# Patient Record
Sex: Male | Born: 1995 | Race: White | Hispanic: No | Marital: Single | Smoking: Never smoker
Health system: Southern US, Community
[De-identification: ages and names within clinical notes are randomized; demographics above are authoritative.]

## PROBLEM LIST (undated history)

## (undated) DIAGNOSIS — C719 Malignant neoplasm of brain, unspecified: Secondary | ICD-10-CM

---

## 2018-10-13 ENCOUNTER — Inpatient Hospital Stay (HOSPITAL_COMMUNITY): Payer: 59 | Admitting: Certified Registered Nurse Anesthetist

## 2018-10-13 ENCOUNTER — Emergency Department (HOSPITAL_COMMUNITY): Payer: 59

## 2018-10-13 ENCOUNTER — Inpatient Hospital Stay (HOSPITAL_COMMUNITY)
Admission: EM | Admit: 2018-10-13 | Discharge: 2018-10-21 | DRG: 025 | Disposition: A | Discharge status: Rehabilitation Facility | Payer: 59 | Attending: Neurological Surgery | Admitting: Neurological Surgery

## 2018-10-13 ENCOUNTER — Inpatient Hospital Stay (HOSPITAL_COMMUNITY): Payer: 59

## 2018-10-13 ENCOUNTER — Encounter (HOSPITAL_COMMUNITY)
Admission: EM | Disposition: A | Discharge status: Rehabilitation Facility | Payer: Self-pay | Source: Home / Self Care | Attending: Neurological Surgery

## 2018-10-13 ENCOUNTER — Encounter (HOSPITAL_COMMUNITY): Payer: Self-pay

## 2018-10-13 DIAGNOSIS — R29810 Facial weakness: Secondary | ICD-10-CM | POA: Diagnosis present

## 2018-10-13 DIAGNOSIS — Z23 Encounter for immunization: Secondary | ICD-10-CM

## 2018-10-13 DIAGNOSIS — G9389 Other specified disorders of brain: Secondary | ICD-10-CM | POA: Diagnosis not present

## 2018-10-13 DIAGNOSIS — Z20828 Contact with and (suspected) exposure to other viral communicable diseases: Secondary | ICD-10-CM | POA: Diagnosis present

## 2018-10-13 DIAGNOSIS — G939 Disorder of brain, unspecified: Secondary | ICD-10-CM | POA: Clinically undetermined

## 2018-10-13 DIAGNOSIS — R471 Dysarthria and anarthria: Secondary | ICD-10-CM | POA: Diagnosis present

## 2018-10-13 DIAGNOSIS — G936 Cerebral edema: Secondary | ICD-10-CM | POA: Diagnosis present

## 2018-10-13 DIAGNOSIS — R519 Headache, unspecified: Secondary | ICD-10-CM | POA: Diagnosis not present

## 2018-10-13 DIAGNOSIS — G8191 Hemiplegia, unspecified affecting right dominant side: Secondary | ICD-10-CM | POA: Diagnosis present

## 2018-10-13 DIAGNOSIS — I629 Nontraumatic intracranial hemorrhage, unspecified: Secondary | ICD-10-CM

## 2018-10-13 DIAGNOSIS — Z79899 Other long term (current) drug therapy: Secondary | ICD-10-CM

## 2018-10-13 DIAGNOSIS — H4902 Third [oculomotor] nerve palsy, left eye: Secondary | ICD-10-CM | POA: Diagnosis present

## 2018-10-13 DIAGNOSIS — R7401 Elevation of levels of liver transaminase levels: Secondary | ICD-10-CM | POA: Diagnosis not present

## 2018-10-13 DIAGNOSIS — R4701 Aphasia: Secondary | ICD-10-CM | POA: Diagnosis present

## 2018-10-13 DIAGNOSIS — I639 Cerebral infarction, unspecified: Secondary | ICD-10-CM | POA: Diagnosis not present

## 2018-10-13 DIAGNOSIS — C711 Malignant neoplasm of frontal lobe: Secondary | ICD-10-CM | POA: Diagnosis not present

## 2018-10-13 DIAGNOSIS — R202 Paresthesia of skin: Secondary | ICD-10-CM | POA: Diagnosis not present

## 2018-10-13 DIAGNOSIS — R066 Hiccough: Secondary | ICD-10-CM | POA: Diagnosis present

## 2018-10-13 DIAGNOSIS — D496 Neoplasm of unspecified behavior of brain: Secondary | ICD-10-CM | POA: Diagnosis not present

## 2018-10-13 DIAGNOSIS — J9601 Acute respiratory failure with hypoxia: Secondary | ICD-10-CM

## 2018-10-13 DIAGNOSIS — E871 Hypo-osmolality and hyponatremia: Secondary | ICD-10-CM | POA: Diagnosis not present

## 2018-10-13 DIAGNOSIS — R739 Hyperglycemia, unspecified: Secondary | ICD-10-CM | POA: Diagnosis not present

## 2018-10-13 DIAGNOSIS — R531 Weakness: Secondary | ICD-10-CM | POA: Diagnosis not present

## 2018-10-13 DIAGNOSIS — I611 Nontraumatic intracerebral hemorrhage in hemisphere, cortical: Secondary | ICD-10-CM | POA: Diagnosis not present

## 2018-10-13 DIAGNOSIS — I619 Nontraumatic intracerebral hemorrhage, unspecified: Secondary | ICD-10-CM | POA: Diagnosis present

## 2018-10-13 DIAGNOSIS — R1310 Dysphagia, unspecified: Secondary | ICD-10-CM | POA: Diagnosis present

## 2018-10-13 DIAGNOSIS — C712 Malignant neoplasm of temporal lobe: Principal | ICD-10-CM | POA: Diagnosis present

## 2018-10-13 DIAGNOSIS — T380X5A Adverse effect of glucocorticoids and synthetic analogues, initial encounter: Secondary | ICD-10-CM | POA: Diagnosis not present

## 2018-10-13 DIAGNOSIS — D72828 Other elevated white blood cell count: Secondary | ICD-10-CM | POA: Diagnosis not present

## 2018-10-13 DIAGNOSIS — R4781 Slurred speech: Secondary | ICD-10-CM | POA: Diagnosis not present

## 2018-10-13 DIAGNOSIS — J9600 Acute respiratory failure, unspecified whether with hypoxia or hypercapnia: Secondary | ICD-10-CM | POA: Diagnosis not present

## 2018-10-13 DIAGNOSIS — I69151 Hemiplegia and hemiparesis following nontraumatic intracerebral hemorrhage affecting right dominant side: Secondary | ICD-10-CM | POA: Diagnosis not present

## 2018-10-13 DIAGNOSIS — Z01818 Encounter for other preprocedural examination: Secondary | ICD-10-CM

## 2018-10-13 DIAGNOSIS — G8110 Spastic hemiplegia affecting unspecified side: Secondary | ICD-10-CM | POA: Diagnosis not present

## 2018-10-13 DIAGNOSIS — M7989 Other specified soft tissue disorders: Secondary | ICD-10-CM | POA: Diagnosis not present

## 2018-10-13 DIAGNOSIS — G8111 Spastic hemiplegia affecting right dominant side: Secondary | ICD-10-CM | POA: Diagnosis present

## 2018-10-13 DIAGNOSIS — R1312 Dysphagia, oropharyngeal phase: Secondary | ICD-10-CM | POA: Diagnosis not present

## 2018-10-13 HISTORY — PX: CRANIOTOMY: SHX93

## 2018-10-13 LAB — POCT I-STAT 7, (LYTES, BLD GAS, ICA,H+H)
Acid-base deficit: 5 mmol/L — ABNORMAL HIGH (ref 0.0–2.0)
Acid-base deficit: 5 mmol/L — ABNORMAL HIGH (ref 0.0–2.0)
Bicarbonate: 19.9 mmol/L — ABNORMAL LOW (ref 20.0–28.0)
Bicarbonate: 18.8 mmol/L — ABNORMAL LOW (ref 20.0–28.0)
Calcium, Ion: 1.22 mmol/L (ref 1.15–1.40)
Calcium, Ion: 1.21 mmol/L (ref 1.15–1.40)
HCT: 35 % — ABNORMAL LOW (ref 39.0–52.0)
HCT: 36 % — ABNORMAL LOW (ref 39.0–52.0)
Hemoglobin: 11.9 g/dL — ABNORMAL LOW (ref 13.0–17.0)
Hemoglobin: 12.2 g/dL — ABNORMAL LOW (ref 13.0–17.0)
O2 Saturation: 100 %
O2 Saturation: 100 %
Patient temperature: 100.1
Patient temperature: 99.5
Potassium: 4.3 mmol/L (ref 3.5–5.1)
Potassium: 4.1 mmol/L (ref 3.5–5.1)
Sodium: 138 mmol/L (ref 135–145)
Sodium: 140 mmol/L (ref 135–145)
TCO2: 21 mmol/L — ABNORMAL LOW (ref 22–32)
TCO2: 20 mmol/L — ABNORMAL LOW (ref 22–32)
pCO2 arterial: 38.8 mmHg (ref 32.0–48.0)
pCO2 arterial: 29.5 mmHg — ABNORMAL LOW (ref 32.0–48.0)
pH, Arterial: 7.322 — ABNORMAL LOW (ref 7.350–7.450)
pH, Arterial: 7.415 (ref 7.350–7.450)
pO2, Arterial: 311 mmHg — ABNORMAL HIGH (ref 83.0–108.0)
pO2, Arterial: 201 mmHg — ABNORMAL HIGH (ref 83.0–108.0)

## 2018-10-13 LAB — ABO/RH: ABO/RH(D): O NEG

## 2018-10-13 LAB — CBC
HCT: 47.6 % (ref 39.0–52.0)
Hemoglobin: 15.9 g/dL (ref 13.0–17.0)
MCH: 29.6 pg (ref 26.0–34.0)
MCHC: 33.4 g/dL (ref 30.0–36.0)
MCV: 88.5 fL (ref 80.0–100.0)
Platelets: 186 10*3/uL (ref 150–400)
RBC: 5.38 MIL/uL (ref 4.22–5.81)
RDW: 12.4 % (ref 11.5–15.5)
WBC: 12.6 10*3/uL — ABNORMAL HIGH (ref 4.0–10.5)
nRBC: 0 % (ref 0.0–0.2)

## 2018-10-13 LAB — COMPREHENSIVE METABOLIC PANEL
ALT: 18 U/L (ref 0–44)
AST: 20 U/L (ref 15–41)
Albumin: 4.1 g/dL (ref 3.5–5.0)
Alkaline Phosphatase: 56 U/L (ref 38–126)
Anion gap: 10 (ref 5–15)
BUN: 20 mg/dL (ref 6–20)
CO2: 26 mmol/L (ref 22–32)
Calcium: 9.1 mg/dL (ref 8.9–10.3)
Chloride: 101 mmol/L (ref 98–111)
Creatinine, Ser: 1.02 mg/dL (ref 0.61–1.24)
GFR calc Af Amer: >60 mL/min (ref >60)
GFR calc non Af Amer: >60 mL/min (ref >60)
Glucose, Bld: 118 mg/dL — ABNORMAL HIGH (ref 70–99)
Potassium: 4.4 mmol/L (ref 3.5–5.1)
Sodium: 137 mmol/L (ref 135–145)
Total Bilirubin: 0.5 mg/dL (ref 0.3–1.2)
Total Protein: 7.3 g/dL (ref 6.5–8.1)

## 2018-10-13 LAB — I-STAT CHEM 8, ED
BUN: 23 mg/dL — ABNORMAL HIGH (ref 6–20)
Calcium, Ion: 1.15 mmol/L (ref 1.15–1.40)
Chloride: 102 mmol/L (ref 98–111)
Creatinine, Ser: 0.9 mg/dL (ref 0.61–1.24)
Glucose, Bld: 110 mg/dL — ABNORMAL HIGH (ref 70–99)
HCT: 48 % (ref 39.0–52.0)
Hemoglobin: 16.3 g/dL (ref 13.0–17.0)
Potassium: 4.3 mmol/L (ref 3.5–5.1)
Sodium: 138 mmol/L (ref 135–145)
TCO2: 25 mmol/L (ref 22–32)

## 2018-10-13 LAB — DIFFERENTIAL
Abs Immature Granulocytes: 0.07 10*3/uL (ref 0.00–0.07)
Basophils Absolute: 0 10*3/uL (ref 0.0–0.1)
Basophils Relative: 0 %
Eosinophils Absolute: 0.1 10*3/uL (ref 0.0–0.5)
Eosinophils Relative: 1 %
Immature Granulocytes: 1 %
Lymphocytes Relative: 9 %
Lymphs Abs: 1.2 10*3/uL (ref 0.7–4.0)
Monocytes Absolute: 0.6 10*3/uL (ref 0.1–1.0)
Monocytes Relative: 5 %
Neutro Abs: 10.7 10*3/uL — ABNORMAL HIGH (ref 1.7–7.7)
Neutrophils Relative %: 84 %

## 2018-10-13 LAB — APTT: aPTT: 25 seconds (ref 24–36)

## 2018-10-13 LAB — PROTIME-INR
INR: 1.1 (ref 0.8–1.2)
Prothrombin Time: 13.7 seconds (ref 11.4–15.2)

## 2018-10-13 LAB — SARS CORONAVIRUS 2 BY RT PCR (HOSPITAL ORDER, PERFORMED IN ~~LOC~~ HOSPITAL LAB): SARS Coronavirus 2: NEGATIVE

## 2018-10-13 LAB — SURGICAL PCR SCREEN
MRSA, PCR: NEGATIVE
Staphylococcus aureus: NEGATIVE

## 2018-10-13 LAB — TYPE AND SCREEN
ABO/RH(D): O NEG
Antibody Screen: NEGATIVE

## 2018-10-13 LAB — ETHANOL: Alcohol, Ethyl (B): <10 mg/dL (ref <10)

## 2018-10-13 LAB — TRIGLYCERIDES: Triglycerides: 31 mg/dL (ref <150)

## 2018-10-13 LAB — CBG MONITORING, ED: Glucose-Capillary: 139 mg/dL — ABNORMAL HIGH (ref 70–99)

## 2018-10-13 SURGERY — CRANIOTOMY HEMATOMA EVACUATION SUBDURAL
Anesthesia: General | Site: Head | Laterality: Left

## 2018-10-13 MED ORDER — PROPOFOL 1000 MG/100ML IV EMUL
5.0000 ug/kg/min | INTRAVENOUS | Status: DC
  Administered 2018-10-13: 10 ug/kg/min via INTRAVENOUS

## 2018-10-13 MED ORDER — LABETALOL HCL 5 MG/ML IV SOLN
10.0000 mg | INTRAVENOUS | Status: DC | PRN
  Administered 2018-10-13: 20 mg via INTRAVENOUS
  Filled 2018-10-13: qty 8

## 2018-10-13 MED ORDER — ONDANSETRON HCL 4 MG PO TABS: 4.0000 mg | ORAL_TABLET | ORAL | Status: DC | PRN

## 2018-10-13 MED ORDER — IOHEXOL 350 MG/ML SOLN
50.0000 mL | Freq: Once | INTRAVENOUS | Status: AC | PRN
  Administered 2018-10-13: 50 mL via INTRAVENOUS

## 2018-10-13 MED ORDER — DEXAMETHASONE SODIUM PHOSPHATE 4 MG/ML IJ SOLN: 4.0000 mg | Freq: Three times a day (TID) | INTRAMUSCULAR | Status: DC

## 2018-10-13 MED ORDER — SODIUM CHLORIDE 0.9 % IV SOLN
INTRAVENOUS | Status: DC
  Administered 2018-10-13: 13:00:00 via INTRAVENOUS

## 2018-10-13 MED ORDER — DEXAMETHASONE SODIUM PHOSPHATE 10 MG/ML IJ SOLN
10.0000 mg | Freq: Once | INTRAMUSCULAR | Status: AC
  Administered 2018-10-13: 10 mg via INTRAVENOUS
  Filled 2018-10-13: qty 1

## 2018-10-13 MED ORDER — ACETAMINOPHEN 10 MG/ML IV SOLN
INTRAVENOUS | Status: AC
  Filled 2018-10-13: qty 100

## 2018-10-13 MED ORDER — SODIUM CHLORIDE 0.9 % IV SOLN
INTRAVENOUS | Status: DC | PRN
  Administered 2018-10-13: 20 ug/min via INTRAVENOUS

## 2018-10-13 MED ORDER — ONDANSETRON HCL 4 MG/2ML IJ SOLN
INTRAMUSCULAR | Status: DC | PRN
  Administered 2018-10-13 (×2): 4 mg via INTRAVENOUS

## 2018-10-13 MED ORDER — SODIUM CHLORIDE 0.9 % IV SOLN: INTRAVENOUS | Status: DC

## 2018-10-13 MED ORDER — FENTANYL 2500MCG IN NS 250ML (10MCG/ML) PREMIX INFUSION
0.0000 ug/h | INTRAVENOUS | Status: DC
  Administered 2018-10-13: 100 ug/h via INTRAVENOUS
  Filled 2018-10-13: qty 250

## 2018-10-13 MED ORDER — PROMETHAZINE HCL 25 MG/ML IJ SOLN
12.5000 mg | Freq: Once | INTRAMUSCULAR | Status: AC
  Administered 2018-10-13: 12.5 mg via INTRAVENOUS
  Filled 2018-10-13: qty 1

## 2018-10-13 MED ORDER — MIDAZOLAM HCL 2 MG/2ML IJ SOLN
INTRAMUSCULAR | Status: AC
  Filled 2018-10-13: qty 2

## 2018-10-13 MED ORDER — MANNITOL 25 % IV SOLN
70.0000 g | Status: AC
  Administered 2018-10-13: 70 g via INTRAVENOUS
  Filled 2018-10-13: qty 280

## 2018-10-13 MED ORDER — FENTANYL CITRATE (PF) 250 MCG/5ML IJ SOLN
INTRAMUSCULAR | Status: AC
  Filled 2018-10-13: qty 5

## 2018-10-13 MED ORDER — ROCURONIUM BROMIDE 10 MG/ML (PF) SYRINGE
PREFILLED_SYRINGE | INTRAVENOUS | Status: AC
  Filled 2018-10-13: qty 10

## 2018-10-13 MED ORDER — PROMETHAZINE HCL 25 MG PO TABS: 12.5000 mg | ORAL_TABLET | ORAL | Status: DC | PRN

## 2018-10-13 MED ORDER — DEXAMETHASONE SODIUM PHOSPHATE 10 MG/ML IJ SOLN
10.0000 mg | Freq: Four times a day (QID) | INTRAMUSCULAR | Status: DC
  Administered 2018-10-13 – 2018-10-15 (×7): 10 mg via INTRAVENOUS
  Filled 2018-10-13 (×6): qty 1

## 2018-10-13 MED ORDER — ROCURONIUM BROMIDE 10 MG/ML (PF) SYRINGE
PREFILLED_SYRINGE | INTRAVENOUS | Status: DC | PRN
  Administered 2018-10-13 (×2): 20 mg via INTRAVENOUS
  Administered 2018-10-13: 40 mg via INTRAVENOUS
  Administered 2018-10-13: 20 mg via INTRAVENOUS

## 2018-10-13 MED ORDER — SODIUM CHLORIDE 0.9 % IV SOLN
INTRAVENOUS | Status: DC | PRN
  Administered 2018-10-13: 500 mL

## 2018-10-13 MED ORDER — CHLORHEXIDINE GLUCONATE 0.12% ORAL RINSE (MEDLINE KIT)
15.0000 mL | Freq: Two times a day (BID) | OROMUCOSAL | Status: DC
  Administered 2018-10-13 – 2018-10-14 (×2): 15 mL via OROMUCOSAL

## 2018-10-13 MED ORDER — SUCCINYLCHOLINE CHLORIDE 200 MG/10ML IV SOSY
PREFILLED_SYRINGE | INTRAVENOUS | Status: DC | PRN
  Administered 2018-10-13: 130 mg via INTRAVENOUS

## 2018-10-13 MED ORDER — PROPOFOL 10 MG/ML IV BOLUS
INTRAVENOUS | Status: DC | PRN
  Administered 2018-10-13: 150 mg via INTRAVENOUS
  Administered 2018-10-13: 50 mg via INTRAVENOUS

## 2018-10-13 MED ORDER — ONDANSETRON HCL 4 MG/2ML IJ SOLN
INTRAMUSCULAR | Status: AC
  Filled 2018-10-13: qty 2

## 2018-10-13 MED ORDER — GADOBUTROL 1 MMOL/ML IV SOLN
7.0000 mL | Freq: Once | INTRAVENOUS | Status: AC | PRN
  Administered 2018-10-13: 7 mL via INTRAVENOUS

## 2018-10-13 MED ORDER — ESMOLOL HCL 100 MG/10ML IV SOLN
INTRAVENOUS | Status: AC
  Filled 2018-10-13: qty 10

## 2018-10-13 MED ORDER — PROPOFOL 10 MG/ML IV BOLUS
INTRAVENOUS | Status: AC
  Filled 2018-10-13: qty 20

## 2018-10-13 MED ORDER — ORAL CARE MOUTH RINSE
15.0000 mL | OROMUCOSAL | Status: DC
  Administered 2018-10-13 – 2018-10-14 (×7): 15 mL via OROMUCOSAL

## 2018-10-13 MED ORDER — THROMBIN 20000 UNITS EX SOLR
CUTANEOUS | Status: AC
  Filled 2018-10-13: qty 20000

## 2018-10-13 MED ORDER — LIDOCAINE 2% (20 MG/ML) 5 ML SYRINGE
INTRAMUSCULAR | Status: AC
  Filled 2018-10-13: qty 5

## 2018-10-13 MED ORDER — THROMBIN 5000 UNITS EX SOLR
CUTANEOUS | Status: AC
  Filled 2018-10-13: qty 5000

## 2018-10-13 MED ORDER — SUCCINYLCHOLINE CHLORIDE 200 MG/10ML IV SOSY
PREFILLED_SYRINGE | INTRAVENOUS | Status: AC
  Filled 2018-10-13: qty 10

## 2018-10-13 MED ORDER — ALBUMIN HUMAN 5 % IV SOLN
INTRAVENOUS | Status: DC | PRN
  Administered 2018-10-13: 17:00:00 via INTRAVENOUS

## 2018-10-13 MED ORDER — BACITRACIN ZINC 500 UNIT/GM EX OINT
TOPICAL_OINTMENT | CUTANEOUS | Status: AC
  Filled 2018-10-13: qty 28.35

## 2018-10-13 MED ORDER — CEFAZOLIN SODIUM-DEXTROSE 2-3 GM-%(50ML) IV SOLR
INTRAVENOUS | Status: DC | PRN
  Administered 2018-10-13: 2 g via INTRAVENOUS

## 2018-10-13 MED ORDER — DEXAMETHASONE SODIUM PHOSPHATE 4 MG/ML IJ SOLN: 4.0000 mg | Freq: Four times a day (QID) | INTRAMUSCULAR | Status: DC

## 2018-10-13 MED ORDER — THROMBIN 5000 UNITS EX SOLR
OROMUCOSAL | Status: DC | PRN
  Administered 2018-10-13: 5 mL via TOPICAL

## 2018-10-13 MED ORDER — SODIUM CHLORIDE 0.9 % IV SOLN
INTRAVENOUS | Status: DC
  Administered 2018-10-13 – 2018-10-17 (×7): via INTRAVENOUS

## 2018-10-13 MED ORDER — CHLORHEXIDINE GLUCONATE CLOTH 2 % EX PADS
6.0000 | MEDICATED_PAD | Freq: Every day | CUTANEOUS | Status: DC
  Administered 2018-10-13 – 2018-10-21 (×7): 6 via TOPICAL

## 2018-10-13 MED ORDER — 0.9 % SODIUM CHLORIDE (POUR BTL) OPTIME
TOPICAL | Status: DC | PRN
  Administered 2018-10-13: 1000 mL

## 2018-10-13 MED ORDER — PROPOFOL 1000 MG/100ML IV EMUL
5.0000 ug/kg/min | INTRAVENOUS | Status: DC
  Administered 2018-10-13 – 2018-10-14 (×2): 10 ug/kg/min via INTRAVENOUS
  Filled 2018-10-13 (×2): qty 100

## 2018-10-13 MED ORDER — CLEVIDIPINE BUTYRATE 0.5 MG/ML IV EMUL
1.0000 mg/h | INTRAVENOUS | Status: DC
  Administered 2018-10-13: 1 mg/h via INTRAVENOUS
  Filled 2018-10-13: qty 50

## 2018-10-13 MED ORDER — ONDANSETRON HCL 4 MG/2ML IJ SOLN
4.0000 mg | INTRAMUSCULAR | Status: DC | PRN
  Administered 2018-10-13: 4 mg via INTRAVENOUS
  Filled 2018-10-13: qty 2

## 2018-10-13 MED ORDER — HYDROMORPHONE HCL 1 MG/ML IJ SOLN
0.5000 mg | INTRAMUSCULAR | Status: DC | PRN
  Filled 2018-10-13: qty 1

## 2018-10-13 MED ORDER — BACITRACIN ZINC 500 UNIT/GM EX OINT
TOPICAL_OINTMENT | CUTANEOUS | Status: DC | PRN
  Administered 2018-10-13: 1 via TOPICAL

## 2018-10-13 MED ORDER — HYDROCODONE-ACETAMINOPHEN 5-325 MG PO TABS: 1.0000 | ORAL_TABLET | ORAL | Status: DC | PRN

## 2018-10-13 MED ORDER — FENTANYL CITRATE (PF) 250 MCG/5ML IJ SOLN
INTRAMUSCULAR | Status: DC | PRN
  Administered 2018-10-13: 100 ug via INTRAVENOUS
  Administered 2018-10-13 (×3): 50 ug via INTRAVENOUS

## 2018-10-13 MED ORDER — ACETAMINOPHEN 650 MG RE SUPP: 650.0000 mg | RECTAL | Status: DC | PRN

## 2018-10-13 MED ORDER — PHENYLEPHRINE 40 MCG/ML (10ML) SYRINGE FOR IV PUSH (FOR BLOOD PRESSURE SUPPORT)
PREFILLED_SYRINGE | INTRAVENOUS | Status: DC | PRN
  Administered 2018-10-13 (×3): 40 ug via INTRAVENOUS
  Administered 2018-10-13 (×3): 80 ug via INTRAVENOUS
  Administered 2018-10-13: 40 ug via INTRAVENOUS

## 2018-10-13 MED ORDER — ESMOLOL HCL 100 MG/10ML IV SOLN
INTRAVENOUS | Status: DC | PRN
  Administered 2018-10-13: 20 mg via INTRAVENOUS
  Administered 2018-10-13: 30 mg via INTRAVENOUS

## 2018-10-13 MED ORDER — LIDOCAINE-EPINEPHRINE 1 %-1:100000 IJ SOLN
INTRAMUSCULAR | Status: DC | PRN
  Administered 2018-10-13: 8 mL

## 2018-10-13 MED ORDER — SUGAMMADEX SODIUM 200 MG/2ML IV SOLN
INTRAVENOUS | Status: DC | PRN
  Administered 2018-10-13: 150 mg via INTRAVENOUS

## 2018-10-13 MED ORDER — LIDOCAINE-EPINEPHRINE 1 %-1:100000 IJ SOLN
INTRAMUSCULAR | Status: AC
  Filled 2018-10-13: qty 1

## 2018-10-13 MED ORDER — DEXAMETHASONE SODIUM PHOSPHATE 10 MG/ML IJ SOLN
INTRAMUSCULAR | Status: DC | PRN
  Administered 2018-10-13: 10 mg via INTRAVENOUS

## 2018-10-13 MED ORDER — MUPIROCIN 2 % EX OINT: 1.0000 "application " | TOPICAL_OINTMENT | Freq: Two times a day (BID) | CUTANEOUS | Status: DC

## 2018-10-13 MED ORDER — DEXAMETHASONE SODIUM PHOSPHATE 10 MG/ML IJ SOLN
10.0000 mg | Freq: Four times a day (QID) | INTRAMUSCULAR | Status: DC
  Filled 2018-10-13: qty 1

## 2018-10-13 MED ORDER — DEXAMETHASONE SODIUM PHOSPHATE 10 MG/ML IJ SOLN
6.0000 mg | Freq: Four times a day (QID) | INTRAMUSCULAR | Status: DC
  Administered 2018-10-13: 6 mg via INTRAVENOUS
  Filled 2018-10-13: qty 1

## 2018-10-13 MED ORDER — DOCUSATE SODIUM 100 MG PO CAPS
100.0000 mg | ORAL_CAPSULE | Freq: Two times a day (BID) | ORAL | Status: DC
  Administered 2018-10-15 – 2018-10-19 (×8): 100 mg via ORAL
  Filled 2018-10-13 (×7): qty 1

## 2018-10-13 MED ORDER — ACETAMINOPHEN 10 MG/ML IV SOLN
INTRAVENOUS | Status: DC | PRN
  Administered 2018-10-13: 1000 mg via INTRAVENOUS

## 2018-10-13 MED ORDER — IOHEXOL 300 MG/ML  SOLN
80.0000 mL | Freq: Once | INTRAMUSCULAR | Status: AC | PRN
  Administered 2018-10-13: 80 mL via INTRAVENOUS

## 2018-10-13 MED ORDER — LIDOCAINE 2% (20 MG/ML) 5 ML SYRINGE
INTRAMUSCULAR | Status: DC | PRN
  Administered 2018-10-13: 80 mg via INTRAVENOUS

## 2018-10-13 MED ORDER — HEMOSTATIC AGENTS (NO CHARGE) OPTIME
TOPICAL | Status: DC | PRN
  Administered 2018-10-13: 1 via TOPICAL

## 2018-10-13 MED ORDER — THROMBIN 20000 UNITS EX SOLR
CUTANEOUS | Status: DC | PRN
  Administered 2018-10-13: 20 mL via TOPICAL

## 2018-10-13 MED ORDER — PANTOPRAZOLE SODIUM 40 MG IV SOLR
40.0000 mg | INTRAVENOUS | Status: DC
  Administered 2018-10-14 – 2018-10-21 (×8): 40 mg via INTRAVENOUS
  Filled 2018-10-13 (×8): qty 40

## 2018-10-13 MED ORDER — ACETAMINOPHEN 325 MG PO TABS
650.0000 mg | ORAL_TABLET | ORAL | Status: DC | PRN
  Administered 2018-10-20: 650 mg via ORAL
  Filled 2018-10-13: qty 2

## 2018-10-13 SURGICAL SUPPLY — 54 items
BLADE CLIPPER SURG (BLADE) ×3 IMPLANT
BUR ACORN 9.0 PRECISION (BURR) ×3 IMPLANT
BUR SPIRAL ROUTER 2.3 (BUR) ×3 IMPLANT
CANISTER SUCT 3000ML PPV (MISCELLANEOUS) ×5 IMPLANT
DRAPE NEUROLOGICAL W/INCISE (DRAPES) ×3 IMPLANT
DRAPE SHEET LG 3/4 BI-LAMINATE (DRAPES) ×3 IMPLANT
DRAPE WARM FLUID 44X44 (DRAPES) ×3 IMPLANT
DURAPREP 6ML APPLICATOR 50/CS (WOUND CARE) ×3 IMPLANT
ELECT REM PT RETURN 9FT ADLT (ELECTROSURGICAL) ×3
ELECTRODE REM PT RTRN 9FT ADLT (ELECTROSURGICAL) ×2 IMPLANT
GAUZE SPONGE 4X4 12PLY STRL (GAUZE/BANDAGES/DRESSINGS) ×3 IMPLANT
GLOVE BIO SURGEON STRL SZ7.5 (GLOVE) ×6 IMPLANT
GLOVE BIOGEL PI IND STRL 7.5 (GLOVE) ×4 IMPLANT
GLOVE BIOGEL PI INDICATOR 7.5 (GLOVE) ×2
GOWN STRL REUS W/ TWL LRG LVL3 (GOWN DISPOSABLE) ×4 IMPLANT
GOWN STRL REUS W/ TWL XL LVL3 (GOWN DISPOSABLE) ×2 IMPLANT
GOWN STRL REUS W/TWL 2XL LVL3 (GOWN DISPOSABLE) IMPLANT
GOWN STRL REUS W/TWL LRG LVL3 (GOWN DISPOSABLE) ×6
GOWN STRL REUS W/TWL XL LVL3 (GOWN DISPOSABLE) ×6
HEMOSTAT POWDER KIT SURGIFOAM (HEMOSTASIS) ×3 IMPLANT
HEMOSTAT SURGICEL 2X14 (HEMOSTASIS) ×2 IMPLANT
HOOK DURA 1/2IN (MISCELLANEOUS) ×3 IMPLANT
KIT BASIN OR (CUSTOM PROCEDURE TRAY) ×3 IMPLANT
KIT TURNOVER KIT B (KITS) ×3 IMPLANT
MARKER SPHERE PSV REFLC 13MM (MARKER) ×6 IMPLANT
NEEDLE HYPO 22GX1.5 SAFETY (NEEDLE) ×3 IMPLANT
NS IRRIG 1000ML POUR BTL (IV SOLUTION) ×3 IMPLANT
PACK CRANIOTOMY CUSTOM (CUSTOM PROCEDURE TRAY) ×3 IMPLANT
PATTIES SURGICAL .5 X.5 (GAUZE/BANDAGES/DRESSINGS) IMPLANT
PATTIES SURGICAL .5 X3 (DISPOSABLE) IMPLANT
PATTIES SURGICAL 1X1 (DISPOSABLE) IMPLANT
PLATE 1.5/0.5 18.5MM BURR HOLE (Plate) ×4 IMPLANT
PLATE DOUBLE 6 HOLE (Plate) ×2 IMPLANT
SCREW SELF DRILL HT 1.5/4MM (Screw) ×28 IMPLANT
SPONGE NEURO XRAY DETECT 1X3 (DISPOSABLE) IMPLANT
SPONGE SURGIFOAM ABS GEL 100 (HEMOSTASIS) ×3 IMPLANT
STAPLER VISISTAT 35W (STAPLE) ×3 IMPLANT
STOCKINETTE 6  STRL (DRAPES) ×1
STOCKINETTE 6 STRL (DRAPES) ×2 IMPLANT
SUT ETHILON 3 0 FSL (SUTURE) IMPLANT
SUT ETHILON 3 0 PS 1 (SUTURE) IMPLANT
SUT NURALON 4 0 TR CR/8 (SUTURE) ×5 IMPLANT
SUT STEEL 0 (SUTURE)
SUT STEEL 0 18XMFL TIE 17 (SUTURE) IMPLANT
SUT VIC AB 0 CT1 18XCR BRD8 (SUTURE) ×2 IMPLANT
SUT VIC AB 0 CT1 8-18 (SUTURE)
SUT VIC AB 2-0 CP2 18 (SUTURE) ×4 IMPLANT
SUT VIC AB 3-0 SH 8-18 (SUTURE) ×2 IMPLANT
TOWEL GREEN STERILE (TOWEL DISPOSABLE) ×3 IMPLANT
TOWEL GREEN STERILE FF (TOWEL DISPOSABLE) ×3 IMPLANT
TRAY FOLEY MTR SLVR 16FR STAT (SET/KITS/TRAYS/PACK) ×3 IMPLANT
TUBE CONNECTING 12X1/4 (SUCTIONS) ×3 IMPLANT
UNDERPAD 30X30 (UNDERPADS AND DIAPERS) ×3 IMPLANT
WATER STERILE IRR 1000ML POUR (IV SOLUTION) ×3 IMPLANT

## 2018-10-13 NOTE — H&P (Signed)
Neurosurgery H&P  CC: Headache  HPI: This is a 23 y.o. man that presents with acute onset headache then right sided weakness this morning at ~05:00. His weakness has continued to progress and his speech has worsened. History provided from his mother due to patient's speech limitations. He had some occasional headaches in the past few weeks, otherwise no complaints. No recent use of anti-platelet or anti-coagulant medications, he took an ibuprofen this morning but vomited it back up. Last food was dinner last night, one sip of water this morning with the ibuprofen.    ROS: A 14 point ROS was performed and is negative except as noted in the HPI.   PMHx: No past medical history on file. FamHx: No family history on file. SocHx:  has no history on file for tobacco, alcohol, and drug.  Exam: Vital signs in last 24 hours: Temp:  [98.1 F (36.7 C)] 98.1 F (36.7 C) (09/28 0618) Pulse Rate:  [20-92] 92 (09/28 0830) Resp:  [15-95] 24 (09/28 0830) BP: (129-148)/(72-95) 148/95 (09/28 0830) SpO2:  [97 %-100 %] 97 % (09/28 0830) General: Awake, alert, cooperative, lying in bed in NAD Head: normocephalic and atruamatic HEENT: neck supple Pulmonary: breathing room air comfortably, no evidence of increased work of breathing Cardiac: RRR Abdomen: S NT ND Extremities: warm and well perfused x4 Neuro: Somnolent but arousable, speech dysarthric with partially impaired comprehension, fluent with abnormal content, Ox1, PERRL, +L partial CN3 palsy, +R UMN pattern facial weakness Strength 0/5 on R, 5/5 on L, unable to reliably test sensation on the right, intact on the left   Assessment and Plan: 23 y.o. man with acute onset headache and R sided weakness. 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 Given his age group and temporal location, could be PXA/GG/oligo, HGG less likely.  -keep NPO -Discussed the above with patient's mother. He will need a tissue diagnosis. It will take weeks for the clot to dissolve to get better imaging of the region. Given his acute significant deficit, including a developing CN3 palsy, I think the best course of action is to take him to the OR today to remove the hematoma and resect any abnormal tissue to send for pathology.  -CT CAP to r/o metastatic process like choriocarcinoma/etc given age group and hemorrhage -assuming CT CAP is negative, will take to the OR for craniotomy for tumor resection and hemorrhage evacuation -please call with any concerns or questions  Judith Part, MD 10/13/18 9:26 AM [facility]

## 2018-10-13 NOTE — Anesthesia Procedure Notes (Signed)
Arterial Line Insertion Start/End9/28/2020 2:54 PM, 10/13/2018 2:58 PM Performed by: Audry Pili, MD, anesthesiologist  Patient location: Pre-op. Preanesthetic checklist: patient identified, IV checked, risks and benefits discussed, surgical consent, monitors and equipment checked, pre-op evaluation, timeout performed and anesthesia consent Lidocaine 1% used for infiltration and patient sedated Right, radial was placed Catheter size: 20 G Hand hygiene performed   Attempts: 2 (Previous attempts on left radial by CRNA unsuccessful) Procedure performed without using ultrasound guided technique. Following insertion, dressing applied and Biopatch. Post procedure assessment: unchanged and normal  Patient tolerated the procedure well with no immediate complications.

## 2018-10-13 NOTE — Progress Notes (Signed)
Pt belongings include:  1 set of keys (5 total keys) 1 pair shoes 1 pair jeans w/ belt 1 t-shirt 1 pair socks  Inventoried w/ Gilford Rile, RN

## 2018-10-13 NOTE — ED Notes (Signed)
Pt and RN to room after going to both CT and MRI.

## 2018-10-13 NOTE — ED Provider Notes (Signed)
Emergency Department Provider Note   I have reviewed the triage vital signs and the nursing notes.   HISTORY  Chief Complaint Headache   HPI ONTERRIO RULEY is a 23 y.o. male patient presents the emergency department today secondary to a headache.  Patient states it woke him from sleep when it started about an hour ago.  Patient not able to offer much more history.  Patient states that his left face feels numb and his left side of his head hurts but he touches his right mandible and states that his leg is numb as well.  When asked to clarify he does the same thing again.  He is confused when asked about orientation questions.   No other associated or modifying symptoms.    No past medical history on file.  There are no active problems to display for this patient.    The histories are not reviewed yet. Please review them in the "History" navigator section and refresh this [place].    Allergies Patient has no known allergies.  No family history on file.  Social History Social History   Tobacco Use   Smoking status: Not on file  Substance Use Topics   Alcohol use: Not on file   Drug use: Not on file    Review of Systems  All other systems negative except as documented in the HPI. All pertinent positives and negatives as reviewed in the HPI. ____________________________________________   PHYSICAL EXAM:  VITAL SIGNS: ED Triage Vitals [10/13/18 0618]  Enc Vitals Group     BP 132/80     Pulse Rate (!) 20     Resp (!) 95     Temp 98.1 F (36.7 C)     Temp Source Oral     SpO2 100 %    Constitutional: Alert and oriented. Well appearing and in no acute distress. Eyes: Conjunctivae are normal. PERRL. EOMI. Head: Atraumatic. Nose: No congestion/rhinnorhea. Mouth/Throat: Mucous membranes are moist.  Oropharynx non-erythematous. Neck: No stridor.  No meningeal signs.   Cardiovascular: Normal rate, regular rhythm. Good peripheral circulation. Grossly  normal heart sounds.   Respiratory: Normal respiratory effort.  No retractions. Lungs CTAB. Gastrointestinal: Soft and nontender. No distention.  Musculoskeletal: No lower extremity tenderness nor edema. No gross deformities of extremities. Neurologic: No cranial nerve deficits however does have significant confusion, right grip strength weakness, right pronator drift, right visual lateral field cut in his right eye. Skin:  Skin is warm, dry and intact. No rash noted.  ____________________________________________   LABS (all labs ordered are listed, but only abnormal results are displayed)  Labs Reviewed  CBG MONITORING, ED - Abnormal; Notable for the following components:      Result Value   Glucose-Capillary 139 (*)    All other components within normal limits  ETHANOL  PROTIME-INR  APTT  CBC  DIFFERENTIAL  COMPREHENSIVE METABOLIC PANEL  RAPID URINE DRUG SCREEN, HOSP PERFORMED  URINALYSIS, ROUTINE W REFLEX MICROSCOPIC  I-STAT CHEM 8, ED   ____________________________________________  EKG   EKG Interpretation  Date/Time:  Monday October 13 2018 07:49:56 EDT Ventricular Rate:  60 PR Interval:    QRS Duration: 90 QT Interval:  405 QTC Calculation: 405 R Axis:   82 Text Interpretation:  Sinus rhythm LVH by voltage No old tracing to compare Confirmed by Merrily Pew [phone]) on 10/14/2018 2:20:59 AM       ____________________________________________  RADIOLOGY  Ct Angio Head W Or Wo Contrast  Result Date: 10/13/2018 CLINICAL DATA:  Cerebral hemorrhage EXAM: CT ANGIOGRAPHY HEAD AND NECK TECHNIQUE: Multidetector CT imaging of the head and neck was performed using the standard protocol during bolus administration of intravenous contrast. Multiplanar CT image reconstructions and MIPs were obtained to evaluate the vascular anatomy. Carotid stenosis measurements (when applicable) are obtained utilizing NASCET criteria, using the distal internal carotid diameter as the  denominator. CONTRAST:  28mL OMNIPAQUE IOHEXOL 350 MG/ML SOLN COMPARISON:  Head CT earlier today FINDINGS: CTA NECK FINDINGS Aortic arch: Normal Right carotid system: Normal when accounting for mild motion. Left carotid system: Normal when accounting for mild motion Vertebral arteries: Normal Skeleton: Negative Other neck: Negative Upper chest: Clear apical lungs Review of the MIP images confirms the above findings CTA HEAD FINDINGS Anterior circulation: Vessels are smooth and widely patent. Hypoplastic right A1 segment. Negative for aneurysm or vascular malformation. No convincing dot sign or abnormal vessels within the hematoma. Proximal left MCA vessels are displaced superiorly Posterior circulation: Vessels are smooth and widely patent. No branch occlusion or aneurysm. Venous sinuses: Patent as permitted by contrast timing. Anatomic variants: As above Delayed phase: When densitometry of the periphery is compared to the noncontrast study there is no definite enhancement. Brain MRI with contrast is currently underway. Review of the MIP images confirms the above findings IMPRESSION: No vascular explanation for the left temporal hematoma. No aneurysm or visible vascular malformation. Major dural sinuses are patent. Electronically Signed   By: Monte Fantasia M.D.   On: 10/13/2018 07:17   Ct Angio Neck W Or Wo Contrast  Result Date: 10/13/2018 CLINICAL DATA:  Cerebral hemorrhage EXAM: CT ANGIOGRAPHY HEAD AND NECK TECHNIQUE: Multidetector CT imaging of the head and neck was performed using the standard protocol during bolus administration of intravenous contrast. Multiplanar CT image reconstructions and MIPs were obtained to evaluate the vascular anatomy. Carotid stenosis measurements (when applicable) are obtained utilizing NASCET criteria, using the distal internal carotid diameter as the denominator. CONTRAST:  58mL OMNIPAQUE IOHEXOL 350 MG/ML SOLN COMPARISON:  Head CT earlier today FINDINGS: CTA NECK FINDINGS  Aortic arch: Normal Right carotid system: Normal when accounting for mild motion. Left carotid system: Normal when accounting for mild motion Vertebral arteries: Normal Skeleton: Negative Other neck: Negative Upper chest: Clear apical lungs Review of the MIP images confirms the above findings CTA HEAD FINDINGS Anterior circulation: Vessels are smooth and widely patent. Hypoplastic right A1 segment. Negative for aneurysm or vascular malformation. No convincing dot sign or abnormal vessels within the hematoma. Proximal left MCA vessels are displaced superiorly Posterior circulation: Vessels are smooth and widely patent. No branch occlusion or aneurysm. Venous sinuses: Patent as permitted by contrast timing. Anatomic variants: As above Delayed phase: When densitometry of the periphery is compared to the noncontrast study there is no definite enhancement. Brain MRI with contrast is currently underway. Review of the MIP images confirms the above findings IMPRESSION: No vascular explanation for the left temporal hematoma. No aneurysm or visible vascular malformation. Major dural sinuses are patent. Electronically Signed   By: Monte Fantasia M.D.   On: 10/13/2018 07:17   Ct Chest W Contrast  Result Date: 10/13/2018 CLINICAL DATA:  Hemorrhagic left temporal lobe brain mass. Staging evaluation. EXAM: CT CHEST, ABDOMEN, AND PELVIS WITH CONTRAST TECHNIQUE: Multidetector CT imaging of the chest, abdomen and pelvis was performed following the standard protocol during bolus administration of intravenous contrast. CONTRAST:  74mL OMNIPAQUE IOHEXOL 300 MG/ML  SOLN COMPARISON:  None. FINDINGS: CT CHEST FINDINGS Cardiovascular: Normal heart size. No significant pericardial effusion/thickening. [employer]  vessels are normal in course and caliber. No central pulmonary emboli. Mediastinum/Nodes: No discrete thyroid nodules. Unremarkable esophagus. No pathologically enlarged axillary, mediastinal or hilar lymph nodes. Triangular soft  tissue with stippled internal fat in the anterior mediastinum is compatible with atrophic thymic tissue. Lungs/Pleura: No pneumothorax. No pleural effusion. No acute consolidative airspace disease, lung masses or significant pulmonary nodules. Musculoskeletal:  No aggressive appearing focal osseous lesions. CT ABDOMEN PELVIS FINDINGS Hepatobiliary: Normal liver with no liver mass. Normal gallbladder with no radiopaque cholelithiasis. No biliary ductal dilatation. Pancreas: Normal, with no mass or duct dilation. Spleen: Normal size. No mass. Adrenals/Urinary Tract: Normal adrenals. Normal kidneys with no hydronephrosis and no renal mass. Normal bladder. Stomach/Bowel: Normal non-distended stomach. Normal caliber small bowel with no small bowel wall thickening. Normal appendix. There is questionable segmental wall thickening in the collapsed ascending colon (series 3/image 84). Otherwise normal large bowel, with no diverticulosis or acute pericolonic fat stranding. Vascular/Lymphatic: Normal caliber abdominal aorta. Patent portal, splenic, hepatic and renal veins. No pathologically enlarged lymph nodes in the abdomen or pelvis. Reproductive: Normal size prostate. Other: No pneumoperitoneum, ascites or focal fluid collection. Musculoskeletal: No aggressive appearing focal osseous lesions. IMPRESSION: 1. Questionable nonspecific segmental wall thickening in the collapsed ascending colon, poorly evaluated without oral contrast. This finding is most likely artifactual due to underdistention given the patient's age. A follow-up CT abdomen/pelvis with oral and IV contrast may be considered when clinically feasible. If the patient has risk factors for colonic neoplasm, colonoscopy correlation may be considered. 2. Otherwise, no lymphadenopathy or other findings suspicious for neoplastic disease in the chest, abdomen or pelvis. Electronically Signed   By: Ilona Sorrel M.D.   On: 10/13/2018 10:19   Mr Jeri Cos [id number]  Contrast  Result Date: 10/13/2018 CLINICAL DATA:  Cerebral hemorrhage. EXAM: MRI HEAD WITHOUT AND WITH CONTRAST TECHNIQUE: Multiplanar, multiecho pulse sequences of the brain and surrounding structures were obtained without and with intravenous contrast. CONTRAST:  60mL GADAVIST GADOBUTROL 1 MMOL/ML IV SOLN COMPARISON:  None. FINDINGS: Brain: Known hematoma in the left temporal lobe with heterogeneous appearance from pockets of blood and soft tissue. The hemorrhagic area measures up to 4 cm in diameter, stable from prior. There is a rim of T2 hyper to isointense soft tissue which show signs of dense cellularity on diffusion imaging. Wispy enhancement is seen at intermittently along the posterior margin of the abnormality and at the anterior aspect of the hematoma. Even when accounting for compression by local mass effect the cortex of the temporal lobe appears blurred and thickened, especially inferiorly. This infiltrative appearance favors a glioma. No second lesion is seen and there is no history of malignancy. Although there is enhancement is not a typical pattern of necrotic rim enhancement around the hemorrhagic area, and this could reflect a low-grade glioma. Would also expect more enhancement for a solitary metastasis. PXA and ganglioglioma are considered given the mild enhancement, location, and age. There is no cyst with nodule for these entities but this morphology could be obscured by the acute hemorrhage. PXA is also considered less likely given the absence of dural thickening. Oligodendroglioma can have this type of non necrotic enhancement pattern. No reported history of chronic seizure and no parenchymal calcifications by CT. No visible cortical vein thrombus. Midline shift measures up to 7 mm. No herniation, infarct, or hydrocephalus. Vascular: Major flow voids and vascular enhancements are preserved, including the dural venous sinuses. 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 Given age and the mild enhancement pattern question an enhancing low-grade glioma, as above. Biopsy targeting may be more accurate after the hematoma has diminished. Electronically Signed   By: Monte Fantasia M.D.   On: 10/13/2018 08:31   Ct Abdomen Pelvis W Contrast  Result Date: 10/13/2018 CLINICAL DATA:  Hemorrhagic left temporal lobe brain mass. Staging evaluation. EXAM: CT CHEST, ABDOMEN, AND PELVIS WITH CONTRAST TECHNIQUE: Multidetector CT imaging of the chest, abdomen and pelvis was performed following the standard protocol during bolus administration of intravenous contrast. CONTRAST:  56mL OMNIPAQUE IOHEXOL 300 MG/ML  SOLN COMPARISON:  None. FINDINGS: CT CHEST FINDINGS Cardiovascular: Normal heart size. No significant pericardial effusion/thickening. Great vessels are normal in course and caliber. No central pulmonary emboli. Mediastinum/Nodes: No discrete thyroid nodules. Unremarkable esophagus. No pathologically enlarged axillary, mediastinal or hilar lymph nodes. Triangular soft tissue with stippled internal fat in the anterior mediastinum is compatible with atrophic thymic tissue. Lungs/Pleura: No pneumothorax. No pleural effusion. No acute consolidative airspace disease, lung masses or significant pulmonary nodules. Musculoskeletal:  No aggressive appearing focal osseous lesions. CT ABDOMEN PELVIS FINDINGS Hepatobiliary: Normal liver with no liver mass. Normal gallbladder with no radiopaque cholelithiasis. No biliary ductal dilatation. Pancreas: Normal, with no mass or duct dilation. Spleen: Normal size. No mass. Adrenals/Urinary Tract: Normal adrenals. Normal kidneys with no hydronephrosis and no renal mass. Normal bladder. Stomach/Bowel: Normal non-distended stomach. Normal caliber small bowel with no small bowel wall thickening. Normal appendix. There is  questionable segmental wall thickening in the collapsed ascending colon (series 3/image 84). Otherwise normal large bowel, with no diverticulosis or acute pericolonic fat stranding. Vascular/Lymphatic: Normal caliber abdominal aorta. Patent portal, splenic, hepatic and renal veins. No pathologically enlarged lymph nodes in the abdomen or pelvis. Reproductive: Normal size prostate. Other: No pneumoperitoneum, ascites or focal fluid collection. Musculoskeletal: No aggressive appearing focal osseous lesions. IMPRESSION: 1. Questionable nonspecific segmental wall thickening in the collapsed ascending colon, poorly evaluated without oral contrast. This finding is most likely artifactual due to underdistention given the patient's age. A follow-up CT abdomen/pelvis with oral and IV contrast may be considered when clinically feasible. If the patient has risk factors for colonic neoplasm, colonoscopy correlation may be considered. 2. Otherwise, no lymphadenopathy or other findings suspicious for neoplastic disease in the chest, abdomen or pelvis. Electronically Signed   By: Ilona Sorrel M.D.   On: 10/13/2018 10:19   Dg Chest Port 1 View  Result Date: 10/13/2018 CLINICAL DATA:  Intubation. EXAM: PORTABLE CHEST 1 VIEW COMPARISON:  None. FINDINGS: An ETT terminates 6.3 cm above the carina and 6.7 cm below the thoracic inlet, in good position. The lungs are clear. The cardiomediastinal silhouette is unremarkable. No pneumothorax. IMPRESSION: The ETT is in good position.  No other abnormalities. Electronically Signed   By: Dorise Bullion III M.D   On: 10/13/2018 20:07   Ct Head Code Stroke Wo Contrast  Result Date: 10/13/2018 CLINICAL DATA:  Code stroke. Sudden onset left-sided headache with right facial droop and right-sided weakness EXAM: CT HEAD WITHOUT CONTRAST TECHNIQUE: Contiguous axial images were obtained from the base of the skull through the vertex without intravenous contrast. COMPARISON:  None. FINDINGS:  Brain: Patchy high-density hematoma within the left temporal lobe with upward mass effect on the basal ganglia. High-density hematoma portion measures up to 4.4 cm (this measurement includes areas of non high-density parenchyma or un clotted blood). There is a rim of high density which  encircles the hematoma and may reflect an underlying mass or subacute hematoma. The entire abnormality measures up to 6 cm in diameter. Local mass effect with midline shift measuring up to 4 mm. No entrapment. Vascular: No hyperdense vessel or unexpected calcification. Skull: Normal. Negative for fracture or focal lesion. Sinuses/Orbits: No acute finding. Other: Critical Value/emergent results were called by telephone at the time of interpretation on 10/13/2018 at 6:49 am to providerArora, who verbally acknowledged these results. ASPECTS [facility] Stroke Program Early CT Score) Not scored in this setting IMPRESSION: 1. Acute hematoma in the left temporal lobe which appears encapsulated by a high-density rim, suspected underlying mass or subacute hemorrhage. CTA and brain MRI are pending. 2. 4 mm of midline shift. Electronically Signed   By: Monte Fantasia M.D.   On: 10/13/2018 06:52    ____________________________________________   PROCEDURES  Procedure(s) performed:   .Critical Care Performed by: Merrily Pew, MD Authorized by: Merrily Pew, MD   Critical care provider statement:    Critical care time (minutes):  45   Critical care was necessary to treat or prevent imminent or life-threatening deterioration of the following conditions:  CNS failure or compromise   Critical care was time spent personally by me on the following activities:  Discussions with consultants, evaluation of patient's response to treatment, examination of patient, ordering and performing treatments and interventions, ordering and review of laboratory studies, ordering and review of radiographic studies, pulse oximetry, re-evaluation of  patient's condition, obtaining history from patient or surrogate and review of old charts     ____________________________________________   INITIAL IMPRESSION / [place] / ED COURSE  Patient with symptoms concerning for intracranial process, code stroke activated.  Dr. Rory Percy consulted.  Ct with evidence of [place] query aneurysm vs underlying mass.   CTA with no e/o aneurysm, plan for MRI.   Care transferred pending MRI and either NSG or IR consultation depending on results and subsequent admission. Patient with aphasia and worsening hemiplegia but still able to protect airway at time of transfer pending MR.   Pertinent labs & imaging results that were available during my care of the patient were reviewed by me and considered in my medical decision making (see chart for details).  FINAL CLINICAL IMPRESSION(S) / ED DIAGNOSES  Final diagnoses:  None     MEDICATIONS GIVEN DURING THIS VISIT:  Medications - No data to display   NEW OUTPATIENT MEDICATIONS STARTED DURING THIS VISIT:  New Prescriptions   No medications on file    Note:  This note was prepared with assistance of Dragon voice recognition software. Occasional wrong-word or sound-a-like substitutions may have occurred due to the inherent limitations of voice recognition software.   Javarus Dorner, Corene Cornea, MD 10/14/18 [phone]

## 2018-10-13 NOTE — Consult Note (Addendum)
NAME:  Gregory Moreno, MRN:  [id number], DOB:  Jun 18, 1995, LOS: 0 ADMISSION DATE:  10/13/2018, CONSULTATION DATE:  10/13/2018 REFERRING MD:  Dr. 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 On arrival to ED appeared confused, noted right sided weakness. Code Stroke Activated. CT Head/MRI with hematoma in left temporal lobe with encapsulation of a high density rim suspicious for a underlying mass. Neurosurgery consulted. Taken to OR for craniotomy for tumor resection and hemorrhage evacuation. PCCM consulted post-operatively for vent management.    Past Medical History  [facility] Events   9/28 > OR for Left Craniotomy with tumor resection  Consults:  Neurology Neurosurgery  PCCM   Procedures:  ETT 9/28 >>   Significant Diagnostic Tests:  CT Head 9/28 > 1. Acute hematoma in the left temporal lobe which appears encapsulated by a high-density rim, suspected underlying mass or subacute hemorrhage. CTA and brain MRI are pending. 2. 4 mm of midline shift. CTA Head/Neck 9/28 > No vascular explanation for the left temporal hematoma. No aneurysm or visible vascular malformation. Major dural sinuses are patent. 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 Given age and the mild enhancement pattern question an enhancing low-grade glioma, as above. Biopsy targeting may be more accurate after the hematoma has diminished. CT Chest/A/P 9/28 > 1. Questionable nonspecific segmental wall thickening in the collapsed ascending colon, poorly evaluated without oral contrast. This finding is most likely artifactual due to underdistention given the patient's age. A follow-up CT abdomen/pelvis with oral and IV contrast may be considered when clinically feasible. If the patient has risk factors  for colonic neoplasm, colonoscopy correlation may be considered. 2. Otherwise, no lymphadenopathy or other findings suspicious for neoplastic disease in the chest, abdomen or pelvis.  Micro Data:  MRSA 9/28 > Negative Staph 9/28 > Negative   Antimicrobials:  N/A   Interim history/subjective:  As above   Objective   Blood pressure 119/70, pulse 92, temperature 98.1 F (36.7 C), temperature source Oral, resp. rate 12, weight 71.8 kg, SpO2 100 %.    Vent Mode: PRVC FiO2 (%):  [40 %-60 %] 40 % Set Rate:  [12 bmp] 12 bmp Vt Set:  [550 mL] 550 mL PEEP:  [5 cmH20] 5 cmH20 Plateau Pressure:  [13 cmH20] 13 cmH20   Intake/Output Summary (Last 24 hours) at 10/13/2018 1956 Last data filed at 10/13/2018 1754 Gross per 24 hour  Intake 1900 ml  Output 1975 ml  Net -75 ml   Filed Weights   10/13/18 1038  Weight: 71.8 kg    Examination: General: young adult male on vent  HENT: ETT in place, surgical site to left lateral skull   Lungs: Clear breath sounds, no wheeze/crackles  Cardiovascular: RRR, no MRG Abdomen: non-distended, active bowel sounds  Extremities: -edema  Neuro: sedated, left pupil 5 mm, right pupil 2 mm non-reactive GU: foley in place   Resolved Hospital Problem list     Assessment & Plan:   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 and evacuation of hematoma  Plan -Per Neurosurgery  -Scheduled Decadron  -Repeat Head CT at 0000 -Follow Pathology Report  Respiratory Insufficieny in post-operative setting  Plan -Vent Support  -Trend ABG/CXR >> Obtain CXR now for ETT placement  -  Propofol/Fentanyl gtt titration for RASS goal -2/-3   Presumed Reactive Leucocytosis Plan  -Trend WBC and Fever Curve      Best practice:  Diet: NPO Pain/Anxiety/Delirium protocol VAP protocol DVT prophylaxis: hold in setting of Hemorrhage  GI prophylaxis: PPI Glucose control: Trend Glucose  Mobility: Bedrest Code Status:  FC Family Communication: Mother at bedside  Disposition:   Labs   CBC: Recent Labs  Lab 10/13/18 0630 10/13/18 0818 10/13/18 1936  WBC 12.6*  --   --   NEUTROABS 10.7*  --   --   HGB 15.9 16.3 11.9*  HCT 47.6 48.0 35.0*  MCV 88.5  --   --   PLT 186  --   --     Basic Metabolic Panel: Recent Labs  Lab 10/13/18 0630 10/13/18 0818 10/13/18 1936  NA 137 138 138  K 4.4 4.3 4.3  CL 101 102  --   CO2 26  --   --   GLUCOSE 118* 110*  --   BUN 20 23*  --   CREATININE 1.02 0.90  --   CALCIUM 9.1  --   --    GFR: CrCl cannot be calculated (Unknown ideal weight.). Recent Labs  Lab 10/13/18 0630  WBC 12.6*    Liver Function Tests: Recent Labs  Lab 10/13/18 0630  AST 20  ALT 18  ALKPHOS 56  BILITOT 0.5  PROT 7.3  ALBUMIN 4.1   No results for input(s): LIPASE, AMYLASE in the last 168 hours. No results for input(s): AMMONIA in the last 168 hours.  ABG    Component Value Date/Time   PHART 7.322 (L) 10/13/2018 1936   PCO2ART 38.8 10/13/2018 1936   PO2ART 311.0 (H) 10/13/2018 1936   HCO3 19.9 (L) 10/13/2018 1936   TCO2 21 (L) 10/13/2018 1936   ACIDBASEDEF 5.0 (H) 10/13/2018 1936   O2SAT 100.0 10/13/2018 1936     Coagulation Profile: Recent Labs  Lab 10/13/18 0630  INR 1.1    Cardiac Enzymes: No results for input(s): CKTOTAL, CKMB, CKMBINDEX, TROPONINI in the last 168 hours.  HbA1C: No results found for: HGBA1C  CBG: Recent Labs  Lab 10/13/18 0625  GLUCAP 139*    Review of Systems:   Unable to review as patient is intubated/sedated  Past Medical History  He,  has no past medical history on file.   Surgical History   History reviewed. No pertinent surgical history.   Social History      Family History   His family history is not on file.   Allergies No Known Allergies   Home Medications  Prior to Admission medications   Medication Sig Start Date End Date Taking? Authorizing Provider  fluticasone (FLONASE) 50 MCG/ACT nasal spray  Place 1 spray into both nostrils as needed for allergies or rhinitis.   Yes [provider]  ibuprofen (ADVIL) 200 MG tablet Take 400 mg by mouth every 6 (six) hours as needed for headache or moderate pain.   Yes [provider]  loratadine (CLARITIN) 10 MG tablet Take 10 mg by mouth daily as needed for allergies.   Yes [provider]     Critical care time: 32 minutes     Hayden Pedro, AGACNP-BC [facility]  PCCM Pgr: [phone]

## 2018-10-13 NOTE — Anesthesia Procedure Notes (Signed)
Procedure Name: Intubation Date/Time: 10/13/2018 2:48 PM Performed by: Harden Mo, CRNA Pre-anesthesia Checklist: Patient identified, Emergency Drugs available, Suction available and Patient being monitored Patient Re-evaluated:Patient Re-evaluated prior to induction Oxygen Delivery Method: Circle System Utilized Preoxygenation: Pre-oxygenation with 100% oxygen Induction Type: IV induction, Rapid sequence and Cricoid Pressure applied Laryngoscope Size: Miller and 2 Grade View: Grade I Tube type: Oral Tube size: 7.5 mm Number of attempts: 1 Airway Equipment and Method: Stylet and Oral airway Placement Confirmation: ETT inserted through vocal cords under direct vision,  positive ETCO2 and breath sounds checked- equal and bilateral Secured at: 21 cm Tube secured with: Tape Dental Injury: Teeth and Oropharynx as per pre-operative assessment

## 2018-10-13 NOTE — ED Notes (Signed)
Pt had emesis X1 while on CT table.  Verbal given for zofran 4mg  by Neuro provider.  Given by RN
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
## 2018-10-13 NOTE — ED Notes (Signed)
ED TO INPATIENT HANDOFF REPORT  ED Nurse Name and Phone #:    S Name/Age/Gender Gregory Moreno 23 y.o. male Room/Bed: 025C/025C  Code Status   Code Status: Full Code  Home/SNF/Other Home Patient oriented to: self, place, time and situation Is this baseline? Yes   Triage Complete: Triage complete  Chief Complaint ha  Triage Note Pt c/o left sided headache that woke him from sleep. On arrival, pt appears confused, calling items the wrong name. When asked if he knew where he was, pt stated "I know who my grandma is". Right sided weakness. Dr. Dayna Barker at bedside, code stroke activated.    Allergies No Known Allergies  Level of Care/Admitting Diagnosis ED Disposition    ED Disposition Condition [facility] Area: [place] [[id number]]  Level of Care: ICU [6]  Covid Evaluation: Confirmed COVID Negative  Diagnosis: ICH (intracerebral hemorrhage) [facility]) [id number]  Admitting Physician: Judith Part [id number]  Attending Physician: Judith Part [phone]  Estimated length of stay: inpatient only procedure  Certification:: I certify this patient is being admitted for an inpatient-only procedure  Bed request comments: 4N  PT Class (Do Not Modify): Inpatient [101]  PT Acc Code (Do Not Modify): Private [1]       B Medical/Surgery History History reviewed. No pertinent past medical history. History reviewed. No pertinent surgical history.   A IV Location/Drains/Wounds Patient Lines/Drains/Airways Status   Active Line/Drains/Airways    Name:   Placement date:   Placement time:   Site:   Days:   Peripheral IV 10/13/18 Right Antecubital   10/13/18    0646    Antecubital   less than 1          Intake/Output Last 24 hours No intake or output data in the 24 hours ending 10/13/18 1001  Labs/Imaging Results for orders placed or performed during the hospital encounter of 10/13/18 (from the past 48 hour(s))  CBG monitoring, ED      Status: Abnormal   Collection Time: 10/13/18  6:25 AM  Result Value Ref Range   Glucose-Capillary 139 (H) 70 - 99 mg/dL  Ethanol     Status: None   Collection Time: 10/13/18  6:30 AM  Result Value Ref Range   Alcohol, Ethyl (B) <10 <10 mg/dL    Comment: (NOTE) Lowest detectable limit for serum alcohol is 10 mg/dL. For medical purposes only. Performed at [facility], [place] [address], [place], [place] [address]   Protime-INR     Status: None   Collection Time: 10/13/18  6:30 AM  Result Value Ref Range   Prothrombin Time 13.7 11.4 - 15.2 seconds   INR 1.1 0.8 - 1.2    Comment: (NOTE) INR goal varies based on device and disease states. Performed at [facility], [place] [address], [place], [place] [address]   APTT     Status: None   Collection Time: 10/13/18  6:30 AM  Result Value Ref Range   aPTT 25 24 - 36 seconds    Comment: Performed at [place] [address], [place] [address]  CBC     Status: Abnormal   Collection Time: 10/13/18  6:30 AM  Result Value Ref Range   WBC 12.6 (H) 4.0 - 10.5 K/uL   RBC 5.38 4.22 - 5.81 MIL/uL   Hemoglobin 15.9 13.0 - 17.0 g/dL   HCT 47.6 39.0 - 52.0 %   MCV 88.5 80.0 -  100.0 fL   MCH 29.6 26.0 - 34.0 pg   MCHC 33.4 30.0 - 36.0 g/dL   RDW 12.4 11.5 - 15.5 %   Platelets 186 150 - 400 K/uL   nRBC 0.0 0.0 - 0.2 %    Comment: Performed at [facility], [place] [address], [place], [place] [address]  Differential     Status: Abnormal   Collection Time: 10/13/18  6:30 AM  Result Value Ref Range   Neutrophils Relative % 84 %   Neutro Abs 10.7 (H) 1.7 - 7.7 K/uL   Lymphocytes Relative 9 %   Lymphs Abs 1.2 0.7 - 4.0 K/uL   Monocytes Relative 5 %   Monocytes Absolute 0.6 0.1 - 1.0 K/uL   Eosinophils Relative 1 %   Eosinophils Absolute 0.1 0.0 - 0.5 K/uL   Basophils Relative 0 %   Basophils Absolute 0.0 0.0 - 0.1 K/uL   Immature Granulocytes 1 %   Abs Immature Granulocytes 0.07 0.00 - 0.07 K/uL    Comment:  Performed at [place] [address] [place], [place] [address]  Comprehensive metabolic panel     Status: Abnormal   Collection Time: 10/13/18  6:30 AM  Result Value Ref Range   Sodium 137 135 - 145 mmol/L   Potassium 4.4 3.5 - 5.1 mmol/L   Chloride 101 98 - 111 mmol/L   CO2 26 22 - 32 mmol/L   Glucose, Bld 118 (H) 70 - 99 mg/dL   BUN 20 6 - 20 mg/dL   Creatinine, Ser 1.02 0.61 - 1.24 mg/dL   Calcium 9.1 8.9 - 10.3 mg/dL   Total Protein 7.3 6.5 - 8.1 g/dL   Albumin 4.1 3.5 - 5.0 g/dL   AST 20 15 - 41 U/L   ALT 18 0 - 44 U/L   Alkaline Phosphatase 56 38 - 126 U/L   Total Bilirubin 0.5 0.3 - 1.2 mg/dL   GFR calc non Af Amer >60 >60 mL/min   GFR calc Af Amer >60 >60 mL/min   Anion gap 10 5 - 15    Comment: Performed at [facility], [place] [address], [place], [place] [address]  SARS Coronavirus 2 [facility] order, Performed in [facility] hospital lab) Nasopharyngeal Nasopharyngeal Swab     Status: None   Collection Time: 10/13/18  8:03 AM   Specimen: Nasopharyngeal Swab  Result Value Ref Range   SARS Coronavirus 2 NEGATIVE NEGATIVE    Comment: (NOTE) If result is NEGATIVE SARS-CoV-2 target nucleic acids are NOT DETECTED. The SARS-CoV-2 RNA is generally detectable in upper and lower  respiratory specimens during the acute phase of infection. The lowest  concentration of SARS-CoV-2 viral copies this assay can detect is 250  copies / mL. A negative result does not preclude SARS-CoV-2 infection  and should not be used as the sole basis for treatment or other  patient management decisions.  A negative result may occur with  improper specimen collection / handling, submission of specimen other  than nasopharyngeal swab, presence of viral mutation(s) within the  areas targeted by this assay, and inadequate number of viral copies  (<250 copies / mL). A negative result must be combined with clinical  observations, patient history, and epidemiological information. If  result is POSITIVE SARS-CoV-2 target nucleic acids are DETECTED. The SARS-CoV-2 RNA is generally detectable in upper and lower  respiratory specimens dur ing the acute phase of infection.  Positive  results are indicative of active infection  with SARS-CoV-2.  Clinical  correlation with patient history and other diagnostic information is  necessary to determine patient infection status.  Positive results do  not rule out bacterial infection or co-infection with other viruses. If result is PRESUMPTIVE POSTIVE SARS-CoV-2 nucleic acids MAY BE PRESENT.   A presumptive positive result was obtained on the submitted specimen  and confirmed on repeat testing.  While 2019 novel coronavirus  (SARS-CoV-2) nucleic acids may be present in the submitted sample  additional confirmatory testing may be necessary for epidemiological  and / or clinical management purposes  to differentiate between  SARS-CoV-2 and other Sarbecovirus currently known to infect humans.  If clinically indicated additional testing with an alternate test  methodology [phone]) is advised. The SARS-CoV-2 RNA is generally  detectable in upper and lower respiratory sp ecimens during the acute  phase of infection. The expected result is Negative. Fact Sheet for Patients:  StrictlyIdeas.no Fact Sheet for Healthcare Providers: BankingDealers.co.za This test is not yet approved or cleared by the [place] FDA and has been authorized for detection and/or diagnosis of SARS-CoV-2 by FDA under an Emergency Use Authorization (EUA).  This EUA will remain in effect (meaning this test can be used) for the duration of the COVID-19 declaration under Section 564(b)(1) of the Act, 21 U.S.C. section 360bbb-3(b)(1), unless the authorization is terminated or revoked sooner. Performed at [facility], [place] [address], [place], [place] [address]   I-stat chem 8, ED     Status: Abnormal    Collection Time: 10/13/18  8:18 AM  Result Value Ref Range   Sodium 138 135 - 145 mmol/L   Potassium 4.3 3.5 - 5.1 mmol/L   Chloride 102 98 - 111 mmol/L   BUN 23 (H) 6 - 20 mg/dL   Creatinine, Ser 0.90 0.61 - 1.24 mg/dL   Glucose, Bld 110 (H) 70 - 99 mg/dL   Calcium, Ion 1.15 1.15 - 1.40 mmol/L   TCO2 25 22 - 32 mmol/L   Hemoglobin 16.3 13.0 - 17.0 g/dL   HCT 48.0 39.0 - 52.0 %   Ct Angio Head W Or Wo Contrast  Result Date: 10/13/2018 CLINICAL DATA:  Cerebral hemorrhage EXAM: CT ANGIOGRAPHY HEAD AND NECK TECHNIQUE: Multidetector CT imaging of the head and neck was performed using the standard protocol during bolus administration of intravenous contrast. Multiplanar CT image reconstructions and MIPs were obtained to evaluate the vascular anatomy. Carotid stenosis measurements (when applicable) are obtained utilizing NASCET criteria, using the distal internal carotid diameter as the denominator. CONTRAST:  26mL OMNIPAQUE IOHEXOL 350 MG/ML SOLN COMPARISON:  Head CT earlier today FINDINGS: CTA NECK FINDINGS Aortic arch: Normal Right carotid system: Normal when accounting for mild motion. Left carotid system: Normal when accounting for mild motion Vertebral arteries: Normal Skeleton: Negative Other neck: Negative Upper chest: Clear apical lungs Review of the MIP images confirms the above findings CTA HEAD FINDINGS Anterior circulation: Vessels are smooth and widely patent. Hypoplastic right A1 segment. Negative for aneurysm or vascular malformation. No convincing dot sign or abnormal vessels within the hematoma. Proximal left MCA vessels are displaced superiorly Posterior circulation: Vessels are smooth and widely patent. No branch occlusion or aneurysm. Venous sinuses: Patent as permitted by contrast timing. Anatomic variants: As above Delayed phase: When densitometry of the periphery is compared to the noncontrast study there is no definite enhancement. Brain MRI with contrast is currently underway.  Review of the MIP images confirms the above findings IMPRESSION: No vascular explanation for the left  temporal hematoma. No aneurysm or visible vascular malformation. Major dural sinuses are patent. Electronically Signed   By: Monte Fantasia M.D.   On: 10/13/2018 07:17   Ct Angio Neck W Or Wo Contrast  Result Date: 10/13/2018 CLINICAL DATA:  Cerebral hemorrhage EXAM: CT ANGIOGRAPHY HEAD AND NECK TECHNIQUE: Multidetector CT imaging of the head and neck was performed using the standard protocol during bolus administration of intravenous contrast. Multiplanar CT image reconstructions and MIPs were obtained to evaluate the vascular anatomy. Carotid stenosis measurements (when applicable) are obtained utilizing NASCET criteria, using the distal internal carotid diameter as the denominator. CONTRAST:  33mL OMNIPAQUE IOHEXOL 350 MG/ML SOLN COMPARISON:  Head CT earlier today FINDINGS: CTA NECK FINDINGS Aortic arch: Normal Right carotid system: Normal when accounting for mild motion. Left carotid system: Normal when accounting for mild motion Vertebral arteries: Normal Skeleton: Negative Other neck: Negative Upper chest: Clear apical lungs Review of the MIP images confirms the above findings CTA HEAD FINDINGS Anterior circulation: Vessels are smooth and widely patent. Hypoplastic right A1 segment. Negative for aneurysm or vascular malformation. No convincing dot sign or abnormal vessels within the hematoma. Proximal left MCA vessels are displaced superiorly Posterior circulation: Vessels are smooth and widely patent. No branch occlusion or aneurysm. Venous sinuses: Patent as permitted by contrast timing. Anatomic variants: As above Delayed phase: When densitometry of the periphery is compared to the noncontrast study there is no definite enhancement. Brain MRI with contrast is currently underway. Review of the MIP images confirms the above findings IMPRESSION: No vascular explanation for the left temporal hematoma. No  aneurysm or visible vascular malformation. Major dural sinuses are patent. Electronically Signed   By: Monte Fantasia M.D.   On: 10/13/2018 07:17   Ct Head Code Stroke Wo Contrast  Result Date: 10/13/2018 CLINICAL DATA:  Code stroke. Sudden onset left-sided headache with right facial droop and right-sided weakness EXAM: CT HEAD WITHOUT CONTRAST TECHNIQUE: Contiguous axial images were obtained from the base of the skull through the vertex without intravenous contrast. COMPARISON:  None. FINDINGS: Brain: Patchy high-density hematoma within the left temporal lobe with upward mass effect on the basal ganglia. High-density hematoma portion measures up to 4.4 cm (this measurement includes areas of non high-density parenchyma or un clotted blood). There is a rim of high density which encircles the hematoma and may reflect an underlying mass or subacute hematoma. The entire abnormality measures up to 6 cm in diameter. Local mass effect with midline shift measuring up to 4 mm. No entrapment. Vascular: No hyperdense vessel or unexpected calcification. Skull: Normal. Negative for fracture or focal lesion. Sinuses/Orbits: No acute finding. Other: Critical Value/emergent results were called by telephone at the time of interpretation on 10/13/2018 at 6:49 am to providerArora, who verbally acknowledged these results. ASPECTS [facility] Stroke Program Early CT Score) Not scored in this setting IMPRESSION: 1. Acute hematoma in the left temporal lobe which appears encapsulated by a high-density rim, suspected underlying mass or subacute hemorrhage. CTA and brain MRI are pending. 2. 4 mm of midline shift. Electronically Signed   By: Monte Fantasia M.D.   On: 10/13/2018 06:52    Pending Labs Unresulted Labs (From admission, onward)    Start     Ordered   10/13/18 0630  Urine rapid drug screen (hosp performed)  ONCE - STAT,   STAT     10/13/18 0630   10/13/18 0630  Urinalysis, Routine w reflex microscopic  ONCE - STAT,   STAT  10/13/18 0630          Vitals/Pain Today's Vitals   10/13/18 0845 10/13/18 0900 10/13/18 0915 10/13/18 0945  BP: (!) 143/82 129/66 126/82 (!) 137/93  Pulse: 73  64 (!) 153  Resp: 13 14 16 15   Temp:      TempSrc:      SpO2: 99%  99% 98%    Isolation Precautions Airborne and Contact precautions  Medications Medications  dexamethasone (DECADRON) injection 10 mg (has no administration in time range)  ondansetron (ZOFRAN) tablet 4 mg (has no administration in time range)    Or  ondansetron (ZOFRAN) injection 4 mg (has no administration in time range)  promethazine (PHENERGAN) tablet 12.5-25 mg (has no administration in time range)  labetalol (NORMODYNE) injection 10-40 mg (has no administration in time range)  0.9 %  sodium chloride infusion (has no administration in time range)  acetaminophen (TYLENOL) tablet 650 mg (has no administration in time range)    Or  acetaminophen (TYLENOL) suppository 650 mg (has no administration in time range)  HYDROcodone-acetaminophen (NORCO/VICODIN) 5-325 MG per tablet 1 tablet (has no administration in time range)  HYDROmorphone (DILAUDID) injection 0.5-1 mg (has no administration in time range)  docusate sodium (COLACE) capsule 100 mg (has no administration in time range)  dexamethasone (DECADRON) injection 6 mg (has no administration in time range)    Followed by  dexamethasone (DECADRON) injection 4 mg (has no administration in time range)    Followed by  dexamethasone (DECADRON) injection 4 mg (has no administration in time range)  iohexol (OMNIPAQUE) 350 MG/ML injection 50 mL (50 mLs Intravenous Contrast Given 10/13/18 0655)  gadobutrol (GADAVIST) 1 MMOL/ML injection 7 mL (7 mLs Intravenous Contrast Given 10/13/18 0745)  promethazine (PHENERGAN) injection 12.5 mg (12.5 mg Intravenous Given 10/13/18 0845)  iohexol (OMNIPAQUE) 300 MG/ML solution 80 mL (80 mLs Intravenous Contrast Given 10/13/18 0948)    Mobility walks Low fall risk    Focused Assessments Neuro Assessment Handoff:  Swallow screen pass? No  Cardiac Rhythm: Normal sinus rhythm NIH Stroke Scale ( + Modified Stroke Scale Criteria)  Interval: Initial Level of Consciousness (1a.)   : Alert, keenly responsive LOC Questions (1b. )   +: Answers neither question correctly LOC Commands (1c. )   + : Performs both tasks correctly Best Gaze (2. )  +: Normal Visual (3. )  +: Complete hemianopia Facial Palsy (4. )    : Minor paralysis Motor Arm, Left (5a. )   +: No drift Motor Arm, Right (5b. )   +: Drift Motor Leg, Left (6a. )   +: No drift Motor Leg, Right (6b. )   +: No effort against gravity Limb Ataxia (7. ): Absent Sensory (8. )   +: Normal, no sensory loss Best Language (9. )   +: Severe aphasia Dysarthria (10. ): Normal Extinction/Inattention (11.)   +: Visual/tactile/auditory/spatial/personal inattention Modified SS Total  +: 11 Complete NIHSS TOTAL: 12 Last date known well: 10/12/18 Last time known well: 2359 Neuro Assessment: Exceptions to WDL Neuro Checks:   Initial (10/13/18 0745)  Last Documented NIHSS Modified Score: 11 (10/13/18 0745) Has TPA been given? No If patient is a Neuro Trauma and patient is going to OR before floor call report to [place] nurse: [phone] or [phone]     R Recommendations: See Admitting Provider Note  Report given to:   Additional Notes:

## 2018-10-13 NOTE — ED Notes (Signed)
Patient continues to vomit, spoke with EDP, Mother at bedside.

## 2018-10-13 NOTE — ED Notes (Signed)
Receive page for a code stroke in room 1 of green but no one was in room. Then looked on epic and saw pt was in room 25, went to room and pt not in room.

## 2018-10-13 NOTE — ED Provider Notes (Signed)
  Physical Exam  BP 129/80   Pulse (!) 50   Temp 98.1 F (36.7 C) (Oral)   Resp (!) 25   Wt 7.8 kg   SpO2 100%   Physical Exam  ED Course/Procedures     Procedures  MDM  Patient with intracranial hemorrhage.  Appears to be secondary to tumor.  Discussed with Dr. Venetia Constable from neurosurgery.  Will admit.       Davonna Belling, MD 10/13/18 1517

## 2018-10-13 NOTE — Op Note (Signed)
PATIENT: [name]  DAY OF SURGERY: 10/13/18   PRE-OPERATIVE DIAGNOSIS:  Intracerebral hemorrhage, intracranial tumor   POST-OPERATIVE DIAGNOSIS:  Same   PROCEDURE:  Left craniotomy for hematoma evacuation and tumor resection   SURGEON:  Surgeon(s) and Role:    Judith Part, MD - Primary   ANESTHESIA: ETGA   BRIEF HISTORY: This is a 23 year old man who presented with acute headache, nausea, and vomiting followed by hemiplegia and aphasia with development of a left CN3 palsy. 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 I recommended surgical decompression of the hematoma as well as removal of any underlying tumor that was visualized. Given his language deficit, consent was obtained from his mother.    OPERATIVE DETAIL:  The anesthesia team called me prior to induction to let me know that he had become more obtunded in preop and they brought him back to the OR to secure his airway. He was ventilating well but his left pupil was now blown. I was concerned about hematoma expansion, so they secured the airway and gave 1g/kg of mannitol and I decided to proceed with an emergent trauma flap and hematoma evacuation without stereotaxy. While this would limit tumor resection and potentially not confirm a diagnosis, his clinical condition was serious and therefore warranted emergent treatment.   The patient was placed on the OR table in the supine position. A formal time out was performed with two patient identifiers and confirmed the operative site. Anesthesia was induced by the anesthesia team. The [place] head holder was applied to the head and secured to the bed. Hair was clipped with surgical clippers over the incision and the area was then prepped and draped in a sterile fashion.  An inverted question mark incision was placed on the left. Soft tissue was dissected and a craniotomy flap was quickly  turned. The dura was opened and a corticotomy was created in the anterior aspect of the middle temporal gyrus to encounter the hematoma. The hematoma was immediately encountered and under significant pressure, delivering itself outward. I internally debulked the hematoma while saving the hematoma contents for pathology.  The tumor margins were then identified and dissected circumferentially by following the edge of the hematoma everywhere except where it approached critical structures, especially the medial edge. There was clearly abnormal tissue that was friable and vascular. A nodule of this was sent for frozen section and the remainder was sent for permanent.   Posteriorly, the posterior border of the tumor was followed into the temporal horn to allow for identification of the anterior choroidal artery. I opened the temporal horn to confirm anatomical landmarks and then found a pial plane anteriorly to identify the oculomotor nerve across the pia in the cistern. Subpial dissection proceeded posteriorly, but that portion of the hippocampus did not appear grossly abnormal.   After a maximal safe resection of the tumor that was visible, hemostasis was obtained, the dura was reapproximated, the bone flap was replaced with titanium plates and screws. All instrument and sponge counts were correct, the incision was then closed in layers. The patient was then returned to anesthesia for emergence.    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

## 2018-10-13 NOTE — Brief Op Note (Signed)
10/13/2018  6:37 PM  PATIENT:  Gregory Moreno  23 y.o. male  PRE-OPERATIVE DIAGNOSIS:  ICH  POST-OPERATIVE DIAGNOSIS:  INTRACEREBRAL HEMORRHAGE  PROCEDURE:  Procedure(s): LEFT CRANIOTOMY FOR TUMOR RESECTION (Left)  SURGEON:  Surgeon(s) and Role:    * Azzure Garabedian, Joyice Faster, MD - Primary  PHYSICIAN ASSISTANT:   ASSISTANTS: none   ANESTHESIA:   general  EBL:  1000 mL   BLOOD ADMINISTERED:none  DRAINS: none   LOCAL MEDICATIONS USED:  LIDOCAINE   SPECIMEN:  Source of Specimen:  left temporal brain tumor  DISPOSITION OF SPECIMEN:  PATHOLOGY  COUNTS:  YES  TOURNIQUET:  * No tourniquets in log *  DICTATION: .Note written in EPIC  PLAN OF CARE: Admit to inpatient   PATIENT DISPOSITION:  ICU - intubated and hemodynamically stable.   Delay start of Pharmacological VTE agent (>24hrs) due to surgical blood loss or risk of bleeding: yes

## 2018-10-13 NOTE — Progress Notes (Signed)
Patient arrived to unit responsive to external stimuli but unable to answer orientation questions. Patient making sounds but unable to understand what words he was saying. Patient hooked to our telemetry and oxygen monitors. Patients IV checked for patency and a new IV was placed with Pecolia Ades RN. Patient pulling away when attempting IV and also was attempting to pull mask off of face. Patient closely monitored and informed additional nursing staff to closely monitor patient while waiting for OR to be ready. Patients vitals stable.

## 2018-10-13 NOTE — Anesthesia Preprocedure Evaluation (Addendum)
Anesthesia Evaluation  Patient identified by MRN, date of birth, ID band Patient awake    Reviewed: Allergy & Precautions, NPO status , Patient's Chart, lab work & pertinent test results  History of Anesthesia Complications (+) DIFFICULT AIRWAY  Airway Mallampati: II  TM Distance: >3 FB     Dental   Pulmonary    breath sounds clear to auscultation       Cardiovascular negative cardio ROS   Rhythm:Regular Rate:Normal     Neuro/Psych History noted. CG    GI/Hepatic negative GI ROS, Neg liver ROS,   Endo/Other  negative endocrine ROS  Renal/GU negative Renal ROS     Musculoskeletal   Abdominal   Peds  Hematology   Anesthesia Other Findings   Reproductive/Obstetrics                            Anesthesia Physical Anesthesia Plan  ASA: IV  Anesthesia Plan: General   Post-op Pain Management:    Induction: Intravenous  PONV Risk Score and Plan: 2 and Ondansetron  Airway Management Planned:   Additional Equipment:   Intra-op Plan:   Post-operative Plan: Possible Post-op intubation/ventilation  Informed Consent: I have reviewed the patients History and Physical, chart, labs and discussed the procedure including the risks, benefits and alternatives for the proposed anesthesia with the patient or authorized representative who has indicated his/her understanding and acceptance.     Dental advisory given  Plan Discussed with: CRNA and Anesthesiologist  Anesthesia Plan Comments:         Anesthesia Quick Evaluation

## 2018-10-13 NOTE — Transfer of Care (Signed)
Immediate Anesthesia Transfer of Care Note  Patient: [name]  Procedure(s) Performed: LEFT CRANIOTOMY FOR TUMOR RESECTION (Left Head)  Patient Location: ICU  Anesthesia Type:General  Level of Consciousness: comatose and Patient remains intubated per anesthesia plan  Airway & Oxygen Therapy: Patient remains intubated per anesthesia plan and Patient placed on Ventilator (see vital sign flow sheet for setting)  Post-op Assessment: Report given to RN and Post -op Vital signs reviewed and stable  Post vital signs: Reviewed and stable  Last Vitals:  Vitals Value Taken Time  BP    Temp    Pulse 92 10/13/18 1850  Resp 9 10/13/18 1850  SpO2 100 % 10/13/18 1850  Vitals shown include unvalidated device data.  Last Pain:  Vitals:   10/13/18 0618  TempSrc: Oral         Complications: No apparent anesthesia complications

## 2018-10-13 NOTE — ED Triage Notes (Signed)
Pt c/o left sided headache that woke him from sleep. On arrival, pt appears confused, calling items the wrong name. When asked if he knew where he was, pt stated "I know who my grandma is". Right sided weakness. Dr. Dayna Barker at bedside, code stroke activated.

## 2018-10-14 ENCOUNTER — Other Ambulatory Visit: Payer: Self-pay

## 2018-10-14 ENCOUNTER — Inpatient Hospital Stay (HOSPITAL_COMMUNITY): Payer: 59

## 2018-10-14 ENCOUNTER — Encounter (HOSPITAL_COMMUNITY): Payer: Self-pay

## 2018-10-14 DIAGNOSIS — J9600 Acute respiratory failure, unspecified whether with hypoxia or hypercapnia: Secondary | ICD-10-CM

## 2018-10-14 LAB — BASIC METABOLIC PANEL
Anion gap: 12 (ref 5–15)
BUN: 14 mg/dL (ref 6–20)
CO2: 20 mmol/L — ABNORMAL LOW (ref 22–32)
Calcium: 8.9 mg/dL (ref 8.9–10.3)
Chloride: 106 mmol/L (ref 98–111)
Creatinine, Ser: 1.08 mg/dL (ref 0.61–1.24)
GFR calc Af Amer: >60 mL/min (ref >60)
GFR calc non Af Amer: >60 mL/min (ref >60)
Glucose, Bld: 157 mg/dL — ABNORMAL HIGH (ref 70–99)
Potassium: 4 mmol/L (ref 3.5–5.1)
Sodium: 138 mmol/L (ref 135–145)

## 2018-10-14 LAB — POCT I-STAT 7, (LYTES, BLD GAS, ICA,H+H)
Acid-base deficit: 2 mmol/L (ref 0.0–2.0)
Bicarbonate: 21.5 mmol/L (ref 20.0–28.0)
Calcium, Ion: 1.27 mmol/L (ref 1.15–1.40)
HCT: 33 % — ABNORMAL LOW (ref 39.0–52.0)
Hemoglobin: 11.2 g/dL — ABNORMAL LOW (ref 13.0–17.0)
O2 Saturation: 100 %
Patient temperature: 98.7
Potassium: 3.9 mmol/L (ref 3.5–5.1)
Sodium: 140 mmol/L (ref 135–145)
TCO2: 23 mmol/L (ref 22–32)
pCO2 arterial: 33.7 mmHg (ref 32.0–48.0)
pH, Arterial: 7.414 (ref 7.350–7.450)
pO2, Arterial: 211 mmHg — ABNORMAL HIGH (ref 83.0–108.0)

## 2018-10-14 LAB — CBC
HCT: 34.5 % — ABNORMAL LOW (ref 39.0–52.0)
Hemoglobin: 11.6 g/dL — ABNORMAL LOW (ref 13.0–17.0)
MCH: 29.5 pg (ref 26.0–34.0)
MCHC: 33.6 g/dL (ref 30.0–36.0)
MCV: 87.8 fL (ref 80.0–100.0)
Platelets: 170 10*3/uL (ref 150–400)
RBC: 3.93 MIL/uL — ABNORMAL LOW (ref 4.22–5.81)
RDW: 12.7 % (ref 11.5–15.5)
WBC: 14.1 10*3/uL — ABNORMAL HIGH (ref 4.0–10.5)
nRBC: 0 % (ref 0.0–0.2)

## 2018-10-14 LAB — GLUCOSE, CAPILLARY
Glucose-Capillary: 120 mg/dL — ABNORMAL HIGH (ref 70–99)
Glucose-Capillary: 139 mg/dL — ABNORMAL HIGH (ref 70–99)
Glucose-Capillary: 145 mg/dL — ABNORMAL HIGH (ref 70–99)

## 2018-10-14 LAB — MAGNESIUM: Magnesium: 1.8 mg/dL (ref 1.7–2.4)

## 2018-10-14 LAB — PHOSPHORUS: Phosphorus: 3.5 mg/dL (ref 2.5–4.6)

## 2018-10-14 MED ORDER — INFLUENZA VAC SPLIT QUAD 0.5 ML IM SUSY
0.5000 mL | PREFILLED_SYRINGE | INTRAMUSCULAR | Status: AC
  Administered 2018-10-15: 0.5 mL via INTRAMUSCULAR
  Filled 2018-10-14: qty 0.5

## 2018-10-14 MED ORDER — GADOBUTROL 1 MMOL/ML IV SOLN
7.0000 mL | Freq: Once | INTRAVENOUS | Status: AC | PRN
  Administered 2018-10-14: 7 mL via INTRAVENOUS

## 2018-10-14 NOTE — Anesthesia Postprocedure Evaluation (Signed)
Anesthesia Post Note  Patient: Gregory Moreno  Procedure(s) Performed: LEFT CRANIOTOMY FOR TUMOR RESECTION (Left Head)     Patient location during evaluation: SICU Anesthesia Type: General Level of consciousness: sedated Pain management: pain level controlled Vital Signs Assessment: post-procedure vital signs reviewed and stable Respiratory status: patient remains intubated per anesthesia plan Cardiovascular status: stable Postop Assessment: no apparent nausea or vomiting Anesthetic complications: no    Last Vitals:  Vitals:   10/14/18 0400 10/14/18 0505  BP: 98/62   Pulse: 91   Resp: 16   Temp: 37.1 C   SpO2: 100% 100%    Last Pain:  Vitals:   10/14/18 0400  TempSrc: Oral                 Tiajuana Amass

## 2018-10-14 NOTE — Progress Notes (Signed)
Rehab Admissions Coordinator Note:  Patient was screened by Cleatrice Burke for appropriateness for an Inpatient Acute Rehab Consult per OT recs.  At this time, we are recommending Inpatient Rehab consult. Please place order for consult.   Cleatrice Burke RN MSN 10/14/2018, 4:59 PM  I can be reached at [phone].

## 2018-10-14 NOTE — Progress Notes (Addendum)
Neurosurgery Service Progress Note  Subjective: No acute events overnight   Objective: Vitals:   10/14/18 0505 10/14/18 0600 10/14/18 0630 10/14/18 0700  BP:  96/66 100/66 101/65  Pulse:  81 80 79  Resp:  16 16 16   Temp:      TempSrc:      SpO2: 100% 100% 100% 100%  Weight:      Height:       Temp (24hrs), Avg:99 F (37.2 C), Min:98.4 F (36.9 C), Max:100 F (37.8 C)  CBC Latest Ref Rng & Units 10/14/2018 10/14/2018 10/13/2018  WBC 4.0 - 10.5 K/uL - 14.1(H) -  Hemoglobin 13.0 - 17.0 g/dL 11.2(L) 11.6(L) 12.2(L)  Hematocrit 39.0 - 52.0 % 33.0(L) 34.5(L) 36.0(L)  Platelets 150 - 400 K/uL - 170 -   BMP Latest Ref Rng & Units 10/14/2018 10/14/2018 10/13/2018  Glucose 70 - 99 mg/dL - 157(H) -  BUN 6 - 20 mg/dL - 14 -  Creatinine 0.61 - 1.24 mg/dL - 1.08 -  Sodium 135 - 145 mmol/L 140 138 140  Potassium 3.5 - 5.1 mmol/L 3.9 4.0 4.1  Chloride 98 - 111 mmol/L - 106 -  CO2 22 - 32 mmol/L - 20(L) -  Calcium 8.9 - 10.3 mg/dL - 8.9 -    Intake/Output Summary (Last 24 hours) at 10/14/2018 0743 Last data filed at 10/14/2018 0600 Gross per 24 hour  Intake 2809.48 ml  Output 2925 ml  Net -115.52 ml    Current Facility-Administered Medications:  .  0.9 %  sodium chloride infusion, , Intravenous, Continuous, Ostergard, Joyice Faster, MD, Last Rate: 75 mL/hr at 10/14/18 0600 .  0.9 %  sodium chloride infusion, , Intravenous, Continuous, Ostergard, Joyice Faster, MD, Last Rate: 10 mL/hr at 10/13/18 1250 .  acetaminophen (TYLENOL) tablet 650 mg, 650 mg, Oral, Q4H PRN **OR** acetaminophen (TYLENOL) suppository 650 mg, 650 mg, Rectal, Q4H PRN, Ostergard, Thomas A, MD .  chlorhexidine gluconate (MEDLINE KIT) (PERIDEX) 0.12 % solution 15 mL, 15 mL, Mouth Rinse, BID, Ostergard, Joyice Faster, MD, 15 mL at 10/14/18 0728 .  Chlorhexidine Gluconate Cloth 2 % PADS 6 each, 6 each, Topical, Daily, Judith Part, MD, 6 each at 10/13/18 1153 .  dexamethasone (DECADRON) injection 10 mg, 10 mg, Intravenous, Q6H,  Ostergard, Joyice Faster, MD, 10 mg at 10/14/18 1448 .  docusate sodium (COLACE) capsule 100 mg, 100 mg, Oral, BID, Ostergard, Thomas A, MD .  fentaNYL 2568mg in NS 2552m(108mml) infusion-PREMIX, 0-400 mcg/hr, Intravenous, Continuous, Ostergard, Thomas A, MD, Last Rate: 10 mL/hr at 10/14/18 0600, 100 mcg/hr at 10/14/18 0600 .  MEDLINE mouth rinse, 15 mL, Mouth Rinse, 10 times per day, OstJudith PartD, 15 mL at 10/14/18 0549 .  ondansetron (ZOFRAN) tablet 4 mg, 4 mg, Oral, Q4H PRN **OR** ondansetron (ZOFRAN) injection 4 mg, 4 mg, Intravenous, Q4H PRN, OstJudith PartD, 4 mg at 10/13/18 1153 .  pantoprazole (PROTONIX) injection 40 mg, 40 mg, Intravenous, Q24H, Eubanks, Katalina M, NP, 40 mg at 10/14/18 0044 .  promethazine (PHENERGAN) tablet 12.5-25 mg, 12.5-25 mg, Oral, Q4H PRN, OstJudith PartD .  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 Resection converted to emergent hematoma evacuation without navigation due to preop decompensation.   -MRI brain w/wo contrast today to evaluate for tumor resection -path pending -extubate post-MRI -high dose dex x24h, will start taper tomorrow -SCDs/TEDs, hold SQH until POD3  Judith Part  10/14/18 7:43 AM

## 2018-10-14 NOTE — Evaluation (Signed)
Physical Therapy Evaluation Patient Details Name: Gregory Moreno MRN: [id number] DOB: 19-Dec-1995 Today's Date: 10/14/2018   History of Present Illness  23 year old male presents to ED on 9/28 with left sided headache. On arrival to ED appeared confused, noted right sided weakness. Code Stroke Activated. CT Head/MRI with hematoma in left temporal lobe with encapsulation of a high density rim suspicious for a underlying mass. Neurosurgery consulted. Taken to OR for craniotomy for tumor resection and hemorrhage evacuation  Clinical Impression  Patient presents with decreased mobility due to R hemiparesis, decreased balance, decreased midline orientation, decreased deficit awareness, decreased cognition and will benefit from skilled PT in the acute setting to maximize mobility.  Currently +2 A for EOB sitting and standing with R sided weakness.  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  Feel he will progress and be excellent candidate for CIR level rehab prior to d/c home.      Follow Up Recommendations CIR    Equipment Recommendations  Other (comment)(to be determined)    Recommendations for Other Services Rehab consult     Precautions / Restrictions Precautions Precautions: Fall Precaution Comments: L crani; R HP Restrictions Weight Bearing Restrictions: No      Mobility  Bed Mobility Overal bed mobility: Needs Assistance Bed Mobility: Supine to Sit;Sit to Supine     Supine to sit: Mod assist;+2 for physical assistance;+2 for safety/equipment Sit to supine: Max assist;+2 for physical assistance;+2 for safety/equipment   General bed mobility comments: assist for RLE over EOB and trunk elevation; max multimodal cues for assisting throughout  Transfers Overall transfer level: Needs assistance Equipment used: 2 person hand held assist Transfers: Sit to/from Stand Sit to Stand: Mod assist;+2 physical assistance;+2  safety/equipment         General transfer comment: use of recliner back for UE support during sit<>stand; +2 assist to rise and steady with R knee block provided; pt initially with L lateral lean, able to correct but fatigues quickly; +2 assist for static balance  Ambulation/Gait                Stairs            Wheelchair Mobility    Modified Rankin (Stroke Patients Only) Modified Rankin (Stroke Patients Only) Pre-Morbid Rankin Score: No symptoms Modified Rankin: Severe disability     Balance Overall balance assessment: Needs assistance Sitting-balance support: Feet supported;Single extremity supported;Bilateral upper extremity supported Sitting balance-Leahy Scale: Zero Sitting balance - Comments: requires maxA for sitting balance; briefly able to maintain without assist with close minguard; tends to push with LUE towards R   Standing balance support: Bilateral upper extremity supported;Single extremity supported Standing balance-Leahy Scale: Zero Standing balance comment: +2 assist for balance with R LE in flexion throughout, able to block knee, but pt without weight acceptance and once pushing away from UE supportHR                             Pertinent Vitals/Pain Pain Assessment: Faces Faces Pain Scale: No hurt Pain Intervention(s): Monitored during session    Home Living Family/patient expects to be discharged to:: Private residence Living Arrangements: Parent Available Help at Discharge: Family;Available 24 hours/day Type of Home: House Home Access: Stairs to enter   [employer] of Steps: 1 Home Layout: Two level;Able to live on main level with bedroom/bathroom Home Equipment: None  Prior Function Level of Independence: Independent         Comments: 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, has his pilot's license     Journalist, newspaper   Dominant Hand: Right    Extremity/Trunk Assessment    Upper Extremity Assessment Upper Extremity Assessment: Defer to OT evaluation RUE Deficits / Details: grossly 1/5 throughout, pt using LUE to assist with RUE ROM when assessing RUE Sensation: decreased light touch RUE Coordination: decreased fine motor;decreased gross motor LUE Deficits / Details: pt with discoordination noted with attempts to use LUE; strength appears in tact LUE Coordination: decreased fine motor    Lower Extremity Assessment Lower Extremity Assessment: RLE deficits/detail;LLE deficits/detail RLE Deficits / Details: PROM WFL, increased tone throughout, not pushing into resistance nor lifting to command RLE Sensation: decreased proprioception LLE Deficits / Details: AROM WFL, strength at least 4/5 throughout    Cervical / Trunk Assessment Cervical / Trunk Assessment: Other exceptions Cervical / Trunk Exceptions: difficulty maintaining upright posture, tending to have cervical flexion towards R  Communication   Communication: Expressive difficulties  Cognition Arousal/Alertness: Awake/alert;Lethargic Behavior During Therapy: Flat affect Overall Cognitive Status: Impaired/Different from baseline Area of Impairment: Attention;Memory;Following commands;Safety/judgement;Problem solving                   Current Attention Level: Sustained Memory: Decreased recall of precautions;Decreased short-term memory Following Commands: Follows one step commands inconsistently;Follows one step commands with increased time Safety/Judgement: Decreased awareness of safety;Decreased awareness of deficits   Problem Solving: Slow processing;Decreased initiation;Difficulty sequencing;Requires verbal cues;Requires tactile cues General Comments: pt requires multimodal cues for command following; intermittently responding to questions given increased time to do so, at times pt attempting to verbalize but difficult to understand      General Comments General comments (skin  integrity, edema, etc.): mother present throughout session, HR max 135, incontinent of urine in standing    Exercises     Assessment/Plan    PT Assessment Patient needs continued PT services  PT Problem List Decreased strength;Decreased balance;Decreased knowledge of use of DME;Decreased safety awareness;Decreased cognition;Decreased mobility;Decreased activity tolerance       PT Treatment Interventions DME instruction;Therapeutic activities;Balance training;Cognitive remediation;Patient/family education;Therapeutic exercise;Functional mobility training;Gait training    PT Goals (Current goals can be found in the Care Plan section)  Acute Rehab PT Goals Patient Stated Goal: to go home PT Goal Formulation: With patient/family Time For Goal Achievement: 10/28/18 Potential to Achieve Goals: Good    Frequency Min 4X/week   Barriers to discharge        Co-evaluation PT/OT/SLP Co-Evaluation/Treatment: Yes Reason for Co-Treatment: Complexity of the patient's impairments (multi-system involvement);For patient/therapist safety;To address functional/ADL transfers PT goals addressed during session: Mobility/safety with mobility;Balance OT goals addressed during session: Strengthening/ROM;ADL's and self-care       AM-PAC PT "6 Clicks" Mobility  Outcome Measure Help needed turning from your back to your side while in a flat bed without using bedrails?: A Lot Help needed moving from lying on your back to sitting on the side of a flat bed without using bedrails?: Total Help needed moving to and from a bed to a chair (including a wheelchair)?: Total Help needed standing up from a chair using your arms (e.g., wheelchair or bedside chair)?: Total Help needed to walk in hospital room?: Total Help needed climbing 3-5 steps with a railing? : Total 6 Click Score: 7    End of Session Equipment Utilized During Treatment: Gait belt Activity Tolerance: Patient limited by fatigue  Patient left:  in bed;with call bell/phone within reach;with family/visitor present Nurse Communication: Mobility status PT Visit Diagnosis: Other abnormalities of gait and mobility (R26.89);Other symptoms and signs involving the nervous system (R29.898);Hemiplegia and hemiparesis Hemiplegia - Right/Left: Right Hemiplegia - dominant/non-dominant: Dominant Hemiplegia - caused by: Other Nontraumatic intracranial hemorrhage    Time: 1510-1540 PT Time Calculation (min) (ACUTE ONLY): 30 min   Charges:   PT Evaluation $PT Eval Moderate Complexity: [place], [place] Acute Rehabilitation Services [phone] 10/14/2018   Reginia Naas 10/14/2018, 5:09 PM

## 2018-10-14 NOTE — Progress Notes (Signed)
Patient transported back from CT to 4N32.  No complications.

## 2018-10-14 NOTE — Progress Notes (Signed)
Patient transported to CT scan . 

## 2018-10-14 NOTE — Procedures (Signed)
Extubation Procedure Note  Patient Details:   Name: Gregory Moreno DOB: 1995/06/15 MRN: [id number]   Airway Documentation:    Vent end date: 10/14/18 Vent end time: 1139   Evaluation  O2 sats: stable throughout Complications: No apparent complications Patient did tolerate procedure well. Bilateral Breath Sounds: Clear   Yes  4l/min [place]  Revonda Standard 10/14/2018, 11:40 AM

## 2018-10-14 NOTE — Progress Notes (Signed)
NAME:  Gregory Moreno, MRN:  [id number], DOB:  1995/12/11, LOS: 1 ADMISSION DATE:  10/13/2018, CONSULTATION DATE:  10/13/2018 REFERRING MD:  Dr. 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 On arrival to ED appeared confused, noted right sided weakness. Code Stroke Activated. CT Head/MRI with hematoma in left temporal lobe with encapsulation of a high density rim suspicious for a underlying mass. Neurosurgery consulted. Taken to OR for craniotomy for tumor resection and hemorrhage evacuation. PCCM consulted post-operatively for vent management.    Past Medical History  [facility] Events   9/28 > OR for Left Craniotomy with tumor resection  Consults:  Neurology Neurosurgery  PCCM   Procedures:  ETT 9/28 >>   Significant Diagnostic Tests:  CT Head 9/28 > 1. Acute hematoma in the left temporal lobe which appears encapsulated by a high-density rim, suspected underlying mass or subacute hemorrhage. CTA and brain MRI are pending. 2. 4 mm of midline shift. CTA Head/Neck 9/28 > No vascular explanation for the left temporal hematoma. No aneurysm or visible vascular malformation. Major dural sinuses are patent. 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 Given age and the mild enhancement pattern question an enhancing low-grade glioma, as above. Biopsy targeting may be more accurate after the hematoma has diminished. CT Chest/A/P 9/28 > 1. Questionable nonspecific segmental wall thickening in the collapsed ascending colon, poorly evaluated without oral contrast. This finding is most likely artifactual due to underdistention given the patient's age. A follow-up CT abdomen/pelvis with oral and IV contrast may be considered when clinically feasible. If the patient has risk factors  for colonic neoplasm, colonoscopy correlation may be considered. 2. Otherwise, no lymphadenopathy or other findings suspicious for neoplastic disease in the chest, abdomen or pelvis.  Micro Data:  MRSA 9/28 > Negative SARS CoV2 9/28 > Negative   Antimicrobials:  N/A   Interim history/subjective:  Awake, eyes open and tolerating intermittent pressure support on propofol 10, fentanyl.  Still some periods of apnea on this level of sedation  Objective   Blood pressure (!) 99/58, pulse 81, temperature 99.3 F (37.4 C), temperature source Axillary, resp. rate 16, height 5\' 8"  (1.727 m), weight 71.8 kg, SpO2 100 %.    Vent Mode: PRVC FiO2 (%):  [30 %-60 %] 30 % Set Rate:  [12 bmp-18 bmp] 12 bmp Vt Set:  [480 mL-550 mL] 480 mL PEEP:  [5 cmH20] 5 cmH20 Plateau Pressure:  [11 cmH20-13 cmH20] 11 cmH20   Intake/Output Summary (Last 24 hours) at 10/14/2018 0901 Last data filed at 10/14/2018 0800 Gross per 24 hour  Intake 2987.72 ml  Output 2925 ml  Net 62.72 ml   Filed Weights   10/13/18 1038  Weight: 71.8 kg    Examination: General: Young man appears his stated age, intubated and ventilated HENT: ET tube in place, no oral secretions, left cranial dressing intact, clean Lungs: Clear bilaterally, no wheezes, no crackles Cardiovascular: Regular, no murmur Abdomen: Nondistended, positive bowel sounds Extremities: No edema Neuro: Awake, tracks although left eye is closed and a bit swollen, right pupil reacts, left was not tested, moves bilateral upper extremities spontaneously  Resolved Hospital Problem list     Assessment & Plan:   Hematoma in Left Temporal Lobe in setting of tumor s/p Left Craniotomy with tumor  resection and evacuation of hematoma  Plan -Dr. Venetia Constable, neurosurgery managing post resection -Frequent serial neuro checks -Planning for repeat MRI brain today 9/29 -Dexamethasone 10 mg IV every 6 hours -Pathology results pending  Respiratory Insufficieny in  post-operative setting  Plan -PRVC 8 cc/kg.  Intermittently tolerated some PSV this morning.  Would like to progress to dedicated SBT today after he returns from MRI brain and if no contraindications based on that imaging, neurosurgery plans -Continue low-dose propofol, fentanyl, RASS goal -1  Presumed Reactive Leucocytosis Plan  -Following clinically off scheduled antibiotics    Best practice:  Diet: NPO Pain/Anxiety/Delirium protocol VAP protocol DVT prophylaxis: holding in setting of Hemorrhage; no heparin until POD#3 GI prophylaxis: PPI Glucose control: Trend Glucose  Mobility: Bedrest Code Status: FC Family Communication: Mother at bedside early 9/29 Disposition: ICU  Labs   CBC: Recent Labs  Lab 10/13/18 0630 10/13/18 0818 10/13/18 1936 10/13/18 2141 10/14/18 0342 10/14/18 0441  WBC 12.6*  --   --   --  14.1*  --   NEUTROABS 10.7*  --   --   --   --   --   HGB 15.9 16.3 11.9* 12.2* 11.6* 11.2*  HCT 47.6 48.0 35.0* 36.0* 34.5* 33.0*  MCV 88.5  --   --   --  87.8  --   PLT 186  --   --   --  170  --     Basic Metabolic Panel: Recent Labs  Lab 10/13/18 0630 10/13/18 0818 10/13/18 1936 10/13/18 2141 10/14/18 0342 10/14/18 0441  NA 137 138 138 140 138 140  K 4.4 4.3 4.3 4.1 4.0 3.9  CL 101 102  --   --  106  --   CO2 26  --   --   --  20*  --   GLUCOSE 118* 110*  --   --  157*  --   BUN 20 23*  --   --  14  --   CREATININE 1.02 0.90  --   --  1.08  --   CALCIUM 9.1  --   --   --  8.9  --   MG  --   --   --   --  1.8  --   PHOS  --   --   --   --  3.5  --    GFR: Estimated Creatinine Clearance: 103.8 mL/min (by C-G formula based on SCr of 1.08 mg/dL). Recent Labs  Lab 10/13/18 0630 10/14/18 0342  WBC 12.6* 14.1*    Liver Function Tests: Recent Labs  Lab 10/13/18 0630  AST 20  ALT 18  ALKPHOS 56  BILITOT 0.5  PROT 7.3  ALBUMIN 4.1   No results for input(s): LIPASE, AMYLASE in the last 168 hours. No results for input(s): AMMONIA in the  last 168 hours.  ABG    Component Value Date/Time   PHART 7.414 10/14/2018 0441   PCO2ART 33.7 10/14/2018 0441   PO2ART 211.0 (H) 10/14/2018 0441   HCO3 21.5 10/14/2018 0441   TCO2 23 10/14/2018 0441   ACIDBASEDEF 2.0 10/14/2018 0441   O2SAT 100.0 10/14/2018 0441     Coagulation Profile: Recent Labs  Lab 10/13/18 0630  INR 1.1    Cardiac Enzymes: No results for input(s): CKTOTAL, CKMB, CKMBINDEX, TROPONINI in the last 168 hours.  HbA1C: No results found for: HGBA1C  CBG: Recent Labs  Lab 10/13/18 0625  GLUCAP 139*     Critical care time: 33 minutes  Baltazar Apo, MD, PhD 10/14/2018, 9:12 AM [facility] [phone] or if no answer [phone]

## 2018-10-14 NOTE — Progress Notes (Signed)
Events of yesterday noted. Patient decompensated yesterday and pupil was blown, taken for emergent evacuation. Pathology pending.   Today, he follows commands to close eyes, but no appendicular commands. R paresis.   Impression: hemorrhagic neoplastic lesion.   Neurology is available as needed moving forward, please call with questions or concerns.   Roland Rack, MD [facility] [phone]  If 7pm- 7am, please page neurology on call as listed in [place].

## 2018-10-14 NOTE — Progress Notes (Signed)
RT note-Initiated wean, RN slowly coming off of sedation.

## 2018-10-14 NOTE — Evaluation (Signed)
Occupational Therapy Evaluation Patient Details Name: Gregory Moreno MRN: [id number] DOB: 1995/08/25 Today's Date: 10/14/2018    History of Present Illness 23 year old male presents to ED on 9/28 with left sided headache. On arrival to ED appeared confused, noted right sided weakness. Code Stroke Activated. CT Head/MRI with hematoma in left temporal lobe with encapsulation of a high density rim suspicious for a underlying mass. Neurosurgery consulted. Taken to OR for craniotomy for tumor resection and hemorrhage evacuation   Clinical Impression   This 23 y/o male presents with the above. PTA pt independent with ADL, iADL and functional mobility, was a full time Electronics engineer. Pt presenting with impaired cognition, R side weakness, decreased sitting/standing balance, and questionable visual deficits impacting his functional performance. Pt currently requiring +2 assist for bed mobility and sit<>stand from EOB, able to briefly maintain static sitting balance EOB with close minguard assist but overall requiring up to maxA. Pt currently requires max-totalA for ADL. [name] mother present and supportive during session, reports family able to provide 24hr supervision/assist at time of discharge. He will benefit from continued acute OT services and feel he is an excellent candidate for CIR level services at time of discharge to progress pt towards PLOF. Will follow.     Follow Up Recommendations  CIR;Supervision/Assistance - 24 hour    Equipment Recommendations  Other (comment)(TBD)           Precautions / Restrictions Precautions Precautions: Fall Precaution Comments: L crani Restrictions Weight Bearing Restrictions: No      Mobility Bed Mobility Overal bed mobility: Needs Assistance Bed Mobility: Supine to Sit;Sit to Supine     Supine to sit: Mod assist;+2 for physical assistance;+2 for safety/equipment Sit to supine: Max assist;+2 for physical assistance;+2 for safety/equipment    General bed mobility comments: assist for RLE over EOB and trunk elevation; max multimodal cues for assisting throughout  Transfers Overall transfer level: Needs assistance Equipment used: 2 person hand held assist Transfers: Sit to/from Stand Sit to Stand: Mod assist;+2 physical assistance;+2 safety/equipment         General transfer comment: use of recliner back for UE support during sit<>stand; +2 assist to rise and steady with R knee block provided; pt initially with L lateral lean, able to correct but fatigues quickly; +2 assist for static balance    Balance Overall balance assessment: Needs assistance Sitting-balance support: Feet supported;Single extremity supported;Bilateral upper extremity supported Sitting balance-Leahy Scale: Poor Sitting balance - Comments: requires maxA for sitting balance; briefly able to maintain without assist with close minguard; tends to push with LUE   Standing balance support: Bilateral upper extremity supported;Single extremity supported Standing balance-Leahy Scale: Poor Standing balance comment: +2 assist for balance                           ADL either performed or assessed with clinical judgement   ADL Overall ADL's : Needs assistance/impaired Eating/Feeding: NPO   Grooming: Maximal assistance;Bed level;Sitting       Lower Body Bathing: Maximal assistance;Total assistance;Bed level Lower Body Bathing Details (indicate cue type and reason): pt incontinent of urine while standing requiring assist to wash LEs Upper Body Dressing : Maximal assistance;Total assistance;Bed level Upper Body Dressing Details (indicate cue type and reason): donning new gown Lower Body Dressing: Total assistance Lower Body Dressing Details (indicate cue type and reason): doffing/donning new socks             Functional mobility during  ADLs: Moderate assistance;Maximal assistance;+2 for physical assistance;+2 for safety/equipment General ADL  Comments: pt with R side weakness, impaired cognition, decreased sitting/standing balance     Vision Baseline Vision/History: No visual deficits Patient Visual Report: Other (comment)(L eye ptosis) Additional Comments: pt unable to communicate whether he has blurred vision or double; max cuing to locate mom, therapist in room at times      Perception     Praxis      Pertinent Vitals/Pain Pain Assessment: Faces Faces Pain Scale: No hurt Pain Intervention(s): Monitored during session     Hand Dominance Right   Extremity/Trunk Assessment Upper Extremity Assessment Upper Extremity Assessment: RUE deficits/detail;LUE deficits/detail RUE Deficits / Details: grossly 1/5 throughout, pt using LUE to assist with RUE ROM when assessing RUE Sensation: decreased light touch RUE Coordination: decreased fine motor;decreased gross motor LUE Deficits / Details: pt with discoordination noted with attempts to use LUE; strength appears in tact LUE Coordination: decreased fine motor   Lower Extremity Assessment Lower Extremity Assessment: Defer to PT evaluation   Cervical / Trunk Assessment Cervical / Trunk Assessment: Other exceptions Cervical / Trunk Exceptions: difficulty maintaining upright posture, tending to have cervical flexion towards R   Communication Communication Communication: Expressive difficulties   Cognition Arousal/Alertness: Awake/alert;Lethargic Behavior During Therapy: Flat affect Overall Cognitive Status: Impaired/Different from baseline Area of Impairment: Attention;Memory;Following commands;Safety/judgement;Problem solving                   Current Attention Level: Sustained Memory: Decreased recall of precautions;Decreased short-term memory Following Commands: Follows one step commands inconsistently;Follows one step commands with increased time Safety/Judgement: Decreased awareness of safety;Decreased awareness of deficits   Problem Solving: Slow  processing;Decreased initiation;Difficulty sequencing;Requires verbal cues;Requires tactile cues General Comments: pt requires multimodal cues for command following; intermittently responding to questions given increased time to do so, at times pt attempting to verbalize but difficult to understand   General Comments  pt's mother present during session; HR up to 130s with activity, otherwise VSS    Exercises     Shoulder Instructions      Home Living Family/patient expects to be discharged to:: Private residence Living Arrangements: Parent(can stay at [place]) Available Help at Discharge: Family;Available 24 hours/day Type of Home: House Home Access: Stairs to enter [employer] of Steps: 1   Home Layout: Two level;Able to live on main level with bedroom/bathroom(pt's bedroom on main level)     Bathroom Shower/Tub: Teacher, early years/pre: Standard     Home Equipment: None          Prior Functioning/Environment Level of Independence: Independent        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), has his pilot's license        OT Problem List: Decreased strength;Decreased range of motion;Decreased activity tolerance;Impaired balance (sitting and/or standing);Impaired vision/perception;Decreased coordination;Decreased cognition;Decreased safety awareness;Decreased knowledge of use of DME or AE;Decreased knowledge of precautions;Obesity;Impaired sensation;Pain;Impaired UE functional use      OT Treatment/Interventions: Self-care/ADL training;Therapeutic exercise;Neuromuscular education;Energy conservation;DME and/or AE instruction;Therapeutic activities;Visual/perceptual remediation/compensation;Patient/family education;Cognitive remediation/compensation;Balance training    OT Goals(Current goals can be found in the care plan section) Acute Rehab OT Goals Patient Stated Goal: none stated OT Goal Formulation: With patient Time For Goal  Achievement: 10/28/18 Potential to Achieve Goals: Good  OT Frequency: Min 3X/week   Barriers to D/C:            Co-evaluation PT/OT/SLP Co-Evaluation/Treatment: Yes Reason for Co-Treatment: Complexity of the patient's impairments (multi-system involvement);For  patient/therapist safety;To address functional/ADL transfers   OT goals addressed during session: Strengthening/ROM;ADL's and self-care      AM-PAC OT "6 Clicks" Daily Activity     Outcome Measure Help from another person eating meals?: Total(NPO) Help from another person taking care of personal grooming?: Total Help from another person toileting, which includes using toliet, bedpan, or urinal?: Total Help from another person bathing (including washing, rinsing, drying)?: Total Help from another person to put on and taking off regular upper body clothing?: Total Help from another person to put on and taking off regular lower body clothing?: Total 6 Click Score: 6   End of Session Equipment Utilized During Treatment: Gait belt Nurse Communication: Mobility status  Activity Tolerance: Patient tolerated treatment well Patient left: in bed;with bed alarm set;with family/visitor present  OT Visit Diagnosis: Other symptoms and signs involving cognitive function;Hemiplegia and hemiparesis;Unsteadiness on feet (R26.81) Hemiplegia - Right/Left: Right Hemiplegia - dominant/non-dominant: Dominant                Time: [id number] OT Time Calculation (min): 35 min Charges:  OT General Charges $OT Visit: 1 Visit OT Evaluation $OT Eval Moderate Complexity: 1 Mod  Lou Cal, OT [employer] Pager [phone] Office [phone]   Raymondo Band 10/14/2018, 4:48 PM

## 2018-10-15 ENCOUNTER — Inpatient Hospital Stay (HOSPITAL_COMMUNITY): Payer: 59

## 2018-10-15 DIAGNOSIS — I611 Nontraumatic intracerebral hemorrhage in hemisphere, cortical: Secondary | ICD-10-CM

## 2018-10-15 LAB — BASIC METABOLIC PANEL
Anion gap: 11 (ref 5–15)
BUN: 15 mg/dL (ref 6–20)
CO2: 23 mmol/L (ref 22–32)
Calcium: 9.1 mg/dL (ref 8.9–10.3)
Chloride: 103 mmol/L (ref 98–111)
Creatinine, Ser: 0.86 mg/dL (ref 0.61–1.24)
GFR calc Af Amer: >60 mL/min (ref >60)
GFR calc non Af Amer: >60 mL/min (ref >60)
Glucose, Bld: 140 mg/dL — ABNORMAL HIGH (ref 70–99)
Potassium: 3.9 mmol/L (ref 3.5–5.1)
Sodium: 137 mmol/L (ref 135–145)

## 2018-10-15 LAB — CBC
HCT: 34 % — ABNORMAL LOW (ref 39.0–52.0)
Hemoglobin: 11.8 g/dL — ABNORMAL LOW (ref 13.0–17.0)
MCH: 30 pg (ref 26.0–34.0)
MCHC: 34.7 g/dL (ref 30.0–36.0)
MCV: 86.5 fL (ref 80.0–100.0)
Platelets: 196 10*3/uL (ref 150–400)
RBC: 3.93 MIL/uL — ABNORMAL LOW (ref 4.22–5.81)
RDW: 12.7 % (ref 11.5–15.5)
WBC: 21.2 10*3/uL — ABNORMAL HIGH (ref 4.0–10.5)
nRBC: 0 % (ref 0.0–0.2)

## 2018-10-15 LAB — MAGNESIUM: Magnesium: 2.3 mg/dL (ref 1.7–2.4)

## 2018-10-15 MED ORDER — BACLOFEN 1 MG/ML ORAL SUSPENSION
5.0000 mg | Freq: Three times a day (TID) | ORAL | Status: DC | PRN
  Administered 2018-10-15: 5 mg via ORAL
  Filled 2018-10-15 (×3): qty 0.5

## 2018-10-15 MED ORDER — DEXAMETHASONE SODIUM PHOSPHATE 4 MG/ML IJ SOLN
4.0000 mg | Freq: Two times a day (BID) | INTRAMUSCULAR | Status: DC
  Administered 2018-10-15 – 2018-10-16 (×3): 4 mg via INTRAVENOUS
  Filled 2018-10-15 (×3): qty 1

## 2018-10-15 NOTE — Progress Notes (Signed)
PCCM Interval Note  Extubated successfully on 9/29.  Good airway protection, no secretions, comfortable respiratory pattern.   Pathology still pending  Please call if we can assist in any way  Baltazar Apo, MD, PhD 10/15/2018, 8:24 AM [facility] [phone] or if no answer [phone]

## 2018-10-15 NOTE — Evaluation (Signed)
Speech Language Pathology Evaluation Patient Details Name: Gregory Moreno MRN: [id number] DOB: 14-Jan-1996 Today's Date: 10/15/2018 Time: [id number] SLP Time Calculation (min) (ACUTE ONLY): 27 min  Problem List:  Patient Active Problem List   Diagnosis Date Noted  . ICH (intracerebral hemorrhage) ([place]) 10/13/2018   Past Medical History: History reviewed. No pertinent past medical history. Past Surgical History:  Past Surgical History:  Procedure Laterality Date  . CRANIOTOMY Left 10/13/2018   Procedure: LEFT CRANIOTOMY FOR TUMOR RESECTION;  Surgeon: Judith Part, MD;  Location: [facility];  Service: Neurosurgery;  Laterality: Left;   HPI:  Gregory Moreno) is a 23 yo M who presented to ED on 9/28 with acute onset headache, nausea and vomiting, progressive R weakness, facial droop, and severe aphasia. CT Head/MRI showed hematoma in L temporal lobe with encapsulation of a high density rim suspicious for a underlying mass. Craniotomy for tumor resection and hemorrhage evacuation performed 9/28 1428. Intubated for procedure and extubated 9/29 at 1139. Diet currently clear liquid.    Assessment / Plan / Recommendation Clinical Impression  Pt presented lethargic in chair 2 hours post PT session. Mom and RN reported previous vocalizations on behalf of the pt but none were elicited given max cues during evaluation. Spontaneously vocalized at end of eval x1. Information regarding language and cognition are limited due to impaired focused attention, lethargy and absence of expressive langauge or attempts to relay need via gestures. He maintained eye contact given mod verbal/visual/tactile cues for periods of 3-4 seconds before shifting. He maintained focus on YES/NO communicative aid for appx 10 seconds and pointed to yes correctly in one out of two opportunities. With 1 step simple commands he followed 1/8. No response durng automatic speech tasks (sing, counting with visual and verbal cue).  Afformentioned lethargy and decreased endurance was taken into account during assessment. Mom reports pt conveyed message last night via 2 word phrase and followed one step commands. Will continue to assess pt's expressive/receptive language, cognition,and motor speech abilities in diagnostic treatment.     SLP Assessment  SLP Recommendation/Assessment: Patient needs continued Speech Lanaguage Pathology Services SLP Visit Diagnosis: Aphonia (R49.1);Cognitive communication deficit (R41.841)    Follow Up Recommendations  Inpatient Rehab    Frequency and Duration min 2x/week  2 weeks      SLP Evaluation Cognition  Overall Cognitive Status: Impaired/Different from baseline Arousal/Alertness: Lethargic Orientation Level: Other (comment)(No response to questions) Attention: Focused Focused Attention: Impaired Focused Attention Impairment: Functional basic Memory: (to assess further) Awareness: (will assess further) Problem Solving: (TBA further) Safety/Judgment: Impaired       Comprehension  Auditory Comprehension Overall Auditory Comprehension: Impaired Yes/No Questions: Impaired Basic Biographical Questions: Other (comment)(No response) Basic Immediate Environment Questions: Other (comment)(No response) Commands: Impaired One Step Basic Commands: 0-24% accurate(Followed 3 commands during session) Interfering Components: Attention Visual Recognition/Discrimination Discrimination: Not tested Reading Comprehension Reading Status: Impaired(TBA) Word level: Impaired Sentence Level: Not tested Paragraph Level: Not tested Functional Environmental (signs, name badge): Not tested    Expression Expression Primary Mode of Expression: Other (comment)(nonverbal) Verbal Expression Overall Verbal Expression: Impaired Initiation: Impaired Automatic Speech: (no response to singing) Level of Generative/Spontaneous Verbalization: (one vocalization) Naming: (no response) Pragmatics:  Impairment Impairments: Eye contact;Abnormal affect Interfering Components: Attention Non-Verbal Means of Communication: Not applicable Written Expression Dominant Hand: Right Written Expression: Not tested   Oral / Motor  Oral Motor/Sensory Function Overall Oral Motor/Sensory Function: Other (comment)(PT unable to follow commands/answer questions) Facial ROM: Reduced right;Suspected CN VII (facial) dysfunction Facial  Symmetry: Abnormal symmetry right Facial Strength: Reduced right;Suspected CN VII (facial) dysfunction Motor Speech Overall Motor Speech: Impaired Respiration: Within functional limits Phonation: Low vocal intensity Motor Planning: (will )                       Houston Siren 10/15/2018, 3:09 PM  Orbie Pyo Colvin Caroli.Ed Risk analyst [phone] Office [phone]

## 2018-10-15 NOTE — Consult Note (Signed)
Inpatient Rehabilitation Admissions Coordinator  Inpatient rehab consult received. I met with patient with his Mom at bedside. We discussed goals and expectations of an inpt rehab admit. She is in agreement. I await further progress with therapy before proceeding with insurance authorization for an inpt rehab admission.   , RN, MSN Rehab Admissions Coordinator [phone] 10/15/2018 11:36 AM  

## 2018-10-15 NOTE — Discharge Summary (Addendum)
Discharge Summary  Date of Admission: 10/13/2018  Date of Discharge: 10/21/2018  Attending Physician: Emelda Brothers, MD  Hospital Course: Patient was admitted with acute onset headache, vomiting, aphasia and right sided weakness. CT/MRI showed a left temporal hemorrhage with likely underlying tumor. He was taken to the OR for craniotomy for hematoma evacuation and tumor biopsy. In preop, he had a further decompensation with obtundation and a blown right pupil so he was taken emergently to the OR. He was left intubated post-operatively but began opening his eyes and following some commands. A post-op CTH showed good clot evacuation and a post-op MRI showed resection of the enhancing portion of the tumor. Due to edema around the hemorrhage, it was difficult to evaluate for any residual non-enhancing tumor. He was extubated on POD1 and seen by PT/OT, who recommended CIR placement. He had intractable hiccoughs, so baclofen was started, which helped. Final pathology was pending at the time of discharge. He was discharged to CIR on 10/21/2018  Neurologic exam at discharge:  Awake/alert, L partial oculomotor palsy, minimal verbal output, follows some simple commands, strength 5/5 on L, 0/5 on R with preserved tone, SILT x4  Discharge diagnosis: Hemorrhagic brain tumor  Judith Part, MD 10/15/18 3:26 PM

## 2018-10-15 NOTE — Progress Notes (Signed)
SLP Cancellation Note  Patient Details Name: Gregory Moreno MRN: [id number] DOB: 1995/11/20   Cancelled treatment:        As SLP initiating bedside swallow, pt began to hiccup significantly which would affect respiration/swallow reciprocity. RN reported pt pocketing jello this am and mom stated she cued him to use tongue to remove. Will continue clear liquids for now and will plan to see tomorrow.   Houston Siren 10/15/2018, 3:10 PM

## 2018-10-15 NOTE — Progress Notes (Signed)
Physical Therapy Treatment Patient Details Name: Gregory Moreno MRN: [id number] DOB: [birth year] Today's Date: 10/15/2018    History of Present Illness 23 year old male presents to ED on 9/28 with left sided headache. On arrival to ED appeared confused, noted right sided weakness. Code Stroke Activated. CT Head/MRI with hematoma in left temporal lobe with encapsulation of a high density rim suspicious for a underlying mass. Neurosurgery consulted. Taken to OR for craniotomy for tumor resection and hemorrhage evacuation    PT Comments    Pt making steady progress towards physical therapy goals. Pt able to follow simple commands intermittently with multimodal cueing. Session focused on sitting balance exercises to promote trunk control and coordination and subsequent transfer to chair for out of bed mobility. Requiring two person maximal assist for low pivot transfer to chair. Noted minimal RLE muscle activation today. Remains an excellent candidate for CIR based on age, PLOF, and family support.     Follow Up Recommendations  CIR     Equipment Recommendations  Other (comment)(TBA)    Recommendations for Other Services       Precautions / Restrictions Precautions Precautions: Fall Precaution Comments: L crani Restrictions Weight Bearing Restrictions: No    Mobility  Bed Mobility Overal bed mobility: Needs Assistance Bed Mobility: Supine to Sit     Supine to sit: +2 for physical assistance;+2 for safety/equipment;Max assist     General bed mobility comments: Pt able to initiate LLE movement, maxA + 2 to progress to edge of bed with assist for trunk and RLE  Transfers Overall transfer level: Needs assistance   Transfers: Squat Pivot Transfers Sit to Stand: +2 physical assistance;+2 safety/equipment;Max assist         General transfer comment: MaxA + 2 for low pivot transfer towards left, pt able to push through LUE/LLE minimally with multimodal cues.    Ambulation/Gait                 Stairs             Wheelchair Mobility    Modified Rankin (Stroke Patients Only) Modified Rankin (Stroke Patients Only) Pre-Morbid Rankin Score: No symptoms Modified Rankin: Severe disability     Balance Overall balance assessment: Needs assistance Sitting-balance support: Feet supported;Single extremity supported;Bilateral upper extremity supported Sitting balance-Leahy Scale: Poor Sitting balance - Comments: requires maxA for sitting balance; tends to push with LUE                                    Cognition Arousal/Alertness: Awake/alert Behavior During Therapy: Flat affect Overall Cognitive Status: Impaired/Different from baseline Area of Impairment: Attention;Memory;Following commands;Safety/judgement;Problem solving                   Current Attention Level: Sustained Memory: Decreased recall of precautions;Decreased short-term memory Following Commands: Follows one step commands inconsistently;Follows one step commands with increased time Safety/Judgement: Decreased awareness of safety;Decreased awareness of deficits   Problem Solving: Slow processing;Decreased initiation;Difficulty sequencing;Requires verbal cues;Requires tactile cues General Comments: pt requires multimodal cues for command following; making attempts to verbalize but unintelligible      Exercises Other Exercises Other Exercises: Sitting balance: right lateral leans x 2, anterior/posterior perturbations, cervical stretching into neutral (tends to rest in right lateral flexion), scapular retraction stretch    General Comments        Pertinent Vitals/Pain Pain Assessment: Faces Faces Pain Scale: No hurt  Home Living                      Prior Function            PT Goals (current goals can now be found in the care plan section) Acute Rehab PT Goals Patient Stated Goal: none stated Potential to Achieve  Goals: Good Progress towards PT goals: Progressing toward goals    Frequency    Min 4X/week      PT Plan Current plan remains appropriate    Co-evaluation              AM-PAC PT "6 Clicks" Mobility   Outcome Measure  Help needed turning from your back to your side while in a flat bed without using bedrails?: A Lot Help needed moving from lying on your back to sitting on the side of a flat bed without using bedrails?: Total Help needed moving to and from a bed to a chair (including a wheelchair)?: Total Help needed standing up from a chair using your arms (e.g., wheelchair or bedside chair)?: Total Help needed to walk in hospital room?: Total Help needed climbing 3-5 steps with a railing? : Total 6 Click Score: 7    End of Session   Activity Tolerance: Patient limited by fatigue Patient left: with call bell/phone within reach;with family/visitor present;in chair;with chair alarm set Nurse Communication: Mobility status PT Visit Diagnosis: Other abnormalities of gait and mobility (R26.89);Other symptoms and signs involving the nervous system (R29.898);Hemiplegia and hemiparesis Hemiplegia - Right/Left: Right Hemiplegia - dominant/non-dominant: Dominant Hemiplegia - caused by: Other Nontraumatic intracranial hemorrhage     Time: [id number] PT Time Calculation (min) (ACUTE ONLY): 32 min  Charges:  $Therapeutic Activity: 8-22 mins $Neuromuscular Re-education: 8-22 mins                     Ellamae Sia, PT, DPT Acute Rehabilitation Services Pager [phone] Office [phone]    Willy Eddy 10/15/2018, 1:20 PM

## 2018-10-15 NOTE — Progress Notes (Addendum)
Neurosurgery Service Progress Note  Subjective: No acute events overnight   Objective: Vitals:   10/15/18 0400 10/15/18 0500 10/15/18 0600 10/15/18 0700  BP: 123/67 123/75 94/79 135/70  Pulse: 89 71 60 69  Resp:  18 17 17   Temp:      TempSrc:      SpO2: 99% 98% 98% 99%  Weight:      Height:       Temp (24hrs), Avg:99.6 F (37.6 C), Min:99.3 F (37.4 C), Max:100 F (37.8 C)  CBC Latest Ref Rng & Units 10/14/2018 10/14/2018 10/13/2018  WBC 4.0 - 10.5 K/uL - 14.1(H) -  Hemoglobin 13.0 - 17.0 g/dL 11.2(L) 11.6(L) 12.2(L)  Hematocrit 39.0 - 52.0 % 33.0(L) 34.5(L) 36.0(L)  Platelets 150 - 400 K/uL - 170 -   BMP Latest Ref Rng & Units 10/14/2018 10/14/2018 10/13/2018  Glucose 70 - 99 mg/dL - 157(H) -  BUN 6 - 20 mg/dL - 14 -  Creatinine 0.61 - 1.24 mg/dL - 1.08 -  Sodium 135 - 145 mmol/L 140 138 140  Potassium 3.5 - 5.1 mmol/L 3.9 4.0 4.1  Chloride 98 - 111 mmol/L - 106 -  CO2 22 - 32 mmol/L - 20(L) -  Calcium 8.9 - 10.3 mg/dL - 8.9 -    Intake/Output Summary (Last 24 hours) at 10/15/2018 0759 Last data filed at 10/15/2018 0600 Gross per 24 hour  Intake 1670.02 ml  Output 1275 ml  Net 395.02 ml    Current Facility-Administered Medications:  .  0.9 %  sodium chloride infusion, , Intravenous, Continuous, Laiden Milles, Joyice Faster, MD, Last Rate: 75 mL/hr at 10/15/18 0600 .  0.9 %  sodium chloride infusion, , Intravenous, Continuous, Misti Towle, Joyice Faster, MD, Last Rate: 10 mL/hr at 10/13/18 1250 .  acetaminophen (TYLENOL) tablet 650 mg, 650 mg, Oral, Q4H PRN **OR** acetaminophen (TYLENOL) suppository 650 mg, 650 mg, Rectal, Q4H PRN, Ladarren Steiner, Joyice Faster, MD .  Chlorhexidine Gluconate Cloth 2 % PADS 6 each, 6 each, Topical, Daily, Judith Part, MD, 6 each at 10/13/18 1153 .  dexamethasone (DECADRON) injection 4 mg, 4 mg, Intravenous, Q12H, Wania Longstreth A, MD .  docusate sodium (COLACE) capsule 100 mg, 100 mg, Oral, BID, Marshun Duva A, MD .  influenza vac split  quadrivalent PF (FLUARIX) injection 0.5 mL, 0.5 mL, Intramuscular, Tomorrow-1000, Irean Kendricks A, MD .  ondansetron (ZOFRAN) tablet 4 mg, 4 mg, Oral, Q4H PRN **OR** ondansetron (ZOFRAN) injection 4 mg, 4 mg, Intravenous, Q4H PRN, Judith Part, MD, 4 mg at 10/13/18 1153 .  pantoprazole (PROTONIX) injection 40 mg, 40 mg, Intravenous, Q24H, Eubanks, Katalina M, NP, 40 mg at 10/15/18 0010 .  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 Resection converted to emergent hematoma evacuation without navigation due to preop decompensation. MRI with good evacuation, ischemic sequelae of herniation  -path pending -dex to 4bid -transfer to stepdown -will start baclofen for hiccoughs -SCDs/TEDs, hold SQH until tomorrow  Judith Part  10/15/18 7:59 AM

## 2018-10-16 MED ORDER — HEPARIN SODIUM (PORCINE) 5000 UNIT/ML IJ SOLN
5000.0000 [IU] | Freq: Three times a day (TID) | INTRAMUSCULAR | Status: DC
  Administered 2018-10-16 – 2018-10-21 (×17): 5000 [IU] via SUBCUTANEOUS
  Filled 2018-10-16 (×17): qty 1

## 2018-10-16 NOTE — Progress Notes (Signed)
  Speech Language Pathology Treatment: Cognitive-Linquistic  Patient Details Name: Gregory Moreno MRN: [id number] DOB: July 25, 1995 Today's Date: 10/16/2018 Time: [id number] SLP Time Calculation (min) (ACUTE ONLY): 13 min  Assessment / Plan / Recommendation Clinical Impression  Pts ability to maintain an awake state improved today. Seen to encourage communicative strategies and continue gauging language comprehension and cognition. Impaired sustained attention with minimal responsiveness during tx session, with R eye staying open but drifting to the R after eye contact. Suspect pt would have right field disturbance but attends to right more than left although his left eye has ptosis. Pt requiring visual cues and frequently tactile cues to initiate movement. He correctly pointed to 3/3 written numbers given 5 options, demonstrating retained vision and decoding. Pt independently used thumbs up gesture to communicate yes x 1at beginning of session, but did not respond to y/n questions with max multimodal cueing when targeted. Did not exhibit articulatory movement or vocalization during automatic speech via singing. He raised his hand after 2 verbal, 1 visual, and 1 tactile cue and waved goodbye in response to visual cue at conclusion of session. No verbalizations or mouthing present yet. Continue current plan of care.   HPI HPI: Gregory Moreno) is a 23 yo M who presented to ED on 9/28 with acute onset headache, nausea and vomiting, progressive R weakness, facial droop, and severe aphasia. CT Head/MRI showed hematoma in L temporal lobe with encapsulation of a high density rim suspicious for a underlying mass. Craniotomy for tumor resection and hemorrhage evacuation performed 9/28 1428. Intubated for procedure and extubated 9/29 at 1139. Diet currently clear liquid.       SLP Plan  Continue with current plan of care       Recommendations  Medication Administration: Crushed with puree Compensations:  Slow rate;Small sips/bites;Lingual sweep for clearance of pocketing                Oral Care Recommendations: Oral care BID;Staff/trained caregiver to provide oral care Follow up Recommendations: Inpatient Rehab SLP Visit Diagnosis: Dysphagia, unspecified (R13.10) Attention and concentration deficit following: Other cerebrovascular disease(Brain tumor) Plan: Continue with current plan of care                       Gregory Moreno 10/16/2018, 11:06 AM

## 2018-10-16 NOTE — Progress Notes (Signed)
Physical Therapy Treatment Patient Details Name: Gregory Moreno MRN: [id number] DOB: 12-14-1995 Today's Date: 10/16/2018    History of Present Illness 23 year old male presents to ED on 9/28 with left sided headache. On arrival to ED appeared confused, noted right sided weakness. Code Stroke Activated. CT Head/MRI with hematoma in left temporal lobe with encapsulation of a high density rim suspicious for a underlying mass. Neurosurgery consulted. Taken to OR for craniotomy for tumor resection and hemorrhage evacuation    PT Comments    Pt progressing towards physical therapy goals. Noted minimal right shoulder and hip internal rotation today in addition to right elbow extensor tone. Able to stand two times with support of back of recliner and maximal assist. Pt able to accept weight onto right lower extremity and no knee buckle noted. Transferred to chair with two person maximal assist. Pt continues with right sided hemiplegia, aphasia, decreased cognition, balance impairments, decreased activity tolerance, abnormal tone. Continue to recommend comprehensive inpatient rehab (CIR) for post-acute therapy needs. Pt is an excellent candidate based on age, PLOF, and family support.      Follow Up Recommendations  CIR     Equipment Recommendations  Other (comment)(TBA)    Recommendations for Other Services       Precautions / Restrictions Precautions Precautions: Fall Precaution Comments: L crani Restrictions Weight Bearing Restrictions: No    Mobility  Bed Mobility Overal bed mobility: Needs Assistance Bed Mobility: Supine to Sit     Supine to sit: +2 for physical assistance;+2 for safety/equipment;Max assist     General bed mobility comments: Pt able to initiate LLE movement, maxA + 2 to progress to edge of bed with assist for trunk and RLE  Transfers Overall transfer level: Needs assistance   Transfers: Squat Pivot Transfers;Sit to/from Stand Sit to Stand: +2  safety/equipment;Max assist   Squat pivot transfers: Max assist;+2 physical assistance     General transfer comment: Pt requiring maxA to stand x2 using left hand to pull up from handle on back of recliner. Decreased initiation, however, able to activate hip extension for power up and no right knee buckle noted in standing. MaxA + 2 for squat pivot transfer towards left   Ambulation/Gait             General Gait Details: unable   Stairs             Wheelchair Mobility    Modified Rankin (Stroke Patients Only) Modified Rankin (Stroke Patients Only) Pre-Morbid Rankin Score: No symptoms Modified Rankin: Severe disability     Balance Overall balance assessment: Needs assistance Sitting-balance support: Feet supported;Single extremity supported;Bilateral upper extremity supported Sitting balance-Leahy Scale: Poor Sitting balance - Comments: requires maxA for sitting balance; tends to push with LUE   Standing balance support: Bilateral upper extremity supported;During functional activity Standing balance-Leahy Scale: Poor                              Cognition Arousal/Alertness: Awake/alert Behavior During Therapy: Flat affect Overall Cognitive Status: Impaired/Different from baseline Area of Impairment: Attention;Memory;Following commands;Safety/judgement;Problem solving                   Current Attention Level: Sustained Memory: Decreased recall of precautions;Decreased short-term memory Following Commands: Follows one step commands inconsistently;Follows one step commands with increased time Safety/Judgement: Decreased awareness of safety;Decreased awareness of deficits   Problem Solving: Slow processing;Decreased initiation;Difficulty sequencing;Requires verbal cues;Requires tactile cues General  Comments: pt requires multimodal cues for command following due to decreased attention; visually tracking therapist.  no attempts at verbalization  today      Exercises Other Exercises Other Exercises: Sitting balance: anterior/posterior perturbations    General Comments        Pertinent Vitals/Pain Pain Assessment: Faces Faces Pain Scale: No hurt    Home Living                      Prior Function            PT Goals (current goals can now be found in the care plan section) Acute Rehab PT Goals Patient Stated Goal: none stated Potential to Achieve Goals: Good Progress towards PT goals: Progressing toward goals    Frequency    Min 4X/week      PT Plan Current plan remains appropriate    Co-evaluation              AM-PAC PT "6 Clicks" Mobility   Outcome Measure  Help needed turning from your back to your side while in a flat bed without using bedrails?: Total Help needed moving from lying on your back to sitting on the side of a flat bed without using bedrails?: Total Help needed moving to and from a bed to a chair (including a wheelchair)?: Total Help needed standing up from a chair using your arms (e.g., wheelchair or bedside chair)?: Total Help needed to walk in hospital room?: Total Help needed climbing 3-5 steps with a railing? : Total 6 Click Score: 6    End of Session Equipment Utilized During Treatment: Gait belt Activity Tolerance: Patient tolerated treatment well Patient left: with call bell/phone within reach;with family/visitor present;in chair;with chair alarm set Nurse Communication: Mobility status PT Visit Diagnosis: Other abnormalities of gait and mobility (R26.89);Other symptoms and signs involving the nervous system (R29.898);Hemiplegia and hemiparesis Hemiplegia - Right/Left: Right Hemiplegia - dominant/non-dominant: Dominant Hemiplegia - caused by: Other Nontraumatic intracranial hemorrhage     Time: [id number] PT Time Calculation (min) (ACUTE ONLY): 28 min  Charges:  $Therapeutic Activity: 23-37 mins                     Ellamae Sia, [place], DPT Acute  Rehabilitation Services Pager [phone] Office [phone]    Willy Eddy 10/16/2018, 1:09 PM

## 2018-10-16 NOTE — Evaluation (Signed)
Clinical/Bedside Swallow Evaluation Patient Details  Name: KAEVION ROOT MRN: [id number] Date of Birth: [birth year]  Today's Date: 10/16/2018 Time: SLP Start Time (ACUTE ONLY): 0901 SLP Stop Time (ACUTE ONLY): 0914 SLP Time Calculation (min) (ACUTE ONLY): 13 min  Past Medical History: History reviewed. No pertinent past medical history. Past Surgical History:  Past Surgical History:  Procedure Laterality Date  . CRANIOTOMY Left 10/13/2018   Procedure: LEFT CRANIOTOMY FOR TUMOR RESECTION;  Surgeon: Judith Part, MD;  Location: [facility];  Service: Neurosurgery;  Laterality: Left;   HPI:  Jeyden Konopa Delos Haring) is a 23 yo M who presented to ED on 9/28 with acute onset headache, nausea and vomiting, progressive R weakness, facial droop, and severe aphasia. CT Head/MRI showed hematoma in L temporal lobe with encapsulation of a high density rim suspicious for a underlying mass. Craniotomy for tumor resection and hemorrhage evacuation performed 9/28 1428. Intubated for procedure and extubated 9/29 at 1139. Diet currently clear liquid.    Assessment / Plan / Recommendation Clinical Impression  Pt was more alert this session without hiccups during PO trials. Oral cavity overall WNL, did not follow commands during OME. Given reg solids, pt self-fed w L arm and masticated effectively although slowing down as his attentioned to bolus waned. Pt initiated timely swallow and followed with lingual sweeps independently. No residue or s/sx present after regular. Thins observed via cup, pt spilled anteriorly on R side during initial sip, otherwise showing no signs of oral dysphagia or s/sx of aspiraiton. Given straw he was given verbal cue to take small sips but took 5+ consecutive sips requiring SLP to remove straw. Pt produced no s/sx of aspiration with stable RR. Upgrade to Dys 2 with thin liquids, meds crushed (if not small) in puree. Staff to assist with self feeding, full supervision necessary due to pts  preference for consecutive sips and risk of holding and pocketing 2/2 decreased awareness. SLP to follow for diet toleration and management.  SLP Visit Diagnosis: Dysphagia, unspecified (R13.10) Attention and concentration deficit following: Other cerebrovascular disease(Brain tumor)    Aspiration Risk  Mild aspiration risk    Diet Recommendation Dysphagia 2 (Fine chop);Thin liquid   Liquid Administration via: Straw;Cup Medication Administration: Crushed with puree Supervision: Staff to assist with self feeding;Full supervision/cueing for compensatory strategies Compensations: Slow rate;Small sips/bites;Lingual sweep for clearance of pocketing Postural Changes: Seated upright at 90 degrees    Other  Recommendations Oral Care Recommendations: Oral care BID;Staff/trained caregiver to provide oral care   Follow up Recommendations Inpatient Rehab      Frequency and Duration min 2x/week  2 weeks       Prognosis Prognosis for Safe Diet Advancement: Good Barriers to Reach Goals: Severity of deficits      Swallow Study   General Date of Onset: 10/13/18 HPI: Nihan Holzknecht Delos Haring) is a 23 yo M who presented to ED on 9/28 with acute onset headache, nausea and vomiting, progressive R weakness, facial droop, and severe aphasia. CT Head/MRI showed hematoma in L temporal lobe with encapsulation of a high density rim suspicious for a underlying mass. Craniotomy for tumor resection and hemorrhage evacuation performed 9/28 1428. Intubated for procedure and extubated 9/29 at 1139. Diet currently clear liquid.  Type of Study: Bedside Swallow Evaluation Diet Prior to this Study: Thin liquids;Other (Comment)(Clear liquids) Temperature Spikes Noted: No Respiratory Status: Room air History of Recent Intubation: Yes Length of Intubations (days): 1 days Date extubated: 10/14/18 Behavior/Cognition: Lethargic/Drowsy;Distractible;Requires cueing Oral Cavity Assessment: Within Functional  Limits Oral Care  Completed by SLP: No Oral Cavity - Dentition: Adequate natural dentition Vision: Functional for self-feeding(R gaze preference) Self-Feeding Abilities: Needs assist Patient Positioning: Upright in bed Baseline Vocal Quality: Other (comment)(No attempts at verbalizations)    Oral/Motor/Sensory Function Overall Oral Motor/Sensory Function: Moderate impairment Facial ROM: Reduced right;Suspected CN VII (facial) dysfunction Facial Symmetry: Abnormal symmetry right Facial Strength: Reduced right;Suspected CN VII (facial) dysfunction Lingual ROM: Within Functional Limits Lingual Symmetry: Within Functional Limits Lingual Strength: Reduced Mandible: Within Functional Limits   Ice Chips Ice chips: Not tested   Thin Liquid Thin Liquid: Impaired Presentation: Cup;Spoon;Self Fed Oral Phase Impairments: Poor awareness of bolus Oral Phase Functional Implications: Right anterior spillage    Nectar Thick Nectar Thick Liquid: Not tested   Honey Thick Honey Thick Liquid: Not tested   Puree Puree: Not tested   Solid     Solid: Impaired Presentation: Self Fed Oral Phase Impairments: Other (comment)(generalized weakness, decreased awareness during feeding) Oral Phase Functional Implications: Prolonged oral transit      Tristyn Pharris 10/16/2018,11:04 AM

## 2018-10-16 NOTE — Progress Notes (Signed)
Inpatient Rehabilitation Admissions Coordinator  I will contact Dr. Joaquim Nam to clarify if pt felt to be medically ready to admit to [facility] Friday. If so, I will contact [employer] to begin insurance approval for a possible admit .  Danne Baxter, RN, MSN Rehab Admissions Coordinator [phone] 10/16/2018 11:46 AM

## 2018-10-16 NOTE — Progress Notes (Signed)
Neurosurgery Service Progress Note  Subjective: No acute events overnight   Objective: Vitals:   10/16/18 0400 10/16/18 0500 10/16/18 0600 10/16/18 0700  BP: 128/67 117/64 115/65 116/67  Pulse: (!) 109 (!) 59 (!) 59 (!) 54  Resp: 18 17 18    Temp: 99.4 F (37.4 C)     TempSrc: Axillary     SpO2: 97% 96% 96% 95%  Weight:      Height:       Temp (24hrs), Avg:99.6 F (37.6 C), Min:99.2 F (37.3 C), Max:99.9 F (37.7 C)  CBC Latest Ref Rng & Units 10/15/2018 10/14/2018 10/14/2018  WBC 4.0 - 10.5 K/uL 21.2(H) - 14.1(H)  Hemoglobin 13.0 - 17.0 g/dL 11.8(L) 11.2(L) 11.6(L)  Hematocrit 39.0 - 52.0 % 34.0(L) 33.0(L) 34.5(L)  Platelets 150 - 400 K/uL 196 - 170   BMP Latest Ref Rng & Units 10/15/2018 10/14/2018 10/14/2018  Glucose 70 - 99 mg/dL 140(H) - 157(H)  BUN 6 - 20 mg/dL 15 - 14  Creatinine 0.61 - 1.24 mg/dL 0.86 - 1.08  Sodium 135 - 145 mmol/L 137 140 138  Potassium 3.5 - 5.1 mmol/L 3.9 3.9 4.0  Chloride 98 - 111 mmol/L 103 - 106  CO2 22 - 32 mmol/L 23 - 20(L)  Calcium 8.9 - 10.3 mg/dL 9.1 - 8.9    Intake/Output Summary (Last 24 hours) at 10/16/2018 0813 Last data filed at 10/16/2018 0600 Gross per 24 hour  Intake 1610.58 ml  Output 1300 ml  Net 310.58 ml    Current Facility-Administered Medications:  .  0.9 %  sodium chloride infusion, , Intravenous, Continuous, Nuri Branca, Joyice Faster, MD, Last Rate: 75 mL/hr at 10/16/18 0600 .  0.9 %  sodium chloride infusion, , Intravenous, Continuous, Irianna Gilday, Joyice Faster, MD, Last Rate: 10 mL/hr at 10/13/18 1250 .  acetaminophen (TYLENOL) tablet 650 mg, 650 mg, Oral, Q4H PRN **OR** acetaminophen (TYLENOL) suppository 650 mg, 650 mg, Rectal, Q4H PRN, Judith Part, MD .  baclofen (LIORESAL) 10 mg/mL oral suspension 5 mg, 5 mg, Oral, TID PRN, Judith Part, MD, 5 mg at 10/15/18 2021 .  Chlorhexidine Gluconate Cloth 2 % PADS 6 each, 6 each, Topical, Daily, Judith Part, MD, 6 each at 10/15/18 0957 .  dexamethasone (DECADRON)  injection 4 mg, 4 mg, Intravenous, Q12H, Cecylia Brazill A, MD, 4 mg at 10/15/18 2210 .  docusate sodium (COLACE) capsule 100 mg, 100 mg, Oral, BID, Judith Part, MD, 100 mg at 10/15/18 2321 .  ondansetron (ZOFRAN) tablet 4 mg, 4 mg, Oral, Q4H PRN **OR** ondansetron (ZOFRAN) injection 4 mg, 4 mg, Intravenous, Q4H PRN, Judith Part, MD, 4 mg at 10/13/18 1153 .  pantoprazole (PROTONIX) injection 40 mg, 40 mg, Intravenous, Q24H, Eubanks, Katalina M, NP, 40 mg at 10/15/18 2321 .  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 Resection converted to emergent hematoma evacuation without navigation due to preop decompensation. MRI with good evacuation, ischemic sequelae of herniation  -exam continues to improve, as  expected -path pending -dex to 2bid -hiccoughs improved w/ baclofen without any significant sedation -PT/OT/SLP evals, rec rehab, PM&R consulted -SCDs/TEDs, start [place]  10/16/18 8:13 AM

## 2018-10-16 NOTE — H&P (Signed)
Physical Medicine and Rehabilitation Admission H&P    Chief Complaint  Patient presents with  . Headache  : HPI: Gregory Moreno is a 23 year old right-handed male with unremarkable past medical history.  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.  Independent prior to admission.  He also has his own pilots license and in really good shape- ran cross country.  Parents with good support.  Presented 10/13/2018 with intermittent headache right-sided weakness.  No reports of any recent trauma.  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  CT angiogram of head and neck with no aneurysm noted.  Underwent left craniotomy for hematoma evacuation and tumor resection 10/13/2018 per Dr. Venetia Constable. Per mother, no speech since surgery- was mumbling prior.  Placed on Decadron protocol.  Patient did have some leukocytosis 21,000 felt to be induced by steroid.  Pathology report pending.  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  Dysphasia #2 thin liquid diet.  Subcutaneous heparin added for DVT prophylaxis 10/16/2018.  Therapy evaluations completed patient was admitted for a comprehensive rehab program  Review of Systems  Unable to perform ROS: Acuity of condition  Pt unable to comply- aphasic  History reviewed. No pertinent past medical history. Past Surgical History:  Procedure Laterality Date  . CRANIOTOMY Left 10/13/2018   Procedure: LEFT CRANIOTOMY FOR TUMOR RESECTION;  Surgeon: Judith Part, MD;  Location: [place];  Service: Neurosurgery;  Laterality: Left;   History reviewed. No pertinent family history. Social History:  reports that he has never smoked. He has never used smokeless tobacco. No history on file for alcohol and drug. Allergies: No Known Allergies Medications Prior to Admission  Medication Sig Dispense Refill  . fluticasone  (FLONASE) 50 MCG/ACT nasal spray Place 1 spray into both nostrils as needed for allergies or rhinitis.    Marland Kitchen ibuprofen (ADVIL) 200 MG tablet Take 400 mg by mouth every 6 (six) hours as needed for headache or moderate pain.    Marland Kitchen loratadine (CLARITIN) 10 MG tablet Take 10 mg by mouth daily as needed for allergies.      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) has apartment there, has his pilot's license  Functional Status:  Mobility: [place] bed mobility: Needs Assistance Bed Mobility: Supine to Sit Supine to sit: +2 for physical assistance, +2 for safety/equipment, Max assist Sit to supine: Max assist, +2 for physical assistance, +2 for safety/equipment General bed mobility comments: Pt able to initiate LLE movement, maxA + 2 to progress to edge of bed with assist for trunk and RLE Transfers Overall transfer level: Needs assistance Equipment used: 2 person hand held assist Transfers: Squat Pivot Transfers, Sit to/from Stand Sit to Stand: +2 safety/equipment, Max assist Squat pivot transfers: Max assist, +2 physical assistance General transfer comment: Pt requiring maxA to stand x2 using left hand to pull up from handle on back of recliner. Decreased initiation,  however, able to activate hip extension for power up and no right knee buckle noted in standing. MaxA + 2 for  squat pivot transfer towards left  Ambulation/Gait General Gait Details: unable    ADL: ADL Overall ADL's : Needs assistance/impaired Eating/Feeding: NPO Grooming: Maximal assistance, Bed level, Sitting Lower Body Bathing: Maximal assistance, Total assistance, Bed level Lower Body Bathing Details (indicate cue type and reason): pt incontinent of urine while standing requiring assist to wash LEs Upper Body Dressing : Maximal assistance, Total assistance, Bed level Upper Body Dressing Details (indicate cue type and reason): donning new gown Lower Body Dressing: Total assistance Lower Body Dressing Details (indicate cue type and reason): doffing/donning new socks Functional mobility during ADLs: Moderate assistance, Maximal assistance, +2 for physical assistance, +2 for safety/equipment General ADL Comments: pt with R side weakness, impaired cognition, decreased sitting/standing balance  Cognition: Cognition Overall Cognitive Status: Impaired/Different from baseline Arousal/Alertness: Lethargic Orientation Level: Oriented to person Attention: Focused Focused Attention: Impaired Focused Attention Impairment: Functional basic Memory: (to assess further) Awareness: (will assess further) Problem Solving: (TBA further) Safety/Judgment: Impaired Cognition Arousal/Alertness: Awake/alert Behavior During Therapy: Flat affect Overall Cognitive Status: Impaired/Different from baseline Area of Impairment: Attention, Memory, Following commands, Safety/judgement, Problem solving Current Attention Level: Sustained Memory: Decreased recall of precautions, Decreased short-term memory Following Commands: Follows one step commands inconsistently, Follows one step commands with increased time Safety/Judgement: Decreased awareness of safety, Decreased awareness of deficits Problem Solving: Slow processing, Decreased initiation, Difficulty sequencing, Requires verbal cues, Requires tactile cues  General Comments: pt requires multimodal cues for command following due to decreased attention; visually tracking therapist.  no attempts at verbalization today  Physical Exam: Blood pressure 106/74, pulse (!) 54, temperature 98.7 F (37.1 C), temperature source Oral, resp. rate 15, height 5\' 8"  (1.727 m), weight 71.8 kg, SpO2 99 %. Physical Exam  Constitutional:  Awake, questionable how alert, staring with L eye closed- won't open on L; didn't track me around the room with R eye; mother at bedside; sitting up in chair at bedside, NAD  HENT:  Craniotomy site clean and dry- no significant erythema.  Couldn't get pt to comply with Cranial nerve exam- was chewing intermittently- had no food in mouth supposedly but has been pocketing on R;  Aphasic- no words No drooling  Eyes:  Pupil somewhat enlarged on R- eyelid at half mast most of time- L eye completely closed- couldn't open on L. No conjunctival erythema; no eye drainage  Neck: Neck supple. No JVD present. No tracheal deviation present.  Head slightly extended at cervical spine since in reclining bedside chair  Cardiovascular: Regular rhythm.  RRR, no M, R.G  Respiratory:  CTA B/L- no W/R/R  GI: Soft. Bowel sounds are normal.  Slightly distended for size- doesn't appear to be protuberant; NT; hypoactive BS  Genitourinary:    Genitourinary Comments: External condom catheter in place- light amber urine in bag   Musculoskeletal:     Comments: Some spontaneous movement in LUE_ grabbed tv controller on his own; flexed R elbow  15-20 degrees- no movement distal to R elbow Wiggled toes to command on LLE- no movement seen on RLE  Lymphadenopathy:    He has no cervical adenopathy.  Neurological:  Patient is alert.  He does follow some basic verbal commands.  He would point to certain numbers on his communication board.  He does display some decrease attention, slightly lethargic MAS of 3 in R elbow and MAS of 2-3 in R shoulder 3 beats of  clonus on RLE; cannot elicit Hoffman's on RUE or LUE.   Skin: Skin is warm and dry. No rash noted. No erythema.  Psychiatric:  Lethargic- didn't make eye contact /track around room Decreased attention- poor initiation Flat affect    Results for orders placed or performed during the hospital encounter of 10/13/18 (from the past 48 hour(s))  Basic metabolic panel     Status: Abnormal   Collection Time: 10/15/18  7:21 AM  Result Value Ref Range   Sodium 137 135 - 145 mmol/L   Potassium 3.9 3.5 - 5.1 mmol/L   Chloride 103 98 - 111 mmol/L   CO2 23 22 - 32 mmol/L   Glucose, Bld 140 (H) 70 - 99 mg/dL   BUN 15 6 - 20 mg/dL   Creatinine, Ser 0.86 0.61 - 1.24 mg/dL   Calcium 9.1 8.9 - 10.3 mg/dL   GFR calc non Af Amer >60 >60 mL/min   GFR calc Af Amer >60 >60 mL/min   Anion gap 11 5 - 15    Comment: Performed at [facility], [place] [address] [place], [place] [address]  Magnesium     Status: None   Collection Time: 10/15/18  7:21 AM  Result Value Ref Range   Magnesium 2.3 1.7 - 2.4 mg/dL    Comment: Performed at [place] [address] [place], [place] [address]  CBC     Status: Abnormal   Collection Time: 10/15/18  7:21 AM  Result Value Ref Range   WBC 21.2 (H) 4.0 - 10.5 K/uL   RBC 3.93 (L) 4.22 - 5.81 MIL/uL   Hemoglobin 11.8 (L) 13.0 - 17.0 g/dL   HCT 34.0 (L) 39.0 - 52.0 %   MCV 86.5 80.0 - 100.0 fL   MCH 30.0 26.0 - 34.0 pg   MCHC 34.7 30.0 - 36.0 g/dL   RDW 12.7 11.5 - 15.5 %   Platelets 196 150 - 400 K/uL   nRBC 0.0 0.0 - 0.2 %    Comment: Performed at [facility], [place] [address], [place], [place] [address]   Dg Chest Port 1 View  Result Date: 10/15/2018 CLINICAL DATA:  Status post craniotomy EXAM: PORTABLE CHEST 1 VIEW COMPARISON:  10/13/2018 FINDINGS: The heart size and mediastinal contours are within normal limits. Both lungs are clear. The visualized skeletal structures are unremarkable. IMPRESSION: No acute abnormality of the lungs in AP  portable projection. Electronically Signed   By: Eddie Candle M.D.   On: 10/15/2018 08:15       Medical Problem List and Plan: 1.  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  Surgery was done emergently due to signs of herniation due to notes. Status post left craniotomy for hematoma evacuation and tumor resection 10/13/2018.  Pathology report pending -no estim allowed until path report is back.  2.  Antithrombotics: -DVT/anticoagulation: Subcutaneous heparin initiated 10/16/2018  -antiplatelet therapy: N/A 3. Pain Management: Tylenol as needed 4. Mood: Provide emotional support  -antipsychotic agents: N/A 5. Neuropsych: This patient is not capable of making decisions on his own behalf. 6. Skin/Wound Care: Routine skin checks 7. Fluids/Electrolytes/Nutrition: Routine in and outs with follow-up chemistries 8.  Dysphagia.  Dysphasia #2 thin liquids.  Follow-up speech therapy -monitor for pocketing on R side 9 Aphasia in setting of poor initiation and poor attention - suggest primary team think about using Amantadine (+/-) Ritalin for attention/initiation/apahsia. 10. Constipation- per mother, doesn't think he's gone since last Friday-  don't see anything documented since then- will order something to clean him out- d/w'd PA. 11. Spasticity- already on Baclofen for hiccups- might benefit from Botox of RUE if doesn't improve in next few weeks. 12. Hiccups- on Baclofen- con't as required. Is working        Cathlyn Parsons, PA-C 10/17/2018

## 2018-10-17 MED ORDER — BACLOFEN 1 MG/ML ORAL SUSPENSION: 5.0000 mg | Freq: Three times a day (TID) | ORAL | Status: DC | PRN

## 2018-10-17 NOTE — Progress Notes (Signed)
  Speech Language Pathology Treatment: Dysphagia;Cognitive-Linquistic  Patient Details Name: Gregory Moreno MRN: [id number] DOB: [birth year] Today's Date: 10/17/2018 Time: [id number] SLP Time Calculation (min) (ACUTE ONLY): 20 min  Assessment / Plan / Recommendation Clinical Impression  [name] was alert making eye contact with therapist without cues 50% of the time. Focuses attention to therapist for 4-5 seconds before diverting to the right - left eye remains mostly closed. He followed only 1-2 commands today with longer delays and responded with head nod (yes x 1). Various strategies attempted to enhance receptive language including written commands, written questions, visual cues for gestures (wh/ he responds best to at present). Elicitation efforts for vocalization via imitation, phonation in unison for familiar speech did not result in mouthing words but he appeared to slightly move mandible and perhaps tongue.   He did exhibit s/s aspiration today that have not been observed during prior sessions this week. Immediate cough with first trial straw sips with thin and subtle delayed cough. Mild cough following solid texture. SLP continued with controlled smaller sips throughout session which mitigated s/s.He did masticate and maneuver graham cracker bolus to swallow without residual or apparent dysfunction. Given his s/s aspiration with thin and slower processing today, will not upgrade and continue Dys 2 texture, thin liquids and full supervision. He is scheduled to have CIR admission soon (today?).    HPI HPI: Gregory Moreno) is a 23 yo M who presented to ED on 9/28 with acute onset headache, nausea and vomiting, progressive R weakness, facial droop, and severe aphasia. CT Head/MRI showed hematoma in L temporal lobe with encapsulation of a high density rim suspicious for a underlying mass. Craniotomy for tumor resection and hemorrhage evacuation performed 9/28 1428. Intubated for procedure and  extubated 9/29 at 1139. Diet currently clear liquid.       SLP Plan  Continue with current plan of care       Recommendations  Diet recommendations: Dysphagia 2 (fine chop);Thin liquid Liquids provided via: Straw;Cup Medication Administration: Crushed with puree Supervision: Patient able to self feed;Full supervision/cueing for compensatory strategies;Staff to assist with self feeding Compensations: Minimize environmental distractions;Slow rate;Small sips/bites;Lingual sweep for clearance of pocketing Postural Changes and/or Swallow Maneuvers: Seated upright 90 degrees                General recommendations: Rehab consult Oral Care Recommendations: Oral care BID Follow up Recommendations: Inpatient Rehab SLP Visit Diagnosis: Dysphagia, unspecified (R13.10) Attention and concentration deficit following: Other cerebrovascular disease(brain tumor) Plan: Continue with current plan of care                       Houston Siren 10/17/2018, 10:14 AM  Orbie Pyo Colvin Caroli.Ed Risk analyst [phone] Office [phone]

## 2018-10-17 NOTE — Progress Notes (Signed)
Just performed a P2P w/ Jay, who requested some more detail in the chart regarding the patient's potential for rehabilitation and his neurologic stability.   From a stability perspective, his neurologic exam has only been improving post-operatively without any declines or any evidence of systemic disease that would contribute to a decline. So I do not have any concern regarding his stability, especially given that his neurologic exam is improving each day.   From a rehab perspective, I think he is essentially the poster child of why we have inpatient / intensive rehabilitation. From my daily examinations in the past 4 days after surgery, he has gone from localizing on the left and not following commands to wide awake, standing at bedside with PT, and following simple commands. For some commands, he requires multiple attempts (without cues) before he can follow them effectively, but I think that it is enough that he can be an active participant in intensive therapy sessions. He has already started to vocalize single words, which means that the language circuitry is still functional, just injured, not completely destroyed. He is still limited with complex multi-step commands at this time, but he is rapidly progressing and is 23 years old, so I would expect him to thankfully continue to improve rapidly. His hemiparesis is severe on the right, but he has some normal tone on the right so I am hopeful that this will start improving as he starts to engage that side. Since he does not have any cortical issues in the left hemisphere, I would NOT expect him to have a neglect syndrome that would limit his attention to the right side or limit his rehab.   I am happy to provide further details or answer any questions.  Judith Part, MD

## 2018-10-17 NOTE — Progress Notes (Signed)
Inpatient Rehabilitation Admissions Coordinator  I have received an initial denial for inpt rehab admission. [employer] MD feels pt can remain on acute and is not neurologically ready for CIR admit. Dr. Joaquim Nam has completed a peer to peer with [facility] MD. I have provided them with additional information and have  begun an expedited appeal for admit Monday. I met with pt's Mom at bedside and she is aware that Insurance MD at [facility] feels pt not medically ready for CIR admit. I will follow up on Monday.  Danne Baxter, RN, MSN Rehab Admissions Coordinator [phone] 10/17/2018 5:27 PM

## 2018-10-17 NOTE — Progress Notes (Signed)
Neurosurgery Service Progress Note  Subjective: No acute events overnight   Objective: Vitals:   10/17/18 0400 10/17/18 0500 10/17/18 0600 10/17/18 0700  BP: 106/74 126/81 105/62 109/73  Pulse: (!) 54 60 (!) 51 (!) 54  Resp: 15 (!) 22 14 15   Temp: 99 F (37.2 C)     TempSrc: Oral     SpO2: 99% 100% 100% 100%  Weight:      Height:       Temp (24hrs), Avg:99 F (37.2 C), Min:98.5 F (36.9 C), Max:99.7 F (37.6 C)  CBC Latest Ref Rng & Units 10/15/2018 10/14/2018 10/14/2018  WBC 4.0 - 10.5 K/uL 21.2(H) - 14.1(H)  Hemoglobin 13.0 - 17.0 g/dL 11.8(L) 11.2(L) 11.6(L)  Hematocrit 39.0 - 52.0 % 34.0(L) 33.0(L) 34.5(L)  Platelets 150 - 400 K/uL 196 - 170   BMP Latest Ref Rng & Units 10/15/2018 10/14/2018 10/14/2018  Glucose 70 - 99 mg/dL 140(H) - 157(H)  BUN 6 - 20 mg/dL 15 - 14  Creatinine 0.61 - 1.24 mg/dL 0.86 - 1.08  Sodium 135 - 145 mmol/L 137 140 138  Potassium 3.5 - 5.1 mmol/L 3.9 3.9 4.0  Chloride 98 - 111 mmol/L 103 - 106  CO2 22 - 32 mmol/L 23 - 20(L)  Calcium 8.9 - 10.3 mg/dL 9.1 - 8.9    Intake/Output Summary (Last 24 hours) at 10/17/2018 0820 Last data filed at 10/17/2018 0600 Gross per 24 hour  Intake 1774.26 ml  Output 2325 ml  Net -550.74 ml    Current Facility-Administered Medications:  .  0.9 %  sodium chloride infusion, , Intravenous, Continuous, Terika Pillard, Joyice Faster, MD, Last Rate: 75 mL/hr at 10/17/18 0600 .  0.9 %  sodium chloride infusion, , Intravenous, Continuous, Eldridge Marcott, Joyice Faster, MD, Last Rate: 10 mL/hr at 10/13/18 1250 .  acetaminophen (TYLENOL) tablet 650 mg, 650 mg, Oral, Q4H PRN **OR** acetaminophen (TYLENOL) suppository 650 mg, 650 mg, Rectal, Q4H PRN, Judith Part, MD .  baclofen (LIORESAL) 10 mg/mL oral suspension 5 mg, 5 mg, Oral, TID PRN, Judith Part, MD, 5 mg at 10/15/18 2021 .  Chlorhexidine Gluconate Cloth 2 % PADS 6 each, 6 each, Topical, Daily, Judith Part, MD, 6 each at 10/16/18 1506 .  dexamethasone (DECADRON)  injection 4 mg, 4 mg, Intravenous, Q12H, Maddalyn Lutze, Joyice Faster, MD, 4 mg at 10/16/18 2231 .  docusate sodium (COLACE) capsule 100 mg, 100 mg, Oral, BID, Miquela Costabile A, MD, 100 mg at 10/16/18 2230 .  heparin injection 5,000 Units, 5,000 Units, Subcutaneous, Q8H, Judith Part, MD, 5,000 Units at 10/17/18 0531 .  ondansetron (ZOFRAN) tablet 4 mg, 4 mg, Oral, Q4H PRN **OR** ondansetron (ZOFRAN) injection 4 mg, 4 mg, Intravenous, Q4H PRN, Judith Part, MD, 4 mg at 10/13/18 1153 .  pantoprazole (PROTONIX) injection 40 mg, 40 mg, Intravenous, Q24H, Eubanks, Katalina M, NP, 40 mg at 10/16/18 2338 .  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 Resection converted to emergent hematoma evacuation without navigation due to preop decompensation.  MRI with good evacuation, ischemic sequelae of herniation  -path pending -d/c dex -hiccoughs improved w/ baclofen without any significant sedation, continue baclofen -dysphagia fine / thin liquid diet -PT/OT/SLP evals, rec rehab, PM&R consulted -SCDs/TEDs, SQH -discharge to CIR today  Judith Part  10/17/18 8:20 AM

## 2018-10-17 NOTE — Discharge Instructions (Addendum)
Discharge Instructions  No restriction in activities, slowly increase your activity back to normal.   Your incision is closed with absorbable sutures, which will fall out naturally over the next few weeks. No need for a dressing on the wound, keep it open to air.  Okay to shower on the day of discharge. Be gentle when cleaning your incision. Use regular soap and water. If that is uncomfortable, try using baby shampoo. Do not submerge the wound under water for 2 weeks after surgery.  Follow up with Dr. Zada Finders in 2 weeks after discharge. If you do not already have a discharge appointment, please call his office at [phone] to schedule a follow up appointment. If you have any concerns or questions, please call the office and let us know.

## 2018-10-18 NOTE — Progress Notes (Signed)
Patient ID: Gregory Moreno, male   DOB: 1995-12-10, 23 y.o.   MRN: [id number] BP 132/84   Pulse (!) 110   Temp 99.2 F (37.3 C) (Oral)   Resp (!) 22   Ht 5\' 8"  (1.727 m)   Wt 71.8 kg   SpO2 98%   BMI 24.07 kg/m  Alert, plegic on right side No neuro changes Wound is clean, dry, no signs of infection

## 2018-10-19 MED ORDER — DOCUSATE SODIUM 50 MG/5ML PO LIQD
100.0000 mg | Freq: Two times a day (BID) | ORAL | Status: DC
  Administered 2018-10-19 – 2018-10-21 (×4): 100 mg via ORAL
  Filled 2018-10-19 (×6): qty 10

## 2018-10-19 NOTE — Progress Notes (Signed)
Patient ID: Gregory Moreno, male   DOB: 07/18/1995, 23 y.o.   MRN: [id number] BP 110/73 (BP Location: Left Arm)   Pulse 78   Temp 99.3 F (37.4 C) (Oral)   Resp (!) 2   Ht 5\' 8"  (1.727 m)   Wt 71.8 kg   SpO2 98%   BMI 24.07 kg/m  Alert, not following commands this morning. Moving left side purposefully. Diaphoretic. Pupils equal round and reactive Stable. Wound is clean, dry, no signs of infection.

## 2018-10-20 NOTE — Plan of Care (Addendum)
  Problem: Self-Care: Goal: Ability to communicate needs accurately will improve 10/20/2018 1851 by Ewing Schlein, RN Outcome: Progressing Note: Impaired communication related to patient condition as evidenced by inability to change facial expression, RN attempted to communicate with patient but did not get any response back (verbal or phsycial) besides minimal eye contact. RN and family will continue to interact with patient.

## 2018-10-20 NOTE — Progress Notes (Signed)
Inpatient Rehabilitation Admissions Coordinator  I await insurance approval to admit pt to inpt rehab.  Danne Baxter, RN, MSN Rehab Admissions Coordinator [phone] 10/20/2018 1:12 PM
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
## 2018-10-20 NOTE — Progress Notes (Signed)
Physical Therapy Treatment Patient Details Name: Gregory Moreno MRN: [id number] DOB: 1995-12-25 Today's Date: 10/20/2018    History of Present Illness 23 year old male presents to ED on 9/28 with left sided headache. On arrival to ED appeared confused, noted right sided weakness. Code Stroke Activated. CT Head/MRI with hematoma in left temporal lobe with encapsulation of a high density rim suspicious for a underlying mass. Neurosurgery consulted. Taken to OR for craniotomy for tumor resection and hemorrhage evacuation    PT Comments    Patient lethargic this pm and difficulty more than last session per mother with head control, but she thinks is due to staying in bed all weekend.  He was able to lift x 2 on his own but not to neutral positioning.  He can support weight on L LE and kept his hips extended up to about 10 seconds.  Still needing max A for mobility and is not making attempts to communicate yet.  Feel confident he will progress and will need CIR level rehab at d/c.  PT to follow acutely.    Follow Up Recommendations  CIR     Equipment Recommendations  Other (comment)(To be determined at next level of care)    Recommendations for Other Services       Precautions / Restrictions Precautions Precautions: Fall Precaution Comments: L crani    Mobility  Bed Mobility Overal bed mobility: Needs Assistance Bed Mobility: Supine to Sit     Supine to sit: Max assist     General bed mobility comments: assist to roll and bring L hand to rail, then to bring legs off bed and lift trunk upright using rail  Transfers Overall transfer level: Needs assistance Equipment used: 2 person hand held assist Transfers: Sit to/from [employer] Sit to Stand: Max assist;+2 physical assistance;From elevated surface   Squat pivot transfers: Max assist;+2 physical assistance     General transfer comment: assist for lifting and for L hip extension, R knee blocking; unable to  lift head and upper trunk while standing, then to chair via squat pivot landing on edge of chair needing assist to scoot back and position for support  Ambulation/Gait             General Gait Details: unable   Stairs             Wheelchair Mobility    Modified Rankin (Stroke Patients Only) Modified Rankin (Stroke Patients Only) Pre-Morbid Rankin Score: No symptoms Modified Rankin: Severe disability     Balance Overall balance assessment: Needs assistance Sitting-balance support: Feet supported Sitting balance-Leahy Scale: Zero Sitting balance - Comments: leans posterior and limited head and neck control in sitting; able to lift partly voluntarily but not consistently to command; assist to cross leg for initiating donning sock in sitting, but supported throughout   Standing balance support: Bilateral upper extremity supported;During functional activity Standing balance-Leahy Scale: Zero Standing balance comment: +2 assist for standing                            Cognition Arousal/Alertness: Awake/alert Behavior During Therapy: Flat affect Overall Cognitive Status: Impaired/Different from baseline Area of Impairment: Attention;Memory;Following commands;Safety/judgement;Problem solving                   Current Attention Level: Focused   Following Commands: Follows one step commands inconsistently;Follows one step commands with increased time(follows commands with assist) Safety/Judgement: Decreased awareness of safety;Decreased awareness of  deficits   Problem Solving: Slow processing;Decreased initiation;Difficulty sequencing;Requires verbal cues;Requires tactile cues General Comments: needs assist to follow commands as very delayed processing and not making attempts to verbally communicate      Exercises      General Comments General comments (skin integrity, edema, etc.): mother present throughout session      Pertinent Vitals/Pain  Pain Assessment: Faces Faces Pain Scale: No hurt    Home Living                      Prior Function            PT Goals (current goals can now be found in the care plan section) Progress towards PT goals: Progressing toward goals    Frequency    Min 4X/week      PT Plan Current plan remains appropriate    Co-evaluation              AM-PAC PT "6 Clicks" Mobility   Outcome Measure  Help needed turning from your back to your side while in a flat bed without using bedrails?: A Lot Help needed moving from lying on your back to sitting on the side of a flat bed without using bedrails?: Total Help needed moving to and from a bed to a chair (including a wheelchair)?: Total Help needed standing up from a chair using your arms (e.g., wheelchair or bedside chair)?: Total Help needed to walk in hospital room?: Total Help needed climbing 3-5 steps with a railing? : Total 6 Click Score: 7    End of Session Equipment Utilized During Treatment: Gait belt Activity Tolerance: Patient limited by fatigue Patient left: in chair;with call bell/phone within reach;with family/visitor present Nurse Communication: Mobility status PT Visit Diagnosis: Other abnormalities of gait and mobility (R26.89);Other symptoms and signs involving the nervous system (R29.898);Hemiplegia and hemiparesis Hemiplegia - Right/Left: Right Hemiplegia - dominant/non-dominant: Dominant Hemiplegia - caused by: Other Nontraumatic intracranial hemorrhage     Time: 1310-1333 PT Time Calculation (min) (ACUTE ONLY): 23 min  Charges:  $Therapeutic Activity: 8-22 mins $Neuromuscular Re-education: 8-22 mins                     Magda Kiel, [place] [facility] Services [phone] 10/20/2018    Gregory Moreno 10/20/2018, 2:18 PM

## 2018-10-20 NOTE — Progress Notes (Signed)
Neurosurgery Service Progress Note  Subjective: No acute events overnight   Objective: Vitals:   10/19/18 2000 10/19/18 2017 10/20/18 0014 10/20/18 0400  BP:  103/69 126/84 117/84  Pulse: 70 64 79 65  Resp: 19 18 (!) 22 18  Temp:  99.1 F (37.3 C) 98.9 F (37.2 C) 98.9 F (37.2 C)  TempSrc:  Oral Oral Oral  SpO2: 99% 99% 98% 98%  Weight:      Height:       Temp (24hrs), Avg:99.2 F (37.3 C), Min:98.9 F (37.2 C), Max:99.6 F (37.6 C)  CBC Latest Ref Rng & Units 10/15/2018 10/14/2018 10/14/2018  WBC 4.0 - 10.5 K/uL 21.2(H) - 14.1(H)  Hemoglobin 13.0 - 17.0 g/dL 11.8(L) 11.2(L) 11.6(L)  Hematocrit 39.0 - 52.0 % 34.0(L) 33.0(L) 34.5(L)  Platelets 150 - 400 K/uL 196 - 170   BMP Latest Ref Rng & Units 10/15/2018 10/14/2018 10/14/2018  Glucose 70 - 99 mg/dL 140(H) - 157(H)  BUN 6 - 20 mg/dL 15 - 14  Creatinine 0.61 - 1.24 mg/dL 0.86 - 1.08  Sodium 135 - 145 mmol/L 137 140 138  Potassium 3.5 - 5.1 mmol/L 3.9 3.9 4.0  Chloride 98 - 111 mmol/L 103 - 106  CO2 22 - 32 mmol/L 23 - 20(L)  Calcium 8.9 - 10.3 mg/dL 9.1 - 8.9    Intake/Output Summary (Last 24 hours) at 10/20/2018 0709 Last data filed at 10/20/2018 0458 Gross per 24 hour  Intake 360 ml  Output 1225 ml  Net -865 ml    Current Facility-Administered Medications:  .  0.9 %  sodium chloride infusion, , Intravenous, Continuous, Ostergard, Joyice Faster, MD, Stopped at 10/17/18 1007 .  0.9 %  sodium chloride infusion, , Intravenous, Continuous, Ostergard, Joyice Faster, MD, Last Rate: 10 mL/hr at 10/13/18 1250 .  acetaminophen (TYLENOL) tablet 650 mg, 650 mg, Oral, Q4H PRN **OR** acetaminophen (TYLENOL) suppository 650 mg, 650 mg, Rectal, Q4H PRN, Judith Part, MD .  baclofen (LIORESAL) 10 mg/mL oral suspension 5 mg, 5 mg, Oral, TID PRN, Judith Part, MD, 5 mg at 10/15/18 2021 .  Chlorhexidine Gluconate Cloth 2 % PADS 6 each, 6 each, Topical, Daily, Judith Part, MD, 6 each at 10/18/18 1000 .  docusate (COLACE) 50  MG/5ML liquid 100 mg, 100 mg, Oral, BID, Ostergard, Joyice Faster, MD, 100 mg at 10/19/18 2200 .  heparin injection 5,000 Units, 5,000 Units, Subcutaneous, Q8H, Judith Part, MD, 5,000 Units at 10/20/18 [phone] .  ondansetron (ZOFRAN) tablet 4 mg, 4 mg, Oral, Q4H PRN **OR** ondansetron (ZOFRAN) injection 4 mg, 4 mg, Intravenous, Q4H PRN, Judith Part, MD, 4 mg at 10/13/18 1153 .  pantoprazole (PROTONIX) injection 40 mg, 40 mg, Intravenous, Q24H, Eubanks, Katalina M, NP, 40 mg at 10/20/18 0046 .  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 Resection converted to emergent hematoma evacuation without navigation due to preop decompensation. MRI with good evacuation, ischemic sequelae of herniation  -exam continues to improve, today with some  new movement in RUE to painful stimulus -path pending -off dex -hiccoughs improved w/ baclofen without any significant sedation -PT/OT/SLP evals, rec rehab, PM&R consulted, insurance approval pending -SCDs/TEDs/SQH  Judith Part  10/20/18 7:09 AM

## 2018-10-21 ENCOUNTER — Other Ambulatory Visit: Payer: Self-pay

## 2018-10-21 ENCOUNTER — Inpatient Hospital Stay (HOSPITAL_COMMUNITY)
Admission: RE | Admit: 2018-10-21 | Discharge: 2018-11-28 | DRG: 057 | Disposition: A | Discharge status: Home Health | Payer: 59 | Source: Intra-hospital | Attending: Physical Medicine & Rehabilitation | Admitting: Physical Medicine & Rehabilitation

## 2018-10-21 ENCOUNTER — Encounter (HOSPITAL_COMMUNITY): Payer: Self-pay

## 2018-10-21 DIAGNOSIS — R7401 Elevation of levels of liver transaminase levels: Secondary | ICD-10-CM

## 2018-10-21 DIAGNOSIS — R040 Epistaxis: Secondary | ICD-10-CM | POA: Diagnosis not present

## 2018-10-21 DIAGNOSIS — D496 Neoplasm of unspecified behavior of brain: Secondary | ICD-10-CM

## 2018-10-21 DIAGNOSIS — I611 Nontraumatic intracerebral hemorrhage in hemisphere, cortical: Secondary | ICD-10-CM

## 2018-10-21 DIAGNOSIS — D72829 Elevated white blood cell count, unspecified: Secondary | ICD-10-CM

## 2018-10-21 DIAGNOSIS — I69151 Hemiplegia and hemiparesis following nontraumatic intracerebral hemorrhage affecting right dominant side: Secondary | ICD-10-CM | POA: Diagnosis not present

## 2018-10-21 DIAGNOSIS — T380X5A Adverse effect of glucocorticoids and synthetic analogues, initial encounter: Secondary | ICD-10-CM | POA: Diagnosis present

## 2018-10-21 DIAGNOSIS — G936 Cerebral edema: Secondary | ICD-10-CM | POA: Diagnosis not present

## 2018-10-21 DIAGNOSIS — R739 Hyperglycemia, unspecified: Secondary | ICD-10-CM | POA: Diagnosis present

## 2018-10-21 DIAGNOSIS — R4701 Aphasia: Secondary | ICD-10-CM

## 2018-10-21 DIAGNOSIS — M62838 Other muscle spasm: Secondary | ICD-10-CM | POA: Diagnosis not present

## 2018-10-21 DIAGNOSIS — M7989 Other specified soft tissue disorders: Secondary | ICD-10-CM | POA: Diagnosis not present

## 2018-10-21 DIAGNOSIS — R066 Hiccough: Secondary | ICD-10-CM | POA: Diagnosis present

## 2018-10-21 DIAGNOSIS — I69154 Hemiplegia and hemiparesis following nontraumatic intracerebral hemorrhage affecting left non-dominant side: Secondary | ICD-10-CM | POA: Diagnosis not present

## 2018-10-21 DIAGNOSIS — K5900 Constipation, unspecified: Secondary | ICD-10-CM | POA: Diagnosis present

## 2018-10-21 DIAGNOSIS — G8111 Spastic hemiplegia affecting right dominant side: Secondary | ICD-10-CM | POA: Diagnosis present

## 2018-10-21 DIAGNOSIS — G9389 Other specified disorders of brain: Secondary | ICD-10-CM

## 2018-10-21 DIAGNOSIS — R4781 Slurred speech: Secondary | ICD-10-CM | POA: Diagnosis not present

## 2018-10-21 DIAGNOSIS — E871 Hypo-osmolality and hyponatremia: Secondary | ICD-10-CM | POA: Diagnosis present

## 2018-10-21 DIAGNOSIS — R1312 Dysphagia, oropharyngeal phase: Secondary | ICD-10-CM

## 2018-10-21 DIAGNOSIS — Z85841 Personal history of malignant neoplasm of brain: Secondary | ICD-10-CM | POA: Diagnosis not present

## 2018-10-21 DIAGNOSIS — R4702 Dysphasia: Secondary | ICD-10-CM | POA: Diagnosis present

## 2018-10-21 DIAGNOSIS — I619 Nontraumatic intracerebral hemorrhage, unspecified: Secondary | ICD-10-CM | POA: Diagnosis present

## 2018-10-21 DIAGNOSIS — C711 Malignant neoplasm of frontal lobe: Secondary | ICD-10-CM | POA: Diagnosis not present

## 2018-10-21 DIAGNOSIS — G8110 Spastic hemiplegia affecting unspecified side: Secondary | ICD-10-CM | POA: Diagnosis not present

## 2018-10-21 DIAGNOSIS — R1310 Dysphagia, unspecified: Secondary | ICD-10-CM | POA: Diagnosis present

## 2018-10-21 DIAGNOSIS — R531 Weakness: Secondary | ICD-10-CM | POA: Diagnosis present

## 2018-10-21 DIAGNOSIS — R519 Headache, unspecified: Secondary | ICD-10-CM | POA: Diagnosis not present

## 2018-10-21 DIAGNOSIS — G939 Disorder of brain, unspecified: Secondary | ICD-10-CM

## 2018-10-21 DIAGNOSIS — C712 Malignant neoplasm of temporal lobe: Secondary | ICD-10-CM

## 2018-10-21 DIAGNOSIS — Z515 Encounter for palliative care: Secondary | ICD-10-CM

## 2018-10-21 DIAGNOSIS — R202 Paresthesia of skin: Secondary | ICD-10-CM | POA: Diagnosis not present

## 2018-10-21 LAB — CBC
HCT: 40.3 % (ref 39.0–52.0)
Hemoglobin: 13.5 g/dL (ref 13.0–17.0)
MCH: 29.5 pg (ref 26.0–34.0)
MCHC: 33.5 g/dL (ref 30.0–36.0)
MCV: 88 fL (ref 80.0–100.0)
Platelets: 343 10*3/uL (ref 150–400)
RBC: 4.58 MIL/uL (ref 4.22–5.81)
RDW: 12.9 % (ref 11.5–15.5)
WBC: 17.2 10*3/uL — ABNORMAL HIGH (ref 4.0–10.5)
nRBC: 0 % (ref 0.0–0.2)

## 2018-10-21 LAB — CREATININE, SERUM
Creatinine, Ser: 1.13 mg/dL (ref 0.61–1.24)
GFR calc Af Amer: >60 mL/min (ref >60)
GFR calc non Af Amer: >60 mL/min (ref >60)

## 2018-10-21 MED ORDER — SENNOSIDES-DOCUSATE SODIUM 8.6-50 MG PO TABS
1.0000 | ORAL_TABLET | Freq: Two times a day (BID) | ORAL | Status: DC
  Administered 2018-10-21 – 2018-11-27 (×68): 1 via ORAL
  Filled 2018-10-21 (×72): qty 1

## 2018-10-21 MED ORDER — DOCUSATE SODIUM 50 MG/5ML PO LIQD: 100.0000 mg | Freq: Two times a day (BID) | ORAL | 0 refills | Status: DC

## 2018-10-21 MED ORDER — DEXAMETHASONE 4 MG PO TABS
4.0000 mg | ORAL_TABLET | Freq: Two times a day (BID) | ORAL | Status: DC
  Administered 2018-10-21 – 2018-10-24 (×6): 4 mg via ORAL
  Filled 2018-10-21 (×6): qty 1

## 2018-10-21 MED ORDER — ACETAMINOPHEN 325 MG PO TABS
650.0000 mg | ORAL_TABLET | ORAL | Status: DC | PRN
  Administered 2018-10-22 – 2018-11-23 (×20): 650 mg via ORAL
  Filled 2018-10-21 (×22): qty 2

## 2018-10-21 MED ORDER — SORBITOL 70 % SOLN
30.0000 mL | Freq: Every day | Status: DC | PRN
  Administered 2018-10-22 – 2018-11-07 (×3): 30 mL via ORAL
  Filled 2018-10-21 (×4): qty 30

## 2018-10-21 MED ORDER — SENNOSIDES-DOCUSATE SODIUM 8.6-50 MG PO TABS: 1.0000 | ORAL_TABLET | Freq: Two times a day (BID) | ORAL | Status: DC

## 2018-10-21 MED ORDER — ONDANSETRON HCL 4 MG PO TABS: 4.0000 mg | ORAL_TABLET | ORAL | Status: DC | PRN

## 2018-10-21 MED ORDER — ONDANSETRON HCL 4 MG/2ML IJ SOLN: 4.0000 mg | INTRAMUSCULAR | Status: DC | PRN

## 2018-10-21 MED ORDER — BISACODYL 10 MG RE SUPP
10.0000 mg | Freq: Every day | RECTAL | Status: DC | PRN
  Administered 2018-10-22 – 2018-11-06 (×2): 10 mg via RECTAL
  Filled 2018-10-21 (×2): qty 1

## 2018-10-21 MED ORDER — HEPARIN SODIUM (PORCINE) 5000 UNIT/ML IJ SOLN
5000.0000 [IU] | Freq: Three times a day (TID) | INTRAMUSCULAR | Status: DC
  Administered 2018-10-21 – 2018-11-28 (×101): 5000 [IU] via SUBCUTANEOUS
  Filled 2018-10-21 (×108): qty 1

## 2018-10-21 MED ORDER — PANTOPRAZOLE SODIUM 40 MG IV SOLR
40.0000 mg | INTRAVENOUS | Status: DC
  Administered 2018-10-21: 40 mg via INTRAVENOUS
  Filled 2018-10-21: qty 40

## 2018-10-21 MED ORDER — HEPARIN SODIUM (PORCINE) 5000 UNIT/ML IJ SOLN: 5000.0000 [IU] | Freq: Three times a day (TID) | INTRAMUSCULAR | Status: DC

## 2018-10-21 MED ORDER — ACETAMINOPHEN 650 MG RE SUPP: 650.0000 mg | RECTAL | Status: DC | PRN

## 2018-10-21 MED ORDER — BACLOFEN 1 MG/ML ORAL SUSPENSION
5.0000 mg | Freq: Three times a day (TID) | ORAL | Status: DC | PRN
  Administered 2018-11-18: 23:00:00 5 mg via ORAL
  Filled 2018-10-21 (×2): qty 0.5
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
## 2018-10-21 NOTE — Progress Notes (Signed)
Patient transferred to 4W-01. Report given to Sharyn Lull, his mother also at bedside.

## 2018-10-21 NOTE — Progress Notes (Signed)
Occupational Therapy Treatment Patient Details Name: Gregory Moreno MRN: [id number] DOB: [birth year] Today's Date: 10/21/2018    History of present illness 23 year old male presents to ED on 9/28 with left sided headache. On arrival to ED appeared confused, noted right sided weakness. Code Stroke Activated. CT Head/MRI with hematoma in left temporal lobe with encapsulation of a high density rim suspicious for a underlying mass. Neurosurgery consulted. Taken to OR for craniotomy for tumor resection and hemorrhage evacuation   OT comments  Pt able to static sit EOB ~ 4 minutes with MIN +2 for seated ADLs. Pt required MAX verbal/ tactile cues and hand over hand assist to initiate washing face but pt able to complete task with MOD A. Pt complete simulated toilet transfer MAX A +2. Pt noted to use LUE to reach for arm rest during stand pivot transfer to recliner. Pt likely to DC to CIR this afternoon, but will continue to follow acutely for OT needs.    Follow Up Recommendations  CIR;Supervision/Assistance - 24 hour    Equipment Recommendations  Other (comment)    Recommendations for Other Services      Precautions / Restrictions Precautions Precautions: Fall Precaution Comments: L crani       Mobility Bed Mobility Overal bed mobility: Needs Assistance Bed Mobility: Supine to Sit     Supine to sit: Max assist;+2 for physical assistance     General bed mobility comments: Assist to advance RLE to EOB; assist to elevate trunk into sitting  Transfers Overall transfer level: Needs assistance Equipment used: 2 person hand held assist Transfers: Sit to/from [employer] Sit to Stand: Max assist;+2 physical assistance;From elevated surface   Squat pivot transfers: Max assist;+2 physical assistance     General transfer comment: assist to power up from elevated surface; Assist to block R knee in standing; pt able to use LUE to hold on to back of recliner for sit>stand;  pt reaching for arm of chair with LUE this session during stand pivot transfer    Balance Overall balance assessment: Needs assistance Sitting-balance support: Feet supported Sitting balance-Leahy Scale: Zero Sitting balance - Comments: leans posterior and limited head and neck control in sitting; able to lift partly voluntarily but not consistently to command   Standing balance support: Bilateral upper extremity supported;During functional activity Standing balance-Leahy Scale: Zero Standing balance comment: +2 assist for standing                           ADL either performed or assessed with clinical judgement   ADL Overall ADL's : Needs assistance/impaired     Grooming: Wash/dry face;Sitting;Maximal assistance Grooming Details (indicate cue type and reason): pt required MAX cues and HOH assist to initate bringing wash cloth up to face; able to wash lips and mouth once wash cloth close to face                 Toilet Transfer: Maximal assistance;+2 for physical assistance;Stand-pivot;+2 for safety/equipment Toilet Transfer Details (indicate cue type and reason): MAX A +2 simulated toilet transfer from EOB>recliner; pt assisted more this date by reaching with LUE to arm of chair during transfer         Functional mobility during ADLs: Maximal assistance;+2 for physical assistance;+2 for safety/equipment General ADL Comments: pt with R side weakness, impaired cognition, decreased sitting/standing balance; able to wash face this session at EOB with MAX cues and HOH assist to initiate task  Vision Baseline Vision/History: No visual deficits Patient Visual Report: Other (comment)(L eye ptosis) Vision Assessment?: Vision impaired- to be further tested in functional context   Perception     Praxis      Cognition Arousal/Alertness: Awake/alert Behavior During Therapy: Flat affect Overall Cognitive Status: Impaired/Different from baseline Area of Impairment:  Attention;Memory;Following commands;Safety/judgement;Problem solving                   Current Attention Level: Focused Memory: Decreased recall of precautions;Decreased short-term memory Following Commands: Follows one step commands inconsistently;Follows one step commands with increased time Safety/Judgement: Decreased awareness of safety;Decreased awareness of deficits   Problem Solving: Slow processing;Decreased initiation;Difficulty sequencing;Requires verbal cues;Requires tactile cues General Comments: needs assist to initiate and follow commands; delayed processing overall, no attempts to communicate this session        Exercises     Shoulder Instructions       General Comments mother present throughout session    Pertinent Vitals/ Pain       Pain Assessment: No/denies pain  Home Living                                          Prior Functioning/Environment              Frequency  Min 3X/week        Progress Toward Goals  OT Goals(current goals can now be found in the care plan section)  Progress towards OT goals: Progressing toward goals  Acute Rehab OT Goals Patient Stated Goal: none stated OT Goal Formulation: With patient Time For Goal Achievement: 10/28/18 Potential to Achieve Goals: Good  Plan Discharge plan remains appropriate    Co-evaluation    PT/OT/SLP Co-Evaluation/Treatment: Yes Reason for Co-Treatment: Complexity of the patient's impairments (multi-system involvement);For patient/therapist safety;To address functional/ADL transfers   OT goals addressed during session: ADL's and self-care      AM-PAC OT "6 Clicks" Daily Activity     Outcome Measure   Help from another person eating meals?: Total Help from another person taking care of personal grooming?: Total Help from another person toileting, which includes using toliet, bedpan, or urinal?: Total Help from another person bathing (including washing,  rinsing, drying)?: Total Help from another person to put on and taking off regular upper body clothing?: Total Help from another person to put on and taking off regular lower body clothing?: Total 6 Click Score: 6    End of Session Equipment Utilized During Treatment: Gait belt  OT Visit Diagnosis: Other symptoms and signs involving cognitive function;Hemiplegia and hemiparesis;Unsteadiness on feet (R26.81) Hemiplegia - Right/Left: Right Hemiplegia - dominant/non-dominant: Dominant   Activity Tolerance Patient tolerated treatment well   Patient Left in chair;with call bell/phone within reach;with family/visitor present   Nurse Communication Mobility status        Time: [id number] OT Time Calculation (min): 31 min  Charges: OT General Charges $OT Visit: 1 Visit OT Treatments $Self Care/Home Management : 8-22 mins  [place], [place] [phone] [place] 10/21/2018, 1:29 PM

## 2018-10-21 NOTE — Progress Notes (Signed)
Neurosurgery Service Progress Note  Subjective: No acute events overnight   Objective: Vitals:   10/21/18 0946 10/21/18 0951 10/21/18 1000 10/21/18 1003  BP:      Pulse: (!) 113 (!) 105 (!) 108 95  Resp:      Temp:      TempSrc:      SpO2: 97% 97% 97% 97%  Weight:      Height:       Temp (24hrs), Avg:99.3 F (37.4 C), Min:98.5 F (36.9 C), Max:99.8 F (37.7 C)  CBC Latest Ref Rng & Units 10/15/2018 10/14/2018 10/14/2018  WBC 4.0 - 10.5 K/uL 21.2(H) - 14.1(H)  Hemoglobin 13.0 - 17.0 g/dL 11.8(L) 11.2(L) 11.6(L)  Hematocrit 39.0 - 52.0 % 34.0(L) 33.0(L) 34.5(L)  Platelets 150 - 400 K/uL 196 - 170   BMP Latest Ref Rng & Units 10/15/2018 10/14/2018 10/14/2018  Glucose 70 - 99 mg/dL 140(H) - 157(H)  BUN 6 - 20 mg/dL 15 - 14  Creatinine 0.61 - 1.24 mg/dL 0.86 - 1.08  Sodium 135 - 145 mmol/L 137 140 138  Potassium 3.5 - 5.1 mmol/L 3.9 3.9 4.0  Chloride 98 - 111 mmol/L 103 - 106  CO2 22 - 32 mmol/L 23 - 20(L)  Calcium 8.9 - 10.3 mg/dL 9.1 - 8.9    Intake/Output Summary (Last 24 hours) at 10/21/2018 1231 Last data filed at 10/21/2018 0900 Gross per 24 hour  Intake 962 ml  Output 1000 ml  Net -38 ml    Current Facility-Administered Medications:  .  0.9 %  sodium chloride infusion, , Intravenous, Continuous, Talise Sligh, Joyice Faster, MD, Stopped at 10/17/18 1007 .  0.9 %  sodium chloride infusion, , Intravenous, Continuous, Tremeka Helbling, Joyice Faster, MD, Last Rate: 10 mL/hr at 10/13/18 1250 .  acetaminophen (TYLENOL) tablet 650 mg, 650 mg, Oral, Q4H PRN, 650 mg at 10/20/18 2124 **OR** acetaminophen (TYLENOL) suppository 650 mg, 650 mg, Rectal, Q4H PRN, Judith Part, MD .  baclofen (LIORESAL) 10 mg/mL oral suspension 5 mg, 5 mg, Oral, TID PRN, Judith Part, MD, 5 mg at 10/15/18 2021 .  Chlorhexidine Gluconate Cloth 2 % PADS 6 each, 6 each, Topical, Daily, Judith Part, MD, 6 each at 10/21/18 [phone] .  docusate (COLACE) 50 MG/5ML liquid 100 mg, 100 mg, Oral, BID, Judith Part, MD, 100 mg at 10/21/18 0938 .  heparin injection 5,000 Units, 5,000 Units, Subcutaneous, Q8H, Judith Part, MD, 5,000 Units at 10/21/18 0529 .  ondansetron (ZOFRAN) tablet 4 mg, 4 mg, Oral, Q4H PRN **OR** ondansetron (ZOFRAN) injection 4 mg, 4 mg, Intravenous, Q4H PRN, Judith Part, MD, 4 mg at 10/13/18 1153 .  pantoprazole (PROTONIX) injection 40 mg, 40 mg, Intravenous, Q24H, Eubanks, Katalina M, NP, 40 mg at 10/21/18 0015 .  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 Resection converted to emergent hematoma evacuation without navigation due to preop decompensation. MRI with good evacuation, ischemic sequelae of herniation  -path pending -off dex -hiccoughs improved w/ baclofen without any significant sedation -PT/OT/SLP evals, rec  rehab, PM&R consulted -SCDs/TEDs/SQH -discharge to CIR today. Sutures are absorbable and do not need to be removed. Will discuss in tumor board once path is back   Judith Part  10/21/18 12:31 PM

## 2018-10-21 NOTE — Progress Notes (Signed)
Patient tachycardic this morning, HR sustaining between 105s-117s. Patient is asymptomatic. MD Ostergard is aware. RN to continue to monitor.

## 2018-10-21 NOTE — Progress Notes (Signed)
Inpatient Rehabilitation Admissions Coordinator  Insurance has approved an inpt rehab admit. I have contacted Dr. Joaquim Nam and he will make the arrangements to d/c to CIR today. I contacted pt's Mom by phone and she is in agreement. I will make th arrangements to admit today.  Danne Baxter, RN, MSN Rehab Admissions Coordinator [phone] 10/21/2018 9:57 AM

## 2018-10-21 NOTE — Progress Notes (Signed)
Pt arrived to unit via hospital bed with mom escorted by nursing staff. No personal belongings at beside. Pt nonverbal no indications of distress or pain. Mom educated on visitor policy, falls policy, and valuables policy. Pt bed is in  lowest position, call bell at left hand. Will continue to monitor. Gregory Moreno

## 2018-10-21 NOTE — H&P (Signed)
Physical Medicine and Rehabilitation Admission H&P       Chief Complaint  Patient presents with   Headache  : HPI: STEFON BENALLY is a 23 year old right-handed male with unremarkable past medical history.  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.  Independent prior to admission.  He also has his own pilots license and in really good shape- ran cross country.  Parents with good support.  Presented 10/13/2018 with intermittent headache right-sided weakness.  No reports of any recent trauma.  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  CT angiogram of head and neck with no aneurysm noted.  Underwent left craniotomy for hematoma evacuation and tumor resection 10/13/2018 per Dr. Venetia Constable. Per mother, no speech since surgery- was mumbling prior.  Placed on Decadron protocol.  Patient did have some leukocytosis 21,000 felt to be induced by steroid.  Pathology report pending.  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  Dysphasia #2 thin liquid diet.  Subcutaneous heparin added for DVT prophylaxis 10/16/2018.  Therapy evaluations completed patient was admitted for a comprehensive rehab program  Review of Systems  Unable to perform ROS: Acuity of condition  Pt unable to comply- aphasic  History reviewed. No pertinent past medical history.      Past Surgical History:  Procedure Laterality Date   CRANIOTOMY Left 10/13/2018   Procedure: LEFT CRANIOTOMY FOR TUMOR RESECTION;  Surgeon: Judith Part, MD;  Location: [place] [facility];  Service: Neurosurgery;  Laterality: Left;   History reviewed. No pertinent family history. Social History:  reports that he has never smoked. He has never used smokeless tobacco. No history on file for alcohol and drug. Allergies: No Known Allergies       Medications Prior to Admission  Medication Sig Dispense Refill    fluticasone (FLONASE) 50 MCG/ACT nasal spray Place 1 spray into both nostrils as needed for allergies or rhinitis.     ibuprofen (ADVIL) 200 MG tablet Take 400 mg by mouth every 6 (six) hours as needed for headache or moderate pain.     loratadine (CLARITIN) 10 MG tablet Take 10 mg by mouth daily as needed for allergies.      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) has apartment there, has his pilot's license  Functional Status:  Mobility: Bed Mobility Overal bed mobility: Needs Assistance Bed Mobility: Supine to Sit Supine to sit: +2 for physical assistance, +2 for safety/equipment, Max assist Sit to supine: Max assist, +2 for physical assistance, +2 for safety/equipment General bed mobility comments: Pt able to initiate LLE movement, maxA + 2 to progress to edge of bed with assist for trunk and RLE Transfers Overall transfer level: Needs assistance Equipment used: 2 person hand held assist Transfers: Squat Pivot Transfers, Sit to/from Stand Sit to Stand: +2 safety/equipment, Max assist Squat pivot transfers: Max assist, +2 physical assistance General transfer comment: Pt requiring maxA to stand x2  using left hand to pull up from handle on back of recliner. Decreased initiation, however, able to activate hip extension for power up and no right knee buckle noted  in standing. MaxA + 2 for squat pivot transfer towards left  Ambulation/Gait General Gait Details: unable    ADL: ADL Overall ADL's : Needs assistance/impaired Eating/Feeding: NPO Grooming: Maximal assistance, Bed level, Sitting Lower Body Bathing: Maximal assistance, Total assistance, Bed level Lower Body Bathing Details (indicate cue type and reason): pt incontinent of urine while standing requiring assist to wash LEs Upper Body Dressing : Maximal assistance, Total assistance, Bed level Upper Body Dressing Details (indicate cue type and reason): donning new gown Lower Body Dressing: Total assistance Lower Body Dressing Details (indicate cue type and reason): doffing/donning new socks Functional mobility during ADLs: Moderate assistance, Maximal assistance, +2 for physical assistance, +2 for safety/equipment General ADL Comments: pt with R side weakness, impaired cognition, decreased sitting/standing balance  Cognition: Cognition Overall Cognitive Status: Impaired/Different from baseline Arousal/Alertness: Lethargic Orientation Level: Oriented to person Attention: Focused Focused Attention: Impaired Focused Attention Impairment: Functional basic Memory: (to assess further) Awareness: (will assess further) Problem Solving: (TBA further) Safety/Judgment: Impaired Cognition Arousal/Alertness: Awake/alert Behavior During Therapy: Flat affect Overall Cognitive Status: Impaired/Different from baseline Area of Impairment: Attention, Memory, Following commands, Safety/judgement, Problem solving Current Attention Level: Sustained Memory: Decreased recall of precautions, Decreased short-term memory Following Commands: Follows one step commands inconsistently, Follows one step commands with increased time Safety/Judgement: Decreased awareness of safety, Decreased awareness of deficits Problem Solving: Slow processing, Decreased initiation, Difficulty sequencing, Requires verbal cues,  Requires tactile cues General Comments: pt requires multimodal cues for command following due to decreased attention; visually tracking therapist.  no attempts at verbalization today  Physical Exam: Blood pressure 106/74, pulse (!) 54, temperature 98.7 F (37.1 C), temperature source Oral, resp. rate 15, height 5\' 8"  (1.727 m), weight 71.8 kg, SpO2 99 %. Physical Exam  Constitutional:  Awake, questionable how alert, staring with L eye closed- won't open on L; didn't track me around the room with R eye; mother at bedside; sitting up in chair at bedside, NAD  HENT:  Craniotomy site clean and dry- no significant erythema.  Couldn't get pt to comply with Cranial nerve exam- was chewing intermittently- had no food in mouth supposedly but has been pocketing on R;  Aphasic- no words No drooling  Eyes:  Pupil somewhat enlarged on R- eyelid at half mast most of time- L eye completely closed- couldn't open on L. No conjunctival erythema; no eye drainage  Neck: Neck supple. No JVD present. No tracheal deviation present.  Head slightly extended at cervical spine since in reclining bedside chair  Cardiovascular: Regular rhythm.  RRR, no M, R.G  Respiratory:  CTA B/L- no W/R/R  GI: Soft. Bowel sounds are normal.  Slightly distended for size- doesn't appear to be protuberant; NT; hypoactive BS  Genitourinary:    Genitourinary Comments: External condom catheter in place- light amber urine in bag   Musculoskeletal:     Comments: Some spontaneous movement in LUE_ grabbed tv controller on his own; flexed R elbow  15-20 degrees- no movement distal to R elbow Wiggled toes to command on LLE- no movement seen on RLE  Lymphadenopathy:    He has no cervical adenopathy.  Neurological:  Patient is alert.  He does follow some basic verbal commands.  He would point to certain numbers on his communication board.  He does display some decrease attention, slightly lethargic MAS of  3 in R elbow and MAS of 2-3 in R  shoulder 3 beats of clonus on RLE; cannot elicit Hoffman's on RUE or LUE.   Skin: Skin is warm and dry. No rash noted. No erythema.  Psychiatric:  Lethargic- didn't make eye contact /track around room Decreased attention- poor initiation Flat affect    Lab Results Last 48 Hours        Results for orders placed or performed during the hospital encounter of 10/13/18 (from the past 48 hour(s))  Basic metabolic panel     Status: Abnormal   Collection Time: 10/15/18  7:21 AM  Result Value Ref Range   Sodium 137 135 - 145 mmol/L   Potassium 3.9 3.5 - 5.1 mmol/L   Chloride 103 98 - 111 mmol/L   CO2 23 22 - 32 mmol/L   Glucose, Bld 140 (H) 70 - 99 mg/dL   BUN 15 6 - 20 mg/dL   Creatinine, Ser 0.86 0.61 - 1.24 mg/dL   Calcium 9.1 8.9 - 10.3 mg/dL   GFR calc non Af Amer >60 >60 mL/min   GFR calc Af Amer >60 >60 mL/min   Anion gap 11 5 - 15    Comment: Performed at [facility], [place] [address], [place], [place] [address]  Magnesium     Status: None   Collection Time: 10/15/18  7:21 AM  Result Value Ref Range   Magnesium 2.3 1.7 - 2.4 mg/dL    Comment: Performed at [facility] [address] [place], [place] [address]  CBC     Status: Abnormal   Collection Time: 10/15/18  7:21 AM  Result Value Ref Range   WBC 21.2 (H) 4.0 - 10.5 K/uL   RBC 3.93 (L) 4.22 - 5.81 MIL/uL   Hemoglobin 11.8 (L) 13.0 - 17.0 g/dL   HCT 34.0 (L) 39.0 - 52.0 %   MCV 86.5 80.0 - 100.0 fL   MCH 30.0 26.0 - 34.0 pg   MCHC 34.7 30.0 - 36.0 g/dL   RDW 12.7 11.5 - 15.5 %   Platelets 196 150 - 400 K/uL   nRBC 0.0 0.0 - 0.2 %    Comment: Performed at [facility], [place] [address], [place], [place] [address]      Imaging Results (Last 48 hours)  Dg Chest Port 1 View  Result Date: 10/15/2018 CLINICAL DATA:  Status post craniotomy EXAM: PORTABLE CHEST 1 VIEW COMPARISON:  10/13/2018 FINDINGS: The heart size and mediastinal contours are within normal limits. Both  lungs are clear. The visualized skeletal structures are unremarkable. IMPRESSION: No acute abnormality of the lungs in AP portable projection. Electronically Signed   By: Eddie Candle M.D.   On: 10/15/2018 08:15        Medical Problem List and Plan: 1.  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  Surgery was done emergently due to signs of herniation due to notes. Status post left craniotomy for hematoma evacuation and tumor resection 10/13/2018.  Pathology report pending -no estim allowed until path report is back.  2.  Antithrombotics: -DVT/anticoagulation: Subcutaneous heparin initiated 10/16/2018             -antiplatelet therapy: N/A 3. Pain Management: Tylenol as needed 4. Mood: Provide emotional support             -antipsychotic agents: N/A 5. Neuropsych: This patient is not capable of making decisions on his own behalf. 6. Skin/Wound Care: Routine skin checks 7.  Fluids/Electrolytes/Nutrition: Routine in and outs with follow-up chemistries 8.  Dysphagia.  Dysphasia #2 thin liquids.  Follow-up speech therapy -monitor for pocketing on R side 9 Aphasia in setting of poor initiation and poor attention - suggest primary team think about using Amantadine (+/-) Ritalin for attention/initiation/apahsia. 10. Constipation- per mother, doesn't think he's gone since last Friday- don't see anything documented since then- will order something to clean him out- d/w'd PA. 11. Spasticity- already on Baclofen for hiccups- might benefit from Botox of RUE if doesn't improve in next few weeks. 12. Hiccups- on Baclofen- con't as required. Is working        Cathlyn Parsons, PA-C 10/17/2018

## 2018-10-21 NOTE — Progress Notes (Signed)
Physical Therapy Treatment Patient Details Name: Gregory Moreno MRN: [id number] DOB: 06/08/1995 Today's Date: 10/21/2018    History of Present Illness 23 year old male presents to ED on 9/28 with left sided headache. On arrival to ED appeared confused, noted right sided weakness. Code Stroke Activated. CT Head/MRI with hematoma in left temporal lobe with encapsulation of a high density rim suspicious for a underlying mass. Neurosurgery consulted. Taken to OR for craniotomy for tumor resection and hemorrhage evacuation    PT Comments    Patient progressing to increase standing time and able to perform lateral weight shifts for increased R LE weight bearing with A for hip/knee and trunk extension.  He continues to require interdisciplinary skilled inpatient therapies to progress due to decreased initiation, apraxia, decreased balance, R hemiparesis, decreased activity tolerance and will benefit from CIR level rehab prior to d/c home with family assist.     Follow Up Recommendations  CIR     Equipment Recommendations  Other (comment)(TBA)    Recommendations for Other Services       Precautions / Restrictions Precautions Precautions: Fall Precaution Comments: L crani    Mobility  Bed Mobility Overal bed mobility: Needs Assistance Bed Mobility: Supine to Sit     Supine to sit: Max assist;+2 for physical assistance Sit to supine: Max assist;+2 for physical assistance;+2 for safety/equipment   General bed mobility comments: Assist to advance RLE to EOB; assist to elevate trunk into sitting  Transfers Overall transfer level: Needs assistance Equipment used: 2 person hand held assist(handle on back or recliner for STS)   Sit to Stand: Max assist;+2 physical assistance;From elevated surface   Squat pivot transfers: Max assist;+2 physical assistance     General transfer comment: assist to power up from elevated surface; Assist to block R knee in standing; pt able to use LUE to  hold on to back of recliner for sit>stand; pt reaching for arm of chair with LUE this session during stand pivot transfer  Ambulation/Gait                 Stairs             Wheelchair Mobility    Modified Rankin (Stroke Patients Only) Modified Rankin (Stroke Patients Only) Pre-Morbid Rankin Score: No symptoms Modified Rankin: Severe disability     Balance Overall balance assessment: Needs assistance Sitting-balance support: Feet supported Sitting balance-Leahy Scale: Zero Sitting balance - Comments: leans posterior and limited head and neck control in sitting; able to lift partly voluntarily but not consistently to command   Standing balance support: Bilateral upper extremity supported;During functional activity Standing balance-Leahy Scale: Zero Standing balance comment: +2 assist for standing               High Level Balance Comments: Standing lateral weight shifts over about 15 seconds with R knee blocked and assist for trunk and hip extension            Cognition Arousal/Alertness: Awake/alert Behavior During Therapy: Flat affect Overall Cognitive Status: Impaired/Different from baseline Area of Impairment: Attention;Memory;Following commands;Safety/judgement;Problem solving                   Current Attention Level: Focused Memory: Decreased recall of precautions;Decreased short-term memory Following Commands: Follows one step commands inconsistently;Follows one step commands with increased time Safety/Judgement: Decreased awareness of safety;Decreased awareness of deficits   Problem Solving: Slow processing;Decreased initiation;Difficulty sequencing;Requires verbal cues;Requires tactile cues General Comments: needs assist to initiate and follow commands; delayed  processing overall, no attempts to communicate this session      Exercises      General Comments General comments (skin integrity, edema, etc.): mother present and  supportive      Pertinent Vitals/Pain Pain Score: 0-No pain    Home Living                      Prior Function            PT Goals (current goals can now be found in the care plan section) Progress towards PT goals: Progressing toward goals    Frequency           PT Plan Current plan remains appropriate    Co-evaluation PT/OT/SLP Co-Evaluation/Treatment: Yes Reason for Co-Treatment: Complexity of the patient's impairments (multi-system involvement);Necessary to address cognition/behavior during functional activity;To address functional/ADL transfers PT goals addressed during session: Mobility/safety with mobility;Balance        AM-PAC PT "6 Clicks" Mobility   Outcome Measure  Help needed turning from your back to your side while in a flat bed without using bedrails?: A Lot Help needed moving from lying on your back to sitting on the side of a flat bed without using bedrails?: Total Help needed moving to and from a bed to a chair (including a wheelchair)?: Total Help needed standing up from a chair using your arms (e.g., wheelchair or bedside chair)?: Total Help needed to walk in hospital room?: Total Help needed climbing 3-5 steps with a railing? : Total 6 Click Score: 7    End of Session Equipment Utilized During Treatment: Gait belt Activity Tolerance: Patient limited by fatigue Patient left: in chair;with call bell/phone within reach;with family/visitor present   PT Visit Diagnosis: Other abnormalities of gait and mobility (R26.89);Other symptoms and signs involving the nervous system (R29.898);Hemiplegia and hemiparesis Hemiplegia - Right/Left: Right Hemiplegia - dominant/non-dominant: Dominant Hemiplegia - caused by: Other Nontraumatic intracranial hemorrhage     Time: [id number] PT Time Calculation (min) (ACUTE ONLY): 31 min  Charges:  $Neuromuscular Re-education: 8-22 mins                     Magda Kiel, [place] [facility] [phone] 10/21/2018    Reginia Naas 10/21/2018, 6:08 PM

## 2018-10-22 ENCOUNTER — Inpatient Hospital Stay (HOSPITAL_COMMUNITY): Payer: 59 | Admitting: Speech Pathology

## 2018-10-22 ENCOUNTER — Inpatient Hospital Stay (HOSPITAL_COMMUNITY): Payer: 59

## 2018-10-22 ENCOUNTER — Inpatient Hospital Stay (HOSPITAL_COMMUNITY): Payer: 59 | Admitting: Physical Therapy

## 2018-10-22 DIAGNOSIS — E871 Hypo-osmolality and hyponatremia: Secondary | ICD-10-CM

## 2018-10-22 DIAGNOSIS — T380X5A Adverse effect of glucocorticoids and synthetic analogues, initial encounter: Secondary | ICD-10-CM

## 2018-10-22 DIAGNOSIS — M7989 Other specified soft tissue disorders: Secondary | ICD-10-CM

## 2018-10-22 DIAGNOSIS — R739 Hyperglycemia, unspecified: Secondary | ICD-10-CM

## 2018-10-22 DIAGNOSIS — I611 Nontraumatic intracerebral hemorrhage in hemisphere, cortical: Secondary | ICD-10-CM

## 2018-10-22 DIAGNOSIS — D72829 Elevated white blood cell count, unspecified: Secondary | ICD-10-CM

## 2018-10-22 DIAGNOSIS — D72828 Other elevated white blood cell count: Secondary | ICD-10-CM

## 2018-10-22 DIAGNOSIS — D496 Neoplasm of unspecified behavior of brain: Secondary | ICD-10-CM

## 2018-10-22 DIAGNOSIS — R7401 Elevation of levels of liver transaminase levels: Secondary | ICD-10-CM

## 2018-10-22 DIAGNOSIS — G8110 Spastic hemiplegia affecting unspecified side: Secondary | ICD-10-CM

## 2018-10-22 LAB — COMPREHENSIVE METABOLIC PANEL
ALT: 77 U/L — ABNORMAL HIGH (ref 0–44)
AST: 63 U/L — ABNORMAL HIGH (ref 15–41)
Albumin: 3.3 g/dL — ABNORMAL LOW (ref 3.5–5.0)
Alkaline Phosphatase: 49 U/L (ref 38–126)
Anion gap: 14 (ref 5–15)
BUN: 23 mg/dL — ABNORMAL HIGH (ref 6–20)
CO2: 23 mmol/L (ref 22–32)
Calcium: 9.2 mg/dL (ref 8.9–10.3)
Chloride: 97 mmol/L — ABNORMAL LOW (ref 98–111)
Creatinine, Ser: 0.89 mg/dL (ref 0.61–1.24)
GFR calc Af Amer: >60 mL/min (ref >60)
GFR calc non Af Amer: >60 mL/min (ref >60)
Glucose, Bld: 126 mg/dL — ABNORMAL HIGH (ref 70–99)
Potassium: 4.7 mmol/L (ref 3.5–5.1)
Sodium: 134 mmol/L — ABNORMAL LOW (ref 135–145)
Total Bilirubin: 0.8 mg/dL (ref 0.3–1.2)
Total Protein: 7.4 g/dL (ref 6.5–8.1)

## 2018-10-22 LAB — CBC WITH DIFFERENTIAL/PLATELET
Abs Immature Granulocytes: 0.97 10*3/uL — ABNORMAL HIGH (ref 0.00–0.07)
Basophils Absolute: 0.1 10*3/uL (ref 0.0–0.1)
Basophils Relative: 0 %
Eosinophils Absolute: 0 10*3/uL (ref 0.0–0.5)
Eosinophils Relative: 0 %
HCT: 38.1 % — ABNORMAL LOW (ref 39.0–52.0)
Hemoglobin: 13.1 g/dL (ref 13.0–17.0)
Immature Granulocytes: 5 %
Lymphocytes Relative: 9 %
Lymphs Abs: 1.6 10*3/uL (ref 0.7–4.0)
MCH: 30.2 pg (ref 26.0–34.0)
MCHC: 34.4 g/dL (ref 30.0–36.0)
MCV: 87.8 fL (ref 80.0–100.0)
Monocytes Absolute: 1 10*3/uL (ref 0.1–1.0)
Monocytes Relative: 6 %
Neutro Abs: 14.4 10*3/uL — ABNORMAL HIGH (ref 1.7–7.7)
Neutrophils Relative %: 80 %
Platelets: 300 10*3/uL (ref 150–400)
RBC: 4.34 MIL/uL (ref 4.22–5.81)
RDW: 12.8 % (ref 11.5–15.5)
WBC: 18 10*3/uL — ABNORMAL HIGH (ref 4.0–10.5)
nRBC: 0 % (ref 0.0–0.2)

## 2018-10-22 MED ORDER — PANTOPRAZOLE SODIUM 40 MG PO TBEC
40.0000 mg | DELAYED_RELEASE_TABLET | Freq: Every day | ORAL | Status: DC
  Administered 2018-10-22 – 2018-10-28 (×7): 40 mg via ORAL
  Filled 2018-10-22 (×8): qty 1

## 2018-10-22 NOTE — Progress Notes (Signed)
Inpatient Rehabilitation  Patient information reviewed and entered into eRehab system by Vishaal Strollo M. Marlet Korte, M.A., CCC/SLP, PPS Coordinator.  Information including medical coding, functional ability and quality indicators will be reviewed and updated through discharge.    

## 2018-10-22 NOTE — IPOC Note (Addendum)
Individualized overall Plan of Care (IPOC) Patient Details Name: Gregory Moreno MRN: [id number] DOB: May 12, 1995  Admitting Diagnosis: Intracranial tumor [facility])  Hospital Problems: Principal Problem:   Intracranial tumor ([place]) Active Problems:   ICH (intracerebral hemorrhage) (HCC)   Steroid-induced hyperglycemia   Spastic hemiplegia affecting nondominant side (HCC)   Hyponatremia   Transaminitis   Leucocytosis     Functional Problem List: Nursing Bladder, Bowel, Medication Management, Safety, Skin Integrity  PT Balance, Perception, Behavior, Safety, Sensory, Endurance, Skin Integrity, Motor, Nutrition, Pain  OT Balance, Behavior, Cognition, Endurance, Motor, Pain, Perception, Safety, Sensory, Vision  SLP Cognition, Linguistic, Nutrition  TR         Basic ADL's: OT Eating, Grooming, Bathing, Dressing, Toileting     Advanced  ADL's: OT       Transfers: PT Bed Mobility, Bed to Chair, Car, Manufacturing systems engineer, Metallurgist: PT Ambulation, Data processing manager, Emergency planning/management officer     Additional Impairments: OT None  SLP Swallowing, Communication, Social Cognition comprehension Social Interaction, Problem Solving, Memory, Attention, Awareness  TR      Anticipated Outcomes Item Anticipated Outcome  Self Feeding S  Swallowing  Min A   Basic self-care  MIN A UB and MOD A LB  Toileting  MIN A shower; MOD A toilet   Bathroom Transfers MIN A  Bowel/Bladder  max assist  Transfers  min assist with LRAD  Locomotion  mod assist with LRAD  Communication  Mod A  Cognition  Min A  Pain  less than 3 out of 10  Safety/Judgment  max assist   Therapy Plan: PT Intensity: Minimum of 1-2 x/day ,45 to 90 minutes PT Frequency: 5 out of 7 days PT Duration Estimated Length of Stay: 3.5 - 4 weeks OT Intensity: Minimum of 1-2 x/day, 45 to 90 minutes OT Frequency: 5 out of 7 days OT Duration/Estimated Length of Stay: 4-4.5 weeks SLP Intensity: Minumum of 1-2 x/day, 30  to 90 minutes SLP Frequency: 3 to 5 out of 7 days SLP Duration/Estimated Length of Stay: 4 weeks    Team Interventions: Nursing Interventions Patient/Family Education, Bladder Management, Bowel Management, Medication Management, Skin Care/Wound Management, Dysphagia/Aspiration Precaution Training, Discharge Planning  PT interventions Ambulation/gait training, Community reintegration, DME/adaptive equipment instruction, Neuromuscular re-education, Psychosocial support, Stair training, UE/LE Strength taining/ROM, Wheelchair propulsion/positioning, UE/LE Coordination activities, Therapeutic Activities, Skin care/wound management, Pain management, Functional electrical stimulation, Discharge planning, Training and development officer, Cognitive remediation/compensation, Functional mobility training, Patient/family education, Splinting/orthotics, Therapeutic Exercise, Visual/perceptual remediation/compensation, Disease management/prevention  OT Interventions Balance/vestibular training, Discharge planning, Functional electrical stimulation, Pain management, Self Care/advanced ADL retraining, Therapeutic Activities, UE/LE Coordination activities, Visual/perceptual remediation/compensation, Therapeutic Exercise, Skin care/wound managment, Patient/family education, Cognitive remediation/compensation, Disease mangement/prevention, Functional mobility training, Community reintegration, Engineer, drilling, Neuromuscular re-education, Psychosocial support, Splinting/orthotics, UE/LE Strength taining/ROM, Wheelchair propulsion/positioning  SLP Interventions Cognitive remediation/compensation, Dysphagia/aspiration precaution training, Internal/external aids, Speech/Language facilitation, Therapeutic Activities, Environmental controls, Cueing hierarchy, Functional tasks, Patient/family education  TR Interventions    SW/CM Interventions Discharge Planning, Psychosocial Support, Patient/Family Education    Barriers to Discharge MD  Medical stability, Behavior and Nutritional means  Nursing Nutrition means, Incontinence    PT Home environment access/layout 1 step without rails to enter home  OT Incontinence, Pending chemo/radiation, Behavior    SLP      SW       Team Discharge Planning: Destination: PT-Home ,OT- Home , SLP-Home Projected Follow-up: PT-24 hour supervision/assistance(HHPT vs OPPT TBD), OT-  Home health OT, SLP-Home Health SLP, Outpatient  SLP, 24 hour supervision/assistance Projected Equipment Needs: PT-To be determined, OT- 3 in 1 bedside comode, Tub/shower bench, SLP-None recommended by SLP Equipment Details: PT- , OT-  Patient/family involved in discharge planning: PT- Patient,  OT-Patient, Family member/caregiver, SLP-Family member/caregiver  MD ELOS: 30-35 days. Medical Rehab Prognosis:  Good Assessment: 23 year old right-handed male with unremarkable past medical history.Presented 10/13/2018 with intermittent headache right-sided weakness. No reports of any recent trauma. 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 CT angiogram of head and neck with no aneurysm noted. Underwent left craniotomy for hematoma evacuation and tumor resection 10/13/2018 per Dr. Venetia Constable. Per mother, no speech since surgery- was mumbling prior.Placed on Decadron protocol. Patient did have some leukocytosis 21,000 felt to be induced by steroid. Pathology report pending. 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 Dysphasia #2 thin liquid diet. Patient with resulting functional deficits with mobility, transfers, cognition, swallowing.  Will set goals for Min A with PT/OT and Min/Mod A with SLP.  Due to the current state of emergency, patients may not be receiving their 3-hours of Medicare-mandated therapy.  See Team Conference Notes for weekly updates to the plan of  care

## 2018-10-22 NOTE — Progress Notes (Signed)
[facility] PHYSICAL MEDICINE & REHABILITATION PROGRESS NOTE  Subjective/Complaints: Patient seen sitting up in bed being fed by nurse tech.  No reported issues overnight.  ROS: Unable to assess due to cognition  Objective: Vital Signs: Blood pressure 114/83, pulse 96, temperature 98.2 F (36.8 C), temperature source Oral, resp. rate 16, height 5\' 7"  (1.702 m), weight 66.1 kg, SpO2 98 %. No results found. Recent Labs    10/21/18 1638 10/22/18 0501  WBC 17.2* 18.0*  HGB 13.5 13.1  HCT 40.3 38.1*  PLT 343 300   Recent Labs    10/21/18 1638 10/22/18 0501  NA  --  134*  K  --  4.7  CL  --  97*  CO2  --  23  GLUCOSE  --  126*  BUN  --  23*  CREATININE 1.13 0.89  CALCIUM  --  9.2    Physical Exam: BP 114/83 (BP Location: Left Arm)   Pulse 96   Temp 98.2 F (36.8 C) (Oral)   Resp 16   Ht 5\' 7"  (1.702 m)   Wt 66.1 kg   SpO2 98%   BMI 22.82 kg/m  Constitutional: No distress . Vital signs reviewed. HENT: Left facial edema Eyes: Left eye closed. No discharge. Cardiovascular: No JVD. Respiratory: Normal effort.  No stridor. GI: Non-distended. Skin: Warm and dry.  Intact. Psych: Unable to assess due to cognition Musc: No edema in extremities.  No tenderness in extremities. Neuro: Alert Global aphasia Motor: Limited by ability to follow commands, but seen moving left upper extremity MAS: 1/4 right upper extremity  Assessment/Plan: 1. 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  Physiatrist is providing close team supervision and 24 hour management of active medical problems listed below.  Physiatrist and rehab team continue to assess barriers to discharge/monitor patient progress toward functional and medical goals  Care Tool:  Bathing        Body parts bathed by helper: Front perineal area, Buttocks     Bathing  assist Assist Level: Maximal Assistance - Patient 24 - 49%     Upper Body Dressing/Undressing Upper body dressing   What is the patient wearing?: Hospital gown only    Upper body assist Assist Level: Total Assistance - Patient < 25%    Lower Body Dressing/Undressing Lower body dressing      What is the patient wearing?: Incontinence brief     Lower body assist Assist for lower body dressing: Total Assistance - Patient < 25%     Toileting Toileting    Toileting assist Assist for toileting: Total Assistance - Patient < 25%     Transfers Chair/bed transfer  Transfers assist  Chair/bed transfer activity did not occur: Safety/medical concerns        Locomotion Ambulation   Ambulation assist              Walk 10 feet activity   Assist           Walk 50 feet activity   Assist           Walk 150 feet activity   Assist           Walk 10 feet on uneven surface  activity   Assist           Wheelchair     Assist  Wheelchair 50 feet with 2 turns activity    Assist            Wheelchair 150 feet activity     Assist            Medical Problem List and Plan: 1.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   Pathology report pending  Begin CIR evaluations  Imaging personally reviewed- left brain lesion status post decompression  Continue steroids for now 2. Antithrombotics: -DVT/anticoagulation:Subcutaneous heparin initiated 10/16/2018 -antiplatelet therapy: N/A 3. Pain Management:Tylenol as needed 4. Mood:Provide emotional support -antipsychotic agents: N/A 5. Neuropsych: This patientis not of making decisions on hisown behalf. 6. Skin/Wound  Care:Routine skin checks 7. Fluids/Electrolytes/Nutrition:Routine in and outs  8. Dysphagia. Dysphasia #2 thin liquids. Follow-up speech therapy  Advance diet as tolerated 9. Aphasia in setting of poor initiation and poor attention 10. Constipation  Bowel meds as necessary 11. Spasticity  Continue baclofen, monitor for lethargy 12. Hiccups  Continue baclofen, will consider decreasing 13.  Steroid-induced hyperglycemia  Continue to monitor 14.  Hyponatremia  Sodium 134 on 10/7  Continue to monitor 15.  Transaminitis  LFTs elevated on 10/7  Continue to monitor 16.  Leukocytosis-likely steroid-induced  WBCs 18.0 on 10/7  Afebrile  Continue to monitor   LOS: 1 days A FACE TO FACE EVALUATION WAS PERFORMED  Zhane Bluitt Lorie Phenix 10/22/2018, 9:13 AM

## 2018-10-22 NOTE — Evaluation (Signed)
Occupational Therapy Assessment and Plan  Patient Details  Name: Gregory Moreno MRN: [id number] Date of Birth: 12/23/1995  OT Diagnosis: abnormal posture, apraxia, cognitive deficits, disturbance of vision, hemiplegia affecting dominant side and muscle weakness (generalized) Rehab Potential:   ELOS: 4-4.5 weeks   Today's Date: 10/22/2018 OT Individual Time: 8676-1950 OT Individual Time Calculation (min): 60 min     Problem List:  Patient Active Problem List   Diagnosis Date Noted  . Intracranial tumor ([place])   . Steroid-induced hyperglycemia   . Spastic hemiplegia affecting nondominant side ([place])   . Hyponatremia   . Transaminitis   . Leucocytosis   . Right spastic hemiplegia ([place]) 10/21/2018  . Dysphagia 10/21/2018  . 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 Brain mass   . ICH (intracerebral hemorrhage) ([place]) 10/13/2018    Past Medical History: History reviewed. No pertinent past medical history. Past Surgical History:  Past Surgical History:  Procedure Laterality Date  . CRANIOTOMY Left 10/13/2018   Procedure: LEFT CRANIOTOMY FOR TUMOR RESECTION;  Surgeon: Judith Part, MD;  Location: [facility];  Service: Neurosurgery;  Laterality: Left;    Assessment & Plan Clinical Impression: [name] a 23 year old right-handed male with unremarkable past medical history. 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. Independent prior to admission. He also has his own pilots license and in really good shape- ran cross country. Parents with good support. Presented 10/13/2018 with intermittent headache right-sided weakness. No reports of any recent trauma. 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 CT angiogram of head and neck with no aneurysm noted. Underwent left craniotomy for hematoma evacuation and tumor resection 10/13/2018 per Dr. Venetia Constable. Per mother, no  speech since surgery- was mumbling prior.Placed on Decadron protocol. Patient did have some leukocytosis 21,000 felt to be induced by steroid. Pathology report pending. 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 Dysphasia #2 thin liquid diet. Subcutaneous heparin added for DVT prophylaxis 10/16/2018. Therapy evaluations completed patient was admitted for a comprehensive rehab program  Patient currently requires total with basic self-care skills secondary to muscle weakness, decreased cardiorespiratoy endurance, impaired timing and sequencing, unbalanced muscle activation, motor apraxia, ataxia, decreased coordination and decreased motor planning, decreased visual perceptual skills and decreased visual motor skills, decreased midline orientation, decreased attention to right and ideational apraxia, decreased initiation, decreased attention, decreased awareness, decreased problem solving, decreased safety awareness, decreased memory and delayed processing and decreased sitting balance, decreased standing balance, decreased postural control, hemiplegia and decreased balance strategies.  Prior to hospitalization, patient could complete BADL/IADL/school/vocation with independent .  Patient will benefit from skilled intervention to decrease level of assist with basic self-care skills and increase independence with basic self-care skills prior to discharge home with care partner.  Anticipate patient will require 24 hour supervision and minimal physical assistance and follow up home health.  OT - End of Session Activity Tolerance: Tolerates < 10 min activity, no significant change in vital signs Endurance Deficit: Yes OT Assessment Rehab Potential (ACUTE ONLY): Good OT Barriers to Discharge: Incontinence;Pending chemo/radiation;Behavior OT Patient demonstrates impairments in the following area(s):  Balance;Behavior;Cognition;Endurance;Motor;Pain;Perception;Safety;Sensory;Vision OT Basic ADL's Functional Problem(s): Eating;Grooming;Bathing;Dressing;Toileting OT Transfers Functional Problem(s): Toilet;Tub/Shower OT Additional Impairment(s): None OT Plan OT Intensity: Minimum of 1-2 x/day, 45 to 90 minutes OT Frequency: 5 out of 7 days OT Duration/Estimated Length of Stay: 4-4.5 weeks OT Treatment/Interventions: Balance/vestibular training;Discharge planning;Functional electrical stimulation;Pain management;Self Care/advanced ADL  retraining;Therapeutic Activities;UE/LE Coordination activities;Visual/perceptual remediation/compensation;Therapeutic Exercise;Skin care/wound managment;Patient/family education;Cognitive remediation/compensation;Disease mangement/prevention;Functional mobility training;Community reintegration;DME/adaptive equipment instruction;Neuromuscular re-education;Psychosocial support;Splinting/orthotics;UE/LE Strength taining/ROM;Wheelchair propulsion/positioning OT Self Feeding Anticipated Outcome(s): S OT Basic Self-Care Anticipated Outcome(s): MIN A UB and MOD A LB OT Toileting Anticipated Outcome(s): MIN A shower; MOD A toilet OT Bathroom Transfers Anticipated Outcome(s): MIN A OT Recommendation Patient destination: Home Follow Up Recommendations: Home health OT Equipment Recommended: 3 in 1 bedside comode;Tub/shower bench   Skilled Therapeutic Intervention 1:1. Pt received in bed asleep with mother present. Education provided on role/purpose of OT, CIR, ELOS, POC and CVA recovery. Pt with decreased arousal, attention, initation and delayed processing impacting performance of ADLs. Pt requires MAX A for bed mobility and +2 A to sit to stand in stedy to transfer to w/c. Pt washes with overall MAX HOH A to wash face and UB with tactile cuing required for sequencing and initiation. Pt requires total A to don all clothing and sit to stand at sink with +2 advnacing past hips.  Pt unable to continue brushing teeth despite HOH A to start and initiate. Exited session with tp seated in w/c, call light in reach and all needs met.  OT Evaluation Precautions/Restrictions  Precautions Precautions: Fall Precaution Comments: L crani Restrictions Weight Bearing Restrictions: No General Chart Reviewed: Yes Family/Caregiver Present: Yes Vital Signs   Pain Pain Assessment Pain Scale: Faces(Pt unable to say but could squeeze my hand for pain ) Faces Pain Scale: No hurt Pain Intervention(s): Medication (See eMAR) PAINAD (Pain Assessment in Advanced Dementia) Breathing: normal Negative Vocalization: none Facial Expression: smiling or inexpressive Body Language: relaxed Consolability: no need to console PAINAD Score: 0 Critical Care Pain Observation Tool (CPOT) Facial Expression: Relaxed, neutral Body Movements: Absence of movements Muscle Tension: Relaxed Home Living/Prior Functioning Home Living Family/patient expects to be discharged to:: Private residence Living Arrangements: Other (Comment)(Lives with mom and dad) Available Help at Discharge: Family, Available 24 hours/day Type of Home: House Home Access: Stairs to enter Home Layout: Two level, Able to live on main level with bedroom/bathroom Bathroom Shower/Tub: Optometrist: Yes  Lives With: Family Prior Function Vocation: Ship broker Comments: 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[place] [employer] affairs [employer]) has apartment there, has his pilot's license ADL ADL Grooming: Maximal assistance Where Assessed-Grooming: Sitting at sink, Clinical biochemist Bathing: Maximal assistance Where Assessed-Upper Body Bathing: Sitting at sink, Wheelchair Lower Body Bathing: Maximal assistance Where Assessed-Lower Body Bathing: Sitting at sink, Wheelchair Upper Body Dressing: Maximal assistance Where Assessed-Upper Body Dressing: Sitting at sink, Wheelchair Lower Body Dressing:  Maximal assistance Where Assessed-Lower Body Dressing: Sitting at sink, Wheelchair Toileting: Unable to assess Toilet Transfer: Dependent(+2) Armed forces technical officer Method: (STEDY) Vision Baseline Vision/History: No visual deficits Vision Assessment?: Vision impaired- to be further tested in functional context Additional Comments: max mulitmodal cues to scan functionally for items. L eye ptosis Perception  Perception: Impaired Inattention/Neglect: Does not attend to right side of body Praxis Praxis: Impaired Praxis Impairment Details: Ideation;Initiation;Motor planning;Perseveration Cognition Overall Cognitive Status: Impaired/Different from baseline Arousal/Alertness: Lethargic Orientation Level: (nonverbal d/t aphasia) Year: (nonverbal d/t aphasia) Month: (nonverbal d/t aphasia) Day of Week: (nonverbal d/t aphasia) Memory: (nonverbal d/t aphasia) Immediate Memory Recall: (nonverbal d/t aphasia) Memory Recall Sock: (nonverbal d/t aphasia) Memory Recall Blue: (nonverbal d/t aphasia) Memory Recall Bed: (nonverbal d/t aphasia) Attention: Focused Focused Attention: Impaired Focused Attention Impairment: Functional basic Awareness: Impaired Problem Solving: Impaired Safety/Judgment: Impaired Sensation Sensation Light Touch: Impaired by gross assessment Proprioception: Impaired by gross assessment Coordination Gross Motor Movements  are Fluid and Coordinated: No Fine Motor Movements are Fluid and Coordinated: No Motor  Motor Motor: Hemiplegia;Motor apraxia;Abnormal postural alignment and control Mobility  Bed Mobility Bed Mobility: Supine to Sit;Sit to Supine Supine to Sit: Maximal Assistance - Patient - Patient 25-49% Sit to Supine: Moderate Assistance - Patient 50-74% Transfers Sit to Stand: Total Assistance - Patient < 25% Stand to Sit: Total Assistance - Patient < 25%  Trunk/Postural Assessment  Cervical Assessment Cervical Assessment: Exceptions to WFL(head forward; weak  cervical extensors) Thoracic Assessment Thoracic Assessment: Exceptions to WFL(rounded shoulders) Lumbar Assessment Lumbar Assessment: Exceptions to WFL(posterior pelvic tilt) Postural Control Postural Control: Deficits on evaluation Head Control: insufficnet Trunk Control: insufficient Righting Reactions: absent Protective Responses: absent  Balance Balance Balance Assessed: Yes Static Sitting Balance Static Sitting - Level of Assistance: 2: Max assist Dynamic Sitting Balance Sitting balance - Comments: leans posterior and limited head and neck control in sitting; able to lift partly voluntarily but not consistently to command Static Standing Balance Static Standing - Level of Assistance: 1: +2 Total assist Extremity/Trunk Assessment RUE Assessment RUE Assessment: Exceptions to [facility] General Strength Comments: 2-/5 RUE Body System: Neuro Brunstrum levels for arm and hand: Arm;Hand Brunstrum level for arm: Stage III Synergy is performed voluntarily Brunstrum level for hand: Stage II Synergy is developing LUE Assessment LUE Assessment: Within Functional Limits     Refer to Care Plan for Long Term Goals  Recommendations for other services: None    Discharge Criteria: Patient will be discharged from OT if patient refuses treatment 3 consecutive times without medical reason, if treatment goals not met, if there is a change in medical status, if patient makes no progress towards goals or if patient is discharged from hospital.  The above assessment, treatment plan, treatment alternatives and goals were discussed and mutually agreed upon: by patient  Tonny Branch 10/22/2018, 11:31 AM

## 2018-10-22 NOTE — Progress Notes (Signed)
Bilateral lower extremity venous duplex completed. Refer to "CV Proc" under chart review to view preliminary results.  10/22/2018 7:03 PM Maudry Mayhew, MHA, RVT, RDCS, RDMS

## 2018-10-22 NOTE — Evaluation (Signed)
Physical Therapy Assessment and Plan  Patient Details  Name: Gregory Moreno MRN: [id number] Date of Birth: Dec 11, 1995  PT Diagnosis: Abnormal posture, Abnormality of gait, Cognitive deficits, Coordination disorder, Difficulty walking, Hemiparesis dominant, Impaired cognition, Impaired sensation and Muscle weakness Rehab Potential: Fair ELOS: 3.5 - 4 weeks   Today's Date: 10/22/2018 PT Individual Time: 1106-1204 PT Individual Time Calculation (min): 58 min    Problem List:  Patient Active Problem List   Diagnosis Date Noted  . 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 Steroid-induced hyperglycemia   . Spastic hemiplegia affecting nondominant side ([place])   . Hyponatremia   . Transaminitis   . Leucocytosis   . Right spastic hemiplegia ([place]) 10/21/2018  . Dysphagia 10/21/2018  . 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 Brain mass   . ICH (intracerebral hemorrhage) ([place]) 10/13/2018    Past Medical History: History reviewed. No pertinent past medical history. Past Surgical History:  Past Surgical History:  Procedure Laterality Date  . CRANIOTOMY Left 10/13/2018   Procedure: LEFT CRANIOTOMY FOR TUMOR RESECTION;  Surgeon: Judith Part, MD;  Location: [place];  Service: Neurosurgery;  Laterality: Left;    Assessment & Plan Clinical Impression: Patient is a 23 y.o. year old male with unremarkable past medical history. 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. Independent prior to admission. He also has his own pilots license and in really good shape- ran cross country. Parents with good support. Presented 10/13/2018 with intermittent headache right-sided weakness. No reports of any recent trauma. 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 CT angiogram of head and neck with no aneurysm noted. Underwent left craniotomy for hematoma evacuation and tumor resection 10/13/2018 per  Dr. Venetia Constable. Per mother, no speech since surgery- was mumbling prior.Placed on Decadron protocol. Patient did have some leukocytosis 21,000 felt to be induced by steroid. Pathology report pending. 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 Dysphasia #2 thin liquid diet. Subcutaneous heparin added for DVT prophylaxis 10/16/2018. Therapy evaluations completed patient was admitted for a comprehensive rehab program.  Patient transferred to CIR on 10/21/2018 .   Patient currently requires total with mobility secondary to muscle weakness, decreased cardiorespiratoy endurance, decreased coordination and decreased motor planning, decreased visual acuity and decreased visual motor skills, decreased initiation, decreased attention, decreased awareness, decreased problem solving, decreased safety awareness, decreased memory and delayed processing, and decreased sitting balance, decreased standing balance, decreased postural control, decreased balance strategies and R hemiparesis.  Prior to hospitalization, patient was independent  with mobility and lived with Family in a House home.  Home access is 1Stairs to enter.  Patient will benefit from skilled PT intervention to maximize safe functional mobility, minimize fall risk and decrease caregiver burden for planned discharge home with 24 hour assist.  Anticipate patient will HHPT vs OPPT at discharge.  PT - End of Session Activity Tolerance: Tolerates 30+ min activity with multiple rests Endurance Deficit: Yes Endurance Deficit Description: generalized deconditioning, fatigue PT Assessment Rehab Potential (ACUTE/IP ONLY): Fair PT Barriers to Discharge: Home environment access/layout PT Barriers to Discharge Comments: 1 step without rails to enter home PT Patient demonstrates impairments in the following area(s): Balance;Perception;Behavior;Safety;Sensory;Endurance;Skin Integrity;Motor;Nutrition;Pain PT Transfers  Functional Problem(s): Bed Mobility;Bed to Chair;Car;Furniture PT Locomotion Functional Problem(s): Ambulation;Stairs;Wheelchair Mobility PT Plan PT Intensity: Minimum of 1-2 x/day ,45 to 90 minutes PT Frequency: 5 out of 7 days PT Duration Estimated Length of Stay:  3.5 - 4 weeks PT Treatment/Interventions: Ambulation/gait training;Community reintegration;DME/adaptive equipment instruction;Neuromuscular re-education;Psychosocial support;Stair training;UE/LE Strength taining/ROM;Wheelchair propulsion/positioning;UE/LE Coordination activities;Therapeutic Activities;Skin care/wound management;Pain management;Functional electrical stimulation;Discharge planning;Balance/vestibular training;Cognitive remediation/compensation;Functional mobility training;Patient/family education;Splinting/orthotics;Therapeutic Exercise;Visual/perceptual remediation/compensation;Disease management/prevention PT Transfers Anticipated Outcome(s): min assist with LRAD PT Locomotion Anticipated Outcome(s): mod assist with LRAD PT Recommendation Recommendations for Other Services: Speech consult Follow Up Recommendations: 24 hour supervision/assistance(HHPT vs OPPT TBD) Patient destination: Home Equipment Recommended: To be determined  Skilled Therapeutic Intervention Patient received sound asleep in bed, requiring cold wash cloth on face, removing covers, and light sternal rub to awaken. Educated pt's mother [name]) on ELOS, daily therapy schedule, weekly team meeting, & other various CIR information. Provided pt with TIS w/c & adjusted headrest. Pt requires total assist +2 for supine>sit, total assist for static sitting balance EOB (pt attempts to support himself with LUE & slightly pushes with UE), total assist +2 for sit<>stand x 2 to stand EOB for a max of 10 seconds with pt not activating BLE during task. Pt does transfer bed>w/c with total assist +2 for squat pivot with significantly extra time for slight initiation of  task. While sitting on EOB focused on static sitting balance with upright posture, holding head up as pt sits with neck forward flexed. Once in w/c pt with some initiation to lift LUE & move LLE onto leg rest. Attempted to have pt initiate wiping face with cloth. At end of session pt left in TIS w/c with seat belt donned & mother present in room to supervise pt.  PT Evaluation Precautions/Restrictions Precautions Precautions: Fall Precaution Comments: L crani Restrictions Weight Bearing Restrictions: No   General Chart Reviewed: Yes Response to Previous Treatment: Other (Comment)(mother reports fatigue following OT session this AM) Family/Caregiver Present: Yes(mother, [name])   Vital Signs Pt slightly diaphoretic sitting EOB but HR = 116 bpm, BP = 123/79 mmHg (LUE).  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 Vision impaired - to be assessed further. Vision - Assessment Pt does not open L eye during session. Perception Perception: Impaired Inattention/Neglect: Does not attend to right side of body Praxis Praxis: Impaired Praxis Impairment Details: Ideation;Initiation;Motor planning;Perseveration   Cognition Overall  Cognitive Status: Impaired/Different from baseline Arousal/Alertness: Lethargic Orientation Level: (unable to assess 2/2 communication deficits) Attention: Focused Focused Attention: Impaired Focused Attention Impairment: Functional basic Awareness: Impaired Awareness Impairment: Intellectual impairment Problem Solving: Impaired Safety/Judgment: Impaired  Sensation Sensation Light Touch: Impaired by gross assessment Proprioception: Impaired by gross assessment Coordination Gross Motor Movements are Fluid and Coordinated: No Fine Motor Movements are Fluid and Coordinated: No  Motor  Motor Motor: Hemiplegia;Motor apraxia;Abnormal postural alignment and control, generalized deconditioning   Mobility Bed Mobility Bed Mobility: Supine to Sit Supine to Sit: 2 Helpers;Total Assistance - Patient < 25% Transfers Transfers: Sit to Stand;Stand to Sit;Squat Pivot Transfers Sit to Stand: Total Assistance - Patient < 25%;2 Helpers Stand to Sit: Total Assistance -  Patient < 25%;2 Dance movement psychotherapist Transfers: Total Assistance - Patient < 25%;2 Helpers  Locomotion  Gait Ambulation: No Gait Gait: No Stairs / Additional Locomotion Stairs: No Wheelchair Mobility Wheelchair Mobility: No   Trunk/Postural Assessment  Cervical Assessment Cervical Assessment: Exceptions to WFL(head fully flexed foward, unable to hold head upright) Thoracic Assessment Thoracic Assessment: Exceptions to WFL(rounded shoulders) Lumbar Assessment Lumbar Assessment: Exceptions to WFL(posterior pelvic tilt) Postural Control Postural Control: Deficits on evaluation Head Control: insufficient Trunk Control: insufficient Righting Reactions: absent Protective Responses: absent   Balance Balance Balance Assessed: Yes Static Sitting Balance Static Sitting - Level of Assistance: 2: total assist, posterior lean  Extremity Assessment  No functional movement noted in RUE or RLE, pt able to slightly lift LUE  off of lap & some slight activation noted with pt pulling L foot back on leg rest.   Refer to Care Plan for Long Term Goals  Recommendations for other services: None   Discharge Criteria: Patient will be discharged from PT if patient refuses treatment 3 consecutive times without medical reason, if treatment goals not met, if there is a change in medical status, if patient makes no progress towards goals or if patient is discharged from hospital.  The above assessment, treatment plan, treatment alternatives and goals were discussed and mutually agreed upon: by family  [place] 10/22/2018, 12:42 PM

## 2018-10-22 NOTE — Progress Notes (Signed)
Occupational Therapy Session Note  Patient Details  Name: KIPPY GOHMAN MRN: [id number] Date of Birth: Jul 14, 1995  Today's Date: 10/22/2018 OT Individual Time: 1600-1630 OT Individual Time Calculation (min): 30 min    Short Term Goals: Week 1:  OT Short Term Goal 1 (Week 1): Pt will sit EOB/EOM with MOD A during functional dynamic sitting balance task OT Short Term Goal 2 (Week 1): Pt will sit to stand with MAX A of 1 in stedy OT Short Term Goal 3 (Week 1): Pt will initate washing face with VC only OT Short Term Goal 4 (Week 1): Pt will locate toothbrush with mod multimodal cues for improved visual scanning OT Short Term Goal 5 (Week 1): Pt will completes 1/4 steps of UB dressing  Skilled Therapeutic Interventions/Progress Updates:    Pt received in TIS w/c with mother present. Discussed OT POC and rehab plan of care. Pt minimally responsive with staff and this OT suggested going outside to offer different sensory experience and allow pt to potentially increase mood. While pt sat outside, his mother provided useful insight into pt hobbies, preferences, and PLOF. [name]  RUE was passively ranged through all planes of motion with spasticity present 50% of the time. Brain injury edu provided to pt's mother throughout session. Pt was returned to his room and left sitting up with all needs met.   Therapy Documentation Precautions:  Precautions Precautions: Fall Precaution Comments: L crani Restrictions Weight Bearing Restrictions: No   Therapy/Group: Individual Therapy  [name] 10/22/2018, 4:57 PM

## 2018-10-22 NOTE — Evaluation (Signed)
Speech Language Pathology Assessment and Plan  Patient Details  Name: Gregory Moreno MRN: [id number] Date of Birth: [birth year]  SLP Diagnosis: Aphasia;Cognitive Impairments;Dysphagia;Apraxia  Rehab Potential: Good ELOS: 4 weeks    Today's Date: 10/22/2018 SLP Individual Time: 1400-1500 SLP Individual Time Calculation (min): 60 min   Problem List:  Patient Active Problem List   Diagnosis Date Noted  . 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 Steroid-induced hyperglycemia   . Spastic hemiplegia affecting nondominant side ([place])   . Hyponatremia   . Transaminitis   . Leucocytosis   . Right spastic hemiplegia ([place]) 10/21/2018  . Dysphagia 10/21/2018  . 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 Brain mass   . ICH (intracerebral hemorrhage) ([place]) 10/13/2018   Past Medical History: History reviewed. No pertinent past medical history. Past Surgical History:  Past Surgical History:  Procedure Laterality Date  . CRANIOTOMY Left 10/13/2018   Procedure: LEFT CRANIOTOMY FOR TUMOR RESECTION;  Surgeon: Judith Part, MD;  Location: [facility];  Service: Neurosurgery;  Laterality: Left;    Assessment / Plan / Recommendation Clinical Impression Patient is a 23 year old right-handed male with unremarkable past medical history. 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. Independent prior to admission.  Presented 10/13/2018 with intermittent headache right-sided weakness. No reports of any recent trauma. 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 CT angiogram of head and neck with no aneurysm noted. Underwent left craniotomy for hematoma evacuation and tumor resection 10/13/2018 per Dr. Venetia Constable. Per mother, no speech since surgery- was mumbling prior.Placed on Decadron protocol. Pathology report pending. 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  Dysphasia 2 textures with thin liquid diet. Subcutaneous heparin added for DVT prophylaxis 10/16/2018. Therapy evaluations completed patient was admitted for a comprehensive rehab program 10/21/18.  Patient demonstrates a severe aphasia with suspected apraxia component. Patient followed commands in ~25 of opportunities and required hand over hand assist to attempt to utilize a basic communication board to answer basic yes/no questions with 10% accuracy. Patient with no attempts to vocalize or utilize multimodal communication to express wants/needs or to move oral musculature on command. However, patient appeared to have a strong voice during coughing and was able to move his oral cavity during functional tasks like masticating.  Severe cognitive impairments were also noted, and patient required Max-Total A multimodal cues for initiation and sustained attention to functional tasks. Patient consumed thin liquids via straw with use of multiple swallows and overt coughing with large, sequential sips. Coughing was eliminated with verbal and tactile cues for single sips. Prolonged mastication and AP transit was also noted with solid textures with suspected right buccal pocketing, however, difficult to determine due to patient's inability to open his oral cavity wide enough.  Recommend patient continue current diet with full supervision to maximize safety. Patient would benefit from skilled SLP intervention to maximize his cognitive-linguistic and swallowing function prior to discharge.    Skilled Therapeutic Interventions          Administered a cognitive-linguistic evaluation and BSE, please see above for details.   SLP Assessment  Patient will need skilled Speech Lanaguage Pathology Services during CIR admission    Recommendations  SLP Diet Recommendations: Dysphagia 2 (Fine chop);Thin Liquid Administration via: Straw Medication Administration: Crushed with puree Supervision: Patient able to self feed;Full  supervision/cueing for compensatory strategies;Staff to assist with self feeding Compensations: Minimize environmental distractions;Slow rate;Small sips/bites;Lingual  sweep for clearance of pocketing Postural Changes and/or Swallow Maneuvers: Seated upright 90 degrees Oral Care Recommendations: Oral care BID;Oral care before and after PO Patient destination: Home Follow up Recommendations: Home Health SLP;Outpatient SLP;24 hour supervision/assistance Equipment Recommended: None recommended by SLP    SLP Frequency 3 to 5 out of 7 days   SLP Duration  SLP Intensity  SLP Treatment/Interventions 4 weeks  Minumum of 1-2 x/day, 30 to 90 minutes  Cognitive remediation/compensation;Dysphagia/aspiration precaution training;Internal/external aids;Speech/Language facilitation;Therapeutic Activities;Environmental controls;Cueing hierarchy;Functional tasks;Patient/family education    Pain Pain Assessment Pain Scale: Faces Pain Score: 0-No pain  Prior Functioning Type of Home: House  Lives With: Family Available Help at Discharge: Family;Available 24 hours/day Vocation: Student   Refer to Care Plan for Long Term Goals  Recommendations for other services: None   Discharge Criteria: Patient will be discharged from SLP if patient refuses treatment 3 consecutive times without medical reason, if treatment goals not met, if there is a change in medical status, if patient makes no progress towards goals or if patient is discharged from hospital.  The above assessment, treatment plan, treatment alternatives and goals were discussed and mutually agreed upon: by family  Loyda Costin 10/22/2018, 4:17 PM

## 2018-10-22 NOTE — Progress Notes (Signed)
Patient vitals taken. Patient Mews scored yellow for HR 105 and LOC. Patient has a hx of tachycardia. Patient LOC is his baseline. Patient is stable , HR is now 87. will continue to monitor and assess.

## 2018-10-23 ENCOUNTER — Other Ambulatory Visit: Payer: Self-pay

## 2018-10-23 ENCOUNTER — Inpatient Hospital Stay (HOSPITAL_COMMUNITY): Payer: 59 | Admitting: Physical Therapy

## 2018-10-23 ENCOUNTER — Inpatient Hospital Stay (HOSPITAL_COMMUNITY): Payer: 59

## 2018-10-23 ENCOUNTER — Inpatient Hospital Stay (HOSPITAL_COMMUNITY): Payer: 59 | Admitting: Speech Pathology

## 2018-10-23 DIAGNOSIS — G936 Cerebral edema: Secondary | ICD-10-CM

## 2018-10-23 MED ORDER — METHYLPHENIDATE HCL 5 MG PO TABS
2.5000 mg | ORAL_TABLET | Freq: Two times a day (BID) | ORAL | Status: DC
  Administered 2018-10-23 – 2018-10-27 (×8): 2.5 mg via ORAL
  Filled 2018-10-23 (×8): qty 1
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
## 2018-10-23 NOTE — Progress Notes (Signed)
Physical Therapy Session Note  Patient Details  Name: Gregory Moreno MRN: [id number] Date of Birth: 02-03-1995  Today's Date: 10/23/2018 PT Individual Time: 1430-1530 PT Individual Time Calculation (min): 60 min   Short Term Goals: Week 1:  PT Short Term Goal 1 (Week 1): Pt will consistently complete bed mobilty with max assist +1. PT Short Term Goal 2 (Week 1): Pt will transfer bed<>w/c with max assist +1. PT Short Term Goal 3 (Week 1): Pt will initiate sit>stand with max assist +1 & LRAD.  Skilled Therapeutic Interventions/Progress Updates:   Pt received in TIS, remained non-verbal throughout session, no distress or evidence of pain observed throughout session. Total assist w/c transport to/from therapy gym. Total assist squat pivot to mat table on L side.  Tactile, verbal, and manual cues for initiation, UE placement, and anterior trunk lean. Pt able to initiate all automatic and functional movements w/ min verbal/tactile cues and increased time this session.   Worked on static sitting balance in minimally distracting environment while stacking foam legos using LUE. 1 color at first and then alternating colors, min tactile/verbal cues to initiate and attend to task. Min-mod assist for static sitting balance, pt w/ R lateral lean and excessive cervical protraction and flexion. Multimodal cues to bring head into neutral, upright position, therapist unable to correct despite total assist. Maintained static sitting 5 min x2 bouts w/ reclined rest breaks on pillows. Pt eventually did not initiate any other stacking despite multiple minutes of therapist waiting for initiation.   Total assist squat pivot back to w/c on L. Worked on sit<>stands at rail. Pt initiating stand w/ tactile cues for hand placement and max manual assist for anterior trunk lean and to block R knee. Sit<>stand x3 reps w/ RUE over therapist's shoulder and max-total assist. Did not reach full upright, however upright posture did  improve w/ cues to look in mirror. 2nd helper stand-by for safety, however physical assist not needed. Returned to room and ended session resting in TIS, all needs in reach.   Therapy Documentation Precautions:  Precautions Precautions: Fall Precaution Comments: L crani Restrictions Weight Bearing Restrictions: No  Therapy/Group: Individual Therapy  Jessamy Torosyan K Ettamae Barkett 10/23/2018, 5:11 PM

## 2018-10-23 NOTE — Progress Notes (Signed)
Occupational Therapy Session Note  Patient Details  Name: Gregory Moreno MRN: [id number] Date of Birth: 07/02/1995  Today's Date: 10/23/2018 OT Individual Time: 1015-1115 OT Individual Time Calculation (min): 60 min    Short Term Goals: Week 1:  OT Short Term Goal 1 (Week 1): Pt will sit EOB/EOM with MOD A during functional dynamic sitting balance task OT Short Term Goal 2 (Week 1): Pt will sit to stand with MAX A of 1 in stedy OT Short Term Goal 3 (Week 1): Pt will initate washing face with VC only OT Short Term Goal 4 (Week 1): Pt will locate toothbrush with mod multimodal cues for improved visual scanning OT Short Term Goal 5 (Week 1): Pt will completes 1/4 steps of UB dressing  Skilled Therapeutic Interventions/Progress Updates:    1;1. Pt received in TIS with no attempts at verbal communication. Pt with overall improved command following and initiation this date requiring mod tactile/HOH A throughout dressing tasks. Pt completes seated bathing with min HOH A for initation of washing body parts. Total HOH A of RUE to wash LUE for NMR. Pt dons shirt with MAX A overall, however requires A with all 4 steps d/t tightness of shirt. OT crosses Les into figure 4 with MOD-max A and pt able to wash lower legs and thread pants with MAX A. Pt requires CGA for sitting balance in TIS during functional activities d/t L lean. Pt sit to stand with MAX A of 1 and +2 advances pants past hips. Pt completes 2 min dynavision light search for improving visual scanning and tracking initiation speed. With lower 2 quadrants indicating lights, pt requires 4 seconds to locate stimulus on L and 13.5 seconds average on R (min VC provided for R head turning). Exited session with pt seated in TIS, call light in reach and all needs met.  Therapy Documentation Precautions:  Precautions Precautions: Fall Precaution Comments: L crani Restrictions Weight Bearing Restrictions: No General:   Vital Signs:   Pain: Pain  Assessment Pain Scale: Faces Faces Pain Scale: Hurts a little bit Pain Intervention(s): Medication (See eMAR) ADL: ADL Grooming: Maximal assistance Where Assessed-Grooming: Sitting at sink, Wheelchair Upper Body Bathing: Maximal assistance Where Assessed-Upper Body Bathing: Sitting at sink, Wheelchair Lower Body Bathing: Maximal assistance Where Assessed-Lower Body Bathing: Sitting at sink, Wheelchair Upper Body Dressing: Maximal assistance Where Assessed-Upper Body Dressing: Sitting at sink, Wheelchair Lower Body Dressing: Maximal assistance Where Assessed-Lower Body Dressing: Sitting at sink, Wheelchair Toileting: Unable to assess Toilet Transfer: Dependent(+2) Toilet Transfer Method: (STEDY) Vision   Perception    Praxis   Exercises:   Other Treatments:     Therapy/Group: Individual Therapy   M  10/23/2018, 11:38 AM  

## 2018-10-23 NOTE — Progress Notes (Signed)
Speech Language Pathology Daily Session Note  Patient Details  Name: Gregory Moreno MRN: [id number] Date of Birth: [birth year]  Today's Date: 10/23/2018 SLP Individual Time: 0725-0825 SLP Individual Time Calculation (min): 60 min  Short Term Goals: Week 1: SLP Short Term Goal 1 (Week 1): Patient will consume current diet with minimal overt s/s of aspiration with overall Mod A verbal and visual cues for use of compensatory strategies. SLP Short Term Goal 2 (Week 1): Patient will vocalize on command in 25% of opportunities with Max multimodal cues. SLP Short Term Goal 3 (Week 1): Patient will establish some form of basic communication to indicate yes/no in regards to wants/needs with Max A multimodal cues. SLP Short Term Goal 4 (Week 1): Patient will follow 1-step commands in 50% of opportunities with Max multimodal cues. SLP Short Term Goal 5 (Week 1): Patient will demonstrate sustained attention to tasks for ~5 minutes with Max A multimodal cues for redirection. SLP Short Term Goal 6 (Week 1): Patient will initiate tasks in 50% of opportunitied with Max A multimodal cues.  Skilled Therapeutic Interventions: Skilled treatment session focused on speech and dysphagia goals. Upon arrival, patient was awake in bed. Patient followed basic 1-step commands in regards to washing his face and lifting his leg for clinician to donn sock with verbal and contextual cues. Patient transferred to the wheelchair with overall Max A. Patient demonstrated increased attempts to answer basic yes/no questions in regards to wants/needs with mouthing "yes" X 2 and utilizing head nods/shoulder shrug X 3 resulting in overall 25% of opportunities. Patient also responded to humor appropriately X 2 with a smile. Patient consumed Dys. 2 textures with thin liquids with Mod verbal and tactile cues for use of small sips and utilized multiple swallows with both solids/liquids with overt cough X 2, suspect due to poor awareness of  bolus. However, patient did demonstrate increased movement of his oral cavity while receiving and manipulating the bolus. Recommend patient continue current diet. Patient handed off to NT. Patient left upright in wheelchair. Continue with current plan of care.      Pain Pain Assessment Pain Scale: Faces Faces Pain Scale: Hurts a little bit Pain Intervention(s): Medication (See eMAR)  Therapy/Group: Individual Therapy  Dalia Jollie 10/23/2018, 10:34 AM

## 2018-10-23 NOTE — Progress Notes (Signed)
PHYSICAL MEDICINE & REHABILITATION PROGRESS NOTE  Subjective/Complaints: Patient seen sitting up in his chair working with therapy this morning.  No reported issues overnight.  Per therapies, patient with improved receptive and expressive communication, however, patient does not follow commands for me.  ROS: Unable to assess due to cognition.  Objective: Vital Signs: Blood pressure 112/69, pulse 79, temperature 98.6 F (37 C), temperature source Oral, resp. rate 16, height 5\' 7"  (1.702 m), weight 66.1 kg, SpO2 98 %. Vas [place] Lower Extremity Venous (dvt)  Result Date: 10/22/2018  Lower Venous Study Indications: Swelling.  Comparison Study: No prior study. Performing Technologist: Maudry Mayhew MHA, RDMS, RVT, RDCS  Examination Guidelines: A complete evaluation includes B-mode imaging, spectral Doppler, color Doppler, and power Doppler as needed of all accessible portions of each vessel. Bilateral testing is considered an integral part of a complete examination. Limited examinations for reoccurring indications may be performed as noted.  +---------+---------------+---------+-----------+----------+--------------+ RIGHT    CompressibilityPhasicitySpontaneityPropertiesThrombus Aging +---------+---------------+---------+-----------+----------+--------------+ CFV      Full           Yes      Yes                                 +---------+---------------+---------+-----------+----------+--------------+ SFJ      Full                                                        +---------+---------------+---------+-----------+----------+--------------+ FV Prox  Full                                                        +---------+---------------+---------+-----------+----------+--------------+ FV Mid   Full                                                        +---------+---------------+---------+-----------+----------+--------------+ FV DistalFull                                                         +---------+---------------+---------+-----------+----------+--------------+ PFV      Full                                                        +---------+---------------+---------+-----------+----------+--------------+ POP      Full           Yes      Yes                                 +---------+---------------+---------+-----------+----------+--------------+ PTV      Full                                                        +---------+---------------+---------+-----------+----------+--------------+  PERO     Full                                                        +---------+---------------+---------+-----------+----------+--------------+   +---------+---------------+---------+-----------+----------+--------------+ LEFT     CompressibilityPhasicitySpontaneityPropertiesThrombus Aging +---------+---------------+---------+-----------+----------+--------------+ CFV      Full           Yes      Yes                                 +---------+---------------+---------+-----------+----------+--------------+ SFJ      Full                                                        +---------+---------------+---------+-----------+----------+--------------+ FV Prox  Full                                                        +---------+---------------+---------+-----------+----------+--------------+ FV Mid   Full                                                        +---------+---------------+---------+-----------+----------+--------------+ FV DistalFull                                                        +---------+---------------+---------+-----------+----------+--------------+ PFV      Full                                                        +---------+---------------+---------+-----------+----------+--------------+ POP      Full           Yes      Yes                                  +---------+---------------+---------+-----------+----------+--------------+ PTV      Full                                                        +---------+---------------+---------+-----------+----------+--------------+ PERO     Full                                                        +---------+---------------+---------+-----------+----------+--------------+  Summary: Right: There is no evidence of deep vein thrombosis in the lower extremity. No cystic structure found in the popliteal fossa. Left: There is no evidence of deep vein thrombosis in the lower extremity. No cystic structure found in the popliteal fossa.  *See table(s) above for measurements and observations.    Preliminary    Recent Labs    10/21/18 1638 10/22/18 0501  WBC 17.2* 18.0*  HGB 13.5 13.1  HCT 40.3 38.1*  PLT 343 300   Recent Labs    10/21/18 1638 10/22/18 0501  NA  --  134*  K  --  4.7  CL  --  97*  CO2  --  23  GLUCOSE  --  126*  BUN  --  23*  CREATININE 1.13 0.89  CALCIUM  --  9.2    Physical Exam: BP 112/69 (BP Location: Left Arm)   Pulse 79   Temp 98.6 F (37 C) (Oral)   Resp 16   Ht 5\' 7"  (1.702 m)   Wt 66.1 kg   SpO2 98%   BMI 22.82 kg/m  Constitutional: No distress . Vital signs reviewed. HENT: Left facial edema, craniectomy site C/D/I. Eyes: Right eye open more than left eye.  No discharge. Cardiovascular: No JVD. Respiratory: Normal effort.  No stridor. GI: Non-distended. Skin: See above. Psych: Unable to assess due to cognition. Musc: No edema in extremities.  No tenderness in extremities. Neuro: Alert. Motor: Limited, not following commands, but appears to be moving left upper extremity freely.  MAS: 1/4 right upper extremity, improving  Assessment/Plan: 1. 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  Physiatrist is  providing close team supervision and 24 hour management of active medical problems listed below.  Physiatrist and rehab team continue to assess barriers to discharge/monitor patient progress toward functional and medical goals  Care Tool:  Bathing    Body parts bathed by patient: Right arm, Chest, Abdomen   Body parts bathed by helper: Left arm, Front perineal area, Buttocks, Right upper leg, Left upper leg, Right lower leg, Left lower leg, Face     Bathing assist Assist Level: Maximal Assistance - Patient 24 - 49%     Upper Body Dressing/Undressing Upper body dressing   What is the patient wearing?: Pull over shirt    Upper body assist Assist Level: Dependent - Patient 0%(Taking off shirt)    Lower Body Dressing/Undressing Lower body dressing      What is the patient wearing?: Pants     Lower body assist Assist for lower body dressing: Total Assistance - Patient < 25%     Toileting Toileting    Toileting assist Assist for toileting: Total Assistance - Patient < 25%     Transfers Chair/bed transfer  Transfers assist  Chair/bed transfer activity did not occur: Safety/medical concerns  Chair/bed transfer assist level: Dependent - mechanical lift(+2)     Locomotion Ambulation   Ambulation assist   Ambulation activity did not occur: Safety/medical concerns          Walk 10 feet activity   Assist  Walk 10 feet activity did not occur: Safety/medical concerns        Walk 50 feet activity   Assist Walk 50 feet with 2 turns activity did not occur: Safety/medical concerns         Walk 150 feet activity   Assist Walk 150 feet  activity did not occur: Safety/medical concerns         Walk 10 feet on uneven surface  activity   Assist Walk 10 feet on uneven surfaces activity did not occur: Safety/medical concerns         Wheelchair     Assist Will patient use wheelchair at discharge?: (TBD)   Wheelchair activity did not occur:  Safety/medical concerns         Wheelchair 50 feet with 2 turns activity    Assist    Wheelchair 50 feet with 2 turns activity did not occur: Safety/medical concerns       Wheelchair 150 feet activity     Assist Wheelchair 150 feet activity did not occur: Safety/medical concerns          Medical Problem List and Plan: 1.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   Pathology report remains pending  Continue CIR  Continue steroids for now (cerebral edema), plan to decrease tomorrow 2. Antithrombotics: -DVT/anticoagulation:Subcutaneous heparin initiated 10/16/2018 -antiplatelet therapy: N/A 3. Pain Management:Tylenol as needed 4. Mood:Provide emotional support -antipsychotic agents: N/A 5. Neuropsych: This patientis not of making decisions on hisown behalf. 6. Skin/Wound Care:Routine skin checks 7. Fluids/Electrolytes/Nutrition:Routine in and outs  8. Dysphagia. Dysphasia #2 thin liquids. Follow-up speech therapy  Advance diet as tolerated 9. 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 Constipation  Bowel meds as necessary 11. Spasticity  Will consider baclofen if necessary 12. Hiccups  Baclofen as needed  Improving 13.  Steroid-induced hyperglycemia  Continue to monitor, labs ordered for tomorrow 14.  Hyponatremia  Sodium 134 on 10/7, labs ordered for tomorrow  Continue to monitor 15.  Transaminitis  LFTs elevated on 10/7, labs ordered for tomorrow  Continue to monitor 16.  Leukocytosis-likely steroid-induced  WBCs 18.0 on 10/7  Afebrile  Continue to monitor   LOS: 2 days A FACE TO FACE EVALUATION WAS PERFORMED  Ankit Lorie Phenix 10/23/2018,  9:30 AM

## 2018-10-24 ENCOUNTER — Inpatient Hospital Stay (HOSPITAL_COMMUNITY): Payer: 59 | Admitting: Physical Therapy

## 2018-10-24 ENCOUNTER — Inpatient Hospital Stay (HOSPITAL_COMMUNITY): Payer: 59

## 2018-10-24 ENCOUNTER — Inpatient Hospital Stay (HOSPITAL_COMMUNITY): Payer: 59 | Admitting: Speech Pathology

## 2018-10-24 LAB — COMPREHENSIVE METABOLIC PANEL
ALT: 97 U/L — ABNORMAL HIGH (ref 0–44)
AST: 58 U/L — ABNORMAL HIGH (ref 15–41)
Albumin: 3 g/dL — ABNORMAL LOW (ref 3.5–5.0)
Alkaline Phosphatase: 44 U/L (ref 38–126)
Anion gap: 8 (ref 5–15)
BUN: 25 mg/dL — ABNORMAL HIGH (ref 6–20)
CO2: 26 mmol/L (ref 22–32)
Calcium: 8.7 mg/dL — ABNORMAL LOW (ref 8.9–10.3)
Chloride: 100 mmol/L (ref 98–111)
Creatinine, Ser: 0.83 mg/dL (ref 0.61–1.24)
GFR calc Af Amer: >60 mL/min (ref >60)
GFR calc non Af Amer: >60 mL/min (ref >60)
Glucose, Bld: 114 mg/dL — ABNORMAL HIGH (ref 70–99)
Potassium: 4.1 mmol/L (ref 3.5–5.1)
Sodium: 134 mmol/L — ABNORMAL LOW (ref 135–145)
Total Bilirubin: 0.8 mg/dL (ref 0.3–1.2)
Total Protein: 6.9 g/dL (ref 6.5–8.1)

## 2018-10-24 MED ORDER — DEXAMETHASONE 2 MG PO TABS
2.0000 mg | ORAL_TABLET | Freq: Two times a day (BID) | ORAL | Status: DC
  Administered 2018-10-24 – 2018-10-28 (×8): 2 mg via ORAL
  Filled 2018-10-24 (×8): qty 1

## 2018-10-24 MED ORDER — BACLOFEN 5 MG HALF TABLET
5.0000 mg | ORAL_TABLET | Freq: Two times a day (BID) | ORAL | Status: DC
  Administered 2018-10-24 – 2018-10-28 (×9): 5 mg via ORAL
  Filled 2018-10-24 (×9): qty 1

## 2018-10-24 NOTE — Care Management (Signed)
[facility] Individual Statement of Services  Patient Name:  Gregory Moreno  Date:  10/24/2018  Welcome to the [facility].  Our goal is to provide you with an individualized program based on your diagnosis and situation, designed to meet your specific needs.  With this comprehensive rehabilitation program, you will be expected to participate in at least 3 hours of rehabilitation therapies Monday-Friday, with modified therapy programming on the weekends.  Your rehabilitation program will include the following services:  Physical Therapy (PT), Occupational Therapy (OT), Speech Therapy (ST), 24 hour per day rehabilitation nursing, Therapeutic Recreaction (TR), Neuropsychology, Case Management (Social Worker), Rehabilitation Medicine, Nutrition Services and Pharmacy Services  Weekly team conferences will be held on Tuesdays to discuss your progress.  Your Social Worker will talk with you frequently to get your input and to update you on team discussions.  Team conferences with you and your family in attendance may also be held.  Expected length of stay: 3.5 - 5 weeks   Overall anticipated outcome: minimal/ moderate assistance  Depending on your progress and recovery, your program may change. Your Social Worker will coordinate services and will keep you informed of any changes. Your Social Worker's name and contact numbers are listed  below.  The following services may also be recommended but are not provided by the [facility] will be made to provide these services after discharge if needed.  Arrangements include referral to agencies that provide these services.  Your insurance has been verified to be:  [facility] Your primary doctor is:  Scientist, research (physical sciences)  Pertinent information will be shared with  your doctor and your insurance company.  Social Worker:  [place], [place] or ([phone]   Information discussed with and copy given to patient by: Lennart Pall, 10/24/2018, 10:50 AM

## 2018-10-24 NOTE — Plan of Care (Signed)
  Problem: Consults Goal: RH BRAIN INJURY PATIENT EDUCATION Description: Description: See Patient Education module for eduction specifics Outcome: Progressing   Problem: RH BOWEL ELIMINATION Goal: RH STG MANAGE BOWEL WITH ASSISTANCE Description: STG Manage Bowel with max Assistance. Outcome: Progressing Goal: RH STG MANAGE BOWEL W/MEDICATION W/ASSISTANCE Description: STG Manage Bowel with Medication with max Assistance. Outcome: Progressing   Problem: RH BLADDER ELIMINATION Goal: RH STG MANAGE BLADDER WITH ASSISTANCE Description: STG Manage Bladder With max Assistance Outcome: Progressing   Problem: RH SKIN INTEGRITY Goal: RH STG SKIN FREE OF INFECTION/BREAKDOWN Outcome: Progressing Goal: RH STG MAINTAIN SKIN INTEGRITY WITH ASSISTANCE Description: STG Maintain Skin Integrity With max Assistance. Outcome: Progressing Goal: RH STG ABLE TO PERFORM INCISION/WOUND CARE W/ASSISTANCE Description: STG Able To Perform Incision/Wound Care With max Assistance. Outcome: Progressing   Problem: RH SAFETY Goal: RH STG ADHERE TO SAFETY PRECAUTIONS W/ASSISTANCE/DEVICE Description: STG Adhere to Safety Precautions With max Assistance/Device. Outcome: Progressing   Problem: RH COGNITION-NURSING Goal: RH STG ANTICIPATES NEEDS/CALLS FOR ASSIST W/ASSIST/CUES Description: STG Anticipates Needs/Calls for Assist With max Assistance/Cues. Outcome: Progressing   Problem: RH PAIN MANAGEMENT Goal: RH STG PAIN MANAGED AT OR BELOW [name] PAIN GOAL Description: Less than 3 out of 10  Outcome: Progressing   Problem: RH KNOWLEDGE DEFICIT BRAIN INJURY Goal: RH STG INCREASE KNOWLEDGE OF SELF CARE AFTER BRAIN INJURY Outcome: Progressing Goal: RH STG INCREASE KNOWLEDGE OF DYSPHAGIA/FLUID INTAKE Outcome: Progressing   Problem: Consults Goal: Diabetes Guidelines if Diabetic/Glucose > 140 Description: If diabetic or lab glucose is > 140 mg/dl - Initiate Diabetes/Hyperglycemia Guidelines & Document  Interventions  Outcome: Progressing

## 2018-10-24 NOTE — Progress Notes (Signed)
Orthopedic Tech Progress Note Patient Details:  Gregory Moreno 08/15/1995 [id number]  Patient ID: [name], male   DOB: December 02, 1995, 23 y.o.   MRN: [id number]   [place] Pink 10/24/2018, 9:54 AMCalled [facility] for right resting hand splint and right Prafo boot.

## 2018-10-24 NOTE — Progress Notes (Signed)
Occupational Therapy Session Note  Patient Details  Name: Gregory Moreno MRN: [id number] Date of Birth: 13-Oct-1995  Today's Date: 10/24/2018 OT Individual Time: 1100-1200 OT Individual Time Calculation (min): 60 min    Short Term Goals: Week 1:  OT Short Term Goal 1 (Week 1): Pt will sit EOB/EOM with MOD A during functional dynamic sitting balance task OT Short Term Goal 2 (Week 1): Pt will sit to stand with MAX A of 1 in stedy OT Short Term Goal 3 (Week 1): Pt will initate washing face with VC only OT Short Term Goal 4 (Week 1): Pt will locate toothbrush with mod multimodal cues for improved visual scanning OT Short Term Goal 5 (Week 1): Pt will completes 1/4 steps of UB dressing  Skilled Therapeutic Interventions/Progress Updates:    1;1. Pt received in bed asleep but easily aroused. Pt rolls with mod-MAX A for OT to don pants total A. Pt completes supine>sitting with MAX and transfers via squat pivot EOB>TIS<>EOM with total A of 1 with VC for handplacement and manual facilitation of weight shifting. Pt completes face washing with max HOH A for initation and termination. Pt dons shirt with total A with similar delayed processing with sequencing commands. On EOM, pt cued to maintain midline by touching L shoulder to wall with anchor. Pt able to complete 2x with MIN A facilitation at pelvis, however pt has total LOB after ~20 sec d/t fatigue. With theraball facilitating anterior pelvic tilt, pt completes cervical flex/ext with mod facilitation by OT for strengthening of extensors and lengthening of flexors to improve head control. Pt reaches for bean bags above head level for encouraging weight shifting and cervical extension with max A for sitting balance. Exited session with pt seated in bed, exit alarm on and call light in reach  Therapy Documentation Precautions:  Precautions Precautions: Fall Precaution Comments: L crani Restrictions Weight Bearing Restrictions: No General:   Vital  Signs:   Pain:   ADL: ADL Grooming: Maximal assistance Where Assessed-Grooming: Sitting at sink, Wheelchair Upper Body Bathing: Maximal assistance Where Assessed-Upper Body Bathing: Sitting at sink, Wheelchair Lower Body Bathing: Maximal assistance Where Assessed-Lower Body Bathing: Sitting at sink, Wheelchair Upper Body Dressing: Maximal assistance Where Assessed-Upper Body Dressing: Sitting at sink, Wheelchair Lower Body Dressing: Maximal assistance Where Assessed-Lower Body Dressing: Sitting at sink, Wheelchair Toileting: Unable to assess Toilet Transfer: Dependent(+2) Armed forces technical officer Method: Charlaine Dalton) Vision   Perception    Praxis   Exercises:   Other Treatments:     Therapy/Group: Individual Therapy  Tonny Branch 10/24/2018, 12:22 PM

## 2018-10-24 NOTE — Progress Notes (Addendum)
Physical Therapy Session Note  Patient Details  Name: Gregory Moreno MRN: [id number] Date of Birth: 04/17/1995  Today's Date: 10/25/2018 PT Individual Time: 0952-1100 PT Individual Time Calculation (min): 68 min   Short Term Goals: Week 1:  PT Short Term Goal 1 (Week 1): Pt will consistently complete bed mobilty with max assist +1. PT Short Term Goal 2 (Week 1): Pt will transfer bed<>w/c with max assist +1. PT Short Term Goal 3 (Week 1): Pt will initiate sit>stand with max assist +1 & LRAD.  Skilled Therapeutic Interventions/Progress Updates:  Pt received asleep in bed but awakened with extra time (mother present in room). Pt initiates moving LLE to EOB with max cuing but ultimately requires total assist +2 for supine>sitting EOB as pt unable to use bed rail to initiate movement. Pt requires total assist +2 for squat pivot with multiple scoots bed>w/c and w/c<>mat table with pt requiring extra time to initiate movement and therapist placing LUE on w/c armrest. In gym, while sitting EOM, focused on sitting balance, attention to task, following 1 step commands, & scanning L<>R while reaching for bean bags. Pt instructed to toss them but continues to hold them, but is able to sit them to his L/R with cuing. Pt requires up to max/total assist for sitting balance and to return to midline after reaching for objects. Pt with posterior/R LOB demonstrating little awareness & no attempts to correct. Pt is able to scan L<>R and up/down with eyes to locate objects without assist but does require max cuing/assist to hold head upright, although it's not as flexed as it was when this PT saw him last. Pt also performed lateral pushups to L to focus on strengthening & midline orientation with pt participating in task. At parallel bars pt transfer sit<>stand x 2 attempts with +2 assist then +3 assist with 3rd person using sheet behind buttocks to promote anterior pelvis & upright posture with therapist blocking R  knee and providing total assist +2 for sit<>stand as little initiation noted from pt. At end of session pt left sitting in w/c with chair alarm donned, call bell in reach, & mother present to supervise.   Addendum on 10/25/18: At beginning of session pt consumed regular water with straw with max cuing for small sips but poor return demo & pt with coughing episode after consuming water.  Therapy Documentation Precautions:  Precautions Precautions: Fall Precaution Comments: L crani Restrictions Weight Bearing Restrictions: No  Pain: No behaviors demonstrating pain during session.   Therapy/Group: Individual Therapy  [name] 10/25/2018, 1:46 PM

## 2018-10-24 NOTE — Progress Notes (Signed)
[facility] PHYSICAL MEDICINE & REHABILITATION PROGRESS NOTE  Subjective/Complaints: Patient seen laying in bed this morning.  No reported issues overnight.  Does not interact.  Discussed tone with therapies.  ROS: Unable to assess due to cognition  Objective: Vital Signs: Blood pressure 127/80, pulse 92, temperature 98.2 F (36.8 C), temperature source Axillary, resp. rate 17, height 5\' 7"  (1.702 m), weight 66.1 kg, SpO2 99 %. Vas [place] Lower Extremity Venous (dvt)  Result Date: 10/23/2018  Lower Venous Study Indications: Swelling.  Comparison Study: No prior study. Performing Technologist: Maudry Mayhew MHA, RDMS, RVT, RDCS  Examination Guidelines: A complete evaluation includes B-mode imaging, spectral Doppler, color Doppler, and power Doppler as needed of all accessible portions of each vessel. Bilateral testing is considered an integral part of a complete examination. Limited examinations for reoccurring indications may be performed as noted.  +---------+---------------+---------+-----------+----------+--------------+ RIGHT    CompressibilityPhasicitySpontaneityPropertiesThrombus Aging +---------+---------------+---------+-----------+----------+--------------+ CFV      Full           Yes      Yes                                 +---------+---------------+---------+-----------+----------+--------------+ SFJ      Full                                                        +---------+---------------+---------+-----------+----------+--------------+ FV Prox  Full                                                        +---------+---------------+---------+-----------+----------+--------------+ FV Mid   Full                                                        +---------+---------------+---------+-----------+----------+--------------+ FV DistalFull                                                         +---------+---------------+---------+-----------+----------+--------------+ PFV      Full                                                        +---------+---------------+---------+-----------+----------+--------------+ POP      Full           Yes      Yes                                 +---------+---------------+---------+-----------+----------+--------------+ PTV      Full                                                        +---------+---------------+---------+-----------+----------+--------------+  PERO     Full                                                        +---------+---------------+---------+-----------+----------+--------------+   +---------+---------------+---------+-----------+----------+--------------+ LEFT     CompressibilityPhasicitySpontaneityPropertiesThrombus Aging +---------+---------------+---------+-----------+----------+--------------+ CFV      Full           Yes      Yes                                 +---------+---------------+---------+-----------+----------+--------------+ SFJ      Full                                                        +---------+---------------+---------+-----------+----------+--------------+ FV Prox  Full                                                        +---------+---------------+---------+-----------+----------+--------------+ FV Mid   Full                                                        +---------+---------------+---------+-----------+----------+--------------+ FV DistalFull                                                        +---------+---------------+---------+-----------+----------+--------------+ PFV      Full                                                        +---------+---------------+---------+-----------+----------+--------------+ POP      Full           Yes      Yes                                  +---------+---------------+---------+-----------+----------+--------------+ PTV      Full                                                        +---------+---------------+---------+-----------+----------+--------------+ PERO     Full                                                        +---------+---------------+---------+-----------+----------+--------------+  Summary: Right: There is no evidence of deep vein thrombosis in the lower extremity. No cystic structure found in the popliteal fossa. Left: There is no evidence of deep vein thrombosis in the lower extremity. No cystic structure found in the popliteal fossa.  *See table(s) above for measurements and observations. Electronically signed by Monica Martinez MD on 10/23/2018 at 5:04:15 PM.    Final    Recent Labs    10/21/18 1638 10/22/18 0501  WBC 17.2* 18.0*  HGB 13.5 13.1  HCT 40.3 38.1*  PLT 343 300   Recent Labs    10/22/18 0501 10/24/18 0649  NA 134* 134*  K 4.7 4.1  CL 97* 100  CO2 23 26  GLUCOSE 126* 114*  BUN 23* 25*  CREATININE 0.89 0.83  CALCIUM 9.2 8.7*    Physical Exam: BP 127/80 (BP Location: Right Arm)   Pulse 92   Temp 98.2 F (36.8 C) (Axillary)   Resp 17   Ht 5\' 7"  (1.702 m)   Wt 66.1 kg   SpO2 99%   BMI 22.82 kg/m  Constitutional: No distress . Vital signs reviewed. HENT: Left facial edema, craniectomy site C/D/high  Eyes: No discharge.  Left eye ptosis Cardiovascular: No JVD. Respiratory: Normal effort.  No stridor. GI: Non-distended. Skin: See above. Psych: Unable to assess due to cognition Musc: No edema in extremities.  No tenderness in extremities. Neuro: Alert Motor: Limited, not following commands, but previously seen moving left upper extremity freely.  Tone increasing on right side  Assessment/Plan: 1. 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  Physiatrist is providing close team supervision and 24 hour management of active medical problems listed below.  Physiatrist and rehab team continue to assess barriers to discharge/monitor patient progress toward functional and medical goals  Care Tool:  Bathing    Body parts bathed by patient: Right arm, Chest, Abdomen, Left upper leg, Right upper leg   Body parts bathed by helper: Left arm, Front perineal area, Buttocks, Right upper leg, Left upper leg, Right lower leg, Left lower leg, Face     Bathing assist Assist Level: Maximal Assistance - Patient 24 - 49%     Upper Body Dressing/Undressing Upper body dressing   What is the patient wearing?: Pull over shirt    Upper body assist Assist Level: Maximal Assistance - Patient 25 - 49%    Lower Body Dressing/Undressing Lower body dressing      What is the patient wearing?: Pants     Lower body assist Assist for lower body dressing: Maximal Assistance - Patient 25 - 49%     Toileting Toileting    Toileting assist Assist for toileting: 2 Helpers     Transfers Chair/bed transfer  Transfers assist  Chair/bed transfer activity did not occur: Safety/medical concerns  Chair/bed transfer assist level: Total Assistance - Patient < 25%     Locomotion Ambulation   Ambulation assist   Ambulation activity did not occur: Safety/medical concerns          Walk 10 feet activity   Assist  Walk 10 feet activity did not occur: Safety/medical concerns        Walk 50 feet activity   Assist Walk 50 feet with 2 turns activity did not occur: Safety/medical concerns         Walk 150 feet activity   Assist Walk 150 feet activity did  not occur: Safety/medical concerns         Walk 10 feet on uneven surface  activity   Assist Walk 10 feet on uneven surfaces activity did not occur: Safety/medical concerns         Wheelchair     Assist Will patient use wheelchair at  discharge?: (TBD)   Wheelchair activity did not occur: Safety/medical concerns         Wheelchair 50 feet with 2 turns activity    Assist    Wheelchair 50 feet with 2 turns activity did not occur: Safety/medical concerns       Wheelchair 150 feet activity     Assist Wheelchair 150 feet activity did not occur: Safety/medical concerns          Medical Problem List and Plan: 1.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   Pathology report remains pending on 10/9  Continue CIR  Document from decreased to 2 mg twice daily on 10/9 2. Antithrombotics: -DVT/anticoagulation:Subcutaneous heparin initiated 10/16/2018 -antiplatelet therapy: N/A 3. Pain Management:Tylenol as needed 4. Mood:Provide emotional support -antipsychotic agents: N/A 5. Neuropsych: This patientis not of making decisions on hisown behalf. 6. Skin/Wound Care:Routine skin checks 7. Fluids/Electrolytes/Nutrition:Routine in and outs  8. Dysphagia. Dysphasia #2 thin liquids. Follow-up speech therapy  Advance diet as tolerated 9. 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 Constipation  Bowel meds as necessary 11. Spasticity  Baclofen 5 2 times daily started on 10/9 12. Hiccups  Baclofen as needed  Improved 13.  Steroid-induced hyperglycemia  Continue to monitor with steroid wean 14.  Hyponatremia  Sodium 134 on 10/, labs ordered for Monday  Continue to monitor 15.  Transaminitis  LFTs remain elevated and ALT?  Trending up on 10/9  Labs ordered for Monday  Continue to monitor 16.  Leukocytosis-likely steroid-induced  WBCs 18.0 on 10/7, labs ordered for  Monday  Afebrile  Continue to monitor   LOS: 3 days A FACE TO FACE EVALUATION WAS PERFORMED  Ankit Lorie Phenix 10/24/2018, 9:17 AM

## 2018-10-24 NOTE — Progress Notes (Signed)
Speech Language Pathology Daily Session Note  Patient Details  Name: Gregory Moreno MRN: [id number] Date of Birth: [birth year]  Today's Date: 10/24/2018 SLP Individual Time: 0825-0925 SLP Individual Time Calculation (min): 60 min  Short Term Goals: Week 1: SLP Short Term Goal 1 (Week 1): Patient will consume current diet with minimal overt s/s of aspiration with overall Mod A verbal and visual cues for use of compensatory strategies. SLP Short Term Goal 2 (Week 1): Patient will vocalize on command in 25% of opportunities with Max multimodal cues. SLP Short Term Goal 3 (Week 1): Patient will establish some form of basic communication to indicate yes/no in regards to wants/needs with Max A multimodal cues. SLP Short Term Goal 4 (Week 1): Patient will follow 1-step commands in 50% of opportunities with Max multimodal cues. SLP Short Term Goal 5 (Week 1): Patient will demonstrate sustained attention to tasks for ~5 minutes with Max A multimodal cues for redirection. SLP Short Term Goal 6 (Week 1): Patient will initiate tasks in 50% of opportunitied with Max A multimodal cues.  Skilled Therapeutic Interventions: Skilled treatment session focused on speech and dysphagia goals. Upon arrival, patient was awake in bed. Patient followed basic 1-step commands in regards to initiating movement to EOB. Patient transferred to the wheelchair with overall Max A. Patient demonstrated increased attempts to answer basic yes/no questions in regards to wants/needs with utilizing hand gestures (thumbs up/down) in 10% of opportunities with overall Max A multimodal cues. Patient consumed Dys. 2 textures with thin liquids with Min verbal and tactile cues for use of small sips and utilized multiple swallows with both solids/liquids with overt cough X 1, suspect due large size of bolus. Patient self-fed his meal by bringing utensil to mouth after clinician scooped bolus with overall Min verbal and visual cues for initiation.  Recommend patient continue current diet. Patient left upright in wheelchair with alarm on and mother present. Continue with current plan of care.      Pain No/Denies Pain   Therapy/Group: Individual Therapy  [name] 10/24/2018, 12:39 PM

## 2018-10-25 ENCOUNTER — Inpatient Hospital Stay (HOSPITAL_COMMUNITY): Payer: 59 | Admitting: Speech Pathology

## 2018-10-25 ENCOUNTER — Inpatient Hospital Stay (HOSPITAL_COMMUNITY): Payer: 59

## 2018-10-25 ENCOUNTER — Inpatient Hospital Stay (HOSPITAL_COMMUNITY): Payer: 59 | Admitting: Physical Therapy

## 2018-10-25 NOTE — Plan of Care (Signed)
  Problem: Consults Goal: RH BRAIN INJURY PATIENT EDUCATION Description: Description: See Patient Education module for eduction specifics Outcome: Progressing   Problem: RH BOWEL ELIMINATION Goal: RH STG MANAGE BOWEL WITH ASSISTANCE Description: STG Manage Bowel with max Assistance. Outcome: Progressing Goal: RH STG MANAGE BOWEL W/MEDICATION W/ASSISTANCE Description: STG Manage Bowel with Medication with max Assistance. Outcome: Progressing   Problem: RH BLADDER ELIMINATION Goal: RH STG MANAGE BLADDER WITH ASSISTANCE Description: STG Manage Bladder With max Assistance Outcome: Progressing   Problem: RH SKIN INTEGRITY Goal: RH STG SKIN FREE OF INFECTION/BREAKDOWN Outcome: Progressing Goal: RH STG MAINTAIN SKIN INTEGRITY WITH ASSISTANCE Description: STG Maintain Skin Integrity With max Assistance. Outcome: Progressing Goal: RH STG ABLE TO PERFORM INCISION/WOUND CARE W/ASSISTANCE Description: STG Able To Perform Incision/Wound Care With max Assistance. Outcome: Progressing   Problem: RH SAFETY Goal: RH STG ADHERE TO SAFETY PRECAUTIONS W/ASSISTANCE/DEVICE Description: STG Adhere to Safety Precautions With max Assistance/Device. Outcome: Progressing   Problem: RH COGNITION-NURSING Goal: RH STG ANTICIPATES NEEDS/CALLS FOR ASSIST W/ASSIST/CUES Description: STG Anticipates Needs/Calls for Assist With max Assistance/Cues. Outcome: Progressing   Problem: RH PAIN MANAGEMENT Goal: RH STG PAIN MANAGED AT OR BELOW [name] PAIN GOAL Description: Less than 3 out of 10  Outcome: Progressing   Problem: RH KNOWLEDGE DEFICIT BRAIN INJURY Goal: RH STG INCREASE KNOWLEDGE OF SELF CARE AFTER BRAIN INJURY Outcome: Progressing Goal: RH STG INCREASE KNOWLEDGE OF DYSPHAGIA/FLUID INTAKE Outcome: Progressing   Problem: Consults Goal: Diabetes Guidelines if Diabetic/Glucose > 140 Description: If diabetic or lab glucose is > 140 mg/dl - Initiate Diabetes/Hyperglycemia Guidelines & Document  Interventions  Outcome: Progressing

## 2018-10-25 NOTE — Progress Notes (Signed)
[facility] PHYSICAL MEDICINE & REHABILITATION PROGRESS NOTE  Subjective/Complaints: No issues overnite per Dad Pt Globally aphasic  ROS: Unable to assess due to cognition  Objective: Vital Signs: Blood pressure 128/64, pulse 81, temperature 98.1 F (36.7 C), temperature source Oral, resp. rate 18, height 5\' 7"  (1.702 m), weight 66.1 kg, SpO2 98 %. No results found. No results for input(s): WBC, HGB, HCT, PLT in the last 72 hours. Recent Labs    10/24/18 0649  NA 134*  K 4.1  CL 100  CO2 26  GLUCOSE 114*  BUN 25*  CREATININE 0.83  CALCIUM 8.7*    Physical Exam: BP 128/64 (BP Location: Left Arm)   Pulse 81   Temp 98.1 F (36.7 C) (Oral)   Resp 18   Ht 5\' 7"  (1.702 m)   Wt 66.1 kg   SpO2 98%   BMI 22.82 kg/m  Constitutional: No distress . Vital signs reviewed. HENT: Left facial edema, craniectomy site C/D/high  Eyes: No discharge.  Left eye ptosis-mild, L pupil non reactive Cardiovascular: No JVD. Respiratory: Normal effort.  No stridor. GI: Non-distended. Skin: See above. Psych: Unable to assess due to cognition Musc: No edema in extremities.  No tenderness in extremities. Neuro: Alert Motor: Limited MMT, not following commands, intermittent spont movement LLE and LUE but not to commandAssessment/Plan: 1. 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  Physiatrist is providing close team supervision and 24 hour management of active medical problems listed below.  Physiatrist and rehab team continue to assess barriers to discharge/monitor patient progress toward functional and medical goals  Care Tool:  Bathing    Body parts bathed by patient: Right arm, Chest, Abdomen, Left upper leg, Right upper leg   Body parts bathed by helper: Left arm, Front perineal area, Buttocks, Right upper leg, Left upper leg, Right lower  leg, Left lower leg, Face     Bathing assist Assist Level: Maximal Assistance - Patient 24 - 49%     Upper Body Dressing/Undressing Upper body dressing   What is the patient wearing?: Pull over shirt    Upper body assist Assist Level: Maximal Assistance - Patient 25 - 49%    Lower Body Dressing/Undressing Lower body dressing      What is the patient wearing?: Incontinence brief     Lower body assist Assist for lower body dressing: 2 Helpers     Toileting Toileting    Toileting assist Assist for toileting: Dependent - Patient 0%     Transfers Chair/bed transfer  Transfers assist  Chair/bed transfer activity did not occur: Safety/medical concerns  Chair/bed transfer assist level: 2 Helpers     Locomotion Ambulation   Ambulation assist   Ambulation activity did not occur: Safety/medical concerns          Walk 10 feet activity   Assist  Walk 10 feet activity did not occur: Safety/medical concerns        Walk 50 feet activity   Assist Walk 50 feet with 2 turns activity did not occur: Safety/medical concerns         Walk 150 feet activity   Assist Walk 150 feet activity did not occur: Safety/medical concerns         Walk 10 feet on uneven surface  activity   Assist Walk 10 feet on uneven surfaces activity did not occur: Safety/medical concerns  Wheelchair     Assist Will patient use wheelchair at discharge?: (TBD)   Wheelchair activity did not occur: Safety/medical concerns         Wheelchair 50 feet with 2 turns activity    Assist    Wheelchair 50 feet with 2 turns activity did not occur: Safety/medical concerns       Wheelchair 150 feet activity     Assist Wheelchair 150 feet activity did not occur: Safety/medical concerns          Medical Problem List and Plan: 1.Right greater than left-sided weaknesssecondary to intracerebral hemorrhage with intracranial tumor.Surgery was done  emergently due to signs of herniation due to notes.Status post left craniotomy for hematoma evacuation and tumor resection 10/13/2018.   Pathology report remains pending on 10/10  Continue CIR PT, OT, SLP efforts   2. Antithrombotics: -DVT/anticoagulation:Subcutaneous heparin initiated 10/16/2018 -antiplatelet therapy: N/A 3. Pain Management:Tylenol as needed 4. Mood:Provide emotional support -antipsychotic agents: N/A 5. Neuropsych: This patientis not of making decisions on hisown behalf. 6. Skin/Wound Care:Routine skin checks 7. Fluids/Electrolytes/Nutrition:Routine in and outs  8. Dysphagia. Dysphasia #2 thin liquids. Follow-up speech therapy  Advance diet as tolerated 9. 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 Constipation  Bowel meds as necessary 11. Spasticity  Baclofen 5mg  2 times daily started on 10/9 12. Hiccups  Baclofen as needed  Improved 13.  Steroid-induced hyperglycemia  Continue to monitor with steroid wean 14.  Hyponatremia  Sodium 134 on 10/, labs ordered for Monday 10/12  Continue to monitor 15.  Transaminitis  LFTs remain elevated and ALT?  Trending up on 10/9  Labs ordered for Monday  Continue to monitor 16.  Leukocytosis-likely steroid-induced  WBCs 18.0 on 10/7, labs ordered for Monday  Afebrile  Continue to monitor   LOS: 4 days A FACE TO [place] E Kirsteins 10/25/2018, 11:20 AM

## 2018-10-25 NOTE — Progress Notes (Signed)
Occupational Therapy Session Note  Patient Details  Name: Gregory Moreno MRN: [id number] Date of Birth: 1995/04/08  Today's Date: 10/25/2018 OT Individual Time: 1330-1445 OT Individual Time Calculation (min): 75 min    Short Term Goals: Week 1:  OT Short Term Goal 1 (Week 1): Pt will sit EOB/EOM with MOD A during functional dynamic sitting balance task OT Short Term Goal 2 (Week 1): Pt will sit to stand with MAX A of 1 in stedy OT Short Term Goal 3 (Week 1): Pt will initate washing face with VC only OT Short Term Goal 4 (Week 1): Pt will locate toothbrush with mod multimodal cues for improved visual scanning OT Short Term Goal 5 (Week 1): Pt will completes 1/4 steps of UB dressing  Skilled Therapeutic Interventions/Progress Updates:    1;1. Pt and father present during session. Educated father on pressure relief, delayed processing, R inattention (I.e. when sitting sit on R side) and decreased initiation. Pt seated in w/c with no indication verbal or nonverbal to pain. Pt completes UB bathing with mod A for washing LUE and initiation of washing chest. Pt able to don shirt with MOD A completing last 2 steps of hemi dressing and initiating lean forward to pull shirt down back. Pt completes arm skate activity with RUE requiring total A to completes full ROM with trace to no activation with all movements. Pt requires mod VC for visual fixation on arm during exercise d/t R inattention. Pt completes dynavision 2 min activity with no Vc for R attention and pt locates lights on L with 5.0 seconds and R with 8.5 seconds (improvement from 13.5 2 days ago). Pt with wet brief and OT completes total A transfer back to bed via squat pivot. Pt able to iniate bringing LUE to R handrail and bend L knee however requires MAX A for rolling this date while OT ocmpletes peri care and changes clothing. Exited session with pt seated in bed, exit alamr on and call light inreach  Therapy Documentation Precautions:   Precautions Precautions: Fall Precaution Comments: L crani Restrictions Weight Bearing Restrictions: No General:   Vital Signs:   Pain:   ADL: ADL Grooming: Maximal assistance Where Assessed-Grooming: Sitting at sink, Wheelchair Upper Body Bathing: Maximal assistance Where Assessed-Upper Body Bathing: Sitting at sink, Wheelchair Lower Body Bathing: Maximal assistance Where Assessed-Lower Body Bathing: Sitting at sink, Wheelchair Upper Body Dressing: Maximal assistance Where Assessed-Upper Body Dressing: Sitting at sink, Wheelchair Lower Body Dressing: Maximal assistance Where Assessed-Lower Body Dressing: Sitting at sink, Wheelchair Toileting: Unable to assess Toilet Transfer: Dependent(+2) Armed forces technical officer Method: Charlaine Dalton) Vision   Perception    Praxis   Exercises:   Other Treatments:     Therapy/Group: Individual Therapy  Tonny Branch 10/25/2018, 2:45 PM

## 2018-10-25 NOTE — Progress Notes (Signed)
Physical Therapy Session Note  Patient Details  Name: Gregory Moreno MRN: [id number] Date of Birth: [birth year]  Today's Date: 10/25/2018 PT Individual Time: 0952-1100 PT Individual Time Calculation (min): 68 min   Short Term Goals: Week 1:  PT Short Term Goal 1 (Week 1): Pt will consistently complete bed mobilty with max assist +1. PT Short Term Goal 2 (Week 1): Pt will transfer bed<>w/c with max assist +1. PT Short Term Goal 3 (Week 1): Pt will initiate sit>stand with max assist +1 & LRAD.  Skilled Therapeutic Interventions/Progress Updates:  Pt received awake in bed with father Biter) present in room. Pt found to be incontinent of bladder & smear of BM so therapist performed total assist peri hygiene and donning clean brief. Therapist also threaded shorts on BLE & pt attempts to pull up L side but ultimately requires total assist for clothing management. Therapist provides dependent assist for donning socks & tennis shoes for time management. Pt rolls L<>R for all tasks previously noted with total assist with therapist placing UE on bed rails and pt with little initiation to assist with rolling. Positioned UE & LE to assist with rolling R & sidelying>supine & pt only assisting with bringing LLE off of bed, otherwise pt requires total assist +2 for sideying>sitting & total assist +2 for squat pivot to w/c on L with multiple scoots. Transported pt to ortho gym via w/c dependent assist for time management. Pt stands in standing frame ~5 minutes + ~7 minutes with pt demonstrating significant weight shift to L with pelvis but R lateral lean with trunk with therapist attempting to correct. Pt maintains B hip flexion and does not attempt to shift pelvis anteriorly or reduce trunk flexion for more upright posture. While in standing frame, had pt engage in reaching for bean bags with pt initiating all attempts but requires max cuing to complete task and significantly extra time to do so.  At end of  session pt left in TIS w/c with chair alarm donned & father present in room to supervise. Reviewed with pt's father how to tilt pt in w/c for pressure relief with instruction to perform this approximately every hour while pt is up in the w/c.  Pt appears to be somewhat resistive to movements during session.  Pt alert, with B eyes open (R more than L) during session.  Therapy Documentation Precautions:  Precautions Precautions: Fall Precaution Comments: L crani Restrictions Weight Bearing Restrictions: No  Pain: No behaviors demonstrating pain during session.   Therapy/Group: Individual Therapy  [name] 10/25/2018, 11:02 AM

## 2018-10-25 NOTE — Progress Notes (Signed)
Speech Language Pathology Daily Session Note  Patient Details  Name: Gregory Moreno MRN: [id number] Date of Birth: Aug 19, 1995  Today's Date: 10/25/2018 SLP Individual Time: [id number] SLP Individual Time Calculation (min): 30 min  Short Term Goals: Week 1: SLP Short Term Goal 1 (Week 1): Patient will consume current diet with minimal overt s/s of aspiration with overall Mod A verbal and visual cues for use of compensatory strategies. SLP Short Term Goal 2 (Week 1): Patient will vocalize on command in 25% of opportunities with Max multimodal cues. SLP Short Term Goal 3 (Week 1): Patient will establish some form of basic communication to indicate yes/no in regards to wants/needs with Max A multimodal cues. SLP Short Term Goal 4 (Week 1): Patient will follow 1-step commands in 50% of opportunities with Max multimodal cues. SLP Short Term Goal 5 (Week 1): Patient will demonstrate sustained attention to tasks for ~5 minutes with Max A multimodal cues for redirection. SLP Short Term Goal 6 (Week 1): Patient will initiate tasks in 50% of opportunitied with Max A multimodal cues.  Skilled Therapeutic Interventions:  Skilled treatment session focused on dysphagia and communication goals. When SLP entered room, pt reached out and took my offered hand for hand shake. Although pt didn't use thumbs up/down to communicate he moved his hand briefly x 2 in response to questions. When presented with milk carton, pt reached for it and held it during meal. Pt able to bring to mouth when given verbal cues and gentle tactile cues to start movement. Once at lips, SLP provided physical support to ensure single sips. SLP also provided skilled observation of pt consuming dysphagia 2 breakfast. [name] lingual sweeps were observed at cheek throughout meal with no obvious pocketing observed when pt opened mouth to receive next bite. Pt with sustained attention to eating for ~ 20 minutes. At end of session, pt shook SLP's hand  when offered for hand shake. Pt left upright in bed, bed alarm on and all needs within reach. Continue per current plan of care.      Pain Pain Assessment Pain Scale: Faces Pain Score: 0-No pain  Therapy/Group: Individual Therapy  Shubh Chiara 10/25/2018, 10:04 AM

## 2018-10-26 NOTE — Plan of Care (Signed)
  Problem: Consults Goal: RH BRAIN INJURY PATIENT EDUCATION Description: Description: See Patient Education module for eduction specifics Outcome: Progressing   Problem: RH BOWEL ELIMINATION Goal: RH STG MANAGE BOWEL WITH ASSISTANCE Description: STG Manage Bowel with max Assistance. Outcome: Progressing Goal: RH STG MANAGE BOWEL W/MEDICATION W/ASSISTANCE Description: STG Manage Bowel with Medication with max Assistance. Outcome: Progressing   Problem: RH BLADDER ELIMINATION Goal: RH STG MANAGE BLADDER WITH ASSISTANCE Description: STG Manage Bladder With max Assistance Outcome: Progressing   Problem: RH SKIN INTEGRITY Goal: RH STG SKIN FREE OF INFECTION/BREAKDOWN Outcome: Progressing Goal: RH STG MAINTAIN SKIN INTEGRITY WITH ASSISTANCE Description: STG Maintain Skin Integrity With max Assistance. Outcome: Progressing Goal: RH STG ABLE TO PERFORM INCISION/WOUND CARE W/ASSISTANCE Description: STG Able To Perform Incision/Wound Care With max Assistance. Outcome: Progressing   Problem: RH SAFETY Goal: RH STG ADHERE TO SAFETY PRECAUTIONS W/ASSISTANCE/DEVICE Description: STG Adhere to Safety Precautions With max Assistance/Device. Outcome: Progressing   Problem: RH COGNITION-NURSING Goal: RH STG ANTICIPATES NEEDS/CALLS FOR ASSIST W/ASSIST/CUES Description: STG Anticipates Needs/Calls for Assist With max Assistance/Cues. Outcome: Progressing   Problem: RH PAIN MANAGEMENT Goal: RH STG PAIN MANAGED AT OR BELOW [name] PAIN GOAL Description: Less than 3 out of 10  Outcome: Progressing   Problem: RH KNOWLEDGE DEFICIT BRAIN INJURY Goal: RH STG INCREASE KNOWLEDGE OF SELF CARE AFTER BRAIN INJURY Outcome: Progressing Goal: RH STG INCREASE KNOWLEDGE OF DYSPHAGIA/FLUID INTAKE Outcome: Progressing   Problem: Consults Goal: Diabetes Guidelines if Diabetic/Glucose > 140 Description: If diabetic or lab glucose is > 140 mg/dl - Initiate Diabetes/Hyperglycemia Guidelines & Document  Interventions  Outcome: Progressing

## 2018-10-26 NOTE — Progress Notes (Signed)
[facility] PHYSICAL MEDICINE & REHABILITATION PROGRESS NOTE  Subjective/Complaints: No issues overnite per Mom, appetite good,  Pt Globally aphasic  ROS: Unable to assess due to cognition  Objective: Vital Signs: Blood pressure 128/73, pulse 95, temperature 98.7 F (37.1 C), temperature source Oral, resp. rate 14, height 5\' 7"  (1.702 m), weight 66.1 kg, SpO2 99 %. No results found. No results for input(s): WBC, HGB, HCT, PLT in the last 72 hours. Recent Labs    10/24/18 0649  NA 134*  K 4.1  CL 100  CO2 26  GLUCOSE 114*  BUN 25*  CREATININE 0.83  CALCIUM 8.7*    Physical Exam: BP 128/73 (BP Location: Right Arm)   Pulse 95   Temp 98.7 F (37.1 C) (Oral)   Resp 14   Ht 5\' 7"  (1.702 m)   Wt 66.1 kg   SpO2 99%   BMI 22.82 kg/m  Constitutional: No distress . Vital signs reviewed. HENT: Left facial edema, craniectomy site C/D/high  Eyes: No discharge.  Left eye ptosis-mild, L pupil non reactive Cardiovascular: No JVD. Respiratory: Normal effort.  No stridor.lungs clear GI: Non-distended. Skin: See above. Psych: Unable to assess due to cognition Musc: No edema in extremities.  No tenderness in extremities. Neuro: Alert Motor: Limited MMT, not following commands, intermittent spont movement LLE and LUE but not to commandAssessment/Plan: 1. 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  Physiatrist is providing close team supervision and 24 hour management of active medical problems listed below.  Physiatrist and rehab team continue to assess barriers to discharge/monitor patient progress toward functional and medical goals  Care Tool:  Bathing    Body parts bathed by patient: Right arm, Chest, Abdomen, Left upper leg, Right upper leg   Body parts bathed by helper: Left arm, Front perineal area, Buttocks, Right upper leg,  Left upper leg, Right lower leg, Left lower leg, Face     Bathing assist Assist Level: Maximal Assistance - Patient 24 - 49%     Upper Body Dressing/Undressing Upper body dressing   What is the patient wearing?: Pull over shirt    Upper body assist Assist Level: Maximal Assistance - Patient 25 - 49%    Lower Body Dressing/Undressing Lower body dressing      What is the patient wearing?: Pants, Incontinence brief     Lower body assist Assist for lower body dressing: Total Assistance - Patient < 25%     Toileting Toileting    Toileting assist Assist for toileting: Dependent - Patient 0%     Transfers Chair/bed transfer  Transfers assist  Chair/bed transfer activity did not occur: Safety/medical concerns  Chair/bed transfer assist level: 2 Helpers     Locomotion Ambulation   Ambulation assist   Ambulation activity did not occur: Safety/medical concerns          Walk 10 feet activity   Assist  Walk 10 feet activity did not occur: Safety/medical concerns        Walk 50 feet activity   Assist Walk 50 feet with 2 turns activity did not occur: Safety/medical concerns         Walk 150 feet activity   Assist Walk 150 feet activity did not occur: Safety/medical concerns         Walk 10 feet on uneven surface  activity   Assist Walk 10 feet on uneven surfaces activity did  not occur: Safety/medical concerns         Wheelchair     Assist Will patient use wheelchair at discharge?: (TBD)   Wheelchair activity did not occur: Safety/medical concerns         Wheelchair 50 feet with 2 turns activity    Assist    Wheelchair 50 feet with 2 turns activity did not occur: Safety/medical concerns       Wheelchair 150 feet activity     Assist Wheelchair 150 feet activity did not occur: Safety/medical concerns          Medical Problem List and Plan: 1.Right greater than left-sided weaknesssecondary to intracerebral  hemorrhage with intracranial tumor.Surgery was done emergently due to signs of herniation due to notes.Status post left craniotomy for hematoma evacuation and tumor resection 10/13/2018.   Pathology report remains pending on 10/11  Continue CIR PT, OT, SLP    2. Antithrombotics: -DVT/anticoagulation:Subcutaneous heparin initiated 10/16/2018 -antiplatelet therapy: N/A 3. Pain Management:Tylenol as needed 4. Mood:Provide emotional support -antipsychotic agents: N/A 5. Neuropsych: This patientis not of making decisions on hisown behalf. 6. Skin/Wound Care:Routine skin checks 7. Fluids/Electrolytes/Nutrition:Routine in and outs  8. Dysphagia. Dysphasia #2 thin liquids. Follow-up speech therapy  Advance diet as tolerated 9. 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 Constipation  Bowel meds as necessary 11. Spasticity  Baclofen 5mg  2 times daily started on 10/9 12. Hiccups  Baclofen as needed  Improved 13.  Steroid-induced hyperglycemia  Continue to monitor with steroid wean 14.  Hyponatremia  Sodium 134 on 10/, labs ordered for Monday 10/12  Continue to monitor 15.  Transaminitis  LFTs remain elevated and ALT?  Trending up on 10/9  Labs ordered for Monday  Continue to monitor 16.  Leukocytosis-likely steroid-induced  WBCs 18.0 on 10/7, labs ordered for Monday  Afebrile  Continue to monitor   LOS: 5 days A FACE TO [place] E Jody Aguinaga 10/26/2018, 11:18 AM

## 2018-10-26 NOTE — Progress Notes (Signed)
Incontinent of urine, voids very large amounts, doesn't call when wet. Excoriated area to scrotum. ? Use condom cath at [facility]. Decreased initiation. Aphasic. Patrici Ranks A

## 2018-10-27 ENCOUNTER — Inpatient Hospital Stay (HOSPITAL_COMMUNITY): Payer: 59 | Admitting: Speech Pathology

## 2018-10-27 ENCOUNTER — Inpatient Hospital Stay (HOSPITAL_COMMUNITY): Payer: 59 | Admitting: Physical Therapy

## 2018-10-27 ENCOUNTER — Inpatient Hospital Stay (HOSPITAL_COMMUNITY): Payer: 59

## 2018-10-27 LAB — CBC WITH DIFFERENTIAL/PLATELET
Abs Immature Granulocytes: 0.7 10*3/uL — ABNORMAL HIGH (ref 0.00–0.07)
Basophils Absolute: 0.2 10*3/uL — ABNORMAL HIGH (ref 0.0–0.1)
Basophils Relative: 1 %
Eosinophils Absolute: 0.2 10*3/uL (ref 0.0–0.5)
Eosinophils Relative: 1 %
HCT: 34.6 % — ABNORMAL LOW (ref 39.0–52.0)
Hemoglobin: 11.9 g/dL — ABNORMAL LOW (ref 13.0–17.0)
Lymphocytes Relative: 10 %
Lymphs Abs: 1.8 10*3/uL (ref 0.7–4.0)
MCH: 30.3 pg (ref 26.0–34.0)
MCHC: 34.4 g/dL (ref 30.0–36.0)
MCV: 88 fL (ref 80.0–100.0)
Monocytes Absolute: 0.5 10*3/uL (ref 0.1–1.0)
Monocytes Relative: 3 %
Myelocytes: 4 %
Neutro Abs: 14.6 10*3/uL — ABNORMAL HIGH (ref 1.7–7.7)
Neutrophils Relative %: 81 %
Platelets: 255 10*3/uL (ref 150–400)
RBC: 3.93 MIL/uL — ABNORMAL LOW (ref 4.22–5.81)
RDW: 13.5 % (ref 11.5–15.5)
WBC: 18 10*3/uL — ABNORMAL HIGH (ref 4.0–10.5)
nRBC: 0 /100 WBC
nRBC: 0.1 % (ref 0.0–0.2)

## 2018-10-27 LAB — COMPREHENSIVE METABOLIC PANEL
ALT: 91 U/L — ABNORMAL HIGH (ref 0–44)
AST: 46 U/L — ABNORMAL HIGH (ref 15–41)
Albumin: 3.4 g/dL — ABNORMAL LOW (ref 3.5–5.0)
Alkaline Phosphatase: 56 U/L (ref 38–126)
Anion gap: 11 (ref 5–15)
BUN: 23 mg/dL — ABNORMAL HIGH (ref 6–20)
CO2: 25 mmol/L (ref 22–32)
Calcium: 9.2 mg/dL (ref 8.9–10.3)
Chloride: 97 mmol/L — ABNORMAL LOW (ref 98–111)
Creatinine, Ser: 0.81 mg/dL (ref 0.61–1.24)
GFR calc Af Amer: >60 mL/min (ref >60)
GFR calc non Af Amer: >60 mL/min (ref >60)
Glucose, Bld: 100 mg/dL — ABNORMAL HIGH (ref 70–99)
Potassium: 4.3 mmol/L (ref 3.5–5.1)
Sodium: 133 mmol/L — ABNORMAL LOW (ref 135–145)
Total Bilirubin: 0.7 mg/dL (ref 0.3–1.2)
Total Protein: 7 g/dL (ref 6.5–8.1)

## 2018-10-27 MED ORDER — METHYLPHENIDATE HCL 5 MG PO TABS
5.0000 mg | ORAL_TABLET | Freq: Two times a day (BID) | ORAL | Status: DC
  Administered 2018-10-27 – 2018-10-28 (×2): 5 mg via ORAL
  Filled 2018-10-27 (×2): qty 1

## 2018-10-27 NOTE — Progress Notes (Signed)
[place] PHYSICAL MEDICINE & REHABILITATION PROGRESS NOTE  Subjective/Complaints: Had a nose bleed this morning. Eating breakfast with NT and no apparent issues when I visited.   ROS: limited due to language/communication   Objective: Vital Signs: Blood pressure 136/74, pulse 100, temperature 98.1 F (36.7 C), resp. rate 18, height 5\' 7"  (1.702 m), weight 66.1 kg, SpO2 96 %. No results found. No results for input(s): WBC, HGB, HCT, PLT in the last 72 hours. Recent Labs    10/27/18 0659  NA 133*  K 4.3  CL 97*  CO2 25  GLUCOSE 100*  BUN 23*  CREATININE 0.81  CALCIUM 9.2    Physical Exam: BP 136/74 (BP Location: Left Arm)   Pulse 100   Temp 98.1 F (36.7 C)   Resp 18   Ht 5\' 7"  (1.702 m)   Wt 66.1 kg   SpO2 96%   BMI 22.82 kg/m  Constitutional: No distress . Vital signs reviewed. HEENT: EOMI, oral membranes moist, OS ptosis, pupil NR Neck: supple Cardiovascular: RRR without murmur. No JVD    Respiratory: CTA Bilaterally without wheezes or rales. Normal effort    GI: BS +, non-tender, non-distended  Skin: crani site intact. Psych: flat Musc: No edema in extremities.  No tenderness in extremities. Neuro: Alert Motor: Limited MMT d/t language. Sensed pain L>R, not following commands, intermittent spont movement LLE and LUE ongoing.   Assessment/Plan: 1. 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  Physiatrist is providing close team supervision and 24 hour management of active medical problems listed below.  Physiatrist and rehab team continue to assess barriers to discharge/monitor patient progress toward functional and medical goals  Care Tool:  Bathing    Body parts bathed by patient: Right arm, Chest, Abdomen, Left upper leg, Right upper leg   Body parts bathed by helper: Left arm, Front perineal area,  Buttocks, Right upper leg, Left upper leg, Right lower leg, Left lower leg, Face     Bathing assist Assist Level: Maximal Assistance - Patient 24 - 49%     Upper Body Dressing/Undressing Upper body dressing   What is the patient wearing?: Pull over shirt    Upper body assist Assist Level: Maximal Assistance - Patient 25 - 49%    Lower Body Dressing/Undressing Lower body dressing      What is the patient wearing?: Pants, Incontinence brief     Lower body assist Assist for lower body dressing: Total Assistance - Patient < 25%     Toileting Toileting    Toileting assist Assist for toileting: Dependent - Patient 0%     Transfers Chair/bed transfer  Transfers assist  Chair/bed transfer activity did not occur: Safety/medical concerns  Chair/bed transfer assist level: 2 Helpers     Locomotion Ambulation   Ambulation assist   Ambulation activity did not occur: Safety/medical concerns          Walk 10 feet activity   Assist  Walk 10 feet activity did not occur: Safety/medical concerns        Walk 50 feet activity   Assist Walk 50 feet with 2 turns activity did not occur: Safety/medical concerns         Walk 150 feet activity   Assist Walk 150 feet activity did not occur: Safety/medical concerns         Walk 10 feet on uneven surface  activity  Assist Walk 10 feet on uneven surfaces activity did not occur: Safety/medical concerns         Wheelchair     Assist Will patient use wheelchair at discharge?: (TBD)   Wheelchair activity did not occur: Safety/medical concerns         Wheelchair 50 feet with 2 turns activity    Assist    Wheelchair 50 feet with 2 turns activity did not occur: Safety/medical concerns       Wheelchair 150 feet activity     Assist Wheelchair 150 feet activity did not occur: Safety/medical concerns          Medical Problem List and Plan: 1.Right greater than left-sided  weaknesssecondary to intracerebral hemorrhage with intracranial tumor.Surgery was done emergently due to signs of herniation due to notes.Status post left craniotomy for hematoma evacuation and tumor resection 10/13/2018.   Pathology report remains pending on 10/12  Continue CIR PT, OT, SLP    2. Antithrombotics: -DVT/anticoagulation:Subcutaneous heparin initiated 10/16/2018 -antiplatelet therapy: N/A 3. Pain Management:Tylenol as needed 4. Mood:Provide emotional support -antipsychotic agents: N/A 5. Neuropsych: This patientis not of making decisions on hisown behalf. 6. Skin/Wound Care:Routine skin checks 7. Fluids/Electrolytes/Nutrition:Routine in and outs   -BUN/Cr sl elevated, but stable to improved 8. Dysphagia. Dysphasia #2 thin liquids. Follow-up speech therapy  Advance diet as tolerated 9. 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 Still very slow to initiate. Increase to 5mg  bid 10. Constipation  Bowel meds as necessary 11. Spasticity  Baclofen 5mg  2 times daily started on 10/9 12. Hiccups  Baclofen as needed  Improved 13.  Steroid-induced hyperglycemia  Continue to monitor with steroid wean 14.  Hyponatremia  Sodium 133, stable 10/12  Continue to monitor 15.  Transaminitis  LFTs remain elevated--but stable to improved 10/12 16.  Leukocytosis-likely steroid-induced  WBCs 18.0 on 10/7, labs pending 10/12  Afebrile      LOS: 6 days A FACE TO [place] 10/27/2018, 9:37 AM

## 2018-10-27 NOTE — Progress Notes (Signed)
At [place], observed nose bleed. Cold compress and pressure applied X 27mins. Spoke with Linna Hoff, PA to make aware of bleed and held AM SQ heparin. At 0720, nose had stopped bleeding. Gregory Moreno A

## 2018-10-27 NOTE — Progress Notes (Signed)
Physical Therapy Session Note  Patient Details  Name: Gregory Moreno MRN: [id number] Date of Birth: 1995-03-13  Today's Date: 10/27/2018 PT Individual Time: 1420-1530 PT Individual Time Calculation (min): 70 min   Short Term Goals: Week 1:  PT Short Term Goal 1 (Week 1): Pt will consistently complete bed mobilty with max assist +1. PT Short Term Goal 2 (Week 1): Pt will transfer bed<>w/c with max assist +1. PT Short Term Goal 3 (Week 1): Pt will initiate sit>stand with max assist +1 & LRAD.  Skilled Therapeutic Interventions/Progress Updates:  Pt received in w/c with father present in room. Transported pt to/from gym via w/c dependent assist for time management. Initiated slide board to mat table when pt noted to be incontinent of urine so returned to room. Session focused on slide board transfers w/c<>bed, w/c<>mat table. Pt requires dependent assist for slide board placement & +2 total assist for slide board transfer as pt able to intermittently hold to armrest but does not initiate pulling/scooting across board. Pt requires total assist for sit>supine and +2 for supine>sit and rolling L<>R to allow therapist to perform peri hygiene and don clean brief. Provided cuing & placement for UE placement on bed rail and LE positioning but pt does not initiate pulling to assist with rolling. When rolling L therapist provides total assist for turning head to L to assist with rolling as pt stops turning head at midline. Therapist provides total assist for donning clean shorts & shirt. Once back in gym, pt transfers to sitting EOM. Pt with absent awareness regarding LOB and absent righting reactions despite multimodal cuing. While sitting EOM therapist provides manual facilitation for holding head upright as pt continues to sit with neck flexed forward. Pt engages in kicking ball with LLE & catching ball with LUE with MAX cuing to initiate then pt able to carry out movement. Pt unable to initiate throwing  ball with LUE. At end of session pt left sitting in w/c with chair alarm donned, all needs in reach, father present in room to supervise.  Therapy Documentation Precautions:  Precautions Precautions: Fall Precaution Comments: L crani Restrictions Weight Bearing Restrictions: No  Pain: No behaviors demonstrating pain during session.   Therapy/Group: Individual Therapy  [name] 10/27/2018, 3:52 PM

## 2018-10-27 NOTE — Progress Notes (Signed)
Incontinent of urine at [facility], condom cath applied. +/- sleep. Right resting hand splint and right PRAFO applied. Non verbal. Cornell Barman

## 2018-10-27 NOTE — Progress Notes (Signed)
Occupational Therapy Session Note  Patient Details  Name: Gregory Moreno MRN: [id number] Date of Birth: 1995-12-27  Today's Date: 10/27/2018 OT Individual Time: 4098-1191 OT Individual Time Calculation (min): 54 min    Session 2:  OT Individual Time: 1130-1210 OT Individual Time Calculation (min): 45 min    Short Term Goals: Week 1:  OT Short Term Goal 1 (Week 1): Pt will sit EOB/EOM with MOD A during functional dynamic sitting balance task OT Short Term Goal 2 (Week 1): Pt will sit to stand with MAX A of 1 in stedy OT Short Term Goal 3 (Week 1): Pt will initate washing face with VC only OT Short Term Goal 4 (Week 1): Pt will locate toothbrush with mod multimodal cues for improved visual scanning OT Short Term Goal 5 (Week 1): Pt will completes 1/4 steps of UB dressing  Skilled Therapeutic Interventions/Progress Updates:    Session 1: Pt received supine with no indications of pain. Pt non-verbal throughout session, but was tracking therapist throughout the room. Pt rolled R and L throughout session with max-total A, requiring facilitation to reach across body to grab onto rail with LUE. Pt completed sidelying to sitting EOB with total A. Total A to doff shirt. With RUE placed in weightbearing pt was able to maintain sitting balance with min A, compared to usual max A. Pt with forward flexed head throughout session. Pt completed UB bathing with max A overall, with pt making efforts to wash very top of chest and face only. Pt donned shirt with mod A! He was able to thread LUE and head into shirt. Pt was assisted back to supine for LB bathing. Pt had incontinent small BM, requiring total A to clean and don new brief. Pt pulled very large clot out of his nose and RN was alerted. Pt was left supine with all needs met, bed alarm set.    Session 2: Pt received supine with no indications of pain. With +2 assistance using the 3 musketeers technique, pt completed sit <> stand and several steps to  the TIS w/c with total A. Total management of RLE required, with pt making efforts to move LLE in standing. In the chair pt completed oral care at the sink with max A, HOH provided. Pt also completed shaving task with an electric razor, requiring HOH max A. [name] father present throughout session and had multiple questions that were all answered re CLOF, future expectations, and stroke recovery. Demonstrated PROM to pt's BUE. Pt was left sitting up in the w/c with the chair alarm belt fastened and all needs met.   Therapy Documentation Precautions:  Precautions Precautions: Fall Precaution Comments: L crani Restrictions Weight Bearing Restrictions: No   Therapy/Group: Individual Therapy  [name] 10/27/2018, 7:11 AM

## 2018-10-27 NOTE — Progress Notes (Signed)
Speech Language Pathology Daily Session Note  Patient Details  Name: Gregory Moreno MRN: [id number] Date of Birth: Sep 05, 1995  Today's Date: 10/27/2018 SLP Individual Time: 0915-1010 SLP Individual Time Calculation (min): 55 min  Short Term Goals: Week 1: SLP Short Term Goal 1 (Week 1): Patient will consume current diet with minimal overt s/s of aspiration with overall Mod A verbal and visual cues for use of compensatory strategies. SLP Short Term Goal 2 (Week 1): Patient will vocalize on command in 25% of opportunities with Max multimodal cues. SLP Short Term Goal 3 (Week 1): Patient will establish some form of basic communication to indicate yes/no in regards to wants/needs with Max A multimodal cues. SLP Short Term Goal 4 (Week 1): Patient will follow 1-step commands in 50% of opportunities with Max multimodal cues. SLP Short Term Goal 5 (Week 1): Patient will demonstrate sustained attention to tasks for ~5 minutes with Max A multimodal cues for redirection. SLP Short Term Goal 6 (Week 1): Patient will initiate tasks in 50% of opportunitied with Max A multimodal cues.  Skilled Therapeutic Interventions: Skilled treatment session focused on communication goals. SLP facilitated session by providing more than a reasonable amount of time and Mod A verbal cues to match a functional object to a written word from a field of 2 with 50% accuracy. Patient also answered yes/no questions while pointing to written aids in regards to orientation information and biographical information with 60% accuracy. Patient's father present for last half of session and had multiple questions regarding patient's current function, prognosis, etc. SLP provided extensive education in regards to roles cognition, aphasia and apraxia are impacting patient's overall function and inconsistnecy throughout the day. He verbalized understanding. Patient left upright in bed with alarm on and all needs within reach. Continue with  current plan of care.      Pain Pain Assessment Pain Scale: 0-10 Pain Score: 0-No pain Faces Pain Scale: No hurt  Therapy/Group: Individual Therapy  Gregory Moreno 10/27/2018, 12:44 PM

## 2018-10-28 ENCOUNTER — Inpatient Hospital Stay (HOSPITAL_COMMUNITY): Payer: 59 | Admitting: Speech Pathology

## 2018-10-28 ENCOUNTER — Inpatient Hospital Stay (HOSPITAL_COMMUNITY): Payer: 59

## 2018-10-28 ENCOUNTER — Inpatient Hospital Stay (HOSPITAL_COMMUNITY): Payer: 59 | Admitting: Physical Therapy

## 2018-10-28 MED ORDER — DEXAMETHASONE 2 MG PO TABS
1.0000 mg | ORAL_TABLET | Freq: Two times a day (BID) | ORAL | Status: DC
  Administered 2018-10-28 – 2018-11-07 (×20): 1 mg via ORAL
  Filled 2018-10-28 (×20): qty 1

## 2018-10-28 MED ORDER — METHYLPHENIDATE HCL 5 MG PO TABS
10.0000 mg | ORAL_TABLET | Freq: Two times a day (BID) | ORAL | Status: DC
  Administered 2018-10-28 – 2018-11-27 (×60): 10 mg via ORAL
  Filled 2018-10-28 (×62): qty 2

## 2018-10-28 MED ORDER — BACLOFEN 5 MG HALF TABLET
5.0000 mg | ORAL_TABLET | Freq: Three times a day (TID) | ORAL | Status: DC
  Administered 2018-10-28 – 2018-11-27 (×92): 5 mg via ORAL
  Filled 2018-10-28 (×94): qty 1

## 2018-10-28 MED ORDER — ESCITALOPRAM OXALATE 10 MG PO TABS
5.0000 mg | ORAL_TABLET | Freq: Every day | ORAL | Status: DC
  Administered 2018-10-28 – 2018-11-27 (×32): 5 mg via ORAL
  Filled 2018-10-28 (×30): qty 1

## 2018-10-28 NOTE — Progress Notes (Signed)
Occupational Therapy Session Note  Patient Details  Name: Gregory Moreno MRN: [id number] Date of Birth: 1995/06/13  Today's Date: 10/28/2018 OT Individual Time: 1100-1200 OT Individual Time Calculation (min): 60 min    Short Term Goals: Week 1:  OT Short Term Goal 1 (Week 1): Pt will sit EOB/EOM with MOD A during functional dynamic sitting balance task OT Short Term Goal 2 (Week 1): Pt will sit to stand with MAX A of 1 in stedy OT Short Term Goal 3 (Week 1): Pt will initate washing face with VC only OT Short Term Goal 4 (Week 1): Pt will locate toothbrush with mod multimodal cues for improved visual scanning OT Short Term Goal 5 (Week 1): Pt will completes 1/4 steps of UB dressing  Skilled Therapeutic Interventions/Progress Updates:    Pt received sitting up in the TIS w/c with no indications of pain. Pt completed face washing with only min cueing. Pt with improved initiation this session with increased time. Pt required max A to doff his shirt. Pt completed UB bathing with mod cueing and mod A overall. Pt able to don shirt with mod A sitting up, with pt able to pull over head and thread RUE into the sleeve. Pt completed sit > stand with 3 musketeers method, still total A+2 but had improved initiation and BLE muscle activation. Heavy blocking/stabilizaiton of RLE during stand. Pt observed to be incontinent through his clothes. Pt was transferred back to bed via a slideboard with total A +2. [name] LB clothing was changed and peri hygiene completed (smear of BM) with total A +2. From supine, e-stim was applied to pt's RUE as described below. Pt was left supine with all needs met, bed alarm set.  1:1 NMES applied to biceps to help increase muscle activation and initiation.  Ratio 1:3 Rate 35 pps Waveform- Asymmetric Ramp 1.0 Pulse 300 Intensity- 10 Duration -  8 min  Indications of pain at the beginning of session: none Indications of pain at the end of session: none  No adverse  reactions after treatment and is skin intact.   Therapy Documentation Precautions:  Precautions Precautions: Fall Precaution Comments: L crani Restrictions Weight Bearing Restrictions: No   Therapy/Group: Individual Therapy  [name] 10/28/2018, 12:12 PM

## 2018-10-28 NOTE — Patient Care Conference (Signed)
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 Cerebral edema ([facility])   . 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 Steroid-induced hyperglycemia   . Spastic hemiplegia affecting nondominant side ([place])   . Hyponatremia   . Transaminitis   . Leucocytosis   . Right spastic hemiplegia ([place]) 10/21/2018  . Dysphagia 10/21/2018  . 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 Brain mass   . ICH (intracerebral hemorrhage) ([place]) 10/13/2018    Expected Discharge Date: Expected Discharge Date: (3-4 weeks)  Team Members Present: Physician leading conference: Dr. Alger Simons Social Worker Present: Lennart Pall, LCSW Nurse Present: Ellison Carwin, LPN PT Present: Lavone Nian, PT OT Present: Laverle Hobby, OT SLP Present: Weston Anna, SLP PPS Coordinator present : Gunnar Fusi, SLP     Current Status/Progress Goal Weekly Team Focus  Bowel/Bladder   Patient incontinent of B&B.LBM 10/12  To become continent of B&B  Assess and monitor q shift   Swallow/Nutrition/ Hydration   Dys. 2 textures with thin liquids, Max A  Supervision  tolerance of current diet, use of strategies   ADL's   total assist +2 for all tasks, poor initiation, no efforts of communication, following 1 step directions with increased time  min-mod A overall  Initiation, bed mobility, ADL transfers, ADL retraining, cognition/communication   Mobility   +2 total assist overall for bed mobility, bed<>w/c transfers, impaired cognition/communcation  min assist overall, will likely be downgraded  prior to d/c, anticipate w/c level  activity tolerance, bed mobility, transfers, sitting balance, midline orientation, awareness   Communication   Total A  Max A  establish yes/no communication, follow 1-step commands   Safety/Cognition/ Behavioral Observations  Max-Total A  Min A  attention, initiation   Pain   No pain. Patient has scheduled baclofen  remin pain free  Continue to monitor and asses q shift. Give prn meds as ordered.   Skin   incision to left side of head open to air (scabbed)  Remain from skin breakdown and free of infection  assess and monitor q shift    Rehab Goals Patient on target to meet rehab goals: Yes *See Care Plan and progress notes for long and short-term goals.     Barriers to Discharge  Current Status/Progress Possible Resolutions Date Resolved   Nursing                  PT  Home environment access/layout  1 step without rails to enter home              OT Incontinence;Medical stability  Still pending pathology results             SLP                SW                Discharge Planning/Teaching Needs:  Home with parents to provide 24/7 assistance.  Parents here daily;  formal teaching to be completed closer to d/c.  Team Discussion:  MD still awaiting path;  Dense aphasia and right -sided tone.  Increase in ritalin.  Concern for depression and MD adding med.  incont b/b and continues with nosebleeds - adding saline spra.  Follows 1 step commands.  Total +2 overall with PT/OT and min goals, however, concern may need to downgrade to mod/ max assist.  ST asking about E stim - MD ok'd.  No vocalizations yet.  Family very involved and supportive.  Revisions to Treatment Plan:  NA    Medical Summary Current Status: left brain tumor with hemorrhage, significant aphasia, epistaxis. spastic right hemiparesis Weekly Focus/Goal: nutrition, tone mgt, improve initiation/attention  Barriers to Discharge: Medical stability   Possible Resolutions to  Barriers: see medical chart   Continued Need for Acute Rehabilitation Level of Care: The patient requires daily medical management by a physician with specialized training in physical medicine and rehabilitation for the following reasons: Direction of a multidisciplinary physical rehabilitation program to maximize functional independence : Yes Medical management of patient stability for increased activity during participation in an intensive rehabilitation regime.: Yes Analysis of laboratory values and/or radiology reports with any subsequent need for medication adjustment and/or medical intervention. : Yes   I attest that I was present, lead the team conference, and concur with the assessment and plan of the team.   Rut Betterton 10/28/2018, 11:13 AM   Team conference was held via web/ teleconference due to [place] - 19

## 2018-10-28 NOTE — Plan of Care (Signed)
  Problem: Consults Goal: [facility] BRAIN INJURY PATIENT EDUCATION Description: Description: See Patient Education module for eduction specifics 10/28/2018 1545 by Adria Devon, LPN Outcome: Progressing 10/28/2018 1541 by Ellison Carwin A, LPN Outcome: Progressing   Problem: RH BOWEL ELIMINATION Goal: RH STG MANAGE BOWEL WITH ASSISTANCE Description: STG Manage Bowel with max Assistance. 10/28/2018 1545 by Adria Devon, LPN Outcome: Progressing 10/28/2018 1541 by Adria Devon, LPN Outcome: Progressing Goal: RH STG MANAGE BOWEL W/MEDICATION W/ASSISTANCE Description: STG Manage Bowel with Medication with max Assistance. 10/28/2018 1545 by Adria Devon, LPN Outcome: Progressing 10/28/2018 1541 by Adria Devon, LPN Outcome: Progressing   Problem: RH BLADDER ELIMINATION Goal: RH STG MANAGE BLADDER WITH ASSISTANCE Description: STG Manage Bladder With max Assistance 10/28/2018 1545 by Adria Devon, LPN Outcome: Progressing 10/28/2018 1541 by Ellison Carwin A, LPN Outcome: Progressing   Problem: RH SKIN INTEGRITY Goal: RH STG SKIN FREE OF INFECTION/BREAKDOWN 10/28/2018 1545 by Adria Devon, LPN Outcome: Progressing 10/28/2018 1541 by Adria Devon, LPN Outcome: Progressing Goal: RH STG MAINTAIN SKIN INTEGRITY WITH ASSISTANCE Description: STG Maintain Skin Integrity With max Assistance. 10/28/2018 1545 by Adria Devon, LPN Outcome: Progressing 10/28/2018 1541 by Adria Devon, LPN Outcome: Progressing Goal: RH STG ABLE TO PERFORM INCISION/WOUND CARE W/ASSISTANCE Description: STG Able To Perform Incision/Wound Care With max Assistance. 10/28/2018 1545 by Adria Devon, LPN Outcome: Progressing 10/28/2018 1541 by Adria Devon, LPN Outcome: Progressing   Problem: RH SAFETY Goal: RH STG ADHERE TO SAFETY PRECAUTIONS W/ASSISTANCE/DEVICE Description: STG Adhere to Safety Precautions With max  Assistance/Device. 10/28/2018 1545 by Adria Devon, LPN Outcome: Progressing 10/28/2018 1541 by Adria Devon, LPN Outcome: Progressing   Problem: RH COGNITION-NURSING Goal: RH STG ANTICIPATES NEEDS/CALLS FOR ASSIST W/ASSIST/CUES Description: STG Anticipates Needs/Calls for Assist With max Assistance/Cues. 10/28/2018 1545 by Adria Devon, LPN Outcome: Progressing 10/28/2018 1541 by Ellison Carwin A, LPN Outcome: Progressing   Problem: RH PAIN MANAGEMENT Goal: RH STG PAIN MANAGED AT OR BELOW [name] PAIN GOAL Description: Less than 3 out of 10  10/28/2018 1545 by Adria Devon, LPN Outcome: Progressing 10/28/2018 1541 by Adria Devon, LPN Outcome: Progressing   Problem: RH KNOWLEDGE DEFICIT BRAIN INJURY Goal: RH STG INCREASE KNOWLEDGE OF SELF CARE AFTER BRAIN INJURY 10/28/2018 1545 by Ellison Carwin A, LPN Outcome: Progressing 10/28/2018 1541 by Ellison Carwin A, LPN Outcome: Progressing Goal: RH STG INCREASE KNOWLEDGE OF DYSPHAGIA/FLUID INTAKE 10/28/2018 1545 by Ellison Carwin A, LPN Outcome: Progressing 10/28/2018 1541 by Adria Devon, LPN Outcome: Progressing   Problem: Consults Goal: Diabetes Guidelines if Diabetic/Glucose > 140 Description: If diabetic or lab glucose is > 140 mg/dl - Initiate Diabetes/Hyperglycemia Guidelines & Document Interventions  10/28/2018 1545 by Adria Devon, LPN Outcome: Progressing 10/28/2018 1541 by Adria Devon, LPN Outcome: Progressing

## 2018-10-28 NOTE — Progress Notes (Signed)
[place] PHYSICAL MEDICINE & REHABILITATION PROGRESS NOTE  Subjective/Complaints: Eating with NT again. Did some self-feeding with tactile cues.   ROS: limited due to language/communication    Objective: Vital Signs: Blood pressure 123/72, pulse 83, temperature 98.1 F (36.7 C), resp. rate 18, height 5\' 7"  (1.702 m), weight 66.1 kg, SpO2 98 %. No results found. Recent Labs    10/27/18 1129  WBC 18.0*  HGB 11.9*  HCT 34.6*  PLT 255   Recent Labs    10/27/18 0659  NA 133*  K 4.3  CL 97*  CO2 25  GLUCOSE 100*  BUN 23*  CREATININE 0.81  CALCIUM 9.2    Physical Exam: BP 123/72 (BP Location: Left Arm)   Pulse 83   Temp 98.1 F (36.7 C)   Resp 18   Ht 5\' 7"  (1.702 m)   Wt 66.1 kg   SpO2 98%   BMI 22.82 kg/m  Constitutional: No distress . Vital signs reviewed. HEENT: EOMI, oral membranes moist Neck: supple Cardiovascular: RRR without murmur. No JVD    Respiratory: CTA Bilaterally without wheezes or rales. Normal effort    GI: BS +, non-tender, non-distended  Skin: crani site intact. Psych: flat Musc: No edema in extremities.  No tenderness in extremities. Neuro: Alert. Does not engage. Does make eye contact Motor: Limited MMT d/t language. Sensed pain L>R, not following commands, intermittent spont movement LLE and LUE ongoing.   Assessment/Plan: 1. 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  Physiatrist is providing close team supervision and 24 hour management of active medical problems listed below.  Physiatrist and rehab team continue to assess barriers to discharge/monitor patient progress toward functional and medical goals  Care Tool:  Bathing    Body parts bathed by patient: Right arm, Chest, Abdomen, Left upper leg, Right upper leg   Body parts bathed by helper: Left arm, Front perineal area,  Buttocks, Right upper leg, Left upper leg, Right lower leg, Left lower leg, Face     Bathing assist Assist Level: Maximal Assistance - Patient 24 - 49%     Upper Body Dressing/Undressing Upper body dressing   What is the patient wearing?: Pull over shirt    Upper body assist Assist Level: Moderate Assistance - Patient 50 - 74%    Lower Body Dressing/Undressing Lower body dressing      What is the patient wearing?: Pants, Incontinence brief     Lower body assist Assist for lower body dressing: Total Assistance - Patient < 25%     Toileting Toileting    Toileting assist Assist for toileting: Dependent - Patient 0%     Transfers Chair/bed transfer  Transfers assist  Chair/bed transfer activity did not occur: Safety/medical concerns  Chair/bed transfer assist level: 2 Helpers     Locomotion Ambulation   Ambulation assist   Ambulation activity did not occur: Safety/medical concerns          Walk 10 feet activity   Assist  Walk 10 feet activity did not occur: Safety/medical concerns        Walk 50 feet activity   Assist Walk 50 feet with 2 turns activity did not occur: Safety/medical concerns         Walk 150 feet activity   Assist Walk 150 feet activity did not occur: Safety/medical concerns         Walk 10 feet on  uneven surface  activity   Assist Walk 10 feet on uneven surfaces activity did not occur: Safety/medical concerns         Wheelchair     Assist Will patient use wheelchair at discharge?: (TBD)   Wheelchair activity did not occur: Safety/medical concerns         Wheelchair 50 feet with 2 turns activity    Assist    Wheelchair 50 feet with 2 turns activity did not occur: Safety/medical concerns       Wheelchair 150 feet activity     Assist Wheelchair 150 feet activity did not occur: Safety/medical concerns          Medical Problem List and Plan: 1.Right greater than left-sided  weaknesssecondary to intracerebral hemorrhage with intracranial tumor.Surgery was done emergently due to signs of herniation due to notes.Status post left craniotomy for hematoma evacuation and tumor resection 10/13/2018.   Pathology report remains pending on 10/12  Continue CIR PT, OT, SLP ---team conference today   2. Antithrombotics: -DVT/anticoagulation:Subcutaneous heparin initiated 10/16/2018 -antiplatelet therapy: N/A 3. Pain Management:Tylenol as needed 4. Mood:Provide emotional support -antipsychotic agents: N/A 5. Neuropsych: This patientis not of making decisions on hisown behalf. 6. Skin/Wound Care:Routine skin checks 7. Fluids/Electrolytes/Nutrition:Routine in and outs   -BUN/Cr sl elevated, but stable to improved  -encourage PO, recheck later this week 8. Dysphagia. Dysphasia #2 thin liquids. Follow-up speech therapy  Advance diet as tolerated 9. 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 Still very slow to initiate. Increased to 5mg  bid 10/12 10. Constipation  Bowel meds as necessary 11. Spasticity  Baclofen 5mg  2 times daily started on 10/9---increase to TID 10/13 12. Hiccups  Baclofen as needed  Improved 13.  Steroid-induced hyperglycemia  Continue to monitor with steroid wean 14.  Hyponatremia  Sodium 133, stable 10/12  Continue to monitor 15.  Transaminitis  LFTs remain elevated--but stable to improved 10/12 16.  Leukocytosis-likely steroid-induced  WBCs 18.0 on 10/7, and again 10/12  Afebrile      LOS: 7 days A FACE TO [place] 10/28/2018, 9:09 AM

## 2018-10-28 NOTE — Plan of Care (Signed)
  Problem: Consults Goal: RH BRAIN INJURY PATIENT EDUCATION Description: Description: See Patient Education module for eduction specifics Outcome: Progressing   Problem: RH BOWEL ELIMINATION Goal: RH STG MANAGE BOWEL WITH ASSISTANCE Description: STG Manage Bowel with max Assistance. Outcome: Progressing Goal: RH STG MANAGE BOWEL W/MEDICATION W/ASSISTANCE Description: STG Manage Bowel with Medication with max Assistance. Outcome: Progressing   Problem: RH BLADDER ELIMINATION Goal: RH STG MANAGE BLADDER WITH ASSISTANCE Description: STG Manage Bladder With max Assistance Outcome: Progressing   Problem: RH SKIN INTEGRITY Goal: RH STG SKIN FREE OF INFECTION/BREAKDOWN Outcome: Progressing Goal: RH STG MAINTAIN SKIN INTEGRITY WITH ASSISTANCE Description: STG Maintain Skin Integrity With max Assistance. Outcome: Progressing Goal: RH STG ABLE TO PERFORM INCISION/WOUND CARE W/ASSISTANCE Description: STG Able To Perform Incision/Wound Care With max Assistance. Outcome: Progressing   Problem: RH SAFETY Goal: RH STG ADHERE TO SAFETY PRECAUTIONS W/ASSISTANCE/DEVICE Description: STG Adhere to Safety Precautions With max Assistance/Device. Outcome: Progressing   Problem: RH COGNITION-NURSING Goal: RH STG ANTICIPATES NEEDS/CALLS FOR ASSIST W/ASSIST/CUES Description: STG Anticipates Needs/Calls for Assist With max Assistance/Cues. Outcome: Progressing   Problem: RH PAIN MANAGEMENT Goal: RH STG PAIN MANAGED AT OR BELOW [name] PAIN GOAL Description: Less than 3 out of 10  Outcome: Progressing   Problem: RH KNOWLEDGE DEFICIT BRAIN INJURY Goal: RH STG INCREASE KNOWLEDGE OF SELF CARE AFTER BRAIN INJURY Outcome: Progressing Goal: RH STG INCREASE KNOWLEDGE OF DYSPHAGIA/FLUID INTAKE Outcome: Progressing   Problem: Consults Goal: Diabetes Guidelines if Diabetic/Glucose > 140 Description: If diabetic or lab glucose is > 140 mg/dl - Initiate Diabetes/Hyperglycemia Guidelines & Document  Interventions  Outcome: Progressing

## 2018-10-28 NOTE — Progress Notes (Signed)
Physical Therapy Session Note  Patient Details  Name: Gregory Moreno MRN: [id number] Date of Birth: 11/13/1995  Today's Date: 10/28/2018 PT Individual Time: [id number] PT Individual Time Calculation (min): 71 min   Short Term Goals: Week 1:  PT Short Term Goal 1 (Week 1): Pt will consistently complete bed mobilty with max assist +1. PT Short Term Goal 2 (Week 1): Pt will transfer bed<>w/c with max assist +1. PT Short Term Goal 3 (Week 1): Pt will initiate sit>stand with max assist +1 & LRAD.  Skilled Therapeutic Interventions/Progress Updates:  Pt received in bed with NT present assisting pt with breakfast.  Pt agreeable to tx. Therapist provides total assist for donning pants & B shoes for time management. Pt requires +2 for rolling L<>R & supine<>sit throughout session with therapist attempting to place LUE to allow pt to push to sit. Pt is able to initiate and assist with putting LLE on/off of EOB. Pt transfers bed>w/c and w/c<>mat table with slide board +2 assist with assistance for anterior weight shifting and weight shifting buttocks across board. From EOM pt engaged in reaching for & dropping bean bags into cornhole board, as well as using LLE to push them into correct place. Pt is able to scan eyes & turn head to L while sitting EOM but does not initiate or engage in turning head to L when supine on mat table. Assisted pt with transitioning to prone on mat table for total body extensor stretch, pt tolerated position ~8 minutes. At end of session pt left in w/c with chair alarm donned, call bell in reach, mother present in room. Reviewed basic tasks pt's mother could assist him with in the room.  Pain: pt moan/groans when assisted to prone position but appears to be more comfortable when LUE is repositioned.  Therapy Documentation Precautions:  Precautions Precautions: Fall Precaution Comments: L crani Restrictions Weight Bearing Restrictions: No    Therapy/Group: Individual  Therapy  [name] 10/28/2018, 10:11 AM

## 2018-10-28 NOTE — Progress Notes (Signed)
Speech Language Pathology Daily Session Notes  Patient Details  Name: Gregory Moreno MRN: [id number] Date of Birth: December 29, 1995  Today's Date: 10/28/2018  Session 1: SLP Individual Time: [id number] SLP Individual Time Calculation (min): 30 min   Session 2: SLP Individual Time: 1430-1500 SLP Individual Time Calculation (min): 30 min  Short Term Goals: Week 1: SLP Short Term Goal 1 (Week 1): Patient will consume current diet with minimal overt s/s of aspiration with overall Mod A verbal and visual cues for use of compensatory strategies. SLP Short Term Goal 2 (Week 1): Patient will vocalize on command in 25% of opportunities with Max multimodal cues. SLP Short Term Goal 3 (Week 1): Patient will establish some form of basic communication to indicate yes/no in regards to wants/needs with Max A multimodal cues. SLP Short Term Goal 4 (Week 1): Patient will follow 1-step commands in 50% of opportunities with Max multimodal cues. SLP Short Term Goal 5 (Week 1): Patient will demonstrate sustained attention to tasks for ~5 minutes with Max A multimodal cues for redirection. SLP Short Term Goal 6 (Week 1): Patient will initiate tasks in 50% of opportunitied with Max A multimodal cues.  Skilled Therapeutic Interventions:  Session 1: Skilled treatment session focused on communication goals. SLP facilitated session by providing total A multimodal cues for patient to answer basic yes/no questions with use of written aids. Despite Total A, patient only initiated answering question by pointing X 1 without any other attempts to communicate. SLP also facilitated session by providing Max A multimodal cues for patient to vocalize on command without success. SLP attempted to provide oral care, however, patient with minimal oral movement and biting of toothbrush. Patient left upright in bed with alarm on and all needs within reach. Continue with current plan of care.   Session 2: Skilled treatment session focused  on cognitive-linguistic goals. SLP facilitated session by providing extra time and supervision level verbal cues for patient to sort coins from a field of 4. Patient able to follow commands in regards to choosing appropriate coins (give me 1 dime and 1 nickel) with Min A verbal cues but required increased cueing with overall Mod A verbal cues and extra time to make specific amounts of change (35 cents and 6 cents). Patient appeared more alert and engaged this session and smiled appropriately ~6 times. Patient also independently initiated waving bye to clinician. Patient left upright in bed with alarm on and all needs within reach. Continue with current plan of care.   Pain No indications of pain   Therapy/Group: Individual Therapy  Tylen Leverich 10/28/2018, 10:21 AM

## 2018-10-29 ENCOUNTER — Inpatient Hospital Stay (HOSPITAL_COMMUNITY): Payer: 59 | Admitting: Speech Pathology

## 2018-10-29 ENCOUNTER — Inpatient Hospital Stay (HOSPITAL_COMMUNITY): Payer: 59

## 2018-10-29 ENCOUNTER — Inpatient Hospital Stay (HOSPITAL_COMMUNITY): Payer: 59 | Admitting: Physical Therapy

## 2018-10-29 MED ORDER — SALINE SPRAY 0.65 % NA SOLN
1.0000 | NASAL | Status: DC | PRN
  Filled 2018-10-29: qty 44

## 2018-10-29 MED ORDER — PANTOPRAZOLE SODIUM 40 MG PO PACK
40.0000 mg | PACK | Freq: Every day | ORAL | Status: DC
  Administered 2018-10-29 – 2018-11-20 (×23): 40 mg
  Filled 2018-10-29 (×21): qty 20

## 2018-10-29 NOTE — Progress Notes (Signed)
Was given in report that patient has been having nosebleeds & that his heparin was held. On call provider was called & informed that patient has not had any heparin since the morning of 10/28/18 & that he was reported to have nosebleeds. None was witnessed this shift as yet. Ordered to hold heparin this shift & to be addressed in the morning with provider.At the beginning of the shift, he was up in his wheelchair with family present. He was transferred to the bed by staff & given hygiene care. Patient was noted to have a quarter size red area to the right inner heel that is blanchable when pressed. A foam drsg was applied for protection. No acute distress noted.

## 2018-10-29 NOTE — Plan of Care (Signed)
Goals downgraded to mod assist w/c level 2/2 slow progress due to significant cognitive & physical impairments. Ambulatory goals discontinued as they are not applicable at this time - it is anticipated pt will d/c at a w/c level.   Problem: RH Balance Goal: LTG Patient will maintain dynamic sitting balance (PT) Description: LTG:  Patient will maintain dynamic sitting balance with assistance during mobility activities (PT) Flowsheets (Taken 10/29/2018 0848) LTG: Pt will maintain dynamic sitting balance during mobility activities with:: (downgrade 2/2 slow progress & significant physical & cognitive impairments) Moderate Assistance - Patient 50 - 74% Note: downgrade 2/2 slow progress & significant physical & cognitive impairments Goal: LTG Patient will maintain dynamic standing balance (PT) Description: LTG:  Patient will maintain dynamic standing balance with assistance during mobility activities (PT) Outcome: Not Applicable Flowsheets (Taken 10/29/2018 0848) LTG: Pt will maintain dynamic standing balance during mobility activities with:: (d/c goal - not appropriate at this time, anticipate pt will d/c at w/c level) --   Problem: Sit to Stand Goal: LTG:  Patient will perform sit to stand with assistance level (PT) Description: LTG:  Patient will perform sit to stand with assistance level (PT) Flowsheets (Taken 10/29/2018 0848) LTG: PT will perform sit to stand in preparation for functional mobility with assistance level: (downgrade 2/2 slow progress & significant physical & cognitive impairments) Maximal Assistance - Patient 25 - 49% Note: downgrade 2/2 slow progress & significant physical & cognitive impairments   Problem: RH Bed Mobility Goal: LTG Patient will perform bed mobility with assist (PT) Description: LTG: Patient will perform bed mobility with assistance, with/without cues (PT). Flowsheets (Taken 10/29/2018 0848) LTG: Pt will perform bed mobility with assistance level of:  (downgrade 2/2 slow progress & significant physical & cognitive impairments) Moderate Assistance - Patient 50 - 74% Note: downgrade 2/2 slow progress & significant physical & cognitive impairments   Problem: RH Bed to Chair Transfers Goal: LTG Patient will perform bed/chair transfers w/assist (PT) Description: LTG: Patient will perform bed to chair transfers with assistance (PT). Flowsheets (Taken 10/29/2018 0848) LTG: Pt will perform Bed to Chair Transfers with assistance level: (downgrade 2/2 slow progress & significant physical & cognitive impairments) Moderate Assistance - Patient 50 - 74% Note: downgrade 2/2 slow progress & significant physical & cognitive impairments   Problem: RH Car Transfers Goal: LTG Patient will perform car transfers with assist (PT) Description: LTG: Patient will perform car transfers with assistance (PT). Flowsheets (Taken 10/29/2018 0848) LTG: Pt will perform car transfers with assist:: (downgrade 2/2 slow progress & significant physical & cognitive impairments) Maximal Assistance - Patient 25 - 49% Note: downgrade 2/2 slow progress & significant physical & cognitive impairments   Problem: RH Ambulation Goal: LTG Patient will ambulate in controlled environment (PT) Description: LTG: Patient will ambulate in a controlled environment, # of feet with assistance (PT). Outcome: Not Applicable Flowsheets (Taken 10/29/2018 0848) LTG: Pt will ambulate in controlled environ  assist needed:: (d/c goal - not appropriate at this time, anticipate pt will d/c at w/c level) -- Note: D/c goal - not appropriate at this time, anticipate pt will d/c at w/c level Goal: LTG Patient will ambulate in home environment (PT) Description: LTG: Patient will ambulate in home environment, # of feet with assistance (PT). Outcome: Not Applicable Flowsheets (Taken 10/29/2018 0848) LTG: Pt will ambulate in home environ  assist needed:: (D/c goal - not appropriate at this time, anticipate pt  will d/c at w/c level) -- Note: D/c goal - not appropriate at this  time, anticipate pt will d/c at w/c level   Problem: RH Wheelchair Mobility Goal: LTG Patient will propel w/c in controlled environment (PT) Description: LTG: Patient will propel wheelchair in controlled environment, # of feet with assist (PT) Flowsheets (Taken 10/29/2018 0848) LTG: Pt will propel w/c in controlled environ  assist needed:: (downgrade 2/2 slow progress 2/2 significant cognitive & physical impairments) Moderate Assistance - Patient 50 - 74% LTG: Propel w/c distance in controlled environment: 50 ft Note: downgrade 2/2 slow progress 2/2 significant cognitive & physical impairments

## 2018-10-29 NOTE — Progress Notes (Signed)
Occupational Therapy Session Note  Patient Details  Name: Gregory Moreno MRN: [id number] Date of Birth: 04/16/1995  Today's Date: 10/29/2018 OT Individual Time: 6720-9470 OT Individual Time Calculation (min): 75 min    Short Term Goals: Week 1:  OT Short Term Goal 1 (Week 1): Pt will sit EOB/EOM with MOD A during functional dynamic sitting balance task OT Short Term Goal 2 (Week 1): Pt will sit to stand with MAX A of 1 in stedy OT Short Term Goal 3 (Week 1): Pt will initate washing face with VC only OT Short Term Goal 4 (Week 1): Pt will locate toothbrush with mod multimodal cues for improved visual scanning OT Short Term Goal 5 (Week 1): Pt will completes 1/4 steps of UB dressing  Skilled Therapeutic Interventions/Progress Updates:    Pt received supine with no indications of pain. NT present assisting with breakfast. Pt able to bring spoon to mouth with assistance required for loading. Pt completed bed mobility to EOB with total A. Overall improved initiation this session with LUE. In addition, tone vs. Reflexive movements in pt's R bicep observed several times during session. Pt completed slideboard transfer from EOB to roll in shower chair with total A +2. Pt doffed shirt with max A. Pt completed UB bathing in shower with max A overall, requiring facilitation to lift RUE and mod cueing for washing thoroughly. Pt required total A for peri hygiene in shower. Mod A to wash hair. Pt donned shirt with max A, pt able to thread LUE into shirt as well as initiate bringing shirt over head. Pt was returned to bed via 3 musketeers stand pivot transfer, total A +2. Brief donned total A. Pt initiated pulling pants up supine, max A +2 overall to don. Care coordination completed with nursing staff re potential use of condom cath 2/2 frequent and heavy urinary incontinence limiting therapy intervention at times. Pt was offered to finish eating yogurt from breakfast and he initiated dipping spoon and bringing  to mouth. Edu pt's father on supervising the consumption of this yogurt only for now and importance of SLP provided feeding instructions. He demonstrated appropriate use of strategies and no overt s/s of aspiration. Pt was left supine with all needs met, bed alarm set.   Therapy Documentation Precautions:  Precautions Precautions: Fall Precaution Comments: L crani Restrictions Weight Bearing Restrictions: No   Therapy/Group: Individual Therapy  [name] 10/29/2018, 10:13 AM

## 2018-10-29 NOTE — Progress Notes (Signed)
Physical Therapy Session Note  Patient Details  Name: Gregory Moreno MRN: [id number] Date of Birth: Nov 24, 1995  Today's Date: 10/29/2018 PT Individual Time: 1105-1201 PT Individual Time Calculation (min): 56 min   Short Term Goals: Week 2:  PT Short Term Goal 1 (Week 2): Pt will consistently complete bed mobilty with max assist +1. PT Short Term Goal 2 (Week 2): Pt will transfer bed<>w/c with max assist +1. PT Short Term Goal 3 (Week 2): Pt will demonstrate dynamic sitting balance with max assist.  Skilled Therapeutic Interventions/Progress Updates:  Pt received in bed with father present to observe session. Therapist dons B shoes total assist for time management. Pt rolls supine>R sidelying with total assist +1 and R sidelying>sitting with total assist +1 but pt with obvious activation of LUE to attempt to assist with pushing. Pt transfers bed>w/c and w/c<>mat table, all downhill to increase ease of transfer, with therapist providing total assist for positioning LUE to assist with lateral leans but total assist for slide board placement. During 2/3 transfers pt assists with scooting buttocks across board by pulling with LUE on w/c armrest, with pt requiring extra time & ultimately assistance to transfer buttocks completely to w/c but with great improvement noted on this date! In dayroom, pt sits EOM & engages in L lateral leans, then leaning to R with LUE support with task focusing on LUE strengthening & returning to midline with pt requiring min assist when going to L but max assist to return to midline from R. Pt engaged in reaching across midline then anteriorly to retrieve bean bags then place in [employer]. Pt unable to return to upright midline from anterior weight shifting & little attempts to correct was observed. Transitioned to focusing on anterior weight shifting from posterior lean but pt requires total assist to do so, with no core activation noted. At end of session pt left in w/c  with chair alarm donned, call bell in reach, dad present in room.  Educated pt's father Garavito) on anticipation of pt going home at w/c level with Zenia Resides reporting they will install a small ramp at their 1 step to enter the home through the garage.   Therapy Documentation Precautions:  Precautions Precautions: Fall Precaution Comments: L crani Restrictions Weight Bearing Restrictions: No  Pain: No behaviors demonstrating pain during session.   Therapy/Group: Individual Therapy  [name] 10/29/2018, 12:36 PM

## 2018-10-29 NOTE — Progress Notes (Signed)
Speech Language Pathology Weekly Progress and Session Note  Patient Details  Name: Gregory Moreno MRN: [id number] Date of Birth: [birth year]  Beginning of progress report period: October 22, 2018 End of progress report period: October 29, 2018  Today's Date: 10/29/2018  Session 1: SLP Individual Time: 7564-3329 SLP Individual Time Calculation (min): 30 min   Session 2: SLP Individual Time: 5188-4166 SLP Individual Time Calculation (min): 20 min Missed Time: 10 minutes due to toileting   Short Term Goals: Week 1: SLP Short Term Goal 1 (Week 1): Patient will consume current diet with minimal overt s/s of aspiration with overall Mod A verbal and visual cues for use of compensatory strategies. SLP Short Term Goal 1 - Progress (Week 1): Met SLP Short Term Goal 2 (Week 1): Patient will vocalize on command in 25% of opportunities with Max multimodal cues. SLP Short Term Goal 2 - Progress (Week 1): Not met SLP Short Term Goal 3 (Week 1): Patient will establish some form of basic communication to indicate yes/no in regards to wants/needs with Max A multimodal cues. SLP Short Term Goal 3 - Progress (Week 1): Not met SLP Short Term Goal 4 (Week 1): Patient will follow 1-step commands in 50% of opportunities with Max multimodal cues. SLP Short Term Goal 4 - Progress (Week 1): Not met SLP Short Term Goal 5 (Week 1): Patient will demonstrate sustained attention to tasks for ~5 minutes with Max A multimodal cues for redirection. SLP Short Term Goal 5 - Progress (Week 1): Met SLP Short Term Goal 6 (Week 1): Patient will initiate tasks in 50% of opportunitied with Max A multimodal cues. SLP Short Term Goal 6 - Progress (Week 1): Met    New Short Term Goals: Week 2: SLP Short Term Goal 1 (Week 2): Patient will consume current diet with minimal overt s/s of aspiration with overall Min A verbal and visual cues for use of compensatory strategies. SLP Short Term Goal 2 (Week 2): Patient will vocalize on  command in 25% of opportunities with Max multimodal cues. SLP Short Term Goal 3 (Week 2): Patient will establish some form of basic communication to indicate yes/no in regards to wants/needs with Max A multimodal cues in 75% of opportunities SLP Short Term Goal 4 (Week 2): Patient will follow 1-step commands in 50% of opportunities with Max multimodal cues. SLP Short Term Goal 5 (Week 2): Patient will demonstrate sustained attention to tasks for ~10 minutes with Max A multimodal cues for redirection. SLP Short Term Goal 6 (Week 2): Patient will initiate tasks in 75% of opportunitied with Max A multimodal cues.  Weekly Progress Updates: Patient has made functional but inconsistent gains and has met 3 of 6 STGs this reporting period. Currently, patient is consuming Dys. 2 textures with thin liquids via straw with minimal overt s/s of aspiration and Mod A verbal cues for use of swallowing compensatory strategies. Patient remains nonverbal but demonstrates inconsistent ability to answer yes/no questions with use of written aids. Patient also demonstrates improved initiation and ability to follow basic 1-step commands inconsistently with overall Max A multimodal cues. At some point throughout this reporting period the patient has met all of these goals, however, extremely inconsistently. Patient and family education ongoing. Patient would benefit from continued skilled SLP intervention to maximize his swallowing, communication and cognitive functioning prior to discharge.      Intensity: Minumum of 1-2 x/day, 30 to 90 minutes Frequency: 3 to 5 out of 7 days Duration/Length of Stay: 3  weeks Treatment/Interventions: Cognitive remediation/compensation;Dysphagia/aspiration precaution training;Internal/external aids;Speech/Language facilitation;Therapeutic Activities;Environmental controls;Cueing hierarchy;Functional tasks;Patient/family education   Daily Session  Skilled Therapeutic Interventions:    Session 1: Skilled treatment session focused on communication goals. SLP facilitated session by providing extra time and Mod A multimodal cues for patient to answer basic yes/no questions with use of written aids in 75% of opportunities. SLP also facilitated session by providing Max A multimodal cues and multiple attempts for patient to vocalize on command without success and minimal movement of oral musculature.  Patient left upright in bed with alarm on and all needs within reach. Continue with current plan of care.   Session 2: Skilled treatment session focused on communication goals. Patient missed initial 15 minutes of session due to using the bedpan. SLP facilitated session by providing Mod-Max A verbal and tactile cues for patient to choose a written word for basic functions (eat/drink, etc) from a field of 2 with 50% accuracy. Suspect function impacted by fatigue. Patient left upright in bed with alarm on and dad present. Continue with current plan of care.     Pain No indications of pain   Therapy/Group: Individual Therapy  Gerlean Cid 10/29/2018, 6:57 AM

## 2018-10-29 NOTE — Progress Notes (Signed)
[name] mother reported that pt had nosebleed twice during the day, and was reported by day shift nurse also. Provider on-call was notified and heparin SQ scheduled on this shift will be held until they address it in am per provider. Currently, pt is not having any new nosebleeds. Will continue to monitor pt closely.

## 2018-10-29 NOTE — Progress Notes (Signed)
Occupational Therapy Weekly Progress Note  Patient Details  Name: Gregory Moreno MRN: [id number] Date of Birth: 04/21/1995  Beginning of progress report period: October 22, 2018 End of progress report period: October 29, 2018  Today's Date: 10/29/2018      Patient has met 4 of 5 short term goals. Pt continues working with therapy and showing improvement in some areas of weakness. Pt is showing improvement in ability to attend to a task and follow one step commands and responds well to multimodal cueing when completed ADL tasks. Pt will continue to benefit from skilled OT to address functional participation in ADL tasks and safe transfers for less caregiver assistance.   Patient continues to demonstrate the following deficits: muscle weakness, muscle joint tightness and muscle paralysis, impaired timing and sequencing, abnormal tone, unbalanced muscle activation, motor apraxia, decreased coordination and decreased motor planning, decreased midline orientation and ideational apraxia, decreased initiation, decreased attention, decreased awareness, decreased problem solving, decreased safety awareness, decreased memory and delayed processing and decreased sitting balance, decreased standing balance, decreased postural control, hemiplegia and decreased balance strategies and therefore will continue to benefit from skilled OT intervention to enhance overall performance with BADL and iADL.  Patient not progressing toward long term goals.  See goal revision..  Plan of care revisions: Several goals downgraded to mod A to reflect slow progress.  OT Short Term Goals Week 1:  OT Short Term Goal 1 (Week 1): Pt will sit EOB/EOM with MOD A during functional dynamic sitting balance task OT Short Term Goal 1 - Progress (Week 1): Not met OT Short Term Goal 2 (Week 1): Pt will sit to stand with MAX A of 1 in stedy OT Short Term Goal 2 - Progress (Week 1): Met OT Short Term Goal 3 (Week 1): Pt will initate  washing face with VC only OT Short Term Goal 3 - Progress (Week 1): Met OT Short Term Goal 4 (Week 1): Pt will locate toothbrush with mod multimodal cues for improved visual scanning OT Short Term Goal 4 - Progress (Week 1): Met OT Short Term Goal 5 (Week 1): Pt will completes 1/4 steps of UB dressing OT Short Term Goal 5 - Progress (Week 1): Met Week 2:  OT Short Term Goal 1 (Week 2): Pt will maintain sitting balance with R UE supported with Mod A OT Short Term Goal 2 (Week 2): Pt will follow one step commands with 75% accuracy OT Short Term Goal 3 (Week 2): Pt will complete log rolling in bed with min VC for hand placement OT Short Term Goal 4 (Week 2): Pt will initiate functional task with L UE with mod cueing       Therapy Documentation Precautions:  Precautions Precautions: Fall Precaution Comments: L crani Restrictions Weight Bearing Restrictions: No      Therapy/Group: Individual Therapy    10/29/2018, 4:34 PM   

## 2018-10-29 NOTE — Progress Notes (Signed)
Physical Therapy Weekly Progress Note  Patient Details  Name: Gregory Moreno MRN: [id number] Date of Birth: 22-Aug-1995  Beginning of progress report period: October 22, 2018 End of progress report period: October 29, 2018  Today's Date: 10/29/2018   Patient has met 0 of 3 short term goals.  Pt is making slow progress towards LTG's. Pt continues to require total assist +2 for all mobility (bed mobility, bed<>w/c transfers with slide board). Sessions have focused on scanning to L of midline, initiation & engagement in activities, slide board transfers, and sitting balance. Pt would benefit from continued skilled PT treatment to focus on the deficits noted above & below.   Patient continues to demonstrate the following deficits muscle weakness, decreased cardiorespiratoy endurance, decreased coordination and decreased motor planning, decreased visual perceptual skills, decreased attention to left, decreased initiation, decreased attention, decreased awareness, decreased problem solving, decreased safety awareness, decreased memory and delayed processing, and decreased sitting balance, decreased standing balance, decreased postural control and decreased balance strategies and therefore will continue to benefit from skilled PT intervention to increase functional independence with mobility.  Patient not progressing toward long term goals.  See goal revision..  Plan of care revisions: goals downgraded to mod assist overall, w/c level.  PT Short Term Goals Week 1:  PT Short Term Goal 1 (Week 1): Pt will consistently complete bed mobilty with max assist +1. PT Short Term Goal 1 - Progress (Week 1): Not met PT Short Term Goal 2 (Week 1): Pt will transfer bed<>w/c with max assist +1. PT Short Term Goal 2 - Progress (Week 1): Not met PT Short Term Goal 3 (Week 1): Pt will initiate sit>stand with max assist +1 & LRAD. PT Short Term Goal 3 - Progress (Week 1): Not met Week 2:  PT Short Term Goal 1 (Week  2): Pt will consistently complete bed mobilty with max assist +1. PT Short Term Goal 2 (Week 2): Pt will transfer bed<>w/c with max assist +1. PT Short Term Goal 3 (Week 2): Pt will demonstrate dynamic sitting balance with max assist.    Therapy Documentation Precautions:  Precautions Precautions: Fall Precaution Comments: L crani Restrictions Weight Bearing Restrictions: No  Therapy/Group: Individual Therapy  [name] 10/29/2018, 8:48 AM

## 2018-10-29 NOTE — Progress Notes (Signed)
[facility] PHYSICAL MEDICINE & REHABILITATION PROGRESS NOTE  Subjective/Complaints: Pt in bed working with SLP. SLP reports a little more interactive yesterday, able to count money, processing information better.  ROS: limited due to language/communication    Objective: Vital Signs: Blood pressure 118/68, pulse 78, temperature 98.9 F (37.2 C), temperature source Oral, resp. rate 19, height 5\' 7"  (1.702 m), weight 72.1 kg, SpO2 99 %. No results found. Recent Labs    10/27/18 1129  WBC 18.0*  HGB 11.9*  HCT 34.6*  PLT 255   Recent Labs    10/27/18 0659  NA 133*  K 4.3  CL 97*  CO2 25  GLUCOSE 100*  BUN 23*  CREATININE 0.81  CALCIUM 9.2    Physical Exam: BP 118/68 (BP Location: Left Arm)   Pulse 78   Temp 98.9 F (37.2 C) (Oral)   Resp 19   Ht 5\' 7"  (1.702 m)   Wt 72.1 kg   SpO2 99%   BMI 24.90 kg/m  Constitutional: No distress . Vital signs reviewed. HEENT: EOMI, oral membranes moist Neck: supple Cardiovascular: RRR without murmur. No JVD    Respiratory: CTA Bilaterally without wheezes or rales. Normal effort    GI: BS +, non-tender, non-distended  Skin: crani site intact. Psych: flat Musc: No edema in extremities.  No tenderness in extremities. Neuro: Alert. Does not engage. Does make eye contact Motor: Limited MMT d/t language. Sensed pain L>R, not following commands, intermittent spont movement LLE and LUE ongoing.=engages a little quicker. MAS 1/4 RUE   Assessment/Plan: 1. 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  Physiatrist is providing close team supervision and 24 hour management of active medical problems listed below.  Physiatrist and rehab team continue to assess barriers to discharge/monitor patient progress toward functional and medical goals  Care Tool:  Bathing    Body parts bathed by  patient: Right arm, Chest, Abdomen, Left upper leg, Right upper leg   Body parts bathed by helper: Left arm, Front perineal area, Buttocks, Right upper leg, Left upper leg, Right lower leg, Left lower leg, Face     Bathing assist Assist Level: Maximal Assistance - Patient 24 - 49%     Upper Body Dressing/Undressing Upper body dressing   What is the patient wearing?: Pull over shirt    Upper body assist Assist Level: Moderate Assistance - Patient 50 - 74%    Lower Body Dressing/Undressing Lower body dressing      What is the patient wearing?: Pants, Incontinence brief     Lower body assist Assist for lower body dressing: Total Assistance - Patient < 25%     Toileting Toileting    Toileting assist Assist for toileting: Dependent - Patient 0%     Transfers Chair/bed transfer  Transfers assist  Chair/bed transfer activity did not occur: Safety/medical concerns  Chair/bed transfer assist level: 2 Helpers     Locomotion Ambulation   Ambulation assist   Ambulation activity did not occur: Safety/medical concerns          Walk 10 feet activity   Assist  Walk 10 feet activity did not occur: Safety/medical concerns        Walk 50 feet activity   Assist Walk 50 feet with 2 turns activity did not occur: Safety/medical concerns         Walk 150 feet activity   Assist Walk 150  feet activity did not occur: Safety/medical concerns         Walk 10 feet on uneven surface  activity   Assist Walk 10 feet on uneven surfaces activity did not occur: Safety/medical concerns         Wheelchair     Assist Will patient use wheelchair at discharge?: (TBD)   Wheelchair activity did not occur: Safety/medical concerns         Wheelchair 50 feet with 2 turns activity    Assist    Wheelchair 50 feet with 2 turns activity did not occur: Safety/medical concerns       Wheelchair 150 feet activity     Assist Wheelchair 150 feet activity  did not occur: Safety/medical concerns          Medical Problem List and Plan: 1.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   Pathology report remains pending on 10/12  Continue CIR PT, OT, SLP   -PRAFO, WHO at night   2. Antithrombotics: -DVT/anticoagulation:Subcutaneous heparin initiated 10/16/2018 -antiplatelet therapy: N/A 3. Pain Management:Tylenol as needed 4. Mood:Provide emotional support -antipsychotic agents: N/A 5. Neuropsych: This patientis not of making decisions on hisown behalf. 6. Skin/Wound Care:Routine skin checks 7. Fluids/Electrolytes/Nutrition:Routine in and outs   -BUN/Cr sl elevated, but stable to improved  -encourage PO, recheck later this week 8. Dysphagia. Dysphasia #2 thin liquids. Follow-up speech therapy  Advance diet as tolerated 9. 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 Constipation  Bowel meds as necessary 11. Spasticity  Baclofen 5mg  2 times daily started on 10/9---increased to TID 10/13 12. Hiccups  Baclofen as needed  Improved 13.  Steroid-induced hyperglycemia  Continue to monitor with steroid wean 14.  Hyponatremia  Sodium 133, stable 10/12  Continue to monitor 15.  Transaminitis  LFTs remain elevated--but stable to improved 10/12 16.  Leukocytosis-likely steroid-induced  WBCs 18.0 on 10/7, and again 10/12  Afebrile, no signs of infection  -recheck later this week or next      LOS: 8 days A FACE TO FACE EVALUATION WAS PERFORMED  Meredith Staggers 10/29/2018, 8:19 AM

## 2018-10-29 NOTE — Progress Notes (Signed)
Physical Therapy Session Note  Patient Details  Name: Gregory Moreno MRN: [id number] Date of Birth: 09-29-1995  Today's Date: 10/29/2018 PT Individual Time: 1622-1705 PT Individual Time Calculation (min): 43 min   Short Term Goals: Week 2:  PT Short Term Goal 1 (Week 2): Pt will consistently complete bed mobilty with max assist +1. PT Short Term Goal 2 (Week 2): Pt will transfer bed<>w/c with max assist +1. PT Short Term Goal 3 (Week 2): Pt will demonstrate dynamic sitting balance with max assist.  Skilled Therapeutic Interventions/Progress Updates:    Pt supine in bed upon PT arrival, pt's dad present in the room as well, pt unable to report pain. Donned shoes total assist. Pt performed rolling from supine>R sidelying with total assist, cues to bring L UE across body and facilitation for LE placement. R sidelying>sitting with total assist this session to bring LEs off the bed and elevate trunk. Pt seated EOB worked on trunk control, upright posture and UE weightbearing to perform sidesitting on elbow<>sitting x 3 in each direction with total assist from R sidesit<>sit and max assist with activation/pushing through L UE from L sidesitting<>sit. Pt performed slideboard transfer from bed>TIS w/c with total assist, pt able to initiate scooting but unable to move hips without total assist. Pt transported to the gym. Pt used standing frame this session x 2 trials with therapist facilitating hip/trunk extension and midline, unable to fully extend knees. Pt seated in TIS worked on L LE activation and use to kick and roll a ball on the floor, slow to initiate but able to produce meaningful movements. Pt seated in TIS w/c performed anterior/posterior weightshifting of the trunk x 6 with B UE support on therapists knees, working on trunk control. Pt transported back to room and left in TIS w/c in care of dad.    Therapy Documentation Precautions:  Precautions Precautions: Fall Precaution Comments: L  crani Restrictions Weight Bearing Restrictions: No    Therapy/Group: Individual Therapy   Gregory Moreno, PT, DPT, CSRS 10/29/18  12:07 PM    [place] 10/29/2018, 12:06 PM

## 2018-10-30 ENCOUNTER — Inpatient Hospital Stay (HOSPITAL_COMMUNITY): Payer: 59

## 2018-10-30 ENCOUNTER — Inpatient Hospital Stay (HOSPITAL_COMMUNITY): Payer: 59 | Admitting: Speech Pathology

## 2018-10-30 LAB — SURGICAL PATHOLOGY

## 2018-10-30 MED ORDER — AMANTADINE HCL 100 MG PO CAPS
100.0000 mg | ORAL_CAPSULE | Freq: Two times a day (BID) | ORAL | Status: DC
  Administered 2018-10-30 – 2018-11-27 (×57): 100 mg via ORAL
  Filled 2018-10-30 (×58): qty 1

## 2018-10-30 NOTE — Progress Notes (Signed)
Social Work Patient ID: Gregory Moreno, male   DOB: 05/01/1995, 23 y.o.   MRN: [id number]   Have reviewed team conference with parents and they are aware and agreeable with ELS of 3-4 weeks and mod assist goals overall.  Both remain very involved, concerned and anxious to here path report.  Will continue to follow for support needs.  Naelle Diegel, LCSW

## 2018-10-30 NOTE — Progress Notes (Signed)
Physical Therapy Session Note  Patient Details  Name: Gregory Moreno MRN: [id number] Date of Birth: 25-Feb-1995  Today's Date: 10/30/2018 PT Individual Time: 1305-1430 PT Individual Time Calculation (min): 85 min   Short Term Goals: Week 2:  PT Short Term Goal 1 (Week 2): Pt will consistently complete bed mobilty with max assist +1. PT Short Term Goal 2 (Week 2): Pt will transfer bed<>w/c with max assist +1. PT Short Term Goal 3 (Week 2): Pt will demonstrate dynamic sitting balance with max assist.  Skilled Therapeutic Interventions/Progress Updates:    Pt seated in TIS w/c upon PT arrival, pt eating lunch with assist from his mom. Pt worked on self feeding using L UE this session to finish lunch. Pt transported to the gym for therapy tx, unable to report pain, no evidence of pain throughout session. Pt performed slideboard transfer from TIS<>mat with total +2 assist in each direction this session, pt initiates scooting with activation through L LE pushing and able to place L UE on arm rest/push for transfer however unable to perform scooting without total assist. Pt worked on seated balance this session with therapist behind pt facilitating increased trunk extension, also facilitating weightbearing through R UE on mat while performing reaching task with L UE overhead and across body, x 1 trial sitting on flat mat and x 1 trial sitting on red wedge in order to facilitate anterior pelvic tilt and increased lumbar extension. Sitting edge of mat pt worked on core activation to perform partial sit ups with max assist, pt reclined back on therapist and then working to reach with L UE and sit up x 10 forward partial sit ups, x 5 oblique partial sit ups to R reaching across with L UE with max assist, x 5 oblique partial sit ups towards L with total assist. Pt transferred sitting>L sidelying and then L sidelying>supine hookying with total assist +2. In hooklying therapist performed lumbar rotation stretching  by bringing LEs from side to side. Pt then transferred to R sidelying with cues for reaching across with L UE to facilitate upper trunk rotation, with cues and increased time pt able to initiate L forward pelvic rotation. Pt performed x 5 partial rolling from sidelying<>rolling back 45 degrees working on core activation, max-total assist. In R sidelying pt performed AAROM of L LE for flexion/extension x 10. Pt transferred from R sidelying to prone this session with wedge and pillow propped under chest with total +2 assist for positioning. In prone position pt able to keep head off mat midline cervical rotation with neck extended to neutral without assist, active assist for increased cervical and upper thoracic extension. While prone with pillow/wedge under chest pt performed x 5 reaches with L UE for bean bags with active assist. Pt transferred back to sidelying and supine total assist. Supine>L sidelying total assist and back to sitting with total +2 assist. Pt performed slideboard transfer back to w/c with total assist and transported back to room, left in w/c with chair alarm set and his mother present.   Therapy Documentation Precautions:  Precautions Precautions: Fall Precaution Comments: L crani Restrictions Weight Bearing Restrictions: No    Therapy/Group: Individual Therapy  Netta Corrigan, PT, DPT, CSRS 10/30/2018, 8:09 AM

## 2018-10-30 NOTE — Progress Notes (Signed)
Speech Language Pathology Daily Session Note  Patient Details  Name: Gregory Moreno MRN: [id number] Date of Birth: 22-May-1995  Today's Date: 10/30/2018  Session 1: SLP Individual Time: [id number] SLP Individual Time Calculation (min): 30 min   Session 2: SLP Individual Time: [id number] SLP Individual Time Calculation (min): 30 min  Short Term Goals: Week 2: SLP Short Term Goal 1 (Week 2): Patient will consume current diet with minimal overt s/s of aspiration with overall Min A verbal and visual cues for use of compensatory strategies. SLP Short Term Goal 2 (Week 2): Patient will vocalize on command in 25% of opportunities with Max multimodal cues. SLP Short Term Goal 3 (Week 2): Patient will establish some form of basic communication to indicate yes/no in regards to wants/needs with Max A multimodal cues in 75% of opportunities SLP Short Term Goal 4 (Week 2): Patient will follow 1-step commands in 50% of opportunities with Max multimodal cues. SLP Short Term Goal 5 (Week 2): Patient will demonstrate sustained attention to tasks for ~10 minutes with Max A multimodal cues for redirection. SLP Short Term Goal 6 (Week 2): Patient will initiate tasks in 75% of opportunitied with Max A multimodal cues.  Skilled Therapeutic Interventions:  Session 1: Skilled treatment session focused on cognitive goals. SLP facilitated session by initially providing Min-Mod A verbal cues for patient to initiate scooping of food and bringing it to his mouth, however, as patient fatigued, patient required Max-Total A. Patient utilized yes/no written aids in 100% of opportunities and initiate a thumbs up in response to a statement made by clinician. Patient appeared to attempt verbalization by opening his mouth and utilizing a large breath, however, unsuccessful. Patient left upright in bed with RN present. Continue with current plan of care.   Session 2: Skilled treatment session focused on speech goals. Patient  indicated that he had a headache and was able to rate it by pointing at a scale of numbers 1-10. RN aware and administered medications. SLP facilitated session by providing Max verbal, visual and tactile cues for use of a "big breath" in order to blow bubbles and facilitate movement of oral musculature on command. Patient was able to round his lips but unable to produce a strong enough breath despite multiple attempts. Patient left upright in wheelchair with mom present. Continue with current plan of care.   Pain  Session 1: No/Denies Pain   Session 2: 7/10, headache. RN aware and administered medications  Therapy/Group: Individual Therapy  Juliauna Stueve 10/30/2018, 12:25 PM

## 2018-10-30 NOTE — Progress Notes (Signed)
[facility] PHYSICAL MEDICINE & REHABILITATION PROGRESS NOTE  Subjective/Complaints: Pt working on breakfast when I arrived. No new problems overnight  ROS: limited due to language/communication    Objective: Vital Signs: Blood pressure 137/73, pulse 90, temperature 98 F (36.7 C), temperature source Oral, resp. rate 18, height 5\' 7"  (1.702 m), weight 72.1 kg, SpO2 97 %. No results found. Recent Labs    10/27/18 1129  WBC 18.0*  HGB 11.9*  HCT 34.6*  PLT 255   No results for input(s): NA, K, CL, CO2, GLUCOSE, BUN, CREATININE, CALCIUM in the last 72 hours.  Physical Exam: BP 137/73 (BP Location: Left Arm)   Pulse 90   Temp 98 F (36.7 C) (Oral)   Resp 18   Ht 5\' 7"  (1.702 m)   Wt 72.1 kg   SpO2 97%   BMI 24.90 kg/m  Constitutional: No distress . Vital signs reviewed. HEENT: EOMI, oral membranes moist Neck: supple Cardiovascular: RRR without murmur. No JVD    Respiratory: CTA Bilaterally without wheezes or rales. Normal effort    GI: BS +, non-tender, non-distended  Skin: crani site intact. Psych: flat, makes eye contact Musc: No edema in extremities.  No tenderness in extremities. Neuro: Alert. Does not engage. Does make eye contact Motor: Limited MMT d/t language. Sensed pain L>R, slow to follow commands. Was feeding himself eggs with extra time.  MAS tr to 1/4 RUE   Assessment/Plan: 1. 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  Physiatrist is providing close team supervision and 24 hour management of active medical problems listed below.  Physiatrist and rehab team continue to assess barriers to discharge/monitor patient progress toward functional and medical goals  Care Tool:  Bathing    Body parts bathed by patient: Right arm, Chest, Abdomen, Left upper leg, Right upper leg, Face   Body parts bathed by  helper: Left arm, Front perineal area, Buttocks, Right upper leg, Left upper leg, Right lower leg, Left lower leg     Bathing assist Assist Level: Maximal Assistance - Patient 24 - 49%     Upper Body Dressing/Undressing Upper body dressing   What is the patient wearing?: Pull over shirt    Upper body assist Assist Level: Moderate Assistance - Patient 50 - 74%    Lower Body Dressing/Undressing Lower body dressing      What is the patient wearing?: Pants, Incontinence brief     Lower body assist Assist for lower body dressing: 2 Helpers     Toileting Toileting    Toileting assist Assist for toileting: Dependent - Patient 0%     Transfers Chair/bed transfer  Transfers assist  Chair/bed transfer activity did not occur: Safety/medical concerns  Chair/bed transfer assist level: 2 Helpers     Locomotion Ambulation   Ambulation assist   Ambulation activity did not occur: Safety/medical concerns          Walk 10 feet activity   Assist  Walk 10 feet activity did not occur: Safety/medical concerns        Walk 50 feet activity   Assist Walk 50 feet with 2 turns activity did not occur: Safety/medical concerns         Walk 150 feet activity   Assist Walk 150 feet activity did not occur: Safety/medical concerns         Walk 10 feet on uneven surface  activity  Assist Walk 10 feet on uneven surfaces activity did not occur: Safety/medical concerns         Wheelchair     Assist Will patient use wheelchair at discharge?: (TBD)   Wheelchair activity did not occur: Safety/medical concerns         Wheelchair 50 feet with 2 turns activity    Assist    Wheelchair 50 feet with 2 turns activity did not occur: Safety/medical concerns       Wheelchair 150 feet activity     Assist Wheelchair 150 feet activity did not occur: Safety/medical concerns          Medical Problem List and Plan: 1.Right greater than left-sided  weaknesssecondary to intracerebral hemorrhage with intracranial tumor.Surgery was done emergently due to signs of herniation due to notes.Status post left craniotomy for hematoma evacuation and tumor resection 10/13/2018.   Pathology report remains pending on 10/15  Continue CIR PT, OT, SLP   -PRAFO, WHO at night   2. Antithrombotics: -DVT/anticoagulation:Subcutaneous heparin initiated 10/16/2018 -antiplatelet therapy: N/A 3. Pain Management:Tylenol as needed 4. Mood:Provide emotional support -antipsychotic agents: N/A 5. Neuropsych: This patientis not of making decisions on hisown behalf. 6. Skin/Wound Care:Routine skin checks 7. Fluids/Electrolytes/Nutrition:Routine in and outs   -BUN/Cr sl elevated, but stable to improved  -encourage PO, recheck later this week 8. Dysphagia. Dysphasia #2 thin liquids. Follow-up speech therapy  Advance diet as tolerated 9. 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 Constipation  Bowel meds as necessary 11. Spasticity  Baclofen 5mg  2 times daily started on 10/9---increased to TID 10/13 12. Hiccups  Baclofen as needed  Improved 13.  Steroid-induced hyperglycemia  Continue to monitor with steroid wean 14.  Hyponatremia  Sodium 133, stable 10/12  Continue to monitor 15.  Transaminitis  LFTs remain elevated--but stable to improved 10/12 16.  Leukocytosis-likely steroid-induced  WBCs 18.0 on 10/7, and again 10/12  Afebrile, no signs of infection  -recheck Monday      LOS: 9 days A FACE TO FACE EVALUATION WAS PERFORMED  Meredith Staggers 10/30/2018, 9:17 AM

## 2018-10-30 NOTE — Progress Notes (Signed)
Occupational Therapy Session Note  Patient Details  Name: Gregory Moreno MRN: [id number] Date of Birth: March 30, 1995  Today's Date: 10/30/2018 OT Individual Time: 1030-1130 OT Individual Time Calculation (min): 60 min    Short Term Goals: Week 1:  OT Short Term Goal 1 (Week 1): Pt will sit EOB/EOM with MOD A during functional dynamic sitting balance task OT Short Term Goal 1 - Progress (Week 1): Not met OT Short Term Goal 2 (Week 1): Pt will sit to stand with MAX A of 1 in stedy OT Short Term Goal 2 - Progress (Week 1): Met OT Short Term Goal 3 (Week 1): Pt will initate washing face with VC only OT Short Term Goal 3 - Progress (Week 1): Met OT Short Term Goal 4 (Week 1): Pt will locate toothbrush with mod multimodal cues for improved visual scanning OT Short Term Goal 4 - Progress (Week 1): Met OT Short Term Goal 5 (Week 1): Pt will completes 1/4 steps of UB dressing OT Short Term Goal 5 - Progress (Week 1): Met  Skilled Therapeutic Interventions/Progress Updates:    1:1. Pt received in bed with no indication of pain. Pt with increased flexor tone this date and associated movement in RUE when using LUE volitionally (I.e. when waving with L, R elbow flexes). Pt requires max-total A to roll after OT places LEs in hooklying as +2 advances pants past hips. Pt requires total A for SBT +2 with facilitation of posture and countin for initiation. Pt requires max VC hemi dressing to don shirt and MOD A overall for threading RUE and pulling down back. To work on anterior pelvic tilt, initiation and trunk control, Pt sits EOB with BUE on OTs knees while OT is sitting on rolling stool. +2 (OT student) faciltiates WB and scapular pro/retraction as pt pushes and pulls OT forward/backwards with trunk flexion/ext to improve pelvic tilt. Seated EOB, pt reaches R for WB through wrist to obtain clothes pin with LUE and shift back to midline and upright to place on basketball hoop net. Pt able to maintain midline  after placement of UEs on EOM and tactile cues with mirror for visual feedback. Exited session with pt seated in TIS, call light in reach, belt alarm on and al needs met  Therapy Documentation Precautions:  Precautions Precautions: Fall Precaution Comments: L crani Restrictions Weight Bearing Restrictions: No General:   Vital Signs:   Pain:   ADL: ADL Grooming: Maximal assistance Where Assessed-Grooming: Sitting at sink, Wheelchair Upper Body Bathing: Maximal assistance Where Assessed-Upper Body Bathing: Sitting at sink, Wheelchair Lower Body Bathing: Maximal assistance Where Assessed-Lower Body Bathing: Sitting at sink, Wheelchair Upper Body Dressing: Maximal assistance Where Assessed-Upper Body Dressing: Sitting at sink, Wheelchair Lower Body Dressing: Maximal assistance Where Assessed-Lower Body Dressing: Sitting at sink, Wheelchair Toileting: Unable to assess Toilet Transfer: Dependent(+2) Armed forces technical officer Method: Charlaine Dalton) Vision   Perception    Praxis   Exercises:   Other Treatments:     Therapy/Group: Individual Therapy  Tonny Branch 10/30/2018, 11:40 AM

## 2018-10-30 NOTE — Progress Notes (Signed)
Patient complained of headache level 7 of 10 on number scale. Tylenol given as ordered. Pt acknowledged he is tired from therapy., using yes/no paper for answering question. Patient with scant bleeding from nares noted after patient picked an old clot out of nares. Gregory Moreno Jermain Curt

## 2018-10-31 ENCOUNTER — Inpatient Hospital Stay (HOSPITAL_COMMUNITY): Payer: 59 | Admitting: Speech Pathology

## 2018-10-31 ENCOUNTER — Inpatient Hospital Stay (HOSPITAL_COMMUNITY): Payer: 59 | Admitting: Physical Therapy

## 2018-10-31 ENCOUNTER — Inpatient Hospital Stay (HOSPITAL_COMMUNITY): Payer: 59

## 2018-10-31 DIAGNOSIS — R519 Headache, unspecified: Secondary | ICD-10-CM

## 2018-10-31 DIAGNOSIS — R4781 Slurred speech: Secondary | ICD-10-CM

## 2018-10-31 DIAGNOSIS — R202 Paresthesia of skin: Secondary | ICD-10-CM

## 2018-10-31 NOTE — Consult Note (Signed)
[facility] Consult Note  Patient Care Team: [facility], [facility] as PCP - General (Family Medicine)  CHIEF COMPLAINTS/PURPOSE OF CONSULTATION:  Brain Tumor  HISTORY OF PRESENTING ILLNESS:  Gregory Moreno 23 y.o. male presented initially early AM of 10/13/18 with sudden onset headache, garbled speech, right sided numbness.  Symptoms evolved rapidly, and intracranial hemorrhage was demonstrated within the left temporal lobe.  He went for urgent evacuation and tumor resection that day, and subsequently developed severe neurologic sequelea of herniation and dominant hemisphere surgical debulking.  He has made good progress over the past 20 days; at this time he is awake and somewhat interactive, has some use of his left side.  He is not able to use words at all, and the right side is non-functioning.  No onset of seizures or new neurologic deficits have been appreciated during his rehabilitation stay.    MEDICAL HISTORY:  History reviewed. No pertinent past medical history.  SURGICAL HISTORY: Past Surgical History:  Procedure Laterality Date  . CRANIOTOMY Left 10/13/2018   Procedure: LEFT CRANIOTOMY FOR TUMOR RESECTION;  Surgeon: Judith Part, MD;  Location: [facility];  Service: Neurosurgery;  Laterality: Left;    SOCIAL HISTORY: Social History   Socioeconomic History  . Marital status: Single    Spouse name: Not on file  . Number of children: Not on file  . Years of education: Not on file  . Highest education level: Not on file  Occupational History  . Not on file  Social Needs  . Financial resource strain: Not on file  . Food insecurity    Worry: Not on file    Inability: Not on file  . Transportation needs    Medical: Not on file    Non-medical: Not on file  Tobacco Use  . Smoking status: Never Smoker  . Smokeless tobacco: Never Used  Substance and Sexual Activity  . Alcohol use: Not on file  . Drug use: Not on file  . Sexual  activity: Not on file  Lifestyle  . Physical activity    Days per week: Not on file    Minutes per session: Not on file  . Stress: Not on file  Relationships  . Social Herbalist on phone: Not on file    Gets together: Not on file    Attends religious service: Not on file    Active member of club or organization: Not on file    Attends meetings of clubs or organizations: Not on file    Relationship status: Not on file  . Intimate partner violence    Fear of current or ex partner: Not on file    Emotionally abused: Not on file    Physically abused: Not on file    Forced sexual activity: Not on file  Other Topics Concern  . Not on file  Social History Narrative  . Not on file    FAMILY HISTORY: History reviewed. No pertinent family history.  ALLERGIES:  has No Known Allergies.  MEDICATIONS:  Current Facility-Administered Medications  Medication Dose Route Frequency Provider Last Rate Last Dose  . acetaminophen (TYLENOL) tablet 650 mg  650 mg Oral Q4H PRN Cathlyn Parsons, PA-C   650 mg at 10/31/18 1228   Or  . acetaminophen (TYLENOL) suppository 650 mg  650 mg Rectal Q4H PRN Angiulli, Lavon Paganini, PA-C      . amantadine (SYMMETREL) capsule 100 mg  100 mg Oral BID WC Naaman Plummer,  Celesta Gentile, MD   100 mg at 10/31/18 1228  . baclofen (LIORESAL) 10 mg/mL oral suspension 5 mg  5 mg Oral TID PRN Angiulli, Lavon Paganini, PA-C      . baclofen (LIORESAL) tablet 5 mg  5 mg Oral TID Meredith Staggers, MD   5 mg at 10/31/18 [id number]  . bisacodyl (DULCOLAX) suppository 10 mg  10 mg Rectal Daily PRN Cathlyn Parsons, PA-C   10 mg at 10/22/18 3202  . dexamethasone (DECADRON) tablet 1 mg  1 mg Oral Q12H Meredith Staggers, MD   1 mg at 10/31/18 0810  . escitalopram (LEXAPRO) tablet 5 mg  5 mg Oral QHS Alger Simons T, MD   5 mg at 10/30/18 1956  . heparin injection 5,000 Units  5,000 Units Subcutaneous Q8H Cathlyn Parsons, PA-C   Stopped at 10/29/18 1503  . methylphenidate (RITALIN) tablet  10 mg  10 mg Oral BID WC Meredith Staggers, MD   10 mg at 10/31/18 1228  . ondansetron (ZOFRAN) tablet 4 mg  4 mg Oral Q4H PRN Angiulli, Lavon Paganini, PA-C       Or  . ondansetron [facility]) injection 4 mg  4 mg Intravenous Q4H PRN Angiulli, Lavon Paganini, PA-C      . pantoprazole sodium (PROTONIX) 40 mg/20 mL oral suspension 40 mg  40 mg Per Tube Daily Meredith Staggers, MD   40 mg at 10/31/18 0811  . senna-docusate (Senokot-S) tablet 1 tablet  1 tablet Oral BID Cathlyn Parsons, PA-C   1 tablet at 10/31/18 0809  . sodium chloride (OCEAN) 0.65 % nasal spray 1 spray  1 spray Each Nare PRN Angiulli, Lavon Paganini, PA-C      . sorbitol 70 % solution 30 mL  30 mL Oral Daily PRN Cathlyn Parsons, PA-C   30 mL at 10/31/18 0811    REVIEW OF SYSTEMS:   Limited by aphasia   PHYSICAL EXAMINATION: Vitals:   10/30/18 1938 10/31/18 0554  BP: 122/89 120/77  Pulse: 93 75  Resp: 18 18  Temp: 97.8 F (36.6 C) 98.5 F (36.9 C)  SpO2: 100% 99%   KPS: 50. General: Awake, non-communicative verbally. In wheelchair. Head: Craniotomy scar noted, dry and intact. EENT: No conjunctival injection or scleral icterus. Oral mucosa moist Lungs: Resp effort normal Cardiac: Regular rate and rhythm Abdomen: Soft, non-distended abdomen Skin: No rashes cyanosis or petechiae. Extremities: No clubbing or edema  NEUROLOGIC EXAM: Mental Status: Awake, alert, appears attentive to examiner with direct stimulation. Dense global aphasia, with some simple commands followed, although inconsistently. Cranial Nerves: Visual acuity is grossly normal. Visual fields are impaired to threat on right. Extra-ocular movements intact. No ptosis. Face is symmetric. Motor: Right side is plegic 0/5 throughout.  Left side antigravity but bradykinetic and incoordinated. Reflexes are symmetric, no pathologic reflexes present.Sensory: Intact to light touch and temperature Gait: Deferred   LABORATORY DATA:  I have reviewed the data as listed Lab  Results  Component Value Date   WBC 18.0 (H) 10/27/2018   HGB 11.9 (L) 10/27/2018   HCT 34.6 (L) 10/27/2018   MCV 88.0 10/27/2018   PLT 255 10/27/2018   Recent Labs    10/22/18 0501 10/24/18 0649 10/27/18 0659  NA 134* 134* 133*  K 4.7 4.1 4.3  CL 97* 100 97*  CO2 _0 GLUCOSE 126* 114* 100*  BUN 23* 25* 23*  CREATININE 0.89 0.83 0.81  CALCIUM 9.2 8.7* 9.2  GFRNONAA >60 >60 >  60  GFRAA >60 >60 >60  PROT 7.4 6.9 7.0  ALBUMIN 3.3* 3.0* 3.4*  AST 63* 58* 46*  ALT 77* 97* 91*  ALKPHOS 49 44 56  BILITOT 0.8 0.8 0.7    RADIOGRAPHIC STUDIES: I have personally reviewed the radiological images as listed and agreed with the findings in the report. Ct Angio Head W Or Wo Contrast  Result Date: 10/13/2018 CLINICAL DATA:  Cerebral hemorrhage EXAM: CT ANGIOGRAPHY HEAD AND NECK TECHNIQUE: Multidetector CT imaging of the head and neck was performed using the standard protocol during bolus administration of intravenous contrast. Multiplanar CT image reconstructions and MIPs were obtained to evaluate the vascular anatomy. Carotid stenosis measurements (when applicable) are obtained utilizing NASCET criteria, using the distal internal carotid diameter as the denominator. CONTRAST:  30m OMNIPAQUE IOHEXOL 350 MG/ML SOLN COMPARISON:  Head CT earlier today FINDINGS: CTA NECK FINDINGS Aortic arch: Normal Right carotid system: Normal when accounting for mild motion. Left carotid system: Normal when accounting for mild motion Vertebral arteries: Normal Skeleton: Negative Other neck: Negative Upper chest: Clear apical lungs Review of the MIP images confirms the above findings CTA HEAD FINDINGS Anterior circulation: Vessels are smooth and widely patent. Hypoplastic right A1 segment. Negative for aneurysm or vascular malformation. No convincing dot sign or abnormal vessels within the hematoma. Proximal left MCA vessels are displaced superiorly Posterior circulation: Vessels are smooth and widely patent. No  branch occlusion or aneurysm. Venous sinuses: Patent as permitted by contrast timing. Anatomic variants: As above Delayed phase: When densitometry of the periphery is compared to the noncontrast study there is no definite enhancement. Brain MRI with contrast is currently underway. Review of the MIP images confirms the above findings IMPRESSION: No vascular explanation for the left temporal hematoma. No aneurysm or visible vascular malformation. Major dural sinuses are patent. Electronically Signed   By: JMonte FantasiaM.D.   On: 10/13/2018 07:17   Ct Head Wo Contrast  Addendum Date: 10/14/2018   ADDENDUM REPORT: 10/14/2018 04:00 ADDENDUM: Study discussed by telephone with RN ADutch Quintin the Neuro ICU on 10/14/2018 at 0355 hours. Electronically Signed   By: HGenevie AnnM.D.   On: 10/14/2018 04:00   Result Date: 10/14/2018 CLINICAL DATA:  23year old male postoperative day 1 left craniotomy for resection of suspected hemorrhagic tumor. EXAM: CT HEAD WITHOUT CONTRAST TECHNIQUE: Contiguous axial images were obtained from the base of the skull through the vertex without intravenous contrast. COMPARISON:  Preoperative brain MRI, CT head and CTA head and neck. FINDINGS: Brain: Left temporal lobe resection with a combination of gas and hemorrhage in and around the resection cavity, tracking toward the suprasellar cistern in the midline. Small volume intraventricular hemorrhage in the left temporal horn, atrium and occipital horn. No ventriculomegaly. There is also subdural appearing extra-axial hemorrhage underlying the craniotomy flap measuring 6-7 millimeters in thickness. See coronal image 29. Intracranial mass effect with 5-6 millimeters of rightward midline shift, minimally increased from the preoperative CT. Basilar cisterns remain patent. No superimposed acute cortically based infarct. Trace pneumocephalus along both anterior frontal convexities. Vascular: No suspicious intracranial vascular hyperdensity.  Skull: Left frontotemporal craniotomy. Stable otherwise. Sinuses/Orbits: Left tympanic cavity and mastoids remain clear. Mild left sphenoid sinus mucosal thickening is stable. Other sinuses and mastoids are clear. Other: Postoperative changes to the left scalp with combined scalp hematoma and soft tissue gas. Orbits soft tissues remain negative. IMPRESSION: 1. Interval left frontotemporal craniotomy and left temporal lobe resection with a left side subdural hematoma mostly underlying the  craniotomy flap, 6-7 mm in thickness. And a combination of gas and blood within the resection cavity. 2. Persistent intracranial mass effect with slightly increased rightward midline shift, now 5-6 mm. Basilar cisterns remain patent. 3. Trace left lateral intraventricular hemorrhage with no ventriculomegaly. Electronically Signed: By: Genevie Ann M.D. On: 10/14/2018 03:54   Ct Angio Neck W Or Wo Contrast  Result Date: 10/13/2018 CLINICAL DATA:  Cerebral hemorrhage EXAM: CT ANGIOGRAPHY HEAD AND NECK TECHNIQUE: Multidetector CT imaging of the head and neck was performed using the standard protocol during bolus administration of intravenous contrast. Multiplanar CT image reconstructions and MIPs were obtained to evaluate the vascular anatomy. Carotid stenosis measurements (when applicable) are obtained utilizing NASCET criteria, using the distal internal carotid diameter as the denominator. CONTRAST:  41m OMNIPAQUE IOHEXOL 350 MG/ML SOLN COMPARISON:  Head CT earlier today FINDINGS: CTA NECK FINDINGS Aortic arch: Normal Right carotid system: Normal when accounting for mild motion. Left carotid system: Normal when accounting for mild motion Vertebral arteries: Normal Skeleton: Negative Other neck: Negative Upper chest: Clear apical lungs Review of the MIP images confirms the above findings CTA HEAD FINDINGS Anterior circulation: Vessels are smooth and widely patent. Hypoplastic right A1 segment. Negative for aneurysm or vascular  malformation. No convincing dot sign or abnormal vessels within the hematoma. Proximal left MCA vessels are displaced superiorly Posterior circulation: Vessels are smooth and widely patent. No branch occlusion or aneurysm. Venous sinuses: Patent as permitted by contrast timing. Anatomic variants: As above Delayed phase: When densitometry of the periphery is compared to the noncontrast study there is no definite enhancement. Brain MRI with contrast is currently underway. Review of the MIP images confirms the above findings IMPRESSION: No vascular explanation for the left temporal hematoma. No aneurysm or visible vascular malformation. Major dural sinuses are patent. Electronically Signed   By: JMonte FantasiaM.D.   On: 10/13/2018 07:17   Ct Chest W Contrast  Result Date: 10/13/2018 CLINICAL DATA:  Hemorrhagic left temporal lobe brain mass. Staging evaluation. EXAM: CT CHEST, ABDOMEN, AND PELVIS WITH CONTRAST TECHNIQUE: Multidetector CT imaging of the chest, abdomen and pelvis was performed following the standard protocol during bolus administration of intravenous contrast. CONTRAST:  860mOMNIPAQUE IOHEXOL 300 MG/ML  SOLN COMPARISON:  None. FINDINGS: CT CHEST FINDINGS Cardiovascular: Normal heart size. No significant pericardial effusion/thickening. Great vessels are normal in course and caliber. No central pulmonary emboli. Mediastinum/Nodes: No discrete thyroid nodules. Unremarkable esophagus. No pathologically enlarged axillary, mediastinal or hilar lymph nodes. Triangular soft tissue with stippled internal fat in the anterior mediastinum is compatible with atrophic thymic tissue. Lungs/Pleura: No pneumothorax. No pleural effusion. No acute consolidative airspace disease, lung masses or significant pulmonary nodules. Musculoskeletal:  No aggressive appearing focal osseous lesions. CT ABDOMEN PELVIS FINDINGS Hepatobiliary: Normal liver with no liver mass. Normal gallbladder with no radiopaque cholelithiasis.  No biliary ductal dilatation. Pancreas: Normal, with no mass or duct dilation. Spleen: Normal size. No mass. Adrenals/Urinary Tract: Normal adrenals. Normal kidneys with no hydronephrosis and no renal mass. Normal bladder. Stomach/Bowel: Normal non-distended stomach. Normal caliber small bowel with no small bowel wall thickening. Normal appendix. There is questionable segmental wall thickening in the collapsed ascending colon (series 3/image 84). Otherwise normal large bowel, with no diverticulosis or acute pericolonic fat stranding. Vascular/Lymphatic: Normal caliber abdominal aorta. Patent portal, splenic, hepatic and renal veins. No pathologically enlarged lymph nodes in the abdomen or pelvis. Reproductive: Normal size prostate. Other: No pneumoperitoneum, ascites or focal fluid collection. Musculoskeletal: No aggressive appearing focal  osseous lesions. IMPRESSION: 1. Questionable nonspecific segmental wall thickening in the collapsed ascending colon, poorly evaluated without oral contrast. This finding is most likely artifactual due to underdistention given the patient's age. A follow-up CT abdomen/pelvis with oral and IV contrast may be considered when clinically feasible. If the patient has risk factors for colonic neoplasm, colonoscopy correlation may be considered. 2. Otherwise, no lymphadenopathy or other findings suspicious for neoplastic disease in the chest, abdomen or pelvis. Electronically Signed   By: Ilona Sorrel M.D.   On: 10/13/2018 10:19   Mr Jeri Cos YQ Contrast  Result Date: 10/14/2018 CLINICAL DATA:  Follow-up CNS neoplasm. EXAM: MRI HEAD WITHOUT AND WITH CONTRAST TECHNIQUE: Multiplanar, multiecho pulse sequences of the brain and surrounding structures were obtained without and with intravenous contrast. CONTRAST:  51m GADAVIST GADOBUTROL 1 MMOL/ML IV SOLN COMPARISON:  MR from yesterday FINDINGS: Brain: Left temporal hematoma decompression with moderate regional ischemia and edema. There is  epidural blood along the bone flap measuring 846mthickness. There are small foci of acute ischemia in the right globus pallidus and midbrain. Small foci of acute infarction along the cortex of the medial and posterior left occipital lobe. No detectable residual enhancement when allowing for T1 hyperintense blood products. There are foci of enhancement within the collection around the bone flap of uncertain significance. Midline shift measures 7 mm. Vascular: Normal flow voids are preserved Skull and upper cervical spine: Left-sided craniotomy with fluid and gas superficial to the bone flap. Sinuses/Orbits: Negative IMPRESSION: 1. Left temporal hematoma/mass decompression with moderate regional edema and ischemia. No worrisome residual enhancement. Follow-up will be useful in differentiating edematous brain from infiltrating mass. 2. Small acute infarcts are seen in the right globus pallidus and right midbrain. 3. Epidural blood products deep to the bone flap. Multifactorial midline shift which measures 7 mm. Electronically Signed   By: JoMonte Fantasia.D.   On: 10/14/2018 10:30   Mr BrJeri CosoMGontrast  Result Date: 10/13/2018 CLINICAL DATA:  Cerebral hemorrhage. EXAM: MRI HEAD WITHOUT AND WITH CONTRAST TECHNIQUE: Multiplanar, multiecho pulse sequences of the brain and surrounding structures were obtained without and with intravenous contrast. CONTRAST:  54m11mADAVIST GADOBUTROL 1 MMOL/ML IV SOLN COMPARISON:  None. FINDINGS: Brain: Known hematoma in the left temporal lobe with heterogeneous appearance from pockets of blood and soft tissue. The hemorrhagic area measures up to 4 cm in diameter, stable from prior. There is a rim of T2 hyper to isointense soft tissue which show signs of dense cellularity on diffusion imaging. Wispy enhancement is seen at intermittently along the posterior margin of the abnormality and at the anterior aspect of the hematoma. Even when accounting for compression by local mass effect  the cortex of the temporal lobe appears blurred and thickened, especially inferiorly. This infiltrative appearance favors a glioma. No second lesion is seen and there is no history of malignancy. Although there is enhancement is not a typical pattern of necrotic rim enhancement around the hemorrhagic area, and this could reflect a low-grade glioma. Would also expect more enhancement for a solitary metastasis. PXA and ganglioglioma are considered given the mild enhancement, location, and age. There is no cyst with nodule for these entities but this morphology could be obscured by the acute hemorrhage. PXA is also considered less likely given the absence of dural thickening. Oligodendroglioma can have this type of non necrotic enhancement pattern. No reported history of chronic seizure and no parenchymal calcifications by CT. No visible cortical vein thrombus. Midline shift measures  up to 7 mm. No herniation, infarct, or hydrocephalus. Vascular: Major flow voids and vascular enhancements are preserved, including the dural venous sinuses. 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 Given age and the mild enhancement pattern question an enhancing low-grade glioma, as above. Biopsy targeting may be more accurate after the hematoma has diminished. Electronically Signed   By: Monte Fantasia M.D.   On: 10/13/2018 08:31   Ct Abdomen Pelvis W Contrast  Result Date: 10/13/2018 CLINICAL DATA:  Hemorrhagic left temporal lobe brain mass. Staging evaluation. EXAM: CT CHEST, ABDOMEN, AND PELVIS WITH CONTRAST TECHNIQUE: Multidetector CT imaging of the chest, abdomen and pelvis was performed following the standard protocol during bolus administration of intravenous contrast. CONTRAST:  21m OMNIPAQUE IOHEXOL 300 MG/ML  SOLN COMPARISON:  None. FINDINGS: CT CHEST FINDINGS  Cardiovascular: Normal heart size. No significant pericardial effusion/thickening. Great vessels are normal in course and caliber. No central pulmonary emboli. Mediastinum/Nodes: No discrete thyroid nodules. Unremarkable esophagus. No pathologically enlarged axillary, mediastinal or hilar lymph nodes. Triangular soft tissue with stippled internal fat in the anterior mediastinum is compatible with atrophic thymic tissue. Lungs/Pleura: No pneumothorax. No pleural effusion. No acute consolidative airspace disease, lung masses or significant pulmonary nodules. Musculoskeletal:  No aggressive appearing focal osseous lesions. CT ABDOMEN PELVIS FINDINGS Hepatobiliary: Normal liver with no liver mass. Normal gallbladder with no radiopaque cholelithiasis. No biliary ductal dilatation. Pancreas: Normal, with no mass or duct dilation. Spleen: Normal size. No mass. Adrenals/Urinary Tract: Normal adrenals. Normal kidneys with no hydronephrosis and no renal mass. Normal bladder. Stomach/Bowel: Normal non-distended stomach. Normal caliber small bowel with no small bowel wall thickening. Normal appendix. There is questionable segmental wall thickening in the collapsed ascending colon (series 3/image 84). Otherwise normal large bowel, with no diverticulosis or acute pericolonic fat stranding. Vascular/Lymphatic: Normal caliber abdominal aorta. Patent portal, splenic, hepatic and renal veins. No pathologically enlarged lymph nodes in the abdomen or pelvis. Reproductive: Normal size prostate. Other: No pneumoperitoneum, ascites or focal fluid collection. Musculoskeletal: No aggressive appearing focal osseous lesions. IMPRESSION: 1. Questionable nonspecific segmental wall thickening in the collapsed ascending colon, poorly evaluated without oral contrast. This finding is most likely artifactual due to underdistention given the patient's age. A follow-up CT abdomen/pelvis with oral and IV contrast may be considered when clinically  feasible. If the patient has risk factors for colonic neoplasm, colonoscopy correlation may be considered. 2. Otherwise, no lymphadenopathy or other findings suspicious for neoplastic disease in the chest, abdomen or pelvis. Electronically Signed   By: JIlona SorrelM.D.   On: 10/13/2018 10:19   Dg Chest Port 1 View  Result Date: 10/15/2018 CLINICAL DATA:  Status post craniotomy EXAM: PORTABLE CHEST 1 VIEW COMPARISON:  10/13/2018 FINDINGS: The heart size and mediastinal contours are within normal limits. Both lungs are clear. The visualized skeletal structures are unremarkable. IMPRESSION: No acute abnormality of the lungs in AP portable projection. Electronically Signed   By: AEddie CandleM.D.   On: 10/15/2018 08:15   Dg Chest Port 1 View  Result Date: 10/13/2018 CLINICAL DATA:  Intubation. EXAM: PORTABLE CHEST 1 VIEW COMPARISON:  None. FINDINGS: An ETT terminates 6.3 cm above the carina and 6.7 cm below the thoracic inlet, in good position. The lungs are clear. The cardiomediastinal silhouette is unremarkable. No pneumothorax. IMPRESSION: The ETT is in good position.  No other abnormalities. Electronically Signed   By: DDorise BullionIII  M.D   On: 10/13/2018 20:07   Ct Head Code Stroke Wo Contrast  Result Date: 10/13/2018 CLINICAL DATA:  Code stroke. Sudden onset left-sided headache with right facial droop and right-sided weakness EXAM: CT HEAD WITHOUT CONTRAST TECHNIQUE: Contiguous axial images were obtained from the base of the skull through the vertex without intravenous contrast. COMPARISON:  None. FINDINGS: Brain: Patchy high-density hematoma within the left temporal lobe with upward mass effect on the basal ganglia. High-density hematoma portion measures up to 4.4 cm (this measurement includes areas of non high-density parenchyma or un clotted blood). There is a rim of high density which encircles the hematoma and may reflect an underlying mass or subacute hematoma. The entire abnormality  measures up to 6 cm in diameter. Local mass effect with midline shift measuring up to 4 mm. No entrapment. Vascular: No hyperdense vessel or unexpected calcification. Skull: Normal. Negative for fracture or focal lesion. Sinuses/Orbits: No acute finding. Other: Critical Value/emergent results were called by telephone at the time of interpretation on 10/13/2018 at 6:49 am to providerArora, who verbally acknowledged these results. ASPECTS [facility] Stroke Program Early CT Score) Not scored in this setting IMPRESSION: 1. Acute hematoma in the left temporal lobe which appears encapsulated by a high-density rim, suspected underlying mass or subacute hemorrhage. CTA and brain MRI are pending. 2. 4 mm of midline shift. Electronically Signed   By: Monte Fantasia M.D.   On: 10/13/2018 06:52   Vas [place] Lower Extremity Venous (dvt)  Result Date: 10/23/2018  Lower Venous Study Indications: Swelling.  Comparison Study: No prior study. Performing Technologist: Maudry Mayhew MHA, RDMS, RVT, RDCS  Examination Guidelines: A complete evaluation includes B-mode imaging, spectral Doppler, color Doppler, and power Doppler as needed of all accessible portions of each vessel. Bilateral testing is considered an integral part of a complete examination. Limited examinations for reoccurring indications may be performed as noted.  +---------+---------------+---------+-----------+----------+--------------+ RIGHT    CompressibilityPhasicitySpontaneityPropertiesThrombus Aging +---------+---------------+---------+-----------+----------+--------------+ CFV      Full           Yes      Yes                                 +---------+---------------+---------+-----------+----------+--------------+ SFJ      Full                                                        +---------+---------------+---------+-----------+----------+--------------+ FV Prox  Full                                                         +---------+---------------+---------+-----------+----------+--------------+ FV Mid   Full                                                        +---------+---------------+---------+-----------+----------+--------------+ FV DistalFull                                                        +---------+---------------+---------+-----------+----------+--------------+  PFV      Full                                                        +---------+---------------+---------+-----------+----------+--------------+ POP      Full           Yes      Yes                                 +---------+---------------+---------+-----------+----------+--------------+ PTV      Full                                                        +---------+---------------+---------+-----------+----------+--------------+ PERO     Full                                                        +---------+---------------+---------+-----------+----------+--------------+   +---------+---------------+---------+-----------+----------+--------------+ LEFT     CompressibilityPhasicitySpontaneityPropertiesThrombus Aging +---------+---------------+---------+-----------+----------+--------------+ CFV      Full           Yes      Yes                                 +---------+---------------+---------+-----------+----------+--------------+ SFJ      Full                                                        +---------+---------------+---------+-----------+----------+--------------+ FV Prox  Full                                                        +---------+---------------+---------+-----------+----------+--------------+ FV Mid   Full                                                        +---------+---------------+---------+-----------+----------+--------------+ FV DistalFull                                                         +---------+---------------+---------+-----------+----------+--------------+ PFV      Full                                                        +---------+---------------+---------+-----------+----------+--------------+   POP      Full           Yes      Yes                                 +---------+---------------+---------+-----------+----------+--------------+ PTV      Full                                                        +---------+---------------+---------+-----------+----------+--------------+ PERO     Full                                                        +---------+---------------+---------+-----------+----------+--------------+  Summary: Right: There is no evidence of deep vein thrombosis in the lower extremity. No cystic structure found in the popliteal fossa. Left: There is no evidence of deep vein thrombosis in the lower extremity. No cystic structure found in the popliteal fossa.  *See table(s) above for measurements and observations. Electronically signed by Monica Martinez MD on 10/23/2018 at 5:04:15 PM.    Final     ASSESSMENT & PLAN:  Brain Tumor  Pathology report was received today from Dr. Maisie Fus at 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  Today, with his father and mother, I extensively reviewed clinical and prognostic significance of his histology and our limited window into the tumor's genetic profiled to this point.  Although this is an aggressive and "incurable" type of cancer, atypical elements of his case (age, degree of hemorrhage, size of mass prior to symptom onset) in the correct light can be seen as encouraging.  It is possible that the tumor was lower grade and partially transformed and hemorrhaged... and that this focus of HGG was subsequently resected.  At the very least, we will need to send his slides to [facility] for whole exome and RNA sequencing to uncover genetic  signature and search for potentially targetable mutation.   Right now the recommendation will be to proceed with radiation therapy and concurrent Temodar once an adequate interval of improvement from rehabilitation has been reached.  Ideal start time for RT will be 4-6 weeks from craniotomy; if functional status is improving during that time we will likely hold off until the end of that period to allow for maximal recovery of functional status.       I think it would be beneficial to obtain a repeat MRI in the next 1-2 weeks to obtain clearer picture of residual mass and T2 signal abnormality related to infiltrative tumor, prior to radiation.  Start interval for RT will also depend on MRI results.  Will of course continue to follow, family has my contact information.    All questions were answered. The familyknows to call the clinic with any problems, questions or concerns.  The total time spent in the encounter was 80 minutes and more than 50% was on counseling and review of test results     Ventura Sellers, MD 10/31/2018 4:23 PM

## 2018-10-31 NOTE — Progress Notes (Signed)
Physical Therapy Session Note  Patient Details  Name: Gregory Moreno MRN: [id number] Date of Birth: Nov 03, 1995  Today's Date: 10/31/2018 PT Individual Time: 1105-1200 AND 1445-1515 PT Individual Time Calculation (min): 55 min AND 30 min   Short Term Goals: Week 2:  PT Short Term Goal 1 (Week 2): Pt will consistently complete bed mobilty with max assist +1. PT Short Term Goal 2 (Week 2): Pt will transfer bed<>w/c with max assist +1. PT Short Term Goal 3 (Week 2): Pt will demonstrate dynamic sitting balance with max assist.  Skilled Therapeutic Interventions/Progress Updates:   Session 1:  Pt in TIS and appears agreeable to therapy, no evidence of pain throughout session. Pt nonverbal throughout session as well, utilized "thumbs-up" and "thumbs down" to indicate yes/no answers, pt able to give answer ~50% of time w/ max encouragement/tactile cues.   Total assist w/c transport to/from therapy gym. Total assist +2 slide board transfer to/from edge of mat. Worked on static/dynamic sitting balance w/ LUE reaching tasks, emphasized L weight shifting/weight bearing and anterior trunk lean. Pt able to reach for horseshoes and place over stake on ground 100% of trials w/ min tactile, verbal, and manual cues at hips and shoulders for weight shifting. Posture and righting reactions improve w/ mirror for visual feedback, overall needed min assist-CGA for static sitting balance and max assist for dynamic sitting balance. Brief rest break reclined back on pillow, therapist providing passive cervical L rotation and L sidebending. Side crunches in seated to elbow, x5 on each side w/ max manual assist for pelvic weight shifting and tactile/manual cues to facilitate WB onto R elbow. Returned to [employer] and performed kinetron 3 min x3 @ level 90 cm/sec to work on BLE muscle activation, initiation of movement, and reciprocal movement pattern. Max tactile and verbal cues to engage L quad musculature, able to activate  ~50% ofd the time towards end of attempts, however needed total assist from therapist to perform kinetron. Made mulitple attempts at sit<>stands at rail at end of session, however suspect pt very fatigued, will re-attempt standing in afternoon session.   Returned to room and ended session in TIS, all needs in reach.   Session 2:  Pt in TIS and agreeable to therapy. Pt utilizing iPad to answer yes/no questions and to make his needs known. Total assist w/c transport to/from therapy gym. Performed sit<>stand in standing frame w/ max tactile, verbal, and manual cues for midline and to reach full upright. Pt w/ heavy R lateral lean and pushing forward. Pt indicated he was in pain in this position, returned to w/c and pt pointed to chest as pain site. After further yes/no questioning, pt indicated the standing frame hurt his chest and abdomen. Total assist +2 lateral scoot to edge of mat and worked on static sitting balance remainder of session while taking bites of ice cream (pt's choice was to eat ice cream while sitting up). After ~5 min of static sitting w/ min assist and verbal/tactile cues for midline, RN arrived and requesting pt return to room to meet w/ neuro-oncologist. Returned to room via w/c, ended session in TIS and in care of pt's dad.   Therapy Documentation Precautions:  Precautions Precautions: Fall Precaution Comments: L crani Restrictions Weight Bearing Restrictions: No Pain: Pain Assessment Pain Scale: 0-10 Pain Score: 7  Pain Location: Head Pain Descriptors / Indicators: Aching Patients Stated Pain Goal: 4 Pain Intervention(s): Medication (See eMAR)  Therapy/Group: Individual Therapy  Cressida Milford K Sharniece Gibbon 10/31/2018, 1:16 PM

## 2018-10-31 NOTE — Progress Notes (Signed)
Occupational Therapy Session Note  Patient Details  Name: Gregory Moreno MRN: [id number] Date of Birth: 1995-06-09  Today's Date: 10/31/2018 OT Individual Time: 0102-7253 OT Individual Time Calculation (min): 75 min    Short Term Goals: Week 2:  OT Short Term Goal 1 (Week 2): Pt will maintain sitting balance with R UE supported with Mod A OT Short Term Goal 2 (Week 2): Pt will follow one step commands with 75% accuracy OT Short Term Goal 3 (Week 2): Pt will complete log rolling in bed with min VC for hand placement OT Short Term Goal 4 (Week 2): Pt will initiate functional task with L UE with mod cueing  Skilled Therapeutic Interventions/Progress Updates:    1:1. Pt received in bed with breakfast set up from NT. Pt requires mod-max A to scoop food and min tactile cues to initiate hand to mouth exercursion. Pt requires min VC for smaller sips of liquids via straw. Pt with no overt s/s of aspiration and no pocketing noticed during meals. Pt able to indicate yes/no with signs and make choices of preferred clothing items with significantly less time this date.  Pt rolls with total A, but pt able to initate reaching with LUE across midline to bed rail 25% of time without physical A while rolling for clothing management. Pt completes total A of 1 (+2 present for safety) slide board transfer EOB>TIS with pt initiating reaching across board and to arm rest with mod VC. Pt dons shirt with max A d/t tightness of T shirt. OT installs elastic laces into shoes to decrease bilateral coordination demand of task and dons shoes with MAX HOH A with BLE into seated figure 4. Exited session with pt seated in TIS, belt alarm on call light in reach and all needs met.  Therapy Documentation Precautions:  Precautions Precautions: Fall Precaution Comments: L crani Restrictions Weight Bearing Restrictions: No General:   Vital Signs: Therapy Vitals Temp: 98.5 F (36.9 C) Pulse Rate: 75 Resp: 18 BP:  120/77 Patient Position (if appropriate): Lying Oxygen Therapy SpO2: 99 % O2 Device: Room Air Pain:   ADL: ADL Grooming: Maximal assistance Where Assessed-Grooming: Sitting at sink, Wheelchair Upper Body Bathing: Maximal assistance Where Assessed-Upper Body Bathing: Sitting at sink, Wheelchair Lower Body Bathing: Maximal assistance Where Assessed-Lower Body Bathing: Sitting at sink, Wheelchair Upper Body Dressing: Maximal assistance Where Assessed-Upper Body Dressing: Sitting at sink, Wheelchair Lower Body Dressing: Maximal assistance Where Assessed-Lower Body Dressing: Sitting at sink, Wheelchair Toileting: Unable to assess Toilet Transfer: Dependent(+2) Armed forces technical officer Method: Charlaine Dalton) Vision   Perception    Praxis   Exercises:   Other Treatments:     Therapy/Group: Individual Therapy  Tonny Branch 10/31/2018, 9:36 AM

## 2018-10-31 NOTE — Progress Notes (Signed)
Speech Language Pathology Daily Session Note  Patient Details  Name: Gregory Moreno MRN: [id number] Date of Birth: Nov 12, 1995  Today's Date: 10/31/2018  Session 1: SLP Individual Time: [id number] SLP Individual Time Calculation (min): 30 min   Session 2: SLP Individual Time: [id number] SLP Individual Time Calculation (min): 35 min  Short Term Goals: Week 2: SLP Short Term Goal 1 (Week 2): Patient will consume current diet with minimal overt s/s of aspiration with overall Min A verbal and visual cues for use of compensatory strategies. SLP Short Term Goal 2 (Week 2): Patient will vocalize on command in 25% of opportunities with Max multimodal cues. SLP Short Term Goal 3 (Week 2): Patient will establish some form of basic communication to indicate yes/no in regards to wants/needs with Max A multimodal cues in 75% of opportunities SLP Short Term Goal 4 (Week 2): Patient will follow 1-step commands in 50% of opportunities with Max multimodal cues. SLP Short Term Goal 5 (Week 2): Patient will demonstrate sustained attention to tasks for ~10 minutes with Max A multimodal cues for redirection. SLP Short Term Goal 6 (Week 2): Patient will initiate tasks in 75% of opportunitied with Max A multimodal cues.  Skilled Therapeutic Interventions:  Session 1: Skilled treatment session focused on communication goals. SLP facilitated session by providing extra time and Min A verbal and visual cues for patient to navigate his ipad and Max A verbal and visual cues for patient type at the word level while searching apps. SLP downloaded an "ADL daily living" communication board in which patient deleted pictures and phrases he did not need with extra time and Mod verbal cues. Patient left upright in wheelchair with father present. Continue with current plan of care.   Session 2: Skilled treatment session focused on communication goals. SLP facilitated session by providing extra time while patient navigated his  ipad to locate his communication board. Patient indicated he wanted "something else to eat," therefore, SLP provided patient with a magic cup. Patient initiated placing cup in his RUE but required steadying assist at times. Patient left with RN to complete ice cream. Continue with current plan of care.      Pain Pain Assessment Pain Scale: 0-10 Pain Score: 4  Pain Location: Head Pain Descriptors / Indicators: Aching Patients Stated Pain Goal: 4 Pain Intervention(s): Medication (See eMAR)  Therapy/Group: Individual Therapy  Gregory Moreno 10/31/2018, 3:06 PM

## 2018-10-31 NOTE — Progress Notes (Signed)
[place] PHYSICAL MEDICINE & REHABILITATION PROGRESS NOTE  Subjective/Complaints: Pt denies any problems or pain this morning---uses thumbs up. OT in room  ROS: limited due to language/communication    Objective: Vital Signs: Blood pressure 120/77, pulse 75, temperature 98.5 F (36.9 C), resp. rate 18, height 5\' 7"  (1.702 m), weight 72.1 kg, SpO2 99 %. No results found. No results for input(s): WBC, HGB, HCT, PLT in the last 72 hours. No results for input(s): NA, K, CL, CO2, GLUCOSE, BUN, CREATININE, CALCIUM in the last 72 hours.  Physical Exam: BP 120/77 (BP Location: Left Arm)   Pulse 75   Temp 98.5 F (36.9 C)   Resp 18   Ht 5\' 7"  (1.702 m)   Wt 72.1 kg   SpO2 99%   BMI 24.90 kg/m  Constitutional: No distress . Vital signs reviewed. HEENT: EOMI, oral membranes moist Neck: supple Cardiovascular: RRR without murmur. No JVD    Respiratory: CTA Bilaterally without wheezes or rales. Normal effort    GI: BS +, non-tender, non-distended  Skin: crani site intact. Psych: flat, makes eye contact Musc: No edema in extremities.  No tenderness in extremities. Neuro: Alert. Does not engage. Does make eye contact Motor: Limited MMT d/t language. Sensed pain L>R, slow to follow commands. Was feeding himself eggs with extra time.  MAS 1/4 R biceps   Assessment/Plan: 1. 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  Physiatrist is providing close team supervision and 24 hour management of active medical problems listed below.  Physiatrist and rehab team continue to assess barriers to discharge/monitor patient progress toward functional and medical goals  Care Tool:  Bathing    Body parts bathed by patient: Right arm, Chest, Abdomen, Left upper leg, Right upper leg, Face   Body parts bathed by helper: Left arm, Front perineal area,  Buttocks, Right upper leg, Left upper leg, Right lower leg, Left lower leg     Bathing assist Assist Level: Maximal Assistance - Patient 24 - 49%     Upper Body Dressing/Undressing Upper body dressing   What is the patient wearing?: Pull over shirt    Upper body assist Assist Level: Moderate Assistance - Patient 50 - 74%    Lower Body Dressing/Undressing Lower body dressing      What is the patient wearing?: Pants     Lower body assist Assist for lower body dressing: 2 Helpers     Toileting Toileting    Toileting assist Assist for toileting: Dependent - Patient 0%     Transfers Chair/bed transfer  Transfers assist  Chair/bed transfer activity did not occur: Safety/medical concerns  Chair/bed transfer assist level: 2 Helpers     Locomotion Ambulation   Ambulation assist   Ambulation activity did not occur: Safety/medical concerns          Walk 10 feet activity   Assist  Walk 10 feet activity did not occur: Safety/medical concerns        Walk 50 feet activity   Assist Walk 50 feet with 2 turns activity did not occur: Safety/medical concerns         Walk 150 feet activity   Assist Walk 150 feet activity did not occur: Safety/medical concerns         Walk 10 feet on uneven surface  activity   Assist Walk 10 feet on uneven surfaces activity did not occur: Safety/medical concerns  Wheelchair     Assist Will patient use wheelchair at discharge?: (TBD)   Wheelchair activity did not occur: Safety/medical concerns         Wheelchair 50 feet with 2 turns activity    Assist    Wheelchair 50 feet with 2 turns activity did not occur: Safety/medical concerns       Wheelchair 150 feet activity     Assist Wheelchair 150 feet activity did not occur: Safety/medical concerns          Medical Problem List and Plan: 1.Right greater than left-sided weaknesssecondary to intracerebral hemorrhage with  intracranial tumor.Surgery was done emergently due to signs of herniation due to notes.Status post left craniotomy for hematoma evacuation and tumor resection 10/13/2018.   Pathology now back and is grade IV glial--have reached out NS. I have not discussed with family or pt yet  Continue CIR PT, OT, SLP   -PRAFO, WHO at night  -more initiation noted by therapy team   2. Antithrombotics: -DVT/anticoagulation:Subcutaneous heparin initiated 10/16/2018 -antiplatelet therapy: N/A 3. Pain Management:Tylenol as needed 4. Mood:Provide emotional support -antipsychotic agents: N/A 5. Neuropsych: This patientis not of making decisions on hisown behalf. 6. Skin/Wound Care:Routine skin checks 7. Fluids/Electrolytes/Nutrition:Routine in and outs   -BUN/Cr sl elevated, but stable to improved  -encourage PO, recheck Monday 8. Dysphagia. Dysphasia #2 thin liquids. Follow-up speech therapy  Advance diet as tolerated 9. 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 Constipation  Bowel meds as necessary 11. Spasticity  Baclofen 5mg  2 times daily started on 10/9---increased to TID 10/13, ongoing tone but still easy ROM---no changes today 12. Hiccups  Baclofen as needed  Improved 13.  Steroid-induced hyperglycemia  Continue to monitor with steroid wean 14.  Hyponatremia  Sodium 133, stable 10/12  Continue to monitor 15.  Transaminitis  LFTs remain elevated--but stable to improved 10/12 16.  Leukocytosis-likely steroid-induced  WBCs 18.0 on 10/7, and again 10/12  Afebrile, no signs of infection  -recheck Monday      LOS: 10 days A FACE TO FACE EVALUATION WAS PERFORMED  Meredith Staggers 10/31/2018, 9:14 AM

## 2018-11-01 ENCOUNTER — Inpatient Hospital Stay (HOSPITAL_COMMUNITY): Payer: 59

## 2018-11-01 ENCOUNTER — Inpatient Hospital Stay (HOSPITAL_COMMUNITY): Payer: 59 | Admitting: Speech Pathology

## 2018-11-01 NOTE — Progress Notes (Signed)
[facility] PHYSICAL MEDICINE & REHABILITATION PROGRESS NOTE  Subjective/Complaints: Patient seen sitting up in bed this morning.  He is alert, but does not interact.  No reported issues overnight.  Discussed epistaxis with nursing and resuming heparin.  He, along with family was seen by neuro oncology yesterday, notes reviewed, appreciate assistance.  ROS: limited due to cognition   Objective: Vital Signs: Blood pressure 121/82, pulse 75, temperature 98.2 F (36.8 C), resp. rate 19, height 5\' 7"  (1.702 m), weight 72.1 kg, SpO2 97 %. No results found. No results for input(s): WBC, HGB, HCT, PLT in the last 72 hours. No results for input(s): NA, K, CL, CO2, GLUCOSE, BUN, CREATININE, CALCIUM in the last 72 hours.  Physical Exam: BP 121/82 (BP Location: Left Arm)   Pulse 75   Temp 98.2 F (36.8 C)   Resp 19   Ht 5\' 7"  (1.702 m)   Wt 72.1 kg   SpO2 97%   BMI 24.90 kg/m  Constitutional: No distress . Vital signs reviewed. HENT: Crani site C/D/I Improving with facial edema Eyes: Left eye ptosis improving. No discharge. Cardiovascular: No JVD. Respiratory: Normal effort.  No stridor. GI: Non-distended. Skin: Warm and dry.  Intact. Psych: Flat with limited engagement  Musc: No edema in extremities.  No tenderness in extremities. Neuro: Alert  Makes eye contact Motor: Limited MMT due to participation, seen limited movements of bilateral upper extremities,   Assessment/Plan: 1. 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  Physiatrist is providing close team supervision and 24 hour management of active medical problems listed below.  Physiatrist and rehab team continue to assess barriers to discharge/monitor patient progress toward functional and medical goals  Care Tool:  Bathing    Body parts bathed by patient: Right arm, Chest, Abdomen,  Left upper leg, Right upper leg, Face   Body parts bathed by helper: Left arm, Front perineal area, Buttocks, Right upper leg, Left upper leg, Right lower leg, Left lower leg     Bathing assist Assist Level: Maximal Assistance - Patient 24 - 49%     Upper Body Dressing/Undressing Upper body dressing   What is the patient wearing?: Pull over shirt    Upper body assist Assist Level: Moderate Assistance - Patient 50 - 74%    Lower Body Dressing/Undressing Lower body dressing      What is the patient wearing?: Pants     Lower body assist Assist for lower body dressing: 2 Helpers     Toileting Toileting    Toileting assist Assist for toileting: Dependent - Patient 0%     Transfers Chair/bed transfer  Transfers assist  Chair/bed transfer activity did not occur: Safety/medical concerns  Chair/bed transfer assist level: 2 Helpers     Locomotion Ambulation   Ambulation assist   Ambulation activity did not occur: Safety/medical concerns          Walk 10 feet activity   Assist  Walk 10 feet activity did not occur: Safety/medical concerns        Walk 50 feet activity   Assist Walk 50 feet with 2 turns activity did not occur: Safety/medical concerns         Walk 150 feet activity   Assist Walk 150 feet activity did not occur: Safety/medical concerns         Walk 10 feet on uneven surface  activity   Assist Walk  10 feet on uneven surfaces activity did not occur: Safety/medical concerns         Wheelchair     Assist Will patient use wheelchair at discharge?: (TBD)   Wheelchair activity did not occur: Safety/medical concerns         Wheelchair 50 feet with 2 turns activity    Assist    Wheelchair 50 feet with 2 turns activity did not occur: Safety/medical concerns       Wheelchair 150 feet activity     Assist Wheelchair 150 feet activity did not occur: Safety/medical concerns          Medical Problem List and  Plan: 1.Right greater than left-sided weaknesssecondary to intracerebral hemorrhage with GBM.Surgery was done emergently due to signs of herniation due to notes.Status post left crani for hematoma evacuation and tumor resection 10/13/2018.   Continue CIR  PRAFO, WHO at night  Neuro oncology with extensive discussion with patient and family yesterday regarding diagnosis and prognosis, appreciate assistance  2. Antithrombotics: -DVT/anticoagulation:Subcutaneous heparin initiated 10/16/2018 -antiplatelet therapy: N/A 3. Pain Management:Tylenol as needed 4. Mood:Provide emotional support -antipsychotic agents: N/A 5. Neuropsych: This patientis not of making decisions on hisown behalf. 6. Skin/Wound Care:Routine skin checks 7. Fluids/Electrolytes/Nutrition:Routine in and outs  8. Dysphagia. Dysphasia #2 thin liquids. Follow-up speech therapy  Advance diet as tolerated 9. 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 Constipation  Bowel meds as necessary 11. Spasticity  Baclofen 5mg  2 times daily started on 10/9---increased to TID 10/13 12. Hiccups  Baclofen as needed  Improved 13.  Steroid-induced hyperglycemia  Continue to monitor with steroid wean 14.  Hyponatremia  Sodium 133 on 10/12, labs ordered for Monday  Continue to monitor 15.  Transaminitis  LFTs remain elevated--but stable-slightly improved on 10/12 16.  Leukocytosis-likely steroid-induced  WBCs 18.0 on 10/12, labs ordered for Monday  Afebrile, no signs of infection   LOS: 11 days A FACE TO FACE EVALUATION WAS PERFORMED  Ankit Lorie Phenix 11/01/2018, 10:55 AM

## 2018-11-01 NOTE — Progress Notes (Signed)
Slept good. Condom cath in place to manage incontinence. Per previous report, LBM 10/13, PRN sorbitol given. No BM this shift. BS (+) X 4 quads. Right resting hand splint and Right PRAFO applied at HS. Foam dressing in place to right heel. Non verbal, but communicating with yes/no on paper or thumb up or down. Also using app on ipad to point at pictures. Doesn't call for assistance. No unsafe behaviors observed. Gregory Moreno A

## 2018-11-01 NOTE — Progress Notes (Signed)
Occupational Therapy Session Note  Patient Details  Name: SLOANE JUNKIN MRN: [id number] Date of Birth: Jul 05, 1995  Today's Date: 11/01/2018 OT Individual Time: 0930-1000 OT Individual Time Calculation (min): 30 min    Short Term Goals: Week 1:  OT Short Term Goal 1 (Week 1): Pt will sit EOB/EOM with MOD A during functional dynamic sitting balance task OT Short Term Goal 1 - Progress (Week 1): Not met OT Short Term Goal 2 (Week 1): Pt will sit to stand with MAX A of 1 in stedy OT Short Term Goal 2 - Progress (Week 1): Met OT Short Term Goal 3 (Week 1): Pt will initate washing face with VC only OT Short Term Goal 3 - Progress (Week 1): Met OT Short Term Goal 4 (Week 1): Pt will locate toothbrush with mod multimodal cues for improved visual scanning OT Short Term Goal 4 - Progress (Week 1): Met OT Short Term Goal 5 (Week 1): Pt will completes 1/4 steps of UB dressing OT Short Term Goal 5 - Progress (Week 1): Met  Skilled Therapeutic Interventions/Progress Updates:    1:1. Pt received bed agreeable to OT and much more responsive with thumbs up/down, smiling and improved initiation overall. Pt selected clothing items with decreased initiation time. Pt completes rolling iwht pt reaching to bed rail with no VC for initiation and MAX A to roll for clothing management. Pt able to manage UE on slide board during transfer with min VC but does not produce enough force to slide across board. Pt dons shirt with MOD A with improved lifting of RUE with LUE this date to place into sleeve with MIN A. Pt completes brushing motion this date when completing oral care as opposed to biting on toothbrush. eixted session with pt seated in w/c, call light in reach, belt alarm on and all needs met  Therapy Documentation Precautions:  Precautions Precautions: Fall Precaution Comments: L crani Restrictions Weight Bearing Restrictions: No General:   Vital Signs:   Pain:   ADL: ADL Grooming: Maximal  assistance Where Assessed-Grooming: Sitting at sink, Wheelchair Upper Body Bathing: Maximal assistance Where Assessed-Upper Body Bathing: Sitting at sink, Wheelchair Lower Body Bathing: Maximal assistance Where Assessed-Lower Body Bathing: Sitting at sink, Wheelchair Upper Body Dressing: Maximal assistance Where Assessed-Upper Body Dressing: Sitting at sink, Wheelchair Lower Body Dressing: Maximal assistance Where Assessed-Lower Body Dressing: Sitting at sink, Wheelchair Toileting: Unable to assess Toilet Transfer: Dependent(+2) Armed forces technical officer Method: Charlaine Dalton) Vision   Perception    Praxis   Exercises:   Other Treatments:     Therapy/Group: Individual Therapy  Tonny Branch 11/01/2018, 12:11 PM

## 2018-11-01 NOTE — Progress Notes (Signed)
Speech Language Pathology Daily Session Note  Patient Details  Name: Gregory Moreno MRN: [id number] Date of Birth: 01-06-1996  Today's Date: 11/01/2018 SLP Individual Time: 1000-1045 SLP Individual Time Calculation (min): 45 min  Short Term Goals: Week 2: SLP Short Term Goal 1 (Week 2): Patient will consume current diet with minimal overt s/s of aspiration with overall Min A verbal and visual cues for use of compensatory strategies. SLP Short Term Goal 2 (Week 2): Patient will vocalize on command in 25% of opportunities with Max multimodal cues. SLP Short Term Goal 3 (Week 2): Patient will establish some form of basic communication to indicate yes/no in regards to wants/needs with Max A multimodal cues in 75% of opportunities SLP Short Term Goal 4 (Week 2): Patient will follow 1-step commands in 50% of opportunities with Max multimodal cues. SLP Short Term Goal 5 (Week 2): Patient will demonstrate sustained attention to tasks for ~10 minutes with Max A multimodal cues for redirection. SLP Short Term Goal 6 (Week 2): Patient will initiate tasks in 75% of opportunitied with Max A multimodal cues.  Skilled Therapeutic Interventions: Skilled treatment session focused on communication goals. SLP facilitated session by downloading an app that allowed patient to free text in order to put thoughts/questions into written words. Patient required extra time and supervision level verbal cues to navigate his ipad and overall Mod-Max A verbal and visual cues for error awareness and efficiency with typing. Patient was able to answer 1 basic question appropriately but was disoriented to time and place and appeared confused when told. Patient also attempted to type one spontaneous phrase without success. Educated mom on new app and she verbalized understanding. Plan to give patient a stylus pen in hopes of efficiency with typing. Patient left upright in the wheelchair with mom present. Continue with current plan  of care.      Pain Muscle pain in chest, RN aware and administered medications   Therapy/Group: Individual Therapy  Gregory Moreno, [place] 11/01/2018, 1:59 PM

## 2018-11-02 ENCOUNTER — Inpatient Hospital Stay (HOSPITAL_COMMUNITY): Payer: 59

## 2018-11-02 ENCOUNTER — Inpatient Hospital Stay (HOSPITAL_COMMUNITY): Payer: 59 | Admitting: Speech Pathology

## 2018-11-02 DIAGNOSIS — G8110 Spastic hemiplegia affecting unspecified side: Secondary | ICD-10-CM

## 2018-11-02 NOTE — Progress Notes (Signed)
Occupational Therapy Session Note  Patient Details  Name: Gregory Moreno MRN: [id number] Date of Birth: 12/29/1995  Today's Date: 11/02/2018 OT Individual Time: 1300-1420 OT Individual Time Calculation (min): 80 min    Short Term Goals: Week 2:  OT Short Term Goal 1 (Week 2): Pt will maintain sitting balance with R UE supported with Mod A OT Short Term Goal 2 (Week 2): Pt will follow one step commands with 75% accuracy OT Short Term Goal 3 (Week 2): Pt will complete log rolling in bed with min VC for hand placement OT Short Term Goal 4 (Week 2): Pt will initiate functional task with L UE with mod cueing  Skilled Therapeutic Interventions/Progress Updates:    Pt received supine with father present. Pt much more interactive overall this session with consistent thumbs up and down in re to questions. Pt also with improved volitional and coordinated LUE use during session. Pt rolled R with max A, initiated moving LUE/LLUE with facilitation required to complete motor plan. [name] pants were donned total A bed level. Pt transitioned to sidelying and then to sitting EOB with max +2 assistance. Pt completed slideboard transfer to TIS w/c with total A, +2 present for safety, improved LUE use during transfer. Pt was taken outside where his mother and a couple friends were waiting, per therapist suggestion to improve self-efficacy, psychosocial adjustment, and motivation in therapy. Pt had a great improvement in interaction with his friends, smiling and pointing when familiar stories were recalled. Pt ate an ice cream sitting up in the chair with min A to scoop and to stabilize. Pt used his iPad to navigate through several apps with set up assist. Pt returned inside and was left sitting up in the TIS w/c with his father and RN present.   Therapy Documentation Precautions:  Precautions Precautions: Fall Precaution Comments: L crani Restrictions Weight Bearing Restrictions: No   Therapy/Group:  Individual Therapy  [name] 11/02/2018, 7:21 AM

## 2018-11-02 NOTE — Progress Notes (Signed)
Much more interactive today, smiling & good eye contact. Condom cath came off at [facility], cleaned patient, new cath applied. Moderate incontinent BM, total assist with hygiene and linen change. Right WHO and PRAFO applied. Right Heel with foam dressing. Gregory Moreno A

## 2018-11-02 NOTE — Progress Notes (Signed)
Speech Language Pathology Daily Session Note  Patient Details  Name: Gregory Moreno MRN: [id number] Date of Birth: 1995-04-13  Today's Date: 11/02/2018 SLP Individual Time: [id number] SLP Individual Time Calculation (min): 51 min  Short Term Goals: Week 2: SLP Short Term Goal 1 (Week 2): Patient will consume current diet with minimal overt s/s of aspiration with overall Min A verbal and visual cues for use of compensatory strategies. SLP Short Term Goal 2 (Week 2): Patient will vocalize on command in 25% of opportunities with Max multimodal cues. SLP Short Term Goal 3 (Week 2): Patient will establish some form of basic communication to indicate yes/no in regards to wants/needs with Max A multimodal cues in 75% of opportunities SLP Short Term Goal 4 (Week 2): Patient will follow 1-step commands in 50% of opportunities with Max multimodal cues. SLP Short Term Goal 5 (Week 2): Patient will demonstrate sustained attention to tasks for ~10 minutes with Max A multimodal cues for redirection. SLP Short Term Goal 6 (Week 2): Patient will initiate tasks in 75% of opportunitied with Max A multimodal cues.  Skilled Therapeutic Interventions:  Pt was seen for skilled ST targeting goals for cognition and dysphagia .  Pt was sitting up in bed awake and alert upon therapist's arrival.  Pt consumed his breakfast following tray set up with min assist cues for use of swallowing precautions and more than a reasonable amount of time.  SLP also facilitated the session with trials of dys 3 textures to continue working towards diet progression.  Pt consumed advanced solids with no more than min assist verbal cues for use of swallowing precautions and no overt s/s of aspiration.  Recommend ongoing trials of advanced consistencies with SLP.  While feeding himself his meal, pt demonstrated slowed task initiation which improved as he progressed through his meal.  He sustained his attention to self feeding for the duration  of his meal (~20 min) with min-mod cues for redirection.  As therapist was departing, pt indicated via thumbs up/thumbs down responses that he had a headache.  He politely declined medication again via thumbs up/thumbs down and his RN was made aware.  Of note, pt was able to copy the [facility] [employer] pick axe hand gesture (school mascot) in response to therapist saying "Go niners!" while making gesture.  Pt also smiled in response to humor multiple times throughout session.   Pt was left resting in bed with bed alarm set and all needs within reach.  Continue per current plan of care.   Pain Pain Assessment Pain Scale: Faces Faces Pain Scale: Hurts a little bit Pain Location: Head Pain Descriptors / Indicators: Headache Pain Intervention(s): Other (Comment)(RN made aware, pt declined meds)  Therapy/Group: Individual Therapy  Chiyeko Ferre, Selinda Orion 11/02/2018, 12:19 PM

## 2018-11-02 NOTE — Progress Notes (Signed)
[facility] PHYSICAL MEDICINE & REHABILITATION PROGRESS NOTE  Subjective/Complaints: Patient seen sitting up in bed this morning.  No reported issues overnight.  He does not engage this morning.  ROS: limited due to cognition   Objective: Vital Signs: Blood pressure 120/73, pulse 79, temperature 98.1 F (36.7 C), resp. rate 19, height 5\' 7"  (1.702 m), weight 72.1 kg, SpO2 98 %. No results found. No results for input(s): WBC, HGB, HCT, PLT in the last 72 hours. No results for input(s): NA, K, CL, CO2, GLUCOSE, BUN, CREATININE, CALCIUM in the last 72 hours.  Physical Exam: BP 120/73 (BP Location: Left Arm)   Pulse 79   Temp 98.1 F (36.7 C)   Resp 19   Ht 5\' 7"  (1.702 m)   Wt 72.1 kg   SpO2 98%   BMI 24.90 kg/m  Constitutional: No distress . Vital signs reviewed. HENT: [place] site C/D/I Improving facial edema Eyes: EOMI. No discharge. Improving left eye ptosis Cardiovascular: No JVD. Respiratory: Normal effort.  No stridor. GI: Non-distended. Skin: Warm and dry.  Intact. Psych: Flat. Musc: No edema in extremities.  No tenderness in extremities. Neuro: Alert Makes eye contact Motor: Limited MMT due to participation/engagement, seen limited movements of bilateral upper extremities,   Assessment/Plan: 1. 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  Physiatrist is providing close team supervision and 24 hour management of active medical problems listed below.  Physiatrist and rehab team continue to assess barriers to discharge/monitor patient progress toward functional and medical goals  Care Tool:  Bathing    Body parts bathed by patient: Right arm, Chest, Abdomen, Left upper leg, Right upper leg, Face   Body parts bathed by helper: Left arm, Front perineal area, Buttocks, Right upper leg, Left upper leg, Right lower leg, Left lower  leg     Bathing assist Assist Level: Maximal Assistance - Patient 24 - 49%     Upper Body Dressing/Undressing Upper body dressing   What is the patient wearing?: Pull over shirt    Upper body assist Assist Level: Moderate Assistance - Patient 50 - 74%    Lower Body Dressing/Undressing Lower body dressing      What is the patient wearing?: Pants     Lower body assist Assist for lower body dressing: 2 Helpers     Toileting Toileting    Toileting assist Assist for toileting: Dependent - Patient 0%     Transfers Chair/bed transfer  Transfers assist  Chair/bed transfer activity did not occur: Safety/medical concerns  Chair/bed transfer assist level: 2 Helpers     Locomotion Ambulation   Ambulation assist   Ambulation activity did not occur: Safety/medical concerns          Walk 10 feet activity   Assist  Walk 10 feet activity did not occur: Safety/medical concerns        Walk 50 feet activity   Assist Walk 50 feet with 2 turns activity did not occur: Safety/medical concerns         Walk 150 feet activity   Assist Walk 150 feet activity did not occur: Safety/medical concerns         Walk 10 feet on uneven surface  activity   Assist Walk 10 feet on uneven surfaces activity did not occur: Safety/medical concerns         Wheelchair     Assist Will patient use wheelchair  at discharge?: (TBD)   Wheelchair activity did not occur: Safety/medical concerns         Wheelchair 50 feet with 2 turns activity    Assist    Wheelchair 50 feet with 2 turns activity did not occur: Safety/medical concerns       Wheelchair 150 feet activity     Assist Wheelchair 150 feet activity did not occur: Safety/medical concerns          Medical Problem List and Plan: 1.Right greater than left-sided weaknesssecondary to intracerebral hemorrhage with GBM.Surgery was done emergently due to signs of herniation due to  notes.Status post left crani for hematoma evacuation and tumor resection 10/13/2018.   Continue CIR  PRAFO, WHO at night  Neuro oncology with extensive discussion with patient and family yesterday regarding diagnosis and prognosis, appreciate assistance  2. Antithrombotics: -DVT/anticoagulation:Subcutaneous heparin initiated 10/16/2018 -antiplatelet therapy: N/A 3. Pain Management:Tylenol as needed 4. Mood:Provide emotional support -antipsychotic agents: N/A 5. Neuropsych: This patientis not of making decisions on hisown behalf. 6. Skin/Wound Care:Routine skin checks 7. Fluids/Electrolytes/Nutrition:Routine in and outs  8. Dysphagia. Dysphasia #2 thin liquids. Follow-up speech therapy  Advance diet as tolerated 9. 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 Constipation  Bowel meds as necessary 11. Spasticity  Baclofen 5mg  2 times daily started on 10/9---increased to TID 10/13 12. Hiccups  Baclofen as needed  Improved 13.  Steroid-induced hyperglycemia  Continue to monitor with steroid wean 14.  Hyponatremia  Sodium 133 on 10/12, labs ordered for tomorrow  Continue to monitor 15.  Transaminitis  LFTs remain elevated--but stable-slightly improved on 10/12 16.  Leukocytosis-likely steroid-induced  WBCs 18.0 on 10/12, labs ordered for tomorrow  Afebrile, no signs of infection   LOS: 12 days A FACE TO FACE EVALUATION WAS PERFORMED   Lorie Phenix 11/02/2018, 9:23 AM

## 2018-11-03 ENCOUNTER — Inpatient Hospital Stay (HOSPITAL_COMMUNITY): Payer: 59 | Admitting: Speech Pathology

## 2018-11-03 ENCOUNTER — Inpatient Hospital Stay (HOSPITAL_COMMUNITY): Payer: 59

## 2018-11-03 ENCOUNTER — Inpatient Hospital Stay (HOSPITAL_COMMUNITY): Payer: 59 | Admitting: Occupational Therapy

## 2018-11-03 ENCOUNTER — Other Ambulatory Visit: Payer: Self-pay | Admitting: Radiation Therapy

## 2018-11-03 LAB — BASIC METABOLIC PANEL
Anion gap: 9 (ref 5–15)
BUN: 15 mg/dL (ref 6–20)
CO2: 27 mmol/L (ref 22–32)
Calcium: 9.4 mg/dL (ref 8.9–10.3)
Chloride: 102 mmol/L (ref 98–111)
Creatinine, Ser: 0.79 mg/dL (ref 0.61–1.24)
GFR calc Af Amer: >60 mL/min (ref >60)
GFR calc non Af Amer: >60 mL/min (ref >60)
Glucose, Bld: 81 mg/dL (ref 70–99)
Potassium: 3.7 mmol/L (ref 3.5–5.1)
Sodium: 138 mmol/L (ref 135–145)

## 2018-11-03 LAB — CBC
HCT: 36 % — ABNORMAL LOW (ref 39.0–52.0)
Hemoglobin: 11.7 g/dL — ABNORMAL LOW (ref 13.0–17.0)
MCH: 29.3 pg (ref 26.0–34.0)
MCHC: 32.5 g/dL (ref 30.0–36.0)
MCV: 90.2 fL (ref 80.0–100.0)
Platelets: 210 10*3/uL (ref 150–400)
RBC: 3.99 MIL/uL — ABNORMAL LOW (ref 4.22–5.81)
RDW: 13.6 % (ref 11.5–15.5)
WBC: 6.7 10*3/uL (ref 4.0–10.5)
nRBC: 0 % (ref 0.0–0.2)

## 2018-11-03 MED ORDER — SALINE SPRAY 0.65 % NA SOLN
2.0000 | Freq: Three times a day (TID) | NASAL | Status: DC
  Administered 2018-11-03 – 2018-11-27 (×24): 2 via NASAL
  Filled 2018-11-03 (×2): qty 44

## 2018-11-03 NOTE — Progress Notes (Signed)
[facility] PHYSICAL MEDICINE & REHABILITATION PROGRESS NOTE  Subjective/Complaints: Up with OT. More interactive. Using some words now. RN reports ongoing nose bleeds this weekend  ROS: limited due to language/communication   Objective: Vital Signs: Blood pressure 107/60, pulse 70, temperature 98.2 F (36.8 C), temperature source Oral, resp. rate 16, height 5\' 7"  (1.702 m), weight 72.1 kg, SpO2 99 %. No results found. Recent Labs    11/03/18 0721  WBC 6.7  HGB 11.7*  HCT 36.0*  PLT 210   Recent Labs    11/03/18 0721  NA 138  K 3.7  CL 102  CO2 27  GLUCOSE 81  BUN 15  CREATININE 0.79  CALCIUM 9.4    Physical Exam: BP 107/60 (BP Location: Left Arm)   Pulse 70   Temp 98.2 F (36.8 C) (Oral)   Resp 16   Ht 5\' 7"  (1.702 m)   Wt 72.1 kg   SpO2 99%   BMI 24.90 kg/m  Constitutional: No distress . Vital signs reviewed. HEENT: EOMI, oral membranes moist Neck: supple Cardiovascular: RRR without murmur. No JVD    Respiratory: CTA Bilaterally without wheezes or rales. Normal effort    GI: BS +, non-tender, non-distended  Skin: Warm and dry.  Intact. Psych: brighter. Offers a smile today Musc: No edema in extremities.  No tenderness in extremities. Neuro: Alert Makes eye contact Motor:  Dense right hemiparesis. No resting tone today, initiated more with left arm  Assessment/Plan: 1. 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  Physiatrist is providing close team supervision and 24 hour management of active medical problems listed below.  Physiatrist and rehab team continue to assess barriers to discharge/monitor patient progress toward functional and medical goals  Care Tool:  Bathing    Body parts bathed by patient: Right arm, Chest, Abdomen, Left upper leg, Right upper leg, Face   Body parts bathed by helper: Left arm, Front  perineal area, Buttocks, Right upper leg, Left upper leg, Right lower leg, Left lower leg     Bathing assist Assist Level: Maximal Assistance - Patient 24 - 49%     Upper Body Dressing/Undressing Upper body dressing   What is the patient wearing?: Pull over shirt    Upper body assist Assist Level: Moderate Assistance - Patient 50 - 74%    Lower Body Dressing/Undressing Lower body dressing      What is the patient wearing?: Pants     Lower body assist Assist for lower body dressing: Dependent - Patient 0%     Toileting Toileting    Toileting assist Assist for toileting: Dependent - Patient 0%     Transfers Chair/bed transfer  Transfers assist  Chair/bed transfer activity did not occur: Safety/medical concerns  Chair/bed transfer assist level: 2 Helpers     Locomotion Ambulation   Ambulation assist   Ambulation activity did not occur: Safety/medical concerns          Walk 10 feet activity   Assist  Walk 10 feet activity did not occur: Safety/medical concerns        Walk 50 feet activity   Assist Walk 50 feet with 2 turns activity did not occur: Safety/medical concerns         Walk 150 feet activity   Assist Walk 150 feet activity did not occur: Safety/medical concerns         Walk 10 feet  on uneven surface  activity   Assist Walk 10 feet on uneven surfaces activity did not occur: Safety/medical concerns         Wheelchair     Assist Will patient use wheelchair at discharge?: (TBD)   Wheelchair activity did not occur: Safety/medical concerns         Wheelchair 50 feet with 2 turns activity    Assist    Wheelchair 50 feet with 2 turns activity did not occur: Safety/medical concerns       Wheelchair 150 feet activity     Assist Wheelchair 150 feet activity did not occur: Safety/medical concerns          Medical Problem List and Plan: 1.Right greater than left-sided weaknesssecondary to  intracerebral hemorrhage with GBM.Surgery was done emergently due to signs of herniation due to notes.Status post left crani for hematoma evacuation and tumor resection 10/13/2018.   Continue CIR  PRAFO, WHO at night  Neuro oncology with extensive and productive discussion with patient and family yesterday regarding diagnosis and prognosis, appreciate assistance  -MRI recommended in 1-2 weeks  -XRT and CTX potentially 4-6 weeks post-op or once max functional gains reached/dc from rehab 2. Antithrombotics: -DVT/anticoagulation:Subcutaneous heparin initiated 10/16/2018 -antiplatelet therapy: N/A 3. Pain Management:Tylenol as needed 4. Mood:Provide emotional support -antipsychotic agents: N/A 5. Neuropsych: This patientis not of making decisions on hisown behalf. 6. Skin/Wound Care:Routine skin checks 7. Fluids/Electrolytes/Nutrition:Routine in and outs  8. Dysphagia. Dysphasia #2 thin liquids. 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 Constipation  Bowel meds as necessary 11. Spasticity  Baclofen 5mg  2 times daily started on 10/9---increased to TID 10/13  -tone controlled at present 12. Hiccups  Baclofen as needed  Improved 13.  Steroid-induced hyperglycemia  Continue to monitor with steroid wean 14.  Hyponatremia  Sodium 138 10/19  Continue to monitor 15.  Transaminitis  LFTs   stable-slightly improved on 10/12 16.  Leukocytosis-likely steroid-induced  WBCs down to 6.7 10/19  LOS: 13 days A FACE TO [place] 11/03/2018, 9:26 AM

## 2018-11-03 NOTE — Progress Notes (Signed)
Speech Language Pathology Daily Session Note  Patient Details  Name: Gregory Moreno MRN: [id number] Date of Birth: 04-Dec-1995  Today's Date: 11/03/2018  Session 1: SLP Individual Time: 0725-0800 SLP Individual Time Calculation (min): 35 min   Session 2: SLP Individual Time: [id number] SLP Individual Time Calculation (min): 70 min  Short Term Goals: Week 2: SLP Short Term Goal 1 (Week 2): Patient will consume current diet with minimal overt s/s of aspiration with overall Min A verbal and visual cues for use of compensatory strategies. SLP Short Term Goal 2 (Week 2): Patient will vocalize on command in 25% of opportunities with Max multimodal cues. SLP Short Term Goal 3 (Week 2): Patient will establish some form of basic communication to indicate yes/no in regards to wants/needs with Max A multimodal cues in 75% of opportunities SLP Short Term Goal 4 (Week 2): Patient will follow 1-step commands in 50% of opportunities with Max multimodal cues. SLP Short Term Goal 5 (Week 2): Patient will demonstrate sustained attention to tasks for ~10 minutes with Max A multimodal cues for redirection. SLP Short Term Goal 6 (Week 2): Patient will initiate tasks in 75% of opportunitied with Max A multimodal cues.  Skilled Therapeutic Interventions:  Session 1: Skilled treatment session focused on dysphagia and cognitive goals. Upon arrival, patient was reclined in bed. SLP facilitated session by providing Max A verbal and tactile cues for patient to assist in adjusting his positioning in bed. When SLP jokingly asked if the patient was helping, he replied, "I'm trying."  Despite Max A multimodal cues, patient unable to voice/verbalize on command. Patient self-fed his meal of Dys. 2 textures with thin liquids with Mod verbal cues needed for use of small bites and a slow rate of self-feeding. Patient consumed meal without overt s/s of aspiration, therefore, recommend patient continue current diet. Patient handed  off to NT. Continue with current plan of care.   Session 2: Skilled treatment session focused on communication goals. SLP facilitated session by providing Max A multimodal cues for patient to self-monitor and correct errors at the phrase level during written expression while utilizing his ipad. Patient was typing responses in regards to basic questions but phrases were essentially unintelligible. Therefore, patient utilized hand gestures to make majority of needs known. SLP also facilitated session by providing Max A multimodal cues for patient to vocalize/verbalize on command during prefunctional speech tasks and automatic speech tasks. Due to patient's severe apraxia, patient is unable to coordinate a large breath or enough tension in order to vocalize on command despite Max A multimodal cues with minimal movement of oral musculature noted. Patient left upright in wheelchair with mom present and alarm on. Continue with current plan of care.   Pain No/Denies Pain   Therapy/Group: Individual Therapy  [name] 11/03/2018, 2:28 PM

## 2018-11-03 NOTE — Progress Notes (Signed)
No nose bleeds noted over weekend. Smiling, more interactive, increased initiation. Foam dressing changed to right heel. Quarter size, blanchable red area unchanged. Right WHO and right PRAFO applied after HS bath. Condom cath in place to manage incontinence. Incontinent BM. Left crani site with scabbed areas. Gregory Moreno A

## 2018-11-03 NOTE — Progress Notes (Signed)
Occupational Therapy Session Note  Patient Details  Name: Gregory Moreno MRN: [id number] Date of Birth: 09-05-1995  Today's Date: 11/03/2018 OT Individual Time: 0803-0900 OT Individual Time Calculation (min): 57 min    Short Term Goals: Week 2:  OT Short Term Goal 1 (Week 2): Pt will maintain sitting balance with R UE supported with Mod A OT Short Term Goal 2 (Week 2): Pt will follow one step commands with 75% accuracy OT Short Term Goal 3 (Week 2): Pt will complete log rolling in bed with min VC for hand placement OT Short Term Goal 4 (Week 2): Pt will initiate functional task with L UE with mod cueing  Skilled Therapeutic Interventions/Progress Updates:    Upon entering the room, pt supine in bed with NT present and finishing breakfast. Pt is agreeable to OT intervention. Pt does not verbally speak but nods head and gives thumbs up to answer questions. [name] vitals taken in supine with BP of 131/85 and 107 bpm at rest. Pt rolling L <> R with total A but pt does attempt to reach and initiate movement. LB clothing threaded and pulled over B hips with total A of 1. Supine >sit total A to EOB with max A static sitting balance. Slide board placed with pt transferred into tilt in space wheelchair with +2 assist for safety and set up of equipment. Pt preformed anterior weight shift for removal of shirt with mod A to don clean shirt this session with increased time and cuing for technique. Set up A to brush teeth with pt able to brush himself with increased time to complete task. Pt does swallow toothpaste instead of spitting but has children's toothpaste that can be safely swallowed. Pt remained tilted in wheelchair at end of session with chair alarm belt donned for safety and call bell within reach.   Therapy Documentation Precautions:  Precautions Precautions: Fall Precaution Comments: L crani Restrictions Weight Bearing Restrictions: No ADL: ADL Grooming: Maximal assistance Where  Assessed-Grooming: Sitting at sink, Wheelchair Upper Body Bathing: Maximal assistance Where Assessed-Upper Body Bathing: Sitting at sink, Wheelchair Lower Body Bathing: Maximal assistance Where Assessed-Lower Body Bathing: Sitting at sink, Wheelchair Upper Body Dressing: Maximal assistance Where Assessed-Upper Body Dressing: Sitting at sink, Wheelchair Lower Body Dressing: Maximal assistance Where Assessed-Lower Body Dressing: Sitting at sink, Wheelchair Toileting: Unable to assess Toilet Transfer: Dependent(+2) Toilet Transfer Method: (STEDY)   Therapy/Group: Individual Therapy  [name] 11/03/2018, 9:15 AM

## 2018-11-03 NOTE — Progress Notes (Signed)
Physical Therapy Session Note  Patient Details  Name: Gregory Moreno MRN: [id number] Date of Birth: 05-24-1995  Today's Date: 11/03/2018 PT Individual Time: 1115-1200 PT Individual Time Calculation (min): 45 min   Short Term Goals: Week 2:  PT Short Term Goal 1 (Week 2): Pt will consistently complete bed mobilty with max assist +1. PT Short Term Goal 2 (Week 2): Pt will transfer bed<>w/c with max assist +1. PT Short Term Goal 3 (Week 2): Pt will demonstrate dynamic sitting balance with max assist.  Skilled Therapeutic Interventions/Progress Updates:  Pt received in TIS w/c & agreeable to tx. Pt more engaged in session, smiling at times & communicating via thumbs up/down. Transported pt to/from dayroom via w/c dependent assist for time management. Pt transfers always to lower surface for increased ease of transfer, w/c<>mat table with +2 assist with pt able to position LUE with cuing and assist with scooting across board with extra time. While sitting EOM pt engaged in reaching outside of BOS in all directions to challenge sitting balance & focus on trunk control. Pt is able to initiate movement but requires anywhere from CGA<>max assist to return to midline and correct anterior lean. Also focused on core strengthening with pt coming forward from semi reclined position with pt demonstrating improved initiation of task. Pt attempted sit<>stand from elevated EOM with +2 assist with max cuing for technique with pt demonstrating inability to activate either LE to participate in standing and instead remains in flexed position fully supported by therapists. At end of session pt left in TIS w/c with chair alarm donned & mother in room to supervise.   Therapy Documentation Precautions:  Precautions Precautions: Fall Precaution Comments: L crani Restrictions Weight Bearing Restrictions: No  Pain: No behaviors demonstrating pain during session.    Therapy/Group: Individual Therapy  [name] 11/03/2018, 12:20 PM

## 2018-11-04 ENCOUNTER — Inpatient Hospital Stay (HOSPITAL_COMMUNITY): Payer: 59 | Admitting: Physical Therapy

## 2018-11-04 ENCOUNTER — Inpatient Hospital Stay (HOSPITAL_COMMUNITY): Payer: 59 | Admitting: Speech Pathology

## 2018-11-04 ENCOUNTER — Inpatient Hospital Stay (HOSPITAL_COMMUNITY): Payer: 59 | Admitting: Occupational Therapy

## 2018-11-04 NOTE — Progress Notes (Signed)
[facility] PHYSICAL MEDICINE & REHABILITATION PROGRESS NOTE  Subjective/Complaints: No new issues. Lying in bed. Just finished breakfast. Gave me the thumbs up when I came in  ROS: limited due to language/communication   Objective: Vital Signs: Blood pressure 114/67, pulse 76, temperature 98.2 F (36.8 C), resp. rate 16, height 5\' 7"  (1.702 m), weight 72.1 kg, SpO2 99 %. No results found. Recent Labs    11/03/18 0721  WBC 6.7  HGB 11.7*  HCT 36.0*  PLT 210   Recent Labs    11/03/18 0721  NA 138  K 3.7  CL 102  CO2 27  GLUCOSE 81  BUN 15  CREATININE 0.79  CALCIUM 9.4    Physical Exam: BP 114/67 (BP Location: Right Arm)   Pulse 76   Temp 98.2 F (36.8 C)   Resp 16   Ht 5\' 7"  (1.702 m)   Wt 72.1 kg   SpO2 99%   BMI 24.90 kg/m  Constitutional: No distress . Vital signs reviewed. HEENT: EOMI, oral membranes moist Neck: supple Cardiovascular: RRR without murmur. No JVD    Respiratory: CTA Bilaterally without wheezes or rales. Normal effort    GI: BS +, non-tender, non-distended  Skin: Warm and dry.  Intact. Psych: brighter. smiles Musc: No edema in extremities.  No tenderness in extremities. Neuro: Alert Makes eye contact Motor:  Dense right hemiparesis. No resting tone once again today, initiates more with left arm  Assessment/Plan: 1. 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  Physiatrist is providing close team supervision and 24 hour management of active medical problems listed below.  Physiatrist and rehab team continue to assess barriers to discharge/monitor patient progress toward functional and medical goals  Care Tool:  Bathing    Body parts bathed by patient: Right arm, Chest, Abdomen, Left upper leg, Right upper leg, Face   Body parts bathed by helper: Left arm, Front perineal area, Buttocks, Right upper  leg, Left upper leg, Right lower leg, Left lower leg     Bathing assist Assist Level: Maximal Assistance - Patient 24 - 49%     Upper Body Dressing/Undressing Upper body dressing   What is the patient wearing?: Pull over shirt    Upper body assist Assist Level: Moderate Assistance - Patient 50 - 74%    Lower Body Dressing/Undressing Lower body dressing      What is the patient wearing?: Pants     Lower body assist Assist for lower body dressing: Dependent - Patient 0%     Toileting Toileting    Toileting assist Assist for toileting: Dependent - Patient 0%     Transfers Chair/bed transfer  Transfers assist  Chair/bed transfer activity did not occur: Safety/medical concerns  Chair/bed transfer assist level: 2 Helpers     Locomotion Ambulation   Ambulation assist   Ambulation activity did not occur: Safety/medical concerns          Walk 10 feet activity   Assist  Walk 10 feet activity did not occur: Safety/medical concerns        Walk 50 feet activity   Assist Walk 50 feet with 2 turns activity did not occur: Safety/medical concerns         Walk 150 feet activity   Assist Walk 150 feet activity did not occur: Safety/medical concerns         Walk 10 feet on uneven surface  activity   Assist Walk 10 feet on uneven surfaces activity did not occur: Safety/medical concerns         Wheelchair     Assist Will patient use wheelchair at discharge?: (TBD)   Wheelchair activity did not occur: Safety/medical concerns         Wheelchair 50 feet with 2 turns activity    Assist    Wheelchair 50 feet with 2 turns activity did not occur: Safety/medical concerns       Wheelchair 150 feet activity     Assist Wheelchair 150 feet activity did not occur: Safety/medical concerns          Medical Problem List and Plan: 1.Right greater than left-sided weaknesssecondary to intracerebral hemorrhage with GBM.Surgery  was done emergently due to signs of herniation due to notes.Status post left crani for hematoma evacuation and tumor resection 10/13/2018.   Continue CIR--team conference  PRAFO, WHO at night  Neuro oncology with extensive and productive discussion with patient and family yesterday regarding diagnosis and prognosis, appreciate assistance  -MRI recommended in 1-2 weeks  -XRT and CTX potentially 4-6 weeks post-op or once max functional gains reached/dc from rehab 2. Antithrombotics: -DVT/anticoagulation:Subcutaneous heparin initiated 10/16/2018 -antiplatelet therapy: N/A 3. Pain Management:Tylenol as needed 4. Mood:Provide emotional support -antipsychotic agents: N/A 5. Neuropsych: This patientis not of making decisions on hisown behalf. 6. Skin/Wound Care:Routine skin checks 7. Fluids/Electrolytes/Nutrition:Routine in and outs  8. Dysphagia. Dysphasia #2 thin liquids. 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 Constipation  Bowel meds as necessary 11. Spasticity  Baclofen 5mg  2 times daily started on 10/9---increased to TID 10/13  -tone controlled---no changes 12. Hiccups  Baclofen as needed  Improved 13.  Steroid-induced hyperglycemia  Continue to monitor with steroid wean 14.  Hyponatremia  Sodium 138 10/19  Continue to monitor 15.  Transaminitis  LFTs   stable-slightly improved on 10/12 16.  Leukocytosis-likely steroid-induced  WBCs down to 6.7 10/19  LOS: 14 days A FACE TO [place] 11/04/2018, 9:35 AM

## 2018-11-04 NOTE — Progress Notes (Signed)
Occupational Therapy Session Note  Patient Details  Name: Gregory Moreno MRN: [id number] Date of Birth: 06-18-1995  Today's Date: 11/04/2018 OT Individual Time: 4268-3419 OT Individual Time Calculation (min): 55 min    Short Term Goals: Week 1:  OT Short Term Goal 1 (Week 1): Pt will sit EOB/EOM with MOD A during functional dynamic sitting balance task OT Short Term Goal 1 - Progress (Week 1): Not met OT Short Term Goal 2 (Week 1): Pt will sit to stand with MAX A of 1 in stedy OT Short Term Goal 2 - Progress (Week 1): Met OT Short Term Goal 3 (Week 1): Pt will initate washing face with VC only OT Short Term Goal 3 - Progress (Week 1): Met OT Short Term Goal 4 (Week 1): Pt will locate toothbrush with mod multimodal cues for improved visual scanning OT Short Term Goal 4 - Progress (Week 1): Met OT Short Term Goal 5 (Week 1): Pt will completes 1/4 steps of UB dressing OT Short Term Goal 5 - Progress (Week 1): Met Week 2:  OT Short Term Goal 1 (Week 2): Pt will maintain sitting balance with R UE supported with Mod A OT Short Term Goal 2 (Week 2): Pt will follow one step commands with 75% accuracy OT Short Term Goal 3 (Week 2): Pt will complete log rolling in bed with min VC for hand placement OT Short Term Goal 4 (Week 2): Pt will initiate functional task with L UE with mod cueing  Skilled Therapeutic Interventions/Progress Updates:    1:1 Dad present for session. NMR with focus on facilitating normal patterns of movement and initiation of activity in right UE and LE. In sitting pt able to demonstrate shoulder adduction and slight elbow flexion with functional command. Pt able to sit EOM with min to mod A - exhibiting LOB both anteriorly and posteriority requiring multimodal cues and faciltiation to correct (including mirror). Transitioned into supine to address transitional movements and segmental movements in rolling with promotion of initiation activity in right UE and LE. Pt able to  elicit activity in hip, glut and quad. Performed rolling from supine into sidelying on both sides. Transitioned back into sitting with mod A with extra time from left sidelying. Transitioned into sit to stands from an elevated surface with focus on pt's initiation and achieving upright trunk and hip extension. With cues pt able to terminal left knee extension however fatigues quickly and left quad mm quivering. Squat pivot transfer into w/c with max A with pt able assist with carrying through transfer once therapist A with initiation  lift. Left with dad resting in tilt in space w/c.Marland Kitchen  Therapy Documentation Precautions:  Precautions Precautions: Fall Precaution Comments: L crani Restrictions Weight Bearing Restrictions: No Pain: No indications of pain in session   Therapy/Group: Individual Therapy  Willeen Cass [facility] 11/04/2018, 2:03 PM

## 2018-11-04 NOTE — Progress Notes (Signed)
Physical Therapy Weekly Progress Note  Patient Details  Name: Gregory Moreno MRN: [id number] Date of Birth: [birth year]  Beginning of progress report period: October 29, 2018 End of progress report period: November 04, 2018  Today's Date: 11/04/2018  Patient has met 0 of 3 short term goals.  Pt is making slow progress towards all LTG's. Pt continues to require +2 assist for all slide board transfers for safety but pt is demonstrating more initiation & participation in functional mobility. Pt has been provided a manual w/c and initiated w/c mobility, but currently requires mod assist overall. These past few days pt has made great progress and demonstrates more engagement in therapy. Pt would benefit from continued skilled PT treatment to focus on sitting balance, trunk/head control, transfers, bed mobility, w/c mobility, and L NMR, as well as for caregiver education prior to d/c.  Patient continues to demonstrate the following deficits muscle weakness and muscle joint tightness, decreased cardiorespiratoy endurance, decreased coordination and decreased motor planning, decreased visual perceptual skills, decreased attention to left, decreased initiation, decreased attention, decreased awareness, decreased problem solving, decreased safety awareness, decreased memory and delayed processing, and decreased sitting balance, decreased standing balance, decreased postural control, hemiplegia and decreased balance strategies and therefore will continue to benefit from skilled PT intervention to increase functional independence with mobility.  Patient progressing toward long term goals..  Plan of care revisions: wheelchair mobility goal upgraded 2/2 good progress.  PT Short Term Goals Week 2:  PT Short Term Goal 1 (Week 2): Pt will consistently complete bed mobilty with max assist +1. PT Short Term Goal 1 - Progress (Week 2): Progressing toward goal PT Short Term Goal 2 (Week 2): Pt will transfer bed<>w/c  with max assist +1. PT Short Term Goal 2 - Progress (Week 2): Progressing toward goal PT Short Term Goal 3 (Week 2): Pt will demonstrate dynamic sitting balance with max assist. PT Short Term Goal 3 - Progress (Week 2): Progressing toward goal Week 3:  PT Short Term Goal 1 (Week 3): Pt will consistently complete all bed mobility tasks with max assist +1. PT Short Term Goal 2 (Week 3): Pt will consistently complete bed<>w/c transfers with max assist +1. PT Short Term Goal 3 (Week 3): Pt will propel w/c 100 ft with min assist.  Therapy Documentation Precautions:  Precautions Precautions: Fall Precaution Comments: L crani Restrictions Weight Bearing Restrictions: No    Therapy/Group: Individual Therapy  [name] 11/04/2018, 3:45 PM

## 2018-11-04 NOTE — Progress Notes (Signed)
Speech Language Pathology Daily Session Note  Patient Details  Name: Gregory Moreno MRN: [id number] Date of Birth: [birth year]  Today's Date: 11/04/2018  Session 1: SLP Individual Time: 0725-0800 SLP Individual Time Calculation (min): 35 min   Session 2: SLP Individual Time: 1355-1435 SLP Individual Time Calculation (min): 40 min  Short Term Goals: Week 2: SLP Short Term Goal 1 (Week 2): Patient will consume current diet with minimal overt s/s of aspiration with overall Min A verbal and visual cues for use of compensatory strategies. SLP Short Term Goal 2 (Week 2): Patient will vocalize on command in 25% of opportunities with Max multimodal cues. SLP Short Term Goal 3 (Week 2): Patient will establish some form of basic communication to indicate yes/no in regards to wants/needs with Max A multimodal cues in 75% of opportunities SLP Short Term Goal 4 (Week 2): Patient will follow 1-step commands in 50% of opportunities with Max multimodal cues. SLP Short Term Goal 5 (Week 2): Patient will demonstrate sustained attention to tasks for ~10 minutes with Max A multimodal cues for redirection. SLP Short Term Goal 6 (Week 2): Patient will initiate tasks in 75% of opportunitied with Max A multimodal cues.  Skilled Therapeutic Interventions:  Session 1: Skilled treatment session focused on dysphagia and cognitive goals. SLP facilitated session by providing Min A verbal cues for use of swallowing compensatory strategies with breakfast meal of Dys. 2 textures with thin liquids. Patient consumed meal without overt s/s of aspiration and also consumed trials of Dys. 3 textures with efficient mastication and complete oral clearance. Therefore, recommend trial tray prior to upgrade. SLP also facilitated session by providing Min A verbal cues for basic problem solving and initiation during tray set-up/self-feeding. Patient also consistently utilized gestures to communicate basic wants/needs throuhgout session.  Patient handed off to NT. Continue with current plan of care.   Session 2: Skilled treatment session focused on cognitive-linguistic goals. SLP facilitated session by providing Max-Total A verbal and written cues for orientation to month, place and city when given choices from field of 2. SLP also facilitated session by utilizing automatic tasks like licking ice cream from a spoon to facilitate oral motor movements. Patient able to open his mouth on command X 3 and stick out his tongue on command X 3 with extra time and Max verbal cues. Patient left upright in wheelchair with alarm on father present. Continue with current plan of care.   Pain No/Denies Pain   Therapy/Group: Individual Therapy  Presly Steinruck 11/04/2018, 10:08 AM

## 2018-11-04 NOTE — Progress Notes (Signed)
Physical Therapy Session Note  Patient Details  Name: Gregory Moreno MRN: [id number] Date of Birth: November 09, 1995  Today's Date: 11/04/2018 PT Individual Time: [phone] and [id number] PT Individual Time Calculation (min): 55 min and 40 min  Short Term Goals: Week 2:  PT Short Term Goal 1 (Week 2): Pt will consistently complete bed mobilty with max assist +1. PT Short Term Goal 2 (Week 2): Pt will transfer bed<>w/c with max assist +1. PT Short Term Goal 3 (Week 2): Pt will demonstrate dynamic sitting balance with max assist.  Skilled Therapeutic Interventions/Progress Updates:  Treatment 1: Pt received in bed. Pt rolled L<>R with max assist +2 but pt with more initiation and engagement with rolling R with use of bed rails to allow therapist to don pants max assist. Therapist also dons B teds, socks, & shoes total assist. Pt transfers bed>w/c and w/c<>tilt table with maximove for time management. In dayroom, pt on tilt table & controlling up/down mechanism with instructional cuing. Pt tolerated full upright on tilt table ~20 minutes with straps slightly loosened to allow pt to reach L/R across midline to retreive & toss bean bags. Pt performed 3 sets x 10 reps of LLE terminal knee extension with pt keeping count of repetitions without assistance. At end of session pt left in TIS w/c with chair alarm donned, call bell in lap, father in room to supervise. Pt communicating with thumbs up/down consistently throughout session. Pt did demonstrate behaviors that exhibited pain with chest strap of tilt table donned tightly so strap loosened & pt reported increased comfort.   Treatment 2: Pt received in TIS w/c with father present for session. No behaviors demonstrating pain during session. Provided pt with 16x16 w/c with ELR & pt completed slide board transfer TIS w/c>manual w/c with max assist +1 but 2nd person present for safety & to stabilize board. Pt is able to participate in anterior weight shifting and  pushing with LUE/LLE to scoot across board. Pt propels w/c ~75 ft with therapist providing multimodal cuing for L hemi technique but pt mainly using LUE only. At end of session pt left in w/c with chair alarm donned & father present to supervise, call bell in reach.  Therapy Documentation Precautions:  Precautions Precautions: Fall Precaution Comments: L crani Restrictions Weight Bearing Restrictions: No    Therapy/Group: Individual Therapy  [name] 11/04/2018, 3:39 PM

## 2018-11-04 NOTE — Patient Care Conference (Signed)
Inpatient RehabilitationTeam Conference and Plan of Care Update Date: 11/04/2018   Time: 10:05 AM    Patient Name: Gregory Moreno      Medical Record Number: [id number]  Date of Birth: 03/14/1995 Sex: Male         Room/Bed: 4W01C/4W01C-01 Payor Info: Payor: Theme park manager / Plan: Theme park manager OTHER / Product Type: *No Product type* /    Admit Date/Time:  10/21/2018  4:11 PM  Primary Diagnosis:  Intracranial tumor ([place])  Patient Active Problem List   Diagnosis Date Noted  . Spastic hemiparesis ([place])   . Cerebral edema ([facility])   . Intracranial tumor ([place])   . Steroid-induced hyperglycemia   . Spastic hemiplegia affecting nondominant side ([place])   . Hyponatremia   . Transaminitis   . Leucocytosis   . Right spastic hemiplegia ([place]) 10/21/2018  . Dysphagia 10/21/2018  . 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 Brain mass   . ICH (intracerebral hemorrhage) ([place]) 10/13/2018    Expected Discharge Date: Expected Discharge Date: 11/21/18  Team Members Present: Physician leading conference: Dr. Alger Simons Social Worker Present: Lennart Pall, LCSW Nurse Present: Other (comment)(Tina Wynetta Emery, RN) PT Present: Lavone Nian, PT OT Present: Laverle Hobby, OT SLP Present: Weston Anna, SLP PPS Coordinator present : Gunnar Fusi, SLP     Current Status/Progress Goal Weekly Team Focus  Bowel/Bladder   Incontinent of B/B LBM 10/18  regain function /continence  timed toilet   Swallow/Nutrition/ Hydration   Dys. 2 textures with thin liquids, Min A  Supervision  use of strategies, trials of Dys. 3 textures   ADL's   Improved initiation and participation, slideboard transfers +1 assist, +2 for safety, max-total A ADLs  mod A overall for ADLs and transfers  Initiation, LUE coordination/voluntary movement, RUE NMR, ADL retraining, transfers   Mobility   +2 max/total assist overall for bed mobility, bed<>w/c transfers with slide board, impaired cognition/communication   downgraded to mod/max assist overall, w/c level  sitting balance, head/trunk control, midline orientation, awareness, transers, bed mobility   Communication   Mod-Max A  Max A  use of multimodal commands, voicing   Safety/Cognition/ Behavioral Observations  Mod-Max A  Min A  attention, problem solving, orientation   Pain   denies pain  remain pain free  monitor pain qshift and prn   Skin   healing incision Left side of head  free of infection skin remain intact  assess skin qshift and prn      *See Care Plan and progress notes for long and short-term goals.     Barriers to Discharge  Current Status/Progress Possible Resolutions Date Resolved   Nursing                  PT                    OT                  SLP                SW                Discharge Planning/Teaching Needs:  Home with parents to provide 24/7 assistance.  Parents here daily;  formal teaching to be completed closer to d/c.   Team Discussion:  Path + for glioblastoma - pt and fam aware.  meds adjusted;  Condom cath;  Improved initiation overall.  Max +2 for ADLs. Some decreasing in tone.  More engaged and better participation.  Improved po intake on Dys 3 (trials).  Beginning some verbal communication.  Revisions to Treatment Plan:  NA    Medical Summary Current Status: Path + for GBM. plan for xrt and ctx once maxed out functionally. amantadine and ritalin for attention and engagement with improvement Weekly Focus/Goal: see above  Barriers to Discharge: Medical stability       Continued Need for Acute Rehabilitation Level of Care: The patient requires daily medical management by a physician with specialized training in physical medicine and rehabilitation for the following reasons: Direction of a multidisciplinary physical rehabilitation program to maximize functional independence : Yes Medical management of patient stability for increased activity during participation in an intensive  rehabilitation regime.: Yes Analysis of laboratory values and/or radiology reports with any subsequent need for medication adjustment and/or medical intervention. : Yes   I attest that I was present, lead the team conference, and concur with the assessment and plan of the team.   Verneta Hamidi 11/04/2018, 11:07 AM   Team conference was held via web/ teleconference due to [place] - 19

## 2018-11-05 ENCOUNTER — Inpatient Hospital Stay (HOSPITAL_COMMUNITY): Payer: 59 | Admitting: Speech Pathology

## 2018-11-05 ENCOUNTER — Inpatient Hospital Stay (HOSPITAL_COMMUNITY): Payer: 59

## 2018-11-05 ENCOUNTER — Inpatient Hospital Stay (HOSPITAL_COMMUNITY): Payer: 59 | Admitting: Physical Therapy

## 2018-11-05 NOTE — Progress Notes (Signed)
Physical Therapy Session Note  Patient Details  Name: Gregory Moreno MRN: [id number] Date of Birth: January 04, 1996  Today's Date: 11/05/2018 PT Individual Time: [id number] PT Individual Time Calculation (min): 54 min   Short Term Goals: Week 3:  PT Short Term Goal 1 (Week 3): Pt will consistently complete all bed mobility tasks with max assist +1. PT Short Term Goal 2 (Week 3): Pt will consistently complete bed<>w/c transfers with max assist +1. PT Short Term Goal 3 (Week 3): Pt will propel w/c 100 ft with min assist.  Skilled Therapeutic Interventions/Progress Updates:  Pt received in bed & agreeable to tx. Pt rolls R with total assist but more initiation and attempts to pull with LUE on bed rail. Therapist dons shorts, teds, socks, & shoes total assist for time management. Pt transfers R sidelying>sitting EOB with total assist and completes slide board transfer initially with max assist with pt scooting buttocks across board but ultimately requiring +2 assist to fully scoot to w/c. Pt propels w/c room>elevators with L hemi technique and mod assist for linear path, max assist for turns as pt continues to demonstrate difficulty coordinating LLE in addition to LUE. At rail in hallway, with mirror for visual feedback, pt performed sit<>stands with total assist +1 but 2nd person present for safety. Pt is able to demonstrate anterior weight shifting and pushing with LUE but with great difficulty activating BLE to power up and then requires significant assist & multimodal cuing to upright posture (upright trunk, activate glutes & hip extensors). Pt performs sit<>stand ~3 times with therapist providing support/blocking R knee to prevent buckling. Pt is unable to fully extend either BLE knees during standing. Therapist dons R GRAFO total assist and pt transfers to standing again. Pt requires multiple attempts and therapist providing total assist for weight shifting R but pt is ultimately able to take 4 steps  with LLE with therapist providing total assist for RLE foot advancement (significant flexor tone noted when therapist attempts to advance extremity). At end of session pt left in w/c with chair alarm donned, call bell in reach, mother present in room to supervise.   Therapy Documentation Precautions:  Precautions Precautions: Fall Precaution Comments: L crani Restrictions Weight Bearing Restrictions: No  Pain: No behaviors demonstrating pain during session.    Therapy/Group: Individual Therapy  [name] 11/05/2018, 11:38 AM

## 2018-11-05 NOTE — Progress Notes (Signed)
Occupational Therapy Weekly Progress Note  Patient Details  Name: AVERIE MEINER MRN: [id number] Date of Birth: 05/09/1995  Beginning of progress report period: October 29, 2018 End of progress report period: November 05, 2018  Today's Date: 11/05/2018 OT Individual Time: 1300-1400 OT Individual Time Calculation (min): 60 min    Patient has met 3 of 4 short term goals.  Pt has made steady progress this reporting period with marked improvements in initiation, functional communication and following 1 step commands. Pt continues to remain at MOD A for UB ADLs, TOTAL A for LB ADLs and transfers. Pt continues to demo significant impairments in cognition, strength, endurance, R hemiplegia, trunk/head control and force production impacting performance of ADLs.  Patient continues to demonstrate the following deficits: muscle weakness, decreased cardiorespiratoy endurance, impaired timing and sequencing, abnormal tone, unbalanced muscle activation, motor apraxia, ataxia, decreased coordination and decreased motor planning, decreased midline orientation, decreased attention to right, decreased motor planning and ideational apraxia, decreased initiation, decreased attention, decreased awareness, decreased problem solving, decreased safety awareness, decreased memory and delayed processing and decreased sitting balance, decreased standing balance, decreased postural control, hemiplegia and decreased balance strategies and therefore will continue to benefit from skilled OT intervention to enhance overall performance with BADL.  Patient progressing toward long term goals..  Continue plan of care.  OT Short Term Goals Week 2:  OT Short Term Goal 1 (Week 2): Pt will maintain sitting balance with R UE supported with Mod A OT Short Term Goal 1 - Progress (Week 2): Met OT Short Term Goal 2 (Week 2): Pt will follow one step commands with 75% accuracy OT Short Term Goal 2 - Progress (Week 2): Met OT Short Term  Goal 3 (Week 2): Pt will complete log rolling in bed with min VC for hand placement OT Short Term Goal 3 - Progress (Week 2): Not met OT Short Term Goal 4 (Week 2): Pt will initiate functional task with L UE with mod cueing OT Short Term Goal 4 - Progress (Week 2): Met Week 3:     Skilled Therapeutic Interventions/Progress Updates:    Pt received in TIS agreeable to OT and indicating HA with thumbs up. Pt completes MAX A squat pivot transfer TIS<>EOM knee block and anterior weight shift. Pt sits EOB with S-MOD A for sitting balance. Pt with intermittent total LOB when reaching across midline with LUE while playing game of cornhole. Pt with significantly improved initiation, attention and command following. Pt completes 3 sit to stands with total A and knee block with pt pulling up and standard arm chair and pushing up on OT shoulder for upright posture and use of mirror for visual feedback. Pt completes 3x10 shoulder protraction/retraction, flexion/ext, ab/adduction and elbow flex/ext in gravity eliminated positions supine/sidelying on mat with scapular mobilization provided. Trace adduciton and elbow flexion present. Exited sessionw iht pt setaed in w/c, call lightin reach and all nee dsmet  Therapy Documentation Precautions:  Precautions Precautions: Fall Precaution Comments: L crani Restrictions Weight Bearing Restrictions: No General:   Vital Signs: Therapy Vitals Temp: 97.9 F (36.6 C) Temp Source: Oral Pulse Rate: 82 Resp: 16 BP: 120/83 Patient Position (if appropriate): Lying Oxygen Therapy SpO2: 97 % O2 Device: Room Air Pain:   ADL: ADL Grooming: Maximal assistance Where Assessed-Grooming: Sitting at sink, Wheelchair Upper Body Bathing: Maximal assistance Where Assessed-Upper Body Bathing: Sitting at sink, Wheelchair Lower Body Bathing: Maximal assistance Where Assessed-Lower Body Bathing: Sitting at sink, Wheelchair Upper Body Dressing: Maximal assistance Where  Assessed-Upper Body Dressing: Sitting at sink, Wheelchair Lower Body Dressing: Maximal assistance Where Assessed-Lower Body Dressing: Sitting at sink, Wheelchair Toileting: Unable to assess Toilet Transfer: Dependent(+2) Toilet Transfer Method: Charlaine Dalton) Vision   Perception    Praxis   Exercises:   Other Treatments:     Therapy/Group: Individual Therapy  Tonny Branch 11/05/2018, 7:23 AM

## 2018-11-05 NOTE — Progress Notes (Signed)
Speech Language Pathology Weekly Progress and Session Note  Patient Details  Name: Gregory Moreno MRN: [id number] Date of Birth: 06/24/1995  Beginning of progress report period: October 29, 2018 End of progress report period: November 05, 2018  Today's Date: 11/05/2018 SLP Individual Time: 1100-1155 SLP Individual Time Calculation (min): 55 min  Short Term Goals: Week 2: SLP Short Term Goal 1 (Week 2): Patient will consume current diet with minimal overt s/s of aspiration with overall Min A verbal and visual cues for use of compensatory strategies. SLP Short Term Goal 1 - Progress (Week 2): Met SLP Short Term Goal 2 (Week 2): Patient will vocalize on command in 25% of opportunities with Max multimodal cues. SLP Short Term Goal 2 - Progress (Week 2): Not met SLP Short Term Goal 3 (Week 2): Patient will establish some form of basic communication to indicate yes/no in regards to wants/needs with Max A multimodal cues in 75% of opportunities SLP Short Term Goal 3 - Progress (Week 2): Met SLP Short Term Goal 4 (Week 2): Patient will follow 1-step commands in 50% of opportunities with Max multimodal cues. SLP Short Term Goal 4 - Progress (Week 2): Met SLP Short Term Goal 5 (Week 2): Patient will demonstrate sustained attention to tasks for ~10 minutes with Max A multimodal cues for redirection. SLP Short Term Goal 5 - Progress (Week 2): Met SLP Short Term Goal 6 (Week 2): Patient will initiate tasks in 75% of opportunitied with Max A multimodal cues. SLP Short Term Goal 6 - Progress (Week 2): Met    New Short Term Goals: Week 3: SLP Short Term Goal 1 (Week 3): Patient will vocalize on command in 25% of opportunities with Max multimodal cues. SLP Short Term Goal 2 (Week 3): Patient will utilize multimodal communication to express basic wants/needs with Max A verbal cues. SLP Short Term Goal 3 (Week 3): Patient will demonstrate sustained attention to functional tasks for 30 minutes with Mod  verbal cues for redirection. SLP Short Term Goal 4 (Week 3): Patient will demonstrate basic problem solving with Mod verbal cues with functional and familair tasks. SLP Short Term Goal 5 (Week 3): Patient will be oriented to place and time with Mod A verbal cues. SLP Short Term Goal 6 (Week 3): Patient will consume current diet of Dys. 3 textures with thin liquids with minimal overt s/s of aspiration with Min verbal cues for use of compensatory strategies.  Weekly Progress Updates: Patient has made excellent gains and has met 5 of 6 STGs this reporting period. Currently, patient is consuming Dys. 3 textures with thin liquids with minimal overt s/s of aspiration and overall Mod verbal cues for use of small bites/sips and a slow rate of self-feeding due to impulsivity. Patient is more interactive and engaged with communication partners and utilizes head nods and gestures consistently to answer yes/no questions. Patient has a variety of multimodal communication apps on his ipad and would benefit from increase utilization of these apps to express more basic wants/needs. Patient has spontaneously verbalized 2 words but has not been able to vocalize on command despite Max A multimodal cues. However, patient demonstrates improved movement of oral-motor musculature and can follow basic commands in regards to oral movements intermittently. Patient demonstrates improved initiation and can complete tasks in a more timely manner but continues to require cues for orientation, suspect due to poor memory and for attention to tasks at times. Patient and family education ongoing. Patient would benefit from continued skilled SLP  intervention to maximize his cognitive-linguistic and swallowing function prior to discharge.    Intensity: Minumum of 1-2 x/day, 30 to 90 minutes Frequency: 3 to 5 out of 7 days Duration/Length of Stay: 11/21/18 Treatment/Interventions: Cognitive remediation/compensation;Dysphagia/aspiration  precaution training;Internal/external aids;Speech/Language facilitation;Therapeutic Activities;Environmental controls;Cueing hierarchy;Functional tasks;Patient/family education   Daily Session  Skilled Therapeutic Interventions: Skilled treatment session focused on communication and dysphagia goals. SLP facilitated session by providing Max A multimodal cues including manipulatives for auditory feedback while attempting to get patient to take a large breath in hopes of facilitating voicing. Despite Max A, patient unable to utilize a large breath or voice on command. However, patient able to open his mouth consistently on command and stuck out his tongue X 1. SLP also facilitated session with a trial tray of Dys. 3 textures. Patient demonstrated efficient mastication with complete oral clearance without overt s/s of aspiration. However, Mod verbal cues needed for use of small bites and pacing at times. Recommend patient upgrade to Dys. 3 textures but continue with full supervision to maximize safety. Patient left upright in wheelchair with alarm on and mom present. Continue with current plan of care.   Pain No/Denies pain   Therapy/Group: Individual Therapy  Aram Domzalski 11/05/2018, 6:40 AM

## 2018-11-05 NOTE — Progress Notes (Signed)
Speech Language Pathology Daily Session Note  Patient Details  Name: Gregory Moreno MRN: [id number] Date of Birth: 02/05/1995  Today's Date: 11/05/2018 SLP Individual Time: 1430-1500 SLP Individual Time Calculation (min): 30 min  Short Term Goals: Week 3: SLP Short Term Goal 1 (Week 3): Patient will vocalize on command in 25% of opportunities with Max multimodal cues. SLP Short Term Goal 2 (Week 3): Patient will utilize multimodal communication to express basic wants/needs with Max A verbal cues. SLP Short Term Goal 3 (Week 3): Patient will demonstrate sustained attention to functional tasks for 30 minutes with Mod verbal cues for redirection. SLP Short Term Goal 4 (Week 3): Patient will demonstrate basic problem solving with Mod verbal cues with functional and familair tasks. SLP Short Term Goal 5 (Week 3): Patient will be oriented to place and time with Mod A verbal cues. SLP Short Term Goal 6 (Week 3): Patient will consume current diet of Dys. 3 textures with thin liquids with minimal overt s/s of aspiration with Min verbal cues for use of compensatory strategies.  Skilled Therapeutic Interventions: Skilled treatment session focused on cognitive goals. SLP facilitated session by providing extra time and Min-Mod A verbal cues for complex problem solving during a novel card task. Patient also utilized gestures to express basic wants/needs with overall supervision verbal cues. Patient left upright in wheelchair with alarm on and all needs within reach. Continue with current plan of care.      Pain No/Denies Pain   Therapy/Group: Individual Therapy  Miro Balderson 11/05/2018, 3:22 PM

## 2018-11-05 NOTE — Progress Notes (Signed)
PHYSICAL MEDICINE & REHABILITATION PROGRESS NOTE  Subjective/Complaints: Rested well. No problems reported overnight  ROS: limited due to language/communication    Objective: Vital Signs: Blood pressure 120/83, pulse 82, temperature 97.9 F (36.6 C), temperature source Oral, resp. rate 16, height 5\' 7"  (1.702 m), weight 72.1 kg, SpO2 97 %. No results found. Recent Labs    11/03/18 0721  WBC 6.7  HGB 11.7*  HCT 36.0*  PLT 210   Recent Labs    11/03/18 0721  NA 138  K 3.7  CL 102  CO2 27  GLUCOSE 81  BUN 15  CREATININE 0.79  CALCIUM 9.4    Physical Exam: BP 120/83 (BP Location: Left Arm)   Pulse 82   Temp 97.9 F (36.6 C) (Oral)   Resp 16   Ht 5\' 7"  (1.702 m)   Wt 72.1 kg   SpO2 97%   BMI 24.90 kg/m  Constitutional: No distress . Vital signs reviewed. HEENT: EOMI, oral membranes moist Neck: supple Cardiovascular: RRR without murmur. No JVD    Respiratory: CTA Bilaterally without wheezes or rales. Normal effort    GI: BS +, non-tender, non-distended  Skin: Warm and dry.  Intact. Psych:more engaging Musc: No edema in extremities.  No tenderness in extremities. Neuro: Alert Makes eye contact Motor:  Dense right hemiparesis. No resting tone today, initiates more easily with left arm and leg.  Assessment/Plan: 1. 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  Physiatrist is providing close team supervision and 24 hour management of active medical problems listed below.  Physiatrist and rehab team continue to assess barriers to discharge/monitor patient progress toward functional and medical goals  Care Tool:  Bathing    Body parts bathed by patient: Right arm, Chest, Abdomen, Left upper leg, Right upper leg, Face   Body parts bathed by helper: Left arm, Front perineal area, Buttocks, Right upper leg, Left  upper leg, Right lower leg, Left lower leg     Bathing assist Assist Level: Maximal Assistance - Patient 24 - 49%     Upper Body Dressing/Undressing Upper body dressing   What is the patient wearing?: Pull over shirt    Upper body assist Assist Level: Moderate Assistance - Patient 50 - 74%    Lower Body Dressing/Undressing Lower body dressing      What is the patient wearing?: Pants     Lower body assist Assist for lower body dressing: Dependent - Patient 0%     Toileting Toileting    Toileting assist Assist for toileting: Dependent - Patient 0%     Transfers Chair/bed transfer  Transfers assist  Chair/bed transfer activity did not occur: Safety/medical concerns  Chair/bed transfer assist level: 2 Helpers     Locomotion Ambulation   Ambulation assist   Ambulation activity did not occur: Safety/medical concerns          Walk 10 feet activity   Assist  Walk 10 feet activity did not occur: Safety/medical concerns        Walk 50 feet activity   Assist Walk 50 feet with 2 turns activity did not occur: Safety/medical concerns         Walk 150 feet activity   Assist Walk 150 feet activity did not occur: Safety/medical concerns         Walk 10 feet on uneven surface  activity   Assist Walk 10  feet on uneven surfaces activity did not occur: Safety/medical concerns         Wheelchair     Assist Will patient use wheelchair at discharge?: Yes Type of Wheelchair: Manual Wheelchair activity did not occur: Safety/medical concerns  Wheelchair assist level: Moderate Assistance - Patient 50 - 74% Max wheelchair distance: 75 ft    Wheelchair 50 feet with 2 turns activity    Assist    Wheelchair 50 feet with 2 turns activity did not occur: Safety/medical concerns   Assist Level: Moderate Assistance - Patient 50 - 74%   Wheelchair 150 feet activity     Assist Wheelchair 150 feet activity did not occur: Safety/medical  concerns          Medical Problem List and Plan: 1.Right greater than left-sided weaknesssecondary to intracerebral hemorrhage with GBM.Surgery was done emergently due to signs of herniation due to notes.Status post left crani for hematoma evacuation and tumor resection 10/13/2018.   Continue CIR. Pt making more gains over the last several days d/t improvements in processing, initiation and attention.  PRAFO, WHO at night  Neuro oncology with extensive and productive discussion with patient and family yesterday regarding diagnosis and prognosis, appreciate assistance   -MRI recommended in 1-2 weeks   -XRT and CTX potentially 4-6 weeks post-op or once max functional gains reached/dc from rehab 2. Antithrombotics: -DVT/anticoagulation:Subcutaneous heparin initiated 10/16/2018 -antiplatelet therapy: N/A 3. Pain Management:Tylenol as needed 4. Mood:Provide emotional support -antipsychotic agents: N/A 5. Neuropsych: This patientis not of making decisions on hisown behalf. 6. Skin/Wound Care:Routine skin checks 7. Fluids/Electrolytes/Nutrition:Routine in and outs  8. Dysphagia. Dysphasia #2 thin liquids. Follow-up speech therapy  Advance diet as tolerated 9. 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 Constipation  Bowel meds as necessary 11. Spasticity  Baclofen 5mg  2 times daily started on 10/9---increased to TID 10/13  -tone controlled---no changes 12. Hiccups  Baclofen as needed  Improved 13.  Steroid-induced hyperglycemia  Continue to monitor with steroid wean 14.  Hyponatremia  Sodium 138 10/19  Continue to monitor 15.  Transaminitis  LFTs   stable-slightly improved on 10/12 16.  Leukocytosis-  steroid-induced  WBCs down to 6.7 10/19  LOS: 15 days A FACE TO [place] 11/05/2018,  8:47 AM

## 2018-11-06 ENCOUNTER — Inpatient Hospital Stay (HOSPITAL_COMMUNITY): Payer: 59 | Admitting: Speech Pathology

## 2018-11-06 ENCOUNTER — Inpatient Hospital Stay (HOSPITAL_COMMUNITY): Payer: 59 | Admitting: Physical Therapy

## 2018-11-06 ENCOUNTER — Inpatient Hospital Stay (HOSPITAL_COMMUNITY): Payer: 59

## 2018-11-06 NOTE — Progress Notes (Signed)
[facility] PHYSICAL MEDICINE & REHABILITATION PROGRESS NOTE  Subjective/Complaints: Up in bed. On tablet reading instagram  ROS: limited due to language/communication   Objective: Vital Signs: Blood pressure 115/70, pulse 78, temperature 98.2 F (36.8 C), resp. rate 17, height 5\' 7"  (1.702 m), weight 72.1 kg, SpO2 96 %. No results found. No results for input(s): WBC, HGB, HCT, PLT in the last 72 hours. No results for input(s): NA, K, CL, CO2, GLUCOSE, BUN, CREATININE, CALCIUM in the last 72 hours.  Physical Exam: BP 115/70 (BP Location: Right Arm)   Pulse 78   Temp 98.2 F (36.8 C)   Resp 17   Ht 5\' 7"  (1.702 m)   Wt 72.1 kg   SpO2 96%   BMI 24.90 kg/m  Constitutional: No distress . Vital signs reviewed. HEENT: EOMI, oral membranes moist Neck: supple Cardiovascular: RRR without murmur. No JVD    Respiratory: CTA Bilaterally without wheezes or rales. Normal effort    GI: BS +, non-tender, non-distended  Skin: Warm and dry.  Intact. Psych:more engaging Musc: No edema in extremities.  No tenderness in extremities. Neuro: Alert, engages much more quickly. Communicates with left hand Motor:  Spastic right hemiparesis  Assessment/Plan: 1. 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  Physiatrist is providing close team supervision and 24 hour management of active medical problems listed below.  Physiatrist and rehab team continue to assess barriers to discharge/monitor patient progress toward functional and medical goals  Care Tool:  Bathing    Body parts bathed by patient: Right arm, Chest, Abdomen, Left upper leg, Right upper leg, Face   Body parts bathed by helper: Left arm, Front perineal area, Buttocks, Right upper leg, Left upper leg, Right lower leg, Left lower leg     Bathing assist Assist Level: Maximal Assistance - Patient 24  - 49%     Upper Body Dressing/Undressing Upper body dressing   What is the patient wearing?: Pull over shirt    Upper body assist Assist Level: Moderate Assistance - Patient 50 - 74%    Lower Body Dressing/Undressing Lower body dressing      What is the patient wearing?: Pants     Lower body assist Assist for lower body dressing: Dependent - Patient 0%     Toileting Toileting    Toileting assist Assist for toileting: Dependent - Patient 0%     Transfers Chair/bed transfer  Transfers assist  Chair/bed transfer activity did not occur: Safety/medical concerns  Chair/bed transfer assist level: 2 Helpers     Locomotion Ambulation   Ambulation assist   Ambulation activity did not occur: Safety/medical concerns  Assist level: 2 helpers Assistive device: Orthosis(rail in hallway) Max distance: 3 ft   Walk 10 feet activity   Assist  Walk 10 feet activity did not occur: Safety/medical concerns        Walk 50 feet activity   Assist Walk 50 feet with 2 turns activity did not occur: Safety/medical concerns         Walk 150 feet activity   Assist Walk 150 feet activity did not occur: Safety/medical concerns         Walk 10 feet on uneven surface  activity   Assist Walk 10 feet on uneven surfaces activity did not occur: Safety/medical concerns         Wheelchair     Assist Will patient use wheelchair  at discharge?: Yes Type of Wheelchair: Manual Wheelchair activity did not occur: Safety/medical concerns  Wheelchair assist level: Moderate Assistance - Patient 50 - 74% Max wheelchair distance: 75 ft    Wheelchair 50 feet with 2 turns activity    Assist    Wheelchair 50 feet with 2 turns activity did not occur: Safety/medical concerns   Assist Level: Moderate Assistance - Patient 50 - 74%   Wheelchair 150 feet activity     Assist Wheelchair 150 feet activity did not occur: Safety/medical concerns          Medical  Problem List and Plan: 1.Right greater than left-sided weaknesssecondary to intracerebral hemorrhage with GBM.Surgery was done emergently due to signs of herniation due to notes.Status post left crani for hematoma evacuation and tumor resection 10/13/2018.   Continue CIR PT, OT, SLP  PRAFO, WHO at night  Neuro oncology with extensive and productive discussion with patient and family yesterday regarding diagnosis and prognosis, appreciate assistance   -MRI recommended in 1+ weeks   -XRT and CTX potentially 4-6 weeks post-op or once max functional gains reached/dc from rehab 2. Antithrombotics: -DVT/anticoagulation:Subcutaneous heparin initiated 10/16/2018 -antiplatelet therapy: N/A 3. Pain Management:Tylenol as needed 4. Mood:Provide emotional support -antipsychotic agents: N/A 5. Neuropsych: This patientis not of making decisions on hisown behalf. 6. Skin/Wound Care:Routine skin checks 7. Fluids/Electrolytes/Nutrition:intake 100% 8. Dysphagia. Dysphasia #2 thin liquids. Follow-up speech therapy  Advance diet as tolerated 9. 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 Constipation  Bowel meds as necessary 11. Spasticity  Baclofen 5mg  2 times daily started on 10/9---increased to TID 10/13  -tone controlled---no changes 12. Hiccups  Baclofen as needed  Improved 13.  Steroid-induced hyperglycemia  Continue to monitor with steroid wean 14.  Hyponatremia  Sodium 138 10/19  Continue to monitor 15.  Transaminitis  LFTs   stable-slightly improved on 10/12 16.  Leukocytosis-  steroid-induced  WBCs down to 6.7 10/19  LOS: 16 days A FACE TO [place] 11/06/2018, 10:15 AM

## 2018-11-06 NOTE — Progress Notes (Signed)
Speech Language Pathology Daily Session Note  Patient Details  Name: Gregory Moreno MRN: [id number] Date of Birth: 04-Feb-1995  Today's Date: 11/06/2018  Session 1: SLP Individual Time: 0725-0800 SLP Individual Time Calculation (min): 35 min   Session 2: SLP Individual Time: [id number] SLP Individual Time Calculation (min): 30 min  Short Term Goals: Week 3: SLP Short Term Goal 1 (Week 3): Patient will vocalize on command in 25% of opportunities with Max multimodal cues. SLP Short Term Goal 2 (Week 3): Patient will utilize multimodal communication to express basic wants/needs with Max A verbal cues. SLP Short Term Goal 3 (Week 3): Patient will demonstrate sustained attention to functional tasks for 30 minutes with Mod verbal cues for redirection. SLP Short Term Goal 4 (Week 3): Patient will demonstrate basic problem solving with Mod verbal cues with functional and familair tasks. SLP Short Term Goal 5 (Week 3): Patient will be oriented to place and time with Mod A verbal cues. SLP Short Term Goal 6 (Week 3): Patient will consume current diet of Dys. 3 textures with thin liquids with minimal overt s/s of aspiration with Min verbal cues for use of compensatory strategies.  Skilled Therapeutic Interventions:  Session 1: Skilled treatment session focused on cognitive and dysphagia goals. SLP facilitated session by providing Mod verbal and tactile cues for motor planning in regards to repositioning himself in bed to maximize safety with PO intake. Patient required set-up assist but self-fed his breakfast meal of Dys. 3 textures with thin liquids without overt s/s of aspiration and Min verbal cues for use of small bites and a slow rate of self-feeding. Recommend patient continue current diet. Patient nonverbal this session but utilized gestures, head nods and subtle facial expressions to express basic wants/needs. Patient left upright in bed with alarm on and all needs within reach. Continue with  current plan of care.   Session 2: Skilled treatment session focused on dysphagia and communication goals. Upon arrival, patient was eating his lunch meal of Dys. 3 textures with thin liquids. Patient consumed meal without overt s/s of aspiration but required Min verbal cues for use of small bites. Recommend patient continue current diet. SLP also facilitated session by providing Max A multimodal cues for facilitation of voicing. Despite multi attempts and Max multimodal cues, patient moved her oral musculature minimally without vocalizing. Patient left upright in bed with all needs within reach and father present. Continue with current plan of care.    Pain    Therapy/Group: Individual Therapy  Georgia Delsignore 11/06/2018, 8:18 AM

## 2018-11-06 NOTE — Progress Notes (Signed)
Social Work Patient ID: Gregory Moreno, male   DOB: 01-15-1996, 23 y.o.   MRN: [id number]  Have reviewed team conference with pt's father and lengthy conversation about pt's current progress and the future concerns given path report.  Parents remain very engaged and are very committed to pt and his progress.  Time spent giving father space to reminisce about son's earlier years and of his accomplishments over the past few years.  Will continue to follow.  Analiese Krupka, LCSW

## 2018-11-06 NOTE — Progress Notes (Signed)
Occupational Therapy Session Note  Patient Details  Name: Gregory Moreno MRN: [id number] Date of Birth: [birth year]  Today's Date: 11/06/2018 OT Individual Time: 1130-1200 OT Individual Time Calculation (min): 30 min    Short Term Goals: Week 1:  OT Short Term Goal 1 (Week 1): Pt will sit EOB/EOM with MOD A during functional dynamic sitting balance task OT Short Term Goal 1 - Progress (Week 1): Not met OT Short Term Goal 2 (Week 1): Pt will sit to stand with MAX A of 1 in stedy OT Short Term Goal 2 - Progress (Week 1): Met OT Short Term Goal 3 (Week 1): Pt will initate washing face with VC only OT Short Term Goal 3 - Progress (Week 1): Met OT Short Term Goal 4 (Week 1): Pt will locate toothbrush with mod multimodal cues for improved visual scanning OT Short Term Goal 4 - Progress (Week 1): Met OT Short Term Goal 5 (Week 1): Pt will completes 1/4 steps of UB dressing OT Short Term Goal 5 - Progress (Week 1): Met  Skilled Therapeutic Interventions/Progress Updates:    1:1. Pt received in bed with no report of pain. Pt agreeable to get up for tx, but indicating wanting to get back in bed at end of session. Pt completes MAX A SBT EOB<>w/c with min VC for trunk flexion/reaching hand placement. Pt with improved partiipation pushing with LUE than pulling. Pt completes 3 sit to stands at rail with mod A overall but MIN A for power up at parallel bar with mirror for visual feedback. Exited session with pt seated in bed, exit alarm on and call light in reach  Therapy Documentation Precautions:  Precautions Precautions: Fall Precaution Comments: L crani Restrictions Weight Bearing Restrictions: No General:   Vital Signs:   Pain: Pain Assessment Pain Scale: 0-10 Pain Score: 0-No pain ADL: ADL Grooming: Maximal assistance Where Assessed-Grooming: Sitting at sink, Wheelchair Upper Body Bathing: Maximal assistance Where Assessed-Upper Body Bathing: Sitting at sink, Wheelchair Lower  Body Bathing: Maximal assistance Where Assessed-Lower Body Bathing: Sitting at sink, Wheelchair Upper Body Dressing: Maximal assistance Where Assessed-Upper Body Dressing: Sitting at sink, Wheelchair Lower Body Dressing: Maximal assistance Where Assessed-Lower Body Dressing: Sitting at sink, Wheelchair Toileting: Unable to assess Toilet Transfer: Dependent(+2) [employer] forces technical officer Method: Charlaine Dalton) Vision   Perception    Praxis   Exercises:   Other Treatments:     Therapy/Group: Individual Therapy  Tonny Branch 11/06/2018, 12:04 PM

## 2018-11-06 NOTE — Progress Notes (Signed)
Occupational Therapy Session Note  Patient Details  Name: Gregory Moreno MRN: [id number] Date of Birth: 1995/10/04  Today's Date: 11/06/2018 OT Individual Time: 1400-1500 OT Individual Time Calculation (min): 60 min    Short Term Goals: Week 1:  OT Short Term Goal 1 (Week 1): Pt will sit EOB/EOM with MOD A during functional dynamic sitting balance task OT Short Term Goal 1 - Progress (Week 1): Not met OT Short Term Goal 2 (Week 1): Pt will sit to stand with MAX A of 1 in stedy OT Short Term Goal 2 - Progress (Week 1): Met OT Short Term Goal 3 (Week 1): Pt will initate washing face with VC only OT Short Term Goal 3 - Progress (Week 1): Met OT Short Term Goal 4 (Week 1): Pt will locate toothbrush with mod multimodal cues for improved visual scanning OT Short Term Goal 4 - Progress (Week 1): Met OT Short Term Goal 5 (Week 1): Pt will completes 1/4 steps of UB dressing OT Short Term Goal 5 - Progress (Week 1): Met  Skilled Therapeutic Interventions/Progress Updates:    1;1. Pt received in bed agreeable to OT. Pt with no indication of pain. Pt completes supine>sitting EOB with MAX A overall with VC for head turning to engage core muscles. Pt completes squat pivot transfer throughout session with max A EOB>TIS<>EOM with VC for reach back. Pt completes EOM game of connect 4 with tactile cues at trunk and pelvis to facilitate rotation when reaching across midline for game pieces. Pt requires up to MAX A for LOB L and anteriorly. Game board placed on far L to encourage weight shift through reaching to reorient to midline. Pt with more frequent LOB with increased time d/t fatigue. sidelying and EOB NMR with max to total A for full ROM of scap pro/retraction, elevation/depression, and shoulder flexion extension. Exited session with pt seated in w/c. Call light in reach and belt alarm on  Therapy Documentation Precautions:  Precautions Precautions: Fall Precaution Comments: L  crani Restrictions Weight Bearing Restrictions: No General:   Vital Signs:   Pain:   ADL: ADL Grooming: Maximal assistance Where Assessed-Grooming: Sitting at sink, Wheelchair Upper Body Bathing: Maximal assistance Where Assessed-Upper Body Bathing: Sitting at sink, Wheelchair Lower Body Bathing: Maximal assistance Where Assessed-Lower Body Bathing: Sitting at sink, Wheelchair Upper Body Dressing: Maximal assistance Where Assessed-Upper Body Dressing: Sitting at sink, Wheelchair Lower Body Dressing: Maximal assistance Where Assessed-Lower Body Dressing: Sitting at sink, Wheelchair Toileting: Unable to assess Toilet Transfer: Dependent(+2) Armed forces technical officer Method: Charlaine Dalton) Vision   Perception    Praxis   Exercises:   Other Treatments:     Therapy/Group: Individual Therapy  Tonny Branch 11/06/2018, 3:09 PM

## 2018-11-06 NOTE — Progress Notes (Signed)
Physical Therapy Session Note  Patient Details  Name: Gregory Moreno MRN: [id number] Date of Birth: 17-Aug-1995  Today's Date: 11/06/2018 PT Individual Time: 0900-1000 PT Individual Time Calculation (min): 60 min   Short Term Goals: Week 3:  PT Short Term Goal 1 (Week 3): Pt will consistently complete all bed mobility tasks with max assist +1. PT Short Term Goal 2 (Week 3): Pt will consistently complete bed<>w/c transfers with max assist +1. PT Short Term Goal 3 (Week 3): Pt will propel w/c 100 ft with min assist.  Skilled Therapeutic Interventions/Progress Updates:   Pt in supine and agreeable to therapy, indicates that he is not in pain. Pt utilized primarily "thumbs up/down" during session to make needs known and for communication. Donned shorts in supine w/ total assist to thread, mod-max assist to bring over hips, pt utilizing LUE appropriately to bring pants over hips on both sides, mod assist for supine bridge. Supine>sit w/ total assist +1 and tactile/verbal cues for technique. Static sitting balance at EOB w/ min assist while therapist donned shoes. Squat pivot to w/c on pt's L w/ max assist +2. Tactile and verbal cues for LUE placement and technique. Pt self-propelled w/c 50' towards therapy gym via L hemi technique and mod assist overall. Verbal and visual cues for technique and for increasing amplitude of movements. Total assist remainder of way.   Worked on gait training at rail w/ R GRAFO. Sit<>stands to rail w/ max-total assist overall, pt increasing independence w/ each rep. Max multimodal cues to reach full upright, including mirror for visual feedback and 2nd therapist providing cues in front of him, and RUE over therapist's shoulder. Max manual assist to bring hips forward and to reach full R extension in stance. Pt reached full upright ~10% of the time. Ambulated 5' x2 and 10' x2 at rail w/ max assist overall for upright and lateral weight shifting, total assist for RLE  management although palpable R quad felt, and 2nd helper w/ w/c follow. 2nd therapist in front also providing cues for increased L step length. Provided w/ sips of water at seated rest breaks.   Returned to room total assist and pt indicating he would like to return to bed. Total assist +1 slide board transfer to EOB. Ended session in supine, all needs in reach.   Therapy Documentation Precautions:  Precautions Precautions: Fall Precaution Comments: L crani Restrictions Weight Bearing Restrictions: No  Therapy/Group: Individual Therapy  Mayola Mcbain Clent Demark 11/06/2018, 10:25 AM

## 2018-11-07 ENCOUNTER — Inpatient Hospital Stay (HOSPITAL_COMMUNITY): Payer: 59 | Admitting: Physical Therapy

## 2018-11-07 ENCOUNTER — Inpatient Hospital Stay (HOSPITAL_COMMUNITY): Payer: 59 | Admitting: Speech Pathology

## 2018-11-07 ENCOUNTER — Inpatient Hospital Stay (HOSPITAL_COMMUNITY): Payer: 59

## 2018-11-07 MED ORDER — DEXAMETHASONE 0.5 MG PO TABS
0.5000 mg | ORAL_TABLET | Freq: Two times a day (BID) | ORAL | Status: DC
  Administered 2018-11-07 – 2018-11-12 (×11): 0.5 mg via ORAL
  Filled 2018-11-07 (×12): qty 1

## 2018-11-07 NOTE — Progress Notes (Signed)
Occupational Therapy Session Note  Patient Details  Name: Gregory Moreno MRN: [id number] Date of Birth: June 30, 1995  Today's Date: 11/07/2018 OT Individual Time: 1447-1530 OT Individual Time Calculation (min): 43 min    Short Term Goals: Week 3:  OT Short Term Goal 1 (Week 3): Pt will thread 1LE into pants with A for sitting balance only OT Short Term Goal 2 (Week 3): Pt will sit to stand wiht MOD A +2 consistently in prep for clothing management OT Short Term Goal 3 (Week 3): Pt will transfer with MOD A of 2 in stedy to decrease caregiver burden OT Short Term Goal 4 (Week 3): Pt will don shirt wiht MIN A OT Short Term Goal 5 (Week 3): Pt will sit EOM dynamically reaching outside BOS during functional activity with MOD A for LOB correction  Skilled Therapeutic Interventions/Progress Updates:    1:1. Pt received in recliner with SLP present. Pt agreeable to OT and no indication of pain. Pt does however indicate need to toilet. STEDY transfer with MOD +2 A sit to stand in stedy with VC for weight shift at hips provided manually and pt transfers onto toilet. Pt able to void bladder and bowel on toilet, however bladder movement spills onto floor. Pt transfers to EOB after total A for hygiene in stedy. Pt completes LB dressing with S-MOD A overall for sitting balance at EOB while dressing. Pt able to indicate hemi dressing attempting to thread LLE first with A to elevate LE. Pt threads RLE into pants. Sit to stand 3x at EOB with MOD A +2 to elevate pants and VC for looking up towards ceiling to facilitate upright posture and terminal hip extension. Exited session with pt seated in bed, exit alarm on and call light in reach  Therapy Documentation Precautions:  Precautions Precautions: Fall Precaution Comments: L crani Restrictions Weight Bearing Restrictions: No General:   Vital Signs: Therapy Vitals Temp: 98.2 F (36.8 C) Temp Source: Oral Pulse Rate: 87 Resp: 18 BP: 129/74 Patient  Position (if appropriate): Sitting Oxygen Therapy SpO2: 100 % O2 Device: Room Air Pain:   ADL: ADL Grooming: Maximal assistance Where Assessed-Grooming: Sitting at sink, Wheelchair Upper Body Bathing: Maximal assistance Where Assessed-Upper Body Bathing: Sitting at sink, Wheelchair Lower Body Bathing: Maximal assistance Where Assessed-Lower Body Bathing: Sitting at sink, Wheelchair Upper Body Dressing: Maximal assistance Where Assessed-Upper Body Dressing: Sitting at sink, Wheelchair Lower Body Dressing: Maximal assistance Where Assessed-Lower Body Dressing: Sitting at sink, Wheelchair Toileting: Unable to assess Toilet Transfer: Dependent(+2) Armed forces technical officer Method: Charlaine Dalton) Vision   Perception    Praxis   Exercises:   Other Treatments:     Therapy/Group: Individual Therapy  Tonny Branch 11/07/2018, 3:42 PM

## 2018-11-07 NOTE — Progress Notes (Signed)
[facility] PHYSICAL MEDICINE & REHABILITATION PROGRESS NOTE  Subjective/Complaints: Looking at his phone when I came in. No new problems  ROS: limited by aphasia   Objective: Vital Signs: Blood pressure 113/73, pulse 85, temperature 98.6 F (37 C), temperature source Oral, resp. rate 16, height 5\' 7"  (1.702 m), weight 72.1 kg, SpO2 99 %. No results found. No results for input(s): WBC, HGB, HCT, PLT in the last 72 hours. No results for input(s): NA, K, CL, CO2, GLUCOSE, BUN, CREATININE, CALCIUM in the last 72 hours.  Physical Exam: BP 113/73 (BP Location: Right Arm)   Pulse 85   Temp 98.6 F (37 C) (Oral)   Resp 16   Ht 5\' 7"  (1.702 m)   Wt 72.1 kg   SpO2 99%   BMI 24.90 kg/m  Constitutional: No distress . Vital signs reviewed. HEENT: EOMI, oral membranes moist Neck: supple Cardiovascular: RRR without murmur. No JVD    Respiratory: CTA Bilaterally without wheezes or rales. Normal effort    GI: BS +, non-tender, non-distended  Skin: Warm and dry.  Intact. Psych:more engaging Musc: No edema in extremities.  No tenderness in extremities. Neuro: Alert, engages much more quickly. Communicates with left hand Motor:  RUE and RLE 0/5, trace resting tone  Assessment/Plan: 1. 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  Physiatrist is providing close team supervision and 24 hour management of active medical problems listed below.  Physiatrist and rehab team continue to assess barriers to discharge/monitor patient progress toward functional and medical goals  Care Tool:  Bathing    Body parts bathed by patient: Right arm, Chest, Abdomen, Left upper leg, Right upper leg, Face   Body parts bathed by helper: Left arm, Front perineal area, Buttocks, Right upper leg, Left upper leg, Right lower leg, Left lower leg     Bathing assist Assist  Level: Maximal Assistance - Patient 24 - 49%     Upper Body Dressing/Undressing Upper body dressing   What is the patient wearing?: Pull over shirt    Upper body assist Assist Level: Moderate Assistance - Patient 50 - 74%    Lower Body Dressing/Undressing Lower body dressing      What is the patient wearing?: Pants     Lower body assist Assist for lower body dressing: Dependent - Patient 0%     Toileting Toileting    Toileting assist Assist for toileting: Dependent - Patient 0%     Transfers Chair/bed transfer  Transfers assist  Chair/bed transfer activity did not occur: Safety/medical concerns  Chair/bed transfer assist level: 2 Helpers     Locomotion Ambulation   Ambulation assist   Ambulation activity did not occur: Safety/medical concerns  Assist level: 2 helpers Assistive device: Other (comment)(rail in hallway) Max distance: 10'   Walk 10 feet activity   Assist  Walk 10 feet activity did not occur: Safety/medical concerns  Assist level: 2 helpers Assistive device: Other (comment)(rail in hallway)   Walk 50 feet activity   Assist Walk 50 feet with 2 turns activity did not occur: Safety/medical concerns         Walk 150 feet activity   Assist Walk 150 feet activity did not occur: Safety/medical concerns         Walk 10 feet on uneven surface  activity   Assist Walk 10 feet on uneven surfaces activity did not occur: Safety/medical concerns  Wheelchair     Assist Will patient use wheelchair at discharge?: Yes Type of Wheelchair: Manual Wheelchair activity did not occur: Safety/medical concerns  Wheelchair assist level: Moderate Assistance - Patient 50 - 74% Max wheelchair distance: 96'    Wheelchair 50 feet with 2 turns activity    Assist    Wheelchair 50 feet with 2 turns activity did not occur: Safety/medical concerns   Assist Level: Moderate Assistance - Patient 50 - 74%   Wheelchair 150 feet  activity     Assist Wheelchair 150 feet activity did not occur: Safety/medical concerns          Medical Problem List and Plan: 1.Right greater than left-sided weaknesssecondary to intracerebral hemorrhage with GBM.Surgery was done emergently due to signs of herniation due to notes.Status post left crani for hematoma evacuation and tumor resection 10/13/2018.   Continue CIR PT, OT, SLP  PRAFO, WHO at night  Neuro oncology with extensive and productive discussion with patient and family yesterday regarding diagnosis and prognosis, appreciate assistance   -MRI recommended next week?   -XRT and CTX potentially 4-6 weeks post-op or once max functional gains reached/dc from rehab 2. Antithrombotics: -DVT/anticoagulation:Subcutaneous heparin initiated 10/16/2018 -antiplatelet therapy: N/A 3. Pain Management:Tylenol as needed 4. Mood:Provide emotional support -antipsychotic agents: N/A 5. Neuropsych: This patientis not of making decisions on hisown behalf. 6. Skin/Wound Care:Routine skin checks 7. Fluids/Electrolytes/Nutrition:intake 100% 8. Dysphagia. Dysphasia #2 thin liquids. 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 Constipation  Bowel meds as necessary 11. Spasticity  Baclofen 5mg  2 times daily started on 10/9---increased to TID 10/13  -tone controlled---no changes 12. Hiccups  Baclofen as needed  Improved 13.  Steroid-induced hyperglycemia  Continue to monitor with steroid wean 14.  Hyponatremia  Sodium 138 10/19  Check labs monday 15.  Transaminitis  LFTs   stable-slightly improved on 10/12 16.  Leukocytosis-  steroid-induced  WBCs down to 6.7 10/19  -check labs Monday   LOS: 17 days A FACE TO FACE EVALUATION WAS PERFORMED  Meredith Staggers 11/07/2018, 10:37  AM

## 2018-11-07 NOTE — Progress Notes (Signed)
Physical Therapy Session Note  Patient Details  Name: Gregory Moreno MRN: [id number] Date of Birth: [birth year]  Today's Date: 11/07/2018 PT Individual Time: 0924-1020 and 1116-1200 PT Individual Time Calculation (min): 56 min and 44 min  Short Term Goals: Week 3:  PT Short Term Goal 1 (Week 3): Pt will consistently complete all bed mobility tasks with max assist +1. PT Short Term Goal 2 (Week 3): Pt will consistently complete bed<>w/c transfers with max assist +1. PT Short Term Goal 3 (Week 3): Pt will propel w/c 100 ft with min assist.  Skilled Therapeutic Interventions/Progress Updates:  Treatment 1: Pt received in bed & agreeable to tx. No behaviors demonstrating pain during session. Assisted pt with donning shorts, teds, socks, shoes & L GRAFO for time management. Pt transfers to sitting EOB with +1 assist and stand pivot to w/c with +2 assist with pt unable to advance LLE to step towards w/c. Transported pt to rail by gym where pt transfers sit<>stand with max assist +1 with increased ease of pushing to standing compared to 2 days ago. Pt ambulates 10 ft x 3 trials with L rail & +2 assist with therapist providing max assist for weight shifting L<>R, blocking R knee A/P to prevent buckling/hyperextension, total assistance for advancement of RLE with cuing for L hand placement on rail to promote upright posture with pt still demonstrating forward flexion & inability to fully extend B hips/knees. Pt demonstrates significant flexor tone in RLE during activity. Then transitioned to pt propelling w/c with L hemi technique with max cuing for technique but pt still demonstrating great difficulty using LLE to steer w/c & requires mod/max assist to prevent pt from running into wall on R. Pt encouraged to sit in w/c until next PT session. Pt left in w/c with chair alarm donned, call bell in reach, mother present in room.   Treatment 2: Pt received in w/c & agreeable to tx. No behaviors demonstrating  pain during session. Pt observed to be incontinent of urine so transferred to standing at sink with +2 assist to allow therapists to change brief & shorts total assist for time management. When standing at sink pt unable to shift pelvis anteriorly and activate B hip extensors to come to upright standing despite multimodal cuing. Provided dependent assist for donning lite gait harness & pt ambulates in hallway in lite gait with +2-3 assist with therapist providing total assist for advancing & stabilizing RLE, weight shifting R, and providing anterior shifting to advance L hip to then allow pt to step LLE. Pt continues to demonstrate forward flexion throughout and unable to shift pelvis forward. At end of session pt left in recliner with BLE elevated, chair alarm donned, and call bell in reach, mother present in room.   Therapy Documentation Precautions:  Precautions Precautions: Fall Precaution Comments: L crani Restrictions Weight Bearing Restrictions: No    Therapy/Group: Individual Therapy  [name] 11/07/2018, 12:13 PM

## 2018-11-07 NOTE — Progress Notes (Signed)
Speech Language Pathology Daily Session Note  Patient Details  Name: FILLIP BOYADJIAN MRN: [id number] Date of Birth: 1995/09/27  Today's Date: 11/07/2018 SLP Individual Time: [id number] SLP Individual Time Calculation (min): 40 min  Short Term Goals: Week 3: SLP Short Term Goal 1 (Week 3): Patient will vocalize on command in 25% of opportunities with Max multimodal cues. SLP Short Term Goal 2 (Week 3): Patient will utilize multimodal communication to express basic wants/needs with Max A verbal cues. SLP Short Term Goal 3 (Week 3): Patient will demonstrate sustained attention to functional tasks for 30 minutes with Mod verbal cues for redirection. SLP Short Term Goal 4 (Week 3): Patient will demonstrate basic problem solving with Mod verbal cues with functional and familair tasks. SLP Short Term Goal 5 (Week 3): Patient will be oriented to place and time with Mod A verbal cues. SLP Short Term Goal 6 (Week 3): Patient will consume current diet of Dys. 3 textures with thin liquids with minimal overt s/s of aspiration with Min verbal cues for use of compensatory strategies.  Skilled Therapeutic Interventions:  Pt was seen for skilled ST targeting goals for dysphagia and communication.  SLP facilitated the session with a functional snack of dys 3 textures and thin liquids to assess toleration of currently prescribed diet.  Pt consumed a graham cracker and sprite via straw with supervision cues for use of swallowing precautions and no overt s/s of aspiration.  Recommend that pt remain on his currently prescribed diet.  Pt was able to replicate prefunctional, non speech oral motor movements in sets of 5 (opening and closing mouth, biting bottom lip, sticking out tongue, puckering lips, smiling) with min assist verbal and visual cues.  He needed up to mod-max assist to alternate between two movements (smiling and puckering lips).  He was unable to initiate voicing despite max assist multimodal cues,  including biofeedback and he needed max to total assist to recognize and correct errors when typing biographical or functional information into the free text app on his I pad.  Pt was left in recliner and handed off to OT for scheduled therapy session.  Continue per current plan of care.    Pain Pain Assessment Pain Scale: 0-10 Pain Score: 0-No pain  Therapy/Group: Individual Therapy  [place] Kuang, Selinda Orion 11/07/2018, 3:45 PM

## 2018-11-08 ENCOUNTER — Inpatient Hospital Stay (HOSPITAL_COMMUNITY): Payer: 59

## 2018-11-08 NOTE — Progress Notes (Signed)
Occupational Therapy Session Note  Patient Details  Name: Gregory Moreno MRN: [id number] Date of Birth: [birth year]  Today's Date: 11/08/2018 OT Individual Time: 1015-1045 OT Individual Time Calculation (min): 30 min    Short Term Goals: Week 1:  OT Short Term Goal 1 (Week 1): Pt will sit EOB/EOM with MOD A during functional dynamic sitting balance task OT Short Term Goal 1 - Progress (Week 1): Not met OT Short Term Goal 2 (Week 1): Pt will sit to stand with MAX A of 1 in stedy OT Short Term Goal 2 - Progress (Week 1): Met OT Short Term Goal 3 (Week 1): Pt will initate washing face with VC only OT Short Term Goal 3 - Progress (Week 1): Met OT Short Term Goal 4 (Week 1): Pt will locate toothbrush with mod multimodal cues for improved visual scanning OT Short Term Goal 4 - Progress (Week 1): Met OT Short Term Goal 5 (Week 1): Pt will completes 1/4 steps of UB dressing OT Short Term Goal 5 - Progress (Week 1): Met  Skilled Therapeutic Interventions/Progress Updates:    1:1. Pt received in bed with thumbs down to pain. Pt agreeable to dressing this session. Pt completes LB dressing with total A for time management at bed level rolling iwht MIN A to R and MAX A to L for CM with VC for cradling elbow in LUE and head turning to engage abdominals. Pt sits EOB  With MOD A for sitting balance while donning shirt iwht MIN A!!! Pt with improved initiation this date follwing commands within 1 second of stating, however continues requirement of VC for hemi dressing. Pt completes squat pivot transfer with MAX A of 1 with improved power up this date and hand placement. Exited session with pt seated in recliner, call light inr each and all needs met  Therapy Documentation Precautions:  Precautions Precautions: Fall Precaution Comments: L crani Restrictions Weight Bearing Restrictions: No General:   Vital Signs:   Pain: Pain Assessment Pain Scale: 0-10 Pain Score: 0-No  pain ADL: ADL Grooming: Maximal assistance Where Assessed-Grooming: Sitting at sink, Wheelchair Upper Body Bathing: Maximal assistance Where Assessed-Upper Body Bathing: Sitting at sink, Wheelchair Lower Body Bathing: Maximal assistance Where Assessed-Lower Body Bathing: Sitting at sink, Wheelchair Upper Body Dressing: Maximal assistance Where Assessed-Upper Body Dressing: Sitting at sink, Wheelchair Lower Body Dressing: Maximal assistance Where Assessed-Lower Body Dressing: Sitting at sink, Wheelchair Toileting: Unable to assess Toilet Transfer: Dependent(+2) Armed forces technical officer Method: Charlaine Dalton) Vision   Perception    Praxis   Exercises:   Other Treatments:     Therapy/Group: Individual Therapy  Tonny Branch 11/08/2018, 10:47 AM

## 2018-11-08 NOTE — Progress Notes (Signed)
Occupational Therapy Session Note  Patient Details  Name: Gregory Moreno MRN: [id number] Date of Birth: 11/24/1995  Today's Date: 11/08/2018 OT Individual Time: 1300-1345 OT Individual Time Calculation (min): 45 min    Short Term Goals: Week 1:  OT Short Term Goal 1 (Week 1): Pt will sit EOB/EOM with MOD A during functional dynamic sitting balance task OT Short Term Goal 1 - Progress (Week 1): Not met OT Short Term Goal 2 (Week 1): Pt will sit to stand with MAX A of 1 in stedy OT Short Term Goal 2 - Progress (Week 1): Met OT Short Term Goal 3 (Week 1): Pt will initate washing face with VC only OT Short Term Goal 3 - Progress (Week 1): Met OT Short Term Goal 4 (Week 1): Pt will locate toothbrush with mod multimodal cues for improved visual scanning OT Short Term Goal 4 - Progress (Week 1): Met OT Short Term Goal 5 (Week 1): Pt will completes 1/4 steps of UB dressing OT Short Term Goal 5 - Progress (Week 1): Met  Skilled Therapeutic Interventions/Progress Updates:    1;1. Pt received in be with no indication of pain but indicated need to have BM. Pt supine>sitting EOB with A for trunk elevation and RLE managemetn. Pt sit to stand in stedy iwht MIN A and MIN VC for midline orientation. tp completes CM in stedy with MOD A for standing balance and VC for trunk extension. Pt attemting to assist with CM after toileting standing in stedy. Pt indicates wanting to use stedy for BMs as opposed to bedpan. RN and NT alerted for MOD A +2 stedy transfer to toilet with increased time for initiation. Pt very smiley today. [employer] seated in all 4 quadrants with overall 2.4 second reaction time seated in w/c with CGA for crossing midline reaching sitting balance. Exited session with pt seated in w/c, call light in reach and all needs met  Therapy Documentation Precautions:  Precautions Precautions: Fall Precaution Comments: L crani Restrictions Weight Bearing Restrictions: No General:   Vital  Signs:   Pain:   ADL: ADL Grooming: Maximal assistance Where Assessed-Grooming: Sitting at sink, Wheelchair Upper Body Bathing: Maximal assistance Where Assessed-Upper Body Bathing: Sitting at sink, Wheelchair Lower Body Bathing: Maximal assistance Where Assessed-Lower Body Bathing: Sitting at sink, Wheelchair Upper Body Dressing: Maximal assistance Where Assessed-Upper Body Dressing: Sitting at sink, Wheelchair Lower Body Dressing: Maximal assistance Where Assessed-Lower Body Dressing: Sitting at sink, Wheelchair Toileting: Unable to assess Toilet Transfer: Dependent(+2) Toilet Transfer Method: (STEDY) Vision   Perception    Praxis   Exercises:   Other Treatments:     Therapy/Group: Individual Therapy   M  11/08/2018, 1:47 PM  

## 2018-11-08 NOTE — Progress Notes (Signed)
[place] PHYSICAL MEDICINE & REHABILITATION PROGRESS NOTE  Subjective/Complaints: Slept well. Just awakening when I came in  ROS: Limited due to cognitive/behavioral    Objective: Vital Signs: Blood pressure 112/61, pulse 71, temperature 97.9 F (36.6 C), temperature source Oral, resp. rate 14, height 5\' 7"  (1.702 m), weight 72.1 kg, SpO2 99 %. No results found. No results for input(s): WBC, HGB, HCT, PLT in the last 72 hours. No results for input(s): NA, K, CL, CO2, GLUCOSE, BUN, CREATININE, CALCIUM in the last 72 hours.  Physical Exam: BP 112/61 (BP Location: Left Arm)   Pulse 71   Temp 97.9 F (36.6 C) (Oral)   Resp 14   Ht 5\' 7"  (1.702 m)   Wt 72.1 kg   SpO2 99%   BMI 24.90 kg/m  Constitutional: No distress . Vital signs reviewed. HEENT: EOMI, oral membranes moist Neck: supple Cardiovascular: RRR without murmur. No JVD    Respiratory: CTA Bilaterally without wheezes or rales. Normal effort    GI: BS +, non-tender, non-distended  Skin: Warm and dry.  Intact. Psych:more engaging Musc: No edema in extremities.  No tenderness in extremities. Neuro: Alert, engages much more quickly. Communicates with left hand Motor:  RUE and RLE 0/5, trace resting tone  Assessment/Plan: 1. 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  Physiatrist is providing close team supervision and 24 hour management of active medical problems listed below.  Physiatrist and rehab team continue to assess barriers to discharge/monitor patient progress toward functional and medical goals  Care Tool:  Bathing    Body parts bathed by patient: Right arm, Chest, Abdomen, Left upper leg, Right upper leg, Face   Body parts bathed by helper: Left arm, Front perineal area, Buttocks, Right upper leg, Left upper leg, Right lower leg, Left lower leg     Bathing assist  Assist Level: Maximal Assistance - Patient 24 - 49%     Upper Body Dressing/Undressing Upper body dressing   What is the patient wearing?: Pull over shirt    Upper body assist Assist Level: Moderate Assistance - Patient 50 - 74%    Lower Body Dressing/Undressing Lower body dressing      What is the patient wearing?: Pants     Lower body assist Assist for lower body dressing: 2 Helpers     Toileting Toileting    Toileting assist Assist for toileting: Dependent - Patient 0%     Transfers Chair/bed transfer  Transfers assist  Chair/bed transfer activity did not occur: Safety/medical concerns  Chair/bed transfer assist level: 2 Helpers     Locomotion Ambulation   Ambulation assist   Ambulation activity did not occur: Safety/medical concerns  Assist level: 2 helpers Assistive device: (rail) Max distance: 10'   Walk 10 feet activity   Assist  Walk 10 feet activity did not occur: Safety/medical concerns  Assist level: 2 helpers Assistive device: Other (comment)(rail in hallway)   Walk 50 feet activity   Assist Walk 50 feet with 2 turns activity did not occur: Safety/medical concerns         Walk 150 feet activity   Assist Walk 150 feet activity did not occur: Safety/medical concerns         Walk 10 feet on uneven surface  activity   Assist Walk 10 feet on uneven surfaces activity did not occur: Safety/medical concerns  Wheelchair     Assist Will patient use wheelchair at discharge?: Yes Type of Wheelchair: Manual Wheelchair activity did not occur: Safety/medical concerns  Wheelchair assist level: Moderate Assistance - Patient 50 - 74% Max wheelchair distance: 24'    Wheelchair 50 feet with 2 turns activity    Assist    Wheelchair 50 feet with 2 turns activity did not occur: Safety/medical concerns   Assist Level: Moderate Assistance - Patient 50 - 74%   Wheelchair 150 feet activity     Assist Wheelchair  150 feet activity did not occur: Safety/medical concerns          Medical Problem List and Plan: 1.Right greater than left-sided weaknesssecondary to intracerebral hemorrhage with GBM.Surgery was done emergently due to signs of herniation due to notes.Status post left crani for hematoma evacuation and tumor resection 10/13/2018.   Continue CIR PT, OT, SLP  PRAFO, WHO at night  Neuro oncology with extensive and productive discussion with patient and family yesterday regarding diagnosis and prognosis, appreciate assistance   -communicated with Dr. [name] last night. Will order another MRI this coming week   -XRT and CTX potentially 4-6 weeks post-op or once max functional gains reached/dc from rehab 2. Antithrombotics: -DVT/anticoagulation:Subcutaneous heparin initiated 10/16/2018 -antiplatelet therapy: N/A 3. Pain Management:Tylenol as needed 4. Mood:Provide emotional support -antipsychotic agents: N/A 5. Neuropsych: This patientis not of making decisions on hisown behalf. 6. Skin/Wound Care:Routine skin checks 7. Fluids/Electrolytes/Nutrition:intake 100% 8. Dysphagia. Dysphasia #2 thin liquids. 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 Constipation  Bowel meds as necessary 11. Spasticity  Baclofen 5mg  2 times daily started on 10/9---increased to TID 10/13  -tone controlled---no changes 12. Hiccups  Baclofen as needed  Improved 13.  Steroid-induced hyperglycemia  Continue to monitor with steroid wean 14.  Hyponatremia  Sodium 138 10/19  Check labs monday 15.  Transaminitis  LFTs   stable-slightly improved on 10/12 16.  Leukocytosis-  steroid-induced  WBCs down to 6.7 10/19  -check labs Monday   LOS: 18 days A FACE TO FACE EVALUATION WAS PERFORMED  Meredith Staggers 11/08/2018, 9:01 AM

## 2018-11-09 ENCOUNTER — Inpatient Hospital Stay (HOSPITAL_COMMUNITY): Payer: 59 | Admitting: Speech Pathology

## 2018-11-09 ENCOUNTER — Inpatient Hospital Stay (HOSPITAL_COMMUNITY): Payer: 59

## 2018-11-09 NOTE — Progress Notes (Signed)
Occupational Therapy Session Note  Patient Details  Name: Gregory Moreno MRN: [id number] Date of Birth: [birth year]  Today's Date: 11/09/2018 OT Individual Time: 1015-1105 OT Individual Time Calculation (min): 50 min    Skilled Therapeutic Interventions/Progress Updates:    1:1. Pt received in bed agreeable to OT today and VERBALLY responding at phrase and sentence level!!! Pt requesting to shower and toilet this session. Pt requires MIN A for trunk elevation/RLE management. Pt transfers onto toilet and eventually BSC on shower with A for midline orientation. Pt voids bowel on toilet with total A for CM/hygeine. In shower d/t time management pt requires A for washing BLE and buttocks and LUE. Pt washes hair, chest, and RUE. Pt transfers back to bed in stedy and dons gown and brief for time management. Exited session with pt seated in bed exit alarm on and call light in reach.  Therapy Documentation Precautions:  Precautions Precautions: Fall Precaution Comments: L crani Restrictions Weight Bearing Restrictions: No General:   Vital Signs:   Pain: Pain Assessment Pain Scale: 0-10 Pain Score: 0-No pain ADL: ADL Grooming: Maximal assistance Where Assessed-Grooming: Sitting at sink, Wheelchair Upper Body Bathing: Maximal assistance Where Assessed-Upper Body Bathing: Sitting at sink, Wheelchair Lower Body Bathing: Maximal assistance Where Assessed-Lower Body Bathing: Sitting at sink, Wheelchair Upper Body Dressing: Maximal assistance Where Assessed-Upper Body Dressing: Sitting at sink, Wheelchair Lower Body Dressing: Maximal assistance Where Assessed-Lower Body Dressing: Sitting at sink, Wheelchair Toileting: Unable to assess Toilet Transfer: Dependent(+2) Armed forces technical officer Method: Charlaine Dalton) Vision   Perception    Praxis   Exercises:   Other Treatments:     Therapy/Group: Individual Therapy  Tonny Branch 11/09/2018, 12:04 PM

## 2018-11-09 NOTE — Progress Notes (Signed)
Speech Language Pathology Daily Session Note  Patient Details  Name: Gregory Moreno MRN: [id number] Date of Birth: 1995-07-09  Today's Date: 11/09/2018 SLP Individual Time: 0830-0900 SLP Individual Time Calculation (min): 30 min  Short Term Goals: Week 3: SLP Short Term Goal 1 (Week 3): Patient will vocalize on command in 25% of opportunities with Max multimodal cues. SLP Short Term Goal 2 (Week 3): Patient will utilize multimodal communication to express basic wants/needs with Max A verbal cues. SLP Short Term Goal 3 (Week 3): Patient will demonstrate sustained attention to functional tasks for 30 minutes with Mod verbal cues for redirection. SLP Short Term Goal 4 (Week 3): Patient will demonstrate basic problem solving with Mod verbal cues with functional and familair tasks. SLP Short Term Goal 5 (Week 3): Patient will be oriented to place and time with Mod A verbal cues. SLP Short Term Goal 6 (Week 3): Patient will consume current diet of Dys. 3 textures with thin liquids with minimal overt s/s of aspiration with Min verbal cues for use of compensatory strategies.  Skilled Therapeutic Interventions: Skilled treatment session focused on speech goals. Patient spontaneously initiated voicing today and required extra time and Mod verbal cues to answer basic yes/no question verbally!  Patient named functional items with 80% accuracy and Max A sentence completion and written cues. Perseveration and phonemic paraphasias noted without awareness from patient. Mod verbal cues were also needed for use of an increased vocal intensity to maximize intelligibility. Patient left upright in bed with alarm on and RN present. Continue with current plan of care.      Pain No/Denies Pain   Therapy/Group: Individual Therapy  Davine Sweney 11/09/2018, 10:43 AM

## 2018-11-09 NOTE — Progress Notes (Signed)
Occupational Therapy Session Note  Patient Details  Name: Gregory Moreno MRN: [id number] Date of Birth: 09/22/1995  Today's Date: 11/09/2018 OT Individual Time: 1400-1500 OT Individual Time Calculation (min): 60 min    Short Term Goals: Week 3:  OT Short Term Goal 1 (Week 3): Pt will thread 1LE into pants with A for sitting balance only OT Short Term Goal 2 (Week 3): Pt will sit to stand wiht MOD A +2 consistently in prep for clothing management OT Short Term Goal 3 (Week 3): Pt will transfer with MOD A of 2 in stedy to decrease caregiver burden OT Short Term Goal 4 (Week 3): Pt will don shirt wiht MIN A OT Short Term Goal 5 (Week 3): Pt will sit EOM dynamically reaching outside BOS during functional activity with MOD A for LOB correction  Skilled Therapeutic Interventions/Progress Updates:    1:1. Pt received in bed with RN present. Pt agreeable to dressing this date. Pt completes stand pivot transfer with MOD A for sit to stand and MAX A for pivot with R knee block and VC for weight shifting to pivot feet. Pt completes donning pants with MOD-MAX A to assume seated figure 4 and threads BLE into pants. With demo of 1 handed sock donning technique pt able ot don B socks and shoes. Pt sit to stand with MOD A and OT advances pants past hips. Pt completes donning shirt with MIN A for threading R sleeve into long sleeve shirt. Pt sit to stand 5 trials at high low table with cue to "be taller than me" and push hips to touch table edge. Pt able to weight shift B in prep for finctional mobility with R knee block and MOD A for standing balance. Exited session with tp seated in w/c, exit alarm on and call light tin reach  Therapy Documentation Precautions:  Precautions Precautions: Fall Precaution Comments: L crani Restrictions Weight Bearing Restrictions: No General:   Vital Signs: Therapy Vitals Temp: 98.3 F (36.8 C) Pulse Rate: (!) 105 Resp: 18 BP: 126/75 Patient Position (if  appropriate): Sitting Oxygen Therapy SpO2: 98 % O2 Device: Room Air Pain:   ADL: ADL Grooming: Maximal assistance Where Assessed-Grooming: Sitting at sink, Wheelchair Upper Body Bathing: Maximal assistance Where Assessed-Upper Body Bathing: Sitting at sink, Wheelchair Lower Body Bathing: Maximal assistance Where Assessed-Lower Body Bathing: Sitting at sink, Wheelchair Upper Body Dressing: Maximal assistance Where Assessed-Upper Body Dressing: Sitting at sink, Wheelchair Lower Body Dressing: Maximal assistance Where Assessed-Lower Body Dressing: Sitting at sink, Wheelchair Toileting: Unable to assess Toilet Transfer: Dependent(+2) Armed forces technical officer Method: Charlaine Dalton) Vision   Perception    Praxis   Exercises:   Other Treatments:     Therapy/Group: Individual Therapy  Tonny Branch 11/09/2018, 3:01 PM

## 2018-11-09 NOTE — Progress Notes (Signed)
[place] PHYSICAL MEDICINE & REHABILITATION PROGRESS NOTE  Subjective/Complaints: No problems overnight. Awake when I arrived. Smiling  ROS: limited due to language/communication     Objective: Vital Signs: Blood pressure 119/70, pulse 75, temperature 98.2 F (36.8 C), resp. rate 16, height 5\' 7"  (1.702 m), weight 72.1 kg, SpO2 97 %. No results found. No results for input(s): WBC, HGB, HCT, PLT in the last 72 hours. No results for input(s): NA, K, CL, CO2, GLUCOSE, BUN, CREATININE, CALCIUM in the last 72 hours.  Physical Exam: BP 119/70 (BP Location: Right Arm)   Pulse 75   Temp 98.2 F (36.8 C)   Resp 16   Ht 5\' 7"  (1.702 m)   Wt 72.1 kg   SpO2 97%   BMI 24.90 kg/m  Constitutional: No distress . Vital signs reviewed. HEENT: EOMI, oral membranes moist Neck: supple Cardiovascular: RRR without murmur. No JVD    Respiratory: CTA Bilaterally without wheezes or rales. Normal effort    GI: BS +, non-tender, non-distended   Skin: Warm and dry.  Intact. Psych:more engaging Musc: No edema in extremities.  No tenderness in extremities. Neuro: Alert, engages more. Used heads nods. Believed he said "ahh hum" to one of my questions.  Communicates with left thumb too Motor:  RUE and RLE 0/5, trace resting tone---no change  Assessment/Plan: 1. 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  Physiatrist is providing close team supervision and 24 hour management of active medical problems listed below.  Physiatrist and rehab team continue to assess barriers to discharge/monitor patient progress toward functional and medical goals  Care Tool:  Bathing    Body parts bathed by patient: Right arm, Chest, Abdomen, Left upper leg, Right upper leg, Face   Body parts bathed by helper: Left arm, Front perineal area, Buttocks, Right upper leg, Left upper  leg, Right lower leg, Left lower leg     Bathing assist Assist Level: Maximal Assistance - Patient 24 - 49%     Upper Body Dressing/Undressing Upper body dressing   What is the patient wearing?: Pull over shirt    Upper body assist Assist Level: Minimal Assistance - Patient > 75%    Lower Body Dressing/Undressing Lower body dressing      What is the patient wearing?: Pants     Lower body assist Assist for lower body dressing: Total Assistance - Patient < 25%     Toileting Toileting    Toileting assist Assist for toileting: Total Assistance - Patient < 25%     Transfers Chair/bed transfer  Transfers assist  Chair/bed transfer activity did not occur: Safety/medical concerns  Chair/bed transfer assist level: 2 Helpers     Locomotion Ambulation   Ambulation assist   Ambulation activity did not occur: Safety/medical concerns  Assist level: 2 helpers Assistive device: (rail) Max distance: 10'   Walk 10 feet activity   Assist  Walk 10 feet activity did not occur: Safety/medical concerns  Assist level: 2 helpers Assistive device: Other (comment)(rail in hallway)   Walk 50 feet activity   Assist Walk 50 feet with 2 turns activity did not occur: Safety/medical concerns         Walk 150 feet activity   Assist Walk 150 feet activity did not occur: Safety/medical concerns         Walk 10 feet on uneven surface  activity   Assist Walk 10  feet on uneven surfaces activity did not occur: Safety/medical concerns         Wheelchair     Assist Will patient use wheelchair at discharge?: Yes Type of Wheelchair: Manual Wheelchair activity did not occur: Safety/medical concerns  Wheelchair assist level: Moderate Assistance - Patient 50 - 74% Max wheelchair distance: 57'    Wheelchair 50 feet with 2 turns activity    Assist    Wheelchair 50 feet with 2 turns activity did not occur: Safety/medical concerns   Assist Level: Moderate  Assistance - Patient 50 - 74%   Wheelchair 150 feet activity     Assist Wheelchair 150 feet activity did not occur: Safety/medical concerns          Medical Problem List and Plan: 1.Right greater than left-sided weaknesssecondary to intracerebral hemorrhage with GBM.Surgery was done emergently due to signs of herniation due to notes.Status post left crani for hematoma evacuation and tumor resection 10/13/2018.   Continue CIR PT, OT, SLP--making gains in communication/initiation  PRAFO, WHO at night  Neuro oncology with extensive and productive discussion with patient and family yesterday regarding diagnosis and prognosis, appreciate assistance   -communicated with Dr. Pearlean Brownie. Will order another MRI this coming week   -XRT and CTX potentially 4-6 weeks post-op or once max functional gains reached/dc from rehab 2. Antithrombotics: -DVT/anticoagulation:Subcutaneous heparin initiated 10/16/2018 -antiplatelet therapy: N/A 3. Pain Management:Tylenol as needed 4. Mood:Provide emotional support -antipsychotic agents: N/A 5. Neuropsych: This patientis not of making decisions on hisown behalf. 6. Skin/Wound Care:Routine skin checks 7. Fluids/Electrolytes/Nutrition:intake 100% 8. Dysphagia. Dysphasia #2 thin liquids. 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 Constipation  Bowel meds as necessary 11. Spasticity  Baclofen 5mg  2 times daily started on 10/9---increased to TID 10/13  -tone controlled---no changes to regimen 12. Hiccups  Baclofen as needed  Improved 13.  Steroid-induced hyperglycemia  Continue to monitor with steroid wean 14.  Hyponatremia  Sodium 138 10/19  Check labs monday 15.  Transaminitis  LFTs   stable-slightly improved on 10/12 16.  Leukocytosis-   steroid-induced  WBCs down to 6.7 10/19  -check labs Monday   LOS: 19 days A FACE TO FACE EVALUATION WAS PERFORMED  Meredith Staggers 11/09/2018, 8:42 AM

## 2018-11-10 ENCOUNTER — Inpatient Hospital Stay (HOSPITAL_COMMUNITY): Payer: 59 | Admitting: Occupational Therapy

## 2018-11-10 ENCOUNTER — Inpatient Hospital Stay (HOSPITAL_COMMUNITY): Payer: 59 | Admitting: Speech Pathology

## 2018-11-10 ENCOUNTER — Inpatient Hospital Stay (HOSPITAL_COMMUNITY): Payer: 59

## 2018-11-10 ENCOUNTER — Inpatient Hospital Stay (HOSPITAL_COMMUNITY): Payer: 59 | Admitting: Physical Therapy

## 2018-11-10 LAB — BASIC METABOLIC PANEL
Anion gap: 11 (ref 5–15)
BUN: 15 mg/dL (ref 6–20)
CO2: 27 mmol/L (ref 22–32)
Calcium: 9.8 mg/dL (ref 8.9–10.3)
Chloride: 100 mmol/L (ref 98–111)
Creatinine, Ser: 0.84 mg/dL (ref 0.61–1.24)
GFR calc Af Amer: >60 mL/min (ref >60)
GFR calc non Af Amer: >60 mL/min (ref >60)
Glucose, Bld: 100 mg/dL — ABNORMAL HIGH (ref 70–99)
Potassium: 3.9 mmol/L (ref 3.5–5.1)
Sodium: 138 mmol/L (ref 135–145)

## 2018-11-10 LAB — CBC
HCT: 39.9 % (ref 39.0–52.0)
Hemoglobin: 13 g/dL (ref 13.0–17.0)
MCH: 29.3 pg (ref 26.0–34.0)
MCHC: 32.6 g/dL (ref 30.0–36.0)
MCV: 89.9 fL (ref 80.0–100.0)
Platelets: 214 10*3/uL (ref 150–400)
RBC: 4.44 MIL/uL (ref 4.22–5.81)
RDW: 13.5 % (ref 11.5–15.5)
WBC: 8 10*3/uL (ref 4.0–10.5)
nRBC: 0 % (ref 0.0–0.2)

## 2018-11-10 NOTE — Progress Notes (Signed)
Occupational Therapy Session Note  Patient Details  Name: Gregory Moreno MRN: [id number] Date of Birth: 19-Feb-1995  Today's Date: 11/10/2018 OT Individual Time: 1503-1600 OT Individual Time Calculation (min): 57 min    Short Term Goals: Week 3:  OT Short Term Goal 1 (Week 3): Pt will thread 1LE into pants with A for sitting balance only OT Short Term Goal 2 (Week 3): Pt will sit to stand wiht MOD A +2 consistently in prep for clothing management OT Short Term Goal 3 (Week 3): Pt will transfer with MOD A of 2 in stedy to decrease caregiver burden OT Short Term Goal 4 (Week 3): Pt will don shirt wiht MIN A OT Short Term Goal 5 (Week 3): Pt will sit EOM dynamically reaching outside BOS during functional activity with MOD A for LOB correction  Skilled Therapeutic Interventions/Progress Updates:    Upon entering the room, pt seated in wheelchair awaiting OT arrival with mother present in room. Pt propelled wheelchair 100' with L UE and L LE and min A for safety. Pt engaged in sit <>stand x 5 reps with therapist seated in front of him with R knee blocked. Pt standing with max A but progressing to mod A with increased initiation. Mirror placed for visual feedback as pt standing 1-2 minutes each time with min - mod A to correct upright posture. Pt standing x 3 at table for bean bag sorting with R UE placed into weight bearing and mod - max A for midline orientation and positioning secondary to fatigue. Pt propelled self back to room at end of session in same manner as above. Pt more verbal this session when asked questions but does need choice of 2 fifty percent of the time. Pt seated in wheelchair with chair alarm belt donned and call bell within reach.   Therapy Documentation Precautions:  Precautions Precautions: Fall Precaution Comments: L crani Restrictions Weight Bearing Restrictions: No Vital Signs: Therapy Vitals Temp: 98 F (36.7 C) Pulse Rate: 96 Resp: 17 BP: 109/71 Patient  Position (if appropriate): Sitting Oxygen Therapy SpO2: 99 % O2 Device: Room Air ADL: ADL Grooming: Maximal assistance Where Assessed-Grooming: Sitting at sink, Wheelchair Upper Body Bathing: Maximal assistance Where Assessed-Upper Body Bathing: Sitting at sink, Wheelchair Lower Body Bathing: Maximal assistance Where Assessed-Lower Body Bathing: Sitting at sink, Wheelchair Upper Body Dressing: Maximal assistance Where Assessed-Upper Body Dressing: Sitting at sink, Wheelchair Lower Body Dressing: Maximal assistance Where Assessed-Lower Body Dressing: Sitting at sink, Wheelchair Toileting: Unable to assess Toilet Transfer: Dependent(+2) Toilet Transfer Method: (STEDY)   Therapy/Group: Individual Therapy  [name] 11/10/2018, 4:21 PM

## 2018-11-10 NOTE — Progress Notes (Signed)
[facility] PHYSICAL MEDICINE & REHABILITATION PROGRESS NOTE  Subjective/Complaints: Pt reports he's eating (per staff eating EVERYTHING); denies problems with sleep or pain; denies HA.  ROS: limited due to language/communication     Objective: Vital Signs: Blood pressure 111/78, pulse 74, temperature 98 F (36.7 C), temperature source Oral, resp. rate 16, height 5\' 7"  (1.702 m), weight 72.1 kg, SpO2 100 %. No results found. Recent Labs    11/10/18 0819  WBC 8.0  HGB 13.0  HCT 39.9  PLT 214   Recent Labs    11/10/18 0819  NA 138  K 3.9  CL 100  CO2 27  GLUCOSE 100*  BUN 15  CREATININE 0.84  CALCIUM 9.8    Physical Exam: BP 111/78 (BP Location: Left Arm)   Pulse 74   Temp 98 F (36.7 C) (Oral)   Resp 16   Ht 5\' 7"  (1.702 m)   Wt 72.1 kg   SpO2 100%   BMI 24.90 kg/m  Constitutional: No distress . Vital signs and labs reviewed. In manual w/c heading down hall with PT, NAD HEENT: EOMI, oral membranes moist Neck: supple Cardiovascular: RRR without murmur. No JVD    Respiratory: CTA Bilaterally without wheezes or rales. Normal effort    GI: BS +, non-tender, non-distended   Skin: Warm and dry.  Intact. Psych:more engaging; smiling Musc: No edema in extremities.  No tenderness in extremities. Neuro: Alert, engages more. Used heads nods. Responded with single words yes and no.  Communicates with left thumb too Motor:  RUE and RLE 0/5, trace resting tone---no change  Assessment/Plan: 1. 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  Physiatrist is providing close team supervision and 24 hour management of active medical problems listed below.  Physiatrist and rehab team continue to assess barriers to discharge/monitor patient progress toward functional and medical goals  Care Tool:  Bathing    Body parts bathed by patient:  Right arm, Chest, Abdomen, Left upper leg, Right upper leg, Face, Front perineal area   Body parts bathed by helper: Left arm, Buttocks, Right lower leg, Left lower leg     Bathing assist Assist Level: Moderate Assistance - Patient 50 - 74%     Upper Body Dressing/Undressing Upper body dressing   What is the patient wearing?: Pull over shirt    Upper body assist Assist Level: Minimal Assistance - Patient > 75%    Lower Body Dressing/Undressing Lower body dressing      What is the patient wearing?: Pants     Lower body assist Assist for lower body dressing: Moderate Assistance - Patient 50 - 74%     Toileting Toileting    Toileting assist Assist for toileting: Total Assistance - Patient < 25%     Transfers Chair/bed transfer  Transfers assist  Chair/bed transfer activity did not occur: Safety/medical concerns  Chair/bed transfer assist level: Maximal Assistance - Patient 25 - 49%     Locomotion Ambulation   Ambulation assist   Ambulation activity did not occur: Safety/medical concerns  Assist level: 2 helpers Assistive device: Other (comment)(rail in hallway) Max distance: 10'   Walk 10 feet activity   Assist  Walk 10 feet activity did not occur: Safety/medical concerns  Assist level: 2 helpers Assistive device: Other (comment)(rail in hallway)   Walk 50 feet activity   Assist Walk 50 feet with 2 turns activity did not  occur: Safety/medical concerns         Walk 150 feet activity   Assist Walk 150 feet activity did not occur: Safety/medical concerns         Walk 10 feet on uneven surface  activity   Assist Walk 10 feet on uneven surfaces activity did not occur: Safety/medical concerns         Wheelchair     Assist Will patient use wheelchair at discharge?: Yes Type of Wheelchair: Manual Wheelchair activity did not occur: Safety/medical concerns  Wheelchair assist level: Minimal Assistance - Patient > 75% Max wheelchair  distance: 150'    Wheelchair 50 feet with 2 turns activity    Assist    Wheelchair 50 feet with 2 turns activity did not occur: Safety/medical concerns   Assist Level: Minimal Assistance - Patient > 75%   Wheelchair 150 feet activity     Assist Wheelchair 150 feet activity did not occur: Safety/medical concerns   Assist Level: Minimal Assistance - Patient > 75%      Medical Problem List and Plan: 1.Right greater than left-sided weaknesssecondary to intracerebral hemorrhage with GBM.Surgery was done emergently due to signs of herniation due to notes.Status post left crani for hematoma evacuation and tumor resection 10/13/2018.   Continue CIR PT, OT, SLP--making gains in communication/initiation  PRAFO, WHO at night  Neuro oncology with extensive and productive discussion with patient and family yesterday regarding diagnosis and prognosis, appreciate assistance   -communicated with Dr. Pearlean Brownie. Will order another MRI this coming week   -XRT and CTX potentially 4-6 weeks post-op or once max functional gains reached/dc from rehab 2. Antithrombotics: -DVT/anticoagulation:Subcutaneous heparin initiated 10/16/2018 -antiplatelet therapy: N/A 3. Pain Management:Tylenol as needed 4. Mood:Provide emotional support -antipsychotic agents: N/A 5. Neuropsych: This patientis not of making decisions on hisown behalf. 6. Skin/Wound Care:Routine skin checks 7. Fluids/Electrolytes/Nutrition:intake 100% 8. Dysphagia. Dysphasia #2 thin liquids. 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 Constipation  Bowel meds as necessary 11. Spasticity  Baclofen 5mg  2 times daily started on 10/9---increased to TID 10/13  -tone controlled---no changes to regimen 12. Hiccups  Baclofen as  needed  Improved 13.  Steroid-induced hyperglycemia  Continue to monitor with steroid wean 14.  Hyponatremia  Sodium 138 10/19  10/26- Na 138  Check labs monday 15.  Transaminitis  LFTs   stable-slightly improved on 10/12 16.  Leukocytosis-  steroid-induced  WBCs down to 6.7 10/19  -10/26- WBC 8k   LOS: 20 days A FACE TO FACE EVALUATION WAS PERFORMED  Akansha Wyche 11/10/2018, 11:57 AM

## 2018-11-10 NOTE — Plan of Care (Signed)
  Problem: RH BOWEL ELIMINATION Goal: RH STG MANAGE BOWEL W/MEDICATION W/ASSISTANCE Description: STG Manage Bowel with Medication with max Assistance. Outcome: Progressing   Problem: RH BLADDER ELIMINATION Goal: RH STG MANAGE BLADDER WITH ASSISTANCE Description: STG Manage Bladder With max Assistance Outcome: Progressing   Problem: RH SKIN INTEGRITY Goal: RH STG MAINTAIN SKIN INTEGRITY WITH ASSISTANCE Description: STG Maintain Skin Integrity With max Assistance. Outcome: Progressing   Problem: RH SAFETY Goal: RH STG ADHERE TO SAFETY PRECAUTIONS W/ASSISTANCE/DEVICE Description: STG Adhere to Safety Precautions With max Assistance/Device. Outcome: Progressing   Problem: RH COGNITION-NURSING Goal: RH STG ANTICIPATES NEEDS/CALLS FOR ASSIST W/ASSIST/CUES Description: STG Anticipates Needs/Calls for Assist With max Assistance/Cues. Outcome: Progressing   Problem: RH PAIN MANAGEMENT Goal: RH STG PAIN MANAGED AT OR BELOW [name] PAIN GOAL Description: Less than 3 out of 10  Outcome: Progressing

## 2018-11-10 NOTE — Progress Notes (Signed)
Physical Therapy Session Note  Patient Details  Name: Gregory Moreno MRN: [id number] Date of Birth: [birth year]  Today's Date: 11/10/2018 PT Individual Time: 0900-1000 PT Individual Time Calculation (min): 60 min   Short Term Goals: Week 3:  PT Short Term Goal 1 (Week 3): Pt will consistently complete all bed mobility tasks with max assist +1. PT Short Term Goal 2 (Week 3): Pt will consistently complete bed<>w/c transfers with max assist +1. PT Short Term Goal 3 (Week 3): Pt will propel w/c 100 ft with min assist.  Skilled Therapeutic Interventions/Progress Updates:   Pt in supine and agreeable to therapy, denies pain. Pt able to verbalize needs and responses this session, pt did not spontaneously speak but verbally responded to all questions this session w/ either one-word answers or simple phrases that were appropriate. Requesting to don pants vs shorts. Total assist to thread pants and to bring over hips on R side, pt performed supine bridge and brought pants over hips on L side w/o assist from therapist. Supine>sit w/ min assist and increased time, pt very pleased he was able to do this almost by himself. Stand pivot to w/c w/ mod-max assist needed to boost and then block R knee while he took L step to pivot. Pt self-propelled w/c x 75' w/ supervision via L hemi technique. Pt w/ increased and more functional propulsion speed this session but continues to need cues to speed up and increased amplitude of movements for energy conservation. Ambulated 10' at rail w/ mod manual assist for upright balance and lateral weight shifting, min tactile and verbal cues for upright posture, and total assist for RLE step placement and knee extension in stance. Pt w/ palpable R quad contraction in stance and able to initiate R hip flexion for swing ~50% of the time. Attempted to work on R quad activation while kicking a ball in seated, however pt unable suspect 2/2 apraxia. Performed NuStep 8 min @ level 1 w/  BLEs and LUE to work on BLE strengthening, RLE muscle activation, and BLE limb dissociation. Verbal and tactile cues for R quad activation, none to very minimal palpated. Pt able to maintain neutral RLE alignment w/o assist 1st 4 minutes, then needed min-mod assist to maintain neutral remainder of task. Pt self-propelled w/c 150' back to room w/ min assist overall, same cues as detailed above but min manual assist needed for steering. Pt w/ frequent veering to R side that was not present at beginning of session. Ended session in recliner, all needs in reach and in care of mother.   Therapy Documentation Precautions:  Precautions Precautions: Fall Precaution Comments: L crani Restrictions Weight Bearing Restrictions: No Pain: Pain Assessment Pain Scale: Faces Faces Pain Scale: No hurt  Therapy/Group: Individual Therapy  Dafne Nield K Maleiya Pergola 11/10/2018, 10:14 AM

## 2018-11-10 NOTE — Progress Notes (Signed)
Physical Therapy Session Note  Patient Details  Name: Gregory Moreno MRN: [id number] Date of Birth: 1995/02/05  Today's Date: 11/10/2018 PT Individual Time: 1117-1203 PT Individual Time Calculation (min): 46 min   Short Term Goals: Week 3:  PT Short Term Goal 1 (Week 3): Pt will consistently complete all bed mobility tasks with max assist +1. PT Short Term Goal 2 (Week 3): Pt will consistently complete bed<>w/c transfers with max assist +1. PT Short Term Goal 3 (Week 3): Pt will propel w/c 100 ft with min assist.  Skilled Therapeutic Interventions/Progress Updates:  Pt received in recliner with pt's mother present & reporting pt's condom catheter had fallen off. Pt transfers sit<>stand with use of stedy and min assist with good ability to power up with BLE & pull with LUE. Pt is able to maintain static sitting balance in stedy & EOB with BLE & LUE support with close supervision<>min assist with pt demonstrating occasional posterior/L LOB. Therapist provides total assist for doffing pants & brief and pt transfers to sitting EOB, then sit>R sidelying with mod assist with cuing for LUE placement & lowering with assistance to elevate RLE onto bed. RN dons new condom catheter & pt rolls L with max assist, R with min assist with bed flat & bed rails to allow therapist to don new brief. Therapist provides total assist for threading pants on BLE but pt is able to roll enough & participates in pulling pants over hips. Therapist provides total assist for donning shoes & pt transfers R sidelying>sitting EOB with mod assist with cuing for hand placement & overall technique.  Pt transfers to w/c with stedy lift & propels w/c room<>around nurses station & back with L hemi technique and min/mod assist & cuing to prevent pt from hitting obstacles on R side. Pt does have improved ability to use LLE to assist with task compared to previous date. At end of session pt left in w/c with chair alarm donned & call bell in  reach.  RN made aware & safety plan updated to reflect pt using stedy for transfers with nursing staff & attempting to get to toilet if pt has to have a BM (pt reports he is sometimes aware of need to use bathroom so encouraged him to ask to get to toilet when he does).  Therapy Documentation Precautions:  Precautions Precautions: Fall Precaution Comments: L crani Restrictions Weight Bearing Restrictions: No  Pain: Pt with c/o R hip pain - repositioning provided for comfort & pain relief.   Therapy/Group: Individual Therapy  [name] 11/10/2018, 12:40 PM

## 2018-11-10 NOTE — Progress Notes (Signed)
Speech Language Pathology Daily Session Note  Patient Details  Name: Gregory Moreno MRN: [id number] Date of Birth: December 04, 1995  Today's Date: 11/10/2018  Session 1: SLP Individual Time: 0730-0800 SLP Individual Time Calculation (min): 30 min   Session 2: SLP Individual Time: 1330-1400 SLP Individual Time Calculation (min): 30 min  Short Term Goals: Week 3: SLP Short Term Goal 1 (Week 3): Patient will vocalize on command in 25% of opportunities with Max multimodal cues. SLP Short Term Goal 2 (Week 3): Patient will utilize multimodal communication to express basic wants/needs with Max A verbal cues. SLP Short Term Goal 3 (Week 3): Patient will demonstrate sustained attention to functional tasks for 30 minutes with Mod verbal cues for redirection. SLP Short Term Goal 4 (Week 3): Patient will demonstrate basic problem solving with Mod verbal cues with functional and familair tasks. SLP Short Term Goal 5 (Week 3): Patient will be oriented to place and time with Mod A verbal cues. SLP Short Term Goal 6 (Week 3): Patient will consume current diet of Dys. 3 textures with thin liquids with minimal overt s/s of aspiration with Min verbal cues for use of compensatory strategies.  Skilled Therapeutic Interventions:   Session 1: Skilled treatment session focused on dysphagia and speech goals. SLP facilitated session by providing skilled observation with breakfast meal of Dys. 3 textures with thin liquids. Patient consumed meal without overt s/s of aspiration and supervision level verbal cues for use of swallowing compensatory strategies. Patient able to verbalize his basic wants/needs at the word and phrase level with decreased intelligibility and intermittent phonemic paraphasias noted. Patient left upright in bed with alarm on and all needs within reach. Continue with current plan of care.     Session 2: Skilled treatment session focused on speech goals. SLP facilitated session by providing extra time  and Max A verbal cues to decode at the phrase level and for word-finding during a phrase level completion task. However, patient named functional items with 90% accuracy and Mod-Max sentence completion and phonemic cues. Patient left upright in wheelchair with alarm on and all needs within reach. Continue with current plan of care.   Pain No/Denies Pain   Therapy/Group: Individual Therapy  [name] 11/10/2018, 12:25 PM

## 2018-11-11 ENCOUNTER — Inpatient Hospital Stay (HOSPITAL_COMMUNITY): Payer: 59 | Admitting: Speech Pathology

## 2018-11-11 ENCOUNTER — Inpatient Hospital Stay (HOSPITAL_COMMUNITY): Payer: 59

## 2018-11-11 ENCOUNTER — Inpatient Hospital Stay (HOSPITAL_COMMUNITY): Payer: 59 | Admitting: Occupational Therapy

## 2018-11-11 ENCOUNTER — Inpatient Hospital Stay (HOSPITAL_COMMUNITY): Payer: 59 | Admitting: Physical Therapy

## 2018-11-11 ENCOUNTER — Ambulatory Visit
Admit: 2018-11-11 | Discharge: 2018-11-11 | Disposition: A | Discharge status: Home / Self Care | Payer: 59 | Attending: Radiation Oncology | Admitting: Radiation Oncology

## 2018-11-11 ENCOUNTER — Other Ambulatory Visit: Payer: Self-pay | Admitting: Radiation Therapy

## 2018-11-11 DIAGNOSIS — C712 Malignant neoplasm of temporal lobe: Secondary | ICD-10-CM

## 2018-11-11 DIAGNOSIS — Z515 Encounter for palliative care: Secondary | ICD-10-CM

## 2018-11-11 MED ORDER — GADOBUTROL 1 MMOL/ML IV SOLN
7.0000 mL | Freq: Once | INTRAVENOUS | Status: AC | PRN
  Administered 2018-11-11: 7 mL via INTRAVENOUS

## 2018-11-11 NOTE — Progress Notes (Signed)
Physical Therapy Session Note  Patient Details  Name: Gregory Moreno MRN: [id number] Date of Birth: [birth year]  Today's Date: 11/11/2018 PT Individual Time: 1545-1630 PT Individual Time Calculation (min): 45 min   Short Term Goals: Week 3:  PT Short Term Goal 1 (Week 3): Pt will consistently complete all bed mobility tasks with max assist +1. PT Short Term Goal 2 (Week 3): Pt will consistently complete bed<>w/c transfers with max assist +1. PT Short Term Goal 3 (Week 3): Pt will propel w/c 100 ft with min assist.  Skilled Therapeutic Interventions/Progress Updates:    Pt standing within the stedy with nursing upon PT arrival, nursing helping to change pt's brief and catheter. Therapist assisted to loop LEs through pants and performed sit<>stand within stedy to pull pants over hips mod assist +2. Pt transported to the gym. Therapist donned ACE wrap to R LE for time management, pt performed sit<>stand at the rail with mod assist and then performed pre-gait stepping in place with each LE mod assist. Pt ambulated this session using L rail for L UE support x 10 ft, max assist +2 for w/c follow, therapist blocking R LE (quad activation noted during stance) and assisting with R LE advancement, hip flexor activation and initiation of swing noted. Pt performed squat pivot to the mat this session with mod assist, cues for techniques and sequencing. Pt performed x 4 sit<>stands this session from the mat without UE support and with mod-max assist from therapist, in standing therapist on pt's R side blocking knee and providing cues/facilitation for increased hip and trunk extension. In standing pt worked on dynamic standing balance and upright posture to reach for/toss horseshoes, x 2 trials with mod/max assist for standing balance and facilitation for hip extension. Pt sitting on mat performed L UE cross body reaching activity with therapist providing facilitation for R UE support/weightbearing. Pt performed  stand pivot to the L to w/c with max assist and transported back to room, left in w/c with needs in reach and father present.   Therapy Documentation Precautions:  Precautions Precautions: Fall Precaution Comments: L crani Restrictions Weight Bearing Restrictions: No    Therapy/Group: Individual Therapy  Netta Corrigan, PT, DPT, CSRS 11/11/2018, 8:09 AM

## 2018-11-11 NOTE — Consult Note (Signed)
Radiation Oncology         ([phone]) [phone] ________________________________  Name: Gregory Moreno        MRN: [id number]  Date of Service: 11/11/2018 DOB: 1995-09-11  [id number], [facility]   REFERRING PHYSICIAN: Dr. [name]  DIAGNOSIS: The encounter diagnosis was Glioblastoma multiforme of temporal lobe ([place]).   HISTORY OF PRESENT ILLNESS: Gregory Moreno is a 23 y.o. male seen at the request of Dr. [name] for a newly diagnosed Glioblastoma of the left temporal lobe. The patient apparently had been in his usual state of health with occasional headaches in the weeks prior to 10/13/2018 presentation to the ED with complaints of progressive right sided weakness beginning at 5 am 10/13/2018. He also had progressive worsening of his speech. He was solnolent but arousable at the time with concerns for acute decompensation. A CT head revealed a 4.4 cm high density hematoma with underlying mass measuring up to 6 cm and 4 mm midline shift. A CT angiogram of the head revealed no vascular explanation for the hematoma.  An MRI of the brain in the ED revealed a 4 cm left temporal lobe hemorrhage and associated infiltrative process that had mass like quality. Due to progressive decompensation, he proceeded with emergent surgical evacuation and resection of the left temporal mass. Postop MRI on 10/14/2018 revealed no residual enhancement, and decompression of the hematoma/mass with moderate regional edema and ischemia. Small subacute infarcts in the right globus pallidus and right midbrain were noted. There were also epidural blood products deep to the bone flap and midline shift of 7 mm. Final pathology revealed a high grade glioma. Further assessment at [facility] per notes indicated Glioblastoma (GBM).  He has had a prolonged recovery and has remained hospitalized. He was however accepted on 10/21/2018 to the rehabilitation floor of [facility] and has been under the care of Physical Medicine and  Rehabilitation since. Apparently he has had some progress even in the last two weeks with regaining some speech and motor function. He works aggressively with PT, OT, and speech pathology. We're asked to see him as there are plans for repeat MRI and proceeding with chemoRT to treat his GBM.    PREVIOUS RADIATION THERAPY: No   PAST MEDICAL HISTORY: No past medical history on file.     PAST SURGICAL HISTORY: Past Surgical History:  Procedure Laterality Date   CRANIOTOMY Left 10/13/2018   Procedure: LEFT CRANIOTOMY FOR TUMOR RESECTION;  Surgeon: Judith Part, MD;  Location: [facility];  Service: Neurosurgery;  Laterality: Left;     FAMILY HISTORY: No family history on file.   SOCIAL HISTORY:  reports that he has never smoked. He has never used smokeless tobacco. The patient is single. He is from [place]. His parents Gregory Moreno and Gregory Moreno are on the call and state that he was in his last semester at [employer]. 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 He enjoys travel and has a widely followed social media account related to aviation.  ALLERGIES: Patient has no known allergies.   MEDICATIONS:  No current facility-administered medications for this encounter.    No current outpatient medications on file.   Facility-Administered Medications Ordered in Other Encounters  Medication Dose Route Frequency Provider Last Rate Last Dose   acetaminophen (TYLENOL) tablet 650 mg  650 mg Oral Q4H PRN Cathlyn Parsons, PA-C   650 mg at 11/05/18 1317   Or   acetaminophen (TYLENOL) suppository  650 mg  650 mg Rectal Q4H PRN Angiulli, Lavon Paganini, PA-C       amantadine (SYMMETREL) capsule 100 mg  100 mg Oral BID WC Meredith Staggers, MD   100 mg at 11/10/18 1251   baclofen (LIORESAL) 10 mg/mL oral suspension 5 mg  5 mg Oral TID PRN Cathlyn Parsons, PA-C       baclofen (LIORESAL) tablet 5 mg  5 mg Oral TID Meredith Staggers, MD   5 mg at 11/10/18 2043   bisacodyl (DULCOLAX) suppository 10 mg  10 mg Rectal Daily PRN Cathlyn Parsons, PA-C   10 mg at 11/06/18 2201   dexamethasone (DECADRON) tablet 0.5 mg  0.5 mg Oral Q12H Meredith Staggers, MD   0.5 mg at 11/10/18 2042   escitalopram (LEXAPRO) tablet 5 mg  5 mg Oral QHS Meredith Staggers, MD   5 mg at 11/10/18 2042   heparin injection 5,000 Units  5,000 Units Subcutaneous Q8H Cathlyn Parsons, PA-C   5,000 Units at 11/10/18 2042   methylphenidate (RITALIN) tablet 10 mg  10 mg Oral BID WC Meredith Staggers, MD   10 mg at 11/10/18 1251   ondansetron (ZOFRAN) tablet 4 mg  4 mg Oral Q4H PRN Cathlyn Parsons, PA-C       Or   ondansetron [facility]) injection 4 mg  4 mg Intravenous Q4H PRN Angiulli, Lavon Paganini, PA-C       pantoprazole sodium (PROTONIX) 40 mg/20 mL oral suspension 40 mg  40 mg Per Tube Daily Meredith Staggers, MD   40 mg at 11/10/18 [id number]   senna-docusate (Senokot-S) tablet 1 tablet  1 tablet Oral BID Cathlyn Parsons, PA-C   1 tablet at 11/09/18 [id number]   sodium chloride (OCEAN) 0.65 % nasal spray 2 spray  2 spray Each Nare TID Meredith Staggers, MD   2 spray at 11/10/18 1420   sorbitol 70 % solution 30 mL  30 mL Oral Daily PRN Cathlyn Parsons, PA-C   30 mL at 11/07/18 [id number]     REVIEW OF SYSTEMS: On review of systems, the patient is unable to completely verbalize his current symptoms but his parents state he has been feeling pretty well with PT/OT and in the last week and a half, his speech has improved and he is able to now navigate his ipad to listen to music, but not yet able to text due to spelling errors and some trouble still with reading and missing connecting words. He is still unable to use his right upper extremity or stand on his right foot and balance. He can though on his left foot. No other complaints are verbalized by his parents.    PHYSICAL EXAM:  Wt Readings from Last 3 Encounters:  10/29/18 158 lb 15.2 oz (72.1 kg)   10/13/18 158 lb 4.6 oz (71.8 kg)   Temp Readings from Last 3 Encounters:  11/10/18 98 F (36.7 C) (Oral)  10/21/18 99 F (37.2 C) (Oral)   BP Readings from Last 3 Encounters:  11/10/18 109/65  10/21/18 124/76   Pulse Readings from Last 3 Encounters:  11/10/18 86  10/21/18 (!) 116   Unable to assess due to encounter type.    ECOG = 2 0 - Asymptomatic (Fully active, able to carry on all predisease activities without restriction)  1 - Symptomatic but completely ambulatory (Restricted in physically strenuous activity but ambulatory and able to carry out work of a light or sedentary nature.  For example, light housework, office work)  2 - Symptomatic, <50% in bed during the day (Ambulatory and capable of all self care but unable to carry out any work activities. Up and about more than 50% of waking hours)  3 - Symptomatic, >50% in bed, but not bedbound (Capable of only limited self-care, confined to bed or chair 50% or more of waking hours)  4 - Bedbound (Completely disabled. Cannot carry on any self-care. Totally confined to bed or chair)  5 - Death   Eustace Pen MM, Creech RH, Tormey DC, et al. [phone]). "Toxicity and response criteria of the [facility] Group". [place] Oncol. 5 (6): 649-55    LABORATORY DATA:  Lab Results  Component Value Date   WBC 8.0 11/10/2018   HGB 13.0 11/10/2018   HCT 39.9 11/10/2018   MCV 89.9 11/10/2018   PLT 214 11/10/2018   Lab Results  Component Value Date   NA 138 11/10/2018   K 3.9 11/10/2018   CL 100 11/10/2018   CO2 27 11/10/2018   Lab Results  Component Value Date   ALT 91 (H) 10/27/2018   AST 46 (H) 10/27/2018   ALKPHOS 56 10/27/2018   BILITOT 0.7 10/27/2018      RADIOGRAPHY: Ct Angio Head W Or Wo Contrast  Result Date: 10/13/2018 CLINICAL DATA:  Cerebral hemorrhage EXAM: CT ANGIOGRAPHY HEAD AND NECK TECHNIQUE: Multidetector CT imaging of the head and neck was performed using the standard protocol during  bolus administration of intravenous contrast. Multiplanar CT image reconstructions and MIPs were obtained to evaluate the vascular anatomy. Carotid stenosis measurements (when applicable) are obtained utilizing NASCET criteria, using the distal internal carotid diameter as the denominator. CONTRAST:  40mL OMNIPAQUE IOHEXOL 350 MG/ML SOLN COMPARISON:  Head CT earlier today FINDINGS: CTA NECK FINDINGS Aortic arch: Normal Right carotid system: Normal when accounting for mild motion. Left carotid system: Normal when accounting for mild motion Vertebral arteries: Normal Skeleton: Negative Other neck: Negative Upper chest: Clear apical lungs Review of the MIP images confirms the above findings CTA HEAD FINDINGS Anterior circulation: Vessels are smooth and widely patent. Hypoplastic right A1 segment. Negative for aneurysm or vascular malformation. No convincing dot sign or abnormal vessels within the hematoma. Proximal left MCA vessels are displaced superiorly Posterior circulation: Vessels are smooth and widely patent. No branch occlusion or aneurysm. Venous sinuses: Patent as permitted by contrast timing. Anatomic variants: As above Delayed phase: When densitometry of the periphery is compared to the noncontrast study there is no definite enhancement. Brain MRI with contrast is currently underway. Review of the MIP images confirms the above findings IMPRESSION: No vascular explanation for the left temporal hematoma. No aneurysm or visible vascular malformation. Major dural sinuses are patent. Electronically Signed   By: Monte Fantasia M.D.   On: 10/13/2018 07:17   Ct Head Wo Contrast  Addendum Date: 10/14/2018   ADDENDUM REPORT: 10/14/2018 04:00 ADDENDUM: Study discussed by telephone with RN Dutch Quint in the Neuro ICU on 10/14/2018 at 0355 hours. Electronically Signed   By: Genevie Ann M.D.   On: 10/14/2018 04:00   Result Date: 10/14/2018 CLINICAL DATA:  23 year old male postoperative day 1 left craniotomy for  resection of suspected hemorrhagic tumor. EXAM: CT HEAD WITHOUT CONTRAST TECHNIQUE: Contiguous axial images were obtained from the base of the skull through the vertex without intravenous contrast. COMPARISON:  Preoperative brain MRI, CT head and CTA head and neck. FINDINGS: Brain: Left temporal lobe resection with a combination of gas  and hemorrhage in and around the resection cavity, tracking toward the suprasellar cistern in the midline. Small volume intraventricular hemorrhage in the left temporal horn, atrium and occipital horn. No ventriculomegaly. There is also subdural appearing extra-axial hemorrhage underlying the craniotomy flap measuring 6-7 millimeters in thickness. See coronal image 29. Intracranial mass effect with 5-6 millimeters of rightward midline shift, minimally increased from the preoperative CT. Basilar cisterns remain patent. No superimposed acute cortically based infarct. Trace pneumocephalus along both anterior frontal convexities. Vascular: No suspicious intracranial vascular hyperdensity. Skull: Left frontotemporal craniotomy. Stable otherwise. Sinuses/Orbits: Left tympanic cavity and mastoids remain clear. Mild left sphenoid sinus mucosal thickening is stable. Other sinuses and mastoids are clear. Other: Postoperative changes to the left scalp with combined scalp hematoma and soft tissue gas. Orbits soft tissues remain negative. IMPRESSION: 1. Interval left frontotemporal craniotomy and left temporal lobe resection with a left side subdural hematoma mostly underlying the craniotomy flap, 6-7 mm in thickness. And a combination of gas and blood within the resection cavity. 2. Persistent intracranial mass effect with slightly increased rightward midline shift, now 5-6 mm. Basilar cisterns remain patent. 3. Trace left lateral intraventricular hemorrhage with no ventriculomegaly. Electronically Signed: By: Genevie Ann M.D. On: 10/14/2018 03:54   Ct Angio Neck W Or Wo Contrast  Result Date:  10/13/2018 CLINICAL DATA:  Cerebral hemorrhage EXAM: CT ANGIOGRAPHY HEAD AND NECK TECHNIQUE: Multidetector CT imaging of the head and neck was performed using the standard protocol during bolus administration of intravenous contrast. Multiplanar CT image reconstructions and MIPs were obtained to evaluate the vascular anatomy. Carotid stenosis measurements (when applicable) are obtained utilizing NASCET criteria, using the distal internal carotid diameter as the denominator. CONTRAST:  3mL OMNIPAQUE IOHEXOL 350 MG/ML SOLN COMPARISON:  Head CT earlier today FINDINGS: CTA NECK FINDINGS Aortic arch: Normal Right carotid system: Normal when accounting for mild motion. Left carotid system: Normal when accounting for mild motion Vertebral arteries: Normal Skeleton: Negative Other neck: Negative Upper chest: Clear apical lungs Review of the MIP images confirms the above findings CTA HEAD FINDINGS Anterior circulation: Vessels are smooth and widely patent. Hypoplastic right A1 segment. Negative for aneurysm or vascular malformation. No convincing dot sign or abnormal vessels within the hematoma. Proximal left MCA vessels are displaced superiorly Posterior circulation: Vessels are smooth and widely patent. No branch occlusion or aneurysm. Venous sinuses: Patent as permitted by contrast timing. Anatomic variants: As above Delayed phase: When densitometry of the periphery is compared to the noncontrast study there is no definite enhancement. Brain MRI with contrast is currently underway. Review of the MIP images confirms the above findings IMPRESSION: No vascular explanation for the left temporal hematoma. No aneurysm or visible vascular malformation. Major dural sinuses are patent. Electronically Signed   By: Monte Fantasia M.D.   On: 10/13/2018 07:17   Ct Chest W Contrast  Result Date: 10/13/2018 CLINICAL DATA:  Hemorrhagic left temporal lobe brain mass. Staging evaluation. EXAM: CT CHEST, ABDOMEN, AND PELVIS WITH  CONTRAST TECHNIQUE: Multidetector CT imaging of the chest, abdomen and pelvis was performed following the standard protocol during bolus administration of intravenous contrast. CONTRAST:  44mL OMNIPAQUE IOHEXOL 300 MG/ML  SOLN COMPARISON:  None. FINDINGS: CT CHEST FINDINGS Cardiovascular: Normal heart size. No significant pericardial effusion/thickening. Great vessels are normal in course and caliber. No central pulmonary emboli. Mediastinum/Nodes: No discrete thyroid nodules. Unremarkable esophagus. No pathologically enlarged axillary, mediastinal or hilar lymph nodes. Triangular soft tissue with stippled internal fat in the anterior mediastinum is compatible with  atrophic thymic tissue. Lungs/Pleura: No pneumothorax. No pleural effusion. No acute consolidative airspace disease, lung masses or significant pulmonary nodules. Musculoskeletal:  No aggressive appearing focal osseous lesions. CT ABDOMEN PELVIS FINDINGS Hepatobiliary: Normal liver with no liver mass. Normal gallbladder with no radiopaque cholelithiasis. No biliary ductal dilatation. Pancreas: Normal, with no mass or duct dilation. Spleen: Normal size. No mass. Adrenals/Urinary Tract: Normal adrenals. Normal kidneys with no hydronephrosis and no renal mass. Normal bladder. Stomach/Bowel: Normal non-distended stomach. Normal caliber small bowel with no small bowel wall thickening. Normal appendix. There is questionable segmental wall thickening in the collapsed ascending colon (series 3/image 84). Otherwise normal large bowel, with no diverticulosis or acute pericolonic fat stranding. Vascular/Lymphatic: Normal caliber abdominal aorta. Patent portal, splenic, hepatic and renal veins. No pathologically enlarged lymph nodes in the abdomen or pelvis. Reproductive: Normal size prostate. Other: No pneumoperitoneum, ascites or focal fluid collection. Musculoskeletal: No aggressive appearing focal osseous lesions. IMPRESSION: 1. Questionable nonspecific segmental  wall thickening in the collapsed ascending colon, poorly evaluated without oral contrast. This finding is most likely artifactual due to underdistention given the patient's age. A follow-up CT abdomen/pelvis with oral and IV contrast may be considered when clinically feasible. If the patient has risk factors for colonic neoplasm, colonoscopy correlation may be considered. 2. Otherwise, no lymphadenopathy or other findings suspicious for neoplastic disease in the chest, abdomen or pelvis. Electronically Signed   By: Ilona Sorrel M.D.   On: 10/13/2018 10:19   Mr Jeri Cos [id number] Contrast  Result Date: 10/14/2018 CLINICAL DATA:  Follow-up CNS neoplasm. EXAM: MRI HEAD WITHOUT AND WITH CONTRAST TECHNIQUE: Multiplanar, multiecho pulse sequences of the brain and surrounding structures were obtained without and with intravenous contrast. CONTRAST:  68mL GADAVIST GADOBUTROL 1 MMOL/ML IV SOLN COMPARISON:  MR from yesterday FINDINGS: Brain: Left temporal hematoma decompression with moderate regional ischemia and edema. There is epidural blood along the bone flap measuring 69mm thickness. There are small foci of acute ischemia in the right globus pallidus and midbrain. Small foci of acute infarction along the cortex of the medial and posterior left occipital lobe. No detectable residual enhancement when allowing for T1 hyperintense blood products. There are foci of enhancement within the collection around the bone flap of uncertain significance. Midline shift measures 7 mm. Vascular: Normal flow voids are preserved Skull and upper cervical spine: Left-sided craniotomy with fluid and gas superficial to the bone flap. Sinuses/Orbits: Negative IMPRESSION: 1. Left temporal hematoma/mass decompression with moderate regional edema and ischemia. No worrisome residual enhancement. Follow-up will be useful in differentiating edematous brain from infiltrating mass. 2. Small acute infarcts are seen in the right globus pallidus and right  midbrain. 3. Epidural blood products deep to the bone flap. Multifactorial midline shift which measures 7 mm. Electronically Signed   By: Monte Fantasia M.D.   On: 10/14/2018 10:30   Mr Jeri Cos [id number] Contrast  Result Date: 10/13/2018 CLINICAL DATA:  Cerebral hemorrhage. EXAM: MRI HEAD WITHOUT AND WITH CONTRAST TECHNIQUE: Multiplanar, multiecho pulse sequences of the brain and surrounding structures were obtained without and with intravenous contrast. CONTRAST:  54mL GADAVIST GADOBUTROL 1 MMOL/ML IV SOLN COMPARISON:  None. FINDINGS: Brain: Known hematoma in the left temporal lobe with heterogeneous appearance from pockets of blood and soft tissue. The hemorrhagic area measures up to 4 cm in diameter, stable from prior. There is a rim of T2 hyper to isointense soft tissue which show signs of dense cellularity on diffusion imaging. Wispy enhancement is seen at intermittently along the  posterior margin of the abnormality and at the anterior aspect of the hematoma. Even when accounting for compression by local mass effect the cortex of the temporal lobe appears blurred and thickened, especially inferiorly. This infiltrative appearance favors a glioma. No second lesion is seen and there is no history of malignancy. Although there is enhancement is not a typical pattern of necrotic rim enhancement around the hemorrhagic area, and this could reflect a low-grade glioma. Would also expect more enhancement for a solitary metastasis. PXA and ganglioglioma are considered given the mild enhancement, location, and age. There is no cyst with nodule for these entities but this morphology could be obscured by the acute hemorrhage. PXA is also considered less likely given the absence of dural thickening. Oligodendroglioma can have this type of non necrotic enhancement pattern. No reported history of chronic seizure and no parenchymal calcifications by CT. No visible cortical vein thrombus. Midline shift measures up to 7 mm. No  herniation, infarct, or hydrocephalus. Vascular: Major flow voids and vascular enhancements are preserved, including the dural venous sinuses. 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 Given age and the mild enhancement pattern question an enhancing low-grade glioma, as above. Biopsy targeting may be more accurate after the hematoma has diminished. Electronically Signed   By: Monte Fantasia M.D.   On: 10/13/2018 08:31   Ct Abdomen Pelvis W Contrast  Result Date: 10/13/2018 CLINICAL DATA:  Hemorrhagic left temporal lobe brain mass. Staging evaluation. EXAM: CT CHEST, ABDOMEN, AND PELVIS WITH CONTRAST TECHNIQUE: Multidetector CT imaging of the chest, abdomen and pelvis was performed following the standard protocol during bolus administration of intravenous contrast. CONTRAST:  39mL OMNIPAQUE IOHEXOL 300 MG/ML  SOLN COMPARISON:  None. FINDINGS: CT CHEST FINDINGS Cardiovascular: Normal heart size. No significant pericardial effusion/thickening. Great vessels are normal in course and caliber. No central pulmonary emboli. Mediastinum/Nodes: No discrete thyroid nodules. Unremarkable esophagus. No pathologically enlarged axillary, mediastinal or hilar lymph nodes. Triangular soft tissue with stippled internal fat in the anterior mediastinum is compatible with atrophic thymic tissue. Lungs/Pleura: No pneumothorax. No pleural effusion. No acute consolidative airspace disease, lung masses or significant pulmonary nodules. Musculoskeletal:  No aggressive appearing focal osseous lesions. CT ABDOMEN PELVIS FINDINGS Hepatobiliary: Normal liver with no liver mass. Normal gallbladder with no radiopaque cholelithiasis. No biliary ductal dilatation. Pancreas: Normal, with no mass or duct dilation. Spleen: Normal size. No mass. Adrenals/Urinary Tract: Normal adrenals. Normal  kidneys with no hydronephrosis and no renal mass. Normal bladder. Stomach/Bowel: Normal non-distended stomach. Normal caliber small bowel with no small bowel wall thickening. Normal appendix. There is questionable segmental wall thickening in the collapsed ascending colon (series 3/image 84). Otherwise normal large bowel, with no diverticulosis or acute pericolonic fat stranding. Vascular/Lymphatic: Normal caliber abdominal aorta. Patent portal, splenic, hepatic and renal veins. No pathologically enlarged lymph nodes in the abdomen or pelvis. Reproductive: Normal size prostate. Other: No pneumoperitoneum, ascites or focal fluid collection. Musculoskeletal: No aggressive appearing focal osseous lesions. IMPRESSION: 1. Questionable nonspecific segmental wall thickening in the collapsed ascending colon, poorly evaluated without oral contrast. This finding is most likely artifactual due to underdistention given the patient's age. A follow-up CT abdomen/pelvis with oral and IV contrast may be considered when clinically feasible. If the patient has risk factors for colonic neoplasm, colonoscopy correlation may be considered. 2. Otherwise, no lymphadenopathy or other findings suspicious for neoplastic disease in the chest, abdomen or pelvis.  Electronically Signed   By: Ilona Sorrel M.D.   On: 10/13/2018 10:19   Dg Chest Port 1 View  Result Date: 10/15/2018 CLINICAL DATA:  Status post craniotomy EXAM: PORTABLE CHEST 1 VIEW COMPARISON:  10/13/2018 FINDINGS: The heart size and mediastinal contours are within normal limits. Both lungs are clear. The visualized skeletal structures are unremarkable. IMPRESSION: No acute abnormality of the lungs in AP portable projection. Electronically Signed   By: Eddie Candle M.D.   On: 10/15/2018 08:15   Dg Chest Port 1 View  Result Date: 10/13/2018 CLINICAL DATA:  Intubation. EXAM: PORTABLE CHEST 1 VIEW COMPARISON:  None. FINDINGS: An ETT terminates 6.3 cm above the carina and 6.7 cm  below the thoracic inlet, in good position. The lungs are clear. The cardiomediastinal silhouette is unremarkable. No pneumothorax. IMPRESSION: The ETT is in good position.  No other abnormalities. Electronically Signed   By: Dorise Bullion III M.D   On: 10/13/2018 20:07   Ct Head Code Stroke Wo Contrast  Result Date: 10/13/2018 CLINICAL DATA:  Code stroke. Sudden onset left-sided headache with right facial droop and right-sided weakness EXAM: CT HEAD WITHOUT CONTRAST TECHNIQUE: Contiguous axial images were obtained from the base of the skull through the vertex without intravenous contrast. COMPARISON:  None. FINDINGS: Brain: Patchy high-density hematoma within the left temporal lobe with upward mass effect on the basal ganglia. High-density hematoma portion measures up to 4.4 cm (this measurement includes areas of non high-density parenchyma or un clotted blood). There is a rim of high density which encircles the hematoma and may reflect an underlying mass or subacute hematoma. The entire abnormality measures up to 6 cm in diameter. Local mass effect with midline shift measuring up to 4 mm. No entrapment. Vascular: No hyperdense vessel or unexpected calcification. Skull: Normal. Negative for fracture or focal lesion. Sinuses/Orbits: No acute finding. Other: Critical Value/emergent results were called by telephone at the time of interpretation on 10/13/2018 at 6:49 am to providerArora, who verbally acknowledged these results. ASPECTS [facility] Stroke Program Early CT Score) Not scored in this setting IMPRESSION: 1. Acute hematoma in the left temporal lobe which appears encapsulated by a high-density rim, suspected underlying mass or subacute hemorrhage. CTA and brain MRI are pending. 2. 4 mm of midline shift. Electronically Signed   By: Monte Fantasia M.D.   On: 10/13/2018 06:52   Vas [place] Lower Extremity Venous (dvt)  Result Date: 10/23/2018  Lower Venous Study Indications: Swelling.  Comparison Study: No  prior study. Performing Technologist: Maudry Mayhew MHA, RDMS, RVT, RDCS  Examination Guidelines: A complete evaluation includes B-mode imaging, spectral Doppler, color Doppler, and power Doppler as needed of all accessible portions of each vessel. Bilateral testing is considered an integral part of a complete examination. Limited examinations for reoccurring indications may be performed as noted.  +---------+---------------+---------+-----------+----------+--------------+  RIGHT     Compressibility Phasicity Spontaneity Properties Thrombus Aging  +---------+---------------+---------+-----------+----------+--------------+  CFV       Full            Yes       Yes                                    +---------+---------------+---------+-----------+----------+--------------+  SFJ       Full                                                             +---------+---------------+---------+-----------+----------+--------------+  FV Prox   Full                                                             +---------+---------------+---------+-----------+----------+--------------+  FV Mid    Full                                                             +---------+---------------+---------+-----------+----------+--------------+  FV Distal Full                                                             +---------+---------------+---------+-----------+----------+--------------+  PFV       Full                                                             +---------+---------------+---------+-----------+----------+--------------+  POP       Full            Yes       Yes                                    +---------+---------------+---------+-----------+----------+--------------+  PTV       Full                                                             +---------+---------------+---------+-----------+----------+--------------+  PERO      Full                                                              +---------+---------------+---------+-----------+----------+--------------+   +---------+---------------+---------+-----------+----------+--------------+  LEFT      Compressibility Phasicity Spontaneity Properties Thrombus Aging  +---------+---------------+---------+-----------+----------+--------------+  CFV       Full            Yes       Yes                                    +---------+---------------+---------+-----------+----------+--------------+  SFJ       Full                                                             +---------+---------------+---------+-----------+----------+--------------+  FV Prox   Full                                                             +---------+---------------+---------+-----------+----------+--------------+  FV Mid    Full                                                             +---------+---------------+---------+-----------+----------+--------------+  FV Distal Full                                                             +---------+---------------+---------+-----------+----------+--------------+  PFV       Full                                                             +---------+---------------+---------+-----------+----------+--------------+  POP       Full            Yes       Yes                                    +---------+---------------+---------+-----------+----------+--------------+  PTV       Full                                                             +---------+---------------+---------+-----------+----------+--------------+  PERO      Full                                                             +---------+---------------+---------+-----------+----------+--------------+  Summary: Right: There is no evidence of deep vein thrombosis in the lower extremity. No cystic structure found in the popliteal fossa. Left: There is no evidence of deep vein thrombosis in the lower extremity. No cystic structure found in the popliteal fossa.  *See table(s)  above for measurements and observations. Electronically signed by Monica Martinez MD on 10/23/2018 at 5:04:15 PM.    Final        IMPRESSION/PLAN: 1. Glioblastoma of the left temporal lobe. The patient's case has been reviewed in conference and he is a good candidate to proceed with aggressive treatment given his young age and health status prior to his presentation. Dr. Lisbeth Renshaw agrees with repeat MRI in the next few days. We will follow up  on these results and review them in conference. We will discuss timing of initiating radiotherapy along with Temodar. I spoke with the patient's parents and patient by phone today and he was on speaker phone. He feels as though he has a good understanding of the plan for chemoRT.  We discussed the options of this course over 6 weeks time. Dr. [name] currently anticipates beginning therapy on 11/24/2018. We will plan for simulation on Thursday of this week with the help of carelink.  2.  Speech deficits and word finding difficulties. The patient continues to work with Speech Pathology and their notes indicate he is making progress, significantly in fact compared to about two weeks ago. I will follow up with Dr. [name] as to the time of initiating treatment for #1 given that we do not wish to halt any of his neurologic progress.  3. Dysphagia. The patient also continues to work with Speech pathology and is currently tolerating liquid diet. We will follow this expectantly.   In a visit lasting 70 minutes, greater than 50% of the time was spent by phone and in floor time, coordinating the patient's care.    Carola Rhine, PAC

## 2018-11-11 NOTE — Progress Notes (Signed)
Patient ID: Gregory Moreno, male   DOB: 04/06/1995, 24 y.o.   MRN: [id number]  This NP visited patient at the bedside to introduce myself and the role of palliative medicine in a holistic treatment plan.   Patient is OOB to wheel chair, makes eye contact and able to verbalize a few words Mother / [name] at bedside.  Patient's mother freely offered a brief summary of her sons unfortunate situation and feelings of disbelief and overwhelm in the diagnosis.  Emotional support offered.  Patient and family are well supported here at inpatient rehab.  Lucy/licensed clinical Education officer, museum and I spoke discussed his case and how palliative might augment his care.  I encouraged staff and family/patient to contact palliative medicine with any question concern or need now on into the future.  This nurse practitioner well intermittently visit with patient and family at the bedside to offer support and assistance in navigating this complex medical situation.  Discussed with mother  the importance of continued conversation with her family and the  medical providers regarding overall plan of care and treatment options,  ensuring decisions are within the context of the patients values and GOCs.  Patient and family are open to all offered and available medical interventions to prolong life at this time.  Questions and concerns addressed   Discussed with Dr Naaman Plummer  No charge  Wadie Lessen NP  Palliative Medicine Team Team Phone # [phone] Pager [phone]

## 2018-11-11 NOTE — Progress Notes (Signed)
Physical Therapy Session Note  Patient Details  Name: NYQUAN PURSIFULL MRN: [id number] Date of Birth: 04/14/1995  Today's Date: 11/11/2018 PT Individual Time: 1335-1345 PT Individual Time Calculation (min): 10 min   Short Term Goals: Week 3:  PT Short Term Goal 1 (Week 3): Pt will consistently complete all bed mobility tasks with max assist +1. PT Short Term Goal 2 (Week 3): Pt will consistently complete bed<>w/c transfers with max assist +1. PT Short Term Goal 3 (Week 3): Pt will propel w/c 100 ft with min assist.  Skilled Therapeutic Interventions/Progress Updates:   Pt missed 35 min of skilled PT, off unit for MRI. Pt received in supine and agreeable to therapy, no c/o or evidence of pain. Donned pants in supine, total assist to thread, mod assist to bring over hips in supine bridge. Supine>sit w/ min assist and max assist stand pivot to w/c w/ verbal and tactile cues for technique, UE placement, and sequencing. Ended session handoff to SLP.   Therapy Documentation Precautions:  Precautions Precautions: Fall Precaution Comments: L crani Restrictions Weight Bearing Restrictions: No  Therapy/Group: Individual Therapy  Shaquira Moroz K Coralie Stanke 11/11/2018, 2:18 PM

## 2018-11-11 NOTE — Progress Notes (Signed)
Occupational Therapy Session Note  Patient Details  Name: TAIWAN MADDOCKS MRN: [id number] Date of Birth: 02-15-1995  Today's Date: 11/11/2018 OT Individual Time: 1000-1100 OT Individual Time Calculation (min): 60 min    Short Term Goals: Week 3:  OT Short Term Goal 1 (Week 3): Pt will thread 1LE into pants with A for sitting balance only OT Short Term Goal 2 (Week 3): Pt will sit to stand wiht MOD A +2 consistently in prep for clothing management OT Short Term Goal 3 (Week 3): Pt will transfer with MOD A of 2 in stedy to decrease caregiver burden OT Short Term Goal 4 (Week 3): Pt will don shirt wiht MIN A OT Short Term Goal 5 (Week 3): Pt will sit EOM dynamically reaching outside BOS during functional activity with MOD A for LOB correction  Skilled Therapeutic Interventions/Progress Updates:    Upon entering the room, pt supine in bed with father present in the room. Pt agreeable to OT intervention and with no c/o pain this session. Supine >sit with mod A to EOB. When pt given choice of 2 for clothing he was able to make decision on items he would like to wear. Pt requesting shower and stedy utilized for transfer onto 3 in 1 commode chair placed in shower. Pt standing with min A from stedy. Pt maintained close supervision for sitting balance during bathing task. OT assisted with washing buttocks , B LEs, and L UE. Pt required increased time and min cuing for sequencing tasks. Pt standing from 3 in 1 with stedy and maintaining balance with min A while therapist pulling pants on and donning brief. Pt seated in wheelchair at sink from stedy. Pt required mod A for UB dressing with hemiplegic techniques. Set up A to brush teeth but pt did spit toothpaste from mouth this session with verbal commands! Figure four position to don B socks and shoes. Pt needing more assistance with socks secondary to fit but able to don B shoes with elastic laces this session. Pt remained in wheelchair with chair alarm belt  donned and call bell within reach. [name] father remains present in room.   Therapy Documentation Precautions:  Precautions Precautions: Fall Precaution Comments: L crani Restrictions Weight Bearing Restrictions: No General:   Vital Signs:   Pain: Pain Assessment Pain Scale: 0-10 Pain Score: 0-No pain ADL: ADL Grooming: Maximal assistance Where Assessed-Grooming: Sitting at sink, Wheelchair Upper Body Bathing: Maximal assistance Where Assessed-Upper Body Bathing: Sitting at sink, Wheelchair Lower Body Bathing: Maximal assistance Where Assessed-Lower Body Bathing: Sitting at sink, Wheelchair Upper Body Dressing: Maximal assistance Where Assessed-Upper Body Dressing: Sitting at sink, Wheelchair Lower Body Dressing: Maximal assistance Where Assessed-Lower Body Dressing: Sitting at sink, Wheelchair Toileting: Unable to assess Toilet Transfer: Dependent(+2) Armed forces technical officer Method: Charlaine Dalton) Vision   Perception    Praxis   Exercises:   Other Treatments:     Therapy/Group: Individual Therapy  [name] 11/11/2018, 12:37 PM

## 2018-11-11 NOTE — Patient Care Conference (Signed)
Inpatient RehabilitationTeam Conference and Plan of Care Update Date: 11/12/2018   Time: 10:05 AM    Patient Name: Gregory Moreno      Medical Record Number: [id number]  Date of Birth: [birth year] Sex: Male         Room/Bed: 4W01C/4W01C-01 Payor Info: Payor: Theme park manager / Plan: Theme park manager OTHER / Product Type: *No Product type* /    Admit Date/Time:  10/21/2018  4:11 PM  Primary Diagnosis:  Intracranial tumor ([place])  Patient Active Problem List   Diagnosis Date Noted  . Palliative care by specialist   . Glioblastoma multiforme of temporal lobe ([place]) 11/11/2018  . Spastic hemiparesis ([place])   . Cerebral edema ([facility])   . Intracranial tumor ([place])   . Steroid-induced hyperglycemia   . Spastic hemiplegia affecting nondominant side ([place])   . Hyponatremia   . Transaminitis   . Leucocytosis   . Right spastic hemiplegia ([place]) 10/21/2018  . Dysphagia 10/21/2018  . 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 Brain mass   . ICH (intracerebral hemorrhage) ([place]) 10/13/2018    Expected Discharge Date: Expected Discharge Date: 11/28/18  Team Members Present: Physician leading conference: Dr. Alger Simons Social Worker Present: Lennart Pall, LCSW Nurse Present: Romilda Garret PT Present: Canary Brim, PT OT Present: Willeen Cass, OT SLP Present: Weston Anna, SLP PPS Coordinator present : Gunnar Fusi, Novella Olive, PT Case Manager:  Karene Fry, RN     Current Status/Progress Goal Weekly Team Focus  Bowel/Bladder   Incontinent of B/B LBM 10/26  regain function/continence  timed toileting laxatives prn   Swallow/Nutrition/ Hydration   Dys. 3 textures wiht thin liquids, supervision-Min A  Supervision  trials of regular textures, increased use of swallowing strategies   ADL's   significantly improved initation and communication, CGA SBT, MOD-MAX A sit to stand mod-max A ADLs  mod A overall for ADLs and transfers  RUE NMR, familiy education, ADL training, sit to  stand, standing balance, fuctional transfers   Mobility   +2 for all mobility for safety, pt able to participate in slide board transfers, mod/max assist w/c mobility, working on standing/gait for R NMR  mod/max assist overall w/c level  sitting balance, trunk control, midline orientation, awareness, w/c mobility, transfers, bed mobility, R NMR   Communication   Mod-Max A  Max A  increase vocal intensity/intelligibility, communication at the phrase level   Safety/Cognition/ Behavioral Observations  Mod A  Min A  problem solving, recall with use of external aids   Pain   denies pain  remain pain free  assess pain qshift and prn   Skin   healed incision left side of head  free of infection skin remain intact  assess skin qshift and prn    Rehab Goals Patient on target to meet rehab goals: Yes *See Care Plan and progress notes for long and short-term goals.     Barriers to Discharge  Current Status/Progress Possible Resolutions Date Resolved   Nursing                  PT                    OT                  SLP                SW                Discharge Planning/Teaching  Needs:  Home with parents to provide 24/7 assistance.  Parents here daily;  formal teaching to be completed closer to d/c.   Team Discussion: Progressing with language, eating well, making clinical progress.  No pain, incontinent of B/B, wearing condom cath.  OT max A sit to stand, max A with stand pivot, mod A when standing, UB ADL min/mod A, LB ADL mod to max A.  Goals are min A.  PT transfers and w/c mobility mod/max A, working on walking, standing max A +2.  SLP talking, aphasic, working on error awareness, D3thins, trials of regular, thins soon.  Question from therapies do we need family conference, for better preparedness?  Could we do this during therapies?  Both parents sometimes not present at the same time. ? Possible family conference next week?  Revisions to Treatment Plan: N/A     Medical  Summary Current Status: improved language and communication. po intake better. more alert. neuro-onc following. tone improved Weekly Focus/Goal: mri this week, onc plan pending results.  Barriers to Discharge: Medical stability;Other (comments)  Barriers to Discharge Comments: ?cancer progression     Continued Need for Acute Rehabilitation Level of Care: The patient requires daily medical management by a physician with specialized training in physical medicine and rehabilitation for the following reasons: Direction of a multidisciplinary physical rehabilitation program to maximize functional independence : Yes Medical management of patient stability for increased activity during participation in an intensive rehabilitation regime.: Yes Analysis of laboratory values and/or radiology reports with any subsequent need for medication adjustment and/or medical intervention. : Yes   I attest that I was present, lead the team conference, and concur with the assessment and plan of the team. Team conference was held via web/ teleconference due to [place], [place] 11/12/2018, 12:00 PM

## 2018-11-11 NOTE — Progress Notes (Signed)
[place] PHYSICAL MEDICINE & REHABILITATION PROGRESS NOTE  Subjective/Complaints:  In good spirits. Eating breakfast.   ROS: Limited due to cognitive/language    Objective: Vital Signs: Blood pressure 110/64, pulse 70, temperature 98.9 F (37.2 C), temperature source Oral, resp. rate 16, height 5\' 7"  (1.702 m), weight 72.1 kg, SpO2 99 %. No results found. Recent Labs    11/10/18 0819  WBC 8.0  HGB 13.0  HCT 39.9  PLT 214   Recent Labs    11/10/18 0819  NA 138  K 3.9  CL 100  CO2 27  GLUCOSE 100*  BUN 15  CREATININE 0.84  CALCIUM 9.8    Physical Exam: BP 110/64 (BP Location: Left Arm)   Pulse 70   Temp 98.9 F (37.2 C) (Oral)   Resp 16   Ht 5\' 7"  (1.702 m)   Wt 72.1 kg   SpO2 99%   BMI 24.90 kg/m  Constitutional: No distress . Vital signs reviewed. HEENT: EOMI, oral membranes moist Neck: supple Cardiovascular: RRR without murmur. No JVD    Respiratory: CTA Bilaterally without wheezes or rales. Normal effort    GI: BS +, non-tender, non-distended   Skin: Warm and dry.  Intact. Psych: alert and smiling Musc: No edema in extremities.  No tenderness in extremities. Neuro: Alert, engages more. Speaking in phrases and short sentences. Responses appropriate to questions asked.  Motor:  RUE and RLE 0/5, trace resting tone---no increase in tone today  Assessment/Plan: 1. 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  Physiatrist is providing close team supervision and 24 hour management of active medical problems listed below.  Physiatrist and rehab team continue to assess barriers to discharge/monitor patient progress toward functional and medical goals  Care Tool:  Bathing    Body parts bathed by patient: Right arm, Chest, Abdomen, Left upper leg, Right upper leg, Face, Front perineal area   Body parts bathed by helper:  Left arm, Buttocks, Right lower leg, Left lower leg     Bathing assist Assist Level: Moderate Assistance - Patient 50 - 74%     Upper Body Dressing/Undressing Upper body dressing   What is the patient wearing?: Pull over shirt    Upper body assist Assist Level: Minimal Assistance - Patient > 75%    Lower Body Dressing/Undressing Lower body dressing      What is the patient wearing?: Pants     Lower body assist Assist for lower body dressing: Moderate Assistance - Patient 50 - 74%     Toileting Toileting    Toileting assist Assist for toileting: Total Assistance - Patient < 25%     Transfers Chair/bed transfer  Transfers assist  Chair/bed transfer activity did not occur: Safety/medical concerns  Chair/bed transfer assist level: Maximal Assistance - Patient 25 - 49%     Locomotion Ambulation   Ambulation assist   Ambulation activity did not occur: Safety/medical concerns  Assist level: 2 helpers Assistive device: Other (comment)(rail in hallway) Max distance: 10'   Walk 10 feet activity   Assist  Walk 10 feet activity did not occur: Safety/medical concerns  Assist level: 2 helpers Assistive device: Other (comment)(rail in hallway)   Walk 50 feet activity   Assist Walk 50 feet with 2 turns activity did not occur: Safety/medical concerns         Walk 150 feet activity   Assist Walk 150 feet  activity did not occur: Safety/medical concerns         Walk 10 feet on uneven surface  activity   Assist Walk 10 feet on uneven surfaces activity did not occur: Safety/medical concerns         Wheelchair     Assist Will patient use wheelchair at discharge?: Yes Type of Wheelchair: Manual Wheelchair activity did not occur: Safety/medical concerns  Wheelchair assist level: Moderate Assistance - Patient 50 - 74%, Minimal Assistance - Patient > 75% Max wheelchair distance: >58'    Wheelchair 50 feet with 2 turns  activity    Assist    Wheelchair 50 feet with 2 turns activity did not occur: Safety/medical concerns   Assist Level: Moderate Assistance - Patient 50 - 74%, Minimal Assistance - Patient > 75%   Wheelchair 150 feet activity     Assist Wheelchair 150 feet activity did not occur: Safety/medical concerns   Assist Level: Minimal Assistance - Patient > 75%, Moderate Assistance - Patient 50 - 74%      Medical Problem List and Plan: 1.Right greater than left-sided weaknesssecondary to intracerebral hemorrhage with GBM.Surgery was done emergently due to signs of herniation due to notes.Status post left crani for hematoma evacuation and tumor resection 10/13/2018.   Continue CIR PT, OT, SLP--making gains in communication/initiation  PRAFO, WHO at night  Neuro oncology with extensive and productive discussion with patient and family yesterday regarding diagnosis and prognosis, appreciate assistance   -will order MRI this week   -XRT and CTX potentially 4-6 weeks post-op or once max functional gains reached/dc from rehab   -spoke to palliative care who's also involved with case per Dr. [name] 2. Antithrombotics: -DVT/anticoagulation:Subcutaneous heparin initiated 10/16/2018 -antiplatelet therapy: N/A 3. Pain Management:Tylenol as needed 4. Mood:Provide emotional support -antipsychotic agents: N/A 5. Neuropsych: This patientis not of making decisions on hisown behalf. 6. Skin/Wound Care:Routine skin checks 7. Fluids/Electrolytes/Nutrition:intake 100% 8. Dysphagia. Dysphasia #2 thin liquids. 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 Constipation  Bowel meds as necessary 11. Spasticity  Baclofen 5mg  2 times daily started on 10/9---increased to TID 10/13  -tone controlled---no  changes to regimen 12. Hiccups  Baclofen as needed  Improved 13.  Steroid-induced hyperglycemia  Continue to monitor with steroid wean 14.  Hyponatremia  Sodium 138 10/19  10/26- Na 138 15.  Transaminitis  LFTs   stable-slightly improved on 10/12 16.  Leukocytosis-  steroid-induced  WBCs down to 6.7 10/19  -10/26- WBC 8k   LOS: 21 days A FACE TO FACE EVALUATION WAS PERFORMED  Meredith Staggers 11/11/2018, 9:21 AM

## 2018-11-11 NOTE — Progress Notes (Signed)
Speech Language Pathology Daily Session Note  Patient Details  Name: Gregory Moreno MRN: [id number] Date of Birth: 07-19-1995  Today's Date: 11/11/2018  Session 1: SLP Individual Time: 0930-1000 SLP Individual Time Calculation (min): 30 min   Session 2: SLP Individual Time: [id number] SLP Individual Time Calculation (min): 30 min  Short Term Goals: Week 3: SLP Short Term Goal 1 (Week 3): Patient will vocalize on command in 25% of opportunities with Max multimodal cues. SLP Short Term Goal 2 (Week 3): Patient will utilize multimodal communication to express basic wants/needs with Max A verbal cues. SLP Short Term Goal 3 (Week 3): Patient will demonstrate sustained attention to functional tasks for 30 minutes with Mod verbal cues for redirection. SLP Short Term Goal 4 (Week 3): Patient will demonstrate basic problem solving with Mod verbal cues with functional and familair tasks. SLP Short Term Goal 5 (Week 3): Patient will be oriented to place and time with Mod A verbal cues. SLP Short Term Goal 6 (Week 3): Patient will consume current diet of Dys. 3 textures with thin liquids with minimal overt s/s of aspiration with Min verbal cues for use of compensatory strategies.  Skilled Therapeutic Interventions:  Session 1: Skilled treatment session focused on language goals and family education with the patient's father. SLP facilitated session by providing Max A verbal cues for emergent awareness of verbal errors while reading e-mails aloud (which he verbalized he can independently do). 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 Educated patient in regards to etiology of impairments and goals of skilled SLP intervention, he verbalized understanding. Patient also attempted to write his name with perseveration noted during self-formulation but ability to copy independently. Patient's father present and educated in regards to patient's current language impairments and  strategies to utilize to maximize verbal expression and awareness of errors. All questions were answered. Patient left upright in bed with alarm on and father present. Continue with current plan of care.   Session 2: Skilled treatment session focused on dysphagia and speech goals. SLP facilitated session by providing intermittent verbal cues for patient to utilize small bites with lunch meal of Dys. 3 textures with thin liquids. Patient consumed meal without overt s/s of aspiration, therefore recommend patient continue current diet and initiate trials of regular textures within the next few sessions.  SLP also facilitated session by providing extra time for patient to express basic wants/needs as well as spontaneous verbalizations. Patient often started a thought but then would finish with, "I forgot." Min verbal cues were also needed to maximize vocal intensity and overall intelligibility. Patient left upright in wheelchair with father present. Continue with current plan of care.   Pain Pain Assessment Pain Scale: 0-10 Pain Score: 0-No pain  Therapy/Group: Individual Therapy  Gregory Moreno 11/11/2018, 10:05 AM

## 2018-11-12 ENCOUNTER — Inpatient Hospital Stay (HOSPITAL_COMMUNITY): Payer: 59 | Admitting: Speech Pathology

## 2018-11-12 ENCOUNTER — Inpatient Hospital Stay (HOSPITAL_COMMUNITY): Payer: 59 | Admitting: Physical Therapy

## 2018-11-12 ENCOUNTER — Inpatient Hospital Stay (HOSPITAL_COMMUNITY): Payer: 59

## 2018-11-12 DIAGNOSIS — Z515 Encounter for palliative care: Secondary | ICD-10-CM

## 2018-11-12 MED ORDER — DEXAMETHASONE 4 MG PO TABS
4.0000 mg | ORAL_TABLET | Freq: Two times a day (BID) | ORAL | Status: DC
  Administered 2018-11-13 – 2018-11-27 (×30): 4 mg via ORAL
  Filled 2018-11-12 (×31): qty 1

## 2018-11-12 NOTE — Progress Notes (Signed)
I spoke with the patient's parents and let them know about the discussion of Gregory Moreno's MRI that was performed yesterday and the recommendation to restart a higher dose of dexamethasone to treat what is felt to be inflammatory enhancement of the surgical cavity. It was not felt to be malignant transformation, and we would postpone radiotherapy planning and treatment. The next step would be for repeat MRI on 11/9 or 11/10, followed by simulation on 11/26/2018, and then to begin treatment on 12/01/2018.      Carola Rhine, PAC

## 2018-11-12 NOTE — Progress Notes (Signed)
Occupational Therapy Weekly Progress Note  Patient Details  Name: Gregory Moreno MRN: [id number] Date of Birth: [birth year]  Beginning of progress report period: November 05, 2018 End of progress report period: November 12, 2018  Today's Date: 11/12/2018 OT Individual Time: 0800-0900 OT Individual Time Calculation (min): 60 min    Patient has met 5 of 5 short term goals.  Pt continues to be agreeable to working with OT and progressing towards goals. Pt demonstrates improvement in sitting balance, participation in ADL tasks, functional transfers and UB/LB dressing. Pt is max A for sit <> stand and mod A for SBT. Pt is mod A for sitting balance. Pt is mod A for LB dressing and min-mod A for UB dressing.  Pt will continue to benefit from skilled OT to address dynamic sitting balance, functional reaching, functional transfers onto multiple surfaces and sit <> stand transfers to increase participation in more ADL tasks.  Patient continues to demonstrate the following deficits: muscle weakness, muscle joint tightness and muscle paralysis, impaired timing and sequencing, abnormal tone, unbalanced muscle activation, decreased coordination and decreased motor planning, decreased midline orientation, decreased attention to right, right side neglect and decreased motor planning and decreased sitting balance, decreased standing balance, decreased postural control, hemiplegia, decreased balance strategies and difficulty maintaining precautions and therefore will continue to benefit from skilled OT intervention to enhance overall performance with BADL and iADL.  Patient progressing toward long term goals..  Continue plan of care.  OT Short Term Goals Week 3:  OT Short Term Goal 1 (Week 3): Pt will thread 1LE into pants with A for sitting balance only OT Short Term Goal 1 - Progress (Week 3): Met OT Short Term Goal 2 (Week 3): Pt will sit to stand wiht MOD A +2 consistently in prep for clothing management OT  Short Term Goal 2 - Progress (Week 3): Met OT Short Term Goal 3 (Week 3): Pt will transfer with MOD A of 2 in stedy to decrease caregiver burden OT Short Term Goal 3 - Progress (Week 3): Met OT Short Term Goal 4 (Week 3): Pt will don shirt wiht MIN A OT Short Term Goal 4 - Progress (Week 3): Met OT Short Term Goal 5 (Week 3): Pt will sit EOM dynamically reaching outside BOS during functional activity with MOD A for LOB correction OT Short Term Goal 5 - Progress (Week 3): Met Week 4:  OT Short Term Goal 1 (Week 4): Pt will sit EOM during functional activity with min A for 5 mins OT Short Term Goal 2 (Week 4): Pt wash UB with no VC for sequencing to demonstrate R attention OT Short Term Goal 3 (Week 4): Pt will complete SBT to [facility] with min A OT Short Term Goal 4 (Week 4): Pt will complete sit <> stand with mod A for clothing management  Skilled Therapeutic Interventions/Progress Updates:    Pt received sitting up in bed with SLP present finishing session. Pt verbalized no pain at start of session and agreeable to tx with OT. Pt transfers sidelying <> EOB with min A. Pt sits EOB with mod A for dynamic sitting balance and mod A to don socks. Pt completes LB dressing sitting EOB; able to thread B LE into pants with mod A for sitting balance and requires max A sit <> stand for clothing management. Pt completed SBT to w/c with mod A and min VC for hand placement. Pt completed oral care in w/c at sink with min A. Pt transported  to therapy gym, completed functional activity with horseshoes to address weight bearing through R UE, functional use of L UE, endurance and sit <> stand in preparation for carryover to ADL activities. Horseshoes were placed in elevated R side position; requiring pt to complete functional reaching with L hand. Pt completes sit <> stand with max A and completes 4 rounds of horseshoes, two in sitting and two in standing, with rest breaks given in between. Exited session with pt in recliner,  safety belt on and all needs met.  Therapy Documentation Precautions:  Precautions Precautions: Fall Precaution Comments: L crani Restrictions Weight Bearing Restrictions: No         Therapy/Group: Individual Therapy  Admiral Marcucci 11/12/2018, 1:23 PM

## 2018-11-12 NOTE — Progress Notes (Signed)
Speech Language Pathology Weekly Progress and Session Note  Patient Details  Name: Gregory Moreno MRN: [id number] Date of Birth: [birth year]  Beginning of progress report period: November 05, 2018 End of progress report period: November 12, 2018  Today's Date: 11/12/2018 SLP Individual Time: 0730-0800 SLP Individual Time Calculation (min): 30 min  Short Term Goals: Week 3: SLP Short Term Goal 1 (Week 3): Patient will vocalize on command in 25% of opportunities with Max multimodal cues. SLP Short Term Goal 1 - Progress (Week 3): Met SLP Short Term Goal 2 (Week 3): Patient will utilize multimodal communication to express basic wants/needs with Max A verbal cues. SLP Short Term Goal 2 - Progress (Week 3): Met SLP Short Term Goal 3 (Week 3): Patient will demonstrate sustained attention to functional tasks for 30 minutes with Mod verbal cues for redirection. SLP Short Term Goal 3 - Progress (Week 3): Met SLP Short Term Goal 4 (Week 3): Patient will demonstrate basic problem solving with Mod verbal cues with functional and familair tasks. SLP Short Term Goal 4 - Progress (Week 3): Met SLP Short Term Goal 5 (Week 3): Patient will be oriented to place and time with Mod A verbal cues. SLP Short Term Goal 5 - Progress (Week 3): Met SLP Short Term Goal 6 (Week 3): Patient will consume current diet of Dys. 3 textures with thin liquids with minimal overt s/s of aspiration with Min verbal cues for use of compensatory strategies. SLP Short Term Goal 6 - Progress (Week 3): Met    New Short Term Goals: Week 4: SLP Short Term Goal 1 (Week 4): Pt will consume trials of regular diet with minimal overt s/s of aspiration/dysphagia and Min A cues for use of compensatory swallow strategies over 3 sessions prior to upgrade. SLP Short Term Goal 2 (Week 4): Pt will utilize word finding strategies to express wants and needs with Mod A cues. SLP Short Term Goal 3 (Week 4): Pt will demonstrate selective attention to  task for ~ 30 minutes with Min A cues. SLP Short Term Goal 4 (Week 4): Pt will complete basic problem solving tasks with Min A cues.  Weekly Progress Updates:  Pt has made great progress over the last reporting period and as a result he has met 6 of 6 STGs. Over this reporting period, pt began speaking. Currently his speech is c/b low vocal intensity and appears aphasic (rather than related to memory deficits). His verbal expression is c/b perseveration and phonemic paraphasias. While is able to read at the word level, his reading at the phrase and sentence level is impaired with decreased awareness of impairment. Pt has also demonstrated improved safety with consumption of dysphagia 3 and trials of regular diet textures.  As a result, skilled ST continues to be indicated to increase functional independence and reduce caregiver burden. Education with pt and his parents is ongoing.      Intensity: Minumum of 1-2 x/day, 30 to 90 minutes Frequency: 3 to 5 out of 7 days Duration/Length of Stay: 11/21/18 Treatment/Interventions: Cognitive remediation/compensation;Dysphagia/aspiration precaution training;Internal/external aids;Speech/Language facilitation;Therapeutic Activities;Environmental controls;Cueing hierarchy;Functional tasks;Patient/family education   Daily Session  Skilled Therapeutic Interventions:   Skilled treatment session focused on dysphagia and communication goals. SLP received pt in bed, awake and alert, ready to participate in therapy. Pt able to correctly recall that he had not eaten breakfast, and able to recall orientation information x 4. With question cues, pt able to recall compensatory swallow strategies (he reported need for small bites  and slow rate). Pt was allowed therapuetic opportunities to verbally request/direct care and pt was effective (such as requesting assistance with cutting peaches, requesting towel to cover his shirt). [name] verbal communication is c/b decreased  vocal intensity that reduced speech intelligibility to ~ 50% at the phrase level. Despite Mod A cues, pt unable to increase vocal intensity.  Skilled treatment session also focused on dysphagia. Skilled observation was provided of pt consuming dysphagia 3 breakfast tray with thin liquids and regular food items were added to tray. After initial review of compensatory swallow strategies, pt was appropriate in utilizing strategies. Plan for trial of regular lunch tray this day. Pt handed off to OT.   Skilled treatment session focused on dysphagia and communication. SLP facilitated session by providing skilled observation of pt consuming regular lunch tray consisting of hamburger with several slices of bacon. After supervision level questions, pt able to effectively demonstrate small bites and slow rate. Pt with effective mastication and oral clearing. Pt would benefit from another trial with a meal. [name] mother present and pt demonstrated decreased verbal expression and decreased direction of care. Education provided to pt and his mother on need for pt to express/direct his own care. Pt also demonstrated word finding deficits when speaking about his vehicles. Providing 3 choices helped pt produce appropriate word. Pt left upright in recliner, lap belt alarm on and all needs within reach. Mother present.      General    Pain Pain Assessment Pain Scale: 0-10 Pain Score: 0-No pain  Therapy/Group: Individual Therapy  Law Corsino 11/12/2018, 10:47 AM

## 2018-11-12 NOTE — Progress Notes (Signed)
Physical Therapy Weekly Progress Note  Patient Details  Name: Gregory Moreno MRN: [id number] Date of Birth: Jan 12, 1996  Beginning of progress report period: November 04, 2018 End of progress report period: November 12, 2018  Today's Date: 11/12/2018 PT Individual Time: 1022-1131 PT Individual Time Calculation (min): 69 min   Patient has met 3 of 3 short term goals.  Pt is making good progress towards all LTG's as he currently requires mod assist for slide board transfers & min assist for w/c mobility. Pt demonstrates improved awareness of body & is able to correct & find midline orientation with less cuing. [name] family have begun to participate in hands on caregiver training. Pt would benefit from continued skilled PT treatment to focus on deficits noted above & below, to increase independence with functional mobility training & for ongoing caregiver training prior to d/c.  Patient continues to demonstrate the following deficits muscle weakness, decreased cardiorespiratoy endurance, decreased coordination and decreased motor planning, decreased visual perceptual skills, decreased attention to right, decreased initiation, decreased attention, decreased awareness, decreased problem solving, decreased safety awareness, decreased memory and delayed processing, and decreased sitting balance, decreased standing balance, decreased postural control, decreased balance strategies and R hemiparesis and therefore will continue to benefit from skilled PT intervention to increase functional independence with mobility.  Patient progressing toward long term goals..  Plan of care revisions: goals upgraded to min/mod assist overall 2/2 good, consistent progress this past week..  PT Short Term Goals Week 3:  PT Short Term Goal 1 (Week 3): Pt will consistently complete all bed mobility tasks with max assist +1. PT Short Term Goal 1 - Progress (Week 3): Met PT Short Term Goal 2 (Week 3): Pt will consistently  complete bed<>w/c transfers with max assist +1. PT Short Term Goal 2 - Progress (Week 3): Met PT Short Term Goal 3 (Week 3): Pt will propel w/c 100 ft with min assist. PT Short Term Goal 3 - Progress (Week 3): Met Week 4:  PT Short Term Goal 1 (Week 4): Pt will complete bed<>w/c transfers consistently with mod assist +1. PT Short Term Goal 2 (Week 4): Pt will complete car transfer with max assist +1. PT Short Term Goal 3 (Week 4): Pt will consistently propel w/c 100 ft with supervision.  Skilled Therapeutic Interventions/Progress Updates:  Pt received in recliner & agreeable to tx. Pt completes stand pivot recliner>w/c on R with max assist with cuing for overall technique & sequencing, therapist assisting with pivoting and placement of buttocks in seat & blocking R knee. [name] mother present for session & participated in hands on caregiver training. In dayroom, pt completes squat pivot w/c>mat on R with max +1 assist but requires total +2 assist to pivot back to w/c on L. Provided pt with slide board & pt completes slide board transfer w/c<>mat table (equal heights) with mod assist +1 overall. Therapist educates pt & mother on w/c parts management, w/c set up, slide board placement and head/hips relationship. This therapist recommends pt & parents use slide board for transfers at home to increase ease of movement. Pt & mother [name]) complete slide board w/c<>mat table x 2 trials with therapist providing ongoing cuing for overall technique as pt attempts to stand during first transfer, but does better job at utilizing board for 2nd transfer. [name] mother also demonstrates improved slide board placement during 2nd transfer as well. Provided [name] with home measurement sheet to obtain measurement of cars & other various surfaces pt may sit at home.  Pt propels w/c back to room with min assist & L hemi technique with ongoing cuing for technique as pt continues to steer R & with difficulty avoiding  obstacles. Back in room, pt transfers w/c>recliner with slide board & mod assist. Pt left in recliner with chair alarm donned, call bell in reach, & mother present to supervise.  Therapy Documentation Precautions:  Precautions Precautions: Fall Precaution Comments: L crani Restrictions Weight Bearing Restrictions: No   Pain: No c/o pain during session. [name] mother reports pt was experiencing R hip pain prior to PT arrival but reports she assisted pt with repositioning for increased comfort.  Therapy/Group: Individual Therapy  [name] 11/12/2018, 12:18 PM

## 2018-11-12 NOTE — Progress Notes (Signed)
[place] PHYSICAL MEDICINE & REHABILITATION PROGRESS NOTE  Subjective/Complaints:  Gregory Moreno is feeling well this morning. He feels that his speech is continuing to improve. He is getting ready to go to OT with Gregory Moreno and Gregory Moreno who say that his speech is more audible.  ROS: Limited due to cognitive/language    Objective: Vital Signs: Blood pressure 113/65, pulse 81, temperature 98 F (36.7 C), temperature source Oral, resp. rate 16, height 5\' 7"  (1.702 m), weight 72 kg, SpO2 99 %. [name] Wo Contrast  Result Date: 11/11/2018 CLINICAL DATA:  Ped, neoplasm; CNS primary, follow-up. 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 EXAM: MRI HEAD WITHOUT AND WITH CONTRAST TECHNIQUE: Multiplanar, multiecho pulse sequences of the brain and surrounding structures were obtained without and with intravenous contrast. CONTRAST:  6mL GADAVIST GADOBUTROL 1 MMOL/ML IV SOLN COMPARISON:  Brain MRI examinations 10/14/2018 and earlier, head CT examinations 10/14/2018 and earlier FINDINGS: Brain: There has been continued interval evolution of postoperative changes related to prior left temporal hematoma decompression and tumor resection. Persistent nonacute blood products within and along the resection/hematoma cavity. Parenchymal edema surrounding the resection cavity has significantly decreased. There are persistent areas of diffusion weighted signal abnormality along portions of the resection cavity which may reflect evolving postoperative/post-ischemic change, although attention is recommended on follow-up imaging to exclude nonenhancing infiltrative tumor. These areas are present along the anteromedial aspect of the resection cavity within the inferior left basal ganglia, anteroinferior left frontal lobe and left subinsular region. These areas are also within the medial left thalamus and within the left temporal stem. There is a  persistent small focus of restricted diffusion within the splenium of the corpus callosum which is favored post ischemic. There is nonspecific curvilinear and slightly irregular enhancement surrounding the hematoma/resection cavity. There is parenchymal edema and gyriform enhancement within portions of the left temporal and occipital lobes. This largely corresponds with regions of previously demonstrated ischemia. Some of the areas of edema and gyriform enhancement within the posterior left temporal lobe and occipital lobe do not correspond with sites of previously demonstrated ischemia, although are suspicious for interval ischemia (now nonacute). 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 within the paramedian left parietal lobe and within the left fornix. Significant interval decrease in mass effect. No midline shift persists. An extra-axial collection deep to the cranioplasty has not significantly changed, again measuring 8 mm in width. Persistent small fluid collection overlying the cranioplasty. Vascular: Flow voids maintained within the proximal large arterial vessels. Skull and upper cervical spine: No focal marrow lesion. Left-sided cranioplasty Sinuses/Orbits: Visualized orbits demonstrate no acute abnormality. Trace ethmoid sinus mucosal thickening. No significant mastoid effusion IMPRESSION: 1. Interval evolution of postoperative changes related to previous left temporal hematoma decompression and tumor resection as detailed. 2. Regions of persistent diffusion-weighted signal abnormality along portions of the resection cavity which may reflect evolving postoperative/post-ischemic change, although close imaging follow-up is recommended to exclude nonenhancing infiltrative tumor. 3. Nonspecific curvilinear and slightly irregular enhancement surrounding the resection/hematoma cavity. Attention recommended on follow-up. 4. Redemonstrated multifocal post ischemic changes. Areas of edema  and gyriform enhancement within portions of the posterior left temporal lobe and occipital lobe do not correspond with sites of previously demonstrated ischemia, although are suspicious for interval ischemia (now non-acute). 5. Significant interval decrease in mass effect. No midline shift persists. 6. Unchanged extra-axial collection deep to the cranioplasty measuring 8 mm in thickness. Electronically Signed   By: Kellie Simmering  DO   On: 11/11/2018 15:38   Recent Labs    11/10/18 0819  WBC 8.0  HGB 13.0  HCT 39.9  PLT 214   Recent Labs    11/10/18 0819  NA 138  K 3.9  CL 100  CO2 27  GLUCOSE 100*  BUN 15  CREATININE 0.84  CALCIUM 9.8    Physical Exam: BP 113/65 (BP Location: Left Arm)   Pulse 81   Temp 98 F (36.7 C) (Oral)   Resp 16   Ht 5\' 7"  (1.702 m)   Wt 72 kg   SpO2 99%   BMI 24.86 kg/m  Constitutional: No distress . Vital signs reviewed. Lying in bed talking with OT, very pleasant and wants to go to therapy HEENT: EOMI, oral membranes moist Neck: supple Cardiovascular: RRR without murmur. No JVD    Respiratory: CTA Bilaterally without wheezes or rales. Normal effort    GI: BS +, non-tender, non-distended   Skin: Warm and dry.  Intact. Psych: alert and smiling Musc: No edema in extremities.  No tenderness in extremities. Neuro: Alert, engages more. Speaking in phrases and short sentences. Responses appropriate to questions asked.  Motor:  RUE and RLE 0/5, trace resting tone---no increase in tone today  Assessment/Plan: 1. 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  Physiatrist is providing close team supervision and 24 hour management of active medical problems listed below.  Physiatrist and rehab team continue to assess barriers to discharge/monitor patient progress toward functional and medical goals  Care  Tool:  Bathing    Body parts bathed by patient: Right arm, Chest, Abdomen, Left upper leg, Right upper leg, Face, Front perineal area   Body parts bathed by helper: Left arm, Buttocks, Right lower leg, Left lower leg     Bathing assist Assist Level: Moderate Assistance - Patient 50 - 74%     Upper Body Dressing/Undressing Upper body dressing   What is the patient wearing?: Pull over shirt    Upper body assist Assist Level: Minimal Assistance - Patient > 75%    Lower Body Dressing/Undressing Lower body dressing      What is the patient wearing?: Pants     Lower body assist Assist for lower body dressing: Maximal Assistance - Patient 25 - 49%     Toileting Toileting    Toileting assist Assist for toileting: Total Assistance - Patient < 25%     Transfers Chair/bed transfer  Transfers assist  Chair/bed transfer activity did not occur: Safety/medical concerns  Chair/bed transfer assist level: Maximal Assistance - Patient 25 - 49%     Locomotion Ambulation   Ambulation assist   Ambulation activity did not occur: Safety/medical concerns  Assist level: 2 helpers Assistive device: Other (comment)(rail in hallway) Max distance: 10'   Walk 10 feet activity   Assist  Walk 10 feet activity did not occur: Safety/medical concerns  Assist level: 2 helpers Assistive device: Other (comment)(rail in hallway)   Walk 50 feet activity   Assist Walk 50 feet with 2 turns activity did not occur: Safety/medical concerns         Walk 150 feet activity   Assist Walk 150 feet activity did not occur: Safety/medical concerns         Walk 10 feet on uneven surface  activity   Assist Walk 10 feet on uneven surfaces activity did not occur: Safety/medical concerns  Wheelchair     Assist Will patient use wheelchair at discharge?: Yes Type of Wheelchair: Manual Wheelchair activity did not occur: Safety/medical concerns  Wheelchair assist level:  Moderate Assistance - Patient 50 - 74%, Minimal Assistance - Patient > 75% Max wheelchair distance: >43'    Wheelchair 50 feet with 2 turns activity    Assist    Wheelchair 50 feet with 2 turns activity did not occur: Safety/medical concerns   Assist Level: Moderate Assistance - Patient 50 - 74%, Minimal Assistance - Patient > 75%   Wheelchair 150 feet activity     Assist Wheelchair 150 feet activity did not occur: Safety/medical concerns   Assist Level: Minimal Assistance - Patient > 75%, Moderate Assistance - Patient 50 - 74%      Medical Problem List and Plan: 1.Right greater than left-sided weaknesssecondary to intracerebral hemorrhage with GBM.Surgery was done emergently due to signs of herniation due to notes.Status post left crani for hematoma evacuation and tumor resection 10/13/2018.   Continue CIR PT, OT, SLP--making gains in communication/initiation  PRAFO, WHO at night  Neuro oncology with extensive and productive discussion with patient and family this week regarding diagnosis and prognosis, appreciate assistance   -MRI performed yesterday (see results above): significant findings include interval evolution of postoperative changes related to previous left temporal hematoma decompression and tumor resection, regions of persistent diffusion-weighted signal abnormality which may reflect evolving postoperative/post-ischemic change, although close imaging follow-up is recommended to exclude nonenhancing infiltrative tumor, significant interval decrease in mass effect. No midline shift persists.    -XRT and CTX potentially 4-6 weeks post-op or once max functional gains reached/dc from rehab   -Appreciate the involvement of Dr. [name] and palliative care 2. Antithrombotics: -DVT/anticoagulation:Subcutaneous heparin initiated 10/16/2018 -antiplatelet therapy: N/A 3. Pain Management:Tylenol as needed 4. Mood:Provide emotional  support -antipsychotic agents: N/A 5. Neuropsych: This patientis not of making decisions on hisown behalf. 6. Skin/Wound Care:Routine skin checks 7. Fluids/Electrolytes/Nutrition:intake 100% 8. Dysphagia. Dysphagia #2 thin liquids. Follow-up speech therapy  Advance diet as tolerated 9. 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 Constipation  Bowel meds as necessary 11. Spasticity  Baclofen 5mg  2 times daily started on 10/9---increased to TID 10/13  -tone controlled---no changes to regimen, will continue to monitor 12. Hiccups  Baclofen as needed  Improved 13.  Steroid-induced hyperglycemia  Continue to monitor with steroid wean 14.  Hyponatremia  Sodium 138 10/19  10/26- Na 138 15.  Transaminitis  LFTs   stable-slightly improved on 10/12 16.  Leukocytosis-  steroid-induced  WBCs down to 6.7 10/19  -10/26- WBC 8k   LOS: 22 days A FACE TO FACE EVALUATION WAS PERFORMED  Gregory Moreno 11/12/2018, 8:45 AM

## 2018-11-12 NOTE — Plan of Care (Signed)
[name] goals upgraded to min/mod assist overall 2/2 good, consistent progress.  Problem: RH Balance Goal: LTG Patient will maintain dynamic sitting balance (PT) Description: LTG:  Patient will maintain dynamic sitting balance with assistance during mobility activities (PT) Flowsheets (Taken 11/12/2018 1217) LTG: Pt will maintain dynamic sitting balance during mobility activities with:: (upgrade 2/2 good progress) Minimal Assistance - Patient > 75%   Problem: Sit to Stand Goal: LTG:  Patient will perform sit to stand with assistance level (PT) Description: LTG:  Patient will perform sit to stand with assistance level (PT) Flowsheets (Taken 11/12/2018 1217) LTG: PT will perform sit to stand in preparation for functional mobility with assistance level: (upgrade 2/2 good progress) Moderate Assistance - Patient 50 - 74%   Problem: RH Bed to Chair Transfers Goal: LTG Patient will perform bed/chair transfers w/assist (PT) Description: LTG: Patient will perform bed to chair transfers with assistance (PT). Flowsheets (Taken 11/12/2018 1217) LTG: Pt will perform Bed to Chair Transfers with assistance level: (upgrade 2/2 good progress) Minimal Assistance - Patient > 75%   Problem: RH Car Transfers Goal: LTG Patient will perform car transfers with assist (PT) Description: LTG: Patient will perform car transfers with assistance (PT). Flowsheets (Taken 11/12/2018 1217) LTG: Pt will perform car transfers with assist:: (upgrade 2/2 good progress) Moderate Assistance - Patient 50 - 74%   Problem: RH Wheelchair Mobility Goal: LTG Patient will propel w/c in controlled environment (PT) Description: LTG: Patient will propel wheelchair in controlled environment, # of feet with assist (PT) Flowsheets (Taken 11/12/2018 1217) LTG: Pt will propel w/c in controlled environ  assist needed:: (upgrade 2/2 good progress) Supervision/Verbal cueing LTG: Propel w/c distance in controlled environment: 150 ft

## 2018-11-12 NOTE — Progress Notes (Signed)
Physical Therapy Session Note  Patient Details  Name: Gregory Moreno MRN: [id number] Date of Birth: 06/21/1995  Today's Date: 11/12/2018 PT Individual Time: 1502-1530 PT Individual Time Calculation (min): 28 min   Short Term Goals: Week 4:  PT Short Term Goal 1 (Week 4): Pt will complete bed<>w/c transfers consistently with mod assist +1. PT Short Term Goal 2 (Week 4): Pt will complete car transfer with max assist +1. PT Short Term Goal 3 (Week 4): Pt will consistently propel w/c 100 ft with supervision.  Skilled Therapeutic Interventions/Progress Updates:  Pt received in recliner & agreeable to tx, mother present to observe session. Pt transfers recliner<>w/c with stand pivot max assist with cuing for hand placement and pt with improved ability to push to stand but requires ongoing assist to weight shift & pivot to chair. Transported to/from dayroom via w/c dependent assist for time management. Pt transfers w/c<>mat table in same manner as noted above. Pt transfers sit>R sidelying with mod assist & rolls to supine with ongoing cuing for compensatory technique & using LUE to assist RLE with task. Assisted pt into prone position for hip flexor stretch, which pt tolerated well x 5 minutes. Then provided total assist +2 to transition to tall kneeling with BUE support on bench with therapist providing manual facilitation for anterior pelvic shift & upright trunk; pt able to achieve most neutral pelvic alignment during this task that he has exhibited while in CIR. Pt assisted back to room & left in recliner with chair alarm donned, call bell in reach, & mother present to supervise.  Therapy Documentation Precautions:  Precautions Precautions: Fall Precaution Comments: L crani Restrictions Weight Bearing Restrictions: No  Pain: Pt c/o discomfort in RUE with prone position - therapist repositioned RUE & pt reports comfort.   Therapy/Group: Individual Therapy  [name] 11/12/2018,  3:37 PM

## 2018-11-13 ENCOUNTER — Inpatient Hospital Stay (HOSPITAL_COMMUNITY): Payer: 59 | Admitting: Speech Pathology

## 2018-11-13 ENCOUNTER — Inpatient Hospital Stay (HOSPITAL_COMMUNITY): Payer: 59

## 2018-11-13 ENCOUNTER — Inpatient Hospital Stay (HOSPITAL_COMMUNITY): Payer: 59 | Admitting: Physical Therapy

## 2018-11-13 ENCOUNTER — Inpatient Hospital Stay (HOSPITAL_COMMUNITY): Payer: 59 | Admitting: Occupational Therapy

## 2018-11-13 MED ORDER — WHITE PETROLATUM EX OINT
TOPICAL_OINTMENT | CUTANEOUS | Status: AC
  Administered 2018-11-13: 0.2
  Filled 2018-11-13: qty 28.35

## 2018-11-13 NOTE — Progress Notes (Signed)
Occupational Therapy Session Note  Patient Details  Name: Gregory Moreno MRN: [id number] Date of Birth: 03-08-1995  Today's Date: 11/13/2018 OT Individual Time: 1445-1515 OT Individual Time Calculation (min): 30 min     Skilled Therapeutic Interventions/Progress Updates:    Upon entering the room, pt seated in recliner chair with father present in room. Pt with no c/o, signs, or symptoms of pain this session. Pt standing from recliner chair with mod A. Pt required max A for stand pivot transfer into wheelchair with therapist. Pt needing increased time, assist for weight shift, and assistance to move R LE for transfer. Pt seated in wheelchair and pt began scratching incision on scalp and making it bleed. OT assisted pt to sink for hand hygiene. Pt with no noted active movement in R UE. OT performed scapular mobilizations in all planes of movement with pt not reporting any pain. OT provided education and demonstration for self ROM with R UE. Pt returning demonstrations with min cuing for proper technique. Pt remained in wheelchair with father present. All needs within reach.   Therapy Documentation Precautions:  Precautions Precautions: Fall Precaution Comments: L crani Restrictions Weight Bearing Restrictions: No General:   Vital Signs: Therapy Vitals Temp: 97.6 F (36.4 C) Pulse Rate: 94 Resp: 18 BP: 125/67 Patient Position (if appropriate): Sitting Oxygen Therapy SpO2: 99 % O2 Device: Room Air ADL: ADL Grooming: Maximal assistance Where Assessed-Grooming: Sitting at sink, Wheelchair Upper Body Bathing: Maximal assistance Where Assessed-Upper Body Bathing: Sitting at sink, Wheelchair Lower Body Bathing: Maximal assistance Where Assessed-Lower Body Bathing: Sitting at sink, Wheelchair Upper Body Dressing: Maximal assistance Where Assessed-Upper Body Dressing: Sitting at sink, Wheelchair Lower Body Dressing: Maximal assistance Where Assessed-Lower Body Dressing: Sitting  at sink, Wheelchair Toileting: Unable to assess Toilet Transfer: Dependent(+2) Toilet Transfer Method: (STEDY)   Therapy/Group: Individual Therapy  [name] 11/13/2018, 4:23 PM

## 2018-11-13 NOTE — Progress Notes (Signed)
[facility] PHYSICAL MEDICINE & REHABILITATION PROGRESS NOTE  Subjective/Complaints:  No new complaints. In bed watching tv. Denies pain  ROS: limited due to language/communication    Objective: Vital Signs: Blood pressure 107/66, pulse 69, temperature 97.9 F (36.6 C), temperature source Oral, resp. rate 16, height 5\' 7"  (1.702 m), weight 65.3 kg, SpO2 99 %. [name] Wo Contrast  Result Date: 11/11/2018 CLINICAL DATA:  Ped, neoplasm; CNS primary, follow-up. 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 EXAM: MRI HEAD WITHOUT AND WITH CONTRAST TECHNIQUE: Multiplanar, multiecho pulse sequences of the brain and surrounding structures were obtained without and with intravenous contrast. CONTRAST:  48mL GADAVIST GADOBUTROL 1 MMOL/ML IV SOLN COMPARISON:  Brain MRI examinations 10/14/2018 and earlier, head CT examinations 10/14/2018 and earlier FINDINGS: Brain: There has been continued interval evolution of postoperative changes related to prior left temporal hematoma decompression and tumor resection. Persistent nonacute blood products within and along the resection/hematoma cavity. Parenchymal edema surrounding the resection cavity has significantly decreased. There are persistent areas of diffusion weighted signal abnormality along portions of the resection cavity which may reflect evolving postoperative/post-ischemic change, although attention is recommended on follow-up imaging to exclude nonenhancing infiltrative tumor. These areas are present along the anteromedial aspect of the resection cavity within the inferior left basal ganglia, anteroinferior left frontal lobe and left subinsular region. These areas are also within the medial left thalamus and within the left temporal stem. There is a persistent small focus of restricted diffusion within the splenium of the corpus callosum which is favored post ischemic. There is  nonspecific curvilinear and slightly irregular enhancement surrounding the hematoma/resection cavity. There is parenchymal edema and gyriform enhancement within portions of the left temporal and occipital lobes. This largely corresponds with regions of previously demonstrated ischemia. Some of the areas of edema and gyriform enhancement within the posterior left temporal lobe and occipital lobe do not correspond with sites of previously demonstrated ischemia, although are suspicious for interval ischemia (now nonacute). 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 within the paramedian left parietal lobe and within the left fornix. Significant interval decrease in mass effect. No midline shift persists. An extra-axial collection deep to the cranioplasty has not significantly changed, again measuring 8 mm in width. Persistent small fluid collection overlying the cranioplasty. Vascular: Flow voids maintained within the proximal large arterial vessels. Skull and upper cervical spine: No focal marrow lesion. Left-sided cranioplasty Sinuses/Orbits: Visualized orbits demonstrate no acute abnormality. Trace ethmoid sinus mucosal thickening. No significant mastoid effusion IMPRESSION: 1. Interval evolution of postoperative changes related to previous left temporal hematoma decompression and tumor resection as detailed. 2. Regions of persistent diffusion-weighted signal abnormality along portions of the resection cavity which may reflect evolving postoperative/post-ischemic change, although close imaging follow-up is recommended to exclude nonenhancing infiltrative tumor. 3. Nonspecific curvilinear and slightly irregular enhancement surrounding the resection/hematoma cavity. Attention recommended on follow-up. 4. Redemonstrated multifocal post ischemic changes. Areas of edema and gyriform enhancement within portions of the posterior left temporal lobe and occipital lobe do not correspond with sites of  previously demonstrated ischemia, although are suspicious for interval ischemia (now non-acute). 5. Significant interval decrease in mass effect. No midline shift persists. 6. Unchanged extra-axial collection deep to the cranioplasty measuring 8 mm in thickness. Electronically Signed   By: Kellie Simmering DO   On: 11/11/2018 15:38   No results for input(s): WBC, HGB, HCT, PLT in the last 72 hours. No results for input(s): NA, K,  CL, CO2, GLUCOSE, BUN, CREATININE, CALCIUM in the last 72 hours.  Physical Exam: BP 107/66 (BP Location: Left Arm)   Pulse 69   Temp 97.9 F (36.6 C) (Oral)   Resp 16   Ht 5\' 7"  (1.702 m)   Wt 65.3 kg   SpO2 99%   BMI 22.55 kg/m  Constitutional: No distress . Vital signs reviewed. HEENT: EOMI, oral membranes moist Neck: supple Cardiovascular: RRR without murmur. No JVD    Respiratory: CTA Bilaterally without wheezes or rales. Normal effort    GI: BS +, non-tender, non-distended  Skin: Warm and dry.  Intact. Psych: alert and smiling Musc: No edema in extremities.  No tenderness in extremities. Neuro: alert, engaging, initiates with left side quickly Motor:  RUE and RLE 0/5, trace resting tone ongoing.   Assessment/Plan: 1. 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  Physiatrist is providing close team supervision and 24 hour management of active medical problems listed below.  Physiatrist and rehab team continue to assess barriers to discharge/monitor patient progress toward functional and medical goals  Care Tool:  Bathing    Body parts bathed by patient: Right arm, Chest, Abdomen, Left upper leg, Right upper leg, Face, Front perineal area   Body parts bathed by helper: Left arm, Buttocks, Right lower leg, Left lower leg     Bathing assist Assist Level: Moderate Assistance - Patient 50 - 74%     Upper Body  Dressing/Undressing Upper body dressing   What is the patient wearing?: Pull over shirt    Upper body assist Assist Level: Minimal Assistance - Patient > 75%    Lower Body Dressing/Undressing Lower body dressing      What is the patient wearing?: Pants     Lower body assist Assist for lower body dressing: Moderate Assistance - Patient 50 - 74%     Toileting Toileting    Toileting assist Assist for toileting: Total Assistance - Patient < 25%     Transfers Chair/bed transfer  Transfers assist  Chair/bed transfer activity did not occur: Safety/medical concerns  Chair/bed transfer assist level: Maximal Assistance - Patient 25 - 49%     Locomotion Ambulation   Ambulation assist   Ambulation activity did not occur: Safety/medical concerns  Assist level: 2 helpers Assistive device: Other (comment)(rail in hallway) Max distance: 10'   Walk 10 feet activity   Assist  Walk 10 feet activity did not occur: Safety/medical concerns  Assist level: 2 helpers Assistive device: Other (comment)(rail in hallway)   Walk 50 feet activity   Assist Walk 50 feet with 2 turns activity did not occur: Safety/medical concerns         Walk 150 feet activity   Assist Walk 150 feet activity did not occur: Safety/medical concerns         Walk 10 feet on uneven surface  activity   Assist Walk 10 feet on uneven surfaces activity did not occur: Safety/medical concerns         Wheelchair     Assist Will patient use wheelchair at discharge?: Yes Type of Wheelchair: Manual Wheelchair activity did not occur: Safety/medical concerns  Wheelchair assist level: Minimal Assistance - Patient > 75% Max wheelchair distance: 150 ft    Wheelchair 50 feet with 2 turns activity    Assist    Wheelchair 50 feet with 2 turns activity did not occur: Safety/medical concerns  Assist Level: Minimal Assistance - Patient > 75%   Wheelchair 150 feet activity      Assist Wheelchair 150 feet activity did not occur: Safety/medical concerns   Assist Level: Minimal Assistance - Patient > 75%      Medical Problem List and Plan: 1.Right greater than left-sided weaknesssecondary to intracerebral hemorrhage with GBM.Surgery was done emergently due to signs of herniation due to notes.Status post left crani for hematoma evacuation and tumor resection 10/13/2018.   Continue CIR PT, OT, SLP--making gains in communication/initiation  PRAFO, WHO at night   -MRI performed Tuesday with inflammatory enhancement of surgical/tumor site. Higher dose steroids initiated   -MRI next month followed XRT and CTX potentially    -Appreciate the involvement of Dr. [name] and palliative care 2. Antithrombotics: -DVT/anticoagulation:Subcutaneous heparin initiated 10/16/2018 -antiplatelet therapy: N/A 3. Pain Management:Tylenol as needed 4. Mood:Provide emotional support -antipsychotic agents: N/A 5. Neuropsych: This patientis not of making decisions on hisown behalf. 6. Skin/Wound Care:Routine skin checks 7. Fluids/Electrolytes/Nutrition:intake 100% 8. Dysphagia. Dysphagia #2 thin liquids. Follow-up speech therapy  Advance diet as tolerated 9. 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 Constipation  Bowel meds as necessary 11. Spasticity  Baclofen 5mg  2 times daily started on 10/9---increased to TID 10/13  -tone controlled---no changes to regimen, will continue to monitor 12. Hiccups  Baclofen as needed  Improved 13.  Steroid-induced hyperglycemia  Continue to monitor with steroid wean 14.  Hyponatremia  Sodium 138 10/19  10/26- Na 138 15.  Transaminitis  LFTs   stable-slightly improved on 10/12 16.  Leukocytosis-  steroid-induced  WBCs down to 6.7 10/19  -10/26- WBC 8k   LOS: 23 days A FACE TO FACE  EVALUATION WAS PERFORMED  Gregory Moreno 11/13/2018, 9:13 AM

## 2018-11-13 NOTE — Progress Notes (Signed)
Physical Therapy Session Note  Patient Details  Name: ISAH CATBAGAN MRN: [id number] Date of Birth: 05-30-1995  Today's Date: 11/13/2018 PT Individual Time: 1100-1200 PT Individual Time Calculation (min): 60 min   Short Term Goals: Week 4:  PT Short Term Goal 1 (Week 4): Pt will complete bed<>w/c transfers consistently with mod assist +1. PT Short Term Goal 2 (Week 4): Pt will complete car transfer with max assist +1. PT Short Term Goal 3 (Week 4): Pt will consistently propel w/c 100 ft with supervision.  Skilled Therapeutic Interventions/Progress Updates:   Pt in supine and agreeable to therapy, denies pain. [name] father present throughout session. Supine>sit w/ min assist and tactile/verbal cues for technique. Mod assist stand pivot to w/c on pt's L side. Worked on w/c propulsion via L hemi technique in hallway. Pt self-propelled 50' w/ min assist overall to maintain straight line. Pt w/ notably increased speed while performing task this session compared to previous sessions with this therapist.   Educated pt's father on slide board transfers, w/c to/from mat, w/ mod assist. Demonstrated transfer to/from mat first and then father performed w/ pt x2 reps. Verbal, visual, and tactile cues/critque of his assistance, 2nd transfer better than first. Emphasized w/c set-up, board placement, head/hips relationship and anterior trunk lean, and how pt can assist w/ LUE. Educated both pt and father on benefits of using slide board transfers in the future, even if his functional level improves after d/c, on days he is more fatigued w/ radiation.   Worked on gait training at rail in hallway. Ambulated 30' x2 reps w/ mod assist for upright balance, mod-max assist for lateral weight shifting and to reach full upright, and total assist for RLE management including step placement and achieving terminal stance. [name] father in front providing verbal cues for posture, 2nd helper providing w/c follow. Pt w/ trace  R hip flexion in swing phase during first few steps, however unable to detect remainder of gait. Pre-gait tasks in between gait bouts including lateral weight shifting and bilateral partial knee bends in stance w/ emphasis on R quad activation. Needed max-total assist to keep knee from buckling however. All sit<>stands at rail w/ min assist to stand.   Returned to room and ended session in recliner, all needs in reach.   Therapy Documentation Precautions:  Precautions Precautions: Fall Precaution Comments: L crani Restrictions Weight Bearing Restrictions: No Vital Signs: Therapy Vitals Temp: 97.6 F (36.4 C) Pulse Rate: 94 Resp: 18 BP: 125/67 Patient Position (if appropriate): Sitting Oxygen Therapy SpO2: 99 % O2 Device: Room Air  Therapy/Group: Individual Therapy  Taysen Bushart K Kaina Orengo 11/13/2018, 2:15 PM

## 2018-11-13 NOTE — Progress Notes (Signed)
Stopped by to say hi to Gregory Moreno and family.  Shared updated recommendations from brain/spine tumor board meeting 11/14/18:  -Pattern of gyriform enhancement on recent MRI is unusual and of unclear etiology.  It may be representative of inflammatory response, possibly to blood products.  These areas do not overlap with infarcted regions or any DWI positivity.  Regardless, it will be very difficult to construct clear/accurate radiation treatment plan with these findings in place  -Recommend trial of dexamethasone, 4mg  BID x7 days, followed by 4mg  daily thereafter.  Would like to observe for potential clinical changes especially language/cognitive domains -Need to repeat MRI, ideally 11/9 or 11/10, with plan for CT-sim very shortly thereafter and plan to start RT/chemo on 11/16  Ventura Sellers, MD

## 2018-11-13 NOTE — Progress Notes (Signed)
Social Work Patient ID: [name], male   DOB: 1995/05/21, 23 y.o.   MRN: [id number]  Have reviewed conference information this week with pt and parents.  All aware that team recommended an extension of his LOS and targeted a new d/c date of 11/13.  This recommendation is made due to pt continuing to make good progress and very motivated for CIR tx.  Will continue to follow.  Jawon Dipiero, LCSW

## 2018-11-13 NOTE — Progress Notes (Signed)
Speech Language Pathology Daily Session Note  Patient Details  Name: Gregory Moreno MRN: [id number] Date of Birth: Jul 26, 1995  Today's Date: 11/13/2018 SLP Individual Time: 0730-0800; [id number] SLP Individual Time Calculation (min): 30 min; 35 min  Short Term Goals: Week 4: SLP Short Term Goal 1 (Week 4): Pt will consume trials of regular diet with minimal overt s/s of aspiration/dysphagia and Min A cues for use of compensatory swallow strategies over 3 sessions prior to upgrade. SLP Short Term Goal 2 (Week 4): Pt will utilize word finding strategies to express wants and needs with Mod A cues. SLP Short Term Goal 3 (Week 4): Pt will demonstrate selective attention to task for ~ 30 minutes with Min A cues. SLP Short Term Goal 4 (Week 4): Pt will complete basic problem solving tasks with Min A cues.  Skilled Therapeutic Interventions:  Skilled treatment session focused on dysphagia and communication. SLP facilitated session by providing skilled observation of pt consuming regular breakfast tray. Prior to consuming SLP provided supervision level questions regarding compensatory swallow strategies. Pt able to provide correct answers indicating small bites, slow rate. During consumption he was able to utilize these strategies with Mod I. Pt was free of overt s/s of aspiration or dysphagia. Additionally, this Probation officer requested pt's nurse provide medication during session, Pt able to consume whole with thin liquids via straw and no overt s/s of aspiration or dysphagia. Recommend upgrading diet to regular, thin liquids, medicine whole with thins and full supervision to ensure adherence to swallow precautions. Information updated on safety plan and head of bed. SLP further facilitated session by providing Max A cues and multiple examples of utilizing a LOUD voice when speaking. Pt able to demonstrate LOUD voicing during immediate tasks/reciprocal tasks but not able to transfer to self-generated responses.  Pt continues to be oriented x 4 and is able to recall some information about his life (specifically his cars). Additionally, pt able to produce reciprocal phrases without phonemic paraphasias but when producing self-generated phrases his speech contained phonemic paraphasias. Pt was intermittently aware of errors but unable to correct. He benefited from repeating phrase after verbal model.   Skilled treatment session focused on dysphagia and communication goals. SLP facilitated session by providing skilled observation of pt consuming regular lunch tray. Pt consumed with good use of safe swallow strategies. SLP further facilitated session by having pt read Instagram messages at the phrase level from this Probation officer.  Pt demonstrated comprehension of written text as he started to enter the correct response but pt unable to verbally read phrase or produce verbal answer. Pt was able to text correct answer to questions in 5 out of 7  Responses with slight phonemic errors (some were related to touching the wrong letter - pt with appropriate frustration and verbal explicative). When pt wasn't able to provide appropriate response, pt was able to select response in field of 3 and then able to look at printed word and text appropriate word, good problem solving. [name] father present and education provided on progress.          Pain    Therapy/Group: Individual Therapy  Reena Borromeo 11/13/2018, 4:10 PM

## 2018-11-13 NOTE — Plan of Care (Signed)
  Problem: Consults Goal: RH BRAIN INJURY PATIENT EDUCATION Description: Description: See Patient Education module for eduction specifics Outcome: Progressing   Problem: RH BOWEL ELIMINATION Goal: RH STG MANAGE BOWEL WITH ASSISTANCE Description: STG Manage Bowel with max Assistance. Outcome: Progressing Goal: RH STG MANAGE BOWEL W/MEDICATION W/ASSISTANCE Description: STG Manage Bowel with Medication with max Assistance. Outcome: Progressing   Problem: RH BLADDER ELIMINATION Goal: RH STG MANAGE BLADDER WITH ASSISTANCE Description: STG Manage Bladder With max Assistance Outcome: Progressing   Problem: RH SKIN INTEGRITY Goal: RH STG SKIN FREE OF INFECTION/BREAKDOWN Outcome: Progressing Goal: RH STG MAINTAIN SKIN INTEGRITY WITH ASSISTANCE Description: STG Maintain Skin Integrity With max Assistance. Outcome: Progressing Goal: RH STG ABLE TO PERFORM INCISION/WOUND CARE W/ASSISTANCE Description: STG Able To Perform Incision/Wound Care With max Assistance. Outcome: Progressing   Problem: RH SAFETY Goal: RH STG ADHERE TO SAFETY PRECAUTIONS W/ASSISTANCE/DEVICE Description: STG Adhere to Safety Precautions With max Assistance/Device. Outcome: Progressing   Problem: RH COGNITION-NURSING Goal: RH STG ANTICIPATES NEEDS/CALLS FOR ASSIST W/ASSIST/CUES Description: STG Anticipates Needs/Calls for Assist With max Assistance/Cues. Outcome: Progressing   Problem: RH PAIN MANAGEMENT Goal: RH STG PAIN MANAGED AT OR BELOW [name] PAIN GOAL Description: Less than 3 out of 10  Outcome: Progressing   Problem: RH KNOWLEDGE DEFICIT BRAIN INJURY Goal: RH STG INCREASE KNOWLEDGE OF SELF CARE AFTER BRAIN INJURY Outcome: Progressing Goal: RH STG INCREASE KNOWLEDGE OF DYSPHAGIA/FLUID INTAKE Outcome: Progressing   Problem: Consults Goal: Diabetes Guidelines if Diabetic/Glucose > 140 Description: If diabetic or lab glucose is > 140 mg/dl - Initiate Diabetes/Hyperglycemia Guidelines & Document  Interventions  Outcome: Progressing

## 2018-11-13 NOTE — Progress Notes (Signed)
Occupational Therapy Session Note  Patient Details  Name: Gregory Moreno MRN: [id number] Date of Birth: [birth year]  Today's Date: 11/13/2018 OT Individual Time: 0900-1000 OT Individual Time Calculation (min): 60 min    Short Term Goals: Week 4:  OT Short Term Goal 1 (Week 4): Pt will sit EOM during functional activity with min A for 5 mins OT Short Term Goal 2 (Week 4): Pt wash UB with no VC for sequencing to demonstrate R attention OT Short Term Goal 3 (Week 4): Pt will complete SBT to [facility] with min A OT Short Term Goal 4 (Week 4): Pt will complete sit <> stand with mod A for clothing management      Skilled Therapeutic Interventions/Progress Updates:    Pt received supine in bed, c/o headache. RN consulted and came to room to administer pain meds. Pt agreeable to tx with therapy and requesting to shower. Pt completes supine <> EOB with min A for moving legs and min VC for hand placement. Pt sits EOB with min A to swallow medication and don non-skid socks. Pt completes sit <> stand to the stedy with min A and facilitation given through R knee. Pt requesting to get on toilet; voided urine and BM. Pt is Max A for toileting using Stedy. Pt put into shower and completes UB bathing with min A; required help washing L arm and armpit. Pt is mod A for LB bathing to wash R LE and buttocks. Pt educated on using lateral leans to wash buttocks. Pt displayed difficulty terminating washing face; required min VC to finish washing face and begin other body parts. Pt completes UB dressing sitting EOB with mod A for sitting balance and multimodal cueing to correct L and posterior lean. Pt completes LB dressing with mod for sit <> stand; pt able to put pants over buttocks this date. Exited session with pt in bed, bed alarm set and all needs met.   Therapy Documentation Precautions:  Precautions Precautions: Fall Precaution Comments: L crani Restrictions Weight Bearing Restrictions: No       Therapy/Group: Individual Therapy  Arsema Tusing 11/13/2018, 12:00 PM

## 2018-11-14 ENCOUNTER — Inpatient Hospital Stay (HOSPITAL_COMMUNITY): Payer: 59 | Admitting: Physical Therapy

## 2018-11-14 ENCOUNTER — Inpatient Hospital Stay (HOSPITAL_COMMUNITY): Payer: 59 | Admitting: Speech Pathology

## 2018-11-14 ENCOUNTER — Inpatient Hospital Stay (HOSPITAL_COMMUNITY): Payer: 59

## 2018-11-14 NOTE — Progress Notes (Addendum)
Physical Therapy Session Note  Patient Details  Name: Gregory Moreno MRN: [id number] Date of Birth: May 21, 1995  Today's Date: 11/14/2018 PT Individual Time: 0800-0910 AND 1345-1415 PT Individual Time Calculation (min): 70 min AND 30 min  Short Term Goals: Week 4:  PT Short Term Goal 1 (Week 4): Pt will complete bed<>w/c transfers consistently with mod assist +1. PT Short Term Goal 2 (Week 4): Pt will complete car transfer with max assist +1. PT Short Term Goal 3 (Week 4): Pt will consistently propel w/c 100 ft with supervision.  Skilled Therapeutic Interventions/Progress Updates:   Session 1:  Pt in supine and agreeable to therapy, denies pain. Total assist to thread pants, mod assist to bring over hips in supine. Supine>sit w/ min assist and mod assist overall to remove and don new shirt. Min-mod stand pivot to w/c on pt's L side. Total assist w/c transport to/from therapy gym. Session focused on RLE muscle activation and NMR. Supine bridges w/ tactile and verbal cues for R glut activation, strong palpable contraction however pelvis slightly uneven w/ L side dominating. R knee-to-chest rhythmic initiation and then pt asked to contract and perform task himself w/ therapist supporting, no activation detected. Also attempted same motion in L sidelying for gravity eliminated, also unsuccessful. Kinetron w/ RLE only w/ therapist providing assist on other pedal. Tactile and verbal cues for R quad activation. Most R quad activation palpated during sit<>stands at rail and L forward and backward stepping. Strong R quad and hamstring contraction when R knee maintained in slight flexion during weight acceptance, however continues to need max-total assist to keep R knee from buckling majority of time. Did have a few instances of min-mod assist to maintain R knee flexion. Performed blocked practice of L forward and backward stepping to work on R quad activation, multimodal cues for upright posture and  anterior weight shifting at hips, mirror for visual feedback as well. Returned to room via w/c, ended session in recliner and all needs in reach.   Session 2: Pt in recliner and agreeable to therapy, no c/o pain. Stand pivot to w/c on pt's R w/ max assist for RLE placement and weight shifting to R. Worked on w/c propulsion in hallway, self-propelled 150' w/ supervision including 2 turns. Mod verbal and visual cues for R environmental awareness and to keep on straight path. Worked on initiation and increasing speed of LUE movements while playing Wii bowling. Hand-over-hand manual cues to bring LUE through movement to play game and tactile cues to increase trunk rotation w/ bowling movement. Faded to supervision and only verbal cues for technique. Ended session in w/c and in care of pt's dad, playing Wii, and pt's dad agreeable to sit w/ pt and continue playing.   Therapy Documentation Precautions:  Precautions Precautions: Fall Precaution Comments: L crani Restrictions Weight Bearing Restrictions: No Pain: Pain Assessment Pain Scale: 0-10 Pain Score: 0-No pain  Therapy/Group: Individual Therapy  Caylin Raby K Shaquetta Arcos 11/14/2018, 10:19 AM

## 2018-11-14 NOTE — Progress Notes (Signed)
Speech Language Pathology Daily Session Note  Patient Details  Name: Gregory Moreno MRN: [id number] Date of Birth: [birth year]  Today's Date: 11/14/2018 SLP Individual Time: 0700-0800 SLP Individual Time Calculation (min): 60 min  Short Term Goals: Week 4: SLP Short Term Goal 1 (Week 4): Pt will consume trials of regular diet with minimal overt s/s of aspiration/dysphagia and Min A cues for use of compensatory swallow strategies over 3 sessions prior to upgrade. SLP Short Term Goal 2 (Week 4): Pt will utilize word finding strategies to express wants and needs with Mod A cues. SLP Short Term Goal 3 (Week 4): Pt will demonstrate selective attention to task for ~ 30 minutes with Min A cues. SLP Short Term Goal 4 (Week 4): Pt will complete basic problem solving tasks with Min A cues.  Skilled Therapeutic Interventions:  Skilled treatment session focused on dysphagia and communication goals. SLP facilitated session by providing skilled observation of pt consuming regular breakfast tray. Of note, SLP did not review compensatory swallow strategies prior to pt consuming items. As a result, he consumed all items at very fast rate and required Min A cues to slow rate and bolus size. SLP further facilitated session by providing more opportunities for pt to respond to written message from this writer on Instagram. Pt required Max A cues to comprehend and write responses. Decline from previous session. Pt left upright in bed, bed alarm on and all needs within reach. Continue per current plan of care.      Pain Pain Assessment Pain Scale: 0-10 Pain Score: 0-No pain  Therapy/Group: Individual Therapy  Gregory Moreno 11/14/2018, 2:02 PM

## 2018-11-14 NOTE — Progress Notes (Signed)
[place] PHYSICAL MEDICINE & REHABILITATION PROGRESS NOTE  Subjective/Complaints:  Pt up with therapy. No new issues overnight.   ROS: Limited due to cognitive/language  Objective: Vital Signs: Blood pressure 115/61, pulse 70, temperature (!) 97.5 F (36.4 C), temperature source Oral, resp. rate 18, height 5\' 7"  (1.702 m), weight 65.3 kg, SpO2 98 %. No results found. No results for input(s): WBC, HGB, HCT, PLT in the last 72 hours. No results for input(s): NA, K, CL, CO2, GLUCOSE, BUN, CREATININE, CALCIUM in the last 72 hours.  Physical Exam: BP 115/61 (BP Location: Left Arm)   Pulse 70   Temp (!) 97.5 F (36.4 C) (Oral)   Resp 18   Ht 5\' 7"  (1.702 m)   Wt 65.3 kg   SpO2 98%   BMI 22.55 kg/m  Constitutional: No distress . Vital signs reviewed. HEENT: EOMI, oral membranes moist Neck: supple Cardiovascular: RRR without murmur. No JVD    Respiratory: CTA Bilaterally without wheezes or rales. Normal effort    GI: BS +, non-tender, non-distended   Skin: Warm and dry.  Intact. Psych: alert and smiling Musc: No edema in extremities.  No tenderness in extremities. Neuro: alert, engages more promptly Motor:  RUE and RLE 0/5, trace resting tone without changes   Assessment/Plan: 1. 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  Physiatrist is providing close team supervision and 24 hour management of active medical problems listed below.  Physiatrist and rehab team continue to assess barriers to discharge/monitor patient progress toward functional and medical goals  Care Tool:  Bathing    Body parts bathed by patient: Right arm, Chest, Front perineal area, Abdomen, Right upper leg, Left upper leg, Face   Body parts bathed by helper: Left arm, Buttocks, Right lower leg, Left lower leg     Bathing assist Assist Level: Moderate Assistance -  Patient 50 - 74%     Upper Body Dressing/Undressing Upper body dressing   What is the patient wearing?: Pull over shirt    Upper body assist Assist Level: Minimal Assistance - Patient > 75%    Lower Body Dressing/Undressing Lower body dressing      What is the patient wearing?: Pants     Lower body assist Assist for lower body dressing: Moderate Assistance - Patient 50 - 74%     Toileting Toileting    Toileting assist Assist for toileting: Maximal Assistance - Patient 25 - 49%     Transfers Chair/bed transfer  Transfers assist  Chair/bed transfer activity did not occur: Safety/medical concerns  Chair/bed transfer assist level: Moderate Assistance - Patient 50 - 74%     Locomotion Ambulation   Ambulation assist   Ambulation activity did not occur: Safety/medical concerns  Assist level: Total Assistance - Patient < 25% Assistive device: Other (comment)(rail in hallway) Max distance: 10'   Walk 10 feet activity   Assist  Walk 10 feet activity did not occur: Safety/medical concerns  Assist level: Total Assistance - Patient < 25% Assistive device: Other (comment)(rail in hallway)   Walk 50 feet activity   Assist Walk 50 feet with 2 turns activity did not occur: Safety/medical concerns         Walk 150 feet activity   Assist Walk 150 feet activity did not occur: Safety/medical concerns         Walk 10 feet on uneven surface  activity  Assist Walk 10 feet on uneven surfaces activity did not occur: Safety/medical concerns         Wheelchair     Assist Will patient use wheelchair at discharge?: Yes Type of Wheelchair: Manual Wheelchair activity did not occur: Safety/medical concerns  Wheelchair assist level: Minimal Assistance - Patient > 75% Max wheelchair distance: 48'    Wheelchair 50 feet with 2 turns activity    Assist    Wheelchair 50 feet with 2 turns activity did not occur: Safety/medical concerns   Assist  Level: Minimal Assistance - Patient > 75%   Wheelchair 150 feet activity     Assist Wheelchair 150 feet activity did not occur: Safety/medical concerns   Assist Level: Minimal Assistance - Patient > 75%      Medical Problem List and Plan: 1.Right greater than left-sided weaknesssecondary to intracerebral hemorrhage with GBM.Surgery was done emergently due to signs of herniation due to notes.Status post left crani for hematoma evacuation and tumor resection 10/13/2018.   Continue CIR PT, OT, SLP--making gains in communication/initiation  PRAFO, WHO at night   -MRI performed Tuesday with inflammatory enhancement of surgical/tumor site. Higher dose steroids initiated   -MRI next month followed XRT and CTX potentially    -Appreciate direction and involvement of Dr. [name] and palliative care 2. Antithrombotics: -DVT/anticoagulation:Subcutaneous heparin initiated 10/16/2018 -antiplatelet therapy: N/A 3. Pain Management:Tylenol as needed 4. Mood:Provide emotional support -antipsychotic agents: N/A 5. Neuropsych: This patientis not of making decisions on hisown behalf. 6. Skin/Wound Care:Routine skin checks 7. Fluids/Electrolytes/Nutrition:intake 100% 8. Dysphagia. Dysphagia #2 thin liquids. 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 Constipation  Bowel meds as necessary 11. Spasticity  Baclofen 5mg  2 times daily started on 10/9---increased to TID 10/13  -tone controlled---no changes to regimen, will continue to monitor 12. Hiccups  Baclofen as needed  Improved 13.  Steroid-induced hyperglycemia  Continue to monitor with steroid wean 14.  Hyponatremia  Sodium 138 10/19  10/26- Na 138  -recheck labs monday 15.  Transaminitis  LFTs   stable-slightly  improved on 10/12 16.  Leukocytosis-  steroid-induced  -10/26- WBC 8k  -recheck labs monday   LOS: 24 days A FACE TO FACE EVALUATION WAS PERFORMED  Meredith Staggers 11/14/2018, 9:33 AM

## 2018-11-14 NOTE — Plan of Care (Signed)
  Problem: Consults Goal: RH BRAIN INJURY PATIENT EDUCATION Description: Description: See Patient Education module for eduction specifics Outcome: Progressing   Problem: RH BOWEL ELIMINATION Goal: RH STG MANAGE BOWEL WITH ASSISTANCE Description: STG Manage Bowel with max Assistance. Outcome: Progressing Goal: RH STG MANAGE BOWEL W/MEDICATION W/ASSISTANCE Description: STG Manage Bowel with Medication with max Assistance. Outcome: Progressing   Problem: RH BLADDER ELIMINATION Goal: RH STG MANAGE BLADDER WITH ASSISTANCE Description: STG Manage Bladder With max Assistance Outcome: Progressing   Problem: RH SKIN INTEGRITY Goal: RH STG SKIN FREE OF INFECTION/BREAKDOWN Outcome: Progressing Goal: RH STG MAINTAIN SKIN INTEGRITY WITH ASSISTANCE Description: STG Maintain Skin Integrity With max Assistance. Outcome: Progressing Goal: RH STG ABLE TO PERFORM INCISION/WOUND CARE W/ASSISTANCE Description: STG Able To Perform Incision/Wound Care With max Assistance. Outcome: Progressing   Problem: RH SAFETY Goal: RH STG ADHERE TO SAFETY PRECAUTIONS W/ASSISTANCE/DEVICE Description: STG Adhere to Safety Precautions With max Assistance/Device. Outcome: Progressing   Problem: RH COGNITION-NURSING Goal: RH STG ANTICIPATES NEEDS/CALLS FOR ASSIST W/ASSIST/CUES Description: STG Anticipates Needs/Calls for Assist With max Assistance/Cues. Outcome: Progressing   Problem: RH PAIN MANAGEMENT Goal: RH STG PAIN MANAGED AT OR BELOW [name] PAIN GOAL Description: Less than 3 out of 10  Outcome: Progressing   Problem: Consults Goal: Diabetes Guidelines if Diabetic/Glucose > 140 Description: If diabetic or lab glucose is > 140 mg/dl - Initiate Diabetes/Hyperglycemia Guidelines & Document Interventions  Outcome: Progressing

## 2018-11-14 NOTE — Progress Notes (Signed)
Occupational Therapy Session Note  Patient Details  Name: Gregory Moreno MRN: [id number] Date of Birth: 11/11/1995  Today's Date: 11/14/2018 OT Individual Time: 1500-1600 OT Individual Time Calculation (min): 60 min    Skilled Therapeutic Interventions/Progress Updates:     1:1. Pt received in w/c reporting need to toilet. Pt completes stand pivot transfer w/c<>BSC with MIN A (to L) and MOD A (to R) with knee block and A to manage RLE during pivot. Pt completes CM in standing with MIN A for balance and MOD A to pull up past hips after toileting. Pt with continent void of urine on toilet. OT and father review SBT with min VC for w/c parts management and VC for father to just guide patient sliding as opposed to lifting pt with buttock clearance. (better second time going back to chair from mat). Pt escorted to outside courtyard for fresh air and mood support. Pt completes w/c mobility over uneven surface in outside courtyard with min-mod A and VC for larger steering with LLE. Pt completes stand pivot transfer and sit to stand from outisde bend with MIN A overall and VC for upright posture and pulling shoulders back. Exited session with tp seated in w/c, call light tin reach and all needs met  Therapy Documentation Precautions:  Precautions Precautions: Fall Precaution Comments: L crani Restrictions Weight Bearing Restrictions: No General:   Vital Signs:  Pain:   ADL: ADL Grooming: Maximal assistance Where Assessed-Grooming: Sitting at sink, Wheelchair Upper Body Bathing: Maximal assistance Where Assessed-Upper Body Bathing: Sitting at sink, Wheelchair Lower Body Bathing: Maximal assistance Where Assessed-Lower Body Bathing: Sitting at sink, Wheelchair Upper Body Dressing: Maximal assistance Where Assessed-Upper Body Dressing: Sitting at sink, Wheelchair Lower Body Dressing: Maximal assistance Where Assessed-Lower Body Dressing: Sitting at sink, Wheelchair Toileting: Unable to  assess Toilet Transfer: Dependent(+2) Armed forces technical officer Method: Charlaine Dalton) Vision   Perception    Praxis   Exercises:   Other Treatments:     Therapy/Group: Individual Therapy  Tonny Branch 11/14/2018, 4:06 PM

## 2018-11-14 NOTE — Progress Notes (Signed)
Occupational Therapy Session Note  Patient Details  Name: Gregory Moreno MRN: [id number] Date of Birth: 1995/01/23  Today's Date: 11/14/2018 OT Individual Time: 1030-1100 OT Individual Time Calculation (min): 30 min    Short Term Goals: Week 4:  OT Short Term Goal 1 (Week 4): Pt will sit EOM during functional activity with min A for 5 mins OT Short Term Goal 2 (Week 4): Pt wash UB with no VC for sequencing to demonstrate R attention OT Short Term Goal 3 (Week 4): Pt will complete SBT to [facility] with min A OT Short Term Goal 4 (Week 4): Pt will complete sit <> stand with mod A for clothing management     Skilled Therapeutic Interventions/Progress Updates:    Pt received in recliner, no c/o pain and dad present in room. Pt completed SBT from recliner to w/c with min A and continues to require A for management of R LE. Pt transported to therapy gym, completed exercises with UE ranger sitting EOM to facilitate AROM and functional movement in R UE. Pt has minimal activation for elbow flexion and shoulder abduction. Exercises with UE ranger focused on shoulder flexion/extension, elbow flexion/extension and should AB/ADuction. Throughout session pt required min VC to correct posterior lean. Exited session with pt in recliner, safety belt on and all needs met.   Therapy Documentation Precautions:  Precautions Precautions: Fall Precaution Comments: L crani Restrictions Weight Bearing Restrictions: No      Therapy/Group: Individual Therapy  Julliana Whitmyer 11/14/2018, 12:42 PM

## 2018-11-15 ENCOUNTER — Inpatient Hospital Stay (HOSPITAL_COMMUNITY): Payer: 59 | Admitting: Physical Therapy

## 2018-11-15 NOTE — Progress Notes (Signed)
Physical Therapy Session Note  Patient Details  Name: Gregory Moreno MRN: [id number] Date of Birth: 1995/09/28  Today's Date: 11/15/2018 PT Individual Time: 0940-1055 PT Individual Time Calculation (min): 75 min   Short Term Goals: Week 4:  PT Short Term Goal 1 (Week 4): Pt will complete bed<>w/c transfers consistently with mod assist +1. PT Short Term Goal 2 (Week 4): Pt will complete car transfer with max assist +1. PT Short Term Goal 3 (Week 4): Pt will consistently propel w/c 100 ft with supervision.  Skilled Therapeutic Interventions/Progress Updates:   Pt received toileting w/ RN, agreeable to therapy and no c/o pain. Pt requesting to shower and don new clothing. Stedy transfer to [facility] in shower. Performed bathing tasks w/ close supervision for static sitting balance and min assist for dynamic balance w/ leaning over to wash feet. Frequent tactile and verbal cues for equal weight distribution in seated and caution to not lean so far forward at trunk for safety. Supervision and verbal/visual cues for bathing everything except back and LUE. Stedy transfer to w/c and donned pants and shirt w/ min assist overall w/ verbal and tactile cues for technique. Total assist for socks and shoes for time management. Set-up assist to brush teeth from seated level at sink. Pt self-propelled w/c all the way to therapy gym, >150', w/ supervision. Close supervision provided 2/2 increased anterior trunk lean, however no LOB. Worked on sit<>stands to high/low table w/ LUE spread flat to decrease L pushing, once therapist provided verbal and tactile cues to push through L side of body, pt able to maintain midline w/o assist. Worked on R quad activation during weight acceptance w/ L forward and backward stepping. Strong R quad contraction felt and pt needed only min assist to keep R knee from buckling this session. During seated rest breaks, pt able to achieve quad activation w/ RLE supported under knee, able to  kick ball 25+ reps. This was the first time (with this therapist) that he was able to achieve R quad activation and to balance flexor and extensor knee musculature to perform this task. Returned to room total assist via w/c, ended session in w/c and in care of pt's mom, all needs met.   Therapy Documentation Precautions:  Precautions Precautions: Fall Precaution Comments: L crani Restrictions Weight Bearing Restrictions: No Pain: Pain Assessment Pain Scale: 0-10 Pain Score: 0-No pain   Therapy/Group: Individual Therapy  Gregory Moreno 11/15/2018, 10:55 AM

## 2018-11-15 NOTE — Progress Notes (Signed)
[place] PHYSICAL MEDICINE & REHABILITATION PROGRESS NOTE  Subjective/Complaints:  Pt reports no issues- sleeping and eating OK   ROS: Limited due to cognitive/language  Objective: Vital Signs: Blood pressure (!) 105/58, pulse 62, temperature 98 F (36.7 C), temperature source Oral, resp. rate 16, height 5\' 7"  (1.702 m), weight 65.3 kg, SpO2 98 %. No results found. No results for input(s): WBC, HGB, HCT, PLT in the last 72 hours. No results for input(s): NA, K, CL, CO2, GLUCOSE, BUN, CREATININE, CALCIUM in the last 72 hours.  Physical Exam: BP (!) 105/58 (BP Location: Left Arm)   Pulse 62   Temp 98 F (36.7 C) (Oral)   Resp 16   Ht 5\' 7"  (1.702 m)   Wt 65.3 kg   SpO2 98%   BMI 22.55 kg/m  Constitutional: No distress . Vital signs reviewed. Lying supine in bed; NAD, just woke up HEENT: EOMI, oral membranes moist Neck: supple Cardiovascular: RRR without murmur. No JVD    Respiratory: CTA Bilaterally without wheezes or rales. Normal effort    GI: BS +, non-tender, non-distended   Skin: Warm and dry.  Intact. Psych: alert and smiling Musc: No edema in extremities.  No tenderness in extremities. Neuro: alert, engages more promptly Motor:  RUE and RLE 0/5, trace resting tone without changes   Assessment/Plan: 1. 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  Physiatrist is providing close team supervision and 24 hour management of active medical problems listed below.  Physiatrist and rehab team continue to assess barriers to discharge/monitor patient progress toward functional and medical goals  Care Tool:  Bathing    Body parts bathed by patient: Right arm, Chest, Front perineal area, Abdomen, Right upper leg, Left upper leg, Face   Body parts bathed by helper: Left arm, Buttocks, Right lower leg, Left lower leg     Bathing assist  Assist Level: Moderate Assistance - Patient 50 - 74%     Upper Body Dressing/Undressing Upper body dressing   What is the patient wearing?: Pull over shirt    Upper body assist Assist Level: Minimal Assistance - Patient > 75%    Lower Body Dressing/Undressing Lower body dressing      What is the patient wearing?: Pants     Lower body assist Assist for lower body dressing: Moderate Assistance - Patient 50 - 74%     Toileting Toileting    Toileting assist Assist for toileting: Maximal Assistance - Patient 25 - 49%     Transfers Chair/bed transfer  Transfers assist  Chair/bed transfer activity did not occur: Safety/medical concerns  Chair/bed transfer assist level: Moderate Assistance - Patient 50 - 74%     Locomotion Ambulation   Ambulation assist   Ambulation activity did not occur: Safety/medical concerns  Assist level: Total Assistance - Patient < 25% Assistive device: Other (comment)(rail in hallway) Max distance: 10'   Walk 10 feet activity   Assist  Walk 10 feet activity did not occur: Safety/medical concerns  Assist level: Total Assistance - Patient < 25% Assistive device: Other (comment)(rail in hallway)   Walk 50 feet activity   Assist Walk 50 feet with 2 turns activity did not occur: Safety/medical concerns         Walk 150 feet activity   Assist Walk 150 feet activity did not occur: Safety/medical concerns         Walk 10  feet on uneven surface  activity   Assist Walk 10 feet on uneven surfaces activity did not occur: Safety/medical concerns         Wheelchair     Assist Will patient use wheelchair at discharge?: Yes Type of Wheelchair: Manual Wheelchair activity did not occur: Safety/medical concerns  Wheelchair assist level: Supervision/Verbal cueing Max wheelchair distance: 150'    Wheelchair 50 feet with 2 turns activity    Assist    Wheelchair 50 feet with 2 turns activity did not occur:  Safety/medical concerns   Assist Level: Supervision/Verbal cueing   Wheelchair 150 feet activity     Assist Wheelchair 150 feet activity did not occur: Safety/medical concerns   Assist Level: Supervision/Verbal cueing      Medical Problem List and Plan: 1.Right greater than left-sided weaknesssecondary to intracerebral hemorrhage with GBM.Surgery was done emergently due to signs of herniation due to notes.Status post left crani for hematoma evacuation and tumor resection 10/13/2018.   Continue CIR PT, OT, SLP--making gains in communication/initiation  PRAFO, WHO at night   -MRI performed Tuesday with inflammatory enhancement of surgical/tumor site. Higher dose steroids initiated   -MRI next month followed XRT and CTX potentially    -Appreciate direction and involvement of Dr. [name] and palliative care 2. Antithrombotics: -DVT/anticoagulation:Subcutaneous heparin initiated 10/16/2018 -antiplatelet therapy: N/A 3. Pain Management:Tylenol as needed 4. Mood:Provide emotional support -antipsychotic agents: N/A 5. Neuropsych: This patientis not of making decisions on hisown behalf. 6. Skin/Wound Care:Routine skin checks 7. Fluids/Electrolytes/Nutrition:intake 100% 8. Dysphagia. Dysphagia #2 thin liquids. 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 Constipation  Bowel meds as necessary 11. Spasticity  Baclofen 5mg  2 times daily started on 10/9---increased to TID 10/13  -tone controlled---no changes to regimen, will continue to monitor 12. Hiccups  Baclofen as needed  Improved 13.  Steroid-induced hyperglycemia  Continue to monitor with steroid wean 14.  Hyponatremia  Sodium 138 10/19  10/26- Na 138  -recheck labs monday 15.  Transaminitis  LFTs    stable-slightly improved on 10/12 16.  Leukocytosis-  steroid-induced  -10/26- WBC 8k  -recheck labs monday   LOS: 25 days A FACE TO FACE EVALUATION WAS PERFORMED  Demontez Novack 11/15/2018, 11:52 AM

## 2018-11-16 ENCOUNTER — Inpatient Hospital Stay (HOSPITAL_COMMUNITY): Payer: 59 | Admitting: Speech Pathology

## 2018-11-16 NOTE — Progress Notes (Signed)
Speech Language Pathology Daily Session Note  Patient Details  Name: NICKALOS SCHOOLER MRN: [id number] Date of Birth: [birth year]  Today's Date: 11/16/2018 SLP Individual Time: 37-1445 SLP Individual Time Calculation (min): 52 min  Short Term Goals: Week 4: SLP Short Term Goal 1 (Week 4): Pt will consume trials of regular diet with minimal overt s/s of aspiration/dysphagia and Min A cues for use of compensatory swallow strategies over 3 sessions prior to upgrade. SLP Short Term Goal 2 (Week 4): Pt will utilize word finding strategies to express wants and needs with Mod A cues. SLP Short Term Goal 3 (Week 4): Pt will demonstrate selective attention to task for ~ 30 minutes with Min A cues. SLP Short Term Goal 4 (Week 4): Pt will complete basic problem solving tasks with Min A cues.  Skilled Therapeutic Interventions:  Pt was seen for skilled ST targeting goals for communication.  Pt was able to identify the appropriate word to complete a phrase when provided with a choice of two for 100% accuracy with supervision; however, he needed mod assist to then read sentences aloud from a written model due to paraphasic errors.  Pt was able to describe actions in photographs with max assist verbal cues and repetition from a clinician provided model to recognize and correct verbal errors.  Pt then named objects from black and white line drawings with mod assist sentence completion cues.  Pt accuracy of verbal expression was noted to improve significantly with multiple repetitions of trials.  [name] dad made several observations regarding pt's communication since pt has become verbal, including pt's difficulty with making novel requests.  SLP provided skilled education regarding rationale for ST interventions and sequelae of language recovery.  All questions were answered to pt's dad's satisfaction at this time.  Pt was left in recliner with family at bedside.  Continue per current plan of care.     Pain Pain  Assessment Pain Scale: 0-10 Pain Score: 0-No pain  Therapy/Group: Individual Therapy  Caileen Veracruz, Selinda Orion 11/16/2018, 3:45 PM

## 2018-11-16 NOTE — Progress Notes (Signed)
[facility] PHYSICAL MEDICINE & REHABILITATION PROGRESS NOTE  Subjective/Complaints:  Pt reports no issues- sleeping and eating OK- wants to watch TV   ROS: Limited due to cognitive/language  Objective: Vital Signs: Blood pressure 120/68, pulse 73, temperature 97.9 F (36.6 C), resp. rate 18, height 5\' 7"  (1.702 m), weight 65.3 kg, SpO2 99 %. No results found. No results for input(s): WBC, HGB, HCT, PLT in the last 72 hours. No results for input(s): NA, K, CL, CO2, GLUCOSE, BUN, CREATININE, CALCIUM in the last 72 hours.  Physical Exam: BP 120/68 (BP Location: Left Arm)   Pulse 73   Temp 97.9 F (36.6 C)   Resp 18   Ht 5\' 7"  (1.702 m)   Wt 65.3 kg   SpO2 99%   BMI 22.55 kg/m  Constitutional: No distress . Vital signs reviewed. Lying supine in bed; NAD,watching TV HEENT: EOMI, oral membranes moist Neck: supple Cardiovascular: RRR without murmur. No JVD    Respiratory: CTA Bilaterally without wheezes or rales. Normal effort    GI: BS +, non-tender, non-distended   Skin: Warm and dry.  Intact. Psych: alert and smiling Musc: No edema in extremities.  No tenderness in extremities. Neuro: alert, engages more promptly Motor:  RUE and RLE 0/5, trace resting tone without changes   Assessment/Plan: 1. 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  Physiatrist is providing close team supervision and 24 hour management of active medical problems listed below.  Physiatrist and rehab team continue to assess barriers to discharge/monitor patient progress toward functional and medical goals  Care Tool:  Bathing    Body parts bathed by patient: Right arm, Chest, Front perineal area, Abdomen, Right upper leg, Left upper leg, Face   Body parts bathed by helper: Left arm, Buttocks, Right lower leg, Left lower leg     Bathing assist Assist Level:  Moderate Assistance - Patient 50 - 74%     Upper Body Dressing/Undressing Upper body dressing   What is the patient wearing?: Pull over shirt    Upper body assist Assist Level: Minimal Assistance - Patient > 75%    Lower Body Dressing/Undressing Lower body dressing      What is the patient wearing?: Pants     Lower body assist Assist for lower body dressing: Moderate Assistance - Patient 50 - 74%     Toileting Toileting    Toileting assist Assist for toileting: Maximal Assistance - Patient 25 - 49%     Transfers Chair/bed transfer  Transfers assist  Chair/bed transfer activity did not occur: Safety/medical concerns  Chair/bed transfer assist level: Moderate Assistance - Patient 50 - 74%     Locomotion Ambulation   Ambulation assist   Ambulation activity did not occur: Safety/medical concerns  Assist level: Total Assistance - Patient < 25% Assistive device: Other (comment)(rail in hallway) Max distance: 10'   Walk 10 feet activity   Assist  Walk 10 feet activity did not occur: Safety/medical concerns  Assist level: Total Assistance - Patient < 25% Assistive device: Other (comment)(rail in hallway)   Walk 50 feet activity   Assist Walk 50 feet with 2 turns activity did not occur: Safety/medical concerns         Walk 150 feet activity   Assist Walk 150 feet activity did not occur: Safety/medical concerns         Walk 10 feet on uneven surface  activity   Assist Walk 10 feet on uneven surfaces activity did not occur: Safety/medical concerns         Wheelchair     Assist Will patient use wheelchair at discharge?: Yes Type of Wheelchair: Manual Wheelchair activity did not occur: Safety/medical concerns  Wheelchair assist level: Supervision/Verbal cueing Max wheelchair distance: 150'    Wheelchair 50 feet with 2 turns activity    Assist    Wheelchair 50 feet with 2 turns activity did not occur: Safety/medical  concerns   Assist Level: Supervision/Verbal cueing   Wheelchair 150 feet activity     Assist Wheelchair 150 feet activity did not occur: Safety/medical concerns   Assist Level: Supervision/Verbal cueing      Medical Problem List and Plan: 1.Right greater than left-sided weaknesssecondary to intracerebral hemorrhage with GBM.Surgery was done emergently due to signs of herniation due to notes.Status post left crani for hematoma evacuation and tumor resection 10/13/2018.   Continue CIR PT, OT, SLP--making gains in communication/initiation  PRAFO, WHO at night   -MRI performed Tuesday with inflammatory enhancement of surgical/tumor site. Higher dose steroids initiated   -MRI next month followed XRT and CTX potentially    -Appreciate direction and involvement of Dr. [name] and palliative care 2. Antithrombotics: -DVT/anticoagulation:Subcutaneous heparin initiated 10/16/2018 -antiplatelet therapy: N/A 3. Pain Management:Tylenol as needed 4. Mood:Provide emotional support -antipsychotic agents: N/A 5. Neuropsych: This patientis not of making decisions on hisown behalf. 6. Skin/Wound Care:Routine skin checks 7. Fluids/Electrolytes/Nutrition:intake 100% 8. Dysphagia. Dysphagia #2 thin liquids. 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 Constipation  Bowel meds as necessary 11. Spasticity  Baclofen 5mg  2 times daily started on 10/9---increased to TID 10/13  -tone controlled---no changes to regimen, will continue to monitor 12. Hiccups  Baclofen as needed  Improved 13.  Steroid-induced hyperglycemia  Continue to monitor with steroid wean 14.  Hyponatremia  Sodium 138 10/19  10/26- Na 138  -recheck labs monday 15.  Transaminitis  LFTs   stable-slightly  improved on 10/12 16.  Leukocytosis-  steroid-induced  -10/26- WBC 8k  -recheck labs monday   LOS: 26 days A FACE TO FACE EVALUATION WAS PERFORMED  Maygan Koeller 11/16/2018, 1:05 PM

## 2018-11-17 ENCOUNTER — Inpatient Hospital Stay (HOSPITAL_COMMUNITY): Payer: 59

## 2018-11-17 ENCOUNTER — Inpatient Hospital Stay (HOSPITAL_COMMUNITY): Payer: 59 | Admitting: Speech Pathology

## 2018-11-17 ENCOUNTER — Inpatient Hospital Stay (HOSPITAL_COMMUNITY): Payer: 59 | Admitting: Occupational Therapy

## 2018-11-17 ENCOUNTER — Inpatient Hospital Stay (HOSPITAL_COMMUNITY): Payer: 59 | Admitting: Physical Therapy

## 2018-11-17 LAB — BASIC METABOLIC PANEL
Anion gap: 12 (ref 5–15)
BUN: 17 mg/dL (ref 6–20)
CO2: 24 mmol/L (ref 22–32)
Calcium: 9.2 mg/dL (ref 8.9–10.3)
Chloride: 99 mmol/L (ref 98–111)
Creatinine, Ser: 0.77 mg/dL (ref 0.61–1.24)
GFR calc Af Amer: >60 mL/min (ref >60)
GFR calc non Af Amer: >60 mL/min (ref >60)
Glucose, Bld: 99 mg/dL (ref 70–99)
Potassium: 4 mmol/L (ref 3.5–5.1)
Sodium: 135 mmol/L (ref 135–145)

## 2018-11-17 LAB — CBC
HCT: 37.9 % — ABNORMAL LOW (ref 39.0–52.0)
Hemoglobin: 12.4 g/dL — ABNORMAL LOW (ref 13.0–17.0)
MCH: 29.1 pg (ref 26.0–34.0)
MCHC: 32.7 g/dL (ref 30.0–36.0)
MCV: 89 fL (ref 80.0–100.0)
Platelets: 252 10*3/uL (ref 150–400)
RBC: 4.26 MIL/uL (ref 4.22–5.81)
RDW: 13.3 % (ref 11.5–15.5)
WBC: 13.9 10*3/uL — ABNORMAL HIGH (ref 4.0–10.5)
nRBC: 0 % (ref 0.0–0.2)

## 2018-11-17 NOTE — Plan of Care (Signed)
  Problem: Consults Goal: RH BRAIN INJURY PATIENT EDUCATION Description: Description: See Patient Education module for eduction specifics Outcome: Progressing   Problem: RH BOWEL ELIMINATION Goal: RH STG MANAGE BOWEL WITH ASSISTANCE Description: STG Manage Bowel with max Assistance. Outcome: Progressing Goal: RH STG MANAGE BOWEL W/MEDICATION W/ASSISTANCE Description: STG Manage Bowel with Medication with max Assistance. Outcome: Progressing   Problem: RH BLADDER ELIMINATION Goal: RH STG MANAGE BLADDER WITH ASSISTANCE Description: STG Manage Bladder With max Assistance Outcome: Progressing   Problem: RH SKIN INTEGRITY Goal: RH STG SKIN FREE OF INFECTION/BREAKDOWN Outcome: Progressing Goal: RH STG MAINTAIN SKIN INTEGRITY WITH ASSISTANCE Description: STG Maintain Skin Integrity With max Assistance. Outcome: Progressing Goal: RH STG ABLE TO PERFORM INCISION/WOUND CARE W/ASSISTANCE Description: STG Able To Perform Incision/Wound Care With max Assistance. Outcome: Progressing   Problem: RH SAFETY Goal: RH STG ADHERE TO SAFETY PRECAUTIONS W/ASSISTANCE/DEVICE Description: STG Adhere to Safety Precautions With max Assistance/Device. Outcome: Progressing   Problem: RH COGNITION-NURSING Goal: RH STG ANTICIPATES NEEDS/CALLS FOR ASSIST W/ASSIST/CUES Description: STG Anticipates Needs/Calls for Assist With max Assistance/Cues. Outcome: Progressing   Problem: RH PAIN MANAGEMENT Goal: RH STG PAIN MANAGED AT OR BELOW [name] PAIN GOAL Description: Less than 3 out of 10  Outcome: Progressing   Problem: RH KNOWLEDGE DEFICIT BRAIN INJURY Goal: RH STG INCREASE KNOWLEDGE OF SELF CARE AFTER BRAIN INJURY Outcome: Progressing Goal: RH STG INCREASE KNOWLEDGE OF DYSPHAGIA/FLUID INTAKE Outcome: Progressing   Problem: Consults Goal: Diabetes Guidelines if Diabetic/Glucose > 140 Description: If diabetic or lab glucose is > 140 mg/dl - Initiate Diabetes/Hyperglycemia Guidelines & Document  Interventions  Outcome: Progressing

## 2018-11-17 NOTE — Progress Notes (Signed)
[facility] PHYSICAL MEDICINE & REHABILITATION PROGRESS NOTE  Subjective/Complaints:  Up in bed watching news. No new complaints.   ROS: limited due to language/communication    Objective: Vital Signs: Blood pressure 115/69, pulse 75, temperature 97.9 F (36.6 C), temperature source Oral, resp. rate 16, height 5\' 7"  (1.702 m), weight 65.3 kg, SpO2 99 %. No results found. Recent Labs    11/17/18 0500  WBC 13.9*  HGB 12.4*  HCT 37.9*  PLT 252   Recent Labs    11/17/18 0500  NA 135  K 4.0  CL 99  CO2 24  GLUCOSE 99  BUN 17  CREATININE 0.77  CALCIUM 9.2    Physical Exam: BP 115/69 (BP Location: Right Arm)   Pulse 75   Temp 97.9 F (36.6 C) (Oral)   Resp 16   Ht 5\' 7"  (1.702 m)   Wt 65.3 kg   SpO2 99%   BMI 22.55 kg/m  Constitutional: No distress . Vital signs reviewed. HEENT: EOMI, oral membranes moist Neck: supple Cardiovascular: RRR without murmur. No JVD    Respiratory: CTA Bilaterally without wheezes or rales. Normal effort    GI: BS +, non-tender, non-distended  Skin: Warm and dry.  Intact. Psych: alert and smiling Musc: No edema in extremities.  No tenderness in extremities. Neuro: alert, engages quickly. Answers basic questions typically with appropriate answers although insight limited. Motor:  RUE and RLE 0/5, o resting tone today  Assessment/Plan: 1. 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  Physiatrist is providing close team supervision and 24 hour management of active medical problems listed below.  Physiatrist and rehab team continue to assess barriers to discharge/monitor patient progress toward functional and medical goals  Care Tool:  Bathing    Body parts bathed by patient: Right arm, Chest, Front perineal area, Abdomen, Right upper leg, Left upper leg, Face   Body parts bathed by helper: Left  arm, Buttocks, Right lower leg, Left lower leg     Bathing assist Assist Level: Moderate Assistance - Patient 50 - 74%     Upper Body Dressing/Undressing Upper body dressing   What is the patient wearing?: Pull over shirt    Upper body assist Assist Level: Minimal Assistance - Patient > 75%    Lower Body Dressing/Undressing Lower body dressing      What is the patient wearing?: Pants     Lower body assist Assist for lower body dressing: Moderate Assistance - Patient 50 - 74%     Toileting Toileting    Toileting assist Assist for toileting: Maximal Assistance - Patient 25 - 49%     Transfers Chair/bed transfer  Transfers assist  Chair/bed transfer activity did not occur: Safety/medical concerns  Chair/bed transfer assist level: Moderate Assistance - Patient 50 - 74%     Locomotion Ambulation   Ambulation assist   Ambulation activity did not occur: Safety/medical concerns  Assist level: Total Assistance - Patient < 25% Assistive device: Other (comment)(rail in hallway) Max distance: 10'   Walk 10 feet activity   Assist  Walk 10 feet activity did not occur: Safety/medical concerns  Assist level: Total Assistance - Patient < 25% Assistive device: Other (comment)(rail in hallway)   Walk 50 feet activity   Assist Walk 50 feet with 2 turns activity did not occur: Safety/medical concerns         Walk 150 feet activity  Assist Walk 150 feet activity did not occur: Safety/medical concerns         Walk 10 feet on uneven surface  activity   Assist Walk 10 feet on uneven surfaces activity did not occur: Safety/medical concerns         Wheelchair     Assist Will patient use wheelchair at discharge?: Yes Type of Wheelchair: Manual Wheelchair activity did not occur: Safety/medical concerns  Wheelchair assist level: Supervision/Verbal cueing Max wheelchair distance: 150'    Wheelchair 50 feet with 2 turns activity    Assist     Wheelchair 50 feet with 2 turns activity did not occur: Safety/medical concerns   Assist Level: Supervision/Verbal cueing   Wheelchair 150 feet activity     Assist Wheelchair 150 feet activity did not occur: Safety/medical concerns   Assist Level: Supervision/Verbal cueing      Medical Problem List and Plan: 1.Right greater than left-sided weaknesssecondary to intracerebral hemorrhage with GBM.Surgery was done emergently due to signs of herniation due to notes.Status post left crani for hematoma evacuation and tumor resection 10/13/2018.   Continue CIR PT, OT, SLP--making gains in communication/initiation  PRAFO, WHO at night   -MRI performed Tuesday with inflammatory enhancement of surgical/tumor site. Higher dose steroids initiated. Pt tolerating   -MRI later this month followed XRT and CTX potentially    -  -ongoing direction and involvement of Dr. [name] and palliative care 2. Antithrombotics: -DVT/anticoagulation:Subcutaneous heparin initiated 10/16/2018 -antiplatelet therapy: N/A 3. Pain Management:Tylenol as needed 4. Mood:Provide emotional support -antipsychotic agents: N/A 5. Neuropsych: This patientis not of making decisions on hisown behalf. 6. Skin/Wound Care:Routine skin checks 7. Fluids/Electrolytes/Nutrition:intake 100% 8. Dysphagia. Dysphagia #2 thin liquids. 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 Constipation  Bowel meds as necessary 11. Spasticity  Baclofen 5mg  2 times daily started on 10/9---increased to TID 10/13  -tone controlled with this regimen 12. Hiccups  Baclofen as needed  Improved 13.  Steroid-induced hyperglycemia  Continue to monitor with steroid wean 14.  Hyponatremia  Sodium 135 11/2 15.  Transaminitis  LFTs    stable-slightly improved on 10/12 16.  Leukocytosis-  steroid-induced  -10/26- WBC 8k--> 13.9 11/2 as expected   LOS: 27 days A FACE TO FACE EVALUATION WAS PERFORMED  Meredith Staggers 11/17/2018, 8:49 AM

## 2018-11-17 NOTE — Progress Notes (Signed)
Physical Therapy Session Note  Patient Details  Name: Gregory Moreno MRN: [id number] Date of Birth: [birth year]  Today's Date: 11/17/2018 PT Individual Time: 0847-1000 PT Individual Time Calculation (min): 73 min   Short Term Goals: Week 4:  PT Short Term Goal 1 (Week 4): Pt will complete bed<>w/c transfers consistently with mod assist +1. PT Short Term Goal 2 (Week 4): Pt will complete car transfer with max assist +1. PT Short Term Goal 3 (Week 4): Pt will consistently propel w/c 100 ft with supervision.  Skilled Therapeutic Interventions/Progress Updates:  Pt received in bed & agreeable to tx. Pt performs rolling R with mod assist and cuing for technique & UE/LE placement, pt is able to transfer R sidelying>sitting with min assist with cuing for LUE hand placement to assist with task. Therapist provides assistance for donning ted hose, shoes, & pants for time management. Pt is able to demonstrate static sitting balance EOB with CGA<>min assist with cuing to correct slight R lateral lean. Pt transfers bed>w/c and w/c>mat table via stand pivot with mod assist with much improvement in ability to power up to standing. Pt propels w/c room>nurses station with close supervision with pt demonstrating significant anterior lean and LLE underneath w/c to assist with steering despite cuing for neutral postural alignment and to place LUE out in front of w/c. In dayroom, pt transferred onto mat & was assisted to prone x 5 minutes for hip flexor stretch with therapist providing slight overpressure at pelvis for more hip extension and reduce anterior pelvic tilt. Pt then requires total assist +2 to transfer to tall kneeling position with use of mirror for feedback for upright posture but pt unable to tolerate position for more than ~2 minutes 2/2 R knee pain despite therapist repositioning so pt assisted out of tall kneeling & pt reports pain is gone. Provided cuing & assistance for BLE placement for pt to scoot to  R to edge of mat with pt able to do so with mod assist at shoulders. Therapist donned R GRAFO & from sitting EOM pt transferred to standing & ambulated 5 ft + 20 ft + 20 ft with +2 assist via three muskateers style with therapist assisting with advancing & placing RLE, then blocking R knee to prevent A/P instability with pt unable to activate quads to extend knee in stance phase & R knee hyperextending. During gait, pt will demonstrate periods of "getting stuck" and he is unable to advance LLE despite multimodal cuing and therapist providing anterior weight shifting, ultimately requiring assistance to initiate stepping with LLE. However, once pt is able to demonstrate automatic gait pattern some slight activation & initiating advancement of RLE is noted. At end of session pt left in w/c in room with mother & NT present.  Therapy Documentation Precautions:  Precautions Precautions: Fall Precaution Comments: L crani Restrictions Weight Bearing Restrictions: No    Therapy/Group: Individual Therapy  [name] 11/17/2018, 12:39 PM

## 2018-11-17 NOTE — Progress Notes (Signed)
Occupational Therapy Session Note  Patient Details  Name: Gregory Moreno MRN: [id number] Date of Birth: 08/25/1995  Today's Date: 11/17/2018 OT Individual Time: 1102-1205 OT Individual Time Calculation (min): 63 min   Skilled Therapeutic Interventions/Progress Updates:    Patient seated in w/c finishing SLP session.  Pleasant and cooperative.  Able to express basic wants and needs.  Sit pivot transfer to/from mat, w/c with min/mod A and tactile cues for use of right side.  Unsupported sitting on mat table with CS, cues for midline, anterior pelvic tilt and lateral shift (tends to roll onto right hip).  Patient tends to respond "I can't" with attempts to move right UE.   In supine position able to facilitate full elbow extension, ER, IR, shoulder flexion and extension, wrist flexion/extension - inconsistent ability to maintain and reproduce.  Weight bearing activities in [place], completed shoulder and whole arm mobility activities in seated position with fair response.  SSP to/from supine on mat for various activities with min/mod A, min cues for technique and use of right side.  Patient returned to room, seated in w/c, reviewed session with mom, seat belt alarm set and call bell in reach.    Therapy Documentation Precautions:  Precautions Precautions: Fall Precaution Comments: L crani Restrictions Weight Bearing Restrictions: No General:   Vital Signs:   Pain: Pain Assessment Pain Scale: 0-10 Pain Score: 0-No pain  Therapy/Group: Individual Therapy  Carlos Levering 11/17/2018, 12:22 PM

## 2018-11-17 NOTE — Progress Notes (Signed)
Occupational Therapy Session Note  Patient Details  Name: TYRANCE BRANIGAN MRN: [id number] Date of Birth: [birth year]  Today's Date: 11/17/2018 OT Individual Time: 1445-1536 OT Individual Time Calculation (min): 51 min     Skilled Therapeutic Interventions/Progress Updates:    patient seated in recliner, ready for therapy session.  Mom present for session today.  SPT to/from recliner and w/c with mod A.  Sit pivot to/from w/c and mat table with min A - cues for weight shift.  Completed supine, seated and standing NMRE with focus on facilitation of deltoid, ER, elbow ext, wrist ext and proximal control in various positions, weight bearing and occ need for inhibition at elbow flex, IR.  Hand paddle used for improved contact with various surfaces in each position.  Sit to stand with mod A - able to maintain standing and complete weight shifts with focus on right knee control max A.  Patient seated in recliner at close of session, seat belt alarm set and mom present, call bell in reach.    Therapy Documentation Precautions:  Precautions Precautions: Fall Precaution Comments: L crani Restrictions Weight Bearing Restrictions: No General:   Vital Signs: Therapy Vitals Temp: 97.8 F (36.6 C) Pulse Rate: 80 Resp: 19 BP: 122/76 Patient Position (if appropriate): Sitting Oxygen Therapy SpO2: 99 % O2 Device: Room Air Pain: Pain Assessment Pain Scale: 0-10 Pain Score: 0-No pain   Therapy/Group: Individual Therapy  Carlos Levering 11/17/2018, 3:54 PM

## 2018-11-17 NOTE — Progress Notes (Signed)
Speech Language Pathology Daily Session Note  Patient Details  Name: Gregory Moreno MRN: [id number] Date of Birth: 04-14-1995  Today's Date: 11/17/2018  Session 1: SLP Individual Time: 0725-0800 SLP Individual Time Calculation (min): 35 min   Session 2: SLP Individual Time: 1030-1100 SLP Individual Time Calculation (min): 30 min  Short Term Goals: Week 4: SLP Short Term Goal 1 (Week 4): Pt will consume trials of regular diet with minimal overt s/s of aspiration/dysphagia and Min A cues for use of compensatory swallow strategies over 3 sessions prior to upgrade. SLP Short Term Goal 2 (Week 4): Pt will utilize word finding strategies to express wants and needs with Mod A cues. SLP Short Term Goal 3 (Week 4): Pt will demonstrate selective attention to task for ~ 30 minutes with Min A cues. SLP Short Term Goal 4 (Week 4): Pt will complete basic problem solving tasks with Min A cues.  Skilled Therapeutic Interventions:  Session 1: Skilled treatment session focused on dysphagia and cognitive goals. SLP facilitated session by providing Min A verbal cues for basic problem solving during tray set-up with breakfast meal of regular textures with thin liquids. Patient consumed meal without overt s/s of aspiration but required Min verbal cues for use of small bites/sips. Recommend patient continue current diet. Patient left upright in bed with alarm on and all needs within reach. Continue with current plan of care.   Session 2: Skilled treatment session focused on language goals. SLP facilitated session by initiating administration of the Reading Comprehension Battery for Aphasia (RCBA). Patient demonstrated 100% accuracy while reading single words with auditory confusion but only 70% accuracy while reading single words with visual confusion and semantic confusion. Administration of test will continue during next session. Patient handed off to OT. Continue with current plan of care.   Pain Pain  Assessment Pain Scale: 0-10 Pain Score: 0-No pain  Therapy/Group: Individual Therapy  Alf Doyle 11/17/2018, 12:57 PM

## 2018-11-18 ENCOUNTER — Inpatient Hospital Stay (HOSPITAL_COMMUNITY): Payer: 59 | Admitting: Physical Therapy

## 2018-11-18 ENCOUNTER — Inpatient Hospital Stay (HOSPITAL_COMMUNITY): Payer: 59

## 2018-11-18 ENCOUNTER — Inpatient Hospital Stay (HOSPITAL_COMMUNITY): Payer: 59 | Admitting: Speech Pathology

## 2018-11-18 DIAGNOSIS — C711 Malignant neoplasm of frontal lobe: Secondary | ICD-10-CM

## 2018-11-18 MED ORDER — BACLOFEN 5 MG HALF TABLET
5.0000 mg | ORAL_TABLET | Freq: Three times a day (TID) | ORAL | Status: DC | PRN
  Administered 2018-11-19 – 2018-11-27 (×3): 5 mg via ORAL
  Filled 2018-11-18 (×2): qty 1

## 2018-11-18 NOTE — Progress Notes (Addendum)
Physical Therapy Weekly Progress Note  Patient Details  Name: Gregory Moreno MRN: [id number] Date of Birth: 01-14-1996  Beginning of progress report period: November 12, 2018 End of progress report period: November 18, 2018  Today's Date: 11/18/2018 PT Individual Time: 7408-1448 PT Individual Time Calculation (min): 60 min   Patient has met 3 of 3 short term goals.  Pt is making good progress towards LTGs. Pt can currently complete stand pivot transfers with mod assist, except max assist w/c<>car at sedan simulated height. On this date, pt was able to ambulate up to 31 ft with hemiwalker, R GRAFO & heel wedge, and mod/max assist + w/c follow for safety. Pt is demonstrating some slight improved activation in RUE & RLE. Caregiver training has also been initiated but pt's mother & father would benefit from continued hands on training to ensure safety for pt & them. Pt would benefit from continued skilled PT treatment to focus on transfers, gait, standing balance, R NMR, awareness & endurance.  Patient continues to demonstrate the following deficits muscle weakness, decreased cardiorespiratoy endurance, motor apraxia, decreased coordination and decreased motor planning, decreased visual perceptual skills, decreased attention to right, decreased initiation, decreased attention, decreased awareness, decreased problem solving, decreased safety awareness, decreased memory and delayed processing, and decreased sitting balance, decreased standing balance, decreased postural control, decreased balance strategies and R hemiparesis and therefore will continue to benefit from skilled PT intervention to increase functional independence with mobility.  Patient progressing toward long term goals..  Continue plan of care.  PT Short Term Goals Week 4:  PT Short Term Goal 1 (Week 4): Pt will complete bed<>w/c transfers consistently with mod assist +1. PT Short Term Goal 1 - Progress (Week 4): Met PT Short Term Goal  2 (Week 4): Pt will complete car transfer with max assist +1. PT Short Term Goal 2 - Progress (Week 4): Met PT Short Term Goal 3 (Week 4): Pt will consistently propel w/c 100 ft with supervision. PT Short Term Goal 3 - Progress (Week 4): Met Week 5:  PT Short Term Goal 1 (Week 5): STG = LTG due to estimated d/c date.  Skilled Therapeutic Interventions/Progress Updates:  Pt received in bed & agreeable to tx. Therapist provide assistance with donning ted hose, socks & shoes total assist, & threading pants on BLE with pt able to pull pants over L hips at bed level. Pt requires multimodal cuing for rolling R & min assist, and pt able to upright himself from sidelying to sitting with min assist with extra time and cuing for LUE placement. Pt transfers bed>w/c with mod assist via stand pivot. Transported pt to gym via w/c dependent assist for time management. Pt completes car transfer at SUV simulated height with max assist via stand pivot with cuing for overall technique & assistance with placing RLE in/out of car. Therapist donned R GRAFO & heel wedge & RUE Giv Mohr sling. Pt transfers sit>stand with min assist with cuing for hand placement. Pt ambulates 30 ft + 63 ft with hemiwalker & mod/max assist + w/c follow for safety with improvement in upright posture but pt still requiring max cuing for forward gaze vs downward gaze at feet. Pt with much more fluid gait on this date with no instances of getting "stuck" during gait & pt able to follow cuing for proper gait pattern with hemiwalker with pt able to progress to very few occasional cuing for gait pattern. Pt demonstrates very slight occasional ability to activate RLE for step  but pt requires max/total assist for RLE advancement & placement & still some hyperextension noted in RLE during stance phase despite use of heel wedge. Pt also with improved weight shifting and ability to step with LLE. At end of session pt left in w/c with chair alarm donned, call bell  in reach, & dad present in room to supervise.  Addendum: Pt very excited & able to slightly flex fingers on RUE during session.  Therapy Documentation Precautions:  Precautions Precautions: Fall Precaution Comments: L crani Restrictions Weight Bearing Restrictions: No  Pain: No behaviors demonstrating pain during session.  Therapy/Group: Individual Therapy  [name] 11/18/2018, 10:41 AM

## 2018-11-18 NOTE — Progress Notes (Signed)
Occupational Therapy Session Note  Patient Details  Name: Gregory Moreno MRN: [id number] Date of Birth: Dec 28, 1995  Today's Date: 11/18/2018 OT Individual Time: 8119-1478 OT Individual Time Calculation (min): 32 min   Session 2: OT Individual Time: 1530-1640 OT Individual Time Calculation (min): 70 min    Short Term Goals: Week 4:  OT Short Term Goal 1 (Week 4): Pt will sit EOM during functional activity with min A for 5 mins OT Short Term Goal 2 (Week 4): Pt wash UB with no VC for sequencing to demonstrate R attention OT Short Term Goal 3 (Week 4): Pt will complete SBT to [facility] with min A OT Short Term Goal 4 (Week 4): Pt will complete sit <> stand with mod A for clothing management  Skilled Therapeutic Interventions/Progress Updates:    Pt received sitting up in the w/c with no c/o pain or need for bathroom. Pt was transported to the therapy gym with his father present for ongoing family edu. Session focused on progression of functional mobility with carryover to ADL transfers in the home. Pt completed first set of 10 ft of functional mobility with R AFO donned, total A for time management. Pt used hemi walker and Givmohr sling donned to support glenohumeral muscular and ligamentous structure. Edu provided to pt and pt's father re importance of supporting RUE during standing and functional mobility. Pt required min A during LLE swing stance and mod A and max facilitation during RLE swing stance. Pt completed another 30 ft of functional mobility with similar assist needed. Pt was returned to his room and left sitting up with all needs met, chair alarm set.   Session 2: Pt received sitting up in w/c with no c/o pain, father present. Pt agreeable to shower. Pt completed functional mobility into bathroom with mod A, requiring max facilitation of RLE for swing through. Pt sat on BSC in walk in shower. Pt required mod cueing for doffing of clothing and technique. Pt required intermittent min A for  dynamic sitting balance support with 2 LOB toward the R side when reaching distally. Pt required mod cueing for midline orientation with R lateral lean and weight shift. HOH provided for RUE inclusion during bathing, min A overall to wash thoroughly. Pt able to stand and wash anterior/posterior peri areas with mod balance support. Pt returned to w/c and donned his shirt with CGA. Mod A to don pants overall. Edu provided re shower stall transfer and demo provided to pt's father with use of TTB. Pt was returned to his room and left sitting up with all needs met, chair alarm set.   Therapy Documentation Precautions:  Precautions Precautions: Fall Precaution Comments: L crani Restrictions Weight Bearing Restrictions: No   Therapy/Group: Individual Therapy  [name] 11/18/2018, 12:10 PM

## 2018-11-18 NOTE — Progress Notes (Signed)
Speech Language Pathology Daily Session Note  Patient Details  Name: Gregory Moreno MRN: [id number] Date of Birth: 1995/06/07  Today's Date: 11/18/2018 SLP Individual Time: 0700-0755 SLP Individual Time Calculation (min): 55 min  Short Term Goals: Week 4: SLP Short Term Goal 1 (Week 4): Pt will consume trials of regular diet with minimal overt s/s of aspiration/dysphagia and Min A cues for use of compensatory swallow strategies over 3 sessions prior to upgrade. SLP Short Term Goal 2 (Week 4): Pt will utilize word finding strategies to express wants and needs with Mod A cues. SLP Short Term Goal 3 (Week 4): Pt will demonstrate selective attention to task for ~ 30 minutes with Min A cues. SLP Short Term Goal 4 (Week 4): Pt will complete basic problem solving tasks with Min A cues.  Skilled Therapeutic Interventions: Skilled treatment session focused on dysphagia and cognitive-linguistic goals. SLP facilitated session by providing extra time and Min A verbal cues for tray set-up with breakfast meal of regular textures with thin liquids. Patient consumed meal without overt s/s of aspiration but requires supervision level verbal cues for use of swallowing compensatory strategies. Patient with increased automatic verbal responses this session with extra time and Mod A verbal cues needed to self-monitor and correct verbal errors. SLP also facilitated session by continuing assessment of patinet's reading comprehension with the Reading Comprehension Battery for Aphasia (RCBA). Patient demonstrated 20% accuracy with synonyms at the word level and 30% accuracy with functional reading and sentence comprehension with patient demonstrating emergent awareness in regards to difficulty of tasks. Patient left upright in bed with alarm on and all needs within reach. Continue with current plan of care.      Pain Pain Assessment Pain Scale: 0-10 Pain Score: 0-No pain  Therapy/Group: Individual Therapy  Colby Catanese,  Takia Runyon 11/18/2018, 9:59 AM

## 2018-11-18 NOTE — Patient Care Conference (Signed)
Inpatient RehabilitationTeam Conference and Plan of Care Update Date: 11/18/2018   Time: 10:10 AM   Patient Name: Gregory Moreno      Medical Record Number: [id number]  Date of Birth: Apr 04, 1995 Sex: Male         Room/Bed: 4W01C/4W01C-01 Payor Info: Payor: Theme park manager / Plan: Theme park manager OTHER / Product Type: *No Product type* /    Admit Date/Time:  10/21/2018  4:11 PM  Primary Diagnosis:  Intracranial tumor ([place])  Patient Active Problem List   Diagnosis Date Noted  . Palliative care by specialist   . 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 Spastic hemiparesis ([place])   . Cerebral edema ([facility])   . Intracranial tumor ([place])   . Steroid-induced hyperglycemia   . Spastic hemiplegia affecting nondominant side ([place])   . Hyponatremia   . Transaminitis   . Leucocytosis   . Right spastic hemiplegia ([place]) 10/21/2018  . Dysphagia 10/21/2018  . 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 Brain mass   . ICH (intracerebral hemorrhage) ([place]) 10/13/2018    Expected Discharge Date: Expected Discharge Date: 11/28/18  Team Members Present: Physician leading conference: Dr. Alger Simons Social Worker Present: Lennart Pall, LCSW Nurse Present: Serena Croissant, LPN Case Manager: Karene Fry, RN PT Present: Burnard Bunting, PT OT Present: Laverle Hobby, OT SLP Present: Weston Anna, SLP PPS Coordinator present : Ileana Ladd, Burna Mortimer, SLP     Current Status/Progress Goal Weekly Team Focus  Bowel/Bladder   Incontinent of B/B LBM 11/16/18  regain function/continence  Timed toileting and prn bowel medications   Swallow/Nutrition/ Hydration   Regular textures with thin liquids, Supervision  Supervision  increased use of swallowing strategies   ADL's   CGA UB dressing, mod A LB dressing, mod A functional mobility, min A transfers, activation in all RUE joints  upgraded to min A  RUE NMR, family education, ADL retraining, standing balance   Mobility   mod assist  +1 stand pivot transfers, supervision w/c mobility, mod assist bed mobility, gait for a max of 60 ft with mod/max assist + w/c follow with hemi walker  upgraded to min/mod assist overall  sitting/standing balance, trunk control, awareness, w/c mobility, transfers, bed mobility, gait, R NMR   Communication   Mod A  Max A  increase vocal intensity/intelligibility, communication at the phrase level, reading comprehension   Safety/Cognition/ Behavioral Observations  Mod A  Min A  problem solving and recall   Pain   No complaints  remain pain free  assess pain q shift and prn   Skin   Clean, dry, intact  free of infection skin remain intact  Assess skin q shift and prn    Rehab Goals Patient on target to meet rehab goals: Yes *See Care Plan and progress notes for long and short-term goals.     Barriers to Discharge  Current Status/Progress Possible Resolutions Date Resolved   Nursing                  PT                    OT                  SLP                SW                Discharge Planning/Teaching Needs:  Home with parents to provide 24/7  assistance.  Parents here daily;  formal teaching to be completed closer to d/c.   Team Discussion: Making general progress, speech/cognition deficits, tone controlled, onc to repeat MRI - then decide on timing of chemo/radiation, started back on steroids by Onc.  Incontinent B/B.  OT min UB B/D, Mod LB B/D, min/mod stand pivot, goals im for transfers/mod A for toileting.  PT making progress, amb 6' min/mod A hemi walker with R AFO, goals are min/mod overall.  Fam ed with both parents ongoing.  SLP reg diet, thin liquids, tol well with set up assist, cues to take small bites, talking more, verbal errors, working on awareness, reading/comprehension.   Revisions to Treatment Plan: N/A     Medical Summary Current Status: improved language, initiation. on high dose steroids d/t inflammatory response at tumor/surgery site Weekly Focus/Goal:  rad/onc plan. tone control.  Barriers to Discharge: Medical stability       Continued Need for Acute Rehabilitation Level of Care: The patient requires daily medical management by a physician with specialized training in physical medicine and rehabilitation for the following reasons: Direction of a multidisciplinary physical rehabilitation program to maximize functional independence : Yes Medical management of patient stability for increased activity during participation in an intensive rehabilitation regime.: Yes Analysis of laboratory values and/or radiology reports with any subsequent need for medication adjustment and/or medical intervention. : Yes   I attest that I was present, lead the team conference, and concur with the assessment and plan of the team.   Jodell Cipro M 11/20/2018, 4:06 PM  Team conference was held via web/ teleconference due to [place] - 19

## 2018-11-18 NOTE — Progress Notes (Signed)
[facility] PHYSICAL MEDICINE & REHABILITATION PROGRESS NOTE  Subjective/Complaints:  Watching [employer] news. No new complaints  ROS: Limited due to cognitive/behavioral   Objective: Vital Signs: Blood pressure (!) 109/58, pulse 64, temperature 98 F (36.7 C), temperature source Oral, resp. rate 16, height 5\' 7"  (1.702 m), weight 65.3 kg, SpO2 99 %. No results found. Recent Labs    11/17/18 0500  WBC 13.9*  HGB 12.4*  HCT 37.9*  PLT 252   Recent Labs    11/17/18 0500  NA 135  K 4.0  CL 99  CO2 24  GLUCOSE 99  BUN 17  CREATININE 0.77  CALCIUM 9.2    Physical Exam: BP (!) 109/58 (BP Location: Left Arm)   Pulse 64   Temp 98 F (36.7 C) (Oral)   Resp 16   Ht 5\' 7"  (1.702 m)   Wt 65.3 kg   SpO2 99%   BMI 22.55 kg/m  Constitutional: No distress . Vital signs reviewed. HEENT: EOMI, oral membranes moist Neck: supple Cardiovascular: RRR without murmur. No JVD    Respiratory: CTA Bilaterally without wheezes or rales. Normal effort    GI: BS +, non-tender, non-distended  Skin: Warm and dry.  Intact. Psych: alert and smiling Musc: No edema in extremities.  No tenderness in extremities. Neuro: alert and continues to engage more in conversation. Struggles with more complex information Motor:  RUE and RLE 0/5, no resting tone today  Assessment/Plan: 1. 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  Physiatrist is providing close team supervision and 24 hour management of active medical problems listed below.  Physiatrist and rehab team continue to assess barriers to discharge/monitor patient progress toward functional and medical goals  Care Tool:  Bathing    Body parts bathed by patient: Right arm, Chest, Front perineal area, Abdomen, Right upper leg, Left upper leg, Face   Body parts bathed by helper: Left arm, Buttocks, Right lower  leg, Left lower leg     Bathing assist Assist Level: Moderate Assistance - Patient 50 - 74%     Upper Body Dressing/Undressing Upper body dressing   What is the patient wearing?: Pull over shirt    Upper body assist Assist Level: Minimal Assistance - Patient > 75%    Lower Body Dressing/Undressing Lower body dressing      What is the patient wearing?: Pants     Lower body assist Assist for lower body dressing: Moderate Assistance - Patient 50 - 74%     Toileting Toileting    Toileting assist Assist for toileting: Maximal Assistance - Patient 25 - 49%     Transfers Chair/bed transfer  Transfers assist  Chair/bed transfer activity did not occur: Safety/medical concerns  Chair/bed transfer assist level: Moderate Assistance - Patient 50 - 74%     Locomotion Ambulation   Ambulation assist   Ambulation activity did not occur: Safety/medical concerns  Assist level: 2 helpers Assistive device: (3 muskateers, R AFO) Max distance: 20 ft   Walk 10 feet activity   Assist  Walk 10 feet activity did not occur: Safety/medical concerns  Assist level: 2 helpers Assistive device: (3 muskateers, R AFO)   Walk 50 feet activity   Assist Walk 50 feet with 2 turns activity did not occur: Safety/medical concerns         Walk 150 feet activity   Assist Walk 150 feet activity did not  occur: Safety/medical concerns         Walk 10 feet on uneven surface  activity   Assist Walk 10 feet on uneven surfaces activity did not occur: Safety/medical concerns         Wheelchair     Assist Will patient use wheelchair at discharge?: Yes Type of Wheelchair: Manual Wheelchair activity did not occur: Safety/medical concerns  Wheelchair assist level: Supervision/Verbal cueing Max wheelchair distance: 100 ft    Wheelchair 50 feet with 2 turns activity    Assist    Wheelchair 50 feet with 2 turns activity did not occur: Safety/medical  concerns   Assist Level: Supervision/Verbal cueing   Wheelchair 150 feet activity     Assist Wheelchair 150 feet activity did not occur: Safety/medical concerns   Assist Level: Supervision/Verbal cueing      Medical Problem List and Plan: 1.Right greater than left-sided weaknesssecondary to intracerebral hemorrhage with GBM.Surgery was done emergently due to signs of herniation due to notes.Status post left crani for hematoma evacuation and tumor resection 10/13/2018.   Continue CIR PT, OT, SLP-team conference today  PRAFO, WHO at night  -MRI performed Tuesday with inflammatory enhancement of surgical/tumor site. Higher dose steroids initiated. Pt tolerating  -MRI later this month followed XRT and CTX potentially    - -ongoing direction and involvement of Dr. [name] and palliative care 2. Antithrombotics: -DVT/anticoagulation:Subcutaneous heparin initiated 10/16/2018 -antiplatelet therapy: N/A 3. Pain Management:Tylenol as needed 4. Mood:Provide emotional support -antipsychotic agents: N/A 5. Neuropsych: This patientis not of making decisions on hisown behalf. 6. Skin/Wound Care:Routine skin checks 7. Fluids/Electrolytes/Nutrition:intake 100% 8. Dysphagia. Dysphagia #2 thin liquids. Follow-up speech therapy  Advance diet as tolerated 9. Aphasia in setting of poor initiation and poor attention  -continue Ritalin  10mg  bid    -  amantadine 100mg  bid  -making gains in language and initiation     10. Constipation  Bowel meds as necessary 11. Spasticity  Baclofen 5mg  2 times daily started on 10/9---increased to TID 10/13  -tone controlled with this regimen at present 12. Hiccups  Baclofen as needed  Improved 13.  Steroid-induced hyperglycemia  Continue to monitor with steroid wean 14.  Hyponatremia  Sodium 135 11/2 15.  Transaminitis  LFTs   stable-recheck later this week 16.  Leukocytosis-  steroid-induced  -10/26- WBC 8k-->  13.9 11/2 as expected   LOS: 28 days A FACE TO FACE EVALUATION WAS PERFORMED  Gregory Moreno 11/18/2018, 9:12 AM

## 2018-11-18 NOTE — Plan of Care (Signed)
  Problem: Consults Goal: RH BRAIN INJURY PATIENT EDUCATION Description: Description: See Patient Education module for eduction specifics Outcome: Progressing   Problem: RH BOWEL ELIMINATION Goal: RH STG MANAGE BOWEL WITH ASSISTANCE Description: STG Manage Bowel with max Assistance. Outcome: Progressing Goal: RH STG MANAGE BOWEL W/MEDICATION W/ASSISTANCE Description: STG Manage Bowel with Medication with max Assistance. Outcome: Progressing   Problem: RH BLADDER ELIMINATION Goal: RH STG MANAGE BLADDER WITH ASSISTANCE Description: STG Manage Bladder With max Assistance Outcome: Progressing   Problem: RH SKIN INTEGRITY Goal: RH STG SKIN FREE OF INFECTION/BREAKDOWN Outcome: Progressing Goal: RH STG MAINTAIN SKIN INTEGRITY WITH ASSISTANCE Description: STG Maintain Skin Integrity With max Assistance. Outcome: Progressing Goal: RH STG ABLE TO PERFORM INCISION/WOUND CARE W/ASSISTANCE Description: STG Able To Perform Incision/Wound Care With max Assistance. Outcome: Progressing   Problem: RH SAFETY Goal: RH STG ADHERE TO SAFETY PRECAUTIONS W/ASSISTANCE/DEVICE Description: STG Adhere to Safety Precautions With max Assistance/Device. Outcome: Progressing   Problem: RH COGNITION-NURSING Goal: RH STG ANTICIPATES NEEDS/CALLS FOR ASSIST W/ASSIST/CUES Description: STG Anticipates Needs/Calls for Assist With max Assistance/Cues. Outcome: Progressing   Problem: RH PAIN MANAGEMENT Goal: RH STG PAIN MANAGED AT OR BELOW [name] PAIN GOAL Description: Less than 3 out of 10  Outcome: Progressing   Problem: RH KNOWLEDGE DEFICIT BRAIN INJURY Goal: RH STG INCREASE KNOWLEDGE OF SELF CARE AFTER BRAIN INJURY Outcome: Progressing Goal: RH STG INCREASE KNOWLEDGE OF DYSPHAGIA/FLUID INTAKE Outcome: Progressing   Problem: Consults Goal: Diabetes Guidelines if Diabetic/Glucose > 140 Description: If diabetic or lab glucose is > 140 mg/dl - Initiate Diabetes/Hyperglycemia Guidelines & Document  Interventions  Outcome: Progressing

## 2018-11-19 ENCOUNTER — Inpatient Hospital Stay (HOSPITAL_COMMUNITY): Payer: 59 | Admitting: Speech Pathology

## 2018-11-19 ENCOUNTER — Inpatient Hospital Stay (HOSPITAL_COMMUNITY): Payer: 59 | Admitting: Physical Therapy

## 2018-11-19 ENCOUNTER — Inpatient Hospital Stay (HOSPITAL_COMMUNITY): Payer: 59

## 2018-11-19 MED ORDER — BACLOFEN 5 MG HALF TABLET
5.0000 mg | ORAL_TABLET | Freq: Every day | ORAL | Status: DC | PRN
  Administered 2018-11-23: 5 mg via ORAL

## 2018-11-19 NOTE — Progress Notes (Signed)
Speech Language Pathology Weekly Progress and Session Note  Patient Details  Name: Gregory Moreno MRN: [id number] Date of Birth: [birth year]  Beginning of progress report period: November 12, 2018 End of progress report period: November 19, 2018  Today's Date: 11/19/2018 SLP Individual Time: 0700-0755 SLP Individual Time Calculation (min): 55 min  Short Term Goals: Week 4: SLP Short Term Goal 1 (Week 4): Pt will consume trials of regular diet with minimal overt s/s of aspiration/dysphagia and Min A cues for use of compensatory swallow strategies over 3 sessions prior to upgrade. SLP Short Term Goal 1 - Progress (Week 4): Met SLP Short Term Goal 2 (Week 4): Pt will utilize word finding strategies to express wants and needs with Mod A cues. SLP Short Term Goal 2 - Progress (Week 4): Met SLP Short Term Goal 3 (Week 4): Pt will demonstrate selective attention to task for ~ 30 minutes with Min A cues. SLP Short Term Goal 3 - Progress (Week 4): Met SLP Short Term Goal 4 (Week 4): Pt will complete basic problem solving tasks with Min A cues. SLP Short Term Goal 4 - Progress (Week 4): Met    New Short Term Goals: Week 5: SLP Short Term Goal 1 (Week 5): STGs=LTGs due to ELOS  Weekly Progress Updates: Patient continues to make excellent gains and has met 4 of 4 STGs this reporting period. Currently, patient is consuming regular textures with thin liquids without overt s/s of aspiration and supervision level verbal cues for use of swallowing compensatory strategies. Patient demonstrates improved cognitive functioning and can attend to familiar tasks for ~30 minutes with overall Min A verbal cues for redirection with Min-Mod verbal cues needed to demonstrate basic problem solving with functional and familiar tasks. Patient also demonstrates improved verbal expression at the phrase level in regards to basic wants/needs but continues to demonstrate moderate verbal errors which require Mod-Max A verbal cues  to self-monitor and correct. Patient and family education ongoing. Patient would benefit from continued skilled SLP intervention to maximize his cognitive, swallowing and communication function prior to discharge home.      Intensity: Minumum of 1-2 x/day, 30 to 90 minutes Frequency: 3 to 5 out of 7 days Duration/Length of Stay: 11/28/18 Treatment/Interventions: Cognitive remediation/compensation;Dysphagia/aspiration precaution training;Internal/external aids;Speech/Language facilitation;Therapeutic Activities;Environmental controls;Cueing hierarchy;Functional tasks;Patient/family education   Daily Session  Skilled Therapeutic Interventions: Skilled treatment session focused on dysphagia and cognitive-linguistic goals. SLP facilitated session by providing extra time and supervision verbal cues for tray set-up with breakfast meal of regular textures with thin liquids. Patient consumed meal with overt cough X 1, suspect due to large bolus size. Although patient with one overt cough X 1, patient is demonstrating increased use of swallowing compensatory strategies, therefore, recommend patient change to intermittent supervision. SLP also facilitated session by continuing assessment of patient's reading comprehension with the Reading Comprehension Battery for Aphasia (RCBA). Patient demonstrated 40% accuracy with comprehension of short paragraphs with patient demonstrating emergent awareness in regards to difficulty of task. Patient left upright in bed with alarm on and all needs within reach. Continue with current plan of care.      Pain No/Denies Pain   Therapy/Group: Individual Therapy  Teara Duerksen 11/19/2018, 6:42 AM

## 2018-11-19 NOTE — Progress Notes (Signed)
Occupational Therapy Session Note  Patient Details  Name: Gregory Moreno MRN: [id number] Date of Birth: 1995-12-10  Today's Date: 11/19/2018 OT Individual Time: 0900-1000 OT Individual Time Calculation (min): 60 min    Short Term Goals: Week 4:  OT Short Term Goal 1 (Week 4): Pt will sit EOM during functional activity with min A for 5 mins OT Short Term Goal 2 (Week 4): Pt wash UB with no VC for sequencing to demonstrate R attention OT Short Term Goal 3 (Week 4): Pt will complete SBT to [facility] with min A OT Short Term Goal 4 (Week 4): Pt will complete sit <> stand with mod A for clothing management  Skilled Therapeutic Interventions/Progress Updates:    Patient seated in w/c, ready for therapy - mom states that he called her multiple times overnight due to leg spasms/pain - he states that currently it is bothering him a little bit.  Sit pivot transfer to/from mat table and w/c with min A - cues for midline and facilitation of right side during transfer.  Unsupported sitting with CS occ cues for upright trunk.  Increased volitional flexion of elbow, wrist and digits noted today - requiring some inhibition with attempts to extend at those joints - activation activities completed in supine and SSP with less control noted today at shoulder and elbow ext.  Weight bearing in SSP and in squat position.  Patient returned to room seated in w/c, mom present.    Therapy Documentation Precautions:  Precautions Precautions: Fall Precaution Comments: L crani Restrictions Weight Bearing Restrictions: No General:   Vital Signs:   Pain: Pain Assessment Pain Scale: 0-10 Pain Score: 2  Pain Location: Leg Pain Orientation: Left Pain Descriptors / Indicators: Discomfort Pain Intervention(s): Repositioned   Therapy/Group: Individual Therapy  Carlos Levering 11/19/2018, 12:02 PM

## 2018-11-19 NOTE — Progress Notes (Signed)
Physical Therapy Session Note  Patient Details  Name: Gregory Moreno MRN: [id number] Date of Birth: [birth year]  Today's Date: 11/19/2018 PT Individual Time: [id number] PT Individual Time Calculation (min): 77 min   Short Term Goals: Week 5:  PT Short Term Goal 1 (Week 5): STG = LTG due to estimated d/c date.  Skilled Therapeutic Interventions/Progress Updates:  Pt received in bathroom & agreeable. Pt with continent void on toilet but missing toilet & soiling pants so therapist provided total assist for donning clean brief & pants. Pt requires one person to assist with standing balance & 2nd person to pull brief & clothing up over hips, with pt returning to w/c via stand pivot with max assist. Pt performed hand hygiene at sink from w/c level with assistance. Transported pt to/from gym via w/c dependent assist for time management. Pt transfers w/c<>mat table with mod assist via stand pivot. Pt transfers sit>supine with mod assist, supine>sit with min assist and rolling with min assist with ongoing cuing for technique with pt demonstrating increased ease of transferring R sidelying>sitting vs L sidelying>sitting. Pt is positioned on EOM with 1 LE off EOM for hip flexor stretch with pt reporting feeling a stretch more in distal aspect of thighs. Pt then lies supine on mat table & therapist places sheet under pt's pelvis and pulls sheet anteriorly to attempt to achieve lumbar extension and increased pelvic ROM as pt continues to demonstrate anterior pelvic tilt. Gregory Moreno Insurance account manager) then arrives for AFO consult & pt ambulates ~6 ft with hemiwalker, max assist + w/c follow for safety with therapist providing heavy cuing & assist for upright posture as pt leans forward & to R and advancement & placement of RLE, with pt demonstrating R knee A/P instability. Pt then ambulates ~10 ft with hemiwalker, R GRAFO & heel wedge with improved posture and requiring decreased stabilization R knee. Pt encouraged to kick  RLE forward with some slight hip flexion noted but not enough to begin to advance foot. At this time Gregory Moreno recommends toe cap, heel wedge & GRAFO, but we plan to wait to provide pt with final AFO in hopes that he progresses in strength & neuromuscular control prior to d/c; will f/u with Gregory Moreno prior to pt d/c. Pt then returns to mat in same manner noted & transitions to L sidelying with powder board placed between BLE. Pt requires manual facilitation for neutral L sidelying and pelvic alignment and therapist provides multimodal cuing & AAROM assist for R hip flexion & extension in gravity eliminated position. Pt is able to demonstrate some active R hip flexion in this position but also question how much of this is tone kicking in. Pt assisted back to bed & discussed grounds pass with PA (Gregory Moreno) & pt's mother, with PA approving of pt's parents assisting him to [place] shop area. At end of session pt left in w/c with mother present to supervise.   Therapy Documentation Precautions:  Precautions Precautions: Fall Precaution Comments: L crani Restrictions Weight Bearing Restrictions: No  Pain: Pt c/o ongoing spasms in LLE but pt's mom reports he's been receiving baclofen for spasms.  Therapy/Group: Individual Therapy  Gregory Moreno 11/19/2018, 3:41 PM

## 2018-11-19 NOTE — Progress Notes (Signed)
Social Work Patient ID: Gregory Moreno, male   DOB: July 17, 1995, 23 y.o.   MRN: [id number]  Met with pt and Gregory Moreno yesterday afternoon to review team conference.  Both aware that team continues to plan toward 11/13 discharge and min A goals overall.  Very pleased with progress.  Pt smiling and jokes that he will be mad if we try to add any more time to his stay.  Gregory Moreno notes they have started some hands on and will continue.    Ladavia Lindenbaum, LCSW

## 2018-11-19 NOTE — Progress Notes (Signed)
[facility] PHYSICAL MEDICINE & REHABILITATION PROGRESS NOTE  Subjective/Complaints:  Complains of muscle spasms last night in his left leg that prevented him from sleeping. Mother is at bedside.   ROS: Limited due to cognitive/behavioral   Objective: Vital Signs: Blood pressure 130/80, pulse 84, temperature 98.9 F (37.2 C), temperature source Oral, resp. rate 16, height 5\' 7"  (1.702 m), weight 66 kg, SpO2 99 %. No results found. Recent Labs    11/17/18 0500  WBC 13.9*  HGB 12.4*  HCT 37.9*  PLT 252   Recent Labs    11/17/18 0500  NA 135  K 4.0  CL 99  CO2 24  GLUCOSE 99  BUN 17  CREATININE 0.77  CALCIUM 9.2    Physical Exam: BP 130/80 (BP Location: Left Arm)   Pulse 84   Temp 98.9 F (37.2 C) (Oral)   Resp 16   Ht 5\' 7"  (1.702 m)   Wt 66 kg   SpO2 99%   BMI 22.79 kg/m  Constitutional: No distress . Vital signs reviewed. HEENT: EOMI, oral membranes moist Neck: supple Cardiovascular: RRR without murmur. No JVD    Respiratory: CTA Bilaterally without wheezes or rales. Normal effort    GI: BS +, non-tender, non-distended  Skin: Warm and dry.  Intact. Psych: alert and smiling. Continues to verbalize more.  Musc: No edema in extremities.  No tenderness in extremities. Neuro: alert and continues to engage more in conversation. Struggles with more complex information Motor:  RUE and RLE 0/5, except for some movement of fingers of right hand today and 1/5 wrist extension. No resting tone today.   Assessment/Plan: 1. 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  Physiatrist is providing close team supervision and 24 hour management of active medical problems listed below.  Physiatrist and rehab team continue to assess barriers to discharge/monitor patient progress toward functional and medical goals  Care Tool:  Bathing     Body parts bathed by patient: Right arm, Chest, Front perineal area, Abdomen, Right upper leg, Left upper leg, Face, Right lower leg, Left lower leg, Buttocks   Body parts bathed by helper: Left arm     Bathing assist Assist Level: Moderate Assistance - Patient 50 - 74%     Upper Body Dressing/Undressing Upper body dressing   What is the patient wearing?: Pull over shirt    Upper body assist Assist Level: Contact Guard/Touching assist    Lower Body Dressing/Undressing Lower body dressing      What is the patient wearing?: Pants     Lower body assist Assist for lower body dressing: Moderate Assistance - Patient 50 - 74%     Toileting Toileting    Toileting assist Assist for toileting: Maximal Assistance - Patient 25 - 49%     Transfers Chair/bed transfer  Transfers assist  Chair/bed transfer activity did not occur: Safety/medical concerns  Chair/bed transfer assist level: Moderate Assistance - Patient 50 - 74%     Locomotion Ambulation   Ambulation assist   Ambulation activity did not occur: Safety/medical concerns  Assist level: 2 helpers(mod/max assist + w/c follow for safety) Assistive device: Walker-hemi(R GRAFO, heel wedge) Max distance: 63 ft   Walk 10 feet activity   Assist  Walk 10 feet activity did not occur: Safety/medical concerns  Assist level: 2 helpers(mod/max assist + w/c follow for safety) Assistive device: Walker-hemi(R GRAFO, heel wedge)  Walk 50 feet activity   Assist Walk 50 feet with 2 turns activity did not occur: Safety/medical concerns  Assist level: 2 helpers(mod/max assist + w/c follow for safety) Assistive device: Walker-hemi(R GRAFO, heel wedge)    Walk 150 feet activity   Assist Walk 150 feet activity did not occur: Safety/medical concerns         Walk 10 feet on uneven surface  activity   Assist Walk 10 feet on uneven surfaces activity did not occur: Safety/medical concerns          Wheelchair     Assist Will patient use wheelchair at discharge?: Yes Type of Wheelchair: Manual Wheelchair activity did not occur: Safety/medical concerns  Wheelchair assist level: Supervision/Verbal cueing Max wheelchair distance: 100 ft    Wheelchair 50 feet with 2 turns activity    Assist    Wheelchair 50 feet with 2 turns activity did not occur: Safety/medical concerns   Assist Level: Supervision/Verbal cueing   Wheelchair 150 feet activity     Assist Wheelchair 150 feet activity did not occur: Safety/medical concerns   Assist Level: Supervision/Verbal cueing      Medical Problem List and Plan: 1.Right greater than left-sided weaknesssecondary to intracerebral hemorrhage with GBM.Surgery was done emergently due to signs of herniation due to notes.Status post left crani for hematoma evacuation and tumor resection 10/13/2018.   PRAFO, WHO at night  -MRI performed Tuesday with inflammatory enhancement of surgical/tumor site. Higher dose steroids initiated. Pt tolerating  -MRI later this month followed XRT and CTX potentially -ongoing direction and involvement of Dr. [name] and palliative care 2. Antithrombotics: -DVT/anticoagulation:Subcutaneous heparin initiated 10/16/2018 -antiplatelet therapy: N/A 3. Pain Management:Tylenol as needed 4. Mood:Provide emotional support -antipsychotic agents: N/A 5. Neuropsych: This patientis not of making decisions on hisown behalf. 6. Skin/Wound Care:Routine skin checks 7. Fluids/Electrolytes/Nutrition:intake 100% 8. Dysphagia. Dysphagia #2 thin liquids. Follow-up speech therapy  Advance diet as tolerated 9. 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 Constipation  Bowel meds as necessary 11. Spasticity  Baclofen 5mg  2 times daily started on 10/9---increased to  TID 10/13  -tone controlled with this regimen at present  -Will add an addition 5mg  prn of Baclofen for patient to use if he experiences muscle spasm at night again, which prevented him from sleeping.  12. Hiccups  Baclofen as needed  Improved 13.  Steroid-induced hyperglycemia  Continue to monitor with steroid wean 14.  Hyponatremia  Sodium 135 11/2 15.  Transaminitis  LFTs   stable-recheck later this week 16.  Leukocytosis-  steroid-induced  -10/26- WBC 8k--> 13.9 11/2 as expected   LOS: 29 days A FACE TO [place] 11/19/2018, 8:09 AM

## 2018-11-19 NOTE — Progress Notes (Signed)
Orthopedic Tech Progress Note Patient Details:  Gregory Moreno 1995-12-09 [id number] Called in order to [facility] for a AFO Patient ID: Gregory Moreno, male   DOB: Apr 02, 1995, 23 y.o.   MRN: [id number]   Gregory Moreno 11/19/2018, 3:43 PM

## 2018-11-20 ENCOUNTER — Inpatient Hospital Stay (HOSPITAL_COMMUNITY): Payer: 59 | Admitting: Speech Pathology

## 2018-11-20 ENCOUNTER — Inpatient Hospital Stay (HOSPITAL_COMMUNITY): Payer: 59 | Admitting: Physical Therapy

## 2018-11-20 ENCOUNTER — Inpatient Hospital Stay (HOSPITAL_COMMUNITY): Payer: 59

## 2018-11-20 MED ORDER — PANTOPRAZOLE SODIUM 40 MG PO TBEC
40.0000 mg | DELAYED_RELEASE_TABLET | Freq: Every day | ORAL | Status: DC
  Administered 2018-11-21 – 2018-11-27 (×7): 40 mg via ORAL
  Filled 2018-11-20 (×8): qty 1

## 2018-11-20 NOTE — Progress Notes (Signed)
[facility] PHYSICAL MEDICINE & REHABILITATION PROGRESS NOTE  Subjective/Complaints:  Seems to have had a little better night. Up in BR with OT  ROS: limited due to language/communication   Objective: Vital Signs: Blood pressure 117/60, pulse 82, temperature 97.6 F (36.4 C), temperature source Oral, resp. rate 16, height 5\' 7"  (1.702 m), weight 66.5 kg, SpO2 98 %. No results found. No results for input(s): WBC, HGB, HCT, PLT in the last 72 hours. No results for input(s): NA, K, CL, CO2, GLUCOSE, BUN, CREATININE, CALCIUM in the last 72 hours.  Physical Exam: BP 117/60 (BP Location: Right Arm)   Pulse 82   Temp 97.6 F (36.4 C) (Oral)   Resp 16   Ht 5\' 7"  (1.702 m)   Wt 66.5 kg   SpO2 98%   BMI 22.96 kg/m  Constitutional: No distress . Vital signs reviewed. HEENT: EOMI, oral membranes moist Neck: supple Cardiovascular: RRR without murmur. No JVD    Respiratory: CTA Bilaterally without wheezes or rales. Normal effort    GI: BS +, non-tender, non-distended   Skin: Warm and dry.  Intact. Psych: alert and smiling/good spirits.  Musc: No edema in extremities.  No tenderness in extremities. Neuro: alert and continues to engage more in conversation. Struggles with more complex information Motor:  RUE and RLE 0-1/5. Mild resting tone. DTR's 2++  Assessment/Plan: 1. 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  Physiatrist is providing close team supervision and 24 hour management of active medical problems listed below.  Physiatrist and rehab team continue to assess barriers to discharge/monitor patient progress toward functional and medical goals  Care Tool:  Bathing    Body parts bathed by patient: Right arm, Chest, Front perineal area, Abdomen, Right upper leg, Left upper leg, Face, Right lower leg, Left lower leg, Buttocks   Body parts  bathed by helper: Left arm     Bathing assist Assist Level: Moderate Assistance - Patient 50 - 74%     Upper Body Dressing/Undressing Upper body dressing   What is the patient wearing?: Pull over shirt    Upper body assist Assist Level: Contact Guard/Touching assist    Lower Body Dressing/Undressing Lower body dressing      What is the patient wearing?: Pants     Lower body assist Assist for lower body dressing: Moderate Assistance - Patient 50 - 74%     Toileting Toileting    Toileting assist Assist for toileting: Maximal Assistance - Patient 25 - 49%     Transfers Chair/bed transfer  Transfers assist  Chair/bed transfer activity did not occur: Safety/medical concerns  Chair/bed transfer assist level: Moderate Assistance - Patient 50 - 74%     Locomotion Ambulation   Ambulation assist   Ambulation activity did not occur: Safety/medical concerns  Assist level: 2 helpers(max assist + w/c follow for safety) Assistive device: Walker-hemi Max distance: 10 ft   Walk 10 feet activity   Assist  Walk 10 feet activity did not occur: Safety/medical concerns  Assist level: 2 helpers Assistive device: Walker-hemi   Walk 50 feet activity   Assist Walk 50 feet with 2 turns activity did not occur: Safety/medical concerns  Assist level: 2 helpers(mod/max assist + w/c follow for safety) Assistive device: Walker-hemi(R GRAFO, heel wedge)    Walk 150 feet activity   Assist Walk 150 feet activity did not occur: Safety/medical concerns  Walk 10 feet on uneven surface  activity   Assist Walk 10 feet on uneven surfaces activity did not occur: Safety/medical concerns         Wheelchair     Assist Will patient use wheelchair at discharge?: Yes Type of Wheelchair: Manual Wheelchair activity did not occur: Safety/medical concerns  Wheelchair assist level: Supervision/Verbal cueing Max wheelchair distance: 100 ft    Wheelchair 50 feet  with 2 turns activity    Assist    Wheelchair 50 feet with 2 turns activity did not occur: Safety/medical concerns   Assist Level: Supervision/Verbal cueing   Wheelchair 150 feet activity     Assist Wheelchair 150 feet activity did not occur: Safety/medical concerns   Assist Level: Supervision/Verbal cueing      Medical Problem List and Plan: 1.Right greater than left-sided weaknesssecondary to intracerebral hemorrhage with GBM.Surgery was done emergently due to signs of herniation due to notes.Status post left crani for hematoma evacuation and tumor resection 10/13/2018.   PRAFO, WHO at night  -Most recent MRI   with inflammatory enhancement of surgical/tumor site. Higher dose steroids initiated. Pt tolerating  -MRI later this month followed XRT and CTX potentially -ongoing direction and involvement of Dr. [name] and palliative care 2. Antithrombotics: -DVT/anticoagulation:Subcutaneous heparin initiated 10/16/2018 -antiplatelet therapy: N/A 3. Pain Management:Tylenol as needed 4. Mood:Provide emotional support -antipsychotic agents: N/A 5. Neuropsych: This patientis not of making decisions on hisown behalf. 6. Skin/Wound Care:Routine skin checks 7. Fluids/Electrolytes/Nutrition:intake 100% 8. Dysphagia. Dysphagia #2 thin liquids. 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 Constipation  Bowel meds as necessary 11. Spasticity  Baclofen 5mg  2 times daily started on 10/9---increased to TID 10/13   -prn dose added for breakthrough spasms 11/4 12. Hiccups  Baclofen as needed  Improved 13.  Steroid-induced hyperglycemia  Continue to monitor with steroid wean 14.  Hyponatremia  Sodium 135 11/2 15.  Transaminitis  LFTs   stable-recheck Monday 16.   Leukocytosis-  steroid-induced  -10/26- WBC 8k--> 13.9 11/2 as expected  -recheck Monday  LOS: 30 days A FACE TO [place] 11/20/2018, 9:30 AM

## 2018-11-20 NOTE — Progress Notes (Signed)
Occupational Therapy Weekly Progress Note  Patient Details  Name: Gregory Moreno MRN: [id number] Date of Birth: 03/25/1995  Beginning of progress report period: November 12, 2018 End of progress report period: November 20, 2018  Today's Date: 11/20/2018 OT Individual Time: 2831-5176 OT Individual Time Calculation (min): 60 min    Patient has met 4 of 4 short term goals.  Pt continues to make excellent progress toward his OT POC. He is able to don a shirt with CGA and pants with min-mod A overall. He is improving in his processing, orientation, and functional communication. Hands on family education has been initiated and is continuing to ensure maximal carryover and safety at home. [name] RUE is demonstrating improvement with fluctuations in ability, with occasional full gravity eliminated extension, and trace activation in all joints.   Patient continues to demonstrate the following deficits: muscle weakness, impaired timing and sequencing, unbalanced muscle activation, motor apraxia, decreased coordination and decreased motor planning, decreased initiation, decreased attention, decreased awareness, decreased problem solving, decreased safety awareness, decreased memory and delayed processing and decreased sitting balance, decreased standing balance, decreased postural control, hemiplegia and decreased balance strategies and therefore will continue to benefit from skilled OT intervention to enhance overall performance with BADL.  Patient progressing toward long term goals..  Continue plan of care.  OT Short Term Goals Week 4:  OT Short Term Goal 1 (Week 4): Pt will sit EOM during functional activity with min A for 5 mins OT Short Term Goal 1 - Progress (Week 4): Met OT Short Term Goal 2 (Week 4): Pt wash UB with no VC for sequencing to demonstrate R attention OT Short Term Goal 2 - Progress (Week 4): Met OT Short Term Goal 3 (Week 4): Pt will complete SBT to [facility] with min A OT Short Term Goal 3  - Progress (Week 4): Met OT Short Term Goal 4 (Week 4): Pt will complete sit <> stand with mod A for clothing management OT Short Term Goal 4 - Progress (Week 4): Met Week 5:  OT Short Term Goal 1 (Week 5): Continue progressing toward established LTG  Skilled Therapeutic Interventions/Progress Updates:    Pt received supine with no c/o pain. Pt states he slept better last night and had less spasms. Pt completed bed mobility to EOB with minimal cueing with CGA. Pt completed ambulatory transfer into the bathroom with mod A, max facilitation required for RLE swing through. Pt transferred into walk in shower on [name] facing in, requiring side step. [name] mother was present throughout shower for family education. Edu provided throughout re safety of pt and family when assisting, energy conservation techniques, positioning, body mechanics, and use of AE. Pt completed UB bathing with use of wash mit on the RUE. Pt able to activate R shoulder internal rotation and elbow flex to wash his abdomen and R upper leg. Pt encouraged to complete self HOH with RUE to force use. Edu provided to mother re protection of R shoulder and prevention of subluxation. Pt required min balance support when reaching dynamically to his distal LE. Pt completed stand pivot transfer to the w/c, toward the R side with mod A with poor pivot and increased R spasms/flexion. Pt able to don shirt and apply deodorant with min cueing for technique and CGA overall. Pt donned pants with mod A. Pt was left sitting up with all needs met, mother present to assist with oral care.   Therapy Documentation Precautions:  Precautions Precautions: Fall Precaution Comments: L crani Restrictions  Weight Bearing Restrictions: No   Therapy/Group: Individual Therapy  [name] 11/20/2018, 7:13 AM

## 2018-11-20 NOTE — Progress Notes (Signed)
Physical Therapy Session Note  Patient Details  Name: Gregory Moreno MRN: [id number] Date of Birth: 1995/11/06  Today's Date: 11/20/2018 PT Individual Time: 0233-4356 PT Individual Time Calculation (min): 58 min   Short Term Goals: Week 5:  PT Short Term Goal 1 (Week 5): STG = LTG due to estimated d/c date.  Skilled Therapeutic Interventions/Progress Updates:   Pt in w/c and agreeable to therapy, no c/o pain. [name] mother present throughout session. Total assist w/c transport to therapy gym. Worked on RLE NMR in supine including knee-to-chest, supine bridge, and L rolling by pushing through RLE to turn. Tactile and verbal cues for hamstring, glut, and hip flexor activation. Strong glut, hamstring, and quad activation during exercises, but trace hip flexion. Worked on NMR in standing while standing to either HW or RW. RW w/ RUE orthosis to work on [name]. Performed L forward and backward stepping to facilitate R quad activation in weight acceptance. Donned R DF ACE wrap to facilitate increased R toe clearance and worked on R hip flexion by kicking ball. After multiple attempts, pt able to achieve hip flexion to kick ball a small distance for multiple reps! Transitioned to L rail and ambulated 30' w/ max assist overall for lateral weight shifting and acieveing neutral upright posture, no assistance needed for RLE management except for 2-3 instances of step placement. Pt wearing R heel wedge and DF ACE wrap on RLE for increased foot clearance. Pt self-propelled w/c back to room w/ supervision via L hemi technique, increased time as pt needed multiple instances of redirecting 2/2 tendency to steer R. Ended session in w/c and in care of mom, all needs met.   Therapy Documentation Precautions:  Precautions Precautions: Fall Precaution Comments: L crani Restrictions Weight Bearing Restrictions: No Vital Signs:   Pain:    Therapy/Group: Individual Therapy  Dominick Zertuche K Markanthony Gedney 11/20/2018, 12:12 PM

## 2018-11-20 NOTE — Plan of Care (Signed)
  Problem: Consults Goal: RH BRAIN INJURY PATIENT EDUCATION Description: Description: See Patient Education module for eduction specifics Outcome: Progressing   Problem: RH BOWEL ELIMINATION Goal: RH STG MANAGE BOWEL WITH ASSISTANCE Description: STG Manage Bowel with max Assistance. Outcome: Progressing Goal: RH STG MANAGE BOWEL W/MEDICATION W/ASSISTANCE Description: STG Manage Bowel with Medication with max Assistance. Outcome: Progressing   Problem: RH BLADDER ELIMINATION Goal: RH STG MANAGE BLADDER WITH ASSISTANCE Description: STG Manage Bladder With max Assistance Outcome: Progressing   Problem: RH SKIN INTEGRITY Goal: RH STG SKIN FREE OF INFECTION/BREAKDOWN Description: Min assist Outcome: Progressing Goal: RH STG MAINTAIN SKIN INTEGRITY WITH ASSISTANCE Description: STG Maintain Skin Integrity With max Assistance. Outcome: Progressing   Problem: RH SAFETY Goal: RH STG ADHERE TO SAFETY PRECAUTIONS W/ASSISTANCE/DEVICE Description: STG Adhere to Safety Precautions With max Assistance/Device. Outcome: Progressing   Problem: RH COGNITION-NURSING Goal: RH STG ANTICIPATES NEEDS/CALLS FOR ASSIST W/ASSIST/CUES Description: STG Anticipates Needs/Calls for Assist With max Assistance/Cues. Outcome: Progressing   Problem: RH PAIN MANAGEMENT Goal: RH STG PAIN MANAGED AT OR BELOW [name] PAIN GOAL Description: Less than 3 out of 10  Outcome: Progressing   Problem: RH KNOWLEDGE DEFICIT BRAIN INJURY Goal: RH STG INCREASE KNOWLEDGE OF SELF CARE AFTER BRAIN INJURY Description: Min assist and reminders to review handouts and educational materials Outcome: Progressing   Problem: Consults Goal: Diabetes Guidelines if Diabetic/Glucose > 140 Description: If diabetic or lab glucose is > 140 mg/dl - Initiate Diabetes/Hyperglycemia Guidelines & Document Interventions  Outcome: Progressing

## 2018-11-20 NOTE — Progress Notes (Signed)
Speech Language Pathology Daily Session Note  Patient Details  Name: Gregory Moreno MRN: [id number] Date of Birth: August 23, 1995  Today's Date: 11/20/2018   Session 1: SLP Individual Time: [id number] SLP Individual Time Calculation (min): 30 min   Session 2: SLP Individual Time: 1430-1500 SLP Individual Time Calculation (min): 30 min  Short Term Goals: Week 5: SLP Short Term Goal 1 (Week 5): STGs=LTGs due to ELOS  Skilled Therapeutic Interventions:   Session 1: Skilled treatment session focused on cognitive and dysphagia goals. Upon arrival, patient requested to use the bathroom. Min A verbal cues were needed for sequencing and problem solving with transfer via the Stedy. Patient was able to successfully void. Patient also consumed breakfast meal of regular textures with thin liquids without overt s/s of aspiration and overall Mod I for use of swallowing compensatory strategies. Patient with increased vocal intensity which  Maximized intelligibility to ~75% at the phrase level. Patient left upright in bed with alarm on and all needs within reach. Continue with current plan of care.  Session 2: Skilled treatment session focused on speech goals. SLP facilitated session by providing Mod phonemic and sentence completion cues for word-finding while naming functional items. Max A multimodal cues were also needed to compare and contrast 2 functional items at the word and phrase level. Patient requested to use the bathroom and was able to successfully void on the commode. Patient left upright in wheelchair with mother present. Continue with current plan of care.   Pain No/Denies Pain   Therapy/Group: Individual Therapy  Cartez Mogle 11/20/2018, 3:32 PM

## 2018-11-21 ENCOUNTER — Inpatient Hospital Stay (HOSPITAL_COMMUNITY): Payer: 59

## 2018-11-21 ENCOUNTER — Inpatient Hospital Stay (HOSPITAL_COMMUNITY): Payer: 59 | Admitting: Physical Therapy

## 2018-11-21 ENCOUNTER — Inpatient Hospital Stay (HOSPITAL_COMMUNITY): Payer: 59 | Admitting: Speech Pathology

## 2018-11-21 LAB — URINALYSIS, COMPLETE (UACMP) WITH MICROSCOPIC
Bacteria, UA: NONE SEEN
Bilirubin Urine: NEGATIVE
Glucose, UA: NEGATIVE mg/dL
Hgb urine dipstick: NEGATIVE
Ketones, ur: NEGATIVE mg/dL
Leukocytes,Ua: NEGATIVE
Nitrite: NEGATIVE
Protein, ur: NEGATIVE mg/dL
Specific Gravity, Urine: 1.026 (ref 1.005–1.030)
pH: 6 (ref 5.0–8.0)

## 2018-11-21 NOTE — Progress Notes (Signed)
[place] PHYSICAL MEDICINE & REHABILITATION PROGRESS NOTE  Subjective/Complaints:  Up eating breakfast. No complaints. RN reports pt has had burning with urination. Did not indicate that to me before.   ROS: limited due to language/communication   Objective: Vital Signs: Blood pressure 123/77, pulse 90, temperature 97.7 F (36.5 C), resp. rate 20, height 5\' 7"  (1.702 m), weight 66.5 kg, SpO2 98 %. No results found. No results for input(s): WBC, HGB, HCT, PLT in the last 72 hours. No results for input(s): NA, K, CL, CO2, GLUCOSE, BUN, CREATININE, CALCIUM in the last 72 hours.  Physical Exam: BP 123/77 (BP Location: Left Arm)   Pulse 90   Temp 97.7 F (36.5 C)   Resp 20   Ht 5\' 7"  (1.702 m)   Wt 66.5 kg   SpO2 98%   BMI 22.96 kg/m  Constitutional: No distress . Vital signs reviewed. HEENT: EOMI, oral membranes moist Neck: supple Cardiovascular: RRR without murmur. No JVD    Respiratory: CTA Bilaterally without wheezes or rales. Normal effort    GI: BS +, non-tender, non-distended  Skin: Warm and dry.  Intact. Psych: alert and smiling/good spirits.  Musc: No edema in extremities.  No tenderness in extremities. Neuro: alert and engaging more. More spontaneous language. Struggles with more complex information Motor:  RUE and RLE 0-1/5. Mild resting tone. DTR's 2++  Assessment/Plan: 1. 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  Physiatrist is providing close team supervision and 24 hour management of active medical problems listed below.  Physiatrist and rehab team continue to assess barriers to discharge/monitor patient progress toward functional and medical goals  Care Tool:  Bathing    Body parts bathed by patient: Right arm, Chest, Front perineal area, Abdomen, Right upper leg, Left upper leg, Face, Right lower leg, Left lower  leg, Buttocks, Left arm   Body parts bathed by helper: Left arm     Bathing assist Assist Level: Contact Guard/Touching assist     Upper Body Dressing/Undressing Upper body dressing   What is the patient wearing?: Pull over shirt    Upper body assist Assist Level: Contact Guard/Touching assist    Lower Body Dressing/Undressing Lower body dressing      What is the patient wearing?: Pants     Lower body assist Assist for lower body dressing: Moderate Assistance - Patient 50 - 74%     Toileting Toileting    Toileting assist Assist for toileting: Contact Guard/Touching assist     Transfers Chair/bed transfer  Transfers assist  Chair/bed transfer activity did not occur: Safety/medical concerns  Chair/bed transfer assist level: Minimal Assistance - Patient > 75%     Locomotion Ambulation   Ambulation assist   Ambulation activity did not occur: Safety/medical concerns  Assist level: Maximal Assistance - Patient 25 - 49% Assistive device: Other (comment)(rail in hallway) Max distance: 30'   Walk 10 feet activity   Assist  Walk 10 feet activity did not occur: Safety/medical concerns  Assist level: Maximal Assistance - Patient 25 - 49% Assistive device: Other (comment)(rail in hallway)   Walk 50 feet activity   Assist Walk 50 feet with 2 turns activity did not occur: Safety/medical concerns  Assist level: 2 helpers(mod/max assist + w/c follow for safety) Assistive device: Walker-hemi(R GRAFO, heel wedge)    Walk 150 feet activity   Assist Walk 150 feet activity did not occur: Safety/medical  concerns         Walk 10 feet on uneven surface  activity   Assist Walk 10 feet on uneven surfaces activity did not occur: Safety/medical concerns         Wheelchair     Assist Will patient use wheelchair at discharge?: Yes Type of Wheelchair: Manual Wheelchair activity did not occur: Safety/medical concerns  Wheelchair assist level:  Supervision/Verbal cueing Max wheelchair distance: 100 ft    Wheelchair 50 feet with 2 turns activity    Assist    Wheelchair 50 feet with 2 turns activity did not occur: Safety/medical concerns   Assist Level: Supervision/Verbal cueing   Wheelchair 150 feet activity     Assist Wheelchair 150 feet activity did not occur: Safety/medical concerns   Assist Level: Supervision/Verbal cueing      Medical Problem List and Plan: 1.Right greater than left-sided weaknesssecondary to intracerebral hemorrhage with GBM.Surgery was done emergently due to signs of herniation due to notes.Status post left crani for hematoma evacuation and tumor resection 10/13/2018.   Continue CIR therapies  PRAFO, WHO at night  -Most recent MRI   with inflammatory enhancement of surgical/tumor site. Higher dose steroids initiated. Pt tolerating  -MRI later this month followed XRT and CTX potentially -ongoing direction and involvement of Dr. [name] and palliative care 2. Antithrombotics: -DVT/anticoagulation:Subcutaneous heparin initiated 10/16/2018 -antiplatelet therapy: N/A 3. Pain Management:Tylenol as needed 4. Mood:Provide emotional support -antipsychotic agents: N/A 5. Neuropsych: This patientis not of making decisions on hisown behalf. 6. Skin/Wound Care:Routine skin checks 7. Fluids/Electrolytes/Nutrition:intake 100% 8. Dysphagia. Dysphagia #2 thin liquids. 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 Constipation  Bowel meds as necessary 11. Spasticity  Baclofen 5mg  2 times daily started on 10/9---increased to TID 10/13   -prn dose added for breakthrough spasms 11/4 12. Hiccups  Baclofen as needed  Improved 13.  Steroid-induced hyperglycemia  Continue to monitor with  steroid wean 14.  Hyponatremia  Sodium 135 11/2 15.  Transaminitis  LFTs   stable-recheck Monday 16.  Leukocytosis-  steroid-induced  -10/26- WBC 8k--> 13.9 11/2 as expected  -recheck Monday  -given dysuria reported, will check UA today  LOS: 31 days A FACE TO FACE EVALUATION WAS PERFORMED  Meredith Staggers 11/21/2018, 10:06 AM

## 2018-11-21 NOTE — Progress Notes (Signed)
Speech Language Pathology Daily Session Note  Patient Details  Name: Gregory Moreno MRN: [id number] Date of Birth: [birth year]  Today's Date: 11/21/2018 SLP Individual Time: 1115-1145 SLP Individual Time Calculation (min): 30 min  Short Term Goals: Week 5: SLP Short Term Goal 1 (Week 5): STGs=LTGs due to ELOS  Skilled Therapeutic Interventions: Skilled treatment session focused on language goals. SLP facilitated session by providing Max A semantic and phonemic cues for naming of 10 familiar items, however, cueing decreased with repetition. Decreased cues were needed while reading aloud at the word level with patient requiring only Min A verbal cues. Patient left upright in wheelchair with alarm on, father present, and all needs within reach. Continue with current plan of care.    Pain No/Denies Pain   Therapy/Group: Individual Therapy  [name] 11/21/2018, 12:38 PM

## 2018-11-21 NOTE — Progress Notes (Addendum)
Nurse was called to patients room this morning & stated that he was having some burning when he urinated that has been happening for the last 2 days & that he could not urinate. He was encouraged to drink some water & informed that the provider would be contacted. On call provider was informed & previous intervention was agreed upon, also cranberry juice for now. Will be seen during rounds. No new orders at this time.

## 2018-11-21 NOTE — Progress Notes (Addendum)
Physical Therapy Session Note  Patient Details  Name: Gregory Moreno MRN: [id number] Date of Birth: 02/01/1995  Today's Date: 11/21/2018 PT Individual Time: [id number] PT Individual Time Calculation (min): 71 min   Short Term Goals: Week 5:  PT Short Term Goal 1 (Week 5): STG = LTG due to estimated d/c date.  Skilled Therapeutic Interventions/Progress Updates:  Pt received in room with NT assisting him out of bathroom. Therapist assisted pt with sit<>stand from w/c with min assist & cuing to push up on armrest to standing. Therapist & NT provides total assist for donning clean brief & pants total assist. Therapist educated pt's father Gregory Moreno) on use of R GRAFO to assist with transfers, and heel wedge in shoe to help decrease R knee hyperextension. [name] father Gregory Moreno without cuing & is able to adjust it for pain relief per pt's instructions. Pt propels w/c room>elevators and ortho gym>gym during session with cuing to sit back in chair as pt with significant anterior lean and cuing to place LLE out in front of him vs underneath chair but pt requires min assist for steering 2/2 pt frequently running into objects on R. Remainder of session focused on caregiver training & hands on practice. Therapist demonstrates squat pivot w/c<>couch and pt & father return demonstrate transfer in this manner & a 2nd time with slide board with only min cuing from therapist. Pt & therapist practiced car transfer at elevated jeep simulated height with stand pivot and max assist with pt & father then practicing x 2 trials. Pt & father require cuing to scoot RLE underneath pt to allow increased BOS & stability for transfer, as this allows pt to stand more upright vs hanging on caregiver. Pt & caregiver would benefit from practicing actual car transfer prior to d/c. At end of session pt left in w/c in room under supervision of father.  Therapy Documentation Precautions:  Precautions Precautions:  Fall Precaution Comments: L crani Restrictions Weight Bearing Restrictions: No   Therapy/Group: Individual Therapy  Gregory Moreno 11/21/2018, 3:30 PM

## 2018-11-21 NOTE — Progress Notes (Signed)
Occupational Therapy Session Note  Patient Details  Name: Gregory Moreno MRN: [id number] Date of Birth: [birth year]  Today's Date: 11/21/2018 OT Individual Time: 0900-1015 OT Individual Time Calculation (min): 75 min    Short Term Goals:  Week 5:  OT Short Term Goal 1 (Week 5): Continue progressing toward established LTG     Skilled Therapeutic Interventions/Progress Updates:    Pt received in recliner, ready for tx with OT. Pt c/o pain in R LE and reports he did not sleep well last night d/t muscle cramps and pain in B LE. OT consulted with RN; RN administered pain meds during session. Pt completed stand pivot transfer from recliner <> w/c with min A. Pt completed oral care seated in w/c at sink. OT provided pt with shoe buttons for tennis shoes and demonstrated use to pt. Pt requested not to don shoes this date but verbalized understanding of shoe button use and agreeable to attempting use for shoes. Pt completes seated and supine NMR of R UE with facilitation at elbow and shoulder to improve activation of deltoid, biceps, triceps and wrist extensors. Flat hand paddle was utilized to inhibit composite digit flexion during effortful movements. Improved activation in gravity eliminated and gravity assisted positions. Pt completed functional task in standing with mod A for standing balance. Required mod VC for extending knee and OT provided R knee block for stability. Pt completed ping ping ball cognition task; able to completed 2 trials with rest break in between trials. Activity focused on standing tolerance, standing balance, cognition and WB through R UE in standing. Exited session with pt in w/c, safety belt on and all needs met.   Therapy Documentation Precautions:  Precautions Precautions: Fall Precaution Comments: L crani Restrictions Weight Bearing Restrictions: No       Therapy/Group: Individual Therapy  Addalee Kavanagh 11/21/2018, 12:25 PM

## 2018-11-22 ENCOUNTER — Inpatient Hospital Stay (HOSPITAL_COMMUNITY): Payer: 59

## 2018-11-22 NOTE — Progress Notes (Signed)
Occupational Therapy Session Note  Patient Details  Name: Gregory Moreno MRN: [id number] Date of Birth: 27-Feb-1995  Today's Date: 11/22/2018 OT Individual Time: 1415-1445 OT Individual Time Calculation (min): 30 min    Short Term Goals: Week 1:  OT Short Term Goal 1 (Week 1): Pt will sit EOB/EOM with MOD A during functional dynamic sitting balance task OT Short Term Goal 1 - Progress (Week 1): Not met OT Short Term Goal 2 (Week 1): Pt will sit to stand with MAX A of 1 in stedy OT Short Term Goal 2 - Progress (Week 1): Met OT Short Term Goal 3 (Week 1): Pt will initate washing face with VC only OT Short Term Goal 3 - Progress (Week 1): Met OT Short Term Goal 4 (Week 1): Pt will locate toothbrush with mod multimodal cues for improved visual scanning OT Short Term Goal 4 - Progress (Week 1): Met OT Short Term Goal 5 (Week 1): Pt will completes 1/4 steps of UB dressing OT Short Term Goal 5 - Progress (Week 1): Met  Skilled Therapeutic Interventions/Progress Updates:    1:1. Pt received in w/c agreeable to tx, however needing to toilet. RN present in room administering meds with stedy already set up. Pt sit to stand in stedy with MIN A and transfers into toilet with A for CM after toileting but able ot complete prior. Pt requires increased time for voiding bowel and bladder. Pt requires VC for weight shifting in standing in stedy when completing hygiene. 3 sit to stands with high to low table to R with MIN A and tacltile/verbal and visual (mirror) cues utilized for midline orientation and weight shifting. Exited session with pt seated in recliner, with  Belt alarm on, call light in reach and all needs met  Therapy Documentation Precautions:  Precautions Precautions: Fall Precaution Comments: L crani Restrictions Weight Bearing Restrictions: No General:   Vital Signs: Therapy Vitals Temp: 97.6 F (36.4 C) Temp Source: Oral Pulse Rate: (!) 103 Resp: 19 BP: 140/87 Patient Position  (if appropriate): Sitting Oxygen Therapy SpO2: 100 % O2 Device: Room Air Pain: Pain Assessment Pain Scale: 0-10 Pain Score: 5  Pain Type: Acute pain;Chronic pain Pain Location: Leg Pain Orientation: Right Pain Descriptors / Indicators: Aching Pain Frequency: Intermittent Pain Onset: Gradual Patients Stated Pain Goal: 0 Pain Intervention(s): Medication (See eMAR) ADL: ADL Grooming: Maximal assistance Where Assessed-Grooming: Sitting at sink, Wheelchair Upper Body Bathing: Maximal assistance Where Assessed-Upper Body Bathing: Sitting at sink, Wheelchair Lower Body Bathing: Maximal assistance Where Assessed-Lower Body Bathing: Sitting at sink, Wheelchair Upper Body Dressing: Maximal assistance Where Assessed-Upper Body Dressing: Sitting at sink, Wheelchair Lower Body Dressing: Maximal assistance Where Assessed-Lower Body Dressing: Sitting at sink, Wheelchair Toileting: Unable to assess Toilet Transfer: Dependent(+2) [employer] forces technical officer Method: Charlaine Dalton) Vision   Perception    Praxis   Exercises:   Other Treatments:     Therapy/Group: Individual Therapy  Tonny Branch 11/22/2018, 2:48 PM

## 2018-11-22 NOTE — Progress Notes (Signed)
[facility] PHYSICAL MEDICINE & REHABILITATION PROGRESS NOTE  Subjective/Complaints:  Patient is now verbal.  He is able to tell me his name.  He does not remember what he ate for breakfast.  He does not remember what he did in therapy.  ROS: limited due to language/communication   Objective: Vital Signs: Blood pressure 125/70, pulse 91, temperature 99.1 F (37.3 C), temperature source Oral, resp. rate 20, height 5\' 7"  (1.702 m), weight 66.5 kg, SpO2 98 %. No results found. No results for input(s): WBC, HGB, HCT, PLT in the last 72 hours. No results for input(s): NA, K, CL, CO2, GLUCOSE, BUN, CREATININE, CALCIUM in the last 72 hours.  Physical Exam: BP 125/70 (BP Location: Left Arm)   Pulse 91   Temp 99.1 F (37.3 C) (Oral)   Resp 20   Ht 5\' 7"  (1.702 m)   Wt 66.5 kg   SpO2 98%   BMI 22.96 kg/m  Constitutional: No distress . Vital signs reviewed. HEENT: EOMI, oral membranes moist Neck: supple Cardiovascular: RRR without murmur. No JVD    Respiratory: CTA Bilaterally without wheezes or rales. Normal effort    GI: BS +, non-tender, non-distended  Skin: Warm and dry.  Intact. Psych: alert and smiling/good spirits.  Musc: No edema in extremities.  No tenderness in extremities. Neuro: alert and engaging more. More spontaneous language. Struggles with more complex information Motor:  RUE to minus elbow flexion and trace finger extension and RLE 0-1/5. Mild resting tone. DTR's 2++  Assessment/Plan: 1. 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  Physiatrist is providing close team supervision and 24 hour management of active medical problems listed below.  Physiatrist and rehab team continue to assess barriers to discharge/monitor patient progress toward functional and medical goals  Care Tool:  Bathing    Body parts bathed by patient:  Right arm, Chest, Front perineal area, Abdomen, Right upper leg, Left upper leg, Face, Right lower leg, Left lower leg, Buttocks, Left arm   Body parts bathed by helper: Left arm     Bathing assist Assist Level: Contact Guard/Touching assist     Upper Body Dressing/Undressing Upper body dressing   What is the patient wearing?: Pull over shirt    Upper body assist Assist Level: Contact Guard/Touching assist    Lower Body Dressing/Undressing Lower body dressing      What is the patient wearing?: Pants     Lower body assist Assist for lower body dressing: Moderate Assistance - Patient 50 - 74%     Toileting Toileting    Toileting assist Assist for toileting: Contact Guard/Touching assist     Transfers Chair/bed transfer  Transfers assist  Chair/bed transfer activity did not occur: Safety/medical concerns  Chair/bed transfer assist level: Minimal Assistance - Patient > 75%     Locomotion Ambulation   Ambulation assist   Ambulation activity did not occur: Safety/medical concerns  Assist level: Maximal Assistance - Patient 25 - 49% Assistive device: Other (comment)(rail in hallway) Max distance: 30'   Walk 10 feet activity   Assist  Walk 10 feet activity did not occur: Safety/medical concerns  Assist level: Maximal Assistance - Patient 25 - 49% Assistive device: Other (comment)(rail in hallway)   Walk 50 feet activity   Assist Walk 50 feet with 2 turns activity did not occur: Safety/medical concerns  Assist level: 2 helpers(mod/max assist + w/c follow for safety)  Assistive device: Walker-hemi(R GRAFO, heel wedge)    Walk 150 feet activity   Assist Walk 150 feet activity did not occur: Safety/medical concerns         Walk 10 feet on uneven surface  activity   Assist Walk 10 feet on uneven surfaces activity did not occur: Safety/medical concerns         Wheelchair     Assist Will patient use wheelchair at discharge?: Yes Type of  Wheelchair: Manual Wheelchair activity did not occur: Safety/medical concerns  Wheelchair assist level: Minimal Assistance - Patient > 75% Max wheelchair distance: 150 ft    Wheelchair 50 feet with 2 turns activity    Assist    Wheelchair 50 feet with 2 turns activity did not occur: Safety/medical concerns   Assist Level: Minimal Assistance - Patient > 75%   Wheelchair 150 feet activity     Assist Wheelchair 150 feet activity did not occur: Safety/medical concerns   Assist Level: Minimal Assistance - Patient > 75%      Medical Problem List and Plan: 1.Right greater than left-sided weaknesssecondary to intracerebral hemorrhage with GBM.Surgery was done emergently due to signs of herniation due to notes.Status post left crani for hematoma evacuation and tumor resection 10/13/2018.   Continue CIR therapies, PT OT speech  PRAFO, WHO at night  -Most recent MRI   with inflammatory enhancement of surgical/tumor site. Higher dose steroids initiated. Pt tolerating  -MRI later this month followed XRT and CTX potentially -ongoing direction and involvement of Dr. [name] and palliative care 2. Antithrombotics: -DVT/anticoagulation:Subcutaneous heparin initiated 10/16/2018 -antiplatelet therapy: N/A 3. Pain Management:Tylenol as needed 4. Mood:Provide emotional support -antipsychotic agents: N/A 5. Neuropsych: This patientis not of making decisions on hisown behalf. 6. Skin/Wound Care:Routine skin checks 7. Fluids/Electrolytes/Nutrition:intake 100% 8. Dysphagia. Dysphagia #2 thin liquids. 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 Constipation  Bowel meds as necessary 11. Spasticity  Baclofen 5mg  2 times daily started on 10/9---increased to TID 10/13   -prn  dose added for breakthrough spasms 11/4 12. Hiccups  Baclofen as needed  Improved 13.  Steroid-induced hyperglycemia  Continue to monitor with steroid wean 14.  Hyponatremia  Sodium 135 11/2 15.  Transaminitis  LFTs   stable-recheck Monday 16.  Leukocytosis-  steroid-induced  -10/26- WBC 8k--> 13.9 11/2 as expected  -recheck Monday  UA negative  LOS: 32 days A FACE TO FACE EVALUATION WAS PERFORMED  Charlett Blake 11/22/2018, 10:23 AM

## 2018-11-23 ENCOUNTER — Inpatient Hospital Stay (HOSPITAL_COMMUNITY): Payer: 59 | Admitting: Speech Pathology

## 2018-11-23 ENCOUNTER — Inpatient Hospital Stay (HOSPITAL_COMMUNITY): Payer: 59

## 2018-11-23 NOTE — Progress Notes (Signed)
Occupational Therapy Session Note  Patient Details  Name: Gregory Moreno MRN: [id number] Date of Birth: [birth year]  Today's Date: 11/23/2018 OT Individual Time: 1300-1400 OT Individual Time Calculation (min): 60 min    Short Term Goals: Week 5:  OT Short Term Goal 1 (Week 5): Continue progressing toward established LTG  Skilled Therapeutic Interventions/Progress Updates:    Pt received sitting up in the w/c with mother preset, no c/o pain. Pt was transported to the therapy gym total A for time management. Pt completed functional mobility to the mat with hemi walker, max facilitation of the RLE, mod A overall. Pt transitioned into sidelying on his LUE to position RUE/RLE in gravity eliminated position. Pt completed RLE hip flexion/extension with AAROM, more assist required in hip extension with minimal activation in the glutes/hamstrings. Pt able to complete gravity eliminated hip flexion with proper positioning. With gravity eliminated and AAROM pt able to complete sidelying RUE shoulder extension and flexion. Entire session had focus on extension pattern 2/2 flexion preference/return. Pt then transitioned to supine on mat with bolster support B knees to remove pressure off lower back. Using slideboard pt able to complete closed chain BUE extension. Pt able to maintain RUE extension with R shoulder in 90 degrees flex and not any lower. Pt required max facilitation to maintain finger extension on board. Resistance applied to top of board to challenge serratus anterior. Pt returned to sitting EOM with (S). Pt able to complete 2 sets of core stability/strengthening exercises. Pt returned to the w/c with moderate LOB d/t premature weight shift over the RLE. Pt was returned to the room and left sitting up with his mother present.   Therapy Documentation Precautions:  Precautions Precautions: Fall Precaution Comments: L crani Restrictions Weight Bearing Restrictions: No   Therapy/Group:  Individual Therapy  [name] 11/23/2018, 7:18 AM

## 2018-11-23 NOTE — Progress Notes (Signed)
[place] PHYSICAL MEDICINE & REHABILITATION PROGRESS NOTE  Subjective/Complaints:  Patient is now verbal.  Patient can state his mom is sitting next to him but does not remember her name.  ROS: limited due to language/communication   Objective: Vital Signs: Blood pressure 116/70, pulse 84, temperature 98.7 F (37.1 C), temperature source Oral, resp. rate 20, height 5\' 7"  (1.702 m), weight 66.5 kg, SpO2 95 %. No results found. No results for input(s): WBC, HGB, HCT, PLT in the last 72 hours. No results for input(s): NA, K, CL, CO2, GLUCOSE, BUN, CREATININE, CALCIUM in the last 72 hours.  Physical Exam: BP 116/70 (BP Location: Left Arm)   Pulse 84   Temp 98.7 F (37.1 C) (Oral)   Resp 20   Ht 5\' 7"  (1.702 m)   Wt 66.5 kg   SpO2 95%   BMI 22.96 kg/m  Constitutional: No distress . Vital signs reviewed. HEENT: EOMI, oral membranes moist Neck: supple Cardiovascular: RRR without murmur. No JVD    Respiratory: CTA Bilaterally without wheezes or rales. Normal effort    GI: BS +, non-tender, non-distended  Skin: Warm and dry.  Intact. Psych: alert and smiling/good spirits.  Musc: No edema in extremities.  No tenderness in extremities. Neuro: alert and engaging more. More spontaneous language. Struggles with more complex information Motor:  RUE 2 minus elbow flexion and trace finger extension and RLE 0-1/5.  Difficulty following multistep commands, tried moving his right upper extremity when asked to move his left upper extremity.  Mild resting tone. DTR's 2++  Assessment/Plan: 1. 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  Physiatrist is providing close team supervision and 24 hour management of active medical problems listed below.  Physiatrist and rehab team continue to assess barriers to discharge/monitor patient progress toward functional and  medical goals  Care Tool:  Bathing    Body parts bathed by patient: Right arm, Chest, Front perineal area, Abdomen, Right upper leg, Left upper leg, Face, Right lower leg, Left lower leg, Buttocks, Left arm   Body parts bathed by helper: Left arm     Bathing assist Assist Level: Contact Guard/Touching assist     Upper Body Dressing/Undressing Upper body dressing   What is the patient wearing?: Pull over shirt    Upper body assist Assist Level: Contact Guard/Touching assist    Lower Body Dressing/Undressing Lower body dressing      What is the patient wearing?: Pants     Lower body assist Assist for lower body dressing: Moderate Assistance - Patient 50 - 74%     Toileting Toileting    Toileting assist Assist for toileting: Contact Guard/Touching assist     Transfers Chair/bed transfer  Transfers assist  Chair/bed transfer activity did not occur: Safety/medical concerns  Chair/bed transfer assist level: Minimal Assistance - Patient > 75%     Locomotion Ambulation   Ambulation assist   Ambulation activity did not occur: Safety/medical concerns  Assist level: 2 helpers Assistive device: Other (comment)(3 musketeers) Max distance: 30'   Walk 10 feet activity   Assist  Walk 10 feet activity did not occur: Safety/medical concerns  Assist level: 2 helpers Assistive device: Other (comment)(3 musketeers)   Walk 50 feet activity   Assist Walk 50 feet with 2 turns activity did not occur: Safety/medical concerns  Assist level: 2 helpers(mod/max assist + w/c follow for safety) Assistive device: Walker-hemi(R GRAFO,  heel wedge)    Walk 150 feet activity   Assist Walk 150 feet activity did not occur: Safety/medical concerns         Walk 10 feet on uneven surface  activity   Assist Walk 10 feet on uneven surfaces activity did not occur: Safety/medical concerns         Wheelchair     Assist Will patient use wheelchair at discharge?:  Yes Type of Wheelchair: Manual Wheelchair activity did not occur: Safety/medical concerns  Wheelchair assist level: Minimal Assistance - Patient > 75% Max wheelchair distance: 150 ft    Wheelchair 50 feet with 2 turns activity    Assist    Wheelchair 50 feet with 2 turns activity did not occur: Safety/medical concerns   Assist Level: Minimal Assistance - Patient > 75%   Wheelchair 150 feet activity     Assist Wheelchair 150 feet activity did not occur: Safety/medical concerns   Assist Level: Minimal Assistance - Patient > 75%      Medical Problem List and Plan: 1.Right greater than left-sided weaknesssecondary to intracerebral hemorrhage with GBM.Surgery was done emergently due to signs of herniation due to notes.Status post left crani for hematoma evacuation and tumor resection 10/13/2018.   Continue CIR therapies, PT OT speech  PRAFO, WHO at night  -Most recent MRI   with inflammatory enhancement of surgical/tumor site. Higher dose steroids initiated. Pt tolerating  -MRI later this month followed XRT and CTX potentially -ongoing direction and involvement of Dr. [name] and palliative care 2. Antithrombotics: -DVT/anticoagulation:Subcutaneous heparin initiated 10/16/2018 -antiplatelet therapy: N/A 3. Pain Management:Tylenol as needed 4. Mood:Provide emotional support -antipsychotic agents: N/A 5. Neuropsych: This patientis not of making decisions on hisown behalf. 6. Skin/Wound Care:Routine skin checks 7. Fluids/Electrolytes/Nutrition:intake 100% 8. Dysphagia. Dysphagia #2 thin liquids. 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 Constipation  Bowel meds as necessary 11. Spasticity  Baclofen 5mg  2 times daily started on 10/9---increased to TID  10/13   -prn dose added for breakthrough spasms 11/4 12. Hiccups  Baclofen as needed  Improved 13.  Steroid-induced hyperglycemia  Continue to monitor with steroid wean 14.  Hyponatremia  Sodium 135 11/2 15.  Transaminitis  LFTs   stable-recheck Monday 16.  Leukocytosis-  steroid-induced recheck in a.m.  -10/26- WBC 8k--> 13.9 11/2 as expected   UA negative  LOS: 33 days A FACE TO FACE EVALUATION WAS PERFORMED  Charlett Blake 11/23/2018, 11:18 AM

## 2018-11-23 NOTE — Progress Notes (Signed)
Physical Therapy Session Note  Patient Details  Name: Gregory Moreno MRN: [id number] Date of Birth: 27-Feb-1995  Today's Date: 11/23/2018 PT Individual Time: 0802-0900 PT Individual Time Calculation (min): 58 min   Short Term Goals: Week 5:  PT Short Term Goal 1 (Week 5): STG = LTG due to estimated d/c date.  Skilled Therapeutic Interventions/Progress Updates:    Pt supine in bed upon PT arrival, agreeable to therapy tx and denies pain at rest. Therapist assisted to loop LEs through shorts, facilitated R LE placement for hooklying, pt performed bridging to pull pants over hips. Donned shoes/socks total assist for time management this session. Pt transferred to sitting EOB with min assist, stand pivot to the w/c with mod assist and cues for techniques/hand placement. Pt transported to the gym. Pt performed squat pivot to the mat with CGA, cues for hand placement and techniques. Pt performed sit<>stands this session from edge of mat x 5 throughout session without AD and with mod assist, in standing facilitation for increased R hip extension, facilitation of R knee to limit hyperextension/limit increased knee flexion. In standing pt performed x 5 mini squats without UE support, mod assist with emphasis on R hip/knee extension in standing. Pt worked on pre-gait stepping in place with L LE with L UE support HHA from second helper, cues for step length and foot placement working on R LE weightbearing and stance control. Donned R LE GRAFO total assist. Pt ambulated x 30 ft this session with mod assist +2, 3 musketeers technique with therapist facilitating R hip extension and limiting R knee hyperextension during stance, pt initiating R LE swing with facilitation/assist for swing through/advancement and foot placement. Pt transferred from w/c>tall kneeling with bench for UE support, mod assist +2 to get in/out of position. In tall kneeling pt performed x 10 hip extensions with facilitation and cues for full R  hip extension. In tall kneeling pt worked on R LE weightbearing/NMR and R hip extension while performing L LE knee sidesteps in place 2 x 10, with mod-max assist for maintaining full R hip extension. Pt transported back to room at end of session and left in w/c with needs in reach and chair alarm set.   Therapy Documentation Precautions:  Precautions Precautions: Fall Precaution Comments: L crani Restrictions Weight Bearing Restrictions: No    Therapy/Group: Individual Therapy  Netta Corrigan, PT, DPT, CSRS 11/23/2018, 9:13 AM

## 2018-11-23 NOTE — Progress Notes (Signed)
Speech Language Pathology Daily Session Note  Patient Details  Name: Gregory Moreno MRN: [id number] Date of Birth: 1995-07-07  Today's Date: 11/23/2018 SLP Individual Time: [id number] SLP Individual Time Calculation (min): 42 min  Short Term Goals: Week 5: SLP Short Term Goal 1 (Week 5): STGs=LTGs due to ELOS  Skilled Therapeutic Interventions: Pt was seen for skilled ST targeting communication goals. SLP facilitated session with overall Max A semantic and phonemic cues when naming familiar common objects. Decreased Mod A phonemic cues provided for pt to read at the word level. Cueing decreased slightly with repetition of naming/reading the same 10 objects. Pt became mildly perseverative on the word "spark", however redirectable with phonemic cues. Pt was left sitting in wheelchair with seatbelt alarm engaged and family member present. Continue per current plan of care.       Pain Pain Assessment Pain Score: 0-No pain  Therapy/Group: Individual Therapy  Arbutus Leas 11/23/2018, 12:09 PM

## 2018-11-24 ENCOUNTER — Inpatient Hospital Stay (HOSPITAL_COMMUNITY): Payer: 59

## 2018-11-24 ENCOUNTER — Inpatient Hospital Stay (HOSPITAL_COMMUNITY): Payer: 59 | Admitting: Speech Pathology

## 2018-11-24 ENCOUNTER — Inpatient Hospital Stay (HOSPITAL_COMMUNITY): Payer: 59 | Admitting: Physical Therapy

## 2018-11-24 MED ORDER — GADOBUTROL 1 MMOL/ML IV SOLN
6.0000 mL | Freq: Once | INTRAVENOUS | Status: AC | PRN
  Administered 2018-11-24: 13:00:00 6 mL via INTRAVENOUS

## 2018-11-24 NOTE — Progress Notes (Signed)
Speech Language Pathology Daily Session Note  Patient Details  Name: Gregory Moreno MRN: [id number] Date of Birth: [birth year]  Today's Date: 11/24/2018  Session 1: SLP Individual Time: 0830-0900 SLP Individual Time Calculation (min): 30 min   Session 2: SLP Individual Time: [id number] SLP Individual Time Calculation (min): 30 min  Short Term Goals: Week 5: SLP Short Term Goal 1 (Week 5): STGs=LTGs due to ELOS  Skilled Therapeutic Interventions:  Session 1: Skilled treatment session focused on language goals. SLP facilitated session by providing Mod A semantic and phonemic cues for naming of 10 familiar items, however, cueing decreased with repetition. Decreased cues were needed while reading aloud at the word level with patient requiring only Min A verbal cues to self-monitor and correct phonemic paraphasias with increased awareness of errors as well. Patient left upright in bed with alarm on and all needs within reach. Continue with current plan of care.   Session 2: Skilled treatment session focused on dysphagia goals and family education with the patient's father. SLP facilitated session by providing skilled observation with lunch meal of regular textures with thin liquids. Patient consumed meal without overt s/s of aspiration but required supervision level verbal cues for small bites and problem solving with tray set-up. Patient's father present and SLP initiated family education in regards to patient's current cognitive-linguistic function and strategies to maximize verbal expression and error correction. He verbalized understanding but will need more education to reinforce strategies. Patient left upright in wheelchair with all needs within reach and father present Continue with current plan of care.   Pain No/Denies Pain   Therapy/Group: Individual Therapy  Jamaury Gumz 11/24/2018, 12:36 PM

## 2018-11-24 NOTE — Progress Notes (Signed)
[place] PHYSICAL MEDICINE & REHABILITATION PROGRESS NOTE  Subjective/Complaints:  Up watching the news. Denies pain. Tells me he feels ok.   ROS: Limited due to cognitive/behavioral    Objective: Vital Signs: Blood pressure 117/72, pulse 82, temperature 98.3 F (36.8 C), temperature source Oral, resp. rate 18, height 5\' 7"  (1.702 m), weight 66.5 kg, SpO2 100 %. No results found. No results for input(s): WBC, HGB, HCT, PLT in the last 72 hours. No results for input(s): NA, K, CL, CO2, GLUCOSE, BUN, CREATININE, CALCIUM in the last 72 hours.  Physical Exam: BP 117/72 (BP Location: Left Arm)   Pulse 82   Temp 98.3 F (36.8 C) (Oral)   Resp 18   Ht 5\' 7"  (1.702 m)   Wt 66.5 kg   SpO2 100%   BMI 22.96 kg/m  Constitutional: No distress . Vital signs reviewed. HEENT: EOMI, oral membranes moist Neck: supple Cardiovascular: RRR without murmur. No JVD    Respiratory: CTA Bilaterally without wheezes or rales. Normal effort    GI: BS +, non-tender, non-distended  Skin: Warm and dry.  Intact. Psych: alert and smiling/good spirits.  Musc: No edema in extremities.  No tenderness in extremities. Neuro: alert, more verbal today.  Motor:  RUE 1-2/5 prox to tr 1/5 distally and RLE tr-1/5.  Processing delays.   Mild resting tone R.  DTR's 2++  Assessment/Plan: 1. 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  Physiatrist is providing close team supervision and 24 hour management of active medical problems listed below.  Physiatrist and rehab team continue to assess barriers to discharge/monitor patient progress toward functional and medical goals  Care Tool:  Bathing    Body parts bathed by patient: Right arm, Chest, Front perineal area, Abdomen, Right upper leg, Left upper leg, Face, Right lower leg, Left lower leg, Buttocks, Left arm   Body parts  bathed by helper: Left arm     Bathing assist Assist Level: Contact Guard/Touching assist     Upper Body Dressing/Undressing Upper body dressing   What is the patient wearing?: Pull over shirt    Upper body assist Assist Level: Contact Guard/Touching assist    Lower Body Dressing/Undressing Lower body dressing      What is the patient wearing?: Pants     Lower body assist Assist for lower body dressing: Moderate Assistance - Patient 50 - 74%     Toileting Toileting    Toileting assist Assist for toileting: Contact Guard/Touching assist     Transfers Chair/bed transfer  Transfers assist  Chair/bed transfer activity did not occur: Safety/medical concerns  Chair/bed transfer assist level: Moderate Assistance - Patient 50 - 74%     Locomotion Ambulation   Ambulation assist   Ambulation activity did not occur: Safety/medical concerns  Assist level: 2 helpers Assistive device: Other (comment)(3 musketeers) Max distance: 30'   Walk 10 feet activity   Assist  Walk 10 feet activity did not occur: Safety/medical concerns  Assist level: 2 helpers Assistive device: Other (comment)(3 musketeers)   Walk 50 feet activity   Assist Walk 50 feet with 2 turns activity did not occur: Safety/medical concerns  Assist level: 2 helpers(mod/max assist + w/c follow for safety) Assistive device: Walker-hemi(R GRAFO, heel wedge)    Walk 150 feet activity   Assist Walk 150 feet activity did not occur: Safety/medical concerns  Walk 10 feet on uneven surface  activity   Assist Walk 10 feet on uneven surfaces activity did not occur: Safety/medical concerns         Wheelchair     Assist Will patient use wheelchair at discharge?: Yes Type of Wheelchair: Manual Wheelchair activity did not occur: Safety/medical concerns  Wheelchair assist level: Minimal Assistance - Patient > 75% Max wheelchair distance: 150 ft    Wheelchair 50 feet with 2 turns  activity    Assist    Wheelchair 50 feet with 2 turns activity did not occur: Safety/medical concerns   Assist Level: Minimal Assistance - Patient > 75%   Wheelchair 150 feet activity     Assist Wheelchair 150 feet activity did not occur: Safety/medical concerns   Assist Level: Minimal Assistance - Patient > 75%      Medical Problem List and Plan: 1.Right greater than left-sided weaknesssecondary to intracerebral hemorrhage with GBM.Surgery was done emergently due to signs of herniation due to notes.Status post left crani for hematoma evacuation and tumor resection 10/13/2018.   Continue CIR therapies, PT OT speech  PRAFO, WHO at night  -Most recent MRI   with inflammatory enhancement of surgical/tumor site. Higher dose steroids initiated. Pt tolerating at this point  -MRI later this month followed XRT and CTX potentially -ongoing direction and involvement of Dr. [name] and palliative care 2. Antithrombotics: -DVT/anticoagulation:Subcutaneous heparin initiated 10/16/2018 -antiplatelet therapy: N/A 3. Pain Management:Tylenol as needed 4. Mood:Provide emotional support -antipsychotic agents: N/A 5. Neuropsych: This patientis not of making decisions on hisown behalf. 6. Skin/Wound Care:Routine skin checks 7. Fluids/Electrolytes/Nutrition:intake 100% 8. Dysphagia. Dysphagia #2 thin liquids. Follow-up speech therapy  Advance diet as tolerated 9. 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 Constipation  Bowel meds as necessary 11. Spasticity  Baclofen 5mg  2 times daily started on 10/9---increased to TID 10/13   -prn dose added for breakthrough spasms 11/4   -improved control   -orthotics 12. Hiccups  Baclofen as needed  Improved 13.  Steroid-induced hyperglycemia  Continue to monitor with steroid wean 14.  Hyponatremia  Sodium  135 11/2 15.  Transaminitis  LFTs   stable-recheck later this week 16.  Leukocytosis-  Steroid induced.  -10/26- WBC 8k--> 13.9 11/2 as expected      LOS: 34 days A FACE TO FACE EVALUATION WAS PERFORMED  Meredith Staggers 11/24/2018, 9:19 AM

## 2018-11-24 NOTE — Progress Notes (Addendum)
Physical Therapy Session Note  Patient Details  Name: Gregory Moreno MRN: [id number] Date of Birth: 10-31-1995  Today's Date: 11/24/2018 PT Individual Time: 1305-1400 PT Individual Time Calculation (min): 55 min   Short Term Goals: Week 5:  PT Short Term Goal 1 (Week 5): STG = LTG due to estimated d/c date.  Skilled Therapeutic Interventions/Progress Updates:  Therapist entered room with pt arriving from MRI & pt agreeable to tx. Pt transfers supine>sitting EOB with CGA and therapist assists pt with donning shirt, shorts, R AFO, & shoes for time management. Pt transfers sit>stand from EOB with mod assist but requires mod/max for standing balance. [name] father assisted pt with stand pivot to w/c on L with good safety overall. Transported pt outside where he practiced real car transfer x 4 trials w/c<>[employer] with father assisting pt. During first trial pt & father required cuing for pt to place LLE in/out of car and scoot back on car seat but for remainder of transfers pt & father were able to complete transfer with little to no cuing from therapist. Pt then returned to unit & pt propels w/c nurses station>room with L hemi technique & min assist fade to supervision with cuing to attend to R as pt initially frequently running into wall, and pt with ongoing anterior weight shift but no LOB forward out of chair (therapist attempts to cue pt to sit upright but pt continues to lean forward). At end of session pt left in w/c with father present to supervise, set up with meal tray, & SLP entering room.   Therapy Documentation Precautions:  Precautions Precautions: Fall Precaution Comments: L crani Restrictions Weight Bearing Restrictions: No  Pain: No c/o pain during session.   Therapy/Group: Individual Therapy  [name] 11/24/2018, 3:53 PM

## 2018-11-24 NOTE — Progress Notes (Signed)
Occupational Therapy Session Note  Patient Details  Name: Gregory Moreno MRN: [id number] Date of Birth: 1995/04/13  Today's Date: 11/24/2018 OT Individual Time: 1000-1115 OT Individual Time Calculation (min): 75 min    Short Term Goals: Week 5:  OT Short Term Goal 1 (Week 5): Continue progressing toward established LTG  Skilled Therapeutic Interventions/Progress Updates:    Pt received sitting up in the w/c with no c/o pain, agreeable to session. Pt completed several core stability and BUE integration/neuromuscular control activities in supported quadruped, with activity graded with UE weightbearing through elbows and extended arms. Pt required as much as mod A to maintain position and to weight shift over RUE/RLE. Pt required rest breaks and transitioned into prone. In prone pt completed supported scapular push ups with mod cueing for motor planning. Pt sat EOM and completed core stability exercises with trunk flexion/ext and lateral flexion with CGA. Pt returned to his room and was left sitting up with all needs met, father present.   Therapy Documentation Precautions:  Precautions Precautions: Fall Precaution Comments: L crani Restrictions Weight Bearing Restrictions: No   Therapy/Group: Individual Therapy  [name] 11/24/2018, 7:14 AM

## 2018-11-25 ENCOUNTER — Telehealth: Payer: Self-pay | Admitting: Pharmacist

## 2018-11-25 ENCOUNTER — Inpatient Hospital Stay (HOSPITAL_COMMUNITY): Payer: 59 | Admitting: Physical Therapy

## 2018-11-25 ENCOUNTER — Other Ambulatory Visit: Payer: Self-pay | Admitting: Internal Medicine

## 2018-11-25 ENCOUNTER — Inpatient Hospital Stay (HOSPITAL_COMMUNITY): Payer: 59 | Admitting: Speech Pathology

## 2018-11-25 ENCOUNTER — Inpatient Hospital Stay (HOSPITAL_COMMUNITY): Payer: 59

## 2018-11-25 DIAGNOSIS — C712 Malignant neoplasm of temporal lobe: Secondary | ICD-10-CM

## 2018-11-25 MED ORDER — ONDANSETRON HCL 8 MG PO TABS: 8.0000 mg | ORAL_TABLET | Freq: Two times a day (BID) | ORAL | 1 refills | Status: DC | PRN

## 2018-11-25 MED ORDER — TEMOZOLOMIDE 20 MG PO CAPS: 20.0000 mg | ORAL_CAPSULE | Freq: Every day | ORAL | 0 refills | Status: DC

## 2018-11-25 MED ORDER — TEMOZOLOMIDE 100 MG PO CAPS: 100.0000 mg | ORAL_CAPSULE | Freq: Every day | ORAL | 0 refills | Status: DC

## 2018-11-25 NOTE — Telephone Encounter (Signed)
Reviewed recent MRI results and recommended treatment plan with Mr. Oshel father.    We discussed plan for CT-simulation tomorrow and hopeful start of therapy on Monday.  We also reviewed indication and plan for concurrent dosing of Temozolomide at 75mg /m2 daily during radiation.    Discussed side effects including cytopenias, fatigue, constipation and nausea/vomiting.    Chemotherapy should be held for the following:  ANC less than 1,000  Platelets less than 100,000  LFT or creatinine greater than 2x ULN  If clinical concerns/contraindications develop  He will pre-treat with 8mg  zofran 30-76min prior to chemo each night. Will recommend phone consultation with oral chemo pharmacist prior to Monday for further education.  Ventura Sellers, MD

## 2018-11-25 NOTE — Progress Notes (Signed)
  Patient ID: Gregory Moreno, male   DOB: 1995/03/07, 23 y.o.   MRN: [id number]  Diagnosis codes:  C71.2;  I61.9;  G81.11)  Height:  5'8"  Weight:  145 lbs    Patient suffers from glioblastoma brain tumor and intercerebral hemorrhage which impairs his ability to perform daily activities like bathing, dressing and toileting in the home.  A walker or cane will not resolve the issues with performing these ADLs.  A high strength lightweight wheelchair will allow patient to safely perform daily activities.  This wheelchair will be used primarily in the home setting.  The patient is not able to propel themselves in the home using a standard or lightweight wheelchair due to arm weakness and endurance.  Patient can self propel in the high strength lightweight wheelchair.  I have personally performed a face to face evaluation of this patient and recommend this wheelchair.  Marlowe Shores, PA-C

## 2018-11-25 NOTE — Progress Notes (Signed)
Physical Therapy Session Note  Patient Details  Name: Gregory Moreno MRN: [id number] Date of Birth: 05-10-1995  Today's Date: 11/25/2018 PT Individual Time: 1100-1155 PT Individual Time Calculation (min): 55 min   Short Term Goals: Week 5:  PT Short Term Goal 1 (Week 5): STG = LTG due to estimated d/c date.  Skilled Therapeutic Interventions/Progress Updates:   Pt in recliner and agreeable to therapy, no c/o pain. Total assist w/c transport to/from outside of hospital to practice real car transfer w/ pt's mom. She has previously practiced stand pivots w/ pt to/from mat and is familiar w/ hands-on technique. Mom performed hands-on car transfer w/o physical or verbal assist from this therapist, she gave good verbal and tactile cues as well as managed RLE well. Returned to unit and practiced gait w/ hemiwalker. Ambulated 50' x3 reps w/ hemiwalker and mod assist overall for lateral weight shifting and RLE placement. Orthotist rep present and both agreed that R extension thrust in stance minimized w/ longer RLE steps. Pt able to take small steps w/o assist, needs manual assist for larger, more functional steps. Verbal and visual cues for hemiwalker management as well. Pre-gait weight shifting in front of mirror to work on L lateral weight shifting w/ RLE stepping. Afterwards, pt able to perform lateral weight shifting w/ only light mod assist. Returned to room and ended session in w/c, all needs in reach.   Therapy Documentation Precautions:  Precautions Precautions: Fall Precaution Comments: L crani Restrictions Weight Bearing Restrictions: No  Therapy/Group: Individual Therapy  Sarahelizabeth Conway K Dorrene Bently 11/25/2018, 12:03 PM

## 2018-11-25 NOTE — Telephone Encounter (Signed)
Oral Oncology Pharmacist Encounter  Received new prescription for Temodar (temozolomide) for the treatment of newly diagnosed glioblastoma multiforme in conjunction with radiation, planned duration 6 weeks of concurrent chemoradiation. Per MD, planned start date 12/01/18, radiation schedule not yet set. Noted patient currently admitted.  Temozolomide is planned to be administered at ~75 mg/m2 (120 mg) by mouth once daily for 42 days during concurrent phase  Labs from Epic assessed, OK for treatment initiation.  Current medication list in Epic reviewed, no DDIs with temozolomide identified.  Prescription has been e-scribed to the [facility] for benefits analysis and approval by MD.  Oral Oncology Clinic will continue to follow for insurance authorization, copayment issues, initial counseling and start date.  Johny Drilling, PharmD, BCPS, BCOP  11/25/2018 4:16 PM Oral Oncology Clinic [phone]

## 2018-11-25 NOTE — Progress Notes (Signed)
START ON PATHWAY REGIMEN - Neuro     One cycle, daily for 42 days concurrent with RT:     Temozolomide   **Always confirm dose/schedule in your pharmacy ordering system**  Patient Characteristics: Glioblastoma (Grade IV Glioma), Newly Diagnosed / Treatment Naive, Good Performance Status and/or Younger Patient, MGMT Promoter Unmethylated/Unknown Disease Classification: Glioma Disease Classification: Glioblastoma (Grade IV Glioma) Disease Status: Newly Diagnosed / Treatment Naive Performance Status: Good Performance Status and/or Younger Patient MGMT Promoter Methylation Status: Awaiting Test Results Intent of Therapy: Non-Curative / Palliative Intent, Discussed with Patient 

## 2018-11-25 NOTE — Telephone Encounter (Signed)
Oral Oncology Pharmacist Encounter  Insurance authorization submitted to [employer] commercial insurance on Cover My Meds  20mg  capsules Key: [id number] Status: pending  100mg  capsules Key: [id number] Status: pending  This encounter will continue to be updated until final determination.  Johny Drilling, PharmD, BCPS, BCOP  11/25/2018   4:21 PM Oral Oncology Clinic [phone]

## 2018-11-25 NOTE — Progress Notes (Signed)
[place] PHYSICAL MEDICINE & REHABILITATION PROGRESS NOTE  Subjective/Complaints:  Sitting up in chair. Looking at phone. No new complaints. Denies pain.   ROS: Limited due to cognitive/behavioral    Objective: Vital Signs: Blood pressure 122/72, pulse 87, temperature 98.2 F (36.8 C), temperature source Oral, resp. rate 18, height 5\' 7"  (1.702 m), weight 66.5 kg, SpO2 99 %. [name] Wo Contrast  Result Date: 11/24/2018 CLINICAL DATA:  Glioblastoma left temporal lobe.  Postop resection. EXAM: MRI HEAD WITHOUT AND WITH CONTRAST TECHNIQUE: Multiplanar, multiecho pulse sequences of the brain and surrounding structures were obtained without and with intravenous contrast. CONTRAST:  31mL GADAVIST GADOBUTROL 1 MMOL/ML IV SOLN COMPARISON:  MRI head 11/11/2018, 10/14/2018, 10/13/2018 FINDINGS: Brain: Postop resection of large mass in the left temporal lobe. Contracting encephalomalacia in the surgical site. Hemosiderin lined surgical cavity. Masslike enlargement of the left medial temporal lobe measuring 18 mm in diameter. This does not enhance and most likely is residual tumor. Much of the original tumor did not enhance. 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 Improvement in cortical enhancement in the left inferior occipital lobe extending into the posterior temporal lobe on the left. Infarct also involves the left thalamus particularly the pulvinar. Small area of infarct in the splenium of the corpus callosum continues to show restricted diffusion. Ventricle size normal.  No midline shift. Vascular: Normal arterial flow voids Skull and upper cervical spine: Left temporal craniotomy. Sinuses/Orbits: Negative Other: None IMPRESSION: Further contraction of the surgical resection cavity in left temporal lobe. 18 mm masslike enlargement left medial temporal lobe compatible with residual nonenhancing tumor Resolving left posterior cerebral artery infarct. Electronically  Signed   By: Franchot Gallo M.D.   On: 11/24/2018 13:11   No results for input(s): WBC, HGB, HCT, PLT in the last 72 hours. No results for input(s): NA, K, CL, CO2, GLUCOSE, BUN, CREATININE, CALCIUM in the last 72 hours.  Physical Exam: BP 122/72 (BP Location: Left Arm)   Pulse 87   Temp 98.2 F (36.8 C) (Oral)   Resp 18   Ht 5\' 7"  (1.702 m)   Wt 66.5 kg   SpO2 99%   BMI 22.96 kg/m  Constitutional: No distress . Vital signs reviewed. HEENT: EOMI, oral membranes moist Neck: supple Cardiovascular: RRR without murmur. No JVD    Respiratory: CTA Bilaterally without wheezes or rales. Normal effort    GI: BS +, non-tender, non-distended  Skin: Warm and dry.  Intact. Psych: alert and smiling/good spirits.  Musc: No edema in extremities.  No tenderness in extremities. Neuro: alert, improving language, talking in phrases and sentences, word subsituttion, seems to be comprehending a lot more.  Motor:  RUE 1-2/5 prox to tr 1/5 distally and RLE tr-1/5.   Tone tr/4 R.  DTR's 2++  Assessment/Plan: 1. 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  Physiatrist is providing close team supervision and 24 hour management of active medical problems listed below.  Physiatrist and rehab team continue to assess barriers to discharge/monitor patient progress toward functional and medical goals  Care Tool:  Bathing    Body parts bathed by patient: Right arm, Chest, Front perineal area, Abdomen, Right upper leg, Left upper leg, Face, Right lower leg, Left lower leg, Buttocks, Left arm   Body parts bathed by helper: Left arm     Bathing assist Assist Level: Contact Guard/Touching  assist     Upper Body Dressing/Undressing Upper body dressing   What is the patient wearing?: Pull over shirt    Upper body assist Assist Level: Contact Guard/Touching assist    Lower  Body Dressing/Undressing Lower body dressing      What is the patient wearing?: Pants     Lower body assist Assist for lower body dressing: Moderate Assistance - Patient 50 - 74%     Toileting Toileting    Toileting assist Assist for toileting: Contact Guard/Touching assist     Transfers Chair/bed transfer  Transfers assist  Chair/bed transfer activity did not occur: Safety/medical concerns  Chair/bed transfer assist level: Moderate Assistance - Patient 50 - 74%     Locomotion Ambulation   Ambulation assist   Ambulation activity did not occur: Safety/medical concerns  Assist level: 2 helpers Assistive device: Other (comment)(3 musketeers) Max distance: 30'   Walk 10 feet activity   Assist  Walk 10 feet activity did not occur: Safety/medical concerns  Assist level: 2 helpers Assistive device: Other (comment)(3 musketeers)   Walk 50 feet activity   Assist Walk 50 feet with 2 turns activity did not occur: Safety/medical concerns  Assist level: 2 helpers(mod/max assist + w/c follow for safety) Assistive device: Walker-hemi(R GRAFO, heel wedge)    Walk 150 feet activity   Assist Walk 150 feet activity did not occur: Safety/medical concerns         Walk 10 feet on uneven surface  activity   Assist Walk 10 feet on uneven surfaces activity did not occur: Safety/medical concerns         Wheelchair     Assist Will patient use wheelchair at discharge?: Yes Type of Wheelchair: Manual Wheelchair activity did not occur: Safety/medical concerns  Wheelchair assist level: Minimal Assistance - Patient > 75%, Supervision/Verbal cueing Max wheelchair distance: 100 ft    Wheelchair 50 feet with 2 turns activity    Assist    Wheelchair 50 feet with 2 turns activity did not occur: Safety/medical concerns   Assist Level: Minimal Assistance - Patient > 75%, Supervision/Verbal cueing   Wheelchair 150 feet activity     Assist Wheelchair 150  feet activity did not occur: Safety/medical concerns   Assist Level: Minimal Assistance - Patient > 75%      Medical Problem List and Plan: 1.Right greater than left-sided weaknesssecondary to intracerebral hemorrhage with GBM.Surgery was done emergently due to signs of herniation due to notes.Status post left crani for hematoma evacuation and tumor resection 10/13/2018.   Continue CIR therapies, PT OT speech--team conference today  -spoke to [facility] medical director who denied further days on rehab---working on an appeal to allow for family ed. Original date 11/13  PRAFO, WHO at night  -Yesterday's MRI with improved post-operative changes, 60mm residual tumor  -Plan is for  XRT and CTX per Rad-Onc, Dr. [name]   2. Antithrombotics: -DVT/anticoagulation:Subcutaneous heparin initiated 10/16/2018 -antiplatelet therapy: N/A 3. Pain Management:Tylenol as needed 4. Mood:Provide emotional support -antipsychotic agents: N/A 5. Neuropsych: This patientis not of making decisions on hisown behalf. 6. Skin/Wound Care:Routine skin checks 7. Fluids/Electrolytes/Nutrition:intake 100% 8. Dysphagia. Dysphagia #2 thin liquids. Follow-up speech therapy  Advance diet as tolerated 9. 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 Constipation  Bowel meds as necessary 11. Spasticity  Baclofen 5mg  2 times daily started on 10/9---increased to TID 10/13   -prn  dose added for breakthrough spasms 11/4   -improved control   -continue resting orthotics 12. Hiccups  Baclofen as needed  Improved 13.  Steroid-induced hyperglycemia  Continue to monitor with steroid wean 14.  Hyponatremia  Sodium 135 11/2 15.  Transaminitis  LFTs   stable-recheck later this week 16.  Leukocytosis-  Steroid induced.  -10/26- WBC 8k--> 13.9 11/2 as expected      LOS: 35 days A FACE TO  [place] 11/25/2018, 9:23 AM

## 2018-11-25 NOTE — Patient Care Conference (Signed)
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 Palliative care by specialist   . 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 Spastic hemiparesis ([place])   . Cerebral edema ([facility])   . 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 Steroid-induced hyperglycemia   . Spastic hemiplegia affecting nondominant side ([place])   . Hyponatremia   . Transaminitis   . Leucocytosis   . Right spastic hemiplegia ([place]) 10/21/2018  . Dysphagia 10/21/2018  . 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 Brain mass   . ICH (intracerebral hemorrhage) ([facility]) 10/13/2018    Expected Discharge Date: Expected Discharge Date: 11/26/18(DC 11/10 versus 11/11)  Team Members Present: Physician leading conference: Dr. Alger Simons Social Worker Present: Lennart Pall, LCSW Nurse Present: Dorien Chihuahua, RN Case Manager: Karene Fry PT Present: Burnard Bunting, PT OT Present: Laverle Hobby, OT SLP Present: Weston Anna, SLP PPS Coordinator present : Gunnar Fusi, SLP     Current Status/Progress Goal Weekly Team Focus  Bowel/Bladder   continent of b/b; LBM: 11/09  remain continent of b/b  assist with tolieting needs   Swallow/Nutrition/ Hydration   Regular textures with thin liquids, intermittent supervision  Supervision  Goals Met   ADL's   (S) UB dressing, mod A LB dressing, min A transfers, family education ongoing  min A overall  RUE NMR, dynamic sitting and standing balance, family education, ADL retraining   Mobility   min assist bed mobility, mod assist transfers, mod/max  assist gait up to 60 ft with hemiwalker + w/c follow for safety  upgraded to min/mod assist overall  sitting/standing balance, trunk control, awareness, w/c mobility, transfers, gait, bed mobility, R NMR, caregiver training   Communication   Mod A  Max A  increased vocal intensity, verbal expression of wants/needs at the phrase level   Safety/Cognition/ Behavioral Observations  Mod A  Min A  problem solving, recall and attention   Pain   no c/o pain  remain pain free  assess pain QS and prn   Skin   skin intact  maintain skin integrity  assess skin    Rehab Goals Patient on target to meet rehab goals: Yes *See Care Plan and progress notes for long and short-term goals.     Barriers to Discharge  Current Status/Progress Possible Resolutions Date Resolved   Nursing                  PT                    OT                  SLP                SW                Discharge Planning/Teaching Needs:  Home with parents to provide 24/7 assistance.  education complete   Team Discussion: Scan showed decreased swelling, residual tumor, start rad therapy in  near future, making progress, insurance may not extend his stay.  No RN issues.  OT min A transfers.  PT mod A transfers, amb up to 60' hemi walker.  SLP regular, thins, mod A overall.  May need to practice car transfers, anxious to go home.  May start rad onc treatments next Monday?   Revisions to Treatment Plan: N/A     Medical Summary Current Status: F/U MRI with residual tumor, plan for XRT/CTX per rad-onc/onc, making neurological gains, spasiticy better Weekly Focus/Goal: finalize dc planning  Barriers to Discharge: Medical stability       Continued Need for Acute Rehabilitation Level of Care: The patient requires daily medical management by a physician with specialized training in physical medicine and rehabilitation for the following reasons: Direction of a multidisciplinary physical rehabilitation program to maximize  functional independence : Yes Medical management of patient stability for increased activity during participation in an intensive rehabilitation regime.: Yes Analysis of laboratory values and/or radiology reports with any subsequent need for medication adjustment and/or medical intervention. : Yes   I attest that I was present, lead the team conference, and concur with the assessment and plan of the team.   Jodell Cipro M 11/25/2018, 2:32 PM  Team conference was held via web/ teleconference due to [place] - 19

## 2018-11-25 NOTE — Progress Notes (Signed)
Speech Language Pathology Daily Session Note  Patient Details  Name: BECKER FERRING MRN: [id number] Date of Birth: August 17, 1995  Today's Date: 11/25/2018  Session 1: SLP Individual Time: 0830-0900 SLP Individual Time Calculation (min): 30 min   Session 2: SLP Individual Time: [id number] SLP Individual Time Calculation (min): 30 min  Short Term Goals: Week 5: SLP Short Term Goal 1 (Week 5): STGs=LTGs due to ELOS  Skilled Therapeutic Interventions:  Session 1: Skilled treatment session focused on speech goals. SLP facilitated session by providing Mod A phonemic and verbal cues for patient to read familiar words aloud and to name functional objects with 100% accuracy. Patient with decreased ability to self-monitor and correct phonemic errors this session. Patient also required Max A multimodal cues to convey mildly abstract wants/needs at the phrase level. Patient left upright in recliner with alarm on and all needs within reach. Continue with current plan of care.   Session 2: Skilled treatment session focused on speech goals. SLP facilitated session by providing Mod A verbal and phonemic cues for word-finding and reading aloud at the word level. Patient's mother present and educated on strategies to utilize to maximize verbal expression, word-finding and overall cognitive functioning. She verbalized understanding of all information. Patient left upright in wheelchair with alarm on and all needs within reach. Continue with current plan of care.   Pain No/Denies Pain  Therapy/Group: Individual Therapy  Jenia Klepper 11/25/2018, 10:19 AM

## 2018-11-25 NOTE — Progress Notes (Signed)
Occupational Therapy Session Note  Patient Details  Name: Gregory Moreno MRN: [id number] Date of Birth: October 12, 1995  Today's Date: 11/25/2018 OT Individual Time: 1400-1500 OT Individual Time Calculation (min): 60 min    Short Term Goals: Week 5:  OT Short Term Goal 1 (Week 5): Continue progressing toward established LTG      Skilled Therapeutic Interventions/Progress Updates:    Pt received in w/c with no initial c/o pain and mom present in room. Mom reporting she feels comfortable with leading showers and does not need more practice at this time. Pt agreeable to showering with OT. Completed stand pivot transfer from w/c <> BSC with min A. Pt completed UB bathing with (S) and washmit on R hand and self HHA to wash upper body. Pt requesting to remove wash mit to wash LB; completed LB with min A for sitting balance when leaning over to wash B LE. Pt also required min VC to correct posture after bending over. Pt completed BSC <> w/c stand pivot transfer with same A as mentioned above. Completed UB dressing with min A to use hemi technique, completed LB dressing with min A for sit <> stand using bed rail as support. Pt is mod A for donning socks. Completed oral care sitting at sink with (S) to put toothpaste onto brush. End of session pt left in w/c, all needs met and mom present in room.   Therapy Documentation Precautions:  Precautions Precautions: Fall Precaution Comments: L crani Restrictions Weight Bearing Restrictions: No      Therapy/Group: Individual Therapy  Ltanya Bayley 11/25/2018, 2:56 PM

## 2018-11-26 ENCOUNTER — Inpatient Hospital Stay (HOSPITAL_COMMUNITY): Payer: 59 | Admitting: Physical Therapy

## 2018-11-26 ENCOUNTER — Inpatient Hospital Stay (HOSPITAL_COMMUNITY): Payer: 59

## 2018-11-26 ENCOUNTER — Inpatient Hospital Stay (HOSPITAL_COMMUNITY): Payer: 59 | Admitting: Speech Pathology

## 2018-11-26 ENCOUNTER — Ambulatory Visit
Admit: 2018-11-26 | Discharge: 2018-11-26 | Disposition: A | Discharge status: Home / Self Care | Payer: 59 | Source: Ambulatory Visit | Attending: Radiation Oncology | Admitting: Radiation Oncology

## 2018-11-26 ENCOUNTER — Other Ambulatory Visit: Payer: Self-pay

## 2018-11-26 DIAGNOSIS — C712 Malignant neoplasm of temporal lobe: Secondary | ICD-10-CM | POA: Diagnosis not present

## 2018-11-26 MED ORDER — TEMOZOLOMIDE 100 MG PO CAPS: ORAL_CAPSULE | ORAL | 0 refills | Status: DC

## 2018-11-26 MED ORDER — TEMOZOLOMIDE 20 MG PO CAPS: ORAL_CAPSULE | ORAL | 0 refills | Status: DC

## 2018-11-26 MED ORDER — ONDANSETRON HCL 8 MG PO TABS: 8.0000 mg | ORAL_TABLET | Freq: Two times a day (BID) | ORAL | 1 refills | Status: DC | PRN

## 2018-11-26 NOTE — Discharge Summary (Signed)
Physician Discharge Summary  Patient ID: Gregory Moreno MRN: [id number] DOB/AGE: [birth year] 22 y.o.  Admit date: 10/21/2018 Discharge date: 11/28/2018  Discharge Diagnoses:  Principal Problem:   Intracranial tumor ([facility]) Active Problems:   ICH (intracerebral hemorrhage) ([facility])   Steroid-induced hyperglycemia   Spastic hemiplegia affecting nondominant side ([facility])   Hyponatremia   Transaminitis   Leucocytosis   Cerebral edema ([facility])   Spastic hemiparesis ([facility])   Palliative care by specialist DVT prophylaxis Pain management Dysphagia Constipation Steroid-induced hyperglycemia  Discharged Condition: Stable  Significant Diagnostic Studies: [name] Wo Contrast  Addendum Date: 11/25/2018   ADDENDUM REPORT: 11/25/2018 17:19 ADDENDUM: Additional area of residual tumor in the left inferior basal ganglia. This area shows restricted diffusion and hyperintense signal on T2 and FLAIR with mass-effect. This measures approximately 2 cm in diameter with mild enhancement. This mass appears to have enlarged since the prior MRI of 11/11/2018 compatible with actively growing tumor. The other area of residual tumor in the left medial temporal lobe measures 20 mm in diameter and does not show significant enhancement. This mass may have grown slightly since the prior MRI of 11/11/2018. This also shows restricted diffusion. Imaging findings were discussed by phone with Dr. Mickeal Moreno Electronically Signed   By: Gregory Moreno M.D.   On: 11/25/2018 17:19   Result Date: 11/25/2018 CLINICAL DATA:  Glioblastoma left temporal lobe.  Postop resection. EXAM: MRI HEAD WITHOUT AND WITH CONTRAST TECHNIQUE: Multiplanar, multiecho pulse sequences of the brain and surrounding structures were obtained without and with intravenous contrast. CONTRAST:  54mL GADAVIST GADOBUTROL 1 MMOL/ML IV SOLN COMPARISON:  MRI head 11/11/2018, 10/14/2018, 10/13/2018 FINDINGS: Brain: Postop resection of large mass in the left temporal lobe.  Contracting encephalomalacia in the surgical site. Hemosiderin lined surgical cavity. Masslike enlargement of the left medial temporal lobe measuring 18 mm in diameter. This does not enhance and most likely is residual tumor. Much of the original tumor did not enhance. 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 Improvement in cortical enhancement in the left inferior occipital lobe extending into the posterior temporal lobe on the left. Infarct also involves the left thalamus particularly the pulvinar. Small area of infarct in the splenium of the corpus callosum continues to show restricted diffusion. Ventricle size normal.  No midline shift. Vascular: Normal arterial flow voids Skull and upper cervical spine: Left temporal craniotomy. Sinuses/Orbits: Negative Other: None IMPRESSION: Further contraction of the surgical resection cavity in left temporal lobe. 18 mm masslike enlargement left medial temporal lobe compatible with residual nonenhancing tumor Resolving left posterior cerebral artery infarct. Electronically Signed: By: Gregory Moreno M.D. On: 11/24/2018 13:11   [name] [id number] Contrast  Result Date: 11/11/2018 CLINICAL DATA:  Ped, neoplasm; CNS primary, follow-up. 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 EXAM: MRI HEAD WITHOUT AND WITH CONTRAST TECHNIQUE: Multiplanar, multiecho pulse sequences of the brain and surrounding structures were obtained without and with intravenous contrast. CONTRAST:  77mL GADAVIST GADOBUTROL 1 MMOL/ML IV SOLN COMPARISON:  Brain MRI examinations 10/14/2018 and earlier, head CT examinations 10/14/2018 and earlier FINDINGS: Brain: There has been continued interval evolution of postoperative changes related to prior left temporal hematoma decompression and tumor resection. Persistent nonacute blood products within and along the resection/hematoma cavity. Parenchymal edema  surrounding the resection cavity has significantly decreased. There are persistent areas of diffusion weighted signal abnormality along portions of the resection cavity which may reflect evolving postoperative/post-ischemic change, although attention is  recommended on follow-up imaging to exclude nonenhancing infiltrative tumor. These areas are present along the anteromedial aspect of the resection cavity within the inferior left basal ganglia, anteroinferior left frontal lobe and left subinsular region. These areas are also within the medial left thalamus and within the left temporal stem. There is a persistent small focus of restricted diffusion within the splenium of the corpus callosum which is favored post ischemic. There is nonspecific curvilinear and slightly irregular enhancement surrounding the hematoma/resection cavity. There is parenchymal edema and gyriform enhancement within portions of the left temporal and occipital lobes. This largely corresponds with regions of previously demonstrated ischemia. Some of the areas of edema and gyriform enhancement within the posterior left temporal lobe and occipital lobe do not correspond with sites of previously demonstrated ischemia, although are suspicious for interval ischemia (now nonacute). 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 within the paramedian left parietal lobe and within the left fornix. Significant interval decrease in mass effect. No midline shift persists. An extra-axial collection deep to the cranioplasty has not significantly changed, again measuring 8 mm in width. Persistent small fluid collection overlying the cranioplasty. Vascular: Flow voids maintained within the proximal large arterial vessels. Skull and upper cervical spine: No focal marrow lesion. Left-sided cranioplasty Sinuses/Orbits: Visualized orbits demonstrate no acute abnormality. Trace ethmoid sinus mucosal thickening. No significant mastoid effusion  IMPRESSION: 1. Interval evolution of postoperative changes related to previous left temporal hematoma decompression and tumor resection as detailed. 2. Regions of persistent diffusion-weighted signal abnormality along portions of the resection cavity which may reflect evolving postoperative/post-ischemic change, although close imaging follow-up is recommended to exclude nonenhancing infiltrative tumor. 3. Nonspecific curvilinear and slightly irregular enhancement surrounding the resection/hematoma cavity. Attention recommended on follow-up. 4. Redemonstrated multifocal post ischemic changes. Areas of edema and gyriform enhancement within portions of the posterior left temporal lobe and occipital lobe do not correspond with sites of previously demonstrated ischemia, although are suspicious for interval ischemia (now non-acute). 5. Significant interval decrease in mass effect. No midline shift persists. 6. Unchanged extra-axial collection deep to the cranioplasty measuring 8 mm in thickness. Electronically Signed   By: Kellie Simmering DO   On: 11/11/2018 15:38    Labs:  Basic Metabolic Panel: Recent Labs  Lab 11/27/18 0936  NA 133*  K 4.1  CL 100  CO2 21*  GLUCOSE 99  BUN 22*  CREATININE 0.79  CALCIUM 9.2    CBC: Recent Labs  Lab 11/27/18 0936  WBC 23.0*  HGB 13.7  HCT 42.8  MCV 88.6  PLT 173    CBG: No results for input(s): GLUCAP in the last 168 hours.  Family history.  Unremarkable  Brief HPI:   Gregory Moreno is a 23 y.o. right-handed male with unremarkable past medical history.  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  Parents with good support.  Presented 10/13/2018 with intermittent headaches and right-sided weakness.  No reports of any recent trauma.  CT and MRI and imaging showed left temporal lobe hematoma with surrounding infiltrative masslike appearance favoring glioma.  CT angiogram of head and neck with no aneurysm noted.   Underwent left craniotomy for hematoma evacuation and tumor resection 10/13/2018 per Dr. Venetia Constable.  Per mother no speech since surgery he was mumbling a few words.  Maintained on Decadron protocol.  Patient did have some leukocytosis 21,000 felt to be induced by steroids.  Pathology report was pending.  Follow-up MRI showed small acute infarct right  globus pallidus and right midbrain.  Dysphagia #2 thin liquid diet.  Subcutaneous heparin for DVT prophylaxis initiated 10/16/2018.  Patient was admitted for a comprehensive rehab program   Hospital Course: Gregory Moreno was admitted to rehab 10/21/2018 for inpatient therapies to consist of PT, ST and OT at least three hours five days a week. Past admission physiatrist, therapy team and rehab RN have worked together to provide customized collaborative inpatient rehab.  Pertaining to patient intracerebral hemorrhage pathology report GBM and patient followed by Dr. Venetia Constable of neurosurgery.  He had undergone hematoma evacuation and tumor resection 10/13/2018.  Follow-up MRI improved postoperative changes he remained on Decadron protocol plan was for XRT and CTX per radiation oncology Dr. Mickeal Moreno.  Was maintained on subcutaneous heparin for DVT prophylaxis.  No bleeding episodes.  Pain managed with use of Tylenol.  He was on a dysphagia #2 thin liquid diet and diet advanced to regular.  Patient noted aphasia follow-up speech therapy he was placed on Ritalin as well as amantadine/lexapro for initiation and poor attention with mood stabilization   Bouts of constipation resolved with laxative assistance.  Noted spasticity baclofen titrated accordingly.  Bouts of hyperglycemia felt to be steroid related.   Blood pressures were monitored on TID basis and stable   He/ has made gains during rehab stay and is attending therapies  He/ will continue to receive follow up therapies   after discharge  Rehab course: During patient's stay in rehab weekly team conferences  were held to monitor patient's progress, set goals and discuss barriers to discharge. At admission, patient required max assist sit to stand, +2 physical assist supine to sit, max assist grooming, max assist lower body bathing, max assist upper body dressing total assist lower body dressing  Physical exam.  Blood pressure 106/74 pulse 54 temperature 98.7 respirations 15 oxygen saturations 99% room air Constitutional patient awake tends to stare with left eye closed nonverbal limited tracking. HEENT.  Craniotomy site clean and dry no significant erythema Eyes pupils reactive to light no nystagmus Neck.  Supple nontender no JVD without deviation Cardiac regular rate rhythm no murmur or extra sounds Respiratory clear to auscultation without wheeze GI.  Soft bowel sounds normal without rebound Musculoskeletal.  Some spontaneous movement left upper extremity Neurological follows some basic commands he would point to certain numbers on his communication board exam overall was limited  He/  has had improvement in activity tolerance, balance, postural control as well as ability to compensate for deficits. He/ has had improvement in functional use RUE/LUE  and RLE/LLE as well as improvement in awareness       Disposition: Discharge disposition: 01-Home or Self Care     Discharge to home   Diet: Regular  Special Instructions: Patient would follow-up with Dr. Venetia Constable of neurosurgery with Dr. Mickeal Moreno for ongoing radiation therapy and CTX  Medications at discharge. 1.  Tylenol as needed 2.  Amantadine 100 mg p.o. twice daily 3.  Baclofen 5 mg p.o. 3 times daily 4.  Decadron 4 mg every 12 hours taper as directed 5.  Lexapro 5 mg p.o. nightly 6.  Ritalin 10 mg p.o. twice daily 7.  Protonix 40 mg p.o. daily 8.  Senokot S1 tablet p.o. twice daily 9.Temodar as directed by Dr Sherrilee Gilles  Discharge Instructions    Ambulatory referral to Physical Medicine Rehab   Complete by: As directed     Moderate complexity follow-up 1 to 2 weeks ICH with GBM      Follow-up  Information    Meredith Staggers, MD Follow up.   Specialty: Physical Medicine and Rehabilitation Why: Office to call for appointment Contact information: [address] [place] [address] [phone]        Judith Part, MD Follow up.   Specialty: Neurosurgery Why: Call for appointment Contact information: [place] [address] [phone]        Ventura Sellers, MD Follow up.   Specialties: Psychiatry, Neurology, Oncology Why: call for appointments Contact information: [place] [address] [phone]        Hayden Pedro, PA-C Follow up.   Specialty: Radiation Oncology Why: call for appointment Contact information: [place] [address] [phone]           Signed: Cathlyn Parsons 11/28/2018, 5:24 AM

## 2018-11-26 NOTE — Telephone Encounter (Signed)
Oral Oncology Pharmacist Encounter  Insurance authorization for temozolomide capsules has been approved. Effective dates: 11/25/18-11/25/2019  Prescriptions must be filled at [facility] per insurance requirement.  Johny Drilling, PharmD, BCPS, BCOP  11/26/2018 6:18 AM Oral Oncology Clinic [phone]

## 2018-11-26 NOTE — Progress Notes (Signed)
Physical Therapy Discharge Summary  Patient Details  Name: Gregory Moreno MRN: [id number] Date of Birth: 06-16-1995  Today's Date: 11/27/2018 PT Individual Time: 0915-1015 PT Individual Time Calculation (min): 60 min   Pt in supine and agreeable to therapy, denies pain. Performed functional mobility as detailed below including bed mobility, transfers, and w/c mobility. Mom present for session and feels comfortable assisting pt w/ all mobility, denies need for further education this session. Worked on trunk control w/ weight shifting at edge of mat. Seated<>R/L sidelying and straight back to supine w/ supervision and verbal/visual cues for technique and tactile cues for R oblique/abdominal musculature activation. Worked on posture and neutral weight shifting in stance w/ mirror for visual feedback, no UE support. Needs mod assist overall for balance w/ therapist providing tactile and verbal cues at R quad and glut to reach neutral RLE alignment. Verbal cues for weight shifting at trunk and shoulders as well. Performed 10+ stands in total. Ambulated 67' x2 w/ mod assist w/ hemiwalker, manual and verbal cues for L lateral weight shifting and for RLE step placement. Returned to room total assist in w/c, ended session in w/c and in care of mom, all needs met.   Patient has met 7 of 7 long term goals due to improved activity tolerance, improved balance, improved postural control, increased strength, ability to compensate for deficits, functional use of  right upper extremity and right lower extremity, improved attention, improved awareness and improved coordination.  Patient to discharge at a wheelchair level [place] for transfers, mod assist for gait (w/ therapy staff only) up to 50'.   Patient's care partneris independent to provide the necessary physical and cognitive assistance at discharge. Both parents have had extensive hands-on education w/ bed mobility and both stand/squat pivot and slide board  transfers in both directions. Both provide good cues to pt and safe management of R hemiplegic side. Both in agreement that pt is w/c level only at this time 2/2 skilled assistance required for safe gait.   Reasons goals not met: n/a  Recommendation:  Patient will benefit from ongoing skilled PT services in home health setting to continue to advance safe functional mobility, address ongoing impairments in functional balance, postural control, RLE NMR and muscle activation, trunk mobility, and global endurance, and minimize fall risk.  Equipment: w/c, slide board, hemiwalker  Reasons for discharge: treatment goals met and discharge from hospital  Patient/family agrees with progress made and goals achieved: Yes  PT Discharge Precautions/Restrictions Precautions Precautions: Fall Restrictions Weight Bearing Restrictions: No Vision/Perception  Vision - Assessment Eye Alignment: Within Functional Limits Ocular Range of Motion: Within Functional Limits Alignment/Gaze Preference: Within Defined Limits Tracking/Visual Pursuits: Requires cues, head turns, or add eye shifts to track(suspect 2/2 apraxia) Perception Perception: Impaired Inattention/Neglect: Does not attend to right visual field;Does not attend to right side of body(much improved since eval) Praxis Praxis: Impaired Praxis Impairment Details: Motor planning  Cognition Overall Cognitive Status: Impaired/Different from baseline Arousal/Alertness: Awake/alert Orientation Level: Oriented X4 Attention: Sustained;Selective Focused Attention: Appears intact Sustained Attention: Appears intact Selective Attention: Impaired Selective Attention Impairment: Verbal basic;Functional basic Memory: Impaired Memory Impairment: Decreased short term memory Decreased Short Term Memory: Functional basic;Verbal basic Awareness: Impaired Awareness Impairment: Emergent impairment Problem Solving: Impaired Problem Solving Impairment: Verbal  basic;Functional basic Safety/Judgment: Impaired Sensation Sensation Light Touch: Appears Intact Proprioception: Appears Intact Coordination Gross Motor Movements are Fluid and Coordinated: No Heel Shin Test: RLE impaired 2/2 weakness Motor  Motor Motor: Hemiplegia;Abnormal tone Motor - Discharge  Observations: R hemi remains, UE>LE, increased hamstring and adductor tone on R side, especially w/ exertion  Mobility Bed Mobility Bed Mobility: Rolling Right;Rolling Left;Sit to Supine;Supine to Sit Rolling Right: Supervision/verbal cueing Rolling Left: Minimal Assistance - Patient > 75% Supine to Sit: Minimal Assistance - Patient > 75% Sit to Supine: Minimal Assistance - Patient > 75% Transfers Transfers: Stand to Sit;Sit to Stand;Stand Pivot Transfers Sit to Stand: Minimal Assistance - Patient > 75% Stand to Sit: Minimal Assistance - Patient > 75% Stand Pivot Transfers: Minimal Assistance - Patient > 75% Stand Pivot Transfer Details: Manual facilitation for placement;Manual facilitation for weight bearing;Manual facilitation for weight shifting;Tactile cues for placement;Verbal cues for technique;Verbal cues for precautions/safety Transfer (Assistive device): None Locomotion  Gait Ambulation: Yes Gait Assistance: Moderate Assistance - Patient 50-74% Gait Distance (Feet): 50 Feet Assistive device: Hemi-walker Gait Assistance Details: Manual facilitation for placement;Manual facilitation for weight shifting;Verbal cues for safe use of DME/AE;Verbal cues for precautions/safety;Tactile cues for posture Gait Assistance Details: needs most assistance w/ L lateral weight shifting during R swing, occasional assistance needed for increasing RLE step length 2/2 decreased hip flexor strength Gait Gait: Yes Gait Pattern: Impaired Gait Pattern: Decreased dorsiflexion - right;Decreased weight shift to left;Decreased step length - right;Narrow base of support;Poor foot clearance - right Gait  velocity: decreased Stairs / Additional Locomotion Stairs: No Wheelchair Mobility Wheelchair Mobility: Yes Wheelchair Assistance: Supervision/Verbal cueing Wheelchair Propulsion: Left upper extremity;Left lower extremity Wheelchair Parts Management: Needs assistance Distance: 51'  Trunk/Postural Assessment  Cervical Assessment Cervical Assessment: Exceptions to WFL(global stiffness, improved since evaluation) Thoracic Assessment Thoracic Assessment: Within Functional Limits Lumbar Assessment Lumbar Assessment: Exceptions to WFL(tends to keep weight shifted onto L pelvis, needs cues for equal hip weight distribution, able to correct w/o physical assist) Postural Control Postural Control: Deficits on evaluation(overall has greatly improved since evaluation) Head Control: WNL Trunk Control: delayed/insufficient, especially w/ R lateral leans Righting Reactions: delayed/insufficient Protective Responses: delayed/insufficient  Balance Balance Balance Assessed: Yes Static Sitting Balance Static Sitting - Balance Support: Feet supported;No upper extremity supported Static Sitting - Level of Assistance: 5: Stand by assistance Dynamic Sitting Balance Dynamic Sitting - Level of Assistance: 4: Min assist Static Standing Balance Static Standing - Balance Support: During functional activity;Left upper extremity supported Static Standing - Level of Assistance: 4: Min assist Dynamic Standing Balance Dynamic Standing - Level of Assistance: 3: Mod assist Extremity Assessment  RLE Assessment Passive Range of Motion (PROM) Comments: [facility] Strength Comments: Hip and knee 1 to 2/5, ankle 0/5 LLE Assessment LLE Assessment: Within Functional Limits    Renly Roots K Audryna Wendt 11/27/2018, 7:53 AM

## 2018-11-26 NOTE — Telephone Encounter (Signed)
Oral Chemotherapy Pharmacist Encounter   Attempted to reach patient's mother to provide update and offer for initial counseling on oral medication: Temodar.  No answer.  Left voicemail to call back to discuss details of medication acquisition and initial counseling session.  [facility] provided on voicemail. Phone number to dispensing pharmacy also included [phone]). [facility] [place], this information left on voicemail as well. I am not in the office today, so will follow-up for initial counseling tomorrow since I was not able to connect with patient's family today. I did leave the direct dial to my desk in voicemail with information that I would not return call until tomorrow.  Johny Drilling, PharmD, BCPS, BCOP  11/26/2018   11:06 AM Oral Oncology Clinic [phone]

## 2018-11-26 NOTE — Progress Notes (Signed)
I spoke with the patient and his parents today to review the plans for radiotherapy and we reviewed and signed consent to proceed. A copy of the consent was given to the patient's parents.     Carola Rhine, PAC

## 2018-11-26 NOTE — Progress Notes (Signed)
Patient brought by ambulance service to Radiation Oncology to have CT sim for whole brain radiation. He is resting quietly n bed watching TV denies any issues or complaints of. We are waiting for his parents to arrive for consent. Seen by Shona Simpson PA C

## 2018-11-26 NOTE — Progress Notes (Signed)
[name] PHYSICAL MEDICINE & REHABILITATION PROGRESS NOTE  Subjective/Complaints:  Sitting up in chair eating breakfast with therapist at bedside. No new complaints. Denies pain or more muscle cramps at night. Denies constipation.  VS stable this morning.   ROS: Limited due to cognitive/behavioral    Objective: Vital Signs: Blood pressure 128/71, pulse 62, temperature 98.1 F (36.7 C), temperature source Oral, resp. rate 15, height 5\' 7"  (1.702 m), weight 72 kg, SpO2 99 %. [name] Wo Contrast  Addendum Date: 11/25/2018   ADDENDUM REPORT: 11/25/2018 17:19 ADDENDUM: Additional area of residual tumor in the left inferior basal ganglia. This area shows restricted diffusion and hyperintense signal on T2 and FLAIR with mass-effect. This measures approximately 2 cm in diameter with mild enhancement. This mass appears to have enlarged since the prior MRI of 11/11/2018 compatible with actively growing tumor. The other area of residual tumor in the left medial temporal lobe measures 20 mm in diameter and does not show significant enhancement. This mass may have grown slightly since the prior MRI of 11/11/2018. This also shows restricted diffusion. Imaging findings were discussed by phone with Dr. [name] Electronically Signed   By: Franchot Gallo M.D.   On: 11/25/2018 17:19   Result Date: 11/25/2018 CLINICAL DATA:  Glioblastoma left temporal lobe.  Postop resection. EXAM: MRI HEAD WITHOUT AND WITH CONTRAST TECHNIQUE: Multiplanar, multiecho pulse sequences of the brain and surrounding structures were obtained without and with intravenous contrast. CONTRAST:  20mL GADAVIST GADOBUTROL 1 MMOL/ML IV SOLN COMPARISON:  MRI head 11/11/2018, 10/14/2018, 10/13/2018 FINDINGS: Brain: Postop resection of large mass in the left temporal lobe. Contracting encephalomalacia in the surgical site. Hemosiderin lined surgical cavity. Masslike enlargement of the left medial temporal lobe measuring 18 mm in diameter. This  does not enhance and most likely is residual tumor. Much of the original tumor did not enhance. 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 Improvement in cortical enhancement in the left inferior occipital lobe extending into the posterior temporal lobe on the left. Infarct also involves the left thalamus particularly the pulvinar. Small area of infarct in the splenium of the corpus callosum continues to show restricted diffusion. Ventricle size normal.  No midline shift. Vascular: Normal arterial flow voids Skull and upper cervical spine: Left temporal craniotomy. Sinuses/Orbits: Negative Other: None IMPRESSION: Further contraction of the surgical resection cavity in left temporal lobe. 18 mm masslike enlargement left medial temporal lobe compatible with residual nonenhancing tumor Resolving left posterior cerebral artery infarct. Electronically Signed: By: Franchot Gallo M.D. On: 11/24/2018 13:11   No results for input(s): WBC, HGB, HCT, PLT in the last 72 hours. No results for input(s): NA, K, CL, CO2, GLUCOSE, BUN, CREATININE, CALCIUM in the last 72 hours.  Physical Exam: BP 128/71 (BP Location: Left Arm)   Pulse 62   Temp 98.1 F (36.7 C) (Oral)   Resp 15   Ht 5\' 7"  (123XX123 m)   Wt 72 kg   SpO2 99%   BMI 24.86 kg/m  Constitutional: No distress . Vital signs reviewed. Sitting up in bed eating breakfast.  HEENT: EOMI, oral membranes moist Neck: supple Cardiovascular: RRR without murmur. No JVD    Respiratory: CTA Bilaterally without wheezes or rales. Normal effort    GI: BS +, non-tender, non-distended  Skin: Warm and dry.  Intact. Psych: alert and smiling/good spirits.  Musc: No edema in extremities.  No tenderness in extremities. Neuro: alert, improving language, talking in phrases and sentences, word subsituttion, seems  to be comprehending a lot more.  Motor:  RUE 1-2/5 prox to tr 1/5 distally and RLE tr-1/5.   Tone tr/4 R.  DTR's  2++  Assessment/Plan: 1. 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  Physiatrist is providing close team supervision and 24 hour management of active medical problems listed below.  Physiatrist and rehab team continue to assess barriers to discharge/monitor patient progress toward functional and medical goals  Care Tool:  Bathing    Body parts bathed by patient: Right arm, Left arm, Chest, Abdomen, Front perineal area, Buttocks, Left lower leg, Right lower leg, Left upper leg, Right upper leg   Body parts bathed by helper: Left arm     Bathing assist Assist Level: Minimal Assistance - Patient > 75%     Upper Body Dressing/Undressing Upper body dressing   What is the patient wearing?: Pull over shirt    Upper body assist Assist Level: Supervision/Verbal cueing    Lower Body Dressing/Undressing Lower body dressing      What is the patient wearing?: Pants     Lower body assist Assist for lower body dressing: Minimal Assistance - Patient > 75%     Toileting Toileting    Toileting assist Assist for toileting: Contact Guard/Touching assist     Transfers Chair/bed transfer  Transfers assist  Chair/bed transfer activity did not occur: Safety/medical concerns  Chair/bed transfer assist level: Contact Guard/Touching assist     Locomotion Ambulation   Ambulation assist   Ambulation activity did not occur: Safety/medical concerns  Assist level: Moderate Assistance - Patient 50 - 74% Assistive device: Walker-hemi Max distance: 50'   Walk 10 feet activity   Assist  Walk 10 feet activity did not occur: Safety/medical concerns  Assist level: Moderate Assistance - Patient - 50 - 74% Assistive device: Walker-hemi   Walk 50 feet activity   Assist Walk 50 feet with 2 turns activity did not occur: Safety/medical  concerns  Assist level: Moderate Assistance - Patient - 50 - 74% Assistive device: Walker-hemi    Walk 150 feet activity   Assist Walk 150 feet activity did not occur: Safety/medical concerns         Walk 10 feet on uneven surface  activity   Assist Walk 10 feet on uneven surfaces activity did not occur: Safety/medical concerns         Wheelchair     Assist Will patient use wheelchair at discharge?: Yes Type of Wheelchair: Manual Wheelchair activity did not occur: Safety/medical concerns  Wheelchair assist level: Minimal Assistance - Patient > 75%, Supervision/Verbal cueing Max wheelchair distance: 100 ft    Wheelchair 50 feet with 2 turns activity    Assist    Wheelchair 50 feet with 2 turns activity did not occur: Safety/medical concerns   Assist Level: Minimal Assistance - Patient > 75%, Supervision/Verbal cueing   Wheelchair 150 feet activity     Assist Wheelchair 150 feet activity did not occur: Safety/medical concerns   Assist Level: Minimal Assistance - Patient > 75%      Medical Problem List and Plan: 1.Right greater than left-sided weaknesssecondary to intracerebral hemorrhage with GBM.Surgery was done emergently due to signs of herniation due to notes.Status post left crani for hematoma evacuation and tumor resection 10/13/2018.   Continue CIR therapies, PT OT speech  -spoke to [facility] medical director who denied further days on rehab---working on an appeal  to allow for family ed. Original date 11/13  PRAFO, WHO at night  -Yesterday's MRI with improved post-operative changes, 38mm residual tumor  -Plan is for  XRT and CTX per Rad-Onc, Dr. [name]   2. Antithrombotics: -DVT/anticoagulation:Subcutaneous heparin initiated 10/16/2018 -antiplatelet therapy: N/A 3. Pain Management:Tylenol as needed 4. Mood:Provide emotional support -antipsychotic agents: N/A 5. Neuropsych: This patientis not of making decisions  on hisown behalf. 6. Skin/Wound Care:Routine skin checks 7. Fluids/Electrolytes/Nutrition:intake 100% 8. Dysphagia. Dysphagia #2 thin liquids. Follow-up speech therapy  Advance diet as tolerated 9. 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 Constipation  Bowel meds as necessary 11. Spasticity  Baclofen 5mg  2 times daily started on 10/9---increased to TID 10/13   -prn dose added for breakthrough spasms 11/4   -improved control   -continue resting orthotics 12. Hiccups  Baclofen as needed  Improved 13.  Steroid-induced hyperglycemia  Continue to monitor with steroid wean 14.  Hyponatremia  Sodium 135 11/2 15.  Transaminitis  LFTs   stable-recheck later this week 16.  Leukocytosis-  Steroid induced.  -10/26- WBC 8k--> 13.9 11/2 as expected      LOS: 36 days A FACE TO [place] 11/26/2018, 8:46 AM

## 2018-11-26 NOTE — Telephone Encounter (Signed)
Oral Oncology Pharmacist Encounter  Insurance authorization for temozolomide has been approved. Pres[facility] per insurance requirement. Temozolomide 100mg  capsules prescription and 200mg  capsule prescriptions [facility] [place], [place].  [facility]ndans[facility]ron [facility] [place] noted in patient's chart.  I will reach out to patient and family to provide details of medication acquisition and initial counseling once I can confirm [facility] is processing patient's temozolomide prescriptions.  Johny Drilling, PharmD, BCPS, BCOP  11/26/2018 6:14 AM Oral Oncology Clinic [phone]

## 2018-11-26 NOTE — Progress Notes (Signed)
Physical Therapy Session Note  Patient Details  Name: Gregory Moreno MRN: [id number] Date of Birth: 1995/09/18  Today's Date: 11/26/2018 PT Individual Time: 1300-1345 PT Individual Time Calculation (min): 45 min   Short Term Goals: Week 5:  PT Short Term Goal 1 (Week 5): STG = LTG due to estimated d/c date.  Skilled Therapeutic Interventions/Progress Updates:   Pt in recliner and agreeable to therapy, no c/o pain. Min assist stand pivot to w/c. Total assist w/c transport to/from therapy gym. Worked on gait training and pre-gait NMR this session. Ambulated 74' w/ hemiwalker and min-mod assist for L lateral weight shifting during R swing and occasional total assist for RLE placement 2/2 hip flexor weakness. Ambulated 19' w/ RW and RUE orthosis, same cues as listed above and 2nd helper assisting w/ managing RW. Worked on L lateral weight shifting in stance remainder of session w/ RW support and mirror for visual feedback. Tactile and verbal cues for RLE neutral alignment including quad activation, hip extensor activation, and neutral pelvis. Pt needed mod assist at first, fading to CGA w/ tactile cues after multiple bouts of standing and having pt correct his posture in mirror. Added in R forward and backward stepping to mimic gait, min assist fading to CGA w/ L lateral weight shifting during R swing. Returned to room and ended session in recliner, all needs in reach.   Therapy Documentation Precautions:  Precautions Precautions: Fall Precaution Comments: L crani Restrictions Weight Bearing Restrictions: No  Therapy/Group: Individual Therapy  Damesha Lawler K Tamra Koos 11/26/2018, 1:55 PM

## 2018-11-26 NOTE — Progress Notes (Signed)
Social Work Patient ID: Gregory Moreno, male   DOB: [birth year], 23 y.o.   MRN: [id number]  Met with pt and mother yesterday to discuss insurance issues and, later, for team conference.  Pt and parents are aware that we were issued a denial from insurance for any further CIR coverage beginning yesterday.  Pt/parents requested that I assist with with submitted a formal appeal on their behalf.  I submitted the appeal and needed documentation yesterday and will keep all updated on response.  They are aware that insurance may take up to 72 hours to respond.  Pt and parents very pleased with progress AJ continues to make.  Reviewed DME but still need to discuss follow up further.  He will go for simulation today and we may know more about the ongoing tx schedule after that?  Continue to follow.  Guadalupe Nickless, LCSW

## 2018-11-26 NOTE — Progress Notes (Signed)
Occupational Therapy Session Note  Patient Details  Name: Gregory Moreno MRN: [id number] Date of Birth: 12-12-1995  Today's Date: 11/26/2018 OT Individual Time: [id number] OT Individual Time Calculation (min): 65 min    Short Term Goals: Week 5:  OT Short Term Goal 1 (Week 5): Continue progressing toward established LTG     Skilled Therapeutic Interventions/Progress Updates:    Pt received in w/c, no c/o pain and agreeable to tx with OT. Pt donned socks and shoes with mod A d/t time and session focusing on activity in therapy gym. Pt completed oral care seated at sink with (S). Pt transported to therapy gym tx focused on R UE and standing balance. Pt completed B UE exercises on mat in standing. Completed shoulder flexion/extension ROM exercises with exercise ball. Pt required min VC to maintain B shoulder Adduction on ball throughout exercise. Pt completed 2 trials, with 10 reps each. Required rest break between trials. Pt required mod A +2 for standing balance during exercise. Pt completed dead lifts with 1# dowlrod with +2 helpers for standing balance. Pt completed 2 trials; 10 reps each to address standing balance, posterior chain strength, and B UE use for carryover to functional ADL task. Pt completed functional mobility with hemi-walker and +2 helpers for balance and multimodal cueing of R leg to address functional mobility and activity endurance. End of session, EMT had arrived to transport pt to appointment. Dad present with pt at end of session.   Therapy Documentation Precautions:  Precautions Precautions: Fall Precaution Comments: L crani Restrictions Weight Bearing Restrictions: No      Therapy/Group: Individual Therapy  Maurisa Tesmer 11/26/2018, 1:19 PM

## 2018-11-26 NOTE — Progress Notes (Signed)
Speech Language Pathology Daily Session Note  Patient Details  Name: Gregory Moreno MRN: [id number] Date of Birth: 1995-08-31  Today's Date: 11/26/2018  Session 1: SLP Individual Time: [id number] SLP Individual Time Calculation (min): 30 min   Session 2: SLP Individual Time: 1430-1500 SLP Individual Time Calculation (min): 30 min  Short Term Goals: Week 5: SLP Short Term Goal 1 (Week 5): STGs=LTGs due to ELOS  Skilled Therapeutic Interventions:  Session 1: Skilled treatment session focused on speech goals. SLP facilitated session by providing extra time and Min A verbal cues for patient to self-monitor and correct phonemic errors at the phrase level during an informal conversation. Suspect function also impacted by patient self-feeding his breakfast meal during conversation. During a structured naming and reading task at the word level, patient achieved 100% accuracy with Mod phonemic and semantic cues. Patient left upright in wheelchair with all needs within reach. Continue with current plan of care.    Session 2: Skilled treatment session focused on speech goals and continued education with the patient's father. SLP facilitated session by providing Min verbal cues for patient to name objects with 100% accuracy and for decoding at the word level. However, increased cueing was needed when patient attempting to alternate between naming functional items and reading at the word level. Patient's father present and educated in regards to strategies to maximize verbal expression and appropriate cueing for word-finding. He verbalized understanding and agreement.  Patient left upright in recliner with alarm on and all needs within reach. Continue with current plan of care.   Pain No/Denies Pain   Therapy/Group: Individual Therapy  [name] 11/26/2018, 10:16 AM

## 2018-11-27 ENCOUNTER — Inpatient Hospital Stay (HOSPITAL_COMMUNITY): Payer: 59 | Admitting: Speech Pathology

## 2018-11-27 ENCOUNTER — Inpatient Hospital Stay (HOSPITAL_COMMUNITY): Payer: 59 | Admitting: Physical Therapy

## 2018-11-27 ENCOUNTER — Inpatient Hospital Stay (HOSPITAL_COMMUNITY): Payer: 59

## 2018-11-27 ENCOUNTER — Telehealth: Payer: Self-pay | Admitting: Internal Medicine

## 2018-11-27 LAB — COMPREHENSIVE METABOLIC PANEL
ALT: 84 U/L — ABNORMAL HIGH (ref 0–44)
AST: 36 U/L (ref 15–41)
Albumin: 3.7 g/dL (ref 3.5–5.0)
Alkaline Phosphatase: 60 U/L (ref 38–126)
Anion gap: 12 (ref 5–15)
BUN: 22 mg/dL — ABNORMAL HIGH (ref 6–20)
CO2: 21 mmol/L — ABNORMAL LOW (ref 22–32)
Calcium: 9.2 mg/dL (ref 8.9–10.3)
Chloride: 100 mmol/L (ref 98–111)
Creatinine, Ser: 0.79 mg/dL (ref 0.61–1.24)
GFR calc Af Amer: >60 mL/min (ref >60)
GFR calc non Af Amer: >60 mL/min (ref >60)
Glucose, Bld: 99 mg/dL (ref 70–99)
Potassium: 4.1 mmol/L (ref 3.5–5.1)
Sodium: 133 mmol/L — ABNORMAL LOW (ref 135–145)
Total Bilirubin: 0.8 mg/dL (ref 0.3–1.2)
Total Protein: 6.9 g/dL (ref 6.5–8.1)

## 2018-11-27 LAB — CBC
HCT: 42.8 % (ref 39.0–52.0)
Hemoglobin: 13.7 g/dL (ref 13.0–17.0)
MCH: 28.4 pg (ref 26.0–34.0)
MCHC: 32 g/dL (ref 30.0–36.0)
MCV: 88.6 fL (ref 80.0–100.0)
Platelets: 173 10*3/uL (ref 150–400)
RBC: 4.83 MIL/uL (ref 4.22–5.81)
RDW: 14.5 % (ref 11.5–15.5)
WBC: 23 10*3/uL — ABNORMAL HIGH (ref 4.0–10.5)
nRBC: 0.2 % (ref 0.0–0.2)

## 2018-11-27 MED ORDER — PANTOPRAZOLE SODIUM 40 MG PO TBEC: 40.0000 mg | DELAYED_RELEASE_TABLET | Freq: Every day | ORAL | 0 refills | Status: DC

## 2018-11-27 MED ORDER — ONDANSETRON HCL 4 MG PO TABS: 4.0000 mg | ORAL_TABLET | ORAL | 0 refills | Status: DC | PRN

## 2018-11-27 MED ORDER — SENNOSIDES-DOCUSATE SODIUM 8.6-50 MG PO TABS: 1.0000 | ORAL_TABLET | Freq: Two times a day (BID) | ORAL | Status: DC

## 2018-11-27 MED ORDER — ACETAMINOPHEN 325 MG PO TABS: 650.0000 mg | ORAL_TABLET | ORAL | Status: DC | PRN

## 2018-11-27 MED ORDER — DEXAMETHASONE 4 MG PO TABS: 4.0000 mg | ORAL_TABLET | Freq: Two times a day (BID) | ORAL | 0 refills | Status: DC

## 2018-11-27 MED ORDER — BACLOFEN 5 MG PO TABS: 5.0000 mg | ORAL_TABLET | Freq: Three times a day (TID) | ORAL | 1 refills | Status: DC

## 2018-11-27 MED ORDER — ESCITALOPRAM OXALATE 5 MG PO TABS: 5.0000 mg | ORAL_TABLET | Freq: Every day | ORAL | 0 refills | Status: DC

## 2018-11-27 MED ORDER — METHYLPHENIDATE HCL 10 MG PO TABS: 10.0000 mg | ORAL_TABLET | Freq: Two times a day (BID) | ORAL | 0 refills | Status: DC

## 2018-11-27 MED ORDER — AMANTADINE HCL 100 MG PO CAPS: 100.0000 mg | ORAL_CAPSULE | Freq: Two times a day (BID) | ORAL | 1 refills | Status: DC

## 2018-11-27 NOTE — Progress Notes (Signed)
Occupational Therapy Discharge Summary  Patient Details  Name: Gregory Moreno MRN: [id number] Date of Birth: 05-20-1995  Today's Date: 11/27/2018   Patient has met 12 of 12 long term goals due to improved activity tolerance, improved balance, postural control, ability to compensate for deficits, functional use of  RIGHT upper and RIGHT lower extremity, improved attention, improved awareness and improved coordination.  Patient to discharge at [facility] Assist level. Pt has made excellent progress in rehab. Patient's care partner is independent to provide the necessary physical and cognitive assistance at discharge. Family education and hands on training has been completed.     Reasons goals not met: N/A  Recommendation:  Patient will benefit from ongoing skilled OT services in home health setting to continue to advance functional skills in the area of BADL and iADL.  Equipment: No equipment provided  Reasons for discharge: treatment goals met  Patient/family agrees with progress made and goals achieved: Yes    OT Discharge Precautions/Restrictions  Precautions Precautions: Fall Restrictions Weight Bearing Restrictions: No  Pain Pain Assessment Pain Scale: 0-10 Pain Score: 0-No pain Faces Pain Scale: No hurt ADL ADL Eating: Set up Grooming: Minimal assistance Where Assessed-Grooming: Sitting at sink, Wheelchair Upper Body Bathing: Contact guard Where Assessed-Upper Body Bathing: Shower Lower Body Bathing: Contact guard Where Assessed-Lower Body Bathing: Shower Upper Body Dressing: Minimal assistance Where Assessed-Upper Body Dressing: Sitting at sink, Wheelchair Lower Body Dressing: Minimal assistance Where Assessed-Lower Body Dressing: Sitting at sink, Wheelchair Toileting: Minimal assistance Toilet Transfer: Minimal Print production planner Method: Arts development officer: Radiographer, therapeutic: Minimal Marine scientist Method: Librarian, academic: Other (comment)(BSC in shower) Social research officer, government: Environmental education officer Method: Stand pivot Vision Baseline Vision/History: No visual deficits Patient Visual Report: Peripheral vision impairment Vision Assessment?: Yes Eye Alignment: Within Functional Limits Ocular Range of Motion: Within Functional Limits Alignment/Gaze Preference: Within Defined Limits Tracking/Visual Pursuits: Requires cues, head turns, or add eye shifts to track Visual Fields: Right homonymous hemianopsia(suspect partially due to R inattention, but much improved since eval) Perception  Perception: Impaired Inattention/Neglect: Does not attend to right visual field;Does not attend to right side of body Praxis Praxis: Impaired Praxis Impairment Details: Motor planning Cognition Overall Cognitive Status: Impaired/Different from baseline Arousal/Alertness: Awake/alert Orientation Level: Oriented X4 Attention: Sustained;Selective;Focused Focused Attention: Appears intact Focused Attention Impairment: Functional basic Sustained Attention: Appears intact Selective Attention: Impaired Selective Attention Impairment: Verbal basic;Functional basic Memory: Impaired Memory Impairment: Decreased short term memory Decreased Short Term Memory: Functional basic;Verbal basic Awareness: Impaired Awareness Impairment: Emergent impairment Problem Solving: Impaired Problem Solving Impairment: Verbal basic;Functional basic Safety/Judgment: Impaired Sensation Sensation Light Touch: Appears Intact Proprioception: Appears Intact Coordination Gross Motor Movements are Fluid and Coordinated: No Fine Motor Movements are Fluid and Coordinated: No Coordination and Movement Description: R hemi, motor planning deficits Heel Shin Test: RLE impaired 2/2 weakness Motor  Motor Motor: Hemiplegia;Abnormal tone Mobility  Bed Mobility Bed Mobility: Rolling  Right;Rolling Left;Sit to Supine;Supine to Sit Rolling Right: Supervision/verbal cueing Rolling Left: Minimal Assistance - Patient > 75% Supine to Sit: Minimal Assistance - Patient > 75% Sit to Supine: Minimal Assistance - Patient > 75% Transfers Sit to Stand: Minimal Assistance - Patient > 75% Stand to Sit: Minimal Assistance - Patient > 75%  Trunk/Postural Assessment  Cervical Assessment Cervical Assessment: Exceptions to WFL(cervical stiffness) Thoracic Assessment Thoracic Assessment: Within Functional Limits Lumbar Assessment Lumbar Assessment: Exceptions to WFL(weight on L side) Postural Control Postural Control: Deficits on evaluation  Balance Balance Balance Assessed: Yes Static Sitting Balance Static Sitting - Balance Support: Feet supported;No upper extremity supported Static Sitting - Level of Assistance: 5: Stand by assistance Dynamic Sitting Balance Dynamic Sitting - Level of Assistance: 4: Min assist Static Standing Balance Static Standing - Balance Support: During functional activity;Left upper extremity supported Static Standing - Level of Assistance: 4: Min assist Extremity/Trunk Assessment RUE Assessment RUE Assessment: Exceptions to [place]: Neuro Brunstrum levels for arm and hand: Arm;Hand Brunstrum level for arm: Stage II Synergy is developing Brunstrum level for hand: Stage II Synergy is developing LUE Assessment LUE Assessment: Within Functional Limits   [name] 11/27/2018, 3:47 PM

## 2018-11-27 NOTE — Progress Notes (Signed)
[place] PHYSICAL MEDICINE & REHABILITATION PROGRESS NOTE  Subjective/Complaints:  No new complaints. Up watching videos on his phone. Wanted to look at therapy schedule for today. Indicated that CT marking went without issues yesterday  ROS: limited due to language/communication     Objective: Vital Signs: Blood pressure 127/69, pulse 98, temperature 97.9 F (36.6 C), temperature source Oral, resp. rate 16, height 5\' 7"  (1.702 m), weight 72 kg, SpO2 97 %. No results found. No results for input(s): WBC, HGB, HCT, PLT in the last 72 hours. No results for input(s): NA, K, CL, CO2, GLUCOSE, BUN, CREATININE, CALCIUM in the last 72 hours.  Physical Exam: BP 127/69 (BP Location: Left Arm)   Pulse 98   Temp 97.9 F (36.6 C) (Oral)   Resp 16   Ht 5\' 7"  (1.702 m)   Wt 72 kg   SpO2 97%   BMI 24.86 kg/m  Constitutional: No distress . Vital signs reviewed. HEENT: EOMI, oral membranes moist Neck: supple Cardiovascular: RRR without murmur. No JVD    Respiratory: CTA Bilaterally without wheezes or rales. Normal effort    GI: BS +, non-tender, non-distended  Skin: Warm and dry.  Intact. Psych: alert and smiling/good spirits.  Musc: No edema in extremities.  No tenderness in extremities. Neuro: alert, improving language, talking in phrases and sentences, word subsituttion, seems to be comprehending a lot more---stable exam  Motor:  RUE 1-2/5 prox to tr 1/5 distally and RLE tr-1/5---does better with physical initiation of movement.   Tone tr/4 still on R.  DTR's 2++ Right with clonus  Assessment/Plan: 1. 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  Physiatrist is providing close team supervision and 24 hour management of active medical problems listed below.  Physiatrist and rehab team continue to assess barriers to discharge/monitor patient progress  toward functional and medical goals  Care Tool:  Bathing    Body parts bathed by patient: Right arm, Left arm, Chest, Abdomen, Front perineal area, Buttocks, Left lower leg, Right lower leg, Left upper leg, Right upper leg   Body parts bathed by helper: Left arm     Bathing assist Assist Level: Minimal Assistance - Patient > 75%     Upper Body Dressing/Undressing Upper body dressing   What is the patient wearing?: Pull over shirt    Upper body assist Assist Level: Supervision/Verbal cueing    Lower Body Dressing/Undressing Lower body dressing      What is the patient wearing?: Pants     Lower body assist Assist for lower body dressing: Minimal Assistance - Patient > 75%     Toileting Toileting    Toileting assist Assist for toileting: Contact Guard/Touching assist     Transfers Chair/bed transfer  Transfers assist  Chair/bed transfer activity did not occur: Safety/medical concerns  Chair/bed transfer assist level: Minimal Assistance - Patient > 75%     Locomotion Ambulation   Ambulation assist   Ambulation activity did not occur: Safety/medical concerns  Assist level: Moderate Assistance - Patient 50 - 74% Assistive device: Walker-hemi Max distance: 50'   Walk 10 feet activity   Assist  Walk 10 feet activity did not occur: Safety/medical concerns  Assist level: Moderate Assistance - Patient - 50 - 74% Assistive device: Walker-hemi   Walk 50 feet activity   Assist Walk 50 feet with 2 turns activity did not occur: Safety/medical concerns  Assist level:  Moderate Assistance - Patient - 50 - 74% Assistive device: Walker-hemi    Walk 150 feet activity   Assist Walk 150 feet activity did not occur: Safety/medical concerns         Walk 10 feet on uneven surface  activity   Assist Walk 10 feet on uneven surfaces activity did not occur: Safety/medical concerns         Wheelchair     Assist Will patient use wheelchair at  discharge?: Yes Type of Wheelchair: Manual Wheelchair activity did not occur: Safety/medical concerns  Wheelchair assist level: Minimal Assistance - Patient > 75%, Supervision/Verbal cueing Max wheelchair distance: 100 ft    Wheelchair 50 feet with 2 turns activity    Assist    Wheelchair 50 feet with 2 turns activity did not occur: Safety/medical concerns   Assist Level: Minimal Assistance - Patient > 75%, Supervision/Verbal cueing   Wheelchair 150 feet activity     Assist Wheelchair 150 feet activity did not occur: Safety/medical concerns   Assist Level: Minimal Assistance - Patient > 75%      Medical Problem List and Plan: 1.Right greater than left-sided weaknesssecondary to intracerebral hemorrhage with GBM.Surgery was done emergently due to signs of herniation due to notes.Status post left crani for hematoma evacuation and tumor resection 10/13/2018.   Continue CIR therapies, PT OT speech  -ELOS 11/13  -Patient to see Dr. Ranell Patrick in the office for transitional care encounter in 1-2 weeks.   PRAFO, WHO at night  -Yesterday's MRI demonstrates 58mm residual tumor  -CT simulation yesterday 2. Antithrombotics: -DVT/anticoagulation:Subcutaneous heparin initiated 10/16/2018 -antiplatelet therapy: N/A 3. Pain Management:Tylenol as needed 4. Mood:Provide emotional support -antipsychotic agents: N/A 5. Neuropsych: This patientis not of making decisions on hisown behalf. 6. Skin/Wound Care:Routine skin checks 7. Fluids/Electrolytes/Nutrition:intake 100% 8. Dysphagia. Dysphagia #2 thin liquids. Follow-up speech therapy  Advance diet as tolerated 9. 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 I would continue same doses for now 10. Constipation  Bowel meds as necessary 11. Spasticity  Baclofen 5mg  2 times daily started on  10/9---increased to TID 10/13   -prn dose added for breakthrough spasms 11/4   -improved control of tone as a whole   -continue resting orthotics at HS 12. Hiccups  Baclofen as needed  Improved 13.  Steroid-induced hyperglycemia  Continue to monitor with steroid wean 14.  Hyponatremia  Sodium 135 11/2  -recheck today 15.  Transaminitis  LFTs   stable-recheck today 16.  Leukocytosis-  Steroid induced.  -10/26- WBC 8k--> 13.9 11/2 as expected  -recheck labs today      LOS: 37 days A FACE TO FACE EVALUATION WAS PERFORMED  Meredith Staggers 11/27/2018, 9:23 AM

## 2018-11-27 NOTE — Telephone Encounter (Signed)
Scheduled appt per 11/12 sch message- unable to reach pt .left message with appt date and time   

## 2018-11-27 NOTE — Telephone Encounter (Signed)
Oral Oncology Pharmacist Encounter  Received call from patient's mom stating that she had called [facility] and was informed they would need someone from the office to call with patient's current medication list in order for the temozolomide order to be released for shipment. I called [phone] and spoke with a pharmacist. Medication list from patient's discharge summary reported to [facility]. Pharmacists stated they would need patient's mother to call them back as she had indicated during her previous conversation that she had questions for the pharmacist. I called [name] back, let her know medication list was reported to pharmacy, and instructed her to return call to pharmacist. She will contact me tomorrow if she runs into any further issues.  Johny Drilling, PharmD, BCPS, BCOP  11/27/2018 4:09 PM Oral Oncology Clinic [phone]

## 2018-11-27 NOTE — Progress Notes (Signed)
Speech Language Pathology Discharge Summary  Patient Details  Name: Gregory Moreno MRN: [id number] Date of Birth: [birth year]  Today's Date: 11/27/2018 SLP Individual Time: 0725-0820 SLP Individual Time Calculation (min): 55 min   Skilled Therapeutic Interventions:  Skilled treatment session focused on cognitive-linguistic goals. SLP facilitated session by providing Mod A verbal and visual cues for problem solving with a basic money management task. Patient's function was also impacted by language deficits. Patient answered basic yes/no questions with 80% accuracy and complex yes/no questions with 60% accuracy. Patient also followed 1-step commands with 100% accuracy but followed 2-3 step commands with 0-20% accuracy. 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 Patient with increased ability to name functional items independently with 100% accuracy but performed responsive naming tasks with only 40% accuracy. Patient also performed automatic sequences with 100% accuracy but was unable to perform repetition tasks at the word level. Patient left upright in bed with alarm on and all needs within reach.   Patient has met 8 of 8 long term goals.  Patient to discharge at overall Min;Mod level.   Reasons goals not met: N/A   Clinical Impression/Discharge Summary: Patient has made excellent gains and has met 8 of 8 LTGs this admission. Currently, patient is consuming regular textures with thin liquids without overt s/s of aspiration and supervision level verbal cues for use of swallowing compensatory strategies. Patient requires supervision level verbal cues for sustained attention to functional tasks but requires extra time and Min-Mod A verbal cues for problem solving, recall of functional information and awareness. Patient has made excellent gains in his ability to communicate and can verbalize his wants/needs at the phrase level with extra time and Min-Mod A verbal and phonemic cues needed to  self-monitor and correct verbal/phonemic errors. Patient's intelligibility can also be reduced at times due to a low vocal intensity requiring Min verbal cues for use of an increased vocal intensity to maximize intelligibility at the phrase level. Patient and family education is complete and patient will discharge home with 24 hour supervision from family. Patient would benefit from f/u SLP services to maximize his cognitive-linguistic function and overall functional independence in order to reduce caregiver burden.   Care Partner:  Caregiver Able to Provide Assistance: Yes  Type of Caregiver Assistance: Cognitive  Recommendation:  Home Health SLP;24 hour supervision/assistance;Outpatient SLP  Rationale for SLP Follow Up: Maximize functional communication;Maximize cognitive function and independence;Reduce caregiver burden   Equipment: N/A   Reasons for discharge: Discharged from hospital;Treatment goals met   Patient/Family Agrees with Progress Made and Goals Achieved: Yes    Rolene Andrades 11/27/2018, 7:04 AM

## 2018-11-27 NOTE — Progress Notes (Signed)
Occupational Therapy Session Note  Patient Details  Name: Gregory Moreno MRN: [id number] Date of Birth: 26-Aug-1995  Today's Date: 11/27/2018 OT Individual Time:  - 1405-1505      Short Term Goals: Week 5:  OT Short Term Goal 1 (Week 5): Continue progressing toward established LTG  Skilled Therapeutic Interventions/Progress Updates:   Pt received using the restroom; voided urine and BM. Pt is min A for toileting. Completed stand pivot transfers throughout session all with min A and min VC for hand placement for safety. Pt completed UB bathing with (S) and demonstrated use of R hand for washing UB and manipulating soap container to apply soap onto cloth. Pt completed LB bathing with CGA for balance when bending over to wash B LE. Pt completed oral care seated in w/c at sink with (S). Completed UB dressing with min A for hemi technique and LB dressing with min A to recall crossing legs and threading foot through R LE. Pt requesting to get into recliner; completed stand pivot with min A. Mom present during session and reporting she feels that she is prepared for d/c and has no further questions regarding pt care at home. End of session pt left in recliner, call bell in reach and all needs met.    Therapy Documentation Precautions:  Precautions Precautions: Fall Precaution Comments: L crani Restrictions Weight Bearing Restrictions: No  No c/o pain in session       Therapy/Group: Individual Therapy  Olivia Ward 11/27/2018, 3:45 PM  This note has been reviewed and this clinician agrees with information provided.

## 2018-11-27 NOTE — Progress Notes (Signed)
Physical Therapy Note  Patient Details  Name: Gregory Moreno MRN: [id number] Date of Birth: 07/29/1995 Today's Date: 11/26/2018 PT Missed Time: 45 min Missed Time Reason: unavailable (off the floor for ancillary services)    Attempted to see patient for scheduled PT session. Pt off the floor for ancillary services. Continue per POC. Pt missed 45 min of scheduled therapy time.   Excell Seltzer, PT, DPT  11/26/2018, 12:45 PM

## 2018-11-27 NOTE — Telephone Encounter (Signed)
Oral Oncology Pharmacist Encounter  I called [facility] at [phone] to follow-up on status of temozolomide prescriptions. Representative states that the 20mg  capsule Rx was keyed correctly to patient's account and the 100mg  capsule Rx was keyed to an incorrect account with the correct name, but incorrect patient date of birth.  I ended up having to give a verbal order for the 100mg  capsules.  Family can call [facility] at [phone] after 3pm today to schedule 1st fill of temozolomide.  I returned call to patient's mother and reported above information. She will contact the pharmacy after 3pm today and will call me back if she runs in to any issues.  Johny Drilling, PharmD, BCPS, BCOP  11/27/2018 12:45 PM Oral Oncology Clinic [phone]

## 2018-11-27 NOTE — Telephone Encounter (Signed)
Oral Chemotherapy Pharmacist Encounter   I spoke with patient's mother, [name], for overview of: Temodar for the treatment of glioblastoma multiforme in conjunction with radiation, planned duration concomitant phase 42 days of therapy.  Patient will likely continue on Temodar for maintenance treatment for 6-12 cycles after completion of concomitant phase.  Counseled on administration, dosing, side effects, monitoring, drug-food interactions, safe handling, storage, and disposal.  Patient will take Temodar 20mg  capsules and Temodar 100mg  capsules, 120 mg total daily dose, by mouth once daily, may take at bedtime and on an empty stomach to decrease nausea and vomiting.  Patient will take Temodar concurrent with radiation for 42 days straight.  Temodar and radiation start date: 12/01/2018   Patient will take Zofran 8mg  tablet, 1 tablet by mouth 30-60 min prior to Temodar dose to help decrease N/V once starting adjuvant therapy.  Prophylactic Zofran will not be used at initiation of concurrent phase, but will be initiated if nausea develops despite Temodar administration on an empty stomach and at bedtime. Mrs. Kondo [facility] [place].   Adverse effects include but are not limited to: nausea, vomiting, anorexia, GI upset, rash, drug fever, and fatigue. Rare but serious adverse effects of pneumocystis pneumonia and secondary malignancy also discussed.  PCP prophylaxis will not be initiated at this time, but may be added based on lymphocyte count in the future.  Reviewed importance of keeping a medication schedule and plan for any missed doses.  Medication reconciliation performed and medication/allergy list updated.  Insurance authorization for Temodar has been obtained. Prescription is being filled at [facility] per insurance requirement. They have not yet heard from dispensing pharmacy to schedule 1st shipment. I will contact  dispensing pharmacy and update Mrs. Danza with any information.  All questions answered.  Mrs. Kraushaar voiced understanding and appreciation.   They know to call the office with questions or concerns.  Johny Drilling, PharmD, BCPS, BCOP  11/27/2018  12:06 PM [facility]cology Clinic [phone]

## 2018-11-28 NOTE — Progress Notes (Signed)
[place] PHYSICAL MEDICINE & REHABILITATION PROGRESS NOTE  Subjective/Complaints:  Up in bed. Excited to be going home. Thanked me for care.   ROS: Patient denies fever, rash, sore throat, blurred vision, nausea, vomiting, diarrhea, cough, shortness of breath or chest pain, joint or back pain, headache, or mood change.   Objective: Vital Signs: Blood pressure 114/64, pulse 75, temperature 98.2 F (36.8 C), resp. rate 20, height 5\' 7"  (1.702 m), weight 72 kg, SpO2 98 %. No results found. Recent Labs    11/27/18 0936  WBC 23.0*  HGB 13.7  HCT 42.8  PLT 173   Recent Labs    11/27/18 0936  NA 133*  K 4.1  CL 100  CO2 21*  GLUCOSE 99  BUN 22*  CREATININE 0.79  CALCIUM 9.2    Physical Exam: BP 114/64 (BP Location: Left Arm)   Pulse 75   Temp 98.2 F (36.8 C)   Resp 20   Ht 5\' 7"  (1.702 m)   Wt 72 kg   SpO2 98%   BMI 24.86 kg/m  Constitutional: No distress . Vital signs reviewed. HEENT: EOMI, oral membranes moist Neck: supple Cardiovascular: RRR without murmur. No JVD    Respiratory: CTA Bilaterally without wheezes or rales. Normal effort    GI: BS +, non-tender, non-distended  Skin: Warm and dry.  Intact. Psych: alert and smiling/good spirits.  Musc: No edema in extremities.  No tenderness in extremities. Neuro: alert, improving language with better content and initiation. Talking in longer sentences Motor:  RUE 2 to 2+/5 prox to distal and RLE 1/5--     Tone tr/4  on R.  DTR's 2++ Right LE with clonus  Assessment/Plan: 1. 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  Physiatrist is providing close team supervision and 24 hour management of active medical problems listed below.  Physiatrist and rehab team continue to assess barriers to discharge/monitor patient progress toward functional and medical goals  Care  Tool:  Bathing    Body parts bathed by patient: Right arm, Left arm, Chest, Abdomen, Front perineal area, Buttocks, Left lower leg, Right lower leg, Left upper leg, Right upper leg, Face   Body parts bathed by helper: Left arm     Bathing assist Assist Level: Minimal Assistance - Patient > 75%     Upper Body Dressing/Undressing Upper body dressing   What is the patient wearing?: Pull over shirt    Upper body assist Assist Level: Supervision/Verbal cueing    Lower Body Dressing/Undressing Lower body dressing      What is the patient wearing?: Pants     Lower body assist Assist for lower body dressing: Minimal Assistance - Patient > 75%     Toileting Toileting    Toileting assist Assist for toileting: Minimal Assistance - Patient > 75%     Transfers Chair/bed transfer  Transfers assist  Chair/bed transfer activity did not occur: Safety/medical concerns  Chair/bed transfer assist level: Moderate Assistance - Patient 50 - 74%     Locomotion Ambulation   Ambulation assist   Ambulation activity did not occur: Safety/medical concerns  Assist level: Moderate Assistance - Patient 50 - 74% Assistive device: Walker-hemi Max distance: 50'   Walk 10 feet activity   Assist  Walk 10 feet activity did not occur: Safety/medical concerns  Assist level: Moderate Assistance - Patient - 50 - 74% Assistive device: [employer]  Walk 50 feet activity   Assist Walk 50 feet with 2 turns activity did not occur: Safety/medical concerns  Assist level: Moderate Assistance - Patient - 50 - 74% Assistive device: Walker-hemi    Walk 150 feet activity   Assist Walk 150 feet activity did not occur: Safety/medical concerns         Walk 10 feet on uneven surface  activity   Assist Walk 10 feet on uneven surfaces activity did not occur: Safety/medical concerns         Wheelchair     Assist Will patient use wheelchair at discharge?: Yes Type of Wheelchair:  Manual Wheelchair activity did not occur: Safety/medical concerns  Wheelchair assist level: Contact Guard/Touching assist Max wheelchair distance: 100'    Wheelchair 50 feet with 2 turns activity    Assist    Wheelchair 50 feet with 2 turns activity did not occur: Safety/medical concerns   Assist Level: Contact Guard/Touching assist   Wheelchair 150 feet activity     Assist Wheelchair 150 feet activity did not occur: Safety/medical concerns   Assist Level: Minimal Assistance - Patient > 75%      Medical Problem List and Plan: 1.Right greater than left-sided weaknesssecondary to intracerebral hemorrhage with GBM.Surgery was done emergently due to signs of herniation due to notes.Status post left crani for hematoma evacuation and tumor resection 10/13/2018.   -dc home today  -Patient to see Dr. Ranell Patrick in the office for transitional care encounter in 1-2 weeks.   -Neuro-onc, NS, rad-onc f/u for XRT 2. Antithrombotics: -DVT/anticoagulation:Subcutaneous heparin initiated 10/16/2018--dc -antiplatelet therapy: N/A 3. Pain Management:Tylenol as needed 4. Mood:Provide emotional support -antipsychotic agents: N/A 5. Neuropsych: This patientis not of making decisions on hisown behalf. 6. Skin/Wound Care:Routine skin checks 7. Fluids/Electrolytes/Nutrition:intake 100% 8. Dysphagia. Dysphagia #2 thin liquids. Follow-up speech therapy  Advance diet as tolerated 9. 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 Constipation  Bowel meds as necessary 11. Spasticity  Baclofen 5mg  TID--continue at home 12. Hiccups  Baclofen as needed  Improved 13.  Steroid-induced hyperglycemia  Continue to monitor with steroid wean 14.  Hyponatremia  Sodium 133 11/12    15.  Transaminitis  ALT up to 84---can follow up as outpt 16.  Leukocytosis-  Wbc's  23k 11/12  -steroid induced  -monitor per onc as outpt     LOS: 38 days A FACE TO [place] 11/28/2018, 8:46 AM

## 2018-11-28 NOTE — Progress Notes (Addendum)
Social Work Discharge Note   The overall goal for the admission was met for:   Discharge location: Yes - home with parents to provide 24/7 assistance  Length of Stay: Yes - 38 days  Discharge activity level: Yes - minimal assistance overall  Home/community participation: Yes  Services provided included: MD, RD, PT, OT, SLP, RN, TR, Pharmacy and [place]: Private Insurance: [facility]  Follow-up services arranged: Home Health: PT, OT, ST via [facility], Outpatient: PT, OT, ST via [facility] (to begin the first week in Dec), DME: 16x16 lightweight w/c, cushion, hemi walker, drop arm commode and tub bench via [employer] and Patient/Family has no preference for HH/DME agencies  Comments (or additional information):    Contacts:  Mother, Naveen Clardy @ [phone] and father, Keziah Drotar @ [phone]  Patient/Family verbalized understanding of follow-up arrangements: Yes  Individual responsible for coordination of the follow-up plan: pt/parents  Confirmed correct DME delivered: Lennart Pall 11/28/2018    Wilkinson, Lorre Nick

## 2018-11-28 NOTE — Discharge Instructions (Signed)
Inpatient Rehab Discharge Instructions  Gregory Moreno Discharge date and time: No discharge date for patient encounter.   Activities/Precautions/ Functional Status: Activity: activity as tolerated Diet:  Wound Care: keep wound clean and dry Functional status:  ___ No restrictions     ___ Walk up steps independently ___ 24/7 supervision/assistance   ___ Walk up steps with assistance ___ Intermittent supervision/assistance  ___ Bathe/dress independently ___ Walk with walker     _x__ Bathe/dress with assistance ___ Walk Independently    ___ Shower independently ___ Walk with assistance    ___ Shower with assistance ___ No alcohol     ___ Return to work/school ________    COMMUNITY REFERRALS UPON DISCHARGE:    Home Health:   PT     OT     ST                     Agency:  [facility]    Phone: [phone]   Outpatient: PT     OT     ST                   Agency:  [facility]   Phone: [phone]                Appointment Date/Time: sessions to begin the first week of Dec - to contact parents with appointment information  Medical Equipment/Items Ordered:  Wheelchair, cushion, hemi walker, commode and tub bench                                                       Agency/Supplier:  [facility] @ [phone]     Special Instructions: No driving smoking or alcohol   My questions have been answered and I understand these instructions. I will adhere to these goals and the provided educational materials after my discharge from the hospital.  Patient/Caregiver Signature _______________________________ Date __________  Clinician Signature _______________________________________ Date __________  Please bring this form and your medication list with you to all your follow-up doctor's appointments.

## 2018-11-28 NOTE — Progress Notes (Signed)
Pt is ready for d/c. Pt dad is at beside with belongings. D/C summary given by PA Dan. Pt is being taken to car by Fransisco Beau and Armed forces training and education officer. No pain or distress noted. No other questions noted.

## 2018-11-30 DIAGNOSIS — C712 Malignant neoplasm of temporal lobe: Secondary | ICD-10-CM | POA: Diagnosis not present

## 2018-12-01 ENCOUNTER — Ambulatory Visit
Admission: RE | Admit: 2018-12-01 | Discharge: 2018-12-01 | Disposition: A | Discharge status: Home / Self Care | Payer: 59 | Source: Ambulatory Visit | Attending: Radiation Oncology | Admitting: Radiation Oncology

## 2018-12-01 ENCOUNTER — Other Ambulatory Visit: Payer: Self-pay

## 2018-12-01 DIAGNOSIS — C712 Malignant neoplasm of temporal lobe: Secondary | ICD-10-CM | POA: Diagnosis not present

## 2018-12-02 ENCOUNTER — Telehealth: Payer: Self-pay

## 2018-12-02 ENCOUNTER — Telehealth: Payer: Self-pay | Admitting: *Deleted

## 2018-12-02 ENCOUNTER — Ambulatory Visit
Admission: RE | Admit: 2018-12-02 | Discharge: 2018-12-02 | Disposition: A | Discharge status: Home / Self Care | Payer: 59 | Source: Ambulatory Visit | Attending: Radiation Oncology | Admitting: Radiation Oncology

## 2018-12-02 ENCOUNTER — Other Ambulatory Visit: Payer: Self-pay

## 2018-12-02 DIAGNOSIS — C712 Malignant neoplasm of temporal lobe: Secondary | ICD-10-CM | POA: Diagnosis not present

## 2018-12-02 NOTE — Telephone Encounter (Signed)
[facility] confirmed shipment of Temodar to patients residence.    Returned call to patients father to reiterate dosing instructions.  No further questions.

## 2018-12-02 NOTE — Telephone Encounter (Signed)
Called and spoke to mother of patient. She stated she didn't have time to answer questions and didn't have any to write appt down. Asked for it to be emailed or it was on my chart. I told her it should be on my chart and that paperwork was mailed with appt card.

## 2018-12-03 ENCOUNTER — Other Ambulatory Visit: Payer: Self-pay

## 2018-12-03 ENCOUNTER — Ambulatory Visit
Admission: RE | Admit: 2018-12-03 | Discharge: 2018-12-03 | Disposition: A | Discharge status: Home / Self Care | Payer: 59 | Source: Ambulatory Visit | Attending: Radiation Oncology | Admitting: Radiation Oncology

## 2018-12-03 DIAGNOSIS — C712 Malignant neoplasm of temporal lobe: Secondary | ICD-10-CM | POA: Diagnosis not present

## 2018-12-04 ENCOUNTER — Ambulatory Visit
Admission: RE | Admit: 2018-12-04 | Discharge: 2018-12-04 | Disposition: A | Discharge status: Home / Self Care | Payer: 59 | Source: Ambulatory Visit | Attending: Radiation Oncology | Admitting: Radiation Oncology

## 2018-12-04 ENCOUNTER — Telehealth: Payer: Self-pay

## 2018-12-04 ENCOUNTER — Other Ambulatory Visit: Payer: Self-pay

## 2018-12-04 DIAGNOSIS — C712 Malignant neoplasm of temporal lobe: Secondary | ICD-10-CM | POA: Diagnosis not present

## 2018-12-04 NOTE — Telephone Encounter (Signed)
Joey, PT from [facility] called requesting [facility] HHPT 1wk3. Anna Genre, ST from [facility] called requesting [facility] HHST 1wk2, 2wk4. Orders approved and given per discharge summary.

## 2018-12-05 ENCOUNTER — Ambulatory Visit
Admission: RE | Admit: 2018-12-05 | Discharge: 2018-12-05 | Disposition: A | Discharge status: Home / Self Care | Payer: 59 | Source: Ambulatory Visit | Attending: Radiation Oncology | Admitting: Radiation Oncology

## 2018-12-05 ENCOUNTER — Other Ambulatory Visit: Payer: Self-pay

## 2018-12-05 DIAGNOSIS — C712 Malignant neoplasm of temporal lobe: Secondary | ICD-10-CM | POA: Diagnosis not present

## 2018-12-07 ENCOUNTER — Other Ambulatory Visit: Payer: Self-pay

## 2018-12-07 ENCOUNTER — Ambulatory Visit
Admission: RE | Admit: 2018-12-07 | Discharge: 2018-12-07 | Disposition: A | Discharge status: Home / Self Care | Payer: 59 | Source: Ambulatory Visit | Attending: Radiation Oncology | Admitting: Radiation Oncology

## 2018-12-07 DIAGNOSIS — C712 Malignant neoplasm of temporal lobe: Secondary | ICD-10-CM | POA: Diagnosis not present

## 2018-12-08 ENCOUNTER — Other Ambulatory Visit: Payer: Self-pay

## 2018-12-08 ENCOUNTER — Ambulatory Visit
Admission: RE | Admit: 2018-12-08 | Discharge: 2018-12-08 | Disposition: A | Discharge status: Home / Self Care | Payer: 59 | Source: Ambulatory Visit | Attending: Radiation Oncology | Admitting: Radiation Oncology

## 2018-12-08 DIAGNOSIS — C712 Malignant neoplasm of temporal lobe: Secondary | ICD-10-CM | POA: Diagnosis not present

## 2018-12-09 ENCOUNTER — Ambulatory Visit
Admission: RE | Admit: 2018-12-09 | Discharge: 2018-12-09 | Disposition: A | Discharge status: Home / Self Care | Payer: 59 | Source: Ambulatory Visit | Attending: Radiation Oncology | Admitting: Radiation Oncology

## 2018-12-09 ENCOUNTER — Encounter: Payer: 59 | Attending: Physical Medicine and Rehabilitation | Admitting: Physical Medicine and Rehabilitation

## 2018-12-09 ENCOUNTER — Other Ambulatory Visit: Payer: Self-pay

## 2018-12-09 VITALS — BP 128/79 | HR 102 | Temp 98.9°F | Ht 67.0 in | Wt 158.7 lb

## 2018-12-09 DIAGNOSIS — N3944 Nocturnal enuresis: Secondary | ICD-10-CM | POA: Diagnosis not present

## 2018-12-09 DIAGNOSIS — C712 Malignant neoplasm of temporal lobe: Secondary | ICD-10-CM | POA: Insufficient documentation

## 2018-12-09 MED ORDER — BACLOFEN 10 MG PO TABS: 10.0000 mg | ORAL_TABLET | Freq: Every evening | ORAL | 0 refills | Status: DC | PRN

## 2018-12-09 MED ORDER — MIRABEGRON ER 25 MG PO TB24: 25.0000 mg | ORAL_TABLET | Freq: Every day | ORAL | 0 refills | Status: DC

## 2018-12-09 MED ORDER — MIRABEGRON ER 25 MG PO TB24: 25.0000 mg | ORAL_TABLET | Freq: Every day | ORAL | Status: DC

## 2018-12-09 NOTE — Addendum Note (Signed)
Addended by: Izora Ribas on: 12/09/2018 05:22 PM   Modules accepted: Orders

## 2018-12-09 NOTE — Progress Notes (Signed)
Subjective:    Patient ID: Gregory Moreno, male    DOB: 1995/11/06, 23 y.o.   MRN: [id number]  HPI Gregory Moreno presents for hospital follow-up: Gregory Moreno is a 23 y.o. right-handed male with unremarkable past medical history.  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  Parents with good support.  Presented 10/13/2018 with intermittent headaches and right-sided weakness.  No reports of any recent trauma.  CT and MRI and imaging showed left temporal lobe hematoma with surrounding infiltrative masslike appearance favoring glioma.  CT angiogram of head and neck with no aneurysm noted.  Underwent left craniotomy for hematoma evacuation and tumor resection 10/13/2018 per Dr. Venetia Constable.  Per mother no speech since surgery he was mumbling a few words.  Maintained on Decadron protocol.  Patient did have some leukocytosis 21,000 felt to be induced by steroids.  Pathology report was pending.  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  Dysphagia #2 thin liquid diet.  Subcutaneous heparin for DVT prophylaxis initiated 10/16/2018.  Patient was admitted for a comprehensive rehab program   Hospital Course: Gregory Moreno was admitted to rehab 10/21/2018 for inpatient therapies to consist of PT, ST and OT at least three hours five days a week. Past admission physiatrist, therapy team and rehab RN have worked together to provide customized collaborative inpatient rehab.  Pertaining to patient intracerebral hemorrhage pathology report GBM and patient followed by Dr. Venetia Constable of neurosurgery.  He had undergone hematoma evacuation and tumor resection 10/13/2018.  Follow-up MRI improved postoperative changes he remained on Decadron protocol plan was for XRT and CTX per radiation oncology Dr. [name].  Was maintained on subcutaneous heparin for DVT prophylaxis.  No bleeding episodes.  Pain managed with use of Tylenol.  He was on a  dysphagia #2 thin liquid diet and diet advanced to regular.  Patient noted aphasia follow-up speech therapy he was placed on Ritalin as well as amantadine/lexapro for initiation and poor attention with mood stabilization   Bouts of constipation resolved with laxative assistance.  Noted spasticity baclofen titrated accordingly.  Bouts of hyperglycemia felt to be steroid related.  His father accompanies him and reports noctural enuresis. He uses diapers but the volume of urine is high and his wife usually changes him every night.  He has been getting daily radiation without side effects.  He continues to have good appetite, denies pain, constipation, depression, or anxiety.  Unfortunately he has not had much home therapy because of this holiday week. He will be transitioning to outpatient PT, OT, and SLP next week. His father notes that when he has therapy he moves much better than on days when he doesn't. His right arm and leg strength continue to improve, but he still makes many errors in language.   Pain Inventory Average Pain 0 Pain Right Now 0 My pain is no pain  In the last 24 hours, has pain interfered with the following? General activity 0 Relation with others 0 Enjoyment of life 0 What TIME of day is your pain at its worst? no pain Sleep (in general) NA  Pain is worse with: no pain Pain improves with: no pain Relief from Meds: no pain  Mobility use a walker ability to climb steps?  no do you drive?  no use a wheelchair needs help with transfers  Function I need assistance with the following:  feeding, dressing, bathing, toileting, meal prep, household duties and shopping  Neuro/Psych spasms  Prior Studies Any changes since last visit?  no  Physicians involved in your care Any changes since last visit?  no   No family history on file. Social History   Socioeconomic History   Marital status: Single    Spouse name: Not on file   Number of children: Not on  file   Years of education: Not on file   Highest education level: Not on file  Occupational History   Not on file  Social Needs   Financial resource strain: Not on file   Food insecurity    Worry: Not on file    Inability: Not on file   Transportation needs    Medical: Not on file    Non-medical: Not on file  Tobacco Use   Smoking status: Never Smoker   Smokeless tobacco: Never Used  Substance and Sexual Activity   Alcohol use: Not on file   Drug use: Not on file   Sexual activity: Not on file  Lifestyle   Physical activity    Days per week: Not on file    Minutes per session: Not on file   Stress: Not on file  Relationships   Social connections    Talks on phone: Not on file    Gets together: Not on file    Attends religious service: Not on file    Active member of club or organization: Not on file    Attends meetings of clubs or organizations: Not on file    Relationship status: Not on file  Other Topics Concern   Not on file  Social History Narrative   Not on file   Past Surgical History:  Procedure Laterality Date   CRANIOTOMY Left 10/13/2018   Procedure: LEFT CRANIOTOMY FOR TUMOR RESECTION;  Surgeon: Judith Part, MD;  Location: [facility];  Service: Neurosurgery;  Laterality: Left;   No past medical history on file. BP 128/79    Pulse (!) 102    Temp 98.9 F (37.2 C)    Ht 5\' 7"  (1.702 m) Comment: last recorded   Wt 158 lb 11.2 oz (72 kg) Comment: last recorded   SpO2 98%    BMI 24.86 kg/m   Opioid Risk Score:   Fall Risk Score:  `1  Depression screen PHQ 2/9  Depression screen PHQ 2/9 12/09/2018  Decreased Interest 0  Down, Depressed, Hopeless 0  PHQ - 2 Score 0  Altered sleeping 0  Tired, decreased energy 0  Change in appetite 0  Feeling bad or failure about yourself  0  Trouble concentrating 0  Moving slowly or fidgety/restless 0  Suicidal thoughts 0  PHQ-9 Score 0    Review of Systems  Constitutional: Negative.   HENT:  Negative.   Eyes: Negative.   Respiratory: Negative.   Cardiovascular: Negative.   Gastrointestinal: Negative.   Endocrine: Negative.   Genitourinary: Negative.   Musculoskeletal:       Spasms  Skin: Negative.   Allergic/Immunologic: Negative.   Neurological: Negative.   Hematological: Negative.   Psychiatric/Behavioral: Negative.   All other systems reviewed and are negative.      Objective:   Physical Exam  Gen: no distress, normal appearing HEENT: oral mucosa pink and moist, NCAT Cardio: Reg rate Chest: normal effort, normal rate of breathing Abd: soft, non-distended Ext: no edema Skin: intact Neuro: alert. Follows commands. Musculoskeletal: 5/5 strength on left side, 3/5 in right arm and 0/5 in right leg.  Psych: pleasant, flat affect  Assessment & Plan:  Gregory Moreno is a 23 year old man who presents for follow-up after his hospital admission for glioblastoma.  Incontinence: Prescribed Myrbetriq to reduce noctural enuresis. 25mg  HS Continue wearing diapers at night. Minimize water drunk at night time and maximize water drunk during the day time. Ensure use of bathroom before sleeping at night.  Cognitive deficits: Continue SLP to work on memory, attention, neologisms, Continue Ritalin 10mg  daily to help with initiation and attention.  Impaired gait and mobility: Continue PT and OT to work on right sided strength. Once stronger, patient would love to join father on plane trips.  Discussed risk of caregiver burnout and strategies to avoid. Patient is currently with excellent appetite, sleeping well, and without disturbing side effects from his chemo and radiation. He remains pain free. Will also increase baclofen to 10mg  HS to help with nightly spasms. Advised that it should be used PRN.   Forty-five minutes of face to face patient care time were spent during this visit. All questions were encouraged and answered. Follow up with me in 4 weeks.

## 2018-12-10 ENCOUNTER — Other Ambulatory Visit: Payer: Self-pay

## 2018-12-10 ENCOUNTER — Ambulatory Visit
Admission: RE | Admit: 2018-12-10 | Discharge: 2018-12-10 | Disposition: A | Discharge status: Home / Self Care | Payer: 59 | Source: Ambulatory Visit | Attending: Radiation Oncology | Admitting: Radiation Oncology

## 2018-12-10 DIAGNOSIS — C712 Malignant neoplasm of temporal lobe: Secondary | ICD-10-CM | POA: Diagnosis not present

## 2018-12-15 ENCOUNTER — Ambulatory Visit
Admission: RE | Admit: 2018-12-15 | Discharge: 2018-12-15 | Disposition: A | Discharge status: Home / Self Care | Payer: 59 | Source: Ambulatory Visit | Attending: Radiation Oncology | Admitting: Radiation Oncology

## 2018-12-15 ENCOUNTER — Telehealth: Payer: Self-pay | Admitting: *Deleted

## 2018-12-15 ENCOUNTER — Other Ambulatory Visit: Payer: Self-pay

## 2018-12-15 DIAGNOSIS — C712 Malignant neoplasm of temporal lobe: Secondary | ICD-10-CM | POA: Diagnosis not present

## 2018-12-15 NOTE — Telephone Encounter (Signed)
Mallory, OT, [facility] left a message asking for verbal orders for HHOT 1wk2. Medical record reviewed. Social work note reviewed.  Verbal orders given per office protocol.

## 2018-12-16 ENCOUNTER — Inpatient Hospital Stay: Payer: 59 | Attending: Internal Medicine | Admitting: Internal Medicine

## 2018-12-16 ENCOUNTER — Inpatient Hospital Stay: Payer: 59

## 2018-12-16 ENCOUNTER — Ambulatory Visit
Admission: RE | Admit: 2018-12-16 | Discharge: 2018-12-16 | Disposition: A | Discharge status: Home / Self Care | Payer: 59 | Source: Ambulatory Visit | Attending: Radiation Oncology | Admitting: Radiation Oncology

## 2018-12-16 ENCOUNTER — Other Ambulatory Visit: Payer: Self-pay

## 2018-12-16 VITALS — BP 130/88 | HR 95 | Temp 98.3°F | Resp 17 | Ht 67.0 in | Wt 161.3 lb

## 2018-12-16 DIAGNOSIS — Z79899 Other long term (current) drug therapy: Secondary | ICD-10-CM | POA: Diagnosis not present

## 2018-12-16 DIAGNOSIS — Z9989 Dependence on other enabling machines and devices: Secondary | ICD-10-CM | POA: Insufficient documentation

## 2018-12-16 DIAGNOSIS — Z510 Encounter for antineoplastic radiation therapy: Secondary | ICD-10-CM | POA: Diagnosis not present

## 2018-12-16 DIAGNOSIS — C712 Malignant neoplasm of temporal lobe: Secondary | ICD-10-CM | POA: Insufficient documentation

## 2018-12-16 DIAGNOSIS — Z7189 Other specified counseling: Secondary | ICD-10-CM

## 2018-12-16 DIAGNOSIS — Z923 Personal history of irradiation: Secondary | ICD-10-CM

## 2018-12-16 LAB — CMP (CANCER CENTER ONLY)
ALT: 86 U/L — ABNORMAL HIGH (ref 0–44)
AST: 18 U/L (ref 15–41)
Albumin: 3.4 g/dL — ABNORMAL LOW (ref 3.5–5.0)
Alkaline Phosphatase: 60 U/L (ref 38–126)
Anion gap: 12 (ref 5–15)
BUN: 23 mg/dL — ABNORMAL HIGH (ref 6–20)
CO2: 25 mmol/L (ref 22–32)
Calcium: 8.8 mg/dL — ABNORMAL LOW (ref 8.9–10.3)
Chloride: 100 mmol/L (ref 98–111)
Creatinine: 0.62 mg/dL (ref 0.61–1.24)
GFR, Est AFR Am: >60 mL/min (ref >60)
GFR, Estimated: >60 mL/min (ref >60)
Glucose, Bld: 104 mg/dL — ABNORMAL HIGH (ref 70–99)
Potassium: 4.4 mmol/L (ref 3.5–5.1)
Sodium: 137 mmol/L (ref 135–145)
Total Bilirubin: 0.4 mg/dL (ref 0.3–1.2)
Total Protein: 6.4 g/dL — ABNORMAL LOW (ref 6.5–8.1)

## 2018-12-16 LAB — CBC WITH DIFFERENTIAL (CANCER CENTER ONLY)
Abs Immature Granulocytes: 1.58 10*3/uL — ABNORMAL HIGH (ref 0.00–0.07)
Basophils Absolute: 0.1 10*3/uL (ref 0.0–0.1)
Basophils Relative: 1 %
Eosinophils Absolute: 0 10*3/uL (ref 0.0–0.5)
Eosinophils Relative: 0 %
HCT: 46.2 % (ref 39.0–52.0)
Hemoglobin: 14.8 g/dL (ref 13.0–17.0)
Immature Granulocytes: 11 %
Lymphocytes Relative: 7 %
Lymphs Abs: 1 10*3/uL (ref 0.7–4.0)
MCH: 28.2 pg (ref 26.0–34.0)
MCHC: 32 g/dL (ref 30.0–36.0)
MCV: 88 fL (ref 80.0–100.0)
Monocytes Absolute: 1.2 10*3/uL — ABNORMAL HIGH (ref 0.1–1.0)
Monocytes Relative: 9 %
Neutro Abs: 10.3 10*3/uL — ABNORMAL HIGH (ref 1.7–7.7)
Neutrophils Relative %: 72 %
Platelet Count: 122 10*3/uL — ABNORMAL LOW (ref 150–400)
RBC: 5.25 MIL/uL (ref 4.22–5.81)
RDW: 15.8 % — ABNORMAL HIGH (ref 11.5–15.5)
WBC Count: 14.2 10*3/uL — ABNORMAL HIGH (ref 4.0–10.5)
nRBC: 0.2 % (ref 0.0–0.2)

## 2018-12-16 NOTE — Progress Notes (Signed)
[facility] [place], [place] [address] [phone]   New Patient Evaluation  Date of Service: 12/16/18 Patient Name: Gregory Moreno Patient MRN: [id number] Patient DOB: 02-Jun-1995 Provider: Ventura Sellers, MD  Identifying Statement:  [name] is a 23 y.o. male with left temporal glioblastoma   Referring Provider: [place] [address] 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      Biomarkers:  MGMT Unknown.  IDH 1/2 Wild type.  EGFR Unknown  TERT Unknown   Interval History:  Gregory Moreno presents today for follow up, now having completed week #2 of radiation therapy and concurrent Temodar.  He continues to improve strength in his right side.  He is able to ambulate minimally around the home, slowly, with a supportive belt and a spotter.  Otherwise he utilizes the walker or wheelchair.  He does still acknowledge dense impairment of vision on the right side.  Language also is slowly improving, he has good understanding but struggles to put sentences together.  Has gained weight and has puffiness in the face compared to last month.  No issues with Temodar.  Medications: Current Outpatient Medications on File Prior to Visit  Medication Sig Dispense Refill   acetaminophen (TYLENOL) 325 MG tablet Take 2 tablets (650 mg total) by mouth every 4 (four) hours as needed for mild pain (temp > 100.5).     amantadine (SYMMETREL) 100 MG capsule Take 1 capsule (100 mg total) by mouth 2 (two) times daily with breakfast and lunch. 60 capsule 1   baclofen (LIORESAL) 10 MG tablet Take 1 tablet (10 mg total) by  mouth at bedtime as needed for muscle spasms. 30 each 0   dexamethasone (DECADRON) 4 MG tablet Take 1 tablet (4 mg total) by mouth every 12 (twelve) hours. 60 tablet 0   escitalopram (LEXAPRO) 5 MG tablet Take 1 tablet (5 mg total) by mouth at bedtime. 30 tablet 0   loratadine (CLARITIN) 10 MG tablet Take 10 mg by mouth daily as needed for allergies.     methylphenidate (RITALIN) 10 MG tablet Take 1 tablet (10 mg total) by mouth 2 (two) times daily with breakfast and lunch. 60 tablet 0   mirabegron ER (MYRBETRIQ) 25 MG TB24 tablet Take 1 tablet (25 mg total) by mouth daily. 30 tablet 0   ondansetron (ZOFRAN) 4 MG tablet Take 1 tablet (4 mg total) by mouth every 4 (four) hours as needed for nausea or vomiting. (Patient not taking: Reported on 12/09/2018) 60 tablet 0   pantoprazole (PROTONIX) 40 MG tablet Take 1 tablet (40 mg total) by mouth daily. 30 tablet 0   senna-docusate (SENOKOT-S) 8.6-50 MG tablet Take 1 tablet by mouth 2 (two) times daily.     temozolomide (TEMODAR) 100 MG capsule Take 121m capsule & 271mcap (12065motal) by mouth once daily. May take on empty stomach to decrease nausea & vomiting 42 capsule 0   temozolomide (TEMODAR) 20 MG capsule Take 55m59mpsule & 100mg50m (155mg 31ml) by mouth once daily. May take on empty stomach to decrease nausea & vomiting 42 capsule 0   No current facility-administered medications on file  prior to visit.     Allergies: No Known Allergies Past Medical History: No past medical history on file. Past Surgical History:  Past Surgical History:  Procedure Laterality Date   CRANIOTOMY Left 10/13/2018   Procedure: LEFT CRANIOTOMY FOR TUMOR RESECTION;  Surgeon: Judith Part, MD;  Location: [facility];  Service: Neurosurgery;  Laterality: Left;   Social History:  Social History   Socioeconomic History   Marital status: Single    Spouse name: Not on file   Number of children: Not on file   Years of education: Not on file    Highest education level: Not on file  Occupational History   Not on file  Social Needs   Financial resource strain: Not on file   Food insecurity    Worry: Not on file    Inability: Not on file   Transportation needs    Medical: Not on file    Non-medical: Not on file  Tobacco Use   Smoking status: Never Smoker   Smokeless tobacco: Never Used  Substance and Sexual Activity   Alcohol use: Not on file   Drug use: Not on file   Sexual activity: Not on file  Lifestyle   Physical activity    Days per week: Not on file    Minutes per session: Not on file   Stress: Not on file  Relationships   Social connections    Talks on phone: Not on file    Gets together: Not on file    Attends religious service: Not on file    Active member of club or organization: Not on file    Attends meetings of clubs or organizations: Not on file    Relationship status: Not on file   Intimate partner violence    Fear of current or ex partner: Not on file    Emotionally abused: Not on file    Physically abused: Not on file    Forced sexual activity: Not on file  Other Topics Concern   Not on file  Social History Narrative   Not on file   Family History: No family history on file.  Review of Systems: Constitutional: Denies fevers, chills or abnormal weight loss Eyes: Denies blurriness of vision Ears, nose, mouth, throat, and face: Denies mucositis or sore throat Respiratory: Denies cough, dyspnea or wheezes Cardiovascular: Denies palpitation, chest discomfort or lower extremity swelling Gastrointestinal:  Denies nausea, constipation, diarrhea GU: Denies dysuria or incontinence Skin: Denies abnormal skin rashes Neurological: Per HPI Musculoskeletal: Denies joint pain, back or neck discomfort. No decrease in ROM Behavioral/Psych: Denies anxiety, disturbance in thought content, and mood instability  Physical Exam: Vitals:   12/16/18 1229  BP: 130/88  Pulse: 95  Resp: 17    Temp: 98.3 F (36.8 C)  SpO2: 100%   KPS: 60. General: Alert, cooperative, pleasant, in no acute distress Head: Craniotomy scar noted, dry and intact. EENT: No conjunctival injection or scleral icterus. Oral mucosa moist Lungs: Resp effort normal Cardiac: Regular rate and rhythm Abdomen: Soft, non-distended abdomen Skin: No rashes cyanosis or petechiae. Extremities: No clubbing or edema  Neurologic Exam: Mental Status: Awake, alert, attentive to examiner. Oriented to self and environment. Language is densely impaired with regards to fluency.  Comprehension impaired to multi-step complex commands.  Simple repetition is preserved. Cranial Nerves: Visual acuity is grossly normal. Dense R homonymous hemianopia. Extra-ocular movements intact. No ptosis. Face is symmetric, tongue midline. Motor: Tone and bulk are normal. Power is 3/5 in right  arm and leg, 5/5 on left side. Reflexes are symmetric, no pathologic reflexes present. Sensory: Intact to light touch and temperature Gait: Deferred  Labs: I have reviewed the data as listed    Component Value Date/Time   NA 137 12/16/2018 1211   K 4.4 12/16/2018 1211   CL 100 12/16/2018 1211   CO2 25 12/16/2018 1211   GLUCOSE 104 (H) 12/16/2018 1211   BUN 23 (H) 12/16/2018 1211   CREATININE 0.62 12/16/2018 1211   CALCIUM 8.8 (L) 12/16/2018 1211   PROT 6.4 (L) 12/16/2018 1211   ALBUMIN 3.4 (L) 12/16/2018 1211   AST 18 12/16/2018 1211   ALT 86 (H) 12/16/2018 1211   ALKPHOS 60 12/16/2018 1211   BILITOT 0.4 12/16/2018 1211   GFRNONAA >60 12/16/2018 1211   GFRAA >60 12/16/2018 1211   Lab Results  Component Value Date   WBC 14.2 (H) 12/16/2018   NEUTROABS 10.3 (H) 12/16/2018   HGB 14.8 12/16/2018   HCT 46.2 12/16/2018   MCV 88.0 12/16/2018   PLT 122 (L) 12/16/2018    Imaging:  Mr Jeri Cos MV Contrast  Addendum Date: 11/25/2018   ADDENDUM REPORT: 11/25/2018 17:19 ADDENDUM: Additional area of residual tumor in the left inferior basal  ganglia. This area shows restricted diffusion and hyperintense signal on T2 and FLAIR with mass-effect. This measures approximately 2 cm in diameter with mild enhancement. This mass appears to have enlarged since the prior MRI of 11/11/2018 compatible with actively growing tumor. The other area of residual tumor in the left medial temporal lobe measures 20 mm in diameter and does not show significant enhancement. This mass may have grown slightly since the prior MRI of 11/11/2018. This also shows restricted diffusion. Imaging findings were discussed by phone with Dr. [name] Electronically Signed   By: Franchot Gallo M.D.   On: 11/25/2018 17:19   Result Date: 11/25/2018 CLINICAL DATA:  Glioblastoma left temporal lobe.  Postop resection. EXAM: MRI HEAD WITHOUT AND WITH CONTRAST TECHNIQUE: Multiplanar, multiecho pulse sequences of the brain and surrounding structures were obtained without and with intravenous contrast. CONTRAST:  41m GADAVIST GADOBUTROL 1 MMOL/ML IV SOLN COMPARISON:  MRI head 11/11/2018, 10/14/2018, 10/13/2018 FINDINGS: Brain: Postop resection of large mass in the left temporal lobe. Contracting encephalomalacia in the surgical site. Hemosiderin lined surgical cavity. Masslike enlargement of the left medial temporal lobe measuring 18 mm in diameter. This does not enhance and most likely is residual tumor. Much of the original tumor did not enhance. 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 Improvement in cortical enhancement in the left inferior occipital lobe extending into the posterior temporal lobe on the left. Infarct also involves the left thalamus particularly the pulvinar. Small area of infarct in the splenium of the corpus callosum continues to show restricted diffusion. Ventricle size normal.  No midline shift. Vascular: Normal arterial flow voids Skull and upper cervical spine: Left temporal craniotomy. Sinuses/Orbits: Negative Other: None IMPRESSION:  Further contraction of the surgical resection cavity in left temporal lobe. 18 mm masslike enlargement left medial temporal lobe compatible with residual nonenhancing tumor Resolving left posterior cerebral artery infarct. Electronically Signed: By: CFranchot GalloM.D. On: 11/24/2018 13:11    Pathology: SURGICAL PATHOLOGY  CASE: [id number]  PATIENT: AHaywood Filler Surgical Pathology Report   Clinical History: ICH (cm)   DIAGNOSIS:   A. BRAIN, LEFT TEMPORAL, RESECTION:  - High grade glial neoplasm, WHO grade IV  - See comment   B. BRAIN, LEFT TEMPORAL, RESECTION:  -  High grade glial neoplasm, WHO grade IV  - See comment    COMMENT:  This case was sent to Dr. Maisie Fus at [facility] for consultation.  A copy of the his complete report is available in the patient's  electronic medical record. Below is his comment:   The tumor is a high-grade infiltrating glial neoplasm. By  immunohistochemistry, GFAP is focally positive; OLIG2 and synaptophysin  are negative; p53 is diffusely positive; IDH 1R132H is negative; and  ATRX shows loss of nuclear staining. 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 H3F3A mutation analysis is being  performed and an addendum will be issued after the test result is  available. Advanced molecular testing and/or methylation testing is  also suggested.   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  He is clinically stable now having completed first two weeks of IMRT and concurrent Temodar at 29m/m2 daily.  We are very pleased with his continued improvement through aggressive neuro-directed PT and OT.  Chemotherapy should be held for the following:  ANC less than 1,000  Platelets less than 100,000  LFT or creatinine greater than 2x ULN  If  clinical concerns/contraindications develop  Of note, pathology finding demonstrates rare H3.3 G-34 mutation. LVernelle Emerald, Won, J.K., Park, CK. et al. H3 G34-mutant high-grade glioma. Brain Tumor Pathol (2020).  Given need for greater understanding of tumor genetic makeup and possible targets for future therapy, we will recommend sending available tissue to [facility] for whole exome and RNA sequencing.   Decadron should be decreased to 421mdaily x1 week, then 29m26maily (or 4mg129mg alternating days) for one week.  Screening for potential clinical trials was performed and discussed using eligibility criteria for active protocols [facility], as well as national database available on [email or web address] The patient is not a candidate for a research protocol at this time due to poor functional status.   We spent twenty additional minutes teaching regarding the natural history, biology, and historical experience in the treatment of brain tumors. We then discussed in detail the current recommendations for therapy focusing on the mode of administration, mechanism of action, anticipated toxicities, and quality of life issues associated with this plan. We also provided teaching sheets for the patient to take home as an additional resource.  He will follow up again in 2 weeks with labs for evaluation, following week #4 of IMRT/TMZ.  All questions were answered. The patient knows to call the clinic with any problems, questions or concerns. No barriers to learning were detected.  The total time spent in the encounter was 45 minutes and more than 50% was on counseling and review of test results   ZachVentura Sellers Medical Director of Neuro-Oncology [facility] CenterWeslIla01/20 12:19 PM

## 2018-12-17 ENCOUNTER — Ambulatory Visit: Payer: 59

## 2018-12-17 ENCOUNTER — Telehealth: Payer: Self-pay | Admitting: Internal Medicine

## 2018-12-17 ENCOUNTER — Ambulatory Visit: Payer: 59 | Admitting: Occupational Therapy

## 2018-12-17 ENCOUNTER — Encounter: Payer: Self-pay | Admitting: Occupational Therapy

## 2018-12-17 ENCOUNTER — Ambulatory Visit: Payer: 59 | Attending: Gastroenterology | Admitting: Physical Therapy

## 2018-12-17 ENCOUNTER — Ambulatory Visit
Admission: RE | Admit: 2018-12-17 | Discharge: 2018-12-17 | Disposition: A | Discharge status: Home / Self Care | Payer: 59 | Source: Ambulatory Visit | Attending: Radiation Oncology | Admitting: Radiation Oncology

## 2018-12-17 ENCOUNTER — Other Ambulatory Visit: Payer: Self-pay

## 2018-12-17 ENCOUNTER — Encounter: Payer: Self-pay | Admitting: Physical Therapy

## 2018-12-17 DIAGNOSIS — R471 Dysarthria and anarthria: Secondary | ICD-10-CM | POA: Insufficient documentation

## 2018-12-17 DIAGNOSIS — R482 Apraxia: Secondary | ICD-10-CM | POA: Diagnosis present

## 2018-12-17 DIAGNOSIS — R41842 Visuospatial deficit: Secondary | ICD-10-CM | POA: Insufficient documentation

## 2018-12-17 DIAGNOSIS — R41841 Cognitive communication deficit: Secondary | ICD-10-CM

## 2018-12-17 DIAGNOSIS — R2689 Other abnormalities of gait and mobility: Secondary | ICD-10-CM | POA: Insufficient documentation

## 2018-12-17 DIAGNOSIS — G8191 Hemiplegia, unspecified affecting right dominant side: Secondary | ICD-10-CM

## 2018-12-17 DIAGNOSIS — Z510 Encounter for antineoplastic radiation therapy: Secondary | ICD-10-CM | POA: Diagnosis not present

## 2018-12-17 DIAGNOSIS — R4701 Aphasia: Secondary | ICD-10-CM | POA: Diagnosis present

## 2018-12-17 DIAGNOSIS — R29818 Other symptoms and signs involving the nervous system: Secondary | ICD-10-CM | POA: Diagnosis present

## 2018-12-17 DIAGNOSIS — M6281 Muscle weakness (generalized): Secondary | ICD-10-CM | POA: Diagnosis present

## 2018-12-17 DIAGNOSIS — R2681 Unsteadiness on feet: Secondary | ICD-10-CM | POA: Insufficient documentation

## 2018-12-17 NOTE — Therapy (Signed)
[facility] [address] [place], [place], [address] Phone: [phone]   Fax:  [phone]  Physical Therapy Evaluation  Patient Details  Name: Gregory Moreno MRN: [id number] Date of Birth: 08-01-1995 Referring Provider (PT): Arlana Hove, MD   Encounter Date: 12/17/2018  PT End of Session - 12/17/18 1725    Visit Number  1    Number of Visits  25    Authorization Type  [employer] - $40 co-pay, will include all 3 disciplines if seen on the same day.    PT Start Time  1626    PT Stop Time  1708    PT Time Calculation (min)  42 min    Equipment Utilized During Treatment  Gait belt    Activity Tolerance  Patient tolerated treatment well    Behavior During Therapy  WFL for tasks assessed/performed       History reviewed. No pertinent past medical history.  Past Surgical History:  Procedure Laterality Date  . CRANIOTOMY Left 10/13/2018   Procedure: LEFT CRANIOTOMY FOR TUMOR RESECTION;  Surgeon: Judith Part, MD;  Location: [facility];  Service: Neurosurgery;  Laterality: Left;    There were no vitals filed for this visit.   Subjective Assessment - 12/17/18 1631    Subjective  Was discharged from the hospital 11/28/18 and received inpatient rehab in the hospital. Then came home and received [facility] therapies for 2 visits. Getting radiation every week day and this is the 3rd week - it is scheduled through December 30th. He has been transferring from the w/c squat/stand pivot with family supervision/guard. Has been walking with a hemiwalker at home. No falls.    Limitations  Sitting;Walking;Standing    How long can you walk comfortably?  100' with hemiwalker    Patient Stated Goals  wants to improve his walking, get back to normal    Currently in Pain?  No/denies         [facility] PT Assessment - 12/17/18 1621      Assessment   Medical Diagnosis  glioblastoma    Referring Provider (PT)  Arlana Hove, MD    Onset  Date/Surgical Date  10/13/18    Hand Dominance  Right    Prior Therapy  CIR, HH      Precautions   Precautions  Fall      Balance Screen   Has the patient fallen in the past 6 months  No    Has the patient had a decrease in activity level because of a fear of falling?   No    Is the patient reluctant to leave their home because of a fear of falling?   No      Home Social worker  Private residence    Living Arrangements  Parent    Available Help at Discharge  Family    Type of [place] entrance    [place]  Two level    Alternate Level Stairs-Number of Steps  1   step   Home Equipment  Wheelchair - manual;Other (comment);Shower seat;Grab bars - tub/shower;Grab bars - toilet   Hemi-walker,      Prior Function   Level of Independence  Independent    Vocation  Student    Leisure  [employer], school for Public relations account executive, cross country.      Cognition   Overall Cognitive Status  Impaired/Different from baseline  Sensation   Light Touch  Appears Intact      Coordination   Gross Motor Movements are Fluid and Coordinated  No    Coordination and Movement Description  R hemi, motor planning deficits    Heel Shin Test  RLE impaired 2/2 weakness      Posture/Postural Control   Posture/Postural Control  Postural limitations    Postural Limitations  Posterior pelvic tilt      Tone   Assessment Location  Right Lower Extremity      ROM / Strength   AROM / PROM / Strength  Strength;PROM      PROM   Overall PROM Comments  will further assess at upcoming visits      Strength   Strength Assessment Site  Hip;Knee;Ankle    Right/Left Hip  Right;Left    Right Hip Flexion  1/5    Left Hip Flexion  4/5    Right/Left Knee  Right;Left    Right Knee Flexion  2-/5    Right Knee Extension  2-/5    Left Knee Flexion  4/5    Left Knee Extension  5/5    Right/Left Ankle  Right;Left    Left Ankle Dorsiflexion  5/5      Bed  Mobility   Bed Mobility  Supine to Sit;Sit to Supine;Rolling Left    Rolling Left  Moderate Assistance - Patient 50-74%   for trunk control    Supine to Sit  Moderate Assistance - Patient 50-74%    Sit to Supine  Minimal Assistance - Patient > 75%;Other (comment)   for BLEs     Transfers   Transfers  Sit to Stand;Stand to Sit;Stand Pivot Transfers    Sit to Stand  4: Min assist;3: Mod assist    Sit to Stand Details  Verbal cues for sequencing;Verbal cues for technique;Manual facilitation for weight shifting;Manual facilitation for placement;Manual facilitation for weight bearing    Sit to Stand Details (indicate cue type and reason)  From mat table with hemi-walker min A, therapist with assisting for weight shifting towards LUE, visual cues for anterior weight shift. From w/c from 2nd bout of gait, required mod A secondary to fatigue.    Stand to Sit  3: Mod assist;4: Min assist    Stand to Sit Details (indicate cue type and reason)  Tactile cues for weight shifting;Tactile cues for initiation;Tactile cues for placement;Tactile cues for weight beaing    Stand to Sit Details  Min A at mat table with hemi-walker, pt unable to reach back with LUE to control descent. Pt with poor eccentric control lowering to wheelchair from standing, mod A for descent, pt unable to reach with LUE posteriorly to sit.     Stand Pivot Transfers  4: Min assist    Stand Pivot Transfer Details (indicate cue type and reason)  from w/c to mat table, min A for balance, difficulty lifting RLE to step       Ambulation/Gait   Ambulation/Gait  Yes    Ambulation/Gait Assistance  4: Min assist    Ambulation/Gait Assistance Details  Therapist providing min A to prevent genu recurvatum during stance phase, min A for balance. Needed seated rest break halfway due to increased fatigue. Father  with w/c follow for safety.    Ambulation Distance (Feet)  46 Feet   2 x 23   Assistive device  Hemi-walker;Other (Comment)   R AFO    Gait Pattern  Step-to pattern;Decreased arm swing - right;Decreased stance  time - right;Decreased step length - left;Decreased weight shift to right;Decreased hip/knee flexion - right;Right genu recurvatum;Trunk flexed;Narrow base of support;Poor foot clearance - right    Ambulation Surface  Level;Indoor      RLE Tone   RLE Tone  Hypertonic                Objective measurements completed on examination: See above findings.              PT Education - 12/17/18 1727    Education Details  clinical findings, POC    Person(s) Educated  Patient;Parent(s)    Methods  Explanation    Comprehension  Verbalized understanding       PT Short Term Goals - 12/18/18 1826      PT SHORT TERM GOAL #1   Title  Patient and pt's family will be independent with initial HEP designed to improve movement and strength in RLE.  ALL STGS DUE 01/15/19    Time  4    Period  Weeks    Status  New    Target Date  01/15/19      PT SHORT TERM GOAL #2   Title  Patient will stand at countertop for at least 5 minutes with supervision to increase tolerance for ADLs.    Baseline  4    Period  Weeks    Status  New      PT SHORT TERM GOAL #3   Title  Pt will perform stand pivot vs. squat pivot transfer to either R or L from wc to mat table with supervision in order to decrease caregiver burden.    Time  4    Period  Weeks    Status  New      PT SHORT TERM GOAL #4   Title  Pt will perform all bed mobility with min guard/min A in order to decrease caregiver burden.    Time  4    Period  Weeks    Status  New      PT SHORT TERM GOAL #5   Title  Pt will ambulate at least 100' with hemiwalker, R AFO and min guard in order to improve household mobility.    Time  4    Period  Weeks    Status  New        PT Long Term Goals - 12/18/18 1827      PT LONG TERM GOAL #1   Title  Patient and pt's family will be independent with final HEP designed to improve movement and strength in RLE.  ALL LTGS  DUE 03/12/19    Time  12    Period  Weeks    Status  New    Target Date  03/12/19      PT LONG TERM GOAL #2   Title  Pt will ambulate at least 300' with supervision and hemiwalker vs. LRAD in order to improve community mobility.    Time  12    Period  Weeks    Status  New      PT LONG TERM GOAL #3   Title  Pt will perform at least 10 sit <> stands from mat table with proper technique with no cueing and mod I in order to improve functional transfers and demo improved LE strength.    Time  12    Period  Weeks    Status  New      PT LONG TERM GOAL #4  Title  Pt will perform all bed mobility from level mat table with mod I in order to decrease caregiver burden.    Time  12    Period  Weeks    Status  New      PT LONG TERM GOAL #5   Title  Pt will perform 4 steps with step to pattern and single handrail with supervision.    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  Patient is currently undergoing daily radiation, and he has completed an intensive inpatient rehab program, and has also received some home health services.   Patient is living at home with his parents who are providing care. The following deficits were present during the exam: difficulty following multi-step commands, right hemiplegia, apraxia, LE weakness, impaired balance, decreased postural control, decreased activity tolerance, gait abnormalities. Pt would benefit from skilled PT to address these impairments and functional limitations to decrease level of assistance needed with functional transfers, gait, and bed mobility.    Personal Factors and Comorbidities  --   pt ungergoing chemo/radiation   Examination-Activity Limitations  Bed Mobility;Locomotion Level;Sit;Squat;Stairs;Stand;Transfers     Examination-Participation Restrictions  Community Activity;School;Driving    Stability/Clinical Decision Making  Evolving/Moderate complexity    Clinical Decision Making  Moderate    Rehab Potential  Good    PT Frequency  2x / week    PT Duration  12 weeks    PT Treatment/Interventions  ADLs/Self Care Home Management;Therapeutic exercise;Therapeutic activities;Functional mobility training;Stair training;Gait training;DME Instruction;Balance training;Neuromuscular re-education;Patient/family education;Orthotic Fit/Training;Wheelchair mobility training;Energy conservation;Passive range of motion    PT Next Visit Plan  bed mobility, transfer training, gait with hemi-walker. forced use of RLE. initiate HEP    Consulted and Agree with Plan of Care  Patient;Family member/caregiver    Family Member Consulted  dad, Codie       Patient will benefit from skilled therapeutic intervention in order to improve the following deficits and impairments:  Abnormal gait, Decreased activity tolerance, Decreased balance, Decreased cognition, Decreased coordination, Decreased safety awareness, Decreased range of motion, Decreased mobility, Difficulty walking, Decreased strength, Decreased endurance, Impaired tone  Visit Diagnosis: Hemiplegia affecting right dominant side, unspecified etiology, unspecified hemiplegia type (HCC)  Muscle weakness (generalized)  Other abnormalities of gait and mobility  Other symptoms and signs involving the nervous system  Unsteadiness on feet     Problem List Patient Active Problem List   Diagnosis Date Noted  . Goals of care, counseling/discussion 12/16/2018  . Palliative care by specialist   . Glioblastoma multiforme of temporal lobe ([facility]) 11/11/2018  . Spastic hemiparesis ([facility])   . Cerebral edema (HCC)   . Intracranial tumor ([place])   . Steroid-induced hyperglycemia   . Spastic hemiplegia affecting nondominant side ([facility])   . Hyponatremia   . Transaminitis   .  Leucocytosis   . Right spastic hemiplegia ([facility]) 10/21/2018  . Dysphagia 10/21/2018  . 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 Brain mass   . ICH (intracerebral hemorrhage) ([facility]) 10/13/2018    Arliss Journey, PT, DPT  12/18/2018, 6:28 PM  [facility] [address] [place], [place], [address] Phone: [phone]  Fax:  [phone]  Name: BENTLEI MCMOORE MRN: [id number] Date of Birth: 06/24/1995

## 2018-12-17 NOTE — Progress Notes (Signed)
  Radiation Oncology         [phone] ________________________________  Name: Gregory Moreno MRN: [id number]  Date: 11/26/2018  DOB: Jun 02, 1995  SIMULATION AND TREATMENT PLANNING NOTE  DIAGNOSIS:     ICD-10-CM   1. Glioblastoma multiforme of temporal lobe (HCC)  C71.2      Site:  brain  NARRATIVE:  The patient was brought to the [facility].  Identity was confirmed.  All relevant records and images related to the planned course of therapy were reviewed.   Written consent to proceed with treatment was confirmed which was freely given after reviewing the details related to the planned course of therapy had been reviewed with the patient.  Then, the patient was set-up in a stable reproducible  supine position for radiation therapy.  CT images were obtained.  Surface markings were placed.    Medically necessary complex treatment device(s) for immobilization:  customized thermoplastic mask/ headcast.   The CT images were loaded into the planning software.  Then the target and avoidance structures were contoured.  Treatment planning then occurred.  The radiation prescription was entered and confirmed.    This is based on the patient's tomotherapy sinogram.  Each of these customized fields/ complex treatment devices will be used on a daily basis during the radiation course. I have requested : IMRT.  This is medically necessary due to the close proximity of the target region to critical normal brain structures.  I have requested a DVH of the following structures: PTV, brain, brainstem, optic chiasm, optic nerves.  The patient will undergo daily image guidance to ensure accurate localization of the target, and adequate minimize dose to the normal surrounding structures in close proximity to the target.   PLAN:  The patient will receive 46 Gy in 23 fractions initially. The patient will then receive a 14 Gy boost for a total dose of 60 Gy.   Special treatment procedure The  patient will also receive concurrent chemotherapy during the treatment. The patient may therefore experience increased toxicity or side effects and the patient will be monitored for such problems. This may require extra lab work as necessary. This therefore constitutes a special treatment procedure.   ________________________________   Jodelle Gross, MD, PhD

## 2018-12-17 NOTE — Patient Instructions (Signed)
Talk about flying/aviation with pt - even if just 2 conversational turns at this time.

## 2018-12-17 NOTE — Therapy (Signed)
[facility] [address] [place], [place], [address] Phone: [phone]   Fax:  [phone]  Occupational Therapy Evaluation  Patient Details  Name: Gregory Moreno MRN: [id number] Date of Birth: 1995/02/18 Referring Provider (OT): Marlowe Shores   Encounter Date: 12/17/2018  OT End of Session - 12/17/18 1551    Visit Number  1    Number of Visits  25    Date for OT Re-Evaluation  03/17/19    OT Start Time  [id number]    OT Stop Time  1530    OT Time Calculation (min)  43 min       History reviewed. No pertinent past medical history.  Past Surgical History:  Procedure Laterality Date  . CRANIOTOMY Left 10/13/2018   Procedure: LEFT CRANIOTOMY FOR TUMOR RESECTION;  Surgeon: Judith Part, MD;  Location: [facility];  Service: Neurosurgery;  Laterality: Left;    There were no vitals filed for this visit.  Subjective Assessment - 12/17/18 1454    Subjective   No pain - walking with less help    Currently in Pain?  No/denies    Pain Score  0-No pain        [facility] OT Assessment - 12/17/18 0001      Assessment   Medical Diagnosis  glioblastoma    Referring Provider (OT)  Linna Hoff Angiulli    Onset Date/Surgical Date  10/13/18    Hand Dominance  Right    Prior Therapy  Cir, HH      Precautions   Precautions  Fall      Balance Screen   Has the patient fallen in the past 6 months  No      Prior Function   Level of Independence  Independent with basic ADLs    Vocation  Student    Leisure  [employer] airplanes      ADL   Eating/Feeding  Minimal assistance    Grooming  Minimal assistance    Upper Body Bathing  Moderate assistance    Lower Body Bathing  Moderate assistance    Upper Body Dressing  Moderate assistance    Lower Body Dressing  Maximal assistance    Toilet Transfer  Moderate assistance    Toileting - Clothing Manipulation  Moderate assistance    Where Assessed - Camera operator Manipulationn  --     Toileting -  Hygiene  Maximal assistance    Tub/Shower Transfer  Moderate assistance    ADL comments  unable to functioanlly use RUE for ADL      Written Expression   Dominant Hand  Right      Vision - History   Patient Visual Report  Peripheral vision impairment      Vision Assessment   Eye Alignment  Impaired (comment)    Vision Assessment  Vision impaired  _ to be further tested in functional context    Ocular Range of Motion  Impaired to be futher tested in functional context    Alignment/Gaze Preference  Gaze left    Tracking/Visual Pursuits  Right eye does not track laterally    Saccades  Additional eye shifts occurred during testing    Visual Fields  Right homonymous Hemianopsia    Comment  Right inattention      Cognition   Overall Cognitive Status  Impaired/Different from baseline    Area of Impairment  Attention;Memory;Following commands;Safety/judgement;Awareness;Problem solving    Current Attention Level  Sustained    Following Commands  Follows one step commands inconsistently   language deficit, apraxia   Safety/Judgement  Decreased awareness of deficits   aphasia   Awareness  Emergent    Problem Solving  Decreased initiation;Slow processing      Posture/Postural Control   Posture/Postural Control  Postural limitations    Postural Limitations  Posterior pelvic tilt      Sensation   Light Touch  Appears Intact      Coordination   Gross Motor Movements are Fluid and Coordinated  No    Fine Motor Movements are Fluid and Coordinated  No      Perception   Perception  Impaired      Praxis   Praxis  Impaired    Praxis Impairment Details  Motor planning      Tone   Assessment Location  Right Upper Extremity      ROM / Strength   AROM / PROM / Strength  PROM      PROM   Overall PROM   Other (comment)   Shoulder sublux - PROM WFL RUE     RUE Tone   RUE Tone  Mild;Hypertonic      RUE Tone   Hypertonic Details  elbow flexion, wrist flexion, finger  flexion                      OT Education - 12/17/18 1550    Education Details  Reviewed potential OT goals and plan of care    Person(s) Educated  Patient;Parent(s)    Methods  Explanation    Comprehension  Other (comment)   Patient with limited verbal expression      OT Short Term Goals - 12/17/18 1601      OT SHORT TERM GOAL #1   Title  Patient will complete an HEP designed to improve active motion in RUE due 01/16/19    Time  4    Period  Weeks    Status  New    Target Date  01/16/19      OT SHORT TERM GOAL #2   Title  Patient will reach forward with shoulder flexion, elbow extension pattern to make contact with target in midline and 8-12' from chest with intermittent assist    Time  4    Period  Weeks    Status  New      OT SHORT TERM GOAL #3   Title  Patient will transfer from wheelchair to commode with no more than supervision assistance - transferring either toward right or toward left side    Time  4    Period  Weeks    Status  New      OT SHORT TERM GOAL #4   Title  Patient will grasp and release a cylindrical object 2-3" in diameter with min assist    Time  4    Period  Weeks    Status  New      OT SHORT TERM GOAL #5   Title  Patient will dress lower body with mod assist    Time  4    Period  Weeks    Status  New        OT Long Term Goals - 12/17/18 1606      OT LONG TERM GOAL #1   Title  Patient will dress himself with no greater than set up assistance    Time  12    Period  Weeks    Status  New  Target Date  03/17/19      OT LONG TERM GOAL #2   Title  Patient will shower with supervision    Time  12    Period  Weeks    Status  New      OT LONG TERM GOAL #3   Title  Patient will cut food on plate with modified independence    Time  12    Period  Weeks    Status  New      OT LONG TERM GOAL #4   Title  Patient will demonstrate ability to retrieve a lightweight item (less than 2lb) from waist height or lower and transport  6-12 inches laterally with RUE    Time  12    Period  Weeks    Status  New      OT LONG TERM GOAL #5   Title  Patient will demonstrate RUE  mid level reach with minimal LUE support to hit a target at chest height directly in front of body    Time  12    Period  Weeks    Status  New            Plan - 12/17/18 1552    Clinical Impression Statement  Patient is a 23 year old senior at [employer], who on 10/13/2018 was diagnosed with a brain tumor - left temporal glioblastoma.  Patient is currently undergoing daily radiation, and he has completed an intensive inpatient rehab program, and has also received some home health services.   Patient is living at home with his parents who are providing care.  Patient currently requires min-max assist with basic self care skills due to the following impairments: right hemiplegia, apraxia, decreased postural control, decreased activity tolerance, decreased cognition - attention - sustained, awareness - emergent, initiaition, slow processing, right visual field loss (hemianopsia) right sided inatention, and decreased baalnce.  Patient will benefit from skilled OT intervention to improve functional movement and use of RUE, and to decrease level of assistance needed with basic self care skills.    OT Occupational Profile and History  Comprehensive Assessment- Review of records and extensive additional review of physical, cognitive, psychosocial history related to current functional performance    Occupational performance deficits (Please refer to evaluation for details):  ADL's;IADL's;Rest and Sleep;Education;Work;Play;Leisure;Social Participation    Body Structure / Function / Physical Skills  ADL;Decreased knowledge of use of DME;Gait;Strength;Balance;Dexterity;GMC;Tone;Body mechanics;Hearing;Proprioception;UE functional use;Endurance;IADL;ROM;Vestibular;Vision;Coordination;Flexibility;Mobility;Sensation;FMC;Decreased knowledge of  precautions    Cognitive Skills  Attention;Problem Solve;Emotional;Safety Awareness;Sequencing;Memory;Learn;Thought;Understand;Perception    Psychosocial Skills  Routines and Behaviors;Interpersonal Interaction    Rehab Potential  Good    Clinical Decision Making  Multiple treatment options, significant modification of task necessary    Comorbidities Affecting Occupational Performance:  May have comorbidities impacting occupational performance   radiation therapy concurrent to rehab   Modification or Assistance to Complete Evaluation   Min-Moderate modification of tasks or assist with assess necessary to complete eval    OT Frequency  2x / week    OT Duration  12 weeks    OT Treatment/Interventions  Self-care/ADL training;DME and/or AE instruction;Splinting;Balance training;Aquatic Therapy;Therapeutic activities;Therapeutic exercise;Cognitive remediation/compensation;Neuromuscular education;Functional Mobility Training;Visual/perceptual remediation/compensation;Electrical Stimulation;Manual Therapy;Patient/family education    Plan  Transfer to mat table - dynamic sit balance, NMR RUE/Trunk - forced use simple, consider brace, start HEP/HAP    Consulted and Agree with Plan of Care  Patient;Family member/caregiver    Family Member Consulted  dad- [place]  Patient will benefit from skilled therapeutic intervention in order to improve the following deficits and impairments:   Body Structure / Function / Physical Skills: ADL, Decreased knowledge of use of DME, Gait, Strength, Balance, Dexterity, GMC, Tone, Body mechanics, Hearing, Proprioception, UE functional use, Endurance, IADL, ROM, Vestibular, Vision, Coordination, Flexibility, Mobility, Sensation, FMC, Decreased knowledge of precautions Cognitive Skills: Attention, Problem Solve, Emotional, Safety Awareness, Sequencing, Memory, Learn, Thought, Understand, Perception Psychosocial Skills: Routines and Behaviors, Interpersonal  Interaction   Visit Diagnosis: 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 Goals of care, counseling/discussion 12/16/2018  . Palliative care by specialist   . Glioblastoma multiforme of temporal lobe ([facility]) 11/11/2018  . Spastic hemiparesis ([facility])   . Cerebral edema ([facility])   . Intracranial tumor ([facility])   . Steroid-induced hyperglycemia   . Spastic hemiplegia affecting nondominant side ([facility])   . Hyponatremia   . Transaminitis   . Leucocytosis   . Right spastic hemiplegia ([facility]) 10/21/2018  . Dysphagia 10/21/2018  . 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 Brain mass   . ICH (intracerebral hemorrhage) ([facility]) 10/13/2018    Mariah Milling, OTR/L 12/17/2018, 4:17 PM  [facility] [address] [place], [place], [address] Phone: [phone]   Fax:  [phone]  Name: Gregory Moreno MRN: [id number] Date of Birth: 1995-10-13

## 2018-12-17 NOTE — Telephone Encounter (Signed)
No los per 12/1. °

## 2018-12-18 ENCOUNTER — Other Ambulatory Visit: Payer: Self-pay

## 2018-12-18 ENCOUNTER — Telehealth: Payer: Self-pay | Admitting: Internal Medicine

## 2018-12-18 ENCOUNTER — Ambulatory Visit
Admission: RE | Admit: 2018-12-18 | Discharge: 2018-12-18 | Disposition: A | Discharge status: Home / Self Care | Payer: 59 | Source: Ambulatory Visit | Attending: Radiation Oncology | Admitting: Radiation Oncology

## 2018-12-18 DIAGNOSIS — Z510 Encounter for antineoplastic radiation therapy: Secondary | ICD-10-CM | POA: Diagnosis not present

## 2018-12-18 NOTE — Therapy (Signed)
[facility] [address] [place], [place], [address] Phone: [phone]   Fax:  [phone]  Speech Language Pathology Evaluation  Patient Details  Name: Gregory Moreno MRN: [id number] Date of Birth: [birth year] Referring Provider (SLP): Alger Simons, MD   Encounter Date: 12/17/2018  End of Session - 12/17/18 1730    Visit Number  1    Number of Visits  25    Date for SLP Re-Evaluation  03/17/19   90 days   SLP Start Time  [id number]    SLP Stop Time   [id number]    SLP Time Calculation (min)  46 min    Activity Tolerance  Patient tolerated treatment well       History reviewed. No pertinent past medical history.  Past Surgical History:  Procedure Laterality Date  . CRANIOTOMY Left 10/13/2018   Procedure: LEFT CRANIOTOMY FOR TUMOR RESECTION;  Surgeon: Judith Part, MD;  Location: [facility];  Service: Neurosurgery;  Laterality: Left;    There were no vitals filed for this visit.  Subjective Assessment - 12/17/18 1542    Subjective  Pt with barely audible voice volume.    Patient is accompained by:  Family member   father   Currently in Pain?  No/denies         SLP Evaluation [facility] - 12/18/18 0001      SLP Visit Information   SLP Received On  12/17/18    Referring Provider (SLP)  Alger Simons, MD    Onset Date  10-13-18    Medical Diagnosis  glioblastoma      Subjective   Patient/Family Stated Goal  "Not sure." Father would like to see pt verbal expression improve.      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 On 10-14-18 MRI ID'd lt temporal hematoma with mass favoring a glioma. Left crani for evacuation and tumor resection on 10-13-18. 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      Prior Functional Status   Cognitive/Linguistic Baseline  Within functional limits    Type of Home  House     Lives With   [facility]    Education  Sr. in Public relations account executive    Vocation  Student      Cognition   Overall Cognitive Status  Impaired/Different from baseline    Area of Impairment  Attention;Awareness    Attention Comments  Pt demonstrated decr'd attention skills today by occasionally looking out window while SLP talking to pt and father. Pt did not initiate any conversation today. He req'd repeats of instructions - unsure at this time if more attention-based, or auditory copmrehension-based deficit, or the degree of both.    Awareness Comments  Difficult to know level of pt's awareness of surroundings due to decr'd attention, decr'd initiation, and decr'd verbal expression.     Executive Function  Initiating    Behaviors  --   extremely flat affect     Auditory Comprehension   Overall Auditory Comprehension  Impaired    Yes/No Questions  Impaired    Complex Questions  75-100% accurate    Commands  Impaired    Complex Commands  50-74% accurate    Other Conversation Comments  repeat of some comments/questions necessary - ? degree of attention as basis for deficit    Interfering Components  Attention;Visual impairments    EffectiveTechniques  Extra  processing time;Visual/Gestural cues;Repetition;Slowed speech   written cues     Expression   Primary Mode of Expression  Verbal      Verbal Expression   Overall Verbal Expression  Impaired    Initiation  Impaired    Repetition  Impaired    Level of Impairment  Phrase level   jargon/gibberish with uncommon phrases >3 words   Naming  --   further testing needed   Pragmatics  Impairment    Impairments  Abnormal affect;Monotone    Interfering Components  Attention;Speech intelligibility    Other Verbal Expression Comments  pt responded to some       Oral Motor/Sensory Function   Overall Oral Motor/Sensory Function  Other (comment)   not assessed due to clinic masking policy     Motor Speech   Overall Motor Speech  Impaired    Respiration   Impaired    Level of Impairment  Phrase    Phonation  Low vocal intensity    Resonance  Within functional limits    Intelligibility  Intelligibility reduced    Word  50-74% accurate    Phrase  50-74% accurate    Sentence  50-74% accurate    Conversation  25-49% accurate   incr'd volume <50% of the time SLP asked for repeat   Motor Planning  Not tested    Phonation  Impaired    Vocal Abuses  --   average conversation (1-2 word responses x5) < 60dB   Volume  Soft   loud /a/ max x 4 demo and max verbal cues low 60s dB     Standardized Assessments   Standardized Assessments   Western Aphasia Battery revised    Western Aphasia Battery revised   initiated - to be completed next 1-2 sessions                      SLP Education - 12/17/18 1730    Education Details  aviation/flying topic with pt    Person(s) Educated  Patient;Parent(s)    Methods  Explanation       SLP Short Term Goals - 12/18/18 1534      SLP SHORT TERM GOAL #1   Title  pt will complete aphasia assessment of auditory comprehension and verbal expression within 1-2 sessions    Time  2    Period  --   sessions   Status  New      SLP SHORT TERM GOAL #2   Title  pt will demo understanding of more complex commands (spoken) with 80% and occasional min cues over three sessions    Time  4    Period  Weeks    Status  New      SLP SHORT TERM GOAL #3   Title  pt will answer pertinent questions with at least 3 words    Time  4    Period  Weeks    Status  New      SLP SHORT TERM GOAL #4   Title  pt will demo sustained/selective attention necessary to complete efficacious speech therapy in 6 of the first 8 sessions    Time  4    Period  Weeks   or 8 sessions   Status  New       SLP Long Term Goals - 12/18/18 1540      SLP LONG TERM GOAL #1   Title  pt will demo understanding of more complex commands (spoken) with  85% and rare min cues over three sessions    Time  8    Period  Weeks   or 17  sessions, for all LTGs   Status  New      SLP LONG TERM GOAL #2   Title  pt will answer questions with WNL verbal responses 90% of the time using compensations for anomia PRN, in 3 sessions    Time  8    Period  Weeks    Status  New      SLP LONG TERM GOAL #3   Title  pt will sustain loud /a/ with average mid 70s dB over 4 sessions    Time  8    Period  Weeks    Status  New      SLP LONG TERM GOAL #4   Title  pt will engage in simple conversation for 5 minutes with average loudness mid 60s dB over 3 sessions    Time  8    Period  Weeks    Status  New       Plan - 12/17/18 1731    Clinical Impression Statement  Pt presents with multiple areas affecting cognitive communication skills at this time. Pt currently undergoing ~6 weeks of radiation to brain - 3 weeks in at this point. Pt with deficits in language reception and expression - Western Aphasia Battery begun today and will be completed in next 1-2 sessions and more goals added as necessary. Pt also demonstrates cognitive communication deficits including at least awareness and attention. Lastly, pt with dysarthria c/b reduced breath support and speech volume. Speech volume was largely unaffected today by SLP demo of loud /a/ and with SLP request for pt to repeat his message. Father asking SLP about prognosis for rehab of higher math, and science skills - SLP told father that pt has age as positive prognosticative indicator and that all people heal in different ways and different timeframes. Pt would benefit from skilled ST focusing on primarily receptive and expressive language but also at some point in rehab process focus on speech loudness and cognitive communication skills. Depending on pt progress this may occur during pt's next 24 ST visits.    Speech Therapy Frequency  2x / week    Duration  --   12 weeks or 25 total visits   Treatment/Interventions  Environmental controls;Functional tasks;Compensatory techniques;Multimodal  communcation approach;SLP instruction and feedback;Cueing hierarchy;Language facilitation;Cognitive reorganization;Internal/external aids;Patient/family education    Potential to Achieve Goals  Good    Potential Considerations  Co-morbidities;Ability to learn/carryover information;Severity of impairments;Other (comment)   unsure of pt's medical prognosis post-rad/chemo   Consulted and Agree with Plan of Care  Patient;Family member/caregiver    Family Member Consulted  father       Patient will benefit from skilled therapeutic intervention in order to improve the following deficits and impairments:   Aphasia  Dysarthria and anarthria  Cognitive communication deficit    Problem List Patient Active Problem List   Diagnosis Date Noted  . Goals of care, counseling/discussion 12/16/2018  . Palliative care by specialist   . Glioblastoma multiforme of temporal lobe ([facility]) 11/11/2018  . Spastic hemiparesis ([facility])   . Cerebral edema ([facility])   . Intracranial tumor ([facility])   . Steroid-induced hyperglycemia   . Spastic hemiplegia affecting nondominant side ([facility])   . Hyponatremia   . Transaminitis   . Leucocytosis   . Right spastic hemiplegia ([facility]) 10/21/2018  . Dysphagia 10/21/2018  . 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 Brain mass   . ICH (intracerebral hemorrhage) ([facility]) 10/13/2018    [facility] ,[place], [place]  12/18/2018, 3:44 PM  [facility] [address] [place], [place], [address] Phone: [phone]   Fax:  [phone]  Name: Gregory Moreno MRN: [id number] Date of Birth: [birth year]

## 2018-12-18 NOTE — Telephone Encounter (Signed)
Patient called back and will leave schedule as is

## 2018-12-18 NOTE — Telephone Encounter (Signed)
Per 12/1 schedule message moved 12/15 and 12/29 appointments from after xrt to before xrt due to patient had to check in twice and ended up waiting 40 minutes.   Only time available on dates above is 9 am which will still cause some wait time and patient will still need to check in for both medonc/radonc. Left message re above asking for a call back if patient would like to move patient's to 9 am or leave as scheduled for 11 am.

## 2018-12-19 ENCOUNTER — Ambulatory Visit
Admission: RE | Admit: 2018-12-19 | Discharge: 2018-12-19 | Disposition: A | Discharge status: Home / Self Care | Payer: 59 | Source: Ambulatory Visit | Attending: Radiation Oncology | Admitting: Radiation Oncology

## 2018-12-19 ENCOUNTER — Other Ambulatory Visit: Payer: Self-pay

## 2018-12-19 DIAGNOSIS — Z510 Encounter for antineoplastic radiation therapy: Secondary | ICD-10-CM | POA: Diagnosis not present

## 2018-12-22 ENCOUNTER — Other Ambulatory Visit: Payer: Self-pay | Admitting: Internal Medicine

## 2018-12-22 ENCOUNTER — Ambulatory Visit
Admission: RE | Admit: 2018-12-22 | Discharge: 2018-12-22 | Disposition: A | Discharge status: Home / Self Care | Payer: 59 | Source: Ambulatory Visit | Attending: Radiation Oncology | Admitting: Radiation Oncology

## 2018-12-22 ENCOUNTER — Other Ambulatory Visit: Payer: Self-pay

## 2018-12-22 DIAGNOSIS — Z510 Encounter for antineoplastic radiation therapy: Secondary | ICD-10-CM | POA: Diagnosis not present

## 2018-12-22 DIAGNOSIS — C712 Malignant neoplasm of temporal lobe: Secondary | ICD-10-CM

## 2018-12-23 ENCOUNTER — Other Ambulatory Visit: Payer: Self-pay

## 2018-12-23 ENCOUNTER — Ambulatory Visit: Payer: 59 | Admitting: Occupational Therapy

## 2018-12-23 ENCOUNTER — Telehealth: Payer: Self-pay | Admitting: *Deleted

## 2018-12-23 ENCOUNTER — Ambulatory Visit: Payer: 59

## 2018-12-23 ENCOUNTER — Ambulatory Visit
Admission: RE | Admit: 2018-12-23 | Discharge: 2018-12-23 | Disposition: A | Discharge status: Home / Self Care | Payer: 59 | Source: Ambulatory Visit | Attending: Radiation Oncology | Admitting: Radiation Oncology

## 2018-12-23 ENCOUNTER — Ambulatory Visit: Payer: 59 | Admitting: Physical Therapy

## 2018-12-23 ENCOUNTER — Encounter: Payer: Self-pay | Admitting: Physical Therapy

## 2018-12-23 DIAGNOSIS — G8191 Hemiplegia, unspecified affecting right dominant side: Secondary | ICD-10-CM

## 2018-12-23 DIAGNOSIS — R2689 Other abnormalities of gait and mobility: Secondary | ICD-10-CM

## 2018-12-23 DIAGNOSIS — R471 Dysarthria and anarthria: Secondary | ICD-10-CM

## 2018-12-23 DIAGNOSIS — R2681 Unsteadiness on feet: Secondary | ICD-10-CM

## 2018-12-23 DIAGNOSIS — R41841 Cognitive communication deficit: Secondary | ICD-10-CM

## 2018-12-23 DIAGNOSIS — M6281 Muscle weakness (generalized): Secondary | ICD-10-CM

## 2018-12-23 DIAGNOSIS — R4701 Aphasia: Secondary | ICD-10-CM

## 2018-12-23 DIAGNOSIS — R482 Apraxia: Secondary | ICD-10-CM

## 2018-12-23 DIAGNOSIS — Z510 Encounter for antineoplastic radiation therapy: Secondary | ICD-10-CM | POA: Diagnosis not present

## 2018-12-23 DIAGNOSIS — R41842 Visuospatial deficit: Secondary | ICD-10-CM

## 2018-12-23 NOTE — Therapy (Signed)
[facility] [address] [place], [place], [address] Phone: [phone]   Fax:  [phone]  Occupational Therapy Treatment  Patient Details  Name: Gregory Moreno MRN: [id number] Date of Birth: 06/22/1995 Referring Provider (OT): Marlowe Shores   Encounter Date: 12/23/2018  OT End of Session - 12/23/18 1629    Visit Number  2    Number of Visits  25    Date for OT Re-Evaluation  03/17/19    OT Start Time  [id number]    OT Stop Time  1616    OT Time Calculation (min)  44 min    Activity Tolerance  Patient tolerated treatment well    Behavior During Therapy  [facility] for tasks assessed/performed       No past medical history on file.  Past Surgical History:  Procedure Laterality Date  . CRANIOTOMY Left 10/13/2018   Procedure: LEFT CRANIOTOMY FOR TUMOR RESECTION;  Surgeon: Judith Part, MD;  Location: [facility];  Service: Neurosurgery;  Laterality: Left;    There were no vitals filed for this visit.                OT Treatments/Exercises (OP) - 12/23/18 0001      Neurological Re-education Exercises   Other Exercises 1  Neuromuscular reeducation to address postural control and weight shifting toward right side iin sitting and standing.  Worked on transitional movements sit to/from stand, scooting, squat pivot transfers (head/hip relationship) .  Worked on active weight bearing through long arm in sitting.  Patient needing cues and facilitation to visually attend to right arm.  Patient with best result with forced use of RUE conditions - tillted surface, forearm gym, flipping cards,e tc with RUE.  Patient with improved ability to flex at shoulder for low reach pattern immediately following weight bearing.                 OT Short Term Goals - 12/17/18 1601      OT SHORT TERM GOAL #1   Title  Patient will complete an HEP designed to improve active motion in RUE due 01/16/19    Time  4    Period  Weeks     Status  New    Target Date  01/16/19      OT SHORT TERM GOAL #2   Title  Patient will reach forward with shoulder flexion, elbow extension pattern to make contact with target in midline and 8-12' from chest with intermittent assist    Time  4    Period  Weeks    Status  New      OT SHORT TERM GOAL #3   Title  Patient will transfer from wheelchair to commode with no more than supervision assistance - transferring either toward right or toward left side    Time  4    Period  Weeks    Status  New      OT SHORT TERM GOAL #4   Title  Patient will grasp and release a cylindrical object 2-3" in diameter with min assist    Time  4    Period  Weeks    Status  New      OT SHORT TERM GOAL #5   Title  Patient will dress lower body with mod assist    Time  4    Period  Weeks    Status  New        OT Long Term Goals -  12/17/18 1606      OT LONG TERM GOAL #1   Title  Patient will dress himself with no greater than set up assistance    Time  12    Period  Weeks    Status  New    Target Date  03/17/19      OT LONG TERM GOAL #2   Title  Patient will shower with supervision    Time  12    Period  Weeks    Status  New      OT LONG TERM GOAL #3   Title  Patient will cut food on plate with modified independence    Time  12    Period  Weeks    Status  New      OT LONG TERM GOAL #4   Title  Patient will demonstrate ability to retrieve a lightweight item (less than 2lb) from waist height or lower and transport 6-12 inches laterally with RUE    Time  12    Period  Weeks    Status  New      OT LONG TERM GOAL #5   Title  Patient will demonstrate RUE  mid level reach with minimal LUE support to hit a target at chest height directly in front of body    Time  12    Period  Weeks    Status  New            Plan - 12/23/18 1629    Clinical Impression Statement  Patient is tolerating three disciplines in a day following radiation treatment    OT Frequency  2x / week    OT  Duration  12 weeks    OT Treatment/Interventions  Self-care/ADL training;DME and/or AE instruction;Splinting;Balance training;Aquatic Therapy;Therapeutic activities;Therapeutic exercise;Cognitive remediation/compensation;Neuromuscular education;Functional Mobility Training;Visual/perceptual remediation/compensation;Electrical Stimulation;Manual Therapy;Patient/family education    Plan  Transfer to mat table - dynamic sit balance, NMR RUE/Trunk - forced use simple, consider brace, start HEP/HAP, check splint    Consulted and Agree with Plan of Care  Patient;Family member/caregiver    Family Member Consulted  dad- Terelle       Patient will benefit from skilled therapeutic intervention in order to improve the following deficits and impairments:           Visit Diagnosis: 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 Goals of care, counseling/discussion 12/16/2018  . Palliative care by specialist   . 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 Spastic hemiparesis ([place])   . Cerebral edema (HCC)   . Intracranial tumor ([place])   . Steroid-induced hyperglycemia   . Spastic hemiplegia affecting nondominant side ([place])   . Hyponatremia   . Transaminitis   . Leucocytosis   . Right spastic hemiplegia ([place]) 10/21/2018  . Dysphagia 10/21/2018  . 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 Brain mass   . ICH (intracerebral hemorrhage) ([place]) 10/13/2018    Mariah Milling, OTR/L 12/23/2018, 4:31 PM  [facility] [address] [place], [place], [address] Phone: [phone]   Fax:  [phone]  Name: LOUISE MCCULLAGH MRN: [id number] Date of Birth: [birth year]

## 2018-12-23 NOTE — Therapy (Signed)
[facility] [address] [place], [place], [address] Phone: [phone]   Fax:  [phone]  Speech Language Pathology Treatment  Patient Details  Name: Gregory Moreno MRN: [id number] Date of Birth: [birth year] Referring Provider (SLP): Gregory Simons, MD   Encounter Date: 12/23/2018  End of Session - 12/23/18 1712    Visit Number  2    Number of Visits  25    Date for SLP Re-Evaluation  03/17/19    SLP Start Time  1448    SLP Stop Time   1535    SLP Time Calculation (min)  47 min    Activity Tolerance  Patient tolerated treatment well       No past medical history on file.  Past Surgical History:  Procedure Laterality Date  . CRANIOTOMY Left 10/13/2018   Procedure: LEFT CRANIOTOMY FOR TUMOR RESECTION;  Surgeon: Gregory Part, MD;  Location: [facility];  Service: Neurosurgery;  Laterality: Left;    There were no vitals filed for this visit.  Subjective Assessment - 12/23/18 1656    Subjective  "Pretty good."    Patient is accompained by:  Family member   dad   Currently in Pain?  No/denies            ADULT SLP TREATMENT - 12/23/18 1657      General Information   Behavior/Cognition  Cooperative;Lethargic;Requires cueing;Decreased sustained attention   very flat affect     Treatment Provided   Treatment provided  Cognitive-Linquistic      Cognitive-Linquistic Treatment   Treatment focused on  Aphasia    Skilled Treatment  SLP focused on completing Western Aphasia Battery- Revised. SLP completed Spontaneous speech, Auditory Verbal Comprehension, and partly through Naming and Word Finding subtests. SLP explained to father at the end of session after pt left tha pt's aphasia is significant but that pt has young age as a positive prognostic indicator. SLP also shared it may be necessary to work on volume and aphasia simultaneously, and encouraged family to ask AJ to "shout" at home to begin to  habitualize louder speech.       Assessment / Recommendations / Plan   Plan  Continue with current plan of care      Progression Toward Goals   Progression toward goals  Progressing toward goals       SLP Education - 12/23/18 1712    Education Details  positive prognostic indicator (age), need to encourage pt to shout at home    Person(s) Educated  Parent(s)    Methods  Explanation    Comprehension  Verbalized understanding       SLP Short Term Goals - 12/23/18 1715      SLP SHORT TERM GOAL #1   Title  pt will complete aphasia assessment of auditory comprehension and verbal expression within 1-2 sessions    Time  1    Period  --   sessions   Status  On-going      SLP SHORT TERM GOAL #2   Title  pt will demo understanding of more complex commands (spoken) with 80% and occasional min cues over three sessions    Time  4    Period  Weeks    Status  On-going      SLP SHORT TERM GOAL #3   Title  pt will answer pertinent questions with at least 3 words    Time  4    Period  Weeks  Status  On-going      SLP SHORT TERM GOAL #4   Title  pt will demo sustained/selective attention necessary to complete efficacious speech therapy in 6 of the first 8 sessions    Time  4    Period  Weeks   or 8 sessions   Status  On-going       SLP Long Term Goals - 12/23/18 1715      SLP LONG TERM GOAL #1   Title  pt will demo understanding of more complex commands (spoken) with 85% and rare min cues over three sessions    Time  8    Period  Weeks   or 17 sessions, for all LTGs   Status  On-going      SLP LONG TERM GOAL #2   Title  pt will answer questions with WNL verbal responses 90% of the time using compensations for anomia PRN, in 3 sessions    Time  8    Period  Weeks    Status  On-going      SLP LONG TERM GOAL #3   Title  pt will sustain loud /a/ with average mid 70s dB over 4 sessions    Time  8    Period  Weeks    Status  On-going      SLP LONG TERM GOAL #4   Title   pt will engage in simple conversation for 5 minutes with average loudness mid 60s dB over 3 sessions    Time  8    Period  Weeks    Status  On-going       Plan - 12/23/18 1713    Clinical Impression Statement  Pt cont to present with aphasia, dysarthria/voice, and cognitive communication deficits. WAB-R should be completed next session. Pt would benefit from skilled ST focusing on primarily receptive and expressive language but also at some point in rehab process focus on speech loudness and cognitive communication skills. Depending on pt progress this may occur during pt's next 24 ST visits.    Speech Therapy Frequency  2x / week    Duration  --   12 weeks or 25 total visits   Treatment/Interventions  Environmental controls;Functional tasks;Compensatory techniques;Multimodal communcation approach;SLP instruction and feedback;Cueing hierarchy;Language facilitation;Cognitive reorganization;Internal/external aids;Patient/family education    Potential to Achieve Goals  Good    Potential Considerations  Co-morbidities;Ability to learn/carryover information;Severity of impairments;Other (comment)   unsure of pt's medical prognosis post-rad/chemo   Consulted and Agree with Plan of Care  Patient;Family member/caregiver    Family Member Consulted  father       Patient will benefit from skilled therapeutic intervention in order to improve the following deficits and impairments:   Aphasia  Dysarthria and anarthria  Cognitive communication deficit    Problem List Patient Active Problem List   Diagnosis Date Noted  . Goals of care, counseling/discussion 12/16/2018  . Palliative care by specialist   . 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 Spastic hemiparesis ([place])   . Cerebral edema (HCC)   . Intracranial tumor ([place])   . Steroid-induced hyperglycemia   . Spastic hemiplegia affecting nondominant side ([place])   . Hyponatremia   . Transaminitis   . Leucocytosis   .  Right spastic hemiplegia ([place]) 10/21/2018  . Dysphagia 10/21/2018  . 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 Brain mass   . ICH (intracerebral hemorrhage) ([place]) 10/13/2018    [place] ,[place], [place]  12/23/2018, 5:16 PM  [facility] [address] [place], [place], [address] Phone: [phone]   Fax:  [phone]   Name: Gregory Moreno MRN: [id number] Date of Birth: [birth year]

## 2018-12-23 NOTE — Telephone Encounter (Signed)
Patients father called concerned about not having enough of his Temodar on hand to complete the concurrent oral chemo temodar and radiation.  Appears that partial shipment occurred from [facility].   Called [facility] [phone] spoke with Crystal and confirmed that on 12/01/2018 a quantity of 42 tablets of the 100 mg and 30 tablets of the 20 mg were sent via mail.  Advised that dose was partially shipped and we need the remaining 12 tablets of Temodar 20 mg shipped to patients family.  Processing remaining dose of original order.   Patients mother is aware of expected shipment.

## 2018-12-24 ENCOUNTER — Other Ambulatory Visit: Payer: Self-pay

## 2018-12-24 ENCOUNTER — Ambulatory Visit
Admission: RE | Admit: 2018-12-24 | Discharge: 2018-12-24 | Disposition: A | Discharge status: Home / Self Care | Payer: 59 | Source: Ambulatory Visit | Attending: Radiation Oncology | Admitting: Radiation Oncology

## 2018-12-24 DIAGNOSIS — Z510 Encounter for antineoplastic radiation therapy: Secondary | ICD-10-CM | POA: Diagnosis not present

## 2018-12-24 NOTE — Therapy (Signed)
[facility] [address] [place], [place], [address] Phone: [phone]   Fax:  [phone]  Physical Therapy Treatment  Patient Details  Name: Gregory Moreno MRN: [id number] Date of Birth: Mar 18, 1995 Referring Provider (PT): Alger Simons, MD   Encounter Date: 12/23/2018     12/25/18 1725  PT Visits / Re-Eval  Visit Number 2  Number of Visits 25  [place] - $40 co-pay, will include all 3 disciplines if seen on the same day.  PT Time Calculation  PT Start Time 1617  PT Stop Time 1700  PT Time Calculation (min) 43 min  PT - End of Session  Equipment Utilized During Treatment Gait belt  Activity Tolerance Patient tolerated treatment well  Behavior During Therapy WFL for tasks assessed/performed    History reviewed. No pertinent past medical history.  Past Surgical History:  Procedure Laterality Date  . CRANIOTOMY Left 10/13/2018   Procedure: LEFT CRANIOTOMY FOR TUMOR RESECTION;  Surgeon: Judith Part, MD;  Location: [facility];  Service: Neurosurgery;  Laterality: Left;    There were no vitals filed for this visit.  Subjective Assessment - 12/23/18 1619    Subjective  Hasn't been doing as much walking at home. No falls. Notices getting in and out of the bed has been getting easier at home.    Limitations  Sitting;Walking;Standing    How long can you walk comfortably?  100' with hemiwalker    Patient Stated Goals  wants to improve his walking, get back to normal    Currently in Pain?  No/denies                 12/25/18 0001  Bed Mobility  Bed Mobility Supine to Sit;Sit to Supine;Rolling Left  Rolling Right Minimal Assistance - Patient > 75% (needs tactile cues to initiate)  Supine to Sit Minimal Assistance - Patient > 75%  Sit to Supine Minimal Assistance - Patient > 75% (for BLEs)  Transfers  Transfers Sit to Stand;Stand to Sit;Stand Pivot Transfers  Sit to  Stand 4: Min assist  Sit to Stand Details Verbal cues for sequencing;Verbal cues for technique;Manual facilitation for weight shifting;Manual facilitation for placement;Manual facilitation for weight bearing  Sit to Stand Details (indicate cue type and reason) Noted pt with increased R hip IR and ADD when performing while standing, performed reps at edge of mat with ball between thighs to prevent ADD. Performed from higher blue mat table    Stand to Sit 3: Mod assist;4: Min assist  Stand to Sit Details (indicate cue type and reason) Tactile cues for weight shifting;Tactile cues for initiation;Tactile cues for placement;Tactile cues for weight beaing  Stand to Sit Details Continues to need min/mod A for eccentric control back to w/c, with simple verbal and demonstrative cues pt able to reach back for armrest on w/c with LUE 1 out of 2 times today. pt does not reach back towards mat table when performing at edge of mat.  Squat Pivot Transfers 4: Min assist;4: Min Production designer, theatre/television/film Details (indicate cue type and reason) needed initial demonstrative cue - as pt went to initially go to perform stand pivot transfer. initial verbal and demonstrative cue also for head/hips relationship, to mat table - min A at pelvis - from mat table to w/c at end of session, min guard - pt able to initiate scooting to get closer to w/c before transferring  Ambulation/Gait  Ambulation/Gait Yes  Ambulation/Gait Assistance 4:  Min assist  Ambulation/Gait Assistance Details Min A for balance and to prevent genu recurvatum with RLE. Pt with increased forward flexed posture and difficulty with swing phase on RLE when fatigued.  Ambulation Distance (Feet) 45 Feet (x1, 30 x 1)  Assistive device Hemi-walker;Other (Comment) (R AFO)  Gait Pattern Step-to pattern;Decreased arm swing - right;Decreased stance time - right;Decreased step length - left;Decreased weight shift to right;Decreased hip/knee flexion - right;Right genu  recurvatum;Trunk flexed;Narrow base of support;Poor foot clearance - right  Ambulation Surface Level;Indoor  Pre-Gait Activities Standing at edge of mat table with LUE supporiting on chair anteriorly, therapist providing min/mod A for balance and to prevent knee buckling on R, multiple reps in standing, cues to stand tall through RLE for quad activation. use of mirror for multiple reps for visual cueing for midline and to shift weight towards R.  Therapeutic Activites   Therapeutic Activities Other Therapeutic Activities  Other Therapeutic Activities Bed mobility: multiple reps of bridging and scooting hips to right and left on mat table with manual facilitation and tactile cues from therapist, pt able to help shift trunk using LUE with min A from therapist. Needs mod A from therapist for RLE in bridging for positioning.   Exercises  Exercises Other Exercises  Other Exercises  Noted increased pelvic retraction during terminal stance in gait,possibly due to hip flexor weakness, performed hip flexor stretch at edge of mat 3 x 30 seconds.                      PT Short Term Goals - 12/18/18 1826      PT SHORT TERM GOAL #1   Title  Patient and pt's family will be independent with initial HEP designed to improve movement and strength in RLE.  ALL STGS DUE 01/15/19    Time  4    Period  Weeks    Status  New    Target Date  01/15/19      PT SHORT TERM GOAL #2   Title  Patient will stand at countertop for at least 5 minutes with supervision to increase tolerance for ADLs.    Baseline  4    Period  Weeks    Status  New      PT SHORT TERM GOAL #3   Title  Pt will perform stand pivot vs. squat pivot transfer to either R or L from wc to mat table with supervision in order to decrease caregiver burden.    Time  4    Period  Weeks    Status  New      PT SHORT TERM GOAL #4   Title  Pt will perform all bed mobility with min guard/min A in order to decrease caregiver burden.    Time   4    Period  Weeks    Status  New      PT SHORT TERM GOAL #5   Title  Pt will ambulate at least 100' with hemiwalker, R AFO and min guard in order to improve household mobility.    Time  4    Period  Weeks    Status  New        PT Long Term Goals - 12/18/18 1827      PT LONG TERM GOAL #1   Title  Patient and pt's family will be independent with final HEP designed to improve movement and strength in RLE.  ALL LTGS DUE 03/12/19    Time  12    Period  Weeks    Status  New    Target Date  03/12/19      PT LONG TERM GOAL #2   Title  Pt will ambulate at least 300' with supervision and hemiwalker vs. LRAD in order to improve community mobility.    Time  12    Period  Weeks    Status  New      PT LONG TERM GOAL #3   Title  Pt will perform at least 10 sit <> stands from mat table with proper technique with no cueing and mod I in order to improve functional transfers and demo improved LE strength.    Time  12    Period  Weeks    Status  New      PT LONG TERM GOAL #4   Title  Pt will perform all bed mobility from level mat table with mod I in order to decrease caregiver burden.    Time  12    Period  Weeks    Status  New      PT LONG TERM GOAL #5   Title  Pt will perform 4 steps with step to pattern and single handrail with supervision.    Time  12    Period  Weeks    Status  New          12/25/18 1723  Plan  Clinical Impression Statement Skilled session today focused on gait training, transfer training, bed mobility, and LE strengthening. Patient tolerated session well today. Patient responds well to visual cues - pt better able to shift to midline and towards RLE in standing with use of mirror and tactile cues from therapist to shift towards R. Pt able to ambulate farther distance today than initial evaluation - still performs with step to pattern. Pt will continue to benefit from NMR, strengthening and weight shifting to RLE to improve gait and functional transfers.   Personal Factors and Comorbidities  (pt ungergoing chemo/radiation)  Examination-Activity Limitations Bed Mobility;Locomotion Level;Sit;Squat;Stairs;Stand;Transfers  Examination-Participation Restrictions Community Activity;School;Driving  Pt will benefit from skilled therapeutic intervention in order to improve on the following deficits Abnormal gait;Decreased activity tolerance;Decreased balance;Decreased cognition;Decreased coordination;Decreased safety awareness;Decreased range of motion;Decreased mobility;Difficulty walking;Decreased strength;Decreased endurance;Impaired tone  Stability/Clinical Decision Making Evolving/Moderate complexity  Rehab Potential Good  PT Frequency 2x / week  PT Duration 12 weeks  PT Treatment/Interventions ADLs/Self Care Home Management;Therapeutic exercise;Therapeutic activities;Functional mobility training;Stair training;Gait training;DME Instruction;Balance training;Neuromuscular re-education;Patient/family education;Orthotic Fit/Training;Wheelchair mobility training;Energy conservation;Passive range of motion  PT Next Visit Plan continue gait with hemi walker. NMR for weight shifting and stance on RLE. sit <> stands from higher mat table (maybe 2" block under LLE), mini squats. Initiate HEP when appropriate (try mini squats at counter?)  Consulted and Agree with Plan of Care Patient;Family member/caregiver  Family Member Consulted dad, Hernandez         Patient will benefit from skilled therapeutic intervention in order to improve the following deficits and impairments:     Visit Diagnosis: Hemiplegia of right dominant side due to noncerebrovascular etiology, unspecified hemiplegia type ([place])  Unsteadiness on feet  Muscle weakness (generalized)  Other abnormalities of gait and mobility     Problem List Patient Active Problem List   Diagnosis Date Noted  . Goals of care, counseling/discussion 12/16/2018  . Palliative care by specialist   .  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 Spastic hemiparesis ([place])   . Cerebral edema (HCC)   .  Intracranial tumor ([place])   . Steroid-induced hyperglycemia   . Spastic hemiplegia affecting nondominant side ([place])   . Hyponatremia   . Transaminitis   . Leucocytosis   . Right spastic hemiplegia ([place]) 10/21/2018  . Dysphagia 10/21/2018  . 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 Brain mass   . ICH (intracerebral hemorrhage) ([place]) 10/13/2018    Arliss Journey, PT, DPT  12/24/2018, 10:48 AM  [facility] [address] [place], [place], [address] Phone: [phone]   Fax:  [phone]  Name: Gregory Moreno MRN: [id number] Date of Birth: [birth year]  This note is not being shared with the patient for the following reason: To respect privacy (The patient or proxy has requested that the information not be shared).

## 2018-12-25 ENCOUNTER — Other Ambulatory Visit: Payer: Self-pay

## 2018-12-25 ENCOUNTER — Other Ambulatory Visit: Payer: Self-pay | Admitting: Internal Medicine

## 2018-12-25 ENCOUNTER — Ambulatory Visit
Admission: RE | Admit: 2018-12-25 | Discharge: 2018-12-25 | Disposition: A | Discharge status: Home / Self Care | Payer: 59 | Source: Ambulatory Visit | Attending: Radiation Oncology | Admitting: Radiation Oncology

## 2018-12-25 DIAGNOSIS — Z510 Encounter for antineoplastic radiation therapy: Secondary | ICD-10-CM | POA: Diagnosis not present

## 2018-12-25 DIAGNOSIS — C712 Malignant neoplasm of temporal lobe: Secondary | ICD-10-CM

## 2018-12-26 ENCOUNTER — Ambulatory Visit
Admission: RE | Admit: 2018-12-26 | Discharge: 2018-12-26 | Disposition: A | Discharge status: Home / Self Care | Payer: 59 | Source: Ambulatory Visit | Attending: Radiation Oncology | Admitting: Radiation Oncology

## 2018-12-26 ENCOUNTER — Other Ambulatory Visit: Payer: Self-pay

## 2018-12-26 ENCOUNTER — Ambulatory Visit: Payer: 59 | Admitting: Physical Therapy

## 2018-12-26 ENCOUNTER — Encounter: Payer: Self-pay | Admitting: Occupational Therapy

## 2018-12-26 ENCOUNTER — Encounter: Payer: Self-pay | Admitting: Physical Therapy

## 2018-12-26 ENCOUNTER — Ambulatory Visit: Payer: 59 | Admitting: Occupational Therapy

## 2018-12-26 ENCOUNTER — Ambulatory Visit: Payer: 59

## 2018-12-26 DIAGNOSIS — R482 Apraxia: Secondary | ICD-10-CM

## 2018-12-26 DIAGNOSIS — G8191 Hemiplegia, unspecified affecting right dominant side: Secondary | ICD-10-CM

## 2018-12-26 DIAGNOSIS — R2681 Unsteadiness on feet: Secondary | ICD-10-CM

## 2018-12-26 DIAGNOSIS — R4701 Aphasia: Secondary | ICD-10-CM

## 2018-12-26 DIAGNOSIS — R2689 Other abnormalities of gait and mobility: Secondary | ICD-10-CM

## 2018-12-26 DIAGNOSIS — Z510 Encounter for antineoplastic radiation therapy: Secondary | ICD-10-CM | POA: Diagnosis not present

## 2018-12-26 DIAGNOSIS — R41842 Visuospatial deficit: Secondary | ICD-10-CM

## 2018-12-26 DIAGNOSIS — M6281 Muscle weakness (generalized): Secondary | ICD-10-CM

## 2018-12-26 DIAGNOSIS — R29818 Other symptoms and signs involving the nervous system: Secondary | ICD-10-CM

## 2018-12-26 DIAGNOSIS — R41841 Cognitive communication deficit: Secondary | ICD-10-CM

## 2018-12-26 DIAGNOSIS — R471 Dysarthria and anarthria: Secondary | ICD-10-CM

## 2018-12-26 DIAGNOSIS — C712 Malignant neoplasm of temporal lobe: Secondary | ICD-10-CM

## 2018-12-26 MED ORDER — SONAFINE EX EMUL
1.0000 "application " | Freq: Two times a day (BID) | CUTANEOUS | Status: DC
  Administered 2018-12-26: 1 via TOPICAL

## 2018-12-26 NOTE — Therapy (Signed)
[facility] [address] [place], [place], [address] Phone: [phone]   Fax:  [phone]  Speech Language Pathology Treatment  Patient Details  Name: Gregory Moreno MRN: [id number] Date of Birth: [birth year] Referring Provider (SLP): Alger Simons, MD   Encounter Date: 12/26/2018  End of Session - 12/26/18 1612    Visit Number  3    Number of Visits  25    Date for SLP Re-Evaluation  03/17/19    SLP Start Time  [id number]    SLP Stop Time   1445    SLP Time Calculation (min)  41 min    Activity Tolerance  Patient tolerated treatment well       History reviewed. No pertinent past medical history.  Past Surgical History:  Procedure Laterality Date  . CRANIOTOMY Left 10/13/2018   Procedure: LEFT CRANIOTOMY FOR TUMOR RESECTION;  Surgeon: Judith Part, MD;  Location: [facility];  Service: Neurosurgery;  Laterality: Left;    There were no vitals filed for this visit.  Subjective Assessment - 12/26/18 1413    Subjective  "Fine.. ..you?"    Patient is accompained by:  Family member   dad   Currently in Pain?  No/denies            ADULT SLP TREATMENT - 12/26/18 1413      General Information   Behavior/Cognition  Alert;Cooperative;Lethargic   very flat affet     Treatment Provided   Treatment provided  Cognitive-Linquistic      Cognitive-Linquistic Treatment   Treatment focused on  Aphasia    Skilled Treatment  SLP completed the WAB-R - pt scored as moderate Broca's aphasia. 11-spontaneous speech, 138 Auditory Verbal comperhension, 6.2 Repetition, 5.1 Naming and word Finding  - 72.2 Aphasia Quotient. SLP engaged pt in simple divergent naming task in which pt req'd max cues to generate >2 items. Phonemic cues and in most cases semantic cues were not helpful - most help came from written (spelling) cues as a last resort. Pt very often unaware of his verbal errors. In conversation pt with very little  difference in responses - req'd usual mod-max A for one-word answers re salient information from pt's life. SLP had pt take deep breath and shout "hey" with minor chnage in volume. SLP then used modeling and other visual cues (exaggerated deep breath) and had pt shout "ah" and "hey" with incr'd volume, consistently. SLP told father to model full breath and provide auditory cues for louder speech with ONE WORD. Economist stated he was having pt say whole sentences - SLP cautioned father that pt's deficit was partly (likely) due to reduced muscule memory so keeping it simple was best option currently. Father voiced understanding.      Assessment / Recommendations / Plan   Plan  Continue with current plan of care      Progression Toward Goals   Progression toward goals  Progressing toward goals       SLP Education - 12/26/18 1611    Education Details  modeling practice with louder speech, need to keep practice at one-two words MAX right now due to muscle memory deficit likely    Person(s) Educated  Patient;Parent(s)    Methods  Explanation;Demonstration;Verbal cues    Comprehension  Verbalized understanding;Verbal cues required;Need further instruction       SLP Short Term Goals - 12/26/18 1613      SLP SHORT TERM GOAL #1   Title  pt will complete aphasia  assessment of auditory comprehension and verbal expression within 1-2 sessions    Status  Achieved      SLP SHORT TERM GOAL #2   Title  pt will demo understanding of more complex commands (spoken) with 80% and occasional min cues over three sessions    Time  4    Period  Weeks    Status  On-going      SLP SHORT TERM GOAL #3   Title  pt will answer pertinent questions with at least 3 words    Time  4    Period  Weeks    Status  On-going      SLP SHORT TERM GOAL #4   Title  pt will demo sustained/selective attention necessary to complete efficacious speech therapy in 6 of the first 8 sessions    Time  4    Period  Weeks   or 8  sessions   Status  On-going       SLP Long Term Goals - 12/26/18 1614      SLP LONG TERM GOAL #1   Title  pt will demo understanding of more complex commands (spoken) with 85% and rare min cues over three sessions    Time  8    Period  Weeks   or 17 sessions, for all LTGs   Status  On-going      SLP LONG TERM GOAL #2   Title  pt will answer questions with WNL verbal responses 90% of the time using compensations for anomia PRN, in 3 sessions    Time  8    Period  Weeks    Status  On-going      SLP LONG TERM GOAL #3   Title  pt will sustain loud /a/ with average mid 70s dB over 4 sessions    Time  8    Period  Weeks    Status  On-going      SLP LONG TERM GOAL #4   Title  pt will engage in simple conversation for 5 minutes with average loudness mid 60s dB over 3 sessions    Time  8    Period  Weeks    Status  On-going       Plan - 12/26/18 1612    Clinical Impression Statement  Pt with mod Broca's aphasia, dysarthria/voice, and cognitive communication deficits. Education with father today (see "pt education"). Pt would ocnt to benefit from skilled ST focusing on primarily receptive and expressive language but also at some point in rehab process focus on speech loudness and cognitive communication skills. Depending on pt progress this may occur during pt's next 24 ST visits.    Speech Therapy Frequency  2x / week    Duration  --   12 weeks or 25 total visits   Treatment/Interventions  Environmental controls;Functional tasks;Compensatory techniques;Multimodal communcation approach;SLP instruction and feedback;Cueing hierarchy;Language facilitation;Cognitive reorganization;Internal/external aids;Patient/family education    Potential to Achieve Goals  Good    Potential Considerations  Co-morbidities;Ability to learn/carryover information;Severity of impairments;Other (comment)   unsure of pt's medical prognosis post-rad/chemo   Consulted and Agree with Plan of Care  Patient;Family  member/caregiver    Family Member Consulted  father       Patient will benefit from skilled therapeutic intervention in order to improve the following deficits and impairments:   Aphasia  Dysarthria and anarthria  Cognitive communication deficit    Problem List Patient Active Problem List   Diagnosis Date Noted  .  Goals of care, counseling/discussion 12/16/2018  . Palliative care by specialist   . 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 Spastic hemiparesis ([facility])   . Cerebral edema ([facility])   . Intracranial tumor ([facility])   . Steroid-induced hyperglycemia   . Spastic hemiplegia affecting nondominant side ([facility])   . Hyponatremia   . Transaminitis   . Leucocytosis   . Right spastic hemiplegia ([facility]) 10/21/2018  . Dysphagia 10/21/2018  . 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 Brain mass   . ICH (intracerebral hemorrhage) ([facility]) 10/13/2018    [facility] ,[place], [place]  12/26/2018, 4:27 PM  [facility] [address] [place], [place], [address] Phone: [phone]   Fax:  [phone]   Name: ROMMEL PANDA MRN: [id number] Date of Birth: Mar 14, 1995

## 2018-12-26 NOTE — Patient Instructions (Signed)
   Do 10-15 loud one word repetitions x4-5/day for muscle memory for louder speech

## 2018-12-26 NOTE — Therapy (Signed)
[facility] [address] [place], [place], [address] Phone: [phone]   Fax:  [phone]  Occupational Therapy Treatment  Patient Details  Name: Gregory Moreno MRN: [id number] Date of Birth: 12/19/1995 Referring Provider (OT): Mar[place]e [place]   Encounter Date: 12/26/2018  OT End of Session - 12/26/18 1654    Visit Number  3    Number of Visits  25    Date for OT Re-Evaluation  03/17/19    OT Start Time  1450    OT Stop Time  1530    OT Time Calculation (min)  40 min    Activity Tolerance  Patient tolerated treatment well    Behavior During Therapy  [facility] for tasks assessed/performed       History reviewed. No pertinent past medical history.  Past Surgical History:  Procedure Laterality Date  . CRANIOTOMY Left 10/13/2018   Procedure: LEFT CRANIOTOMY FOR TUMOR RESECTION;  Surgeon: Judith Part, MD;  Location: [facility];  Service: Neurosurgery;  Laterality: Left;    There were no vitals filed for this visit.  Subjective Assessment - 12/26/18 1648    Subjective   "Not much"    Patient is accompanied by:  Family member    Currently in Pain?  No/denies    Pain Score  0-No pain                   OT Treatments/Exercises (OP) - 12/26/18 0001      ADLs   Functional Mobility  Continued practice of level surface squat pivot transfer to right and left.  Patient able to complete one transfer toward left with only supervision assistance.        Neurological Re-education Exercises   Other Exercises 1  Neuromuscular reeducation to address postural control and balance in sitting and standing.  Worked on body on arm motion , and activating right arm into surface to prepare for use during transitional movements, e.g. sidelying to sit.  Patient with imprved ability to activate elbow extension today, also assisted reach patterns of shoulder flex with elbow ext, and shoulder ext with elbow flexion.  Patient with  improved ability to activate hip and knee extensors for brief periods of time, but when arm demand added, patient unable to sustain LE activation, and unable to respond in time to correct loss of balance.  Patient with delay to motor response - so critical to allow time for him to respond to directives.  Dad present this session.      Other Exercises 2  Worked on sit to stand and stand to sit incoporating arm in raised surface.  Patient with strong adduction internal rotation in right leg with attempts to stand.  If these patterns blocked, patient has difficulty standing - recruiting new motor pattern.  Patient needed faciltiation for trunk flexing at hips to return to sitting from stand position.  Hesitant to unlock L eft knee for descent   Does best with repetition.                 OT Short Term Goals - 12/26/18 1656      OT SHORT TERM GOAL #1   Title  Patient will complete an HEP designed to improve active motion in RUE due 01/16/19    Status  On-going      OT SHORT TERM GOAL #2   Title  Patient will reach forward with shoulder flexion, elbow extension pattern to make contact with target in  midline and 8-12' from chest with intermittent assist    Status  On-going      OT SHORT TERM GOAL #3   Title  Patient will transfer from wheelchair to commode with no more than supervision assistance - transferring either toward right or toward left side    Status  On-going      OT SHORT TERM GOAL #4   Title  Patient will grasp and release a cylindrical object 2-3" in diameter with min assist    Status  On-going      OT SHORT TERM GOAL #5   Title  Patient will dress lower body with mod assist    Status  On-going        OT Long Term Goals - 12/17/18 1606      OT LONG TERM GOAL #1   Title  Patient will dress himself with no greater than set up assistance    Time  12    Period  Weeks    Status  New    Target Date  03/17/19      OT LONG TERM GOAL #2   Title  Patient will shower with  supervision    Time  12    Period  Weeks    Status  New      OT LONG TERM GOAL #3   Title  Patient will cut food on plate with modified independence    Time  12    Period  Weeks    Status  New      OT LONG TERM GOAL #4   Title  Patient will demonstrate ability to retrieve a lightweight item (less than 2lb) from waist height or lower and transport 6-12 inches laterally with RUE    Time  12    Period  Weeks    Status  New      OT LONG TERM GOAL #5   Title  Patient will demonstrate RUE  mid level reach with minimal LUE support to hit a target at chest height directly in front of body    Time  12    Period  Walker - 12/26/18 1654    Clinical Impression Statement  Patient is showing steady improvement in his ability to activate proximal RUE musculature.  Significant postural and perceptual challenges persist    OT Frequency  2x / week    OT Duration  12 weeks    OT Treatment/Interventions  Self-care/ADL training;DME and/or AE instruction;Splinting;Balance training;Aquatic Therapy;Therapeutic activities;Therapeutic exercise;Cognitive remediation/compensation;Neuromuscular education;Functional Mobility Training;Visual/perceptual remediation/compensation;Electrical Stimulation;Manual Therapy;Patient/family education    Plan  Transfer to mat table - dynamic sit balance, NMR RUE/Trunk - forced use simple, consider brace, start HEP/HAP, check splint    Consulted and Agree with Plan of Care  Patient;Family member/caregiver    Family Member Consulted  dad- [place]       Patient will benefit from skilled therapeutic intervention in order to improve the following deficits and impairments:           Visit Diagnosis: 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 Goals of care, counseling/discussion 12/16/2018  . Palliative care by specialist   .  Glioblastoma multiforme of temporal lobe ([facility]) 11/11/2018  . Spastic hemiparesis ([facility])   . Cerebral edema (HCC)   . Intracranial tumor ([facility])   . Steroid-induced hyperglycemia   . Spastic hemiplegia affecting nondominant side ([facility])   . Hyponatremia   . Transaminitis   . Leucocytosis   . Right spastic hemiplegia ([facility]) 10/21/2018  . Dysphagia 10/21/2018  . 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 Brain mass   . ICH (intracerebral hemorrhage) ([facility]) 10/13/2018    Mariah Milling, OTR/L 12/26/2018, 4:58 PM  [facility] [address] [place], [place], [address] Phone: [phone]   Fax:  [phone]  Name: Gregory Moreno MRN: [id number] Date of Birth: 01-Jan-1996

## 2018-12-27 NOTE — Therapy (Signed)
[facility] [address] [place], [place], [address] Phone: [phone]   Fax:  [phone]  Physical Therapy Treatment  Patient Details  Name: Gregory Moreno MRN: [id number] Date of Birth: 1995/09/19 Referring Provider (PT): Alger Simons, MD   Encounter Date: 12/26/2018  PT End of Session - 12/27/18 0926    Visit Number  3    Number of Visits  25    Authorization Type  [employer] - $40 co-pay, will include all 3 disciplines if seen on the same day.    PT Start Time  1533    PT Stop Time  1617    PT Time Calculation (min)  44 min    Equipment Utilized During Treatment  Gait belt    Activity Tolerance  Patient tolerated treatment well    Behavior During Therapy  WFL for tasks assessed/performed       History reviewed. No pertinent past medical history.  Past Surgical History:  Procedure Laterality Date  . CRANIOTOMY Left 10/13/2018   Procedure: LEFT CRANIOTOMY FOR TUMOR RESECTION;  Surgeon: Judith Part, MD;  Location: [facility];  Service: Neurosurgery;  Laterality: Left;    There were no vitals filed for this visit.  Subjective Assessment - 12/26/18 1533    Subjective  No falls. No new complaints.    Limitations  Sitting;Walking;Standing    How long can you walk comfortably?  100' with hemiwalker    Patient Stated Goals  wants to improve his walking, get back to normal    Currently in Pain?  No/denies               12/28/18 0001  Transfers  Squat Pivot Transfers 4: Min assist;4: Min Production designer, theatre/television/film Details (indicate cue type and reason) needs initial demonstrative cue for head hips relationship as initially pt goes to perform a stand pivot transfer. min A from w/c > mat table at pelvis, min guard from mat table > w/c  Comments Massed practice sit <> stands at edge of mat table added 2" block under LLE to shift weight to R, elevated mat table. Pt using LUE to help push up from table, min  guard/min A from therapist at pt's R knee to prevent from buckling. In standing cues to "stand tall" on RLE to activate hip/knee extensors. Needed min A for descent as pt lacks eccentric control as does not bend from hips - even after demonstrative and manual cueing. Pt will continue to need practice. Pt better understanding this session of needing to reach back with LUE towards w/c/mat before sitting down.   Neuro Re-ed   Neuro Re-ed Details  Standing in // bars, with mirror anteriorly, weight shifting towards R with therapist manual facilitation, had pt place RUE on // bars for weight bearing. Attempted to step LLE for a couple of reps to red circle dot on floor for increased step length of LLE during gait and to improve stance phase on R- pt demonstrating increased genu recurvatum on RLE as well as increased pelvic retraction with trunk lean to the R. At countertop with w/c posteriorly- massed practice mini squats aiming to hold BUE onto kitchen sink, ball placed between pt's thighs to prevent hip IR and ADD when standing, needed simple verbal and tactile cues for technique. Min A from therapist at pt's R knee to prevent from buckling, needed cues to stand tall for R hip extension and knee extension. Cues at top to squeeze glutes, with  pt able to perform. Cues for upright posture and looking straight ahead.                        PT Short Term Goals - 12/18/18 1826      PT SHORT TERM GOAL #1   Title  Patient and pt's family will be independent with initial HEP designed to improve movement and strength in RLE.  ALL STGS DUE 01/15/19    Time  4    Period  Weeks    Status  New    Target Date  01/15/19      PT SHORT TERM GOAL #2   Title  Patient will stand at countertop for at least 5 minutes with supervision to increase tolerance for ADLs.    Baseline  4    Period  Weeks    Status  New      PT SHORT TERM GOAL #3   Title  Pt will perform stand pivot vs. squat pivot transfer to  either R or L from wc to mat table with supervision in order to decrease caregiver burden.    Time  4    Period  Weeks    Status  New      PT SHORT TERM GOAL #4   Title  Pt will perform all bed mobility with min guard/min A in order to decrease caregiver burden.    Time  4    Period  Weeks    Status  New      PT SHORT TERM GOAL #5   Title  Pt will ambulate at least 100' with hemiwalker, R AFO and min guard in order to improve household mobility.    Time  4    Period  Weeks    Status  New        PT Long Term Goals - 12/18/18 1827      PT LONG TERM GOAL #1   Title  Patient and pt's family will be independent with final HEP designed to improve movement and strength in RLE.  ALL LTGS DUE 03/12/19    Time  12    Period  Weeks    Status  New    Target Date  03/12/19      PT LONG TERM GOAL #2   Title  Pt will ambulate at least 300' with supervision and hemiwalker vs. LRAD in order to improve community mobility.    Time  12    Period  Weeks    Status  New      PT LONG TERM GOAL #3   Title  Pt will perform at least 10 sit <> stands from mat table with proper technique with no cueing and mod I in order to improve functional transfers and demo improved LE strength.    Time  12    Period  Weeks    Status  New      PT LONG TERM GOAL #4   Title  Pt will perform all bed mobility from level mat table with mod I in order to decrease caregiver burden.    Time  12    Period  Weeks    Status  New      PT LONG TERM GOAL #5   Title  Pt will perform 4 steps with step to pattern and single handrail with supervision.    Time  12    Period  Weeks    Status  New  12/28/18 2033  Plan  Clinical Impression Statement Focus of today's skilled session was strengthening of RLE, and NMR for weight bearing through RLE. In // bars when attempting to step with LLE to focus on stance phase on RLE, pt with increased genu recurvatum on RLE, pelvic retraction, and trunk lean to R. Pt does best  with single step commands as well as visual demonstration/cues. With mirror, pt better able to reorient to midline with verbal and tactile cues. Pt continues to need min A from therapist for poor eccentric control when sitting and pt not flexing from hip/trunk before sitting, even with cues. Will continue to progress towards LTGs.  Personal Factors and Comorbidities  (pt ungergoing chemo/radiation)  Examination-Activity Limitations Bed Mobility;Locomotion Level;Sit;Squat;Stairs;Stand;Transfers  Examination-Participation Restrictions Community Activity;School;Driving  Pt will benefit from skilled therapeutic intervention in order to improve on the following deficits Abnormal gait;Decreased activity tolerance;Decreased balance;Decreased cognition;Decreased coordination;Decreased safety awareness;Decreased range of motion;Decreased mobility;Difficulty walking;Decreased strength;Decreased endurance;Impaired tone  Stability/Clinical Decision Making Evolving/Moderate complexity  Rehab Potential Good  PT Frequency 2x / week  PT Duration 12 weeks  PT Treatment/Interventions ADLs/Self Care Home Management;Therapeutic exercise;Therapeutic activities;Functional mobility training;Stair training;Gait training;DME Instruction;Balance training;Neuromuscular re-education;Patient/family education;Orthotic Fit/Training;Wheelchair mobility training;Energy conservation;Passive range of motion  PT Next Visit Plan continue gait with hemi walker. NMR for weight shifting and stance on RLE. sit <> stands from higher mat table, mini squats. weight shifting toward L and stepping with RLE. Initiate HEP when appropriate (try mini squats at counter?)  Consulted and Agree with Plan of Care Patient;Family member/caregiver  Family Member Consulted dad, [place]         Patient will benefit from skilled therapeutic intervention in order to improve the following deficits and impairments:     Visit Diagnosis: Hemiplegia of right  dominant side due to noncerebrovascular etiology, unspecified hemiplegia type ([facility])  Unsteadiness on feet  Muscle weakness (generalized)  Other abnormalities of gait and mobility     Problem List Patient Active Problem List   Diagnosis Date Noted  . Goals of care, counseling/discussion 12/16/2018  . Palliative care by specialist   . Glioblastoma multiforme of temporal lobe ([facility]) 11/11/2018  . Spastic hemiparesis ([facility])   . Cerebral edema ([facility])   . Intracranial tumor ([facility])   . Steroid-induced hyperglycemia   . Spastic hemiplegia affecting nondominant side ([facility])   . Hyponatremia   . Transaminitis   . Leucocytosis   . Right spastic hemiplegia ([place] [place]) 10/21/2018  . Dysphagia 10/21/2018  . 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 Brain mass   . ICH (intracerebral hemorrhage) ([facility]) 10/13/2018    Arliss Journey, PT, DPT  12/27/2018, 9:27 AM  [facility] [address] [place], [place], [address] Phone: [phone]   Fax:  [phone]  Name: KELDRICK TIFFANY MRN: [id number] Date of Birth: 02-Apr-1995

## 2018-12-29 ENCOUNTER — Ambulatory Visit
Admission: RE | Admit: 2018-12-29 | Discharge: 2018-12-29 | Disposition: A | Discharge status: Home / Self Care | Payer: 59 | Source: Ambulatory Visit | Attending: Radiation Oncology | Admitting: Radiation Oncology

## 2018-12-29 ENCOUNTER — Ambulatory Visit: Payer: 59 | Admitting: Occupational Therapy

## 2018-12-29 ENCOUNTER — Encounter: Payer: Self-pay | Admitting: Occupational Therapy

## 2018-12-29 ENCOUNTER — Other Ambulatory Visit: Payer: Self-pay

## 2018-12-29 ENCOUNTER — Ambulatory Visit: Payer: 59

## 2018-12-29 ENCOUNTER — Ambulatory Visit: Payer: 59 | Admitting: Physical Therapy

## 2018-12-29 DIAGNOSIS — R41842 Visuospatial deficit: Secondary | ICD-10-CM

## 2018-12-29 DIAGNOSIS — Z510 Encounter for antineoplastic radiation therapy: Secondary | ICD-10-CM | POA: Diagnosis not present

## 2018-12-29 DIAGNOSIS — R29818 Other symptoms and signs involving the nervous system: Secondary | ICD-10-CM

## 2018-12-29 DIAGNOSIS — R482 Apraxia: Secondary | ICD-10-CM

## 2018-12-29 DIAGNOSIS — R471 Dysarthria and anarthria: Secondary | ICD-10-CM

## 2018-12-29 DIAGNOSIS — R2681 Unsteadiness on feet: Secondary | ICD-10-CM

## 2018-12-29 DIAGNOSIS — M6281 Muscle weakness (generalized): Secondary | ICD-10-CM

## 2018-12-29 DIAGNOSIS — R4701 Aphasia: Secondary | ICD-10-CM

## 2018-12-29 DIAGNOSIS — G8191 Hemiplegia, unspecified affecting right dominant side: Secondary | ICD-10-CM | POA: Diagnosis not present

## 2018-12-29 DIAGNOSIS — R41841 Cognitive communication deficit: Secondary | ICD-10-CM

## 2018-12-29 NOTE — Therapy (Signed)
[facility] [address] [place], [place], [address] Phone: [phone]   Fax:  [phone]  Occupational Therapy Treatment  Patient Details  Name: Gregory Moreno MRN: [id number] Date of Birth: Oct 27, 1995 Referring Provider (OT): Marlowe Shores   Encounter Date: 12/29/2018  OT End of Session - 12/29/18 1821    Visit Number  4    Number of Visits  25    Date for OT Re-Evaluation  03/17/19    OT Start Time  [id number]    OT Stop Time  1658    OT Time Calculation (min)  43 min    Activity Tolerance  Patient tolerated treatment well    Behavior During Therapy  [facility] for tasks assessed/performed       History reviewed. No pertinent past medical history.  Past Surgical History:  Procedure Laterality Date  . CRANIOTOMY Left 10/13/2018   Procedure: LEFT CRANIOTOMY FOR TUMOR RESECTION;  Surgeon: Judith Part, MD;  Location: [facility];  Service: Neurosurgery;  Laterality: Left;    There were no vitals filed for this visit.  Subjective Assessment - 12/29/18 1814    Subjective   Patient indicates no pain    Patient is accompanied by:  Family member    Currently in Pain?  No/denies    Pain Score  0-No pain                   OT Treatments/Exercises (OP) - 12/29/18 1815      Neurological Re-education Exercises   Other Exercises 1  Neuromuscular reedcuation to address head hip relationship and LE activation during a level surface transfer right or left.  Patient today able to state "opposite" when asked which direction head should move in relation to hips.  Patient able to complete multiple squat pivot transfers without physical assiatnce following set up and initial physical guidance.  Worked on postural control, bias mass toward right side to load and add demand to right UE.  Patient with delayed motor response, but if given time consistently can activate push into surface - active triceps.  Patient hesitant with  forward flexion tasks in sitting.  In speaking with patient's mom, she indiicated that she has not allowed that forward flexion for fear of falling forward.  Discussed that patient needs to learn to safely come froward for many transitions, and that we would be working on this to help him regain his independence with ADL's.  Mom in agreement.  Worked on stand to sit transitions with BUE in weight bearing to promote hip /knee flexion to descend.               OT Education - 12/29/18 1820    Education Details  Spoke with mom and patient about the importance of allowing forward flexion in safe environment to increase independence with mobility and ADL    Person(s) Educated  Patient;Parent(s)    Methods  Explanation    Comprehension  Verbalized understanding;Need further instruction       OT Short Term Goals - 12/26/18 1656      OT SHORT TERM GOAL #1   Title  Patient will complete an HEP designed to improve active motion in RUE due 01/16/19    Status  On-going      OT SHORT TERM GOAL #2   Title  Patient will reach forward with shoulder flexion, elbow extension pattern to make contact with target in midline and 8-12' from chest with intermittent assist  Status  On-going      OT SHORT TERM GOAL #3   Title  Patient will transfer from wheelchair to commode with no more than supervision assistance - transferring either toward right or toward left side    Status  On-going      OT SHORT TERM GOAL #4   Title  Patient will grasp and release a cylindrical object 2-3" in diameter with min assist    Status  On-going      OT SHORT TERM GOAL #5   Title  Patient will dress lower body with mod assist    Status  On-going        OT Long Term Goals - 12/17/18 1606      OT LONG TERM GOAL #1   Title  Patient will dress himself with no greater than set up assistance    Time  12    Period  Weeks    Status  New    Target Date  03/17/19      OT LONG TERM GOAL #2   Title  Patient will shower  with supervision    Time  12    Period  Weeks    Status  New      OT LONG TERM GOAL #3   Title  Patient will cut food on plate with modified independence    Time  12    Period  Weeks    Status  New      OT LONG TERM GOAL #4   Title  Patient will demonstrate ability to retrieve a lightweight item (less than 2lb) from waist height or lower and transport 6-12 inches laterally with RUE    Time  12    Period  Weeks    Status  New      OT LONG TERM GOAL #5   Title  Patient will demonstrate RUE  mid level reach with minimal LUE support to hit a target at chest height directly in front of body    Time  12    Period  Weeks    Status  New            Plan - 12/29/18 1822    Clinical Impression Statement  Patient is tolerating all three disciplines with daily radiation.  Patient is showing steady progress with functional mobility and proximal activation of RUE    OT Frequency  2x / week    OT Duration  12 weeks    OT Treatment/Interventions  Self-care/ADL training;DME and/or AE instruction;Splinting;Balance training;Aquatic Therapy;Therapeutic activities;Therapeutic exercise;Cognitive remediation/compensation;Neuromuscular education;Functional Mobility Training;Visual/perceptual remediation/compensation;Electrical Stimulation;Manual Therapy;Patient/family education    Plan  nmr rue trunk, forced use for some distal components, start HAP/HEP    Family Member Larned       Patient will benefit from skilled therapeutic intervention in order to improve the following deficits and impairments:           Visit Diagnosis: 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 Goals of care,  counseling/discussion 12/16/2018  . Palliative care by specialist   . 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 Spastic hemiparesis ([place])   . Cerebral edema (HCC)   . Intracranial tumor ([place])   .  Steroid-induced hyperglycemia   . Spastic hemiplegia affecting nondominant side ([place])   . Hyponatremia   . Transaminitis   . Leucocytosis   . Right spastic hemiplegia ([place]) 10/21/2018  . Dysphagia 10/21/2018  . 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 Brain mass   . ICH (intracerebral hemorrhage) ([place]) 10/13/2018    Mariah Milling, OTR/L 12/29/2018, 6:24 PM  [facility] [address] [place], [place], [address] Phone: [phone]   Fax:  [phone]  Name: RUSTYN CHARNLEY MRN: [id number] Date of Birth: [birth year]

## 2018-12-29 NOTE — Therapy (Signed)
[facility] [address] [place], [place], [address] Phone: [phone]   Fax:  [phone]  Physical Therapy Treatment  Patient Details  Name: Gregory Moreno MRN: [id number] Date of Birth: 13-Sep-1995 Referring Provider (PT): Alger Simons, MD   Encounter Date: 12/29/2018  PT End of Session - 12/29/18 1616    Visit Number  4    Number of Visits  25    Authorization Type  [employer] - $40 co-pay, will include all 3 disciplines if seen on the same day.    PT Start Time  1533    PT Stop Time  1613    PT Time Calculation (min)  40 min    Equipment Utilized During Treatment  Gait belt    Activity Tolerance  Patient tolerated treatment well    Behavior During Therapy  WFL for tasks assessed/performed       No past medical history on file.  Past Surgical History:  Procedure Laterality Date  . CRANIOTOMY Left 10/13/2018   Procedure: LEFT CRANIOTOMY FOR TUMOR RESECTION;  Surgeon: Judith Part, MD;  Location: [facility];  Service: Neurosurgery;  Laterality: Left;    There were no vitals filed for this visit.  Subjective Assessment - 12/29/18 1536    Subjective  No falls. Mom states that transfers are getting easier at home.    Patient is accompained by:  Family member   mom, [name].   Limitations  Sitting;Walking;Standing    How long can you walk comfortably?  100' with hemiwalker    Patient Stated Goals  wants to improve his walking, get back to normal    Currently in Pain?  No/denies                       [facility] Adult PT Treatment/Exercise - 12/29/18 1622      Transfers   Stand to Sit  4: Min assist    Stand to Sit Details (indicate cue type and reason)  Tactile cues for weight shifting;Tactile cues for initiation;Tactile cues for placement;Tactile cues for weight beaing;Visual cues for safe use of DME/AE    Stand to Sit Details  cues to attend to RUE to remove from R hand splint before sitting  back in w/c     Squat Pivot Transfers  4: Min assist;4: Min Cytogeneticist Details (indicate cue type and reason)  initial cueing for head/hips relationship      Ambulation/Gait   Ambulation/Gait  Yes    Ambulation/Gait Assistance  4: Min assist    Ambulation/Gait Assistance Details  Min A for balance and to prevent genu recurvatum of RLE during stance phase of gait. Min A also for RW navigation - pt able to use RUE to help push through forced use, however at times RW will tend to be pushed to the R from LUE. W/c follow for safety and prolonged seated rest breaks due to fatigue. Pt needing cues to "kick" RLE, as it tends to get stuck and manual facilitation from therapist at times to help shift weight to the L. Verbal cues to keep RW close to body. Before last rep of gait, pt demonstrating step through pattern vs. step to and cues to take a bigger step with LLE. Pt able to demonstrate step through pattern during last 30' of gait at end of session.      Ambulation Distance (Feet)  15 Feet   x1, 50'x1, 30'x1  Assistive device  Rolling walker;Other (Comment)   R AFO, R hand splint   Gait Pattern  Step-to pattern;Decreased stance time - right;Decreased step length - left;Decreased weight shift to right;Decreased hip/knee flexion - right;Right genu recurvatum;Trunk flexed;Narrow base of support;Poor foot clearance - right;Step-through pattern;Decreased dorsiflexion - right    Ambulation Surface  Level;Indoor               PT Short Term Goals - 12/18/18 1826      PT SHORT TERM GOAL #1   Title  Patient and pt's family will be independent with initial HEP designed to improve movement and strength in RLE.  ALL STGS DUE 01/15/19    Time  4    Period  Weeks    Status  New    Target Date  01/15/19      PT SHORT TERM GOAL #2   Title  Patient will stand at countertop for at least 5 minutes with supervision to increase tolerance for ADLs.    Baseline  4    Period  Weeks     Status  New      PT SHORT TERM GOAL #3   Title  Pt will perform stand pivot vs. squat pivot transfer to either R or L from wc to mat table with supervision in order to decrease caregiver burden.    Time  4    Period  Weeks    Status  New      PT SHORT TERM GOAL #4   Title  Pt will perform all bed mobility with min guard/min A in order to decrease caregiver burden.    Time  4    Period  Weeks    Status  New      PT SHORT TERM GOAL #5   Title  Pt will ambulate at least 100' with hemiwalker, R AFO and min guard in order to improve household mobility.    Time  4    Period  Weeks    Status  New        PT Long Term Goals - 12/18/18 1827      PT LONG TERM GOAL #1   Title  Patient and pt's family will be independent with final HEP designed to improve movement and strength in RLE.  ALL LTGS DUE 03/12/19    Time  12    Period  Weeks    Status  New    Target Date  03/12/19      PT LONG TERM GOAL #2   Title  Pt will ambulate at least 300' with supervision and hemiwalker vs. LRAD in order to improve community mobility.    Time  12    Period  Weeks    Status  New      PT LONG TERM GOAL #3   Title  Pt will perform at least 10 sit <> stands from mat table with proper technique with no cueing and mod I in order to improve functional transfers and demo improved LE strength.    Time  12    Period  Weeks    Status  New      PT LONG TERM GOAL #4   Title  Pt will perform all bed mobility from level mat table with mod I in order to decrease caregiver burden.    Time  12    Period  Weeks    Status  New      PT LONG TERM GOAL #5  Title  Pt will perform 4 steps with step to pattern and single handrail with supervision.    Time  12    Period  Weeks    Status  New            Plan - 12/29/18 1631    Clinical Impression Statement  Focus of today's skilled session was gait training with RW with R hand splint. Pt tolerated well - needed seated rest breaks between each bout of gait due  to fatigue, w/c follow due to safety. During 3rd bout of gait pt able to demonstrate step through pattern after demonstrative and verbal cues. Pt able to use RUE to help push RW through forced use, however still min A at times to push straight ahead. Pt is progresing well - will continue to progress from skilled PT to meet LTGs.    Personal Factors and Comorbidities  --   pt ungergoing chemo/radiation   Examination-Activity Limitations  Bed Mobility;Locomotion Level;Sit;Squat;Stairs;Stand;Transfers    Examination-Participation Restrictions  Community Activity;School;Driving    Stability/Clinical Decision Making  Evolving/Moderate complexity    Rehab Potential  Good    PT Frequency  2x / week    PT Duration  12 weeks    PT Treatment/Interventions  ADLs/Self Care Home Management;Therapeutic exercise;Therapeutic activities;Functional mobility training;Stair training;Gait training;DME Instruction;Balance training;Neuromuscular re-education;Patient/family education;Orthotic Fit/Training;Wheelchair mobility training;Energy conservation;Passive range of motion    PT Next Visit Plan  continue gait with RW. NMR for weight shifting and stance on RLE. sit <> stands from higher mat table, mini squats. weight shifting toward L and stepping with RLE. 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 Goals of care, counseling/discussion 12/16/2018  . Palliative care by specialist   . 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 Spastic hemiparesis ([place])   . Cerebral edema (HCC)   . Intracranial tumor ([place])   . Steroid-induced hyperglycemia   . Spastic hemiplegia affecting nondominant side ([place])   . Hyponatremia   . Transaminitis   . Leucocytosis   . Right spastic hemiplegia ([place]) 10/21/2018  . Dysphagia 10/21/2018  . 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 Brain mass   . ICH (intracerebral hemorrhage) ([place]) 10/13/2018    Arliss Journey, PT, DPT  12/29/2018, 4:33 PM  [facility] [address] [place], [place], [address] Phone: [phone]   Fax:  [phone]  Name: Gregory Moreno MRN: [id number] Date of Birth: 1995/10/28

## 2018-12-29 NOTE — Therapy (Signed)
[facility] [address] [place], [place], [address] Phone: [phone]   Fax:  [phone]  Speech Language Pathology Treatment  Patient Details  Name: Gregory Moreno MRN: [id number] Date of Birth: March 12, 1995 Referring Provider (SLP): Alger Simons, MD   Encounter Date: 12/29/2018  End of Session - 12/29/18 2347    Visit Number  4    Number of Visits  25    Date for SLP Re-Evaluation  03/17/19    SLP Start Time  1450    SLP Stop Time   1530    SLP Time Calculation (min)  40 min    Activity Tolerance  Patient tolerated treatment well       No past medical history on file.  Past Surgical History:  Procedure Laterality Date  . CRANIOTOMY Left 10/13/2018   Procedure: LEFT CRANIOTOMY FOR TUMOR RESECTION;  Surgeon: Judith Part, MD;  Location: [facility];  Service: Neurosurgery;  Laterality: Left;    There were no vitals filed for this visit.  Subjective Assessment - 12/29/18 1457    Subjective  "Not much."    Patient is accompained by:  Family member   mom   Currently in Pain?  No/denies            ADULT SLP TREATMENT - 12/29/18 1503      General Information   Behavior/Cognition  Alert;Cooperative;Lethargic      Treatment Provided   Treatment provided  Cognitive-Linquistic      Cognitive-Linquistic Treatment   Treatment focused on  Cognition;Dysarthria;Patient/family/caregiver education    Skilled Treatment  Cognition 14 minutes SLP suggested pt and family keep calendar (they are). SLP suggested family cross days off, and have pt read/tell pt what he will do next day prior to bedtime and then put calendar on breakfast table to review schedule the next morning with pt. (Speech tx-individual) SLP had pt repeat sentences "(conversational) with consistent cues for breath support and louder voice - pt averaged mid 60s dB. SLP combined language with louder speech and had pt name 3 items in a simple  category with loud voice/breath support. In more familiar/personal topics (airports) pt req'd max-total cues; for simple common category (holidays) pt req'd max-total A. Semantic cues not necessarily as successful as phonemeic cues.      Assessment / Recommendations / Plan   Plan  Continue with current plan of care      Progression Toward Goals   Progression toward goals  Progressing toward goals       SLP Education - 12/29/18 2346    Education Details  calendar to track appointments and how to foster better tracking    Person(s) Educated  Patient;Parent(s)    Methods  Explanation;Verbal cues    Comprehension  Verbalized understanding       SLP Short Term Goals - 12/29/18 2347      SLP SHORT TERM GOAL #1   Title  pt will complete aphasia assessment of auditory comprehension and verbal expression within 1-2 sessions    Status  Achieved      SLP SHORT TERM GOAL #2   Title  pt will demo understanding of more complex commands (spoken) with 80% and occasional min cues over three sessions    Time  3    Period  Weeks    Status  On-going      SLP SHORT TERM GOAL #3   Title  pt will answer pertinent questions with at least 3 words  Time  3    Period  Weeks    Status  On-going      SLP SHORT TERM GOAL #4   Title  pt will demo sustained/selective attention necessary to complete efficacious speech therapy in 6 of the first 8 sessions    Time  3    Period  Weeks   or 8 sessions   Status  On-going       SLP Long Term Goals - 12/29/18 2348      SLP LONG TERM GOAL #1   Title  pt will demo understanding of more complex commands (spoken) with 85% and rare min cues over three sessions    Time  7    Period  Weeks   or 17 sessions, for all LTGs   Status  On-going      SLP LONG TERM GOAL #2   Title  pt will answer questions with WNL verbal responses 90% of the time using compensations for anomia PRN, in 3 sessions    Time  7    Period  Weeks    Status  On-going      SLP LONG  TERM GOAL #3   Title  pt will sustain loud /a/ with average mid 70s dB over 4 sessions    Time  7    Period  Weeks    Status  On-going      SLP LONG TERM GOAL #4   Title  pt will engage in simple conversation for 5 minutes with average loudness mid 60s dB over 3 sessions    Time  7    Period  Weeks    Status  On-going       Plan - 12/29/18 2347    Clinical Impression Statement  Pt with mod Broca's aphasia, dysarthria/voice, and cognitive communication deficits. Education with father today (see "pt education"). Pt would ocnt to benefit from skilled ST focusing on primarily receptive and expressive language but also at some point in rehab process focus on speech loudness and cognitive communication skills. Depending on pt progress this may occur during pt's next 24 ST visits.    Speech Therapy Frequency  2x / week    Duration  --   12 weeks or 25 total visits   Treatment/Interventions  Environmental controls;Functional tasks;Compensatory techniques;Multimodal communcation approach;SLP instruction and feedback;Cueing hierarchy;Language facilitation;Cognitive reorganization;Internal/external aids;Patient/family education    Potential to Achieve Goals  Good    Potential Considerations  Co-morbidities;Ability to learn/carryover information;Severity of impairments;Other (comment)   unsure of pt's medical prognosis post-rad/chemo   Consulted and Agree with Plan of Care  Patient;Family member/caregiver    Family Member Consulted  father       Patient will benefit from skilled therapeutic intervention in order to improve the following deficits and impairments:   Aphasia  Dysarthria and anarthria  Cognitive communication deficit    Problem List Patient Active Problem List   Diagnosis Date Noted  . Goals of care, counseling/discussion 12/16/2018  . Palliative care by specialist   . 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 Spastic hemiparesis ([place])   . Cerebral  edema ([facility])   . Intracranial tumor ([place])   . Steroid-induced hyperglycemia   . Spastic hemiplegia affecting nondominant side ([place])   . Hyponatremia   . Transaminitis   . Leucocytosis   . Right spastic hemiplegia ([place]) 10/21/2018  . Dysphagia 10/21/2018  . 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 Brain mass   . ICH (intracerebral  hemorrhage) ([place]) 10/13/2018    [facility] ,[place], [place]  12/29/2018, 11:49 PM  [facility] [address] [place], [place], [address] Phone: [phone]   Fax:  [phone]   Name: Gregory Moreno MRN: [id number] Date of Birth: [birth year]

## 2018-12-30 ENCOUNTER — Telehealth: Payer: Self-pay | Admitting: Internal Medicine

## 2018-12-30 ENCOUNTER — Inpatient Hospital Stay: Payer: 59

## 2018-12-30 ENCOUNTER — Other Ambulatory Visit: Payer: Self-pay

## 2018-12-30 ENCOUNTER — Ambulatory Visit
Admission: RE | Admit: 2018-12-30 | Discharge: 2018-12-30 | Disposition: A | Discharge status: Home / Self Care | Payer: 59 | Source: Ambulatory Visit | Attending: Radiation Oncology | Admitting: Radiation Oncology

## 2018-12-30 ENCOUNTER — Inpatient Hospital Stay: Payer: 59 | Admitting: Internal Medicine

## 2018-12-30 VITALS — BP 116/77 | HR 97 | Temp 98.3°F | Resp 16 | Ht 67.0 in

## 2018-12-30 DIAGNOSIS — C712 Malignant neoplasm of temporal lobe: Secondary | ICD-10-CM

## 2018-12-30 DIAGNOSIS — Z510 Encounter for antineoplastic radiation therapy: Secondary | ICD-10-CM | POA: Diagnosis not present

## 2018-12-30 LAB — CBC WITH DIFFERENTIAL (CANCER CENTER ONLY)
Abs Immature Granulocytes: 1.26 10*3/uL — ABNORMAL HIGH (ref 0.00–0.07)
Basophils Absolute: 0 10*3/uL (ref 0.0–0.1)
Basophils Relative: 0 %
Eosinophils Absolute: 0.1 10*3/uL (ref 0.0–0.5)
Eosinophils Relative: 1 %
HCT: 44.4 % (ref 39.0–52.0)
Hemoglobin: 14 g/dL (ref 13.0–17.0)
Immature Granulocytes: 12 %
Lymphocytes Relative: 7 %
Lymphs Abs: 0.8 10*3/uL (ref 0.7–4.0)
MCH: 28.1 pg (ref 26.0–34.0)
MCHC: 31.5 g/dL (ref 30.0–36.0)
MCV: 89.2 fL (ref 80.0–100.0)
Monocytes Absolute: 0.7 10*3/uL (ref 0.1–1.0)
Monocytes Relative: 7 %
Neutro Abs: 7.7 10*3/uL (ref 1.7–7.7)
Neutrophils Relative %: 73 %
Platelet Count: 107 10*3/uL — ABNORMAL LOW (ref 150–400)
RBC: 4.98 MIL/uL (ref 4.22–5.81)
RDW: 16.7 % — ABNORMAL HIGH (ref 11.5–15.5)
WBC Count: 10.5 10*3/uL (ref 4.0–10.5)
nRBC: 0.2 % (ref 0.0–0.2)

## 2018-12-30 LAB — CMP (CANCER CENTER ONLY)
ALT: 76 U/L — ABNORMAL HIGH (ref 0–44)
AST: 16 U/L (ref 15–41)
Albumin: 3.4 g/dL — ABNORMAL LOW (ref 3.5–5.0)
Alkaline Phosphatase: 61 U/L (ref 38–126)
Anion gap: 13 (ref 5–15)
BUN: 18 mg/dL (ref 6–20)
CO2: 25 mmol/L (ref 22–32)
Calcium: 8.8 mg/dL — ABNORMAL LOW (ref 8.9–10.3)
Chloride: 101 mmol/L (ref 98–111)
Creatinine: 0.65 mg/dL (ref 0.61–1.24)
GFR, Est AFR Am: >60 mL/min (ref >60)
GFR, Estimated: >60 mL/min (ref >60)
Glucose, Bld: 93 mg/dL (ref 70–99)
Potassium: 3.9 mmol/L (ref 3.5–5.1)
Sodium: 139 mmol/L (ref 135–145)
Total Bilirubin: 0.3 mg/dL (ref 0.3–1.2)
Total Protein: 6.7 g/dL (ref 6.5–8.1)

## 2018-12-30 MED ORDER — DEXAMETHASONE 1 MG PO TABS: ORAL_TABLET | ORAL | 0 refills | Status: DC

## 2018-12-30 MED ORDER — METHYLPHENIDATE HCL 10 MG PO TABS: 10.0000 mg | ORAL_TABLET | Freq: Two times a day (BID) | ORAL | 0 refills | Status: DC

## 2018-12-30 MED ORDER — ESCITALOPRAM OXALATE 5 MG PO TABS: 5.0000 mg | ORAL_TABLET | Freq: Every day | ORAL | 0 refills | Status: DC

## 2018-12-30 NOTE — Progress Notes (Signed)
[facility] [place], [place] [address] [phone]   Interval Evaluation  Date of Service: 12/30/18 Patient Name: Gregory Moreno Patient MRN: [id number] Patient DOB: [birth year] Provider: Ventura Sellers, MD  Identifying Statement:  Gregory Moreno is a 23 y.o. male with left temporal glioblastoma   Referring Provider: [place], [facility] [address] 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      Biomarkers:  MGMT Unknown.  IDH 1/2 Wild type.  EGFR Unknown  TERT Unknown   Interval History:  Gregory Moreno presents today for follow up, now having completed week #4 of radiation therapy and concurrent Temodar.  He continues to improve strength in his right side.  He is still able to ambulate minimally around the home, slowly, with a supportive belt and a spotter.  Otherwise he utilizes the walker or wheelchair.  Remains with dense left visual field impairment.  Language also is slowly improving, he has good understanding but struggles to put sentences together.  No issues with Temodar or weaning decadron, currently on 46m daily.  Medications: Current Outpatient Medications on File Prior to Visit  Medication Sig Dispense Refill  . acetaminophen (TYLENOL) 325 MG tablet Take 2 tablets (650 mg total) by mouth every 4 (four) hours as needed for mild pain (temp > 100.5).    .Marland Kitchenamantadine (SYMMETREL) 100 MG capsule Take 1 capsule (100 mg total) by mouth 2 (two) times daily with breakfast and lunch. 60 capsule 1  . baclofen (LIORESAL) 10 MG tablet Take 1 tablet (10 mg total) by mouth at bedtime as needed for muscle spasms. 30  each 0  . dexamethasone (DECADRON) 4 MG tablet Take 1 tablet (4 mg total) by mouth every 12 (twelve) hours. 60 tablet 0  . escitalopram (LEXAPRO) 5 MG tablet Take 1 tablet (5 mg total) by mouth at bedtime. 30 tablet 0  . loratadine (CLARITIN) 10 MG tablet Take 10 mg by mouth daily as needed for allergies.    . methylphenidate (RITALIN) 10 MG tablet Take 1 tablet (10 mg total) by mouth 2 (two) times daily with breakfast and lunch. 60 tablet 0  . mirabegron ER (MYRBETRIQ) 25 MG TB24 tablet Take 1 tablet (25 mg total) by mouth daily. 30 tablet 0  . ondansetron (ZOFRAN) 4 MG tablet Take 1 tablet (4 mg total) by mouth every 4 (four) hours as needed for nausea or vomiting. 60 tablet 0  . pantoprazole (PROTONIX) 40 MG tablet Take 1 tablet (40 mg total) by mouth daily. 30 tablet 0  . senna-docusate (SENOKOT-S) 8.6-50 MG tablet Take 1 tablet by mouth 2 (two) times daily.    .Marland Kitchentemozolomide (TEMODAR) 100 MG capsule TAKE 1 CAPSULE AND 20MG CAPSULE (120MG TOTAL) BY MOUTH ONCE DAILY. MAY TAKE ON EMPTY STOMACH TO DECREASE NAUSEA AND VOMITING. 42 capsule 0  . temozolomide (TEMODAR) 20 MG capsule TAKE 1 CAPSULE AND 100MG CAPSULE (120MG TOTAL) BY MOUTH ONCE DAILY 30 capsule 0   No current facility-administered medications on file prior to visit.    Allergies: No Known Allergies Past Medical History: No past medical history on file. Past Surgical History:  Past Surgical History:  Procedure Laterality  Date  . CRANIOTOMY Left 10/13/2018   Procedure: LEFT CRANIOTOMY FOR TUMOR RESECTION;  Surgeon: Judith Part, MD;  Location: [facility];  Service: Neurosurgery;  Laterality: Left;   Social History:  Social History   Socioeconomic History  . Marital status: Single    Spouse name: Not on file  . Number of children: Not on file  . Years of education: Not on file  . Highest education level: Not on file  Occupational History  . Not on file  Tobacco Use  . Smoking status: Never Smoker  . Smokeless tobacco:  Never Used  Substance and Sexual Activity  . Alcohol use: Not on file  . Drug use: Not on file  . Sexual activity: Not on file  Other Topics Concern  . Not on file  Social History Narrative  . Not on file   Social Determinants of Health   Financial Resource Strain:   . Difficulty of Paying Living Expenses: Not on file  Food Insecurity:   . Worried About Charity fundraiser in the Last Year: Not on file  . Ran Out of Food in the Last Year: Not on file  Transportation Needs:   . Lack of Transportation (Medical): Not on file  . Lack of Transportation (Non-Medical): Not on file  Physical Activity:   . Days of Exercise per Week: Not on file  . Minutes of Exercise per Session: Not on file  Stress:   . Feeling of Stress : Not on file  Social Connections:   . Frequency of Communication with Friends and Family: Not on file  . Frequency of Social Gatherings with Friends and Family: Not on file  . Attends Religious Services: Not on file  . Active Member of Clubs or Organizations: Not on file  . Attends Archivist Meetings: Not on file  . Marital Status: Not on file  Intimate Partner Violence:   . Fear of Current or Ex-Partner: Not on file  . Emotionally Abused: Not on file  . Physically Abused: Not on file  . Sexually Abused: Not on file   Family History: No family history on file.  Review of Systems: Constitutional: Denies fevers, chills or abnormal weight loss Eyes: Denies blurriness of vision Ears, nose, mouth, throat, and face: Denies mucositis or sore throat Respiratory: Denies cough, dyspnea or wheezes Cardiovascular: Denies palpitation, chest discomfort or lower extremity swelling Gastrointestinal:  Denies nausea, constipation, diarrhea GU: Denies dysuria or incontinence Skin: Denies abnormal skin rashes Neurological: Per HPI Musculoskeletal: Denies joint pain, back or neck discomfort. No decrease in ROM Behavioral/Psych: Denies anxiety, disturbance in thought  content, and mood instability  Physical Exam: Vitals:   12/30/18 1151  BP: 116/77  Pulse: 97  Resp: 16  Temp: 98.3 F (36.8 C)  SpO2: 99%   KPS: 60. General: Alert, cooperative, pleasant, in no acute distress Head: Craniotomy scar noted, dry and intact. EENT: No conjunctival injection or scleral icterus. Oral mucosa moist Lungs: Resp effort normal Cardiac: Regular rate and rhythm Abdomen: Soft, non-distended abdomen Skin: No rashes cyanosis or petechiae. Extremities: No clubbing or edema  Neurologic Exam: Mental Status: Awake, alert, attentive to examiner. Oriented to self and environment. Language is densely impaired with regards to fluency.  Comprehension impaired to multi-step complex commands.  Simple repetition is preserved. Cranial Nerves: Visual acuity is grossly normal. Dense R homonymous hemianopia. Extra-ocular movements intact. No ptosis. Face is symmetric, tongue midline. Motor: Tone and bulk are normal. Power is 3/5 in right arm  and leg, 5/5 on left side. Reflexes are symmetric, no pathologic reflexes present. Sensory: Intact to light touch and temperature Gait: Deferred  Labs: I have reviewed the data as listed    Component Value Date/Time   NA 139 12/30/2018 1101   K 3.9 12/30/2018 1101   CL 101 12/30/2018 1101   CO2 25 12/30/2018 1101   GLUCOSE 93 12/30/2018 1101   BUN 18 12/30/2018 1101   CREATININE 0.65 12/30/2018 1101   CALCIUM 8.8 (L) 12/30/2018 1101   PROT 6.7 12/30/2018 1101   ALBUMIN 3.4 (L) 12/30/2018 1101   AST 16 12/30/2018 1101   ALT 76 (H) 12/30/2018 1101   ALKPHOS 61 12/30/2018 1101   BILITOT 0.3 12/30/2018 1101   GFRNONAA >60 12/30/2018 1101   GFRAA >60 12/30/2018 1101   Lab Results  Component Value Date   WBC 10.5 12/30/2018   NEUTROABS 7.7 12/30/2018   HGB 14.0 12/30/2018   HCT 44.4 12/30/2018   MCV 89.2 12/30/2018   PLT 107 (L) 12/30/2018     Assessment/Plan 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  He is clinically stable now having completed first four weeks of IMRT and concurrent Temodar at 24m/m2 daily.    We are very pleased with his continued improvement through aggressive neuro-directed PT and OT.  Chemotherapy should be held for the following:  ANC less than 1,000  Platelets less than 100,000  LFT or creatinine greater than 2x ULN  If clinical concerns/contraindications develop  Given need for greater understanding of tumor genetic makeup and possible targets for future therapy, we will recommend sending available tissue to [facility] for whole exome and RNA sequencing.   Decadron should be decreased to 266mdaily x1 week, then 64m78maily for one week.  He will follow up again in 2 weeks with labs for evaluation, during week #6 of IMRT/TMZ.  At that time we will discuss decreasing other medications such as Ritalin and Amantadine.  All questions were answered. The patient knows to call the clinic with any problems, questions or concerns. No barriers to learning were detected.  The total time spent in the encounter was 25 minutes and more than 50% was on counseling and review of test results   ZacVentura SellersD Medical Director of Neuro-Oncology [facility] [place]/15/20 11:46 AM

## 2018-12-30 NOTE — Telephone Encounter (Signed)
No los per 12/15.

## 2018-12-31 ENCOUNTER — Telehealth: Payer: Self-pay | Admitting: *Deleted

## 2018-12-31 ENCOUNTER — Ambulatory Visit
Admission: RE | Admit: 2018-12-31 | Discharge: 2018-12-31 | Disposition: A | Discharge status: Home / Self Care | Payer: 59 | Source: Ambulatory Visit | Attending: Radiation Oncology | Admitting: Radiation Oncology

## 2018-12-31 ENCOUNTER — Ambulatory Visit: Payer: 59 | Admitting: Occupational Therapy

## 2018-12-31 ENCOUNTER — Ambulatory Visit: Payer: 59

## 2018-12-31 ENCOUNTER — Other Ambulatory Visit: Payer: Self-pay

## 2018-12-31 ENCOUNTER — Ambulatory Visit: Payer: 59 | Admitting: Physical Therapy

## 2018-12-31 ENCOUNTER — Encounter: Payer: Self-pay | Admitting: Occupational Therapy

## 2018-12-31 DIAGNOSIS — G8191 Hemiplegia, unspecified affecting right dominant side: Secondary | ICD-10-CM

## 2018-12-31 DIAGNOSIS — R2681 Unsteadiness on feet: Secondary | ICD-10-CM

## 2018-12-31 DIAGNOSIS — R471 Dysarthria and anarthria: Secondary | ICD-10-CM

## 2018-12-31 DIAGNOSIS — R29818 Other symptoms and signs involving the nervous system: Secondary | ICD-10-CM

## 2018-12-31 DIAGNOSIS — R41841 Cognitive communication deficit: Secondary | ICD-10-CM

## 2018-12-31 DIAGNOSIS — M6281 Muscle weakness (generalized): Secondary | ICD-10-CM

## 2018-12-31 DIAGNOSIS — R482 Apraxia: Secondary | ICD-10-CM

## 2018-12-31 DIAGNOSIS — R4701 Aphasia: Secondary | ICD-10-CM

## 2018-12-31 DIAGNOSIS — R41842 Visuospatial deficit: Secondary | ICD-10-CM

## 2018-12-31 DIAGNOSIS — G939 Disorder of brain, unspecified: Secondary | ICD-10-CM

## 2018-12-31 DIAGNOSIS — Z510 Encounter for antineoplastic radiation therapy: Secondary | ICD-10-CM | POA: Diagnosis not present

## 2018-12-31 DIAGNOSIS — D496 Neoplasm of unspecified behavior of brain: Secondary | ICD-10-CM

## 2018-12-31 NOTE — Therapy (Signed)
[facility] [address] [place], [place], [address] Phone: [phone]   Fax:  [phone]  Speech Language Pathology Treatment  Patient Details  Name: Gregory Moreno MRN: [id number] Date of Birth: Nov 02, 1995 Referring Provider (SLP): Alger Simons, MD   Encounter Date: 12/31/2018  End of Session - 12/31/18 1547    Visit Number  5    Number of Visits  25    Date for SLP Re-Evaluation  03/17/19    SLP Start Time  [id number]    SLP Stop Time   [id number]    SLP Time Calculation (min)  44 min    Activity Tolerance  Patient tolerated treatment well       History reviewed. No pertinent past medical history.  Past Surgical History:  Procedure Laterality Date  . CRANIOTOMY Left 10/13/2018   Procedure: LEFT CRANIOTOMY FOR TUMOR RESECTION;  Surgeon: Judith Part, MD;  Location: [facility];  Service: Neurosurgery;  Laterality: Left;    There were no vitals filed for this visit.  Subjective Assessment - 12/31/18 1407    Subjective  "Not much."    Patient is accompained by:  Family member   dad   Currently in Pain?  No/denies            ADULT SLP TREATMENT - 12/31/18 1409      General Information   Behavior/Cognition  Alert;Cooperative   flat affect     Treatment Provided   Treatment provided  Cognitive-Linquistic      Cognitive-Linquistic Treatment   Treatment focused on  Cognition;Dysarthria;Patient/family/caregiver education;Aphasia    Skilled Treatment  Cognition- 15 minutes: SLP worked with pt's attention with divergent naming. SLP to cue pt consistently to name items >1 due to suspected deficit with sustained attention. (Speech tx individual) Pt req'd extra time consistently to generate >1 item in simple, personal relevant category. Pt with frequent semantic paraphasia with awareness of errors indicated 35% of the time. Both semantic and phonemic errors noted - SLP cont to ? extent of pt's verbal apraxia.    Consistent cues for pt to incr his loudness with max cues (visual, verbal, and demo cues). SLP encouraged dad to cont with language tasks at home, and also encourgaed some simple but limited (time-wise) cognitive tasks (Checkers, etc).       Assessment / Recommendations / Plan   Plan  Continue with current plan of care      Progression Toward Goals   Progression toward goals  Progressing toward goals         SLP Short Term Goals - 12/31/18 1548      SLP SHORT TERM GOAL #1   Title  pt will complete aphasia assessment of auditory comprehension and verbal expression within 1-2 sessions    Status  Achieved      SLP SHORT TERM GOAL #2   Title  pt will demo understanding of more complex commands (spoken) with 80% and occasional min cues over three sessions    Time  3    Period  Weeks    Status  On-going      SLP SHORT TERM GOAL #3   Title  pt will answer pertinent questions with at least 3 words    Time  3    Period  Weeks    Status  On-going      SLP SHORT TERM GOAL #4   Title  pt will demo sustained/selective attention necessary to complete efficacious speech therapy in 6  of the first 8 sessions    Baseline  12-31-18    Time  3    Period  Weeks   or 8 sessions   Status  On-going       SLP Long Term Goals - 12/31/18 1549      SLP LONG TERM GOAL #1   Title  pt will demo understanding of more complex commands (spoken) with 85% and rare min cues over three sessions    Time  7    Period  Weeks   or 17 sessions, for all LTGs   Status  On-going      SLP LONG TERM GOAL #2   Title  pt will answer questions with WNL verbal responses 90% of the time using compensations for anomia PRN, in 3 sessions    Time  7    Period  Weeks    Status  On-going      SLP LONG TERM GOAL #3   Title  pt will sustain loud /a/ with average mid 70s dB over 4 sessions    Time  7    Period  Weeks    Status  On-going      SLP LONG TERM GOAL #4   Title  pt will engage in simple conversation for 5  minutes with average loudness mid 60s dB over 3 sessions    Time  7    Period  Weeks    Status  On-going       Plan - 12/31/18 1547    Clinical Impression Statement  Pt with mod Broca's aphasia, dysarthria/voice, and cognitive communication deficits.Pt with demonstrated decr'd sustained attention as well, and 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 Pt would ocnt to benefit from skilled ST focusing on primarily receptive and expressive language but also at some point in rehab process focus on speech loudness and cognitive communication skills. Depending on pt progress this may occur during pt's next 24 ST visits.    Speech Therapy Frequency  2x / week    Duration  --   12 weeks or 25 total visits   Treatment/Interventions  Environmental controls;Functional tasks;Compensatory techniques;Multimodal communcation approach;SLP instruction and feedback;Cueing hierarchy;Language facilitation;Cognitive reorganization;Internal/external aids;Patient/family education    Potential to Achieve Goals  Good    Potential Considerations  Co-morbidities;Ability to learn/carryover information;Severity of impairments;Other (comment)   unsure of pt's medical prognosis post-rad/chemo   Consulted and Agree with Plan of Care  Patient;Family member/caregiver    Family Member Consulted  father       Patient will benefit from skilled therapeutic intervention in order to improve the following deficits and impairments:   Aphasia  Dysarthria and anarthria  Cognitive communication deficit    Problem List Patient Active Problem List   Diagnosis Date Noted  . Goals of care, counseling/discussion 12/16/2018  . Palliative care by specialist   . 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 Spastic hemiparesis ([place])   . Cerebral edema ([facility])   . Intracranial tumor ([place])   . Steroid-induced hyperglycemia   . Spastic hemiplegia affecting nondominant side ([place])   . Hyponatremia   . Transaminitis   . Leucocytosis    . Right spastic hemiplegia ([place]) 10/21/2018  . Dysphagia 10/21/2018  . 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 Brain mass   . ICH (intracerebral hemorrhage) ([place]) 10/13/2018    [facility] ,MS, [place]  12/31/2018, 3:49 PM  [facility] [address] [place], [place], [address] Phone: [phone]  Fax:  [phone]   Name: Gregory Moreno MRN: [id number] Date of Birth: 1995-04-24

## 2018-12-31 NOTE — Therapy (Signed)
[facility] [address] [place], [place], [address] Phone: [phone]   Fax:  [phone]  Occupational Therapy Treatment  Patient Details  Name: Gregory Moreno MRN: [id number] Date of Birth: 1995/10/17 Referring Provider (OT): Marlowe [place]   Encounter Date: 12/31/2018  OT End of Session - 12/31/18 1540    Visit Number  5    Number of Visits  25    Date for OT Re-Evaluation  03/17/19    OT Start Time  [id number]    OT Stop Time  1530    OT Time Calculation (min)  43 min    Activity Tolerance  Patient tolerated treatment well    Behavior During Therapy  [facility] for tasks assessed/performed       History reviewed. No pertinent past medical history.  Past Surgical History:  Procedure Laterality Date  . CRANIOTOMY Left 10/13/2018   Procedure: LEFT CRANIOTOMY FOR TUMOR RESECTION;  Surgeon: Judith Part, MD;  Location: [facility];  Service: Neurosurgery;  Laterality: Left;    There were no vitals filed for this visit.  Subjective Assessment - 12/31/18 1455    Subjective   "walking"  when asked what is getting better    Patient is accompanied by:  Family member    Currently in Pain?  No/denies    Pain Score  0-No pain                   OT Treatments/Exercises (OP) - 12/31/18 0001      ADLs   LB Dressing  Worked on seated weight shifts to feet.  Active low reach with bilateral UE.  Patient needed consistent cueing to actively use RUE versus passively placing it into position,  Made directr connection to forward flexion to functional taksks (sit to stand, stand to sit)  as well as lower body bating and dressing.        Neurological Re-education Exercises   Other Exercises 1  Initiated two supine with dowel exercises shoulder press and shoulder flexion.  Dad present and able to identify less than desirable movement patterns.  Patient unable to replicate similar movement while holding dowel or ball.                OT Education - 12/31/18 1539    Education Details  Initiated HEP shoulder flexion supine dowel    Person(s) Educated  Patient;Parent(s)    Methods  Explanation;Demonstration;Tactile cues;Verbal cues;Handout    Comprehension  Verbalized understanding;Returned demonstration;Need further instruction       OT Short Term Goals - 12/26/18 1656      OT SHORT TERM GOAL #1   Title  Patient will complete an HEP designed to improve active motion in RUE due 01/16/19    Status  On-going      OT SHORT TERM GOAL #2   Title  Patient will reach forward with shoulder flexion, elbow extension pattern to make contact with target in midline and 8-12' from chest with intermittent assist    Status  On-going      OT SHORT TERM GOAL #3   Title  Patient will transfer from wheelchair to commode with no more than supervision assistance - transferring either toward right or toward left side    Status  On-going      OT SHORT TERM GOAL #4   Title  Patient will grasp and release a cylindrical object 2-3" in diameter with min assist    Status  On-going  OT SHORT TERM GOAL #5   Title  Patient will dress lower body with mod assist    Status  On-going        OT Long Term Goals - 12/17/18 1606      OT LONG TERM GOAL #1   Title  Patient will dress himself with no greater than set up assistance    Time  12    Period  Weeks    Status  New    Target Date  03/17/19      OT LONG TERM GOAL #2   Title  Patient will shower with supervision    Time  12    Period  Weeks    Status  New      OT LONG TERM GOAL #3   Title  Patient will cut food on plate with modified independence    Time  12    Period  Weeks    Status  New      OT LONG TERM GOAL #4   Title  Patient will demonstrate ability to retrieve a lightweight item (less than 2lb) from waist height or lower and transport 6-12 inches laterally with RUE    Time  12    Period  Weeks    Status  New      OT LONG TERM GOAL #5   Title   Patient will demonstrate RUE  mid level reach with minimal LUE support to hit a target at chest height directly in front of body    Time  12    Period  Weeks    Status  New            Plan - 12/31/18 1540    Clinical Impression Statement  Patient showing increased tension in RUE, and demonstrating increased synergistic movement patterns.    OT Frequency  2x / week    OT Duration  12 weeks    OT Treatment/Interventions  Self-care/ADL training;DME and/or AE instruction;Splinting;Balance training;Aquatic Therapy;Therapeutic activities;Therapeutic exercise;Cognitive remediation/compensation;Neuromuscular education;Functional Mobility Training;Visual/perceptual remediation/compensation;Electrical Stimulation;Manual Therapy;Patient/family education    Plan  NMR RUE/TRUNK, control of more than one joint - attempt some functional use (forced use)    Consulted and Agree with Plan of Care  Patient;Family member/caregiver    Family Member Consulted  Dad- [name]       Patient will benefit from skilled therapeutic intervention in order to improve the following deficits and impairments:           Visit Diagnosis: 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 Goals of care, counseling/discussion 12/16/2018  . Palliative care by specialist   . 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 Spastic hemiparesis ([place])   . Cerebral edema ([facility])   . Intracranial tumor ([place])   . Steroid-induced hyperglycemia   . Spastic hemiplegia affecting nondominant side ([place])   . Hyponatremia   . Transaminitis   . Leucocytosis   . Right spastic hemiplegia ([place]) 10/21/2018  . Dysphagia 10/21/2018  . 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 Brain mass   . ICH (intracerebral hemorrhage) ([place]) 10/13/2018    Mariah Milling, OTR/L 12/31/2018, 3:43 PM  [facility] [address] [place], [place],  [id number] Phone: [phone]   Fax:  [phone]  Name: Gregory Moreno MRN: [id number] Date of Birth: [birth year]

## 2018-12-31 NOTE — Patient Instructions (Signed)
Access Code: [email or web address]  URL: [email or web address]  Date: 12/31/2018  Prepared by: Marlowe Sax   Exercises  Supine Shoulder Press with Dowel - 10 reps - 3 sets - 1x daily - 7x weekly  Supine Shoulder Flexion Extension AAROM with Dowel - 10 reps - 3 sets - 1x daily - 7x weekly

## 2018-12-31 NOTE — Therapy (Signed)
[facility] [address] [place], [place], [address] Phone: [phone]   Fax:  [phone]  Physical Therapy Treatment  Patient Details  Name: Gregory Moreno MRN: [id number] Date of Birth: Nov 14, 1995 Referring Provider (PT): Alger Simons, MD   Encounter Date: 12/31/2018  PT End of Session - 12/31/18 1626    Visit Number  5    Number of Visits  25    Authorization Type  [employer] - $40 co-pay, will include all 3 disciplines if seen on the same day.    PT Start Time  1532    PT Stop Time  1616    PT Time Calculation (min)  44 min    Equipment Utilized During Treatment  Gait belt    Activity Tolerance  Patient tolerated treatment well    Behavior During Therapy  WFL for tasks assessed/performed       No past medical history on file.  Past Surgical History:  Procedure Laterality Date  . CRANIOTOMY Left 10/13/2018   Procedure: LEFT CRANIOTOMY FOR TUMOR RESECTION;  Surgeon: Judith Part, MD;  Location: [facility];  Service: Neurosurgery;  Laterality: Left;    There were no vitals filed for this visit.  Subjective Assessment - 12/31/18 1536    Subjective  Reports no changes. No falls.    Patient is accompained by:  Family member   mom, [name].   Limitations  Sitting;Walking;Standing    How long can you walk comfortably?  100' with hemiwalker    Patient Stated Goals  wants to improve his walking, get back to normal    Currently in Pain?  No/denies                12/31/18 1628  Ambulation/Gait  Ambulation/Gait Yes  Ambulation/Gait Assistance 4: Min assist;3: Mod assist  Ambulation/Gait Assistance Details Min A for balance and occasional mod A for RW navigation/steering on R especially around turns. Intermittent standing stops in order to re-set at midline as pt tends to be positioned farther R in RW. Intermittent manual facilitation of weight shifting to the L when pt having difficulty clearing RLE.  Simple verbal cues on when to step and push RW - as pt with tendency to push RW too far anteriorly. W/c follow for safety and needed for seated rest break between bout. Needs verbal and tactile cueing to attend to RUE to remove from R hand orthosis before sitting back down. Initially with step to pattern - after visual demonstration pt able to demonstrate step through (pt does best with visual cues vs. simple command to step big with LLE.   Ambulation Distance (Feet) 45 Feet (x1, 35' x1)  Assistive device Rolling walker;Other (Comment) (R hand orthosis, R AFO)  Gait Pattern Step-to pattern;Decreased stance time - right;Decreased step length - left;Decreased weight shift to right;Decreased hip/knee flexion - right;Right genu recurvatum;Trunk flexed;Narrow base of support;Poor foot clearance - right;Step-through pattern;Decreased dorsiflexion - right  Ambulation Surface Level;Indoor  Neuro Re-ed   Neuro Re-ed Details  Standing in // bars: with RUE and LUE holding onto // bars - noted increased RUE grip onto bars. Use of mirror for visual feedback for midline orientation. Weight shifting towards L and R with therapist using hand as external cue to shift and touch her hand to shift R and L, providing min A at RLE to prevent from buckling - frequent tactile and verbal cues to stand tall on RLE for knee extension. With assist from pt's dad - 28  x 10 soccer ball kicks for R hip flexion. Needed mod A from therapist to bring RLE back to midline and assist with R knee flexion.              Access Code: [email or web address]  URL: [email or web address]  Date: 12/31/2018  Prepared by: Janann August    Initiated supine HEP:  Exercises Supine Bridge - 6-8 reps - 2 sets - 2x daily - 7x weekly -with family member providing assistance to RLE to prevent from abducting when performing - needs initial tactile/simple verbal cue to initiate.  Hooklying Single Leg Bent Knee Fallouts with Resistance - 6-8  reps - 2x daily - 7x weekly - using yellow theraband, therapist using hand for visual cue on where to ABD/ADD RLE. Simple cues to keep LLE stable.         PT Education - 12/31/18 1626    Education Details  initial supine HEP    Person(s) Educated  Patient;Parent(s)    Methods  Explanation;Demonstration;Handout    Comprehension  Verbalized understanding;Returned demonstration       PT Short Term Goals - 12/18/18 1826      PT SHORT TERM GOAL #1   Title  Patient and pt's family will be independent with initial HEP designed to improve movement and strength in RLE.  ALL STGS DUE 01/15/19    Time  4    Period  Weeks    Status  New    Target Date  01/15/19      PT SHORT TERM GOAL #2   Title  Patient will stand at countertop for at least 5 minutes with supervision to increase tolerance for ADLs.    Baseline  4    Period  Weeks    Status  New      PT SHORT TERM GOAL #3   Title  Pt will perform stand pivot vs. squat pivot transfer to either R or L from wc to mat table with supervision in order to decrease caregiver burden.    Time  4    Period  Weeks    Status  New      PT SHORT TERM GOAL #4   Title  Pt will perform all bed mobility with min guard/min A in order to decrease caregiver burden.    Time  4    Period  Weeks    Status  New      PT SHORT TERM GOAL #5   Title  Pt will ambulate at least 100' with hemiwalker, R AFO and min guard in order to improve household mobility.    Time  4    Period  Weeks    Status  New        PT Long Term Goals - 12/18/18 1827      PT LONG TERM GOAL #1   Title  Patient and pt's family will be independent with final HEP designed to improve movement and strength in RLE.  ALL LTGS DUE 03/12/19    Time  12    Period  Weeks    Status  New    Target Date  03/12/19      PT LONG TERM GOAL #2   Title  Pt will ambulate at least 300' with supervision and hemiwalker vs. LRAD in order to improve community mobility.    Time  12    Period  Weeks     Status  New      PT LONG TERM GOAL #3  Title  Pt will perform at least 10 sit <> stands from mat table with proper technique with no cueing and mod I in order to improve functional transfers and demo improved LE strength.    Time  12    Period  Weeks    Status  New      PT LONG TERM GOAL #4   Title  Pt will perform all bed mobility from level mat table with mod I in order to decrease caregiver burden.    Time  12    Period  Weeks    Status  New      PT LONG TERM GOAL #5   Title  Pt will perform 4 steps with step to pattern and single handrail with supervision.    Time  12    Period  Weeks    Status  New            01/02/19 0750  Plan  Clinical Impression Statement Focus of today's skilled session was continued gait training with RW with R AFO and R hand orthosis, initiating supine HEP with pt's parents assistance, and standing weight shifting activities in // bars. Pt demonstrating improved midline orientation in // bars with mirror for visual feedback - continues to need frequent cues for R knee extension in standing. Pt does best with visual cues for step through pattern with gait with RW, needed mod A for assistance with RW steering/navigation on R especially around turns. Will continue to progress towards LTGs.  Personal Factors and Comorbidities  (pt ungergoing chemo/radiation)  Examination-Activity Limitations Bed Mobility;Locomotion Level;Sit;Squat;Stairs;Stand;Transfers  Examination-Participation Restrictions Community Activity;School;Driving  Pt will benefit from skilled therapeutic intervention in order to improve on the following deficits Abnormal gait;Decreased activity tolerance;Decreased balance;Decreased cognition;Decreased coordination;Decreased safety awareness;Decreased range of motion;Decreased mobility;Difficulty walking;Decreased strength;Decreased endurance;Impaired tone  Stability/Clinical Decision Making Evolving/Moderate complexity  Rehab Potential Good   PT Frequency 2x / week  PT Duration 12 weeks  PT Treatment/Interventions ADLs/Self Care Home Management;Therapeutic exercise;Therapeutic activities;Functional mobility training;Stair training;Gait training;DME Instruction;Balance training;Neuromuscular re-education;Patient/family education;Orthotic Fit/Training;Wheelchair mobility training;Energy conservation;Passive range of motion  PT Next Visit Plan continue gait with RW. NMR for weight shifting and stance on RLE. sit <> stands from higher mat table, mini squats. weight shifting toward L and stepping with RLE. 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 Goals of care, counseling/discussion 12/16/2018  . Palliative care by specialist   . Glioblastoma multiforme of temporal lobe ([facility]) 11/11/2018  . Spastic hemiparesis ([facility])   . Cerebral edema ([facility])   . Intracranial tumor ([facility])   . Steroid-induced hyperglycemia   . Spastic hemiplegia affecting nondominant side ([facility])   . Hyponatremia   . Transaminitis   . Leucocytosis   . Right spastic hemiplegia ([facility]) 10/21/2018  . Dysphagia 10/21/2018  . 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 Brain mass   . ICH (intracerebral hemorrhage) ([facility]) 10/13/2018    Jae Skeet Cherlynn Kaiser,  PT, DPT  12/31/2018, 4:29 PM  [facility] [address] [place], [place], [address] Phone: [phone]   Fax:  [phone]  Name: JONH POFFENBERGER MRN: [id number] Date of Birth: 07/28/1995

## 2018-12-31 NOTE — Telephone Encounter (Signed)
Patient left a message asking for refills for lexapro, ritalin, and protonix.

## 2019-01-01 ENCOUNTER — Ambulatory Visit
Admission: RE | Admit: 2019-01-01 | Discharge: 2019-01-01 | Disposition: A | Discharge status: Home / Self Care | Payer: 59 | Source: Ambulatory Visit | Attending: Radiation Oncology | Admitting: Radiation Oncology

## 2019-01-01 ENCOUNTER — Other Ambulatory Visit: Payer: Self-pay

## 2019-01-01 DIAGNOSIS — Z510 Encounter for antineoplastic radiation therapy: Secondary | ICD-10-CM | POA: Diagnosis not present

## 2019-01-01 MED ORDER — PANTOPRAZOLE SODIUM 20 MG PO TBEC: 20.0000 mg | DELAYED_RELEASE_TABLET | Freq: Every day | ORAL | 1 refills | Status: DC

## 2019-01-01 MED ORDER — METHYLPHENIDATE HCL 10 MG PO TABS: 10.0000 mg | ORAL_TABLET | Freq: Two times a day (BID) | ORAL | 0 refills | Status: DC

## 2019-01-01 MED ORDER — ESCITALOPRAM OXALATE 5 MG PO TABS: 5.0000 mg | ORAL_TABLET | Freq: Every day | ORAL | 0 refills | Status: DC

## 2019-01-02 ENCOUNTER — Other Ambulatory Visit: Payer: Self-pay

## 2019-01-02 ENCOUNTER — Ambulatory Visit
Admission: RE | Admit: 2019-01-02 | Discharge: 2019-01-02 | Disposition: A | Discharge status: Home / Self Care | Payer: 59 | Source: Ambulatory Visit | Attending: Radiation Oncology | Admitting: Radiation Oncology

## 2019-01-02 ENCOUNTER — Ambulatory Visit: Payer: 59

## 2019-01-02 DIAGNOSIS — Z510 Encounter for antineoplastic radiation therapy: Secondary | ICD-10-CM | POA: Diagnosis not present

## 2019-01-05 ENCOUNTER — Ambulatory Visit: Payer: 59

## 2019-01-05 ENCOUNTER — Other Ambulatory Visit: Payer: Self-pay

## 2019-01-05 ENCOUNTER — Other Ambulatory Visit: Payer: Self-pay | Admitting: Internal Medicine

## 2019-01-05 ENCOUNTER — Ambulatory Visit: Payer: 59 | Admitting: Occupational Therapy

## 2019-01-05 ENCOUNTER — Ambulatory Visit
Admission: RE | Admit: 2019-01-05 | Discharge: 2019-01-05 | Disposition: A | Discharge status: Home / Self Care | Payer: 59 | Source: Ambulatory Visit | Attending: Radiation Oncology | Admitting: Radiation Oncology

## 2019-01-05 ENCOUNTER — Encounter: Payer: Self-pay | Admitting: Physical Therapy

## 2019-01-05 ENCOUNTER — Encounter: Payer: Self-pay | Admitting: Occupational Therapy

## 2019-01-05 ENCOUNTER — Ambulatory Visit: Payer: 59 | Admitting: Physical Therapy

## 2019-01-05 DIAGNOSIS — R471 Dysarthria and anarthria: Secondary | ICD-10-CM

## 2019-01-05 DIAGNOSIS — R41842 Visuospatial deficit: Secondary | ICD-10-CM

## 2019-01-05 DIAGNOSIS — M6281 Muscle weakness (generalized): Secondary | ICD-10-CM

## 2019-01-05 DIAGNOSIS — R29818 Other symptoms and signs involving the nervous system: Secondary | ICD-10-CM

## 2019-01-05 DIAGNOSIS — R4701 Aphasia: Secondary | ICD-10-CM

## 2019-01-05 DIAGNOSIS — R482 Apraxia: Secondary | ICD-10-CM

## 2019-01-05 DIAGNOSIS — R2681 Unsteadiness on feet: Secondary | ICD-10-CM

## 2019-01-05 DIAGNOSIS — G8191 Hemiplegia, unspecified affecting right dominant side: Secondary | ICD-10-CM | POA: Diagnosis not present

## 2019-01-05 DIAGNOSIS — R41841 Cognitive communication deficit: Secondary | ICD-10-CM

## 2019-01-05 DIAGNOSIS — Z510 Encounter for antineoplastic radiation therapy: Secondary | ICD-10-CM | POA: Diagnosis not present

## 2019-01-05 DIAGNOSIS — C712 Malignant neoplasm of temporal lobe: Secondary | ICD-10-CM

## 2019-01-05 NOTE — Therapy (Signed)
[facility] [address] [place], [place], [address] Phone: [phone]   Fax:  [phone]  Speech Language Pathology Treatment  Patient Details  Name: Gregory Moreno MRN: [id number] Date of Birth: 1995/11/04 Referring Provider (SLP): Alger Simons, MD   Encounter Date: 01/05/2019  End of Session - 01/05/19 1728    Visit Number  6    Number of Visits  25    Date for SLP Re-Evaluation  03/17/19    SLP Start Time  [id number]    SLP Stop Time   [id number]    SLP Time Calculation (min)  40 min    Activity Tolerance  Patient tolerated treatment well       History reviewed. No pertinent past medical history.  Past Surgical History:  Procedure Laterality Date  . CRANIOTOMY Left 10/13/2018   Procedure: LEFT CRANIOTOMY FOR TUMOR RESECTION;  Surgeon: Judith Part, MD;  Location: [facility];  Service: Neurosurgery;  Laterality: Left;    There were no vitals filed for this visit.  Subjective Assessment - 01/05/19 1537    Subjective  "Not much."    Patient is accompained by:  Family member   mom   Currently in Pain?  No/denies            ADULT SLP TREATMENT - 01/05/19 1538      General Information   Behavior/Cognition  Cooperative;Alert   chuckled x2 during session     Treatment Provided   Treatment provided  Cognitive-Linquistic      Cognitive-Linquistic Treatment   Treatment focused on  Cognition;Dysarthria;Patient/family/caregiver education    Skilled Treatment  SLP suggested pt use a 3-ring binder for journal and pt told SLP he would remember it next time. SLP educated pt and mother as to what divisions in this notebook could be and rationale for them. SLP held simple conversation with pt for 6 minutes (sustained attention), with mod-max cues necessary for participation. Pt named "Covid" after mod cues for why people are wearing masks. Pt told SLP his reading was "different now" and agreed when SLP told him due  to the surgery to his brain.      Assessment / Recommendations / Plan   Plan  Continue with current plan of care      Progression Toward Goals   Progression toward goals  Progressing toward goals       SLP Education - 01/05/19 1728    Education Details  rationale for 3 ring binder and divisions (calendar, journal, thearpies)    Person(s) Educated  Patient;Parent(s)    Methods  Explanation    Comprehension  Verbalized understanding;Need further instruction       SLP Short Term Goals - 01/05/19 1730      SLP SHORT TERM GOAL #1   Title  pt will complete aphasia assessment of auditory comprehension and verbal expression within 1-2 sessions    Status  Achieved      SLP SHORT TERM GOAL #2   Title  pt will demo understanding of more complex commands (spoken) with 80% and occasional min cues over three sessions    Time  2    Period  Weeks    Status  On-going      SLP SHORT TERM GOAL #3   Title  pt will answer pertinent questions with at least 3 words    Time  2    Period  Weeks    Status  On-going      SLP  SHORT TERM GOAL #4   Title  pt will demo sustained/selective attention necessary to complete efficacious speech therapy in 6 of the first 8 sessions    Baseline  12-31-18    Time  2    Period  Weeks   or 8 sessions   Status  On-going       SLP Long Term Goals - 01/05/19 1730      SLP LONG TERM GOAL #1   Title  pt will demo understanding of more complex commands (spoken) with 85% and rare min cues over three sessions    Time  6    Period  Weeks   or 17 sessions, for all LTGs   Status  On-going      SLP LONG TERM GOAL #2   Title  pt will answer questions with WNL verbal responses 90% of the time using compensations for anomia PRN, in 3 sessions    Time  6    Period  Weeks    Status  On-going      SLP LONG TERM GOAL #3   Title  pt will sustain loud /a/ with average mid 70s dB over 4 sessions    Time  6    Period  Weeks    Status  On-going      SLP LONG TERM  GOAL #4   Title  pt will engage in simple conversation for 5 minutes with average loudness mid 60s dB over 3 sessions    Time  6    Period  Weeks    Status  On-going       Plan - 01/05/19 1729    Clinical Impression Statement  Pt with mod Broca's aphasia, dysarthria/voice, and cognitive communication deficits.Pt with demonstrated decr'd sustained attention as well, and 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 Pt sustained attention appears to have improved slightly from last wee. Suggested a 3-ring binder today for pt to begin to track appointments, hold a journal, and keep exercises, etc. Pt would ocnt to benefit from skilled ST focusing on primarily receptive and expressive language but also at some point in rehab process focus on speech loudness and cognitive communication skills. Depending on pt progress this may occur during pt's next 24 ST visits.    Speech Therapy Frequency  2x / week    Duration  --   12 weeks or 25 total visits   Treatment/Interventions  Environmental controls;Functional tasks;Compensatory techniques;Multimodal communcation approach;SLP instruction and feedback;Cueing hierarchy;Language facilitation;Cognitive reorganization;Internal/external aids;Patient/family education    Potential to Achieve Goals  Good    Potential Considerations  Co-morbidities;Ability to learn/carryover information;Severity of impairments;Other (comment)   unsure of pt's medical prognosis post-rad/chemo   Consulted and Agree with Plan of Care  Patient;Family member/caregiver    Family Member Consulted  father       Patient will benefit from skilled therapeutic intervention in order to improve the following deficits and impairments:   Cognitive communication deficit  Aphasia  Dysarthria and anarthria    Problem List Patient Active Problem List   Diagnosis Date Noted  . Goals of care, counseling/discussion 12/16/2018  . Palliative care by specialist   . Glioblastoma multiforme of temporal lobe ([facility])  11/11/2018  . Spastic hemiparesis ([facility])   . Cerebral edema ([facility])   . Intracranial tumor ([facility])   . Steroid-induced hyperglycemia   . Spastic hemiplegia affecting nondominant side ([facility])   . Hyponatremia   . Transaminitis   . Leucocytosis   . Right spastic hemiplegia ([facility]) 10/21/2018  .  Dysphagia 10/21/2018  . 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 Brain mass   . ICH (intracerebral hemorrhage) ([facility]) 10/13/2018    [facility], [place]  01/05/2019, 5:31 PM  [facility] [address] [place], [place], [address] Phone: [phone]   Fax:  [phone]   Name: Gregory Moreno MRN: [id number] Date of Birth: March 29, 1995

## 2019-01-05 NOTE — Therapy (Signed)
[facility] [address] [place], [place], [address] Phone: [phone]   Fax:  [phone]  Occupational Therapy Treatment  Patient Details  Name: Gregory Moreno MRN: [id number] Date of Birth: [birth year] Referring Provider (OT): Mar[place]e [place]   Encounter Date: 01/05/2019  OT End of Session - 01/05/19 1713    Visit Number  6    Number of Visits  25    Date for OT Re-Evaluation  03/17/19    OT Start Time  1620    OT Stop Time  1705    OT Time Calculation (min)  45 min    Activity Tolerance  Patient tolerated treatment well    Behavior During Therapy  [facility] for tasks assessed/performed       History reviewed. No pertinent past medical history.  Past Surgical History:  Procedure Laterality Date  . CRANIOTOMY Left 10/13/2018   Procedure: LEFT CRANIOTOMY FOR TUMOR RESECTION;  Surgeon: Judith Part, MD;  Location: [facility];  Service: Neurosurgery;  Laterality: Left;    There were no vitals filed for this visit.                OT Treatments/Exercises (OP) - 01/05/19 0001      Neurological Re-education Exercises   Other Exercises 1  Neuromuscular reeducation to address interlimb coordiantion, and begin to address low reach toward objects.  Patient needs visual feedback as well as overt verbal cueing, does best with hand over hand guidance to demonstrate a more natural reaching pattern.        Splinting   Splinting  spoke with mom about bringing in resting hand splint.  Patient may benefit from a  hand splint during daytime hours.               OT Education - 01/05/19 1712    Education Details  Mom indicates shoulder exercises are going well    Person(s) Educated  Patient;Caregiver(s)    Methods  Explanation;Demonstration    Comprehension  Need further instruction;Verbalized understanding       OT Short Term Goals - 12/26/18 1656      OT SHORT TERM GOAL #1   Title  Patient will complete  an HEP designed to improve active motion in RUE due 01/16/19    Status  On-going      OT SHORT TERM GOAL #2   Title  Patient will reach forward with shoulder flexion, elbow extension pattern to make contact with target in midline and 8-12' from chest with intermittent assist    Status  On-going      OT SHORT TERM GOAL #3   Title  Patient will transfer from wheelchair to commode with no more than supervision assistance - transferring either toward right or toward left side    Status  On-going      OT SHORT TERM GOAL #4   Title  Patient will grasp and release a cylindrical object 2-3" in diameter with min assist    Status  On-going      OT SHORT TERM GOAL #5   Title  Patient will dress lower body with mod assist    Status  On-going        OT Long Term Goals - 12/17/18 1606      OT LONG TERM GOAL #1   Title  Patient will dress himself with no greater than set up assistance    Time  12    Period  Weeks  Status  New    Target Date  03/17/19      OT LONG TERM GOAL #2   Title  Patient will shower with supervision    Time  12    Period  Weeks    Status  New      OT LONG TERM GOAL #3   Title  Patient will cut food on plate with modified independence    Time  12    Period  Weeks    Status  New      OT LONG TERM GOAL #4   Title  Patient will demonstrate ability to retrieve a lightweight item (less than 2lb) from waist height or lower and transport 6-12 inches laterally with RUE    Time  12    Period  Weeks    Status  New      OT LONG TERM GOAL #5   Title  Patient will demonstrate RUE  mid level reach with minimal LUE support to hit a target at chest height directly in front of body    Time  12    Period  Weeks    Status  New            Plan - 01/05/19 1713    Clinical Impression Statement  Patient showing improved functional mobility, and transferring level surface with only set up assist    OT Frequency  2x / week    OT Duration  12 weeks    OT  Treatment/Interventions  Self-care/ADL training;DME and/or AE instruction;Splinting;Balance training;Aquatic Therapy;Therapeutic activities;Therapeutic exercise;Cognitive remediation/compensation;Neuromuscular education;Functional Mobility Training;Visual/perceptual remediation/compensation;Electrical Stimulation;Manual Therapy;Patient/family education    Plan  NMR RUE/TRUNK, control of more than one joint - attempt some functional use (forced use), check splint    Consulted and Agree with Plan of Care  Patient;Family member/caregiver    Family Member Consulted  Mom Grayland Ormond       Patient will benefit from skilled therapeutic intervention in order to improve the following deficits and impairments:           Visit Diagnosis: 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 Goals of care, counseling/discussion 12/16/2018  . Palliative care by specialist   . Glioblastoma multiforme of temporal lobe ([facility]) 11/11/2018  . Spastic hemiparesis ([facility])   . Cerebral edema ([facility])   . Intracranial tumor ([facility])   . Steroid-induced hyperglycemia   . Spastic hemiplegia affecting nondominant side ([facility])   . Hyponatremia   . Transaminitis   . Leucocytosis   . Right spastic hemiplegia ([facility]) 10/21/2018  . Dysphagia 10/21/2018  . 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 Brain mass   . ICH (intracerebral hemorrhage) ([facility]) 10/13/2018    Mariah Milling, OTR/L 01/05/2019, 5:16 PM  [facility] [address] [place], [place], [address] Phone: [phone]   Fax:  [phone]  Name: Gregory Moreno MRN: [id number] Date of Birth: [birth year]

## 2019-01-05 NOTE — Patient Instructions (Signed)
  STart a 3-ring binder to help with your memory and focus  SECTIONS:  Calendar for therapy  Journal  You can think of 2-3 things that happened before lunch (report at lunch), and 2-3 things   that happened after lunch (report at suppertime)  OT  PT  Speech

## 2019-01-05 NOTE — Therapy (Signed)
[facility] [address] [place], [place], [address] Phone: [phone]   Fax:  [phone]  Physical Therapy Treatment  Patient Details  Name: Gregory Moreno MRN: [id number] Date of Birth: [birth year] Referring Provider (PT): Alger Simons, MD   Encounter Date: 01/05/2019  PT End of Session - 01/05/19 1859    Visit Number  6    Number of Visits  25    Authorization Type  [employer] - $40 co-pay, will include all 3 disciplines if seen on the same day.    PT Start Time  1447    PT Stop Time  1531    PT Time Calculation (min)  44 min    Equipment Utilized During Treatment  Gait belt    Activity Tolerance  Patient tolerated treatment well    Behavior During Therapy  WFL for tasks assessed/performed       History reviewed. No pertinent past medical history.  Past Surgical History:  Procedure Laterality Date  . CRANIOTOMY Left 10/13/2018   Procedure: LEFT CRANIOTOMY FOR TUMOR RESECTION;  Surgeon: Judith Part, MD;  Location: [facility];  Service: Neurosurgery;  Laterality: Left;    There were no vitals filed for this visit.  Subjective Assessment - 01/05/19 1452    Subjective  No changes, no falls.    Patient is accompained by:  Family member   dad, Parthiv   Limitations  Sitting;Walking;Standing    How long can you walk comfortably?  100' with hemiwalker    Patient Stated Goals  wants to improve his walking, get back to normal    Currently in Pain?  No/denies                       [facility] Adult PT Treatment/Exercise - 01/05/19 2108      Bed Mobility   Rolling Left  Other (comment)   cues for technique   Left Sidelying to Sit  Minimal Assistance - Patient >75%;Other (comment)   to assist LUE into position, R weight shifting at pelvis   Sit to Supine  Contact Guard/Touching assist      Transfers   Transfers  Sit to Stand;Stand to Sit    Sit to Stand  4: Min guard    Sit to Stand Details  Verbal  cues for sequencing;Verbal cues for technique;Manual facilitation for weight shifting;Manual facilitation for placement;Manual facilitation for weight bearing    Sit to Stand Details (indicate cue type and reason)  multiple reps from mat table standing with no UE support, pt able to demonstrate anterior weight shift, still demonstrating increased hip ADD when standing     Stand to Sit  4: Min assist    Stand to Sit Details (indicate cue type and reason)  Tactile cues for weight shifting;Tactile cues for initiation;Tactile cues for placement;Tactile cues for weight beaing;Visual cues for safe use of DME/AE    Stand to Sit Details  cues to bend forward before sitting down    Comments  Mini squats with LUE support on chair anteriorly 2 x 3 reps, therapist providing min A for balance and rotation of pt's pelvis, with pt having tendency to posterior rotate R pelvis in standing       Ambulation/Gait   Ambulation/Gait  Yes    Ambulation/Gait Assistance  4: Min assist;3: Mod assist    Ambulation/Gait Assistance Details  Min A for balance with RW and w/c follow for safety. Cues to stand tall to better  clear LEs for swing phase. Assistance from OT during gait for assistance for tucking RLE in to help push RW. Need for min/mod A for RW navigation and steering. Pt reliant on UE for pushing RW and gait vs. using LEs.     Ambulation Distance (Feet)  62 Feet    Assistive device  Rolling walker;Other (Comment)   R AFO, R hand orthosis   Gait Pattern  Step-to pattern;Decreased stance time - right;Decreased step length - left;Decreased weight shift to right;Decreased hip/knee flexion - right;Right genu recurvatum;Trunk flexed;Narrow base of support;Poor foot clearance - right;Step-through pattern;Decreased dorsiflexion - right    Ambulation Surface  Level;Indoor               PT Short Term Goals - 12/18/18 1826      PT SHORT TERM GOAL #1   Title  Patient and pt's family will be independent with initial  HEP designed to improve movement and strength in RLE.  ALL STGS DUE 01/15/19    Time  4    Period  Weeks    Status  New    Target Date  01/15/19      PT SHORT TERM GOAL #2   Title  Patient will stand at countertop for at least 5 minutes with supervision to increase tolerance for ADLs.    Baseline  4    Period  Weeks    Status  New      PT SHORT TERM GOAL #3   Title  Pt will perform stand pivot vs. squat pivot transfer to either R or L from wc to mat table with supervision in order to decrease caregiver burden.    Time  4    Period  Weeks    Status  New      PT SHORT TERM GOAL #4   Title  Pt will perform all bed mobility with min guard/min A in order to decrease caregiver burden.    Time  4    Period  Weeks    Status  New      PT SHORT TERM GOAL #5   Title  Pt will ambulate at least 100' with hemiwalker, R AFO and min guard in order to improve household mobility.    Time  4    Period  Weeks    Status  New        PT Long Term Goals - 12/18/18 1827      PT LONG TERM GOAL #1   Title  Patient and pt's family will be independent with final HEP designed to improve movement and strength in RLE.  ALL LTGS DUE 03/12/19    Time  12    Period  Weeks    Status  New    Target Date  03/12/19      PT LONG TERM GOAL #2   Title  Pt will ambulate at least 300' with supervision and hemiwalker vs. LRAD in order to improve community mobility.    Time  12    Period  Weeks    Status  New      PT LONG TERM GOAL #3   Title  Pt will perform at least 10 sit <> stands from mat table with proper technique with no cueing and mod I in order to improve functional transfers and demo improved LE strength.    Time  12    Period  Weeks    Status  New      PT LONG TERM  GOAL #4   Title  Pt will perform all bed mobility from level mat table with mod I in order to decrease caregiver burden.    Time  12    Period  Weeks    Status  New      PT LONG TERM GOAL #5   Title  Pt will perform 4 steps with  step to pattern and single handrail with supervision.    Time  12    Period  Weeks    Status  New            Plan - 01/05/19 2115    Clinical Impression Statement  Focus of today's skilled session focused on gait training with RW, LE strengthening, and functional transfers. Pt demonstrated decreased assitance today to perform bed mobility. Able to perform sit <> stands today without UE support with anterior weight shift before standing. Continues to need verbal/tactile cues in standing to shift towards R. Needed min/mod A for gait training today with RW, pt with heavy tendency to lean towards R and needing mod A for RW navigation/steering. Will continue to progress towards LTGs.    Personal Factors and Comorbidities  --   pt ungergoing chemo/radiation   Examination-Activity Limitations  Bed Mobility;Locomotion Level;Sit;Squat;Stairs;Stand;Transfers    Examination-Participation Restrictions  Community Activity;School;Driving    Stability/Clinical Decision Making  Evolving/Moderate complexity    Rehab Potential  Good    PT Frequency  2x / week    PT Duration  12 weeks    PT Treatment/Interventions  ADLs/Self Care Home Management;Therapeutic exercise;Therapeutic activities;Functional mobility training;Stair training;Gait training;DME Instruction;Balance training;Neuromuscular re-education;Patient/family education;Orthotic Fit/Training;Wheelchair mobility training;Energy conservation;Passive range of motion    PT Next Visit Plan  LE ROM/stretch DFs. continue gait with RW/hemi-walker. can pt get in tall kneeling position? NMR for weight shifting and stance on RLE. sit <> stands from higher mat table, mini squats. weight shifting toward L and stepping with RLE. Initiate HEP when appropriate (try mini squats at counter?)    PT Home Exercise Plan  [id number]    Consulted and Agree with Plan of Care  Patient;Family member/caregiver    Family Member Consulted  dad, Oshua       Patient will benefit  from skilled therapeutic intervention in order to improve the following deficits and impairments:  Abnormal gait, Decreased activity tolerance, Decreased balance, Decreased cognition, Decreased coordination, Decreased safety awareness, Decreased range of motion, Decreased mobility, Difficulty walking, Decreased strength, Decreased endurance, Impaired tone  Visit Diagnosis: 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 Goals of care, counseling/discussion 12/16/2018  . Palliative care by specialist   . 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 Spastic hemiparesis ([facility])   . Cerebral edema ([facility])   . Intracranial tumor ([place])   . Steroid-induced hyperglycemia   . Spastic hemiplegia affecting nondominant side ([facility])   . Hyponatremia   . Transaminitis   . Leucocytosis   . Right spastic hemiplegia ([place]) 10/21/2018  . Dysphagia 10/21/2018  . 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 Brain mass   . ICH (intracerebral hemorrhage) ([place]) 10/13/2018    Arliss Journey, PT, DPT 01/05/2019, 9:26 PM  [facility] [address] [place], [place], [address] Phone: [phone]   Fax:  [phone]  Name: Gregory Moreno MRN: [id number] Date of Birth: 11-Jan-1996

## 2019-01-06 ENCOUNTER — Other Ambulatory Visit: Payer: Self-pay

## 2019-01-06 ENCOUNTER — Ambulatory Visit
Admission: RE | Admit: 2019-01-06 | Discharge: 2019-01-06 | Disposition: A | Discharge status: Home / Self Care | Payer: 59 | Source: Ambulatory Visit | Attending: Radiation Oncology | Admitting: Radiation Oncology

## 2019-01-06 ENCOUNTER — Other Ambulatory Visit: Payer: Self-pay | Admitting: Internal Medicine

## 2019-01-06 DIAGNOSIS — Z510 Encounter for antineoplastic radiation therapy: Secondary | ICD-10-CM | POA: Diagnosis not present

## 2019-01-06 DIAGNOSIS — C712 Malignant neoplasm of temporal lobe: Secondary | ICD-10-CM

## 2019-01-07 ENCOUNTER — Other Ambulatory Visit: Payer: Self-pay

## 2019-01-07 ENCOUNTER — Ambulatory Visit
Admission: RE | Admit: 2019-01-07 | Discharge: 2019-01-07 | Disposition: A | Discharge status: Home / Self Care | Payer: 59 | Source: Ambulatory Visit | Attending: Radiation Oncology | Admitting: Radiation Oncology

## 2019-01-07 ENCOUNTER — Other Ambulatory Visit: Payer: Self-pay | Admitting: Internal Medicine

## 2019-01-07 DIAGNOSIS — C712 Malignant neoplasm of temporal lobe: Secondary | ICD-10-CM

## 2019-01-07 DIAGNOSIS — Z510 Encounter for antineoplastic radiation therapy: Secondary | ICD-10-CM | POA: Diagnosis not present

## 2019-01-08 ENCOUNTER — Ambulatory Visit
Admission: RE | Admit: 2019-01-08 | Discharge: 2019-01-08 | Disposition: A | Discharge status: Home / Self Care | Payer: 59 | Source: Ambulatory Visit | Attending: Radiation Oncology | Admitting: Radiation Oncology

## 2019-01-08 ENCOUNTER — Ambulatory Visit: Payer: 59 | Admitting: Physical Therapy

## 2019-01-08 ENCOUNTER — Encounter: Payer: Self-pay | Admitting: Occupational Therapy

## 2019-01-08 ENCOUNTER — Encounter: Payer: Self-pay | Admitting: Physical Therapy

## 2019-01-08 ENCOUNTER — Other Ambulatory Visit: Payer: Self-pay

## 2019-01-08 ENCOUNTER — Ambulatory Visit: Payer: 59

## 2019-01-08 ENCOUNTER — Ambulatory Visit: Payer: 59 | Admitting: Occupational Therapy

## 2019-01-08 DIAGNOSIS — G8191 Hemiplegia, unspecified affecting right dominant side: Secondary | ICD-10-CM

## 2019-01-08 DIAGNOSIS — M6281 Muscle weakness (generalized): Secondary | ICD-10-CM

## 2019-01-08 DIAGNOSIS — R29818 Other symptoms and signs involving the nervous system: Secondary | ICD-10-CM

## 2019-01-08 DIAGNOSIS — R41842 Visuospatial deficit: Secondary | ICD-10-CM

## 2019-01-08 DIAGNOSIS — R2681 Unsteadiness on feet: Secondary | ICD-10-CM

## 2019-01-08 DIAGNOSIS — Z510 Encounter for antineoplastic radiation therapy: Secondary | ICD-10-CM | POA: Diagnosis not present

## 2019-01-08 DIAGNOSIS — R482 Apraxia: Secondary | ICD-10-CM

## 2019-01-08 NOTE — Therapy (Signed)
[facility] [address] [place], [place], [address] Phone: [phone]   Fax:  [phone]  Physical Therapy Treatment  Patient Details  Name: Gregory Moreno MRN: [id number] Date of Birth: 1995/03/16 Referring Provider (PT): Alger Simons, MD   Encounter Date: 01/08/2019  PT End of Session - 01/08/19 1320    Visit Number  7    Number of Visits  25    Authorization Type  [employer] - $40 co-pay, will include all 3 disciplines if seen on the same day.    PT Start Time  1317    PT Stop Time  1400    PT Time Calculation (min)  43 min    Equipment Utilized During Treatment  Gait belt    Activity Tolerance  Patient tolerated treatment well    Behavior During Therapy  WFL for tasks assessed/performed       History reviewed. No pertinent past medical history.  Past Surgical History:  Procedure Laterality Date  . CRANIOTOMY Left 10/13/2018   Procedure: LEFT CRANIOTOMY FOR TUMOR RESECTION;  Surgeon: Judith Part, MD;  Location: [facility];  Service: Neurosurgery;  Laterality: Left;    There were no vitals filed for this visit.  Subjective Assessment - 01/08/19 1317    Subjective  No falls or concerns. Had OT prior and did lots of walking.    Patient is accompained by:  Family member   dad - Collis   Currently in Pain?  No/denies                       [facility] Adult PT Treatment/Exercise - 01/08/19 1321      Bed Mobility   Bed Mobility  Supine to Sit;Sit to Supine;Rolling Left    Rolling Left  Other (comment)   therapist had pt verbalize how to roll L   Left Sidelying to Sit  Minimal Assistance - Patient >75%   to assist LUE into position    Sit to Supine  Contact Guard/Touching assist      Transfers   Transfers  Sit to Stand;Stand to Sit    Sit to Stand  4: Min guard    Sit to Stand Details  Verbal cues for sequencing;Verbal cues for technique;Manual facilitation for weight shifting;Manual  facilitation for placement;Manual facilitation for weight bearing    Sit to Stand Details (indicate cue type and reason)  cues for wt shifting to L, dec LUE use for push off     Stand to Sit  4: Min assist    Stand to Sit Details (indicate cue type and reason)  Tactile cues for weight shifting;Tactile cues for initiation;Tactile cues for placement;Tactile cues for weight beaing;Visual cues for safe use of DME/AE    Stand to Sit Details  cues for controlled descent to mat table      Ambulation/Gait   Ambulation/Gait  Yes    Ambulation/Gait Assistance  4: Min assist;3: Mod assist    Ambulation/Gait Assistance Details  Therapist assist RW navigation, steering, and to steady. Assist at blocking RLE to prevent recuvatum in terminal stance. Tactile cue at pelvis to wt shift towards LLE in stance phase to allow for RLE step advancement    Ambulation Distance (Feet)  40 Feet   40'x1, 30'x1   Assistive device  Rolling walker;Other (Comment)   R AFO, R hand orthosis    Gait Pattern  Step-to pattern;Decreased stance time - right;Decreased step length - left;Decreased weight shift to  right;Decreased hip/knee flexion - right;Right genu recurvatum;Trunk flexed;Narrow base of support;Poor foot clearance - right;Step-through pattern;Decreased dorsiflexion - right    Ambulation Surface  Level;Indoor      Exercises   Exercises  Other Exercises;Knee/Hip    Other Exercises   Pt performed sustained mini-squat with L reaching for cones at various height and then bending forward to place cone on floor. Squatted down to pick up cone then, wt shift L, and reach to give SPT cone at various heights. Pt performed STS with cone reach L at various angles and heights x2. Attempted STS with reach to high L target and perform R toe tap on 4" box. Pt had great difficulty and fear when attempting. Modified and had pt sustain a high five with SPT to facilitate L wt shift to allow for unweighting RLE > foot advancement on 4"  step  x5. Required assist for R hip flexion and to dec R knee recurvatum in stance. Attempted L step initiation with PT and SPT bilateral HHA and blocking of the R knee. Pt was too fearful to attempt L step. Each exercise focused on wt shifting onto LLE and R hamstring activation during STS and mini-squat.       Knee/Hip Exercises: Seated   Other Seated Knee/Hip Exercises  AROM knee extension and knee flexion x5. Cues for full ROM. Noted R HS weakness.                PT Short Term Goals - 12/18/18 1826      PT SHORT TERM GOAL #1   Title  Patient and pt's family will be independent with initial HEP designed to improve movement and strength in RLE.  ALL STGS DUE 01/15/19    Time  4    Period  Weeks    Status  New    Target Date  01/15/19      PT SHORT TERM GOAL #2   Title  Patient will stand at countertop for at least 5 minutes with supervision to increase tolerance for ADLs.    Baseline  4    Period  Weeks    Status  New      PT SHORT TERM GOAL #3   Title  Pt will perform stand pivot vs. squat pivot transfer to either R or L from wc to mat table with supervision in order to decrease caregiver burden.    Time  4    Period  Weeks    Status  New      PT SHORT TERM GOAL #4   Title  Pt will perform all bed mobility with min guard/min A in order to decrease caregiver burden.    Time  4    Period  Weeks    Status  New      PT SHORT TERM GOAL #5   Title  Pt will ambulate at least 100' with hemiwalker, R AFO and min guard in order to improve household mobility.    Time  4    Period  Weeks    Status  New        PT Long Term Goals - 12/18/18 1827      PT LONG TERM GOAL #1   Title  Patient and pt's family will be independent with final HEP designed to improve movement and strength in RLE.  ALL LTGS DUE 03/12/19    Time  12    Period  Weeks    Status  New    Target  Date  03/12/19      PT LONG TERM GOAL #2   Title  Pt will ambulate at least 300' with supervision and hemiwalker  vs. LRAD in order to improve community mobility.    Time  12    Period  Weeks    Status  New      PT LONG TERM GOAL #3   Title  Pt will perform at least 10 sit <> stands from mat table with proper technique with no cueing and mod I in order to improve functional transfers and demo improved LE strength.    Time  12    Period  Weeks    Status  New      PT LONG TERM GOAL #4   Title  Pt will perform all bed mobility from level mat table with mod I in order to decrease caregiver burden.    Time  12    Period  Weeks    Status  New      PT LONG TERM GOAL #5   Title  Pt will perform 4 steps with step to pattern and single handrail with supervision.    Time  12    Period  Weeks    Status  New            Plan - 01/08/19 1321    Clinical Impression Statement  Today's skilled therapy session focused on wt shifting through LLE and activation of R hip flexor and hamstrings. He requires extensive cueing for wt shifting and needs assist at the R knee to prevent recurvatum and adduction. Pt demonstrated fear with wt shifting activities and had difficulty with STS without UE support however as exercises progressed shifted focus towards reaching components and began to perform STS without use of LUE to push off. Pt can benefit from continued skilled therapy to address deficits and progress towards functional goals.    Personal Factors and Comorbidities  --   pt ungergoing chemo/radiation   Examination-Activity Limitations  Bed Mobility;Locomotion Level;Sit;Squat;Stairs;Stand;Transfers    Examination-Participation Restrictions  Community Activity;School;Driving    Stability/Clinical Decision Making  Evolving/Moderate complexity    Rehab Potential  Good    PT Frequency  2x / week    PT Duration  12 weeks    PT Treatment/Interventions  ADLs/Self Care Home Management;Therapeutic exercise;Therapeutic activities;Functional mobility training;Stair training;Gait training;DME Instruction;Balance  training;Neuromuscular re-education;Patient/family education;Orthotic Fit/Training;Wheelchair mobility training;Energy conservation;Passive range of motion    PT Next Visit Plan  STG due by 12/31.  LE ROM/stretch DFs. continue gait with RW/hemi-walker. can pt get in tall kneeling position? NMR for weight shifting and stance on RLE. sit <> stands from higher mat table, mini squats. weight shifting toward L and stepping with RLE. Initiate HEP when appropriate (try mini squats at counter?) Use mirror for visual cue for wt shifting.    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 Goals of care, counseling/discussion 12/16/2018  . Palliative care by specialist   . 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 Spastic hemiparesis ([place])   . Cerebral edema ([facility])   . Intracranial tumor ([place])   . Steroid-induced hyperglycemia   . Spastic hemiplegia affecting nondominant side ([place])   . Hyponatremia   . Transaminitis   . Leucocytosis   . Right spastic hemiplegia ([place]) 10/21/2018  . Dysphagia 10/21/2018  . 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 Brain mass   . ICH (intracerebral hemorrhage) ([facility])  10/13/2018    Juliann Pulse SPT 01/08/2019, 3:30 PM  [facility] [address] [place], [place], [address] Phone: [phone]   Fax:  [phone]  Name: Gregory Moreno MRN: [id number] Date of Birth: 1995/07/15

## 2019-01-08 NOTE — Telephone Encounter (Signed)
Received call from [facility] requesting refill for patient.

## 2019-01-08 NOTE — Therapy (Signed)
[facility] [address] [place], [place], [address] Phone: [phone]   Fax:  [phone]  Occupational Therapy Treatment  Patient Details  Name: Gregory Moreno MRN: [id number] Date of Birth: 30-Sep-1995 Referring Provider (OT): Marlowe Shores   Encounter Date: 01/08/2019  OT End of Session - 01/08/19 1319    Visit Number  7    Number of Visits  25    Date for OT Re-Evaluation  03/17/19    OT Start Time  1225    OT Stop Time  1310    OT Time Calculation (min)  45 min    Activity Tolerance  Patient tolerated treatment well    Behavior During Therapy  [facility] for tasks assessed/performed       History reviewed. No pertinent past medical history.  Past Surgical History:  Procedure Laterality Date  . CRANIOTOMY Left 10/13/2018   Procedure: LEFT CRANIOTOMY FOR TUMOR RESECTION;  Surgeon: Judith Part, MD;  Location: [facility];  Service: Neurosurgery;  Laterality: Left;    There were no vitals filed for this visit.                OT Treatments/Exercises (OP) - 01/08/19 0001      ADLs   Functional Mobility  Worked on functional ambulation with RW and walker orthosis.  Patient with difficulty controlling balance if concentrating on placing right hand onto walker.  Patient with very delayed response to loss of balance initially.  Worked on standing- static to slightly dynamic, stepping, turning, backing up as needed for bathroom transfers.  Family does not have RW at home, they have regular walker and hemi walker.  Patient apraxic and aphasic, but if given time and visual cues he can motor plan turning, maneuvering around obstacles, etc.        Neurological Re-education Exercises   Other Exercises 1  Neuromuscular reeducation to address reach patterns in RUE.  Used supportive surface for RUE to assist with patterned movement.      Other Exercises 2  UBE x 3 min with RUE only - wrapped on handle to compensate  for decreased grip.                 OT Short Term Goals - 01/08/19 1320      OT SHORT TERM GOAL #1   Title  Patient will complete an HEP designed to improve active motion in RUE due 01/16/19    Status  On-going      OT SHORT TERM GOAL #2   Title  Patient will reach forward with shoulder flexion, elbow extension pattern to make contact with target in midline and 8-12' from chest with intermittent assist    Status  On-going      OT SHORT TERM GOAL #3   Title  Patient will transfer from wheelchair to commode with no more than supervision assistance - transferring either toward right or toward left side    Status  On-going      OT SHORT TERM GOAL #4   Title  Patient will grasp and release a cylindrical object 2-3" in diameter with min assist    Status  Achieved      OT SHORT TERM GOAL #5   Title  Patient will dress lower body with mod assist    Status  On-going        OT Long Term Goals - 12/17/18 1606      OT LONG TERM GOAL #1  Title  Patient will dress himself with no greater than set up assistance    Time  12    Period  Weeks    Status  New    Target Date  03/17/19      OT LONG TERM GOAL #2   Title  Patient will shower with supervision    Time  12    Period  Weeks    Status  New      OT LONG TERM GOAL #3   Title  Patient will cut food on plate with modified independence    Time  12    Period  Weeks    Status  New      OT LONG TERM GOAL #4   Title  Patient will demonstrate ability to retrieve a lightweight item (less than 2lb) from waist height or lower and transport 6-12 inches laterally with RUE    Time  12    Period  Weeks    Status  New      OT LONG TERM GOAL #5   Title  Patient will demonstrate RUE  mid level reach with minimal LUE support to hit a target at chest height directly in front of body    Time  12    Period  Weeks    Status  New            Plan - 01/08/19 1319    Clinical Impression Statement  Patient will finish radiation and  chemo next week.    OT Frequency  2x / week    OT Duration  12 weeks    OT Treatment/Interventions  Self-care/ADL training;DME and/or AE instruction;Splinting;Balance training;Aquatic Therapy;Therapeutic activities;Therapeutic exercise;Cognitive remediation/compensation;Neuromuscular education;Functional Mobility Training;Visual/perceptual remediation/compensation;Electrical Stimulation;Manual Therapy;Patient/family education    Plan  NMR RUE/TRUNK, control of more than one joint - attempt some functional use (forced use), check splint    Consulted and Agree with Plan of Care  Patient;Family member/caregiver    Family Member Consulted  dad- Devlen       Patient will benefit from skilled therapeutic intervention in order to improve the following deficits and impairments:           Visit Diagnosis: 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 Goals of care, counseling/discussion 12/16/2018  . Palliative care by specialist   . Glioblastoma multiforme of temporal lobe ([facility]) 11/11/2018  . Spastic hemiparesis ([facility])   . Cerebral edema (H[facility])   . Intracranial tumor ([place])   . Steroid-induced hyperglycemia   . Spastic hemiplegia affecting nondominant side ([place])   . Hyponatremia   . Transaminitis   . Leucocytosis   . Right spastic hemiplegia ([facility]) 10/21/2018  . Dysphagia 10/21/2018  . 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 Brain mass   . ICH (intracerebral hemorrhage) ([facility]) 10/13/2018    Mariah Milling, OTR/L 01/08/2019, 1:21 PM  [facility] [address] [place], [place], [address] Phone: [phone]   Fax:  [phone]  Name: Gregory Moreno MRN: [id number] Date of Birth: 05-11-1995

## 2019-01-12 ENCOUNTER — Other Ambulatory Visit: Payer: Self-pay

## 2019-01-12 ENCOUNTER — Ambulatory Visit
Admission: RE | Admit: 2019-01-12 | Discharge: 2019-01-12 | Disposition: A | Discharge status: Home / Self Care | Payer: 59 | Source: Ambulatory Visit | Attending: Radiation Oncology | Admitting: Radiation Oncology

## 2019-01-12 DIAGNOSIS — Z510 Encounter for antineoplastic radiation therapy: Secondary | ICD-10-CM | POA: Diagnosis not present

## 2019-01-13 ENCOUNTER — Ambulatory Visit: Payer: 59 | Admitting: Physical Therapy

## 2019-01-13 ENCOUNTER — Inpatient Hospital Stay (HOSPITAL_BASED_OUTPATIENT_CLINIC_OR_DEPARTMENT_OTHER): Payer: 59 | Admitting: Internal Medicine

## 2019-01-13 ENCOUNTER — Encounter: Payer: Self-pay | Admitting: Radiation Oncology

## 2019-01-13 ENCOUNTER — Ambulatory Visit
Admission: RE | Admit: 2019-01-13 | Discharge: 2019-01-13 | Disposition: A | Discharge status: Home / Self Care | Payer: 59 | Source: Ambulatory Visit | Attending: Radiation Oncology | Admitting: Radiation Oncology

## 2019-01-13 ENCOUNTER — Ambulatory Visit: Payer: 59 | Admitting: Occupational Therapy

## 2019-01-13 ENCOUNTER — Ambulatory Visit: Payer: 59

## 2019-01-13 ENCOUNTER — Inpatient Hospital Stay: Payer: 59

## 2019-01-13 ENCOUNTER — Encounter: Payer: Self-pay | Admitting: Occupational Therapy

## 2019-01-13 ENCOUNTER — Other Ambulatory Visit: Payer: Self-pay

## 2019-01-13 VITALS — BP 121/87 | HR 109 | Temp 98.3°F | Resp 20 | Wt 181.7 lb

## 2019-01-13 DIAGNOSIS — R41842 Visuospatial deficit: Secondary | ICD-10-CM

## 2019-01-13 DIAGNOSIS — G8114 Spastic hemiplegia affecting left nondominant side: Secondary | ICD-10-CM

## 2019-01-13 DIAGNOSIS — R4701 Aphasia: Secondary | ICD-10-CM

## 2019-01-13 DIAGNOSIS — R471 Dysarthria and anarthria: Secondary | ICD-10-CM

## 2019-01-13 DIAGNOSIS — G8111 Spastic hemiplegia affecting right dominant side: Secondary | ICD-10-CM

## 2019-01-13 DIAGNOSIS — C712 Malignant neoplasm of temporal lobe: Secondary | ICD-10-CM | POA: Diagnosis not present

## 2019-01-13 DIAGNOSIS — R29818 Other symptoms and signs involving the nervous system: Secondary | ICD-10-CM

## 2019-01-13 DIAGNOSIS — R482 Apraxia: Secondary | ICD-10-CM

## 2019-01-13 DIAGNOSIS — G8191 Hemiplegia, unspecified affecting right dominant side: Secondary | ICD-10-CM

## 2019-01-13 DIAGNOSIS — R41841 Cognitive communication deficit: Secondary | ICD-10-CM

## 2019-01-13 DIAGNOSIS — R2681 Unsteadiness on feet: Secondary | ICD-10-CM

## 2019-01-13 DIAGNOSIS — M6281 Muscle weakness (generalized): Secondary | ICD-10-CM

## 2019-01-13 DIAGNOSIS — Z9181 History of falling: Secondary | ICD-10-CM

## 2019-01-13 DIAGNOSIS — Z510 Encounter for antineoplastic radiation therapy: Secondary | ICD-10-CM | POA: Diagnosis not present

## 2019-01-13 DIAGNOSIS — C719 Malignant neoplasm of brain, unspecified: Secondary | ICD-10-CM

## 2019-01-13 DIAGNOSIS — R1310 Dysphagia, unspecified: Secondary | ICD-10-CM

## 2019-01-13 LAB — CBC WITH DIFFERENTIAL (CANCER CENTER ONLY)
Abs Immature Granulocytes: 0.71 10*3/uL — ABNORMAL HIGH (ref 0.00–0.07)
Basophils Absolute: 0.1 10*3/uL (ref 0.0–0.1)
Basophils Relative: 1 %
Eosinophils Absolute: 0 10*3/uL (ref 0.0–0.5)
Eosinophils Relative: 0 %
HCT: 43.4 % (ref 39.0–52.0)
Hemoglobin: 13.7 g/dL (ref 13.0–17.0)
Immature Granulocytes: 8 %
Lymphocytes Relative: 4 %
Lymphs Abs: 0.4 10*3/uL — ABNORMAL LOW (ref 0.7–4.0)
MCH: 28.4 pg (ref 26.0–34.0)
MCHC: 31.6 g/dL (ref 30.0–36.0)
MCV: 89.9 fL (ref 80.0–100.0)
Monocytes Absolute: 0.5 10*3/uL (ref 0.1–1.0)
Monocytes Relative: 6 %
Neutro Abs: 7.3 10*3/uL (ref 1.7–7.7)
Neutrophils Relative %: 81 %
Platelet Count: 111 10*3/uL — ABNORMAL LOW (ref 150–400)
RBC: 4.83 MIL/uL (ref 4.22–5.81)
RDW: 17.9 % — ABNORMAL HIGH (ref 11.5–15.5)
WBC Count: 9 10*3/uL (ref 4.0–10.5)
nRBC: 0 % (ref 0.0–0.2)

## 2019-01-13 LAB — CMP (CANCER CENTER ONLY)
ALT: 60 U/L — ABNORMAL HIGH (ref 0–44)
AST: 14 U/L — ABNORMAL LOW (ref 15–41)
Albumin: 3.4 g/dL — ABNORMAL LOW (ref 3.5–5.0)
Alkaline Phosphatase: 64 U/L (ref 38–126)
Anion gap: 11 (ref 5–15)
BUN: 13 mg/dL (ref 6–20)
CO2: 25 mmol/L (ref 22–32)
Calcium: 9.1 mg/dL (ref 8.9–10.3)
Chloride: 105 mmol/L (ref 98–111)
Creatinine: 0.76 mg/dL (ref 0.61–1.24)
GFR, Est AFR Am: >60 mL/min (ref >60)
GFR, Estimated: >60 mL/min (ref >60)
Glucose, Bld: 115 mg/dL — ABNORMAL HIGH (ref 70–99)
Potassium: 3.7 mmol/L (ref 3.5–5.1)
Sodium: 141 mmol/L (ref 135–145)
Total Bilirubin: 0.3 mg/dL (ref 0.3–1.2)
Total Protein: 7.1 g/dL (ref 6.5–8.1)

## 2019-01-13 MED ORDER — METHYLPHENIDATE HCL 5 MG PO TABS: ORAL_TABLET | ORAL | 0 refills | Status: DC

## 2019-01-13 NOTE — Progress Notes (Signed)
[facility] [place], [place] [address] [phone]   Interval Evaluation  Date of Service: 01/13/19 Patient Name: Gregory Moreno Patient MRN: [id number] Patient DOB: [birth year] Provider: Ventura Sellers, MD  Identifying Statement:  [name] is a 23 y.o. male with left temporal glioblastoma   Referring Provider: [facility] [address] [place],  [place] [address]  Oncologic History: Oncology History  Glioblastoma multiforme of temporal lobe ([facility])  11/11/2018 Initial Diagnosis   Glioblastoma multiforme of temporal lobe ([facility])   11/25/2018 -  Chemotherapy   The patient had [No matching medication found in this treatment plan]  for chemotherapy treatment.      Biomarkers:  MGMT Unknown.  IDH 1/2 Wild type.  EGFR Unknown  TERT Unknown   Interval History:  DAICHI MORIS presents today for follow up, now having nearly completed week #6 of radiation therapy and concurrent Temodar.  Right side strength is stable or slightly improved.  He continues to ambulate minimally around the home with a supportive belt and a spotter.  Recently he has been able to get in and out of bed on his own.  Remains with dense left visual field impairment.  Language also is slowly improving, he has good understanding but struggles to put sentences together.  No issues with Temodar. Currently on 278m daily decadron.  Medications: Current Outpatient Medications on File Prior to Visit  Medication Sig Dispense Refill  . amantadine (SYMMETREL) 100 MG capsule Take 1 capsule (100 mg total) by mouth 2 (two) times daily with breakfast and lunch. 60 capsule 1  . baclofen (LIORESAL) 10 MG tablet Take 1 tablet (10 mg total) by mouth at bedtime as needed for muscle spasms. 30 each 0  . dexamethasone (DECADRON) 1 MG tablet Take 2 tabs (2978m by mouth daily for one week, then 78m56maily for one week, then STOP 21 tablet 0    . escitalopram (LEXAPRO) 5 MG tablet Take 1 tablet (5 mg total) by mouth at bedtime. 30 tablet 0  . methylphenidate (RITALIN) 10 MG tablet Take 1 tablet (10 mg total) by mouth 2 (two) times daily with breakfast and lunch. 60 tablet 0  . ondansetron (ZOFRAN) 4 MG tablet Take 1 tablet (4 mg total) by mouth every 4 (four) hours as needed for nausea or vomiting. 60 tablet 0  . pantoprazole (PROTONIX) 20 MG tablet Take 1 tablet (20 mg total) by mouth daily. 30 tablet 1  . senna-docusate (SENOKOT-S) 8.6-50 MG tablet Take 1 tablet by mouth 2 (two) times daily.    . aMarland Kitchenetaminophen (TYLENOL) 325 MG tablet Take 2 tablets (650 mg total) by mouth every 4 (four) hours as needed for mild pain (temp > 100.5). (Patient not taking: Reported on 01/13/2019)     No current facility-administered medications on file prior to visit.    Allergies: No Known Allergies Past Medical History: No past medical history on file. Past Surgical History:  Past Surgical History:  Procedure Laterality Date  . CRANIOTOMY Left 10/13/2018   Procedure: LEFT CRANIOTOMY FOR TUMOR RESECTION;  Surgeon: OstJudith PartD;  [facility]  Laterality: Left;   Social History:  Social History   Socioeconomic History  . Marital status: Single    Spouse name: Not on file  . Number of children: Not on file  . Years of education: Not on file  . Highest education level: Not on file  Occupational History  . Not on file  Tobacco Use  . Smoking status: Never Smoker  . Smokeless tobacco: Never Used  Substance and Sexual Activity  . Alcohol use: Not on file  . Drug use: Not on file  . Sexual activity: Not on file  Other Topics Concern  . Not on file  Social History Narrative  . Not on file   Social Determinants of Health   Financial Resource Strain:   . Difficulty of Paying Living Expenses: Not on file  Food Insecurity:   . Worried About Charity fundraiser in the Last Year: Not on file  . Ran Out  of Food in the Last Year: Not on file  Transportation Needs:   . Lack of Transportation (Medical): Not on file  . Lack of Transportation (Non-Medical): Not on file  Physical Activity:   . Days of Exercise per Week: Not on file  . Minutes of Exercise per Session: Not on file  Stress:   . Feeling of Stress : Not on file  Social Connections:   . Frequency of Communication with Friends and Family: Not on file  . Frequency of Social Gatherings with Friends and Family: Not on file  . Attends Religious Services: Not on file  . Active Member of Clubs or Organizations: Not on file  . Attends Archivist Meetings: Not on file  . Marital Status: Not on file  Intimate Partner Violence:   . Fear of Current or Ex-Partner: Not on file  . Emotionally Abused: Not on file  . Physically Abused: Not on file  . Sexually Abused: Not on file   Family History: No family history on file.  Review of Systems: Constitutional: Denies fevers, chills or abnormal weight loss Eyes: Denies blurriness of vision Ears, nose, mouth, throat, and face: Denies mucositis or sore throat Respiratory: Denies cough, dyspnea or wheezes Cardiovascular: Denies palpitation, chest discomfort or lower extremity swelling Gastrointestinal:  Denies nausea, constipation, diarrhea GU: Denies dysuria or incontinence Skin: Denies abnormal skin rashes Neurological: Per HPI Musculoskeletal: Denies joint pain, back or neck discomfort. No decrease in ROM Behavioral/Psych: Denies anxiety, disturbance in thought content, and mood instability  Physical Exam: Vitals:   01/13/19 1101  BP: 121/87  Pulse: (!) 109  Resp: 20  Temp: 98.3 F (36.8 C)  SpO2: 100%   KPS: 60. General: Alert, cooperative, pleasant, in no acute distress Head: Craniotomy scar noted, dry and intact. EENT: No conjunctival injection or scleral icterus. Oral mucosa moist Lungs: Resp effort normal Cardiac: Regular rate and rhythm Abdomen: Soft,  non-distended abdomen Skin: No rashes cyanosis or petechiae. Extremities: No clubbing or edema  Neurologic Exam: Mental Status: Awake, alert, attentive to examiner. Oriented to self and environment. Language is densely impaired with regards to fluency.  Comprehension impaired to multi-step complex commands.  Simple repetition is preserved. Cranial Nerves: Visual acuity is grossly normal. Dense R homonymous hemianopia. Extra-ocular movements intact. No ptosis. Face is symmetric, tongue midline. Motor: Tone and bulk are normal. Power is 3/5 in right arm and leg, 5/5 on left side. Reflexes are symmetric, no pathologic reflexes present. Sensory: Intact to light touch and temperature Gait: Deferred  Labs: I have reviewed the data as listed    Component Value Date/Time   NA 139 12/30/2018 1101   K 3.9 12/30/2018 1101   CL 101 12/30/2018 1101   CO2 25 12/30/2018 1101   GLUCOSE 93 12/30/2018 1101   BUN 18 12/30/2018 1101   CREATININE 0.65 12/30/2018  1101   CALCIUM 8.8 (L) 12/30/2018 1101   PROT 6.7 12/30/2018 1101   ALBUMIN 3.4 (L) 12/30/2018 1101   AST 16 12/30/2018 1101   ALT 76 (H) 12/30/2018 1101   ALKPHOS 61 12/30/2018 1101   BILITOT 0.3 12/30/2018 1101   GFRNONAA >60 12/30/2018 1101   GFRAA >60 12/30/2018 1101   Lab Results  Component Value Date   WBC 9.0 01/13/2019   NEUTROABS 7.3 01/13/2019   HGB 13.7 01/13/2019   HCT 43.4 01/13/2019   MCV 89.9 01/13/2019   PLT 111 (L) 01/13/2019     Assessment/Plan Glioblastoma multiforme of temporal lobe ([facility]) [C71.2]  We appreciate the opportunity to participate in the care of [employer].  He is clinically stable now having completed 5+ weeks of IMRT and concurrent Temodar at 20m/m2 daily.    We are very pleased with his continued improvement through aggressive neuro-directed PT and OT.  Chemotherapy should be held for the following:  ANC less than 1,000  Platelets less than 100,000  LFT or creatinine greater than 2x  ULN  If clinical concerns/contraindications develop  Decadron should be discontinued at this time.  Also recommend decreasing Ritalin to 582mBID x1 week, then 11m58maily, then STOP.  He will follow up again in 3 weeks with post-RT MRI brain for evaluation.  All questions were answered. The patient knows to call the clinic with any problems, questions or concerns. No barriers to learning were detected.  The total time spent in the encounter was 25 minutes and more than 50% was on counseling and review of test results   ZacVentura SellersD Medical Director of Neuro-Oncology [facility] [place]/29/20 11:15 AM

## 2019-01-13 NOTE — Therapy (Signed)
[facility] [address] [place], [place], [address] Phone: [phone]   Fax:  [phone]  Occupational Therapy Treatment  Patient Details  Name: Gregory Moreno MRN: [id number] Date of Birth: 08/04/1995 Referring Provider (OT): Marlowe Shores  OT Progress Note covering dates of service 12/17/18-01/13/19  Encounter Date: 01/13/2019  OT End of Session - 01/13/19 1720    Visit Number  8    Number of Visits  25    Date for OT Re-Evaluation  03/17/19    OT Start Time  1615    OT Stop Time  1700    OT Time Calculation (min)  45 min    Activity Tolerance  Patient tolerated treatment well    Behavior During Therapy  [facility] for tasks assessed/performed       History reviewed. No pertinent past medical history.  Past Surgical History:  Procedure Laterality Date  . CRANIOTOMY Left 10/13/2018   Procedure: LEFT CRANIOTOMY FOR TUMOR RESECTION;  Surgeon: Judith Part, MD;  Location: [facility];  Service: Neurosurgery;  Laterality: Left;    There were no vitals filed for this visit.  Subjective Assessment - 01/13/19 1654    Subjective   I guess so    Patient is accompanied by:  Family member    Currently in Pain?  No/denies    Pain Score  0-No pain                   OT Treatments/Exercises (OP) - 01/13/19 1715      ADLs   LB Dressing  Mom present for session today, and she indicates she is dressing, undressing Gregory Moreno.  Reviewed goal of mod assist for LB dressing.  Patient able to doff shoes, socks, AFO, and pants with no more than min assist.  Patient able to don LB clothing with min assist excluding right shoe, sock and AFO,.      Functional Mobility  Worked on functional gait with emphasis on avoiding obstacles (right visual field) and use of UE for function, e.g. opening a door.        Neurological Re-education Exercises   Other Exercises 1  Neuromuscular reeducation to address reach pattern - shoulder flex  with elbow ext.  Patient with strong synergistic pattern of shoulder abduction with elbow and trunk flexion in attempts to hit targets.        Splinting   Splinting  Mom brought patient's resting hand splint.  Patient no longer has reddened area volar wrist - mom indicates she removed older cover and cleaned and replaced cover - and no longer having pressure area.                 OT Short Term Goals - 01/13/19 1655      OT SHORT TERM GOAL #1   Title  Patient will complete an HEP designed to improve active motion in RUE due 01/16/19    Status  Achieved      OT SHORT TERM GOAL #2   Title  Patient will reach forward with shoulder flexion, elbow extension pattern to make contact with target in midline and 8-12' from chest with intermittent assist    Status  On-going      OT SHORT TERM GOAL #3   Title  Patient will transfer from wheelchair to commode with no more than supervision assistance - transferring either toward right or toward left side    Status  Achieved      OT  SHORT TERM GOAL #4   Title  Patient will grasp and release a cylindrical object 2-3" in diameter with min assist    Status  Achieved      OT SHORT TERM GOAL #5   Title  Patient will dress lower body with mod assist    Status  Achieved        OT Long Term Goals - 12/17/18 1606      OT LONG TERM GOAL #1   Title  Patient will dress himself with no greater than set up assistance    Time  12    Period  Weeks    Status  New    Target Date  03/17/19      OT LONG TERM GOAL #2   Title  Patient will shower with supervision    Time  12    Period  Weeks    Status  New      OT LONG TERM GOAL #3   Title  Patient will cut food on plate with modified independence    Time  12    Period  Weeks    Status  New      OT LONG TERM GOAL #4   Title  Patient will demonstrate ability to retrieve a lightweight item (less than 2lb) from waist height or lower and transport 6-12 inches laterally with RUE    Time  12     Period  Weeks    Status  New      OT LONG TERM GOAL #5   Title  Patient will demonstrate RUE  mid level reach with minimal LUE support to hit a target at chest height directly in front of body    Time  12    Period  Weeks    Status  New            Plan - 01/13/19 1720    Clinical Impression Statement  Patient had last radiation session today, and has reprieve from both Chemo and radiation.  Oncology is working to decrease several medications - namely ritalin and steroids,    OT Frequency  2x / week    OT Duration  12 weeks    OT Treatment/Interventions  Self-care/ADL training;DME and/or AE instruction;Splinting;Balance training;Aquatic Therapy;Therapeutic activities;Therapeutic exercise;Cognitive remediation/compensation;Neuromuscular education;Functional Mobility Training;Visual/perceptual remediation/compensation;Electrical Stimulation;Manual Therapy;Patient/family education    Plan  NMR RUE/TRUNK, control of more than one joint - attempt some functional use (forced use), check splint    Consulted and Agree with Plan of Care  Patient;Family member/caregiver    Family Member Consulted  Mom       Patient will benefit from skilled therapeutic intervention in order to improve the following deficits and impairments:           Visit Diagnosis: 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 Goals of care, counseling/discussion 12/16/2018  . Palliative care by specialist   . Glioblastoma multiforme of temporal lobe ([place]) 11/11/2018  . Spastic hemiparesis ([place])   . Cerebral edema ([facility])   . Intracranial tumor ([place])   . Steroid-induced hyperglycemia   . Spastic hemiplegia affecting nondominant side ([place])    . Hyponatremia   . Transaminitis   . Leucocytosis   . Right spastic hemiplegia ([place])  10/21/2018  . Dysphagia 10/21/2018  . 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 Brain mass   . ICH (intracerebral hemorrhage) ([place]) 10/13/2018    Mariah Milling, OTR/L 01/13/2019, 5:23 PM  [facility] [address] [place], [place], [address] Phone: [phone]   Fax:  [phone]  Name: Gregory Moreno MRN: [id number] Date of Birth: 1995/07/31

## 2019-01-14 ENCOUNTER — Ambulatory Visit: Payer: 59

## 2019-01-14 ENCOUNTER — Encounter: Payer: Self-pay | Admitting: Physical Medicine and Rehabilitation

## 2019-01-14 ENCOUNTER — Other Ambulatory Visit: Payer: Self-pay

## 2019-01-14 ENCOUNTER — Encounter: Payer: 59 | Attending: Physical Medicine and Rehabilitation | Admitting: Physical Medicine and Rehabilitation

## 2019-01-14 ENCOUNTER — Telehealth: Payer: Self-pay | Admitting: Internal Medicine

## 2019-01-14 VITALS — BP 117/80 | HR 110 | Temp 97.8°F | Ht 69.0 in | Wt 180.0 lb

## 2019-01-14 DIAGNOSIS — G8110 Spastic hemiplegia affecting unspecified side: Secondary | ICD-10-CM

## 2019-01-14 DIAGNOSIS — N3944 Nocturnal enuresis: Secondary | ICD-10-CM | POA: Diagnosis not present

## 2019-01-14 DIAGNOSIS — C712 Malignant neoplasm of temporal lobe: Secondary | ICD-10-CM | POA: Insufficient documentation

## 2019-01-14 NOTE — Therapy (Signed)
[facility] [address] [place], [place], [address] Phone: [phone]   Fax:  [phone]  Speech Language Pathology Treatment  Patient Details  Name: Gregory Moreno MRN: [id number] Date of Birth: [birth year] Referring Provider (SLP): Alger Simons, MD   Encounter Date: 01/13/2019  End of Session - 01/14/19 1356    Visit Number  7    Number of Visits  25    Date for SLP Re-Evaluation  03/17/19    SLP Start Time  1450    SLP Stop Time   [id number]    SLP Time Calculation (min)  40 min    Activity Tolerance  Patient tolerated treatment well       History reviewed. No pertinent past medical history.  Past Surgical History:  Procedure Laterality Date  . CRANIOTOMY Left 10/13/2018   Procedure: LEFT CRANIOTOMY FOR TUMOR RESECTION;  Surgeon: Judith Part, MD;  Location: [facility];  Service: Neurosurgery;  Laterality: Left;    There were no vitals filed for this visit.  Subjective Assessment - 01/13/19 1535    Subjective  "Not much."    Patient is accompained by:  Family member   mom   Currently in Pain?  No/denies            ADULT SLP TREATMENT - 01/14/19 0001      General Information   Behavior/Cognition  Alert;Cooperative;Lethargic;Requires cueing      Treatment Provided   Treatment provided  Cognitive-Linquistic      Cognitive-Linquistic Treatment   Treatment focused on  Aphasia    Skilled Treatment  SLP used VNEST to improve pt's expressive langueage (verbal) with consistent mod-max A for less-personal verbs. Pt was most successful with "fly" verb and then req'd usual mod-max A. Pt looked in his notebook for appointment tracking with cues to do so, and req'd mod A to look for today and attend to find the appointment he had next.       Assessment / Recommendations / Plan   Plan  Continue with current plan of care         SLP Short Term Goals - 01/14/19 1357      SLP SHORT TERM GOAL #1   Title  pt will complete aphasia assessment of auditory comprehension and verbal expression within 1-2 sessions    Status  Achieved      SLP SHORT TERM GOAL #2   Title  pt will demo understanding of more complex commands (spoken) with 80% and occasional min cues over three sessions    Time  1    Period  Weeks    Status  On-going      SLP SHORT TERM GOAL #3   Title  pt will answer pertinent questions with at least 3 words    Time  1    Period  Weeks    Status  On-going      SLP SHORT TERM GOAL #4   Title  pt will demo sustained/selective attention necessary to complete efficacious speech therapy in 6 of the first 8 sessions    Baseline  12-31-18, 01-13-19    Time  1    Period  Weeks   or 8 sessions   Status  On-going       SLP Long Term Goals - 01/14/19 1358      SLP LONG TERM GOAL #1   Title  pt will demo understanding of more complex commands (spoken) with 85% and rare min cues  over three sessions    Time  5    Period  Weeks   or 17 sessions, for all LTGs   Status  On-going      SLP LONG TERM GOAL #2   Title  pt will answer questions with WNL verbal responses 90% of the time using compensations for anomia PRN, in 3 sessions    Time  5    Period  Weeks    Status  On-going      SLP LONG TERM GOAL #3   Title  pt will sustain loud /a/ with average mid 70s dB over 4 sessions    Time  5    Period  Weeks    Status  On-going      SLP LONG TERM GOAL #4   Title  pt will engage in simple conversation for 5 minutes with average loudness mid 60s dB over 3 sessions    Time  5    Period  Weeks    Status  On-going       Plan - 01/14/19 1357    Clinical Impression Statement  Pt with mod Broca's aphasia, dysarthria/voice, and cognitive communication deficits.Pt with demonstrated decr'd sustained attention as well, and 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 Pt smiled and laughed once during session today. Pt arrived with 3-ring binder today for pt to begin to track appointments, hold a journal,  and keep exercises, etc. Pt would ocnt to benefit from skilled ST focusing on primarily receptive and expressive language but also at some point in rehab process focus on speech loudness and cognitive communication skills. Depending on pt progress this may occur during pt's next 24 ST visits.    Speech Therapy Frequency  2x / week    Duration  --   12 weeks or 25 total visits   Treatment/Interventions  Environmental controls;Functional tasks;Compensatory techniques;Multimodal communcation approach;SLP instruction and feedback;Cueing hierarchy;Language facilitation;Cognitive reorganization;Internal/external aids;Patient/family education    Potential to Achieve Goals  Good    Potential Considerations  Co-morbidities;Ability to learn/carryover information;Severity of impairments;Other (comment)   unsure of pt's medical prognosis post-rad/chemo   Consulted and Agree with Plan of Care  Patient;Family member/caregiver    Family Member Consulted  father       Patient will benefit from skilled therapeutic intervention in order to improve the following deficits and impairments:   Aphasia  Cognitive communication deficit  Dysarthria and anarthria    Problem List Patient Active Problem List   Diagnosis Date Noted  . Goals of care, counseling/discussion 12/16/2018  . Palliative care by specialist   . Glioblastoma multiforme of temporal lobe ([facility]) 11/11/2018  . Spastic hemiparesis ([facility])   . Cerebral edema ([facility])   . Intracranial tumor ([facility])   . Steroid-induced hyperglycemia   . Spastic hemiplegia affecting nondominant side ([facility])   . Hyponatremia   . Transaminitis   . Leucocytosis   . Right spastic hemiplegia ([facility]) 10/21/2018  . Dysphagia 10/21/2018  . 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 Brain mass   . ICH (intracerebral hemorrhage) ([facility]) 10/13/2018    [facility] ,[place], [place]  01/14/2019, 1:59 PM  [facility] [address] [place], [place], [address] Phone: [phone]   Fax:  [phone]   Name: Gregory Moreno MRN: [id number] Date of Birth: [birth year]

## 2019-01-14 NOTE — Telephone Encounter (Signed)
Scheduled appt per 12/29 los.  Sent a message to HIM pool to get a calendar mailed out. 

## 2019-01-14 NOTE — Therapy (Signed)
[facility] [address] [place], [place], [address] Phone: [phone]   Fax:  [phone]  Physical Therapy Treatment  Patient Details  Name: Gregory Moreno MRN: [id number] Date of Birth: 1995-11-04 Referring Provider (PT): Alger Simons, MD   Encounter Date: 01/13/2019  PT End of Session - 01/14/19 1106    Visit Number  8    Number of Visits  25    Authorization Type  [employer] - $40 co-pay, will include all 3 disciplines if seen on the same day.    PT Start Time  1533    PT Stop Time  1616    PT Time Calculation (min)  43 min    Equipment Utilized During Treatment  Gait belt    Activity Tolerance  Patient tolerated treatment well    Behavior During Therapy  WFL for tasks assessed/performed       No past medical history on file.  Past Surgical History:  Procedure Laterality Date  . CRANIOTOMY Left 10/13/2018   Procedure: LEFT CRANIOTOMY FOR TUMOR RESECTION;  Surgeon: Judith Part, MD;  Location: [facility];  Service: Neurosurgery;  Laterality: Left;    There were no vitals filed for this visit.  Subjective Assessment - 01/13/19 1536    Subjective  No falls. Started walking with the walker at home with use of a band to tie it around the walker. Mom said he was sleeping on his right side over the weekend.    Patient is accompained by:  Family member   dad - Gregory Moreno   Currently in Pain?  No/denies               01/13/19 0001  Bed Mobility  Bed Mobility Supine to Sit;Sit to Supine;Rolling Left  Rolling Left Minimal Assistance - Patient > 75%  Left Sidelying to Sit Minimal Assistance - Patient >75%;Other (comment) (to assist pt pushing through LUE and for balance )  Sit to Supine Contact Guard/Touching assist  Transfers  Transfers Sit to Stand;Stand to Sit  Sit to Stand 4: Min guard  Sit to Stand Details Verbal cues for sequencing;Verbal cues for technique;Manual facilitation for weight  shifting;Manual facilitation for placement;Manual facilitation for weight bearing  Sit to Stand Details (indicate cue type and reason) from mat table and from w/c. pt wit tendency to ADDuct BLEs  Stand to Sit 4: Min assist  Stand to Sit Details (indicate cue type and reason) Tactile cues for weight shifting;Tactile cues for initiation;Tactile cues for placement;Tactile cues for weight beaing;Visual cues for safe use of DME/AE  Stand to Sit Details cues to reach LUE posteriorly before sitting after activities in // bars and gait training, pt with uncontrolled descent when fatigued  Squat Pivot Transfers Other (comment);5: Supervision;4: Min guard (initial cueing for head/hips relationship)  Ambulation/Gait  Ambulation/Gait Yes  Ambulation/Gait Assistance 4: Min assist;3: Mod assist  Ambulation/Gait Assistance Details Gait with RW, R AFO and R hand orthosis: pt's mom following with w/c follow for safety due to pt fatiguing easily. SPT assisting with gait training with RW navigation and steering with therapist providing min/mod A at pt's pelvis for weight shifting and blocking to prevent R genu recurvatum. Noted narrow BOS during gait with pt leaning more towards R in RW - during 2nd lap of gait trialed placing theraband at bottom of walker with visual theraband cues for step placement for wider BOS, pt needing intermittent cues on what color to step to, decreased narrow BOS during 2nd bout.  Cues for "big steps" to increase step length on LLE into step through pattern.   Ambulation Distance (Feet) 55 Feet (x1, 60 x1)  Assistive device Rolling walker;Other (Comment) (R AFO, R hand orthosis)  Gait Pattern Step-to pattern;Decreased stance time - right;Decreased step length - left;Decreased weight shift to right;Decreased hip/knee flexion - right;Right genu recurvatum;Trunk flexed;Narrow base of support;Poor foot clearance - right;Step-through pattern;Decreased dorsiflexion - right  Ambulation Surface  Level;Indoor  Neuro Re-ed   Neuro Re-ed Details  Standing in // bars: assist of SPT for additional set of hands, with pt holding onto rails with BUE support, cues for RUE to hold onto // bars. With facilitation at pelvis and additional assist to prevent R genu recurvatum and block R knee, weight shifting towards R and stepping LLE onto 2" block x5 reps. Use of mirror as visual cueing for upright posture and weight shifting towards R. x5 reps weight shifting towards L with stepping RLE onto 2" step, with SPT facilitating at pelvis and PT helping assist with R hip/knee flexion to step on and off the block. Needed additional tactile cues for weight shifting. 5 reps with pt stepping RLE towards red floor disc with facilitation at pelvis, pt taking a more narrow step, cues for "big kick" to step. Performed at end of session with pt having increased fatigue.   Exercises  Other Exercises  In supine with RLE extended: therapist removing pt's R AFO and performing passive ROM of ankle in DF with sustained holds for a stretch, with focus on increasing ankle DF ROM to decrease genu recurvatum during stance phase of gait. pt initially with significant hypomobility,  educated pt's mom of importance of performing stretch/ROM at least 2 times a day.                      PT Education - 01/14/19 1105    Education Details  performing DF stretch at home at least 2 times a day.    Person(s) Educated  Patient;Spouse    Methods  Explanation;Demonstration    Comprehension  Verbalized understanding       PT Short Term Goals - 01/14/19 1114      PT SHORT TERM GOAL #1   Title  Patient and pt's family will be independent with initial HEP designed to improve movement and strength in RLE.  ALL STGS DUE 01/15/19    Baseline  independent with initial HEP.    Time  4    Period  Weeks    Status  Achieved    Target Date  01/15/19      PT SHORT TERM GOAL #2   Title  Patient will stand at countertop for at  least 5 minutes with supervision to increase tolerance for ADLs.    Baseline  did not have time to formally assess on 01/13/19    Period  Weeks    Status  Deferred      PT SHORT TERM GOAL #3   Title  Pt will perform stand pivot vs. squat pivot transfer to either R or L from wc to mat table with supervision in order to decrease caregiver burden.    Baseline  needs initially cueing for technique, min guard for balance for squat pivot    Time  4    Period  Weeks    Status  Partially Met      PT SHORT TERM GOAL #4   Title  Pt will perform all bed mobility with min  guard/min A in order to decrease caregiver burden.    Baseline  needs min A to assist with rolling and for balance for performing sidelying > seated at edge of bed.    Time  4    Period  Weeks    Status  Achieved      PT SHORT TERM GOAL #5   Title  Pt will ambulate at least 100' with hemiwalker, R AFO and min guard in order to improve household mobility.    Baseline  15' with RW, R AFO and R hand orthosis with min/mod A for balance and RW navigation.    Time  4    Period  Weeks    Status  Not Met      Updated/ongoing STGs:  PT Short Term Goals - 01/14/19 1122      PT SHORT TERM GOAL #1   Title  Patient and pt's family will be independent with ongoing HEP designed to improve movement and strength in RLE.  ALL STGS DUE 02/11/19    Time  4    Period  Weeks    Status  On-going    Target Date  02/11/19      PT SHORT TERM GOAL #2   Title  Patient will stand at countertop for at least 8 minutes with supervision to increase tolerance for ADLs.    Baseline  did not have time to formally assess on 01/13/19    Period  Weeks    Status  On-going      PT SHORT TERM GOAL #3   Title  Pt will perform stand pivot vs. squat pivot transfer to either R or L from wc to mat table with mod I  in order to decrease caregiver burden.    Baseline  needs initially cueing for technique, min guard for balance for squat pivot transfer    Time  4     Period  Weeks    Status  Revised      PT SHORT TERM GOAL #4   Title  Pt will perform all bed mobility with supervision in order to decrease caregiver burden.    Baseline  needs min A to assist with rolling and for balance for performing sidelying > seated at edge of bed.    Time  4    Period  Weeks    Status  Revised      PT SHORT TERM GOAL #5   Title  Pt will ambulate at least 60' with RW, R AFO and min guard in order to improve household mobility.    Baseline  73' with RW, R AFO and R hand orthosis with min/mod A for balance and RW navigation.    Time  4    Period  Weeks    Status  Not Met        PT Long Term Goals - 12/18/18 1827      PT LONG TERM GOAL #1   Title  Patient and pt's family will be independent with final HEP designed to improve movement and strength in RLE.  ALL LTGS DUE 03/12/19    Time  12    Period  Weeks    Status  New    Target Date  03/12/19      PT LONG TERM GOAL #2   Title  Pt will ambulate at least 300' with supervision and hemiwalker vs. LRAD in order to improve community mobility.    Time  12    Period  Weeks    Status  New      PT LONG TERM GOAL #3   Title  Pt will perform at least 10 sit <> stands from mat table with proper technique with no cueing and mod I in order to improve functional transfers and demo improved LE strength.    Time  12    Period  Weeks    Status  New      PT LONG TERM GOAL #4   Title  Pt will perform all bed mobility from level mat table with mod I in order to decrease caregiver burden.    Time  12    Period  Weeks    Status  New      PT LONG TERM GOAL #5   Title  Pt will perform 4 steps with step to pattern and single handrail with supervision.    Time  12    Period  Weeks    Status  New            Plan - 01/14/19 1116    Clinical Impression Statement  Focus of today's skilled session was assessing pt's STGs. Pt has achieved 2 out of 5 STGs in regards to HEP and bed mobility. Pt partially met STG #3  in regards to squat pivot transfer with supervision/min guard. Pt did not met LTG #5 in regards to gait - pt able to ambulate a total of 115' (in 2 different bouts) with min/mod A of 2 (therapist blocking R knee to prevent recurvatum and SPT assisting with RW navigation/steering, as pt tends to heavily push through BUE). Pt demonstrated a less narrow BOS and less leaning to R with theraband visual cue at bottom of walker. Updated STGs as appropriate. Will continue to progress towards goals in order to improve functional mobility and independence.    Personal Factors and Comorbidities  --   pt ungergoing chemo/radiation   Examination-Activity Limitations  Bed Mobility;Locomotion Level;Sit;Squat;Stairs;Stand;Transfers    Examination-Participation Restrictions  Community Activity;School;Driving    Stability/Clinical Decision Making  Evolving/Moderate complexity    Rehab Potential  Good    PT Frequency  2x / week    PT Duration  12 weeks    PT Treatment/Interventions  ADLs/Self Care Home Management;Therapeutic exercise;Therapeutic activities;Functional mobility training;Stair training;Gait training;DME Instruction;Balance training;Neuromuscular re-education;Patient/family education;Orthotic Fit/Training;Wheelchair mobility training;Energy conservation;Passive range of motion    PT Next Visit Plan  continue gait with RW/hemi-walker (its in the breakroom, has R hand orthosis and theraband for step width/length). can pt get in tall kneeling position? NMR for weight shifting and stance on RLE. sit <> stands from higher mat table, mini squats. weight shifting toward L and stepping with RLE.  Use mirror for visual cue for wt shifting. Add to HEP when appropriate.    PT Home Exercise Plan  [id number]    Consulted and Agree with Plan of Care  Patient;Family member/caregiver    Family Member Consulted  dad, Gregory Moreno       Patient will benefit from skilled therapeutic intervention in order to improve the following  deficits and impairments:  Abnormal gait, Decreased activity tolerance, Decreased balance, Decreased cognition, Decreased coordination, Decreased safety awareness, Decreased range of motion, Decreased mobility, Difficulty walking, Decreased strength, Decreased endurance, Impaired tone  Visit Diagnosis: Unsteadiness on feet  Muscle weakness (generalized)  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 Goals of care, counseling/discussion 12/16/2018  . Palliative care by specialist   . Glioblastoma multiforme of temporal lobe ([facility]) 11/11/2018  . Spastic hemiparesis ([facility])   . Cerebral edema ([facility])   . Intracranial tumor ([facility])   . Steroid-induced hyperglycemia   . Spastic hemiplegia affecting nondominant side ([facility])   . Hyponatremia   . Transaminitis   . Leucocytosis   . Right spastic hemiplegia ([facility]) 10/21/2018  . Dysphagia 10/21/2018  . 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 Brain mass   . ICH (intracerebral hemorrhage) ([facility]) 10/13/2018    Arliss Journey, PT, DPT  01/14/2019, 11:21 AM  [facility] [address] [place], [place], [address] Phone: [phone]   Fax:  [phone]  Name: Gregory Moreno MRN: [id number] Date of Birth: 08-14-1995

## 2019-01-14 NOTE — Progress Notes (Signed)
Subjective:    Patient ID: Gregory Moreno, male    DOB: 05-08-1995, 23 y.o.   MRN: [id number]  HPI  Gregory Moreno presents for in-person follow-up for impaired mobility and language following his glioblastoma.   He has bene doing very well. He goes to outpatient PT, OT, and SLP and has made great gains in all three domains. He recently finished his steroids and has had a 20 lb increase in weight from taking these. He has also finished his first course of radiation without any side effects other than fatigue. He is due to start his next course in February. His father states that his mood has been great. He enjoys eating and playing games on his phone. He used to love to fly and his father is not sure when he will be able to return to this. 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 He continues to have urinary frequency but was told this should improve now that he is off steroids.   Pain Inventory Average Pain 0 Pain Right Now 0 My pain is na  In the last 24 hours, has pain interfered with the following? General activity 0 Relation with others 0 Enjoyment of life 0 What TIME of day is your pain at its worst? na Sleep (in general) Good  Pain is worse with: na Pain improves with: na Relief from Meds: na  Mobility use a walker ability to climb steps?  no do you drive?  no use a wheelchair  Function not employed: date last employed .  Neuro/Psych No problems in this area  Prior Studies Any changes since last visit?  no  Physicians involved in your care Any changes since last visit?  no   No family history on file. Social History   Socioeconomic History  . Marital status: Single    Spouse name: Not on file  . Number of children: Not on file  . Years of education: Not on file  . Highest education level: Not on file  Occupational History  . Not on file  Tobacco Use  .  Smoking status: Never Smoker  . Smokeless tobacco: Never Used  Substance and Sexual Activity  . Alcohol use: Not on file  . Drug use: Not on file  . Sexual activity: Not on file  Other Topics Concern  . Not on file  Social History Narrative  . Not on file   Social Determinants of Health   Financial Resource Strain:   . Difficulty of Paying Living Expenses: Not on file  Food Insecurity:   . Worried About Charity fundraiser in the Last Year: Not on file  . Ran Out of Food in the Last Year: Not on file  Transportation Needs:   . Lack of Transportation (Medical): Not on file  . Lack of Transportation (Non-Medical): Not on file  Physical Activity:   . Days of Exercise per Week: Not on file  . Minutes of Exercise per Session: Not on file  Stress:   . Feeling of Stress : Not on file  Social Connections:   . Frequency of Communication with Friends and Family: Not on file  . Frequency of Social Gatherings with Friends and Family: Not on file  . Attends Religious Services: Not on file  . Active Member of Clubs or Organizations: Not on file  . Attends Archivist Meetings: Not on file  .  Marital Status: Not on file   Past Surgical History:  Procedure Laterality Date  . CRANIOTOMY Left 10/13/2018   Procedure: LEFT CRANIOTOMY FOR TUMOR RESECTION;  Surgeon: Judith Part, MD;  Location: [facility];  Service: Neurosurgery;  Laterality: Left;   No past medical history on file. There were no vitals taken for this visit.  Opioid Risk Score:   Fall Risk Score:  `1  Depression screen PHQ 2/9  Depression screen PHQ 2/9 12/09/2018  Decreased Interest 0  Down, Depressed, Hopeless 0  PHQ - 2 Score 0  Altered sleeping 0  Tired, decreased energy 0  Change in appetite 0  Feeling bad or failure about yourself  0  Trouble concentrating 0  Moving slowly or fidgety/restless 0  Suicidal thoughts 0  PHQ-9 Score 0   Review of Systems  Constitutional: Negative.   HENT: Negative.    Eyes: Negative.   Respiratory: Negative.   Cardiovascular: Negative.   Gastrointestinal: Negative.   Endocrine: Negative.   Genitourinary: Negative.   Musculoskeletal: Positive for gait problem.  Skin: Negative.   Allergic/Immunologic: Negative.   Hematological: Negative.   Psychiatric/Behavioral: Negative.   All other systems reviewed and are negative.      Objective:   Physical Exam Gen: no distress, normal appearing HEENT: oral mucosa pink and moist, NCAT Cardio: Reg rate Chest: normal effort, normal rate of breathing Abd: soft, non-distended Ext: no edema Skin: intact Neuro: alert. Follows commands. Coordination slow but otherwise intact on left side. On right side, finger-to-nose impaired due to combination of weakness and impaired coordination.  Musculoskeletal: 5/5 strength on left side, 3/5 in right arm and 0/5 in right leg.  Psych: pleasant, flat affect       Assessment & Plan:  Gregory Moreno is a 23 year old man who presents for follow-up after his hospital admission for glioblastoma.  Incontinence: Should improve since now off steroids.  Continue wearing diapers at night. Minimize water drunk at night time and maximize water drunk during the day time. Ensure use of bathroom before sleeping at night.  Cognitive deficits: Continue SLP to work on memory, attention, neologisms, Continue Ritalin 5mg  daily to help with initiation and attention. Can complete current prescription of amantadine 100mg  BID and stop medication afterward.   Impaired gait and mobility: Continue outpatient PT and OT to work on right sided strength. Once stronger, patient would love to join father on plane trips. Discussed prognosis for improved mobility and language  Discussed risk of caregiver burnout and strategies to avoid. Patient is currently with excellent appetite, sleeping well, and without disturbing side effects from his chemo and radiation. He remains pain free. Can  continue Baclofen if needed for spasms, has not required recently.  Advised that it should be used PRN.   20 minutes of face to face patient care time were spent during this visit. All questions were encouraged and answered. Follow up with me in 3 months.

## 2019-01-15 ENCOUNTER — Encounter: Payer: Self-pay | Admitting: Rehabilitation

## 2019-01-15 ENCOUNTER — Encounter: Payer: Self-pay | Admitting: Occupational Therapy

## 2019-01-15 ENCOUNTER — Ambulatory Visit: Payer: 59 | Admitting: Speech Pathology

## 2019-01-15 ENCOUNTER — Ambulatory Visit: Payer: 59 | Admitting: Occupational Therapy

## 2019-01-15 ENCOUNTER — Ambulatory Visit: Payer: 59 | Admitting: Rehabilitation

## 2019-01-15 DIAGNOSIS — G8191 Hemiplegia, unspecified affecting right dominant side: Secondary | ICD-10-CM

## 2019-01-15 DIAGNOSIS — R41841 Cognitive communication deficit: Secondary | ICD-10-CM

## 2019-01-15 DIAGNOSIS — R2681 Unsteadiness on feet: Secondary | ICD-10-CM

## 2019-01-15 DIAGNOSIS — M6281 Muscle weakness (generalized): Secondary | ICD-10-CM

## 2019-01-15 DIAGNOSIS — R482 Apraxia: Secondary | ICD-10-CM

## 2019-01-15 DIAGNOSIS — R41842 Visuospatial deficit: Secondary | ICD-10-CM

## 2019-01-15 DIAGNOSIS — R471 Dysarthria and anarthria: Secondary | ICD-10-CM

## 2019-01-15 DIAGNOSIS — R2689 Other abnormalities of gait and mobility: Secondary | ICD-10-CM

## 2019-01-15 DIAGNOSIS — R4701 Aphasia: Secondary | ICD-10-CM

## 2019-01-15 DIAGNOSIS — R29818 Other symptoms and signs involving the nervous system: Secondary | ICD-10-CM

## 2019-01-15 NOTE — Therapy (Signed)
[facility] [address] [place], [place], [address] Phone: [phone]   Fax:  [phone]  Occupational Therapy Treatment  Patient Details  Name: Gregory Moreno MRN: [id number] Date of Birth: [birth year] Referring Provider (OT): Marlowe Sh[place]   Encounter Date: 01/15/2019  OT End of Session - 01/15/19 1547    Visit Number  9    Number of Visits  25    Date for OT Re-Evaluation  03/17/19    OT Start Time  [id number]    OT Stop Time  1530    OT Time Calculation (min)  45 min    Activity Tolerance  Patient tolerated treatment well    Behavior During Therapy  [facility] for tasks assessed/performed       History reviewed. No pertinent past medical history.  Past Surgical History:  Procedure Laterality Date  . CRANIOTOMY Left 10/13/2018   Procedure: LEFT CRANIOTOMY FOR TUMOR RESECTION;  Surgeon: Judith Part, MD;  Location: [facility];  Service: Neurosurgery;  Laterality: Left;    There were no vitals filed for this visit.  Subjective Assessment - 01/15/19 1540    Subjective   That worries me - regarding walking without walker    Patient is accompanied by:  Family member    Currently in Pain?  No/denies    Pain Score  0-No pain                   OT Treatments/Exercises (OP) - 01/15/19 0001      Neurological Re-education Exercises   Other Exercises 1  Neuromuscular reeducation to address lower trunk initiated movement - seated on physioball.  Patient initiates most movement from upper trunk, and has very limited isolated hip movement, or trunk rotation.  used mobile surface to force more lower trunk initiated motion with control.  Patient with preferred movment patterns that involve over shifting toward right side, which is often inactive.  Worked toward better midline orientation at base of support with more isolated flex rotation/ ext rotation patterning.  Hopeful to have more stable base of support to allow  patient more freedom of movement in upper extremities.       Other Exercises 2  Worked on guided reaching pattern to help patient with pattern of shoulder flex with elbow extension.  Patient does best with visual (versus verbal) and facilitory guiding.  Worked on forced use somewhat - with hand on flashlight - adding pressure to try to turn on and off switch, or to move light across surface by use of wrist and forearm motion.       Manual Therapy   Manual Therapy  Joint mobilization    Manual therapy comments  Patient with report of pain in right wrist with attempt at long armweight bearing.  Patient with tremendous laxity in wrist joint, and responded well to gentle carpal mobilization and realignment.  Patient may benefit from a wrist splint to help promote a more natural arched position of hand and support carpals from further subluxation.                 OT Short Term Goals - 01/13/19 1655      OT SHORT TERM GOAL #1   Title  Patient will complete an HEP designed to improve active motion in RUE due 01/16/19    Status  Achieved      OT SHORT TERM GOAL #2   Title  Patient will reach forward with shoulder flexion, elbow extension  pattern to make contact with target in midline and 8-12' from chest with intermittent assist    Status  On-going      OT SHORT TERM GOAL #3   Title  Patient will transfer from wheelchair to commode with no more than supervision assistance - transferring either toward right or toward left side    Status  Achieved      OT SHORT TERM GOAL #4   Title  Patient will grasp and release a cylindrical object 2-3" in diameter with min assist    Status  Achieved      OT SHORT TERM GOAL #5   Title  Patient will dress lower body with mod assist    Status  Achieved        OT Long Term Goals - 12/17/18 1606      OT LONG TERM GOAL #1   Title  Patient will dress himself with no greater than set up assistance    Time  12    Period  Weeks    Status  New    Target  Date  03/17/19      OT LONG TERM GOAL #2   Title  Patient will shower with supervision    Time  12    Period  Weeks    Status  New      OT LONG TERM GOAL #3   Title  Patient will cut food on plate with modified independence    Time  12    Period  Weeks    Status  New      OT LONG TERM GOAL #4   Title  Patient will demonstrate ability to retrieve a lightweight item (less than 2lb) from waist height or lower and transport 6-12 inches laterally with RUE    Time  12    Period  Weeks    Status  New      OT LONG TERM GOAL #5   Title  Patient will demonstrate RUE  mid level reach with minimal LUE support to hit a target at chest height directly in front of body    Time  12    Period  Weeks    Status  New            Plan - 01/15/19 1547    Clinical Impression Statement  Patient continues to show improved ability with active movement in RUE, and imparoved functional mobility    OT Frequency  2x / week    OT Duration  12 weeks    OT Treatment/Interventions  Self-care/ADL training;DME and/or AE instruction;Splinting;Balance training;Aquatic Therapy;Therapeutic activities;Therapeutic exercise;Cognitive remediation/compensation;Neuromuscular education;Functional Mobility Training;Visual/perceptual remediation/compensation;Electrical Stimulation;Manual Therapy;Patient/family education    Plan  NMR RUE/TRUNK, control of more than one joint - attempt some functional use (forced use), fabricate wrist splint    Consulted and Agree with Plan of Care  Patient;Family member/caregiver    Family Member Consulted  Dad       Patient will benefit from skilled therapeutic intervention in order to improve the following deficits and impairments:           Visit Diagnosis: 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 Goals of care, counseling/discussion 12/16/2018  . Palliative care by specialist   .  Glioblastoma multiforme of temporal lobe ([facility]) 11/11/2018  . Spastic hemiparesis ([facility])   . Cerebral edema ([facility])   . Intracranial tumor ([facility])   . Steroid-induced hyperglycemia   . Spastic hemiplegia affecting nondominant side ([facility])   . Hyponatremia   . Transaminitis   . Leucocytosis   . 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 Dysphagia 10/21/2018  . 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 Brain mass   . ICH (intracerebral hemorrhage) ([facility]) 10/13/2018    Mariah Milling, OTR/L 01/15/2019, 3:51 PM  [facility] [address] [place], [place], [address] Phone: [phone]   Fax:  [phone]  Name: Gregory Moreno MRN: [id number] Date of Birth: 06/09/1995

## 2019-01-15 NOTE — Therapy (Signed)
[facility] [address] [place], [place], [address] Phone: [phone]   Fax:  [phone]  Speech Language Pathology Treatment  Patient Details  Name: Gregory Moreno MRN: [id number] Date of Birth: Jun 24, 1995 Referring Provider (SLP): Gregory Simons, MD   Encounter Date: 01/15/2019  End of Session - 01/15/19 1649    Visit Number  8    Number of Visits  25    Date for SLP Re-Evaluation  03/17/19    SLP Start Time  [id number]    SLP Stop Time   1620    SLP Time Calculation (min)  47 min    Activity Tolerance  Patient tolerated treatment well       No past medical history on file.  Past Surgical History:  Procedure Laterality Date  . CRANIOTOMY Left 10/13/2018   Procedure: LEFT CRANIOTOMY FOR TUMOR RESECTION;  Surgeon: Gregory Part, MD;  Location: [facility];  Service: Neurosurgery;  Laterality: Left;    There were no vitals filed for this visit.  Subjective Assessment - 01/15/19 1532    Subjective  "[name]"    Patient is accompained by:  Family member   Dad Gregory Moreno   Currently in Pain?  No/denies            ADULT SLP TREATMENT - 01/15/19 1534      General Information   Behavior/Cognition  Alert;Cooperative;Lethargic;Requires cueing      Treatment Provided   Treatment provided  Cognitive-Linquistic      Pain Assessment   Pain Assessment  No/denies pain      Cognitive-Linquistic Treatment   Treatment focused on  Aphasia    Skilled Treatment  SLP targeted pt's verbal expression using VNEST (SLP providing written support). Patient generated simple sentences x3 with the verb "buy" and usual max A from SLP. SLP used pt's personal interests (building trains, patient's [place], airplanes, family [place]) as stimuli for wh- questions and to elicit simple descriptions (color, size, etc). Pt looking at clock frequently during last 10 minutes of session.       Assessment / Recommendations / Plan   Plan  Continue  with current plan of care      Progression Toward Goals   Progression toward goals  Progressing toward goals         SLP Short Term Goals - 01/15/19 1649      SLP SHORT TERM GOAL #1   Title  pt will complete aphasia assessment of auditory comprehension and verbal expression within 1-2 sessions    Status  Achieved      SLP SHORT TERM GOAL #2   Title  pt will demo understanding of more complex commands (spoken) with 80% and occasional min cues over three sessions    Time  1    Period  Weeks    Status  On-going      SLP SHORT TERM GOAL #3   Title  pt will answer pertinent questions with at least 3 words    Time  1    Period  Weeks    Status  On-going      SLP SHORT TERM GOAL #4   Title  pt will demo sustained/selective attention necessary to complete efficacious speech therapy in 6 of the first 8 sessions    Baseline  12-31-18, 01-13-19    Time  1    Period  Weeks   or 8 sessions   Status  On-going       SLP Long  Term Goals - 01/15/19 1650      SLP LONG TERM GOAL #1   Title  pt will demo understanding of more complex commands (spoken) with 85% and rare min cues over three sessions    Time  5    Period  Weeks   or 17 sessions, for all LTGs   Status  On-going      SLP LONG TERM GOAL #2   Title  pt will answer questions with WNL verbal responses 90% of the time using compensations for anomia PRN, in 3 sessions    Time  5    Period  Weeks    Status  On-going      SLP LONG TERM GOAL #3   Title  pt will sustain loud /a/ with average mid 70s dB over 4 sessions    Time  5    Period  Weeks    Status  On-going      SLP LONG TERM GOAL #4   Title  pt will engage in simple conversation for 5 minutes with average loudness mid 60s dB over 3 sessions    Time  5    Period  Weeks    Status  On-going       Plan - 01/15/19 1649    Clinical Impression Statement  Pt with mod Broca's aphasia, dysarthria/voice, and cognitive communication deficits.Pt with demonstrated decr'd  sustained attention as well, and 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 Pt smiled and laughed once during session today. Pt arrived with 3-ring binder today for pt to begin to track appointments, hold a journal, and keep exercises, etc. Pt would ocnt to benefit from skilled ST focusing on primarily receptive and expressive language but also at some point in rehab process focus on speech loudness and cognitive communication skills. Depending on pt progress this may occur during pt's next 24 ST visits.    Speech Therapy Frequency  2x / week    Duration  --   12 weeks or 25 total visits   Treatment/Interventions  Environmental controls;Functional tasks;Compensatory techniques;Multimodal communcation approach;SLP instruction and feedback;Cueing hierarchy;Language facilitation;Cognitive reorganization;Internal/external aids;Patient/family education    Potential to Achieve Goals  Good    Potential Considerations  Co-morbidities;Ability to learn/carryover information;Severity of impairments;Other (comment)   unsure of pt's medical prognosis post-rad/chemo   Consulted and Agree with Plan of Care  Patient;Family member/caregiver    Family Member Consulted  father       Patient will benefit from skilled therapeutic intervention in order to improve the following deficits and impairments:   Aphasia  Cognitive communication deficit  Dysarthria and anarthria    Problem List Patient Active Problem List   Diagnosis Date Noted  . Goals of care, counseling/discussion 12/16/2018  . Palliative care by specialist   . Glioblastoma multiforme of temporal lobe ([facility]) 11/11/2018  . Spastic hemiparesis ([facility])   . Cerebral edema ([facility])   . Intracranial tumor ([facility])   . Steroid-induced hyperglycemia   . Spastic hemiplegia affecting nondominant side ([facility])   . Hyponatremia   . Transaminitis   . Leucocytosis   . Right spastic hemiplegia ([facility]) 10/21/2018  . Dysphagia 10/21/2018  . 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  Brain mass   . ICH (intracerebral hemorrhage) ([facility]) 10/13/2018   Gregory Moreno, [place], [place] 01/15/2019, 4:50 PM  [facility] [address] [place], [place], [address] Phone: [phone]   Fax:  [phone]   Name: Gregory Resides  WHITE Moreno MRN: [id number] Date of Birth: 30-May-1995

## 2019-01-15 NOTE — Therapy (Signed)
[facility] [address] [place] , [place], [address] Phone: [phone]   Fax:  [phone]  Physical Therapy Treatment  Patient Details  Name: Gregory Moreno MRN: [id number] Date of Birth: 11-Mar-1995 Referring Provider (PT): Alger Simons, MD   Encounter Date: 01/15/2019  PT End of Session - 01/15/19 1452    Visit Number  9    Number of Visits  25    Authorization Type  [employer] - $40 co-pay, will include all 3 disciplines if seen on the same day.    PT Start Time  1400    PT Stop Time  1445    PT Time Calculation (min)  45 min    Equipment Utilized During Treatment  Gait belt    Activity Tolerance  Patient tolerated treatment well    Behavior During Therapy  WFL for tasks assessed/performed       History reviewed. No pertinent past medical history.  Past Surgical History:  Procedure Laterality Date  . CRANIOTOMY Left 10/13/2018   Procedure: LEFT CRANIOTOMY FOR TUMOR RESECTION;  Surgeon: Judith Part, MD;  Location: [facility];  Service: Neurosurgery;  Laterality: Left;    There were no vitals filed for this visit.  Subjective Assessment - 01/15/19 1402    Subjective  No falls, no changes since last session.    Limitations  Sitting;Walking;Standing    How long can you walk comfortably?  100' with hemiwalker    Patient Stated Goals  wants to improve his walking, get back to normal    Currently in Pain?  No/denies               TA:  Prior to session performed seated calf stretch with PT performing passive stretch x 1 mins (2 reps).  Continue to encourage family to help with this daily.  Father verbalized understanding.  PT provided pts father with R hand orthosis during session so that they may don to their walker at home.    NMR:  NMR for postural control, R lateral weight shift, R UE/LE weight bearing through tall kneeling, sit<>stand, transitional movements and gait.  Performed stand step transfer  to turn towards mat for tall kneeling exercise.  +2 A but pt demo's marked improvement in ability to control RLE while moving LLE.  PT assisted with placement of RLE onto mat.  Once in tall kneeling, provided facilitation at rib cage and pelvis (with PTs hip to pts hip) to encourage hip protraction.  Pt did very well with cuing to bring hip forward and push down with RUE.  OT assisted with maintaining position of RUE during movement.  Had pt lift LUE and reach upward x several reps.  Utilized [employer] for [employer].  Cues to maintain R lateral weight shift throughout.  Pt verbalized pain in knees, therefore transitioned to L hip.  Donned shoes and transitioned to gait.  Pt initially hesitant to ambulate with PT/OT, however finally agreed.  PT with pts RUE around her shoulders in order to better facilitate forward/upward/lateral movement over RLE.  PT did have to provide max verbal and tactile cues at R thigh for contraction and to "push down" when moving through R stance phase of gait.  OT assisted with intermittent LUE support and axilla to maintain upward motion vs pt tendency to flex forward too much.  Note that because we were able to maintain more upright posture, he had much better knee control.  He only had 2-3 instances of knee  hyperextension throughout gait x 30'.                  PT Education - 01/15/19 1452    Education Details  Provided R hand orthosis during session    Person(s) Educated  Patient;Parent(s)    Methods  Explanation;Demonstration    Comprehension  Verbalized understanding;Returned demonstration       PT Short Term Goals - 01/14/19 1122      PT SHORT TERM GOAL #1   Title  Patient and pt's family will be independent with ongoing HEP designed to improve movement and strength in RLE.  ALL STGS DUE 02/11/19    Time  4    Period  Weeks    Status  On-going    Target Date  02/11/19      PT SHORT TERM GOAL #2   Title  Patient will stand at countertop for at  least 8 minutes with supervision to increase tolerance for ADLs.    Baseline  did not have time to formally assess on 01/13/19    Period  Weeks    Status  On-going      PT SHORT TERM GOAL #3   Title  Pt will perform stand pivot vs. squat pivot transfer to either R or L from wc to mat table with mod I  in order to decrease caregiver burden.    Baseline  needs initially cueing for technique, min guard for balance for squat pivot transfer    Time  4    Period  Weeks    Status  Revised      PT SHORT TERM GOAL #4   Title  Pt will perform all bed mobility with supervision in order to decrease caregiver burden.    Baseline  needs min A to assist with rolling and for balance for performing sidelying > seated at edge of bed.    Time  4    Period  Weeks    Status  Revised      PT SHORT TERM GOAL #5   Title  Pt will ambulate at least 6' with RW, R AFO and min guard in order to improve household mobility.    Baseline  72' with RW, R AFO and R hand orthosis with min/mod A for balance and RW navigation.    Time  4    Period  Weeks    Status  Not Met        PT Long Term Goals - 12/18/18 1827      PT LONG TERM GOAL #1   Title  Patient and pt's family will be independent with final HEP designed to improve movement and strength in RLE.  ALL LTGS DUE 03/12/19    Time  12    Period  Weeks    Status  New    Target Date  03/12/19      PT LONG TERM GOAL #2   Title  Pt will ambulate at least 300' with supervision and hemiwalker vs. LRAD in order to improve community mobility.    Time  12    Period  Weeks    Status  New      PT LONG TERM GOAL #3   Title  Pt will perform at least 10 sit <> stands from mat table with proper technique with no cueing and mod I in order to improve functional transfers and demo improved LE strength.    Time  12    Period  Weeks  Status  New      PT LONG TERM GOAL #4   Title  Pt will perform all bed mobility from level mat table with mod I in order to decrease  caregiver burden.    Time  12    Period  Weeks    Status  New      PT LONG TERM GOAL #5   Title  Pt will perform 4 steps with step to pattern and single handrail with supervision.    Time  12    Period  Weeks    Status  New            Plan - 01/15/19 1453    Clinical Impression Statement  Pt able to improve ability to WB through RLE when moving LLE during transfers and transitional movements today.  Good control of R knee when ambulating with two people as PT/OT could keep pt more upright at chest and rib cage to decrease amount of forward translation causing forceful R knee hyperextension.    Personal Factors and Comorbidities  --   pt ungergoing chemo/radiation   Examination-Activity Limitations  Bed Mobility;Locomotion Level;Sit;Squat;Stairs;Stand;Transfers    Examination-Participation Restrictions  Community Activity;School;Driving    Stability/Clinical Decision Making  Evolving/Moderate complexity    Rehab Potential  Good    PT Frequency  2x / week    PT Duration  12 weeks    PT Treatment/Interventions  ADLs/Self Care Home Management;Therapeutic exercise;Therapeutic activities;Functional mobility training;Stair training;Gait training;DME Instruction;Balance training;Neuromuscular re-education;Patient/family education;Orthotic Fit/Training;Wheelchair mobility training;Energy conservation;Passive range of motion    PT Next Visit Plan  Provided them with hand orthosis and they got wheels for walker, did gait with R "three muskateer style" and HHA on L and he did great! continue gait with RW/hemi-walker (its in the breakroom, has R hand orthosis and theraband for step width/length). can pt get in tall kneeling position-we did this however he had a lot of pain in knees so we couldn't stay there long,  NMR for weight shifting and stance on RLE. sit <> stands from higher mat table, mini squats. weight shifting toward L and stepping with RLE.  Use mirror for visual cue for wt shifting. Add  to HEP when appropriate.    PT Home Exercise Plan  [id number]    Consulted and Agree with Plan of Care  Patient;Family member/caregiver    Family Member Consulted  dad, Gregory Moreno       Patient will benefit from skilled therapeutic intervention in order to improve the following deficits and impairments:  Abnormal gait, Decreased activity tolerance, Decreased balance, Decreased cognition, Decreased coordination, Decreased safety awareness, Decreased range of motion, Decreased mobility, Difficulty walking, Decreased strength, Decreased endurance, Impaired tone  Visit Diagnosis: Unsteadiness on feet  Muscle weakness (generalized)  Other abnormalities of gait and mobility  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)     Problem List Patient Active Problem List   Diagnosis Date Noted  . Goals of care, counseling/discussion 12/16/2018  . Palliative care by specialist   . Glioblastoma multiforme of temporal lobe ([facility]) 11/11/2018  . Spastic hemiparesis ([facility])   . Cerebral edema (HCC)   . Intracranial tumor ([facility])   . Steroid-induced hyperglycemia   . Spastic hemiplegia affecting nondominant side ([facility])   . Hyponatremia   . Transaminitis   . Leucocytosis   . Right spastic hemiplegia ([facility]) 10/21/2018  . Dysphagia 10/21/2018  . 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 Brain mass   .  ICH (intracerebral hemorrhage) ([facility]) 10/13/2018    Cameron Sprang, PT, MPT [facility] [address] [place], [place], [address] Phone: [phone]   Fax:  [phone] 01/15/19, 3:06 PM  Name: Gregory Moreno MRN: [id number] Date of Birth: Jul 20, 1995

## 2019-01-19 ENCOUNTER — Ambulatory Visit: Payer: 59

## 2019-01-19 ENCOUNTER — Ambulatory Visit: Payer: 59 | Admitting: Physical Therapy

## 2019-01-19 ENCOUNTER — Encounter: Payer: 59 | Admitting: Occupational Therapy

## 2019-01-19 ENCOUNTER — Other Ambulatory Visit: Payer: Self-pay | Admitting: *Deleted

## 2019-01-19 DIAGNOSIS — C712 Malignant neoplasm of temporal lobe: Secondary | ICD-10-CM

## 2019-01-19 NOTE — Therapy (Signed)
[facility] [address] [place], [place], [address] Phone: [phone]   Fax:  [phone]  Occupational Therapy Treatment  Patient Details  Name: Gregory Moreno MRN: [id number] Date of Birth: 27-Jan-1995 Referring Provider (OT): Marlowe Sh[place]   Encounter Date: 01/15/2019    History reviewed. No pertinent past medical history.  Past Surgical History:  Procedure Laterality Date  . CRANIOTOMY Left 10/13/2018   Procedure: LEFT CRANIOTOMY FOR TUMOR RESECTION;  Surgeon: Judith Part, MD;  Location: [facility];  Service: Neurosurgery;  Laterality: Left;    There were no vitals filed for this visit.                          OT Short Term Goals - 01/13/19 1655      OT SHORT TERM GOAL #1   Title  Patient will complete an HEP designed to improve active motion in RUE due 01/16/19    Status  Achieved      OT SHORT TERM GOAL #2   Title  Patient will reach forward with shoulder flexion, elbow extension pattern to make contact with target in midline and 8-12' from chest with intermittent assist    Status  On-going      OT SHORT TERM GOAL #3   Title  Patient will transfer from wheelchair to commode with no more than supervision assistance - transferring either toward right or toward left side    Status  Achieved      OT SHORT TERM GOAL #4   Title  Patient will grasp and release a cylindrical object 2-3" in diameter with min assist    Status  Achieved      OT SHORT TERM GOAL #5   Title  Patient will dress lower body with mod assist    Status  Achieved        OT Long Term Goals - 12/17/18 1606      OT LONG TERM GOAL #1   Title  Patient will dress himself with no greater than set up assistance    Time  12    Period  Weeks    Status  New    Target Date  03/17/19      OT LONG TERM GOAL #2   Title  Patient will shower with supervision    Time  12    Period  Weeks    Status  New      OT  LONG TERM GOAL #3   Title  Patient will cut food on plate with modified independence    Time  12    Period  Weeks    Status  New      OT LONG TERM GOAL #4   Title  Patient will demonstrate ability to retrieve a lightweight item (less than 2lb) from waist height or lower and transport 6-12 inches laterally with RUE    Time  12    Period  Weeks    Status  New      OT LONG TERM GOAL #5   Title  Patient will demonstrate RUE  mid level reach with minimal LUE support to hit a target at chest height directly in front of body    Time  12    Period  Weeks    Status  New              Patient will benefit from skilled therapeutic intervention in order to improve  the following deficits and impairments:           Visit Diagnosis: 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 Goals of care, counseling/discussion 12/16/2018  . Palliative care by specialist   . Glioblastoma multiforme of temporal lobe ([facility]) 11/11/2018  . Spastic hemiparesis ([facility])   . Cerebral edema (HCC)   . Intracranial tumor ([facility])   . Steroid-induced hyperglycemia   . Spastic hemiplegia affecting nondominant side ([facility])   . Hyponatremia   . Transaminitis   . Leucocytosis   . Right spastic hemiplegia ([facility]) 10/21/2018  . Dysphagia 10/21/2018  . 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 Brain mass   . ICH (intracerebral hemorrhage) ([facility]) 10/13/2018    Mariah Milling 01/19/2019, 12:47 PM  [facility] [address] [place], [place], [address] Phone: [phone]   Fax:  [phone]  Name: Gregory Moreno MRN: [id number] Date of Birth: [birth year]

## 2019-01-20 ENCOUNTER — Ambulatory Visit: Payer: 59

## 2019-01-20 ENCOUNTER — Encounter: Payer: Self-pay | Admitting: Occupational Therapy

## 2019-01-20 ENCOUNTER — Other Ambulatory Visit: Payer: Self-pay | Admitting: Radiation Therapy

## 2019-01-20 ENCOUNTER — Ambulatory Visit: Payer: 59 | Admitting: Occupational Therapy

## 2019-01-20 ENCOUNTER — Other Ambulatory Visit: Payer: Self-pay

## 2019-01-20 ENCOUNTER — Ambulatory Visit: Payer: 59 | Attending: Gastroenterology | Admitting: Physical Therapy

## 2019-01-20 DIAGNOSIS — R29818 Other symptoms and signs involving the nervous system: Secondary | ICD-10-CM

## 2019-01-20 DIAGNOSIS — M6281 Muscle weakness (generalized): Secondary | ICD-10-CM | POA: Diagnosis not present

## 2019-01-20 DIAGNOSIS — R482 Apraxia: Secondary | ICD-10-CM

## 2019-01-20 DIAGNOSIS — R2681 Unsteadiness on feet: Secondary | ICD-10-CM

## 2019-01-20 DIAGNOSIS — R471 Dysarthria and anarthria: Secondary | ICD-10-CM | POA: Insufficient documentation

## 2019-01-20 DIAGNOSIS — R41841 Cognitive communication deficit: Secondary | ICD-10-CM | POA: Diagnosis present

## 2019-01-20 DIAGNOSIS — G8191 Hemiplegia, unspecified affecting right dominant side: Secondary | ICD-10-CM | POA: Diagnosis present

## 2019-01-20 DIAGNOSIS — R41842 Visuospatial deficit: Secondary | ICD-10-CM | POA: Insufficient documentation

## 2019-01-20 DIAGNOSIS — R4701 Aphasia: Secondary | ICD-10-CM | POA: Diagnosis present

## 2019-01-20 NOTE — Therapy (Signed)
[facility] [address] [place], [place], [address] Phone: [phone]   Fax:  [phone]  Physical Therapy Treatment  Patient Details  Name: Gregory Moreno MRN: [id number] Date of Birth: [birth year] Referring Provider (PT): Alger Simons, MD   Encounter Date: 01/20/2019  PT End of Session - 01/20/19 2125    Visit Number  10    Number of Visits  25    Authorization Type  [employer] - $40 co-pay, will include all 3 disciplines if seen on the same day.    PT Start Time  1702    PT Stop Time  1746    PT Time Calculation (min)  44 min    Equipment Utilized During Treatment  Gait belt    Activity Tolerance  Patient tolerated treatment well    Behavior During Therapy  WFL for tasks assessed/performed       No past medical history on file.  Past Surgical History:  Procedure Laterality Date  . CRANIOTOMY Left 10/13/2018   Procedure: LEFT CRANIOTOMY FOR TUMOR RESECTION;  Surgeon: Judith Part, MD;  Location: [facility];  Service: Neurosurgery;  Laterality: Left;    There were no vitals filed for this visit.  Subjective Assessment - 01/20/19 1740    Limitations  Sitting;Walking;Standing    How long can you walk comfortably?  100' with hemiwalker    Patient Stated Goals  wants to improve his walking, get back to normal                       [facility] Adult PT Treatment/Exercise - 01/21/19 0001      Ambulation/Gait   Ambulation/Gait  Yes    Ambulation/Gait Assistance  4: Min assist;3: Mod assist    Ambulation/Gait Assistance Details  Ambulated 115' with RW with R AFO and R hand orthosis, initially with min A from therapist for weight shifting manual facilitation and pelvis and to decrease R genu recurvatum during gait. Halfway through bout, additional assistance of OT to help prevent pt from pushing RW too far anteriorly. Cues to stand tall throughout gait and "push down" through RLE during stance phase. Pt  demonstrating improved knee control and less genu recurvatum during remainder of gait bout with more upright posture when RW held in proper positioning. Manual facilitation for tall posture on R at pelvis and pt's trunk.  Additional 2 x 40' bouts with no AD and mod A of 2 with PT and OT - PT on stool to facilitate weight shift at pelvis on R and for knee control/foot placement, and OT at pt's L trunk with assist with LUE for more upright posture. Mirror used anteriorly as verbal cueing for posture. Verbal and manual cues to stand tall in order to facilitate more even step length and better knee control. Needed assistance at times for foot placement as pt with tendency to have a more narrow BOS. W/c follow for safety. Prolonged seated rest break after each bout of gait.     Assistive device  Rolling walker;Other (Comment)   R hand orthosis, R AFO, plus mod A of 2 no AD   Gait Pattern  Decreased stance time - right;Decreased step length - left;Decreased weight shift to right;Decreased hip/knee flexion - right;Right genu recurvatum;Trunk flexed;Narrow base of support;Poor foot clearance - right;Step-through pattern;Decreased dorsiflexion - right    Ambulation Surface  Level;Indoor               PT Short Term  Goals - 01/14/19 1122      PT SHORT TERM GOAL #1   Title  Patient and pt's family will be independent with ongoing HEP designed to improve movement and strength in RLE.  ALL STGS DUE 02/11/19    Time  4    Period  Weeks    Status  On-going    Target Date  02/11/19      PT SHORT TERM GOAL #2   Title  Patient will stand at countertop for at least 8 minutes with supervision to increase tolerance for ADLs.    Baseline  did not have time to formally assess on 01/13/19    Period  Weeks    Status  On-going      PT SHORT TERM GOAL #3   Title  Pt will perform stand pivot vs. squat pivot transfer to either R or L from wc to mat table with mod I  in order to decrease caregiver burden.     Baseline  needs initially cueing for technique, min guard for balance for squat pivot transfer    Time  4    Period  Weeks    Status  Revised      PT SHORT TERM GOAL #4   Title  Pt will perform all bed mobility with supervision in order to decrease caregiver burden.    Baseline  needs min A to assist with rolling and for balance for performing sidelying > seated at edge of bed.    Time  4    Period  Weeks    Status  Revised      PT SHORT TERM GOAL #5   Title  Pt will ambulate at least 76' with RW, R AFO and min guard in order to improve household mobility.    Baseline  66' with RW, R AFO and R hand orthosis with min/mod A for balance and RW navigation.    Time  4    Period  Weeks    Status  Not Met        PT Long Term Goals - 12/18/18 1827      PT LONG TERM GOAL #1   Title  Patient and pt's family will be independent with final HEP designed to improve movement and strength in RLE.  ALL LTGS DUE 03/12/19    Time  12    Period  Weeks    Status  New    Target Date  03/12/19      PT LONG TERM GOAL #2   Title  Pt will ambulate at least 300' with supervision and hemiwalker vs. LRAD in order to improve community mobility.    Time  12    Period  Weeks    Status  New      PT LONG TERM GOAL #3   Title  Pt will perform at least 10 sit <> stands from mat table with proper technique with no cueing and mod I in order to improve functional transfers and demo improved LE strength.    Time  12    Period  Weeks    Status  New      PT LONG TERM GOAL #4   Title  Pt will perform all bed mobility from level mat table with mod I in order to decrease caregiver burden.    Time  12    Period  Weeks    Status  New      PT LONG TERM GOAL #5  Title  Pt will perform 4 steps with step to pattern and single handrail with supervision.    Time  12    Period  Weeks    Status  New            Plan - 01/21/19 3491    Clinical Impression Statement  Focus of today's skilled session was gait  training with additional help of OT. Pt able to demonstrate improved knee control today with less R genu recurvatum with manual facilitation for more upright posture during gait and with assist to keep RW closer to body vs. pushed too far forward anteriorly. Seated rest breaks between each bout of gait. Pt is progressing well - will continue to progress towards LTGs.    Personal Factors and Comorbidities  --   pt ungergoing chemo/radiation   Examination-Activity Limitations  Bed Mobility;Locomotion Level;Sit;Squat;Stairs;Stand;Transfers    Examination-Participation Restrictions  Community Activity;School;Driving    Stability/Clinical Decision Making  Evolving/Moderate complexity    Rehab Potential  Good    PT Frequency  2x / week    PT Duration  12 weeks    PT Treatment/Interventions  ADLs/Self Care Home Management;Therapeutic exercise;Therapeutic activities;Functional mobility training;Stair training;Gait training;DME Instruction;Balance training;Neuromuscular re-education;Patient/family education;Orthotic Fit/Training;Wheelchair mobility training;Energy conservation;Passive range of motion    PT Next Visit Plan  continue gait with RW. can pt get in tall kneeling position-we did this however he had a lot of pain in knees so we couldn't stay there long,  NMR for weight shifting and stance on RLE. sit <> stands from higher mat table, mini squats. weight shifting toward L and stepping with RLE.  Use mirror for visual cue for wt shifting. Add to HEP when appropriate.    PT Home Exercise Plan  [id number]    Consulted and Agree with Plan of Care  Patient;Family member/caregiver    Family Member Consulted  dad, Dai       Patient will benefit from skilled therapeutic intervention in order to improve the following deficits and impairments:  Abnormal gait, Decreased activity tolerance, Decreased balance, Decreased cognition, Decreased coordination, Decreased safety awareness, Decreased range of motion,  Decreased mobility, Difficulty walking, Decreased strength, Decreased endurance, Impaired tone  Visit Diagnosis: Muscle weakness (generalized)  Unsteadiness on feet  Other symptoms and signs involving the nervous system     Problem List Patient Active Problem List   Diagnosis Date Noted  . Goals of care, counseling/discussion 12/16/2018  . Palliative care by specialist   . Glioblastoma multiforme of temporal lobe ([facility]) 11/11/2018  . Spastic hemiparesis ([facility])   . Cerebral edema (HCC)   . Intracranial tumor ([place])   . Steroid-induced hyperglycemia   . Spastic hemiplegia affecting nondominant side ([place] [place])   . Hyponatremia   . Transaminitis   . Leucocytosis   . Right spastic hemiplegia ([facility]) 10/21/2018  . Dysphagia 10/21/2018  . 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 Brain mass   . ICH (intracerebral hemorrhage) ([facility]) 10/13/2018    Arliss Journey, PT, DPT  01/21/2019, 8:34 AM  [facility] [address] [place], [place], [address] Phone: [phone]   Fax:  [phone]  Name: Gregory Moreno MRN: [id number] Date of Birth: 09/23/1995

## 2019-01-20 NOTE — Therapy (Signed)
[facility] [address] [place], [place], [address] Phone: [phone]   Fax:  [phone]  Speech Language Pathology Treatment  Patient Details  Name: Gregory Moreno MRN: [id number] Date of Birth: 1995/10/28 Referring Provider (SLP): Alger Simons, MD   Encounter Date: 01/20/2019  End of Session - 01/20/19 1646    Visit Number  9    Number of Visits  25    Date for SLP Re-Evaluation  03/17/19    SLP Start Time  5520    SLP Stop Time   8022    SLP Time Calculation (min)  42 min    Activity Tolerance  Patient tolerated treatment well       History reviewed. No pertinent past medical history.  Past Surgical History:  Procedure Laterality Date  . CRANIOTOMY Left 10/13/2018   Procedure: LEFT CRANIOTOMY FOR TUMOR RESECTION;  Surgeon: Judith Part, MD;  Location: [facility];  Service: Neurosurgery;  Laterality: Left;    There were no vitals filed for this visit.  Subjective Assessment - 01/20/19 1541    Subjective  "Nothing much."    Patient is accompained by:  --   mom           ADULT SLP TREATMENT - 01/20/19 1542      General Information   Behavior/Cognition  Alert;Cooperative;Lethargic;Requires cueing      Treatment Provided   Treatment provided  Cognitive-Linquistic      Cognitive-Linquistic Treatment   Treatment focused on  Aphasia    Skilled Treatment  Cognition 10 minutes: SLP worked with pt's cognitive linguistics including memory/compensations and SLP provided max/total A for reasoning what pt could to do remember previous SLP 's name. Pt recalled features of SLP with yes/no questions but could not provide features (attention vs. aphasia?). Pt recalled why SLP had set timer for 5 minutes by pointing to paper where pt had copied previous SLP's name.  (apahsia - Sp tx ind)Pt noted with aphasic writing with pt writing SLP name - did not know until completed and said "that's not it." SLP wrote  previous SLP name and pt copied her name with min-mod A occasionally - poor error awareness. SLP had pt name items in his cockpit - pt req'd max A to recall items (attention), but also req'd occasional min-mod A for expressive aphasia. Pt would attempt to provide names of items at times but would be gibberish/jargon speech with intermittent awareness.      Assessment / Recommendations / Plan   Plan  --   LTGs downgraded     Progression Toward Goals   Progression toward goals  Progressing toward goals         SLP Short Term Goals - 01/20/19 1647      SLP SHORT TERM GOAL #1   Title  pt will complete aphasia assessment of auditory comprehension and verbal expression within 1-2 sessions    Status  Achieved      SLP SHORT TERM GOAL #2   Title  pt will demo understanding of more complex commands (spoken) with 80% and occasional min cues over three sessions    Status  Not Met      SLP SHORT TERM GOAL #3   Title  pt will answer pertinent questions with at least 3 words    Status  Not Met      SLP SHORT TERM GOAL #4   Title  pt will demo sustained/selective attention necessary to complete efficacious speech therapy  in 6 of the first 8 sessions    Baseline  12-31-18, 01-13-19    Period  --   or 8 sessions   Status  Partially Met       SLP Long Term Goals - 01/20/19 1648      SLP LONG TERM GOAL #1   Title  pt will demo understanding of commands (spoken) with 85% and rare min cues over three sessions    Time  4    Period  Weeks   or 17 sessions, for all LTGs   Status  Revised      SLP LONG TERM GOAL #2   Title  pt will answer questions with [facility] verbal responses 75% of the time, in 3 sessions    Time  4    Period  Weeks    Status  Revised      SLP LONG TERM GOAL #3   Title  pt will sustain loud /a/ with average mid 70s dB over 4 sessions    Time  5    Period  Weeks    Status  Deferred   due to focus on language     SLP LONG TERM GOAL #4   Title  pt will engage in simple  conversation for 5 minutes with average loudness mid 60s dB over 3 sessions    Time  5    Period  Weeks    Status  Deferred   due to focus on language      Plan - 01/20/19 1646    Clinical Impression Statement  Pt with mod Broca's aphasia, dysarthria/voice, and cognitive communication deficits.Pt with demonstrated decr'd sustained attention as well, and 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 Pt smiled and laughed once during session today. Pt arrived with 3-ring binder today for pt to begin to track appointments, hold a journal, and keep exercises, etc. Pt would ocnt to benefit from skilled ST focusing on primarily receptive and expressive language but also at some point in rehab process focus on speech loudness and cognitive communication skills. Pt cont to require skilled ST focusing primarily on language skills.    Speech Therapy Frequency  2x / week    Duration  --   12 weeks or 25 total visits   Treatment/Interventions  Environmental controls;Functional tasks;Compensatory techniques;Multimodal communcation approach;SLP instruction and feedback;Cueing hierarchy;Language facilitation;Cognitive reorganization;Internal/external aids;Patient/family education    Potential to Achieve Goals  Good    Potential Considerations  Co-morbidities;Ability to learn/carryover information;Severity of impairments;Other (comment)   unsure of pt's medical prognosis post-rad/chemo   Consulted and Agree with Plan of Care  Patient;Family member/caregiver    Family Member Consulted  father       Patient will benefit from skilled therapeutic intervention in order to improve the following deficits and impairments:   Aphasia  Cognitive communication deficit  Dysarthria and anarthria    Problem List Patient Active Problem List   Diagnosis Date Noted  . Goals of care, counseling/discussion 12/16/2018  . Palliative care by specialist   . Glioblastoma multiforme of temporal lobe ([place] [facility] [facility]) 11/11/2018  . Spastic hemiparesis  ([facility])   . Cerebral edema (HCC)   . Intracranial tumor ([facility])   . Steroid-induced hyperglycemia   . Spastic hemiplegia affecting nondominant side ([facility])   . Hyponatremia   . Transaminitis   . Leucocytosis   . Right spastic hemiplegia ([facility]) 10/21/2018  . Dysphagia 10/21/2018  . 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 Brain mass   . ICH (intracerebral hemorrhage) ([place]) 10/13/2018  [facility] ,[place], [place]  01/20/2019, 4:51 PM  [facility] [address] [place], [place], [address] Phone: [phone]   Fax:  [phone]   Name: Gregory Moreno MRN: [id number] Date of Birth: [birth year]

## 2019-01-20 NOTE — Therapy (Signed)
[facility] [address] [place], [place], [address] Phone: [phone]   Fax:  [phone]  Occupational Therapy Treatment  Patient Details  Name: Gregory Moreno MRN: [id number] Date of Birth: 1995-12-20 Referring Provider (OT): Marlowe Shores   Encounter Date: 01/20/2019  OT End of Session - 01/20/19 1704    Visit Number  10    Number of Visits  25    Date for OT Re-Evaluation  03/17/19    OT Start Time  1618    OT Stop Time  1700    OT Time Calculation (min)  42 min    Activity Tolerance  Patient tolerated treatment well    Behavior During Therapy  [facility] for tasks assessed/performed       History reviewed. No pertinent past medical history.  Past Surgical History:  Procedure Laterality Date  . CRANIOTOMY Left 10/13/2018   Procedure: LEFT CRANIOTOMY FOR TUMOR RESECTION;  Surgeon: Judith Part, MD;  Location: [facility];  Service: Neurosurgery;  Laterality: Left;    There were no vitals filed for this visit.  Subjective Assessment - 01/20/19 1703    Subjective   I didn't do anything (regarding New Year's Eve)    Patient is accompanied by:  Family member    Currently in Pain?  No/denies    Pain Score  0-No pain                   OT Treatments/Exercises (OP) - 01/20/19 1704      Splinting   Splinting  Fabricated wrist/hand splint to promote better carpal alignment and arches of right hand.  Need to finalize splint next session.                 OT Short Term Goals - 01/13/19 1655      OT SHORT TERM GOAL #1   Title  Patient will complete an HEP designed to improve active motion in RUE due 01/16/19    Status  Achieved      OT SHORT TERM GOAL #2   Title  Patient will reach forward with shoulder flexion, elbow extension pattern to make contact with target in midline and 8-12' from chest with intermittent assist    Status  On-going      OT SHORT TERM GOAL #3   Title  Patient will  transfer from wheelchair to commode with no more than supervision assistance - transferring either toward right or toward left side    Status  Achieved      OT SHORT TERM GOAL #4   Title  Patient will grasp and release a cylindrical object 2-3" in diameter with min assist    Status  Achieved      OT SHORT TERM GOAL #5   Title  Patient will dress lower body with mod assist    Status  Achieved        OT Long Term Goals - 12/17/18 1606      OT LONG TERM GOAL #1   Title  Patient will dress himself with no greater than set up assistance    Time  12    Period  Weeks    Status  New    Target Date  03/17/19      OT LONG TERM GOAL #2   Title  Patient will shower with supervision    Time  12    Period  Weeks    Status  New  OT LONG TERM GOAL #3   Title  Patient will cut food on plate with modified independence    Time  12    Period  Weeks    Status  New      OT LONG TERM GOAL #4   Title  Patient will demonstrate ability to retrieve a lightweight item (less than 2lb) from waist height or lower and transport 6-12 inches laterally with RUE    Time  12    Period  Weeks    Status  New      OT LONG TERM GOAL #5   Title  Patient will demonstrate RUE  mid level reach with minimal LUE support to hit a target at chest height directly in front of body    Time  12    Period  Weeks    Status  New            Plan - 01/20/19 1705    OT Frequency  2x / week    OT Duration  12 weeks    OT Treatment/Interventions  Self-care/ADL training;DME and/or AE instruction;Splinting;Balance training;Aquatic Therapy;Therapeutic activities;Therapeutic exercise;Cognitive remediation/compensation;Neuromuscular education;Functional Mobility Training;Visual/perceptual remediation/compensation;Electrical Stimulation;Manual Therapy;Patient/family education    Plan  NMR RUE/TRUNK, control of more than one joint - attempt some functional use (forced use), fabricate wrist splint    Consulted and Agree  with Plan of Care  Patient;Family member/caregiver       Patient will benefit from skilled therapeutic intervention in order to improve the following deficits and impairments:           Visit Diagnosis: Apraxia  Visuospatial deficit  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 Goals of care, counseling/discussion 12/16/2018  . Palliative care by specialist   . Glioblastoma multiforme of temporal lobe ([facility]) 11/11/2018  . Spastic hemiparesis ([facility] [place])   . Cerebral edema (HCC)   . Intracranial tumor ([place])   . Steroid-induced hyperglycemia   . Spastic hemiplegia affecting nondominant side ([place] [place])   . Hyponatremia   . Transaminitis   . Leucocytosis   . Right spastic hemiplegia ([place]) 10/21/2018  . Dysphagia 10/21/2018  . 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 Brain mass   . ICH (intracerebral hemorrhage) ([facility]) 10/13/2018    Mariah Milling , OTR/L 01/20/2019, 5:06 PM  [facility] [address] [place], [place], [address] Phone: [phone]   Fax:  [phone]  Name: Gregory Moreno MRN: [id number] Date of Birth: 1995/10/13

## 2019-01-23 ENCOUNTER — Ambulatory Visit: Payer: 59 | Admitting: Physical Therapy

## 2019-01-23 ENCOUNTER — Ambulatory Visit: Payer: 59 | Admitting: Occupational Therapy

## 2019-01-23 ENCOUNTER — Encounter: Payer: Self-pay | Admitting: Physical Therapy

## 2019-01-23 ENCOUNTER — Ambulatory Visit: Payer: 59

## 2019-01-23 ENCOUNTER — Other Ambulatory Visit: Payer: Self-pay

## 2019-01-23 ENCOUNTER — Encounter: Payer: Self-pay | Admitting: Occupational Therapy

## 2019-01-23 DIAGNOSIS — M6281 Muscle weakness (generalized): Secondary | ICD-10-CM | POA: Diagnosis not present

## 2019-01-23 DIAGNOSIS — R482 Apraxia: Secondary | ICD-10-CM

## 2019-01-23 DIAGNOSIS — R471 Dysarthria and anarthria: Secondary | ICD-10-CM

## 2019-01-23 DIAGNOSIS — R29818 Other symptoms and signs involving the nervous system: Secondary | ICD-10-CM

## 2019-01-23 DIAGNOSIS — R2681 Unsteadiness on feet: Secondary | ICD-10-CM

## 2019-01-23 DIAGNOSIS — R4701 Aphasia: Secondary | ICD-10-CM

## 2019-01-23 DIAGNOSIS — R41841 Cognitive communication deficit: Secondary | ICD-10-CM

## 2019-01-23 DIAGNOSIS — G8191 Hemiplegia, unspecified affecting right dominant side: Secondary | ICD-10-CM

## 2019-01-23 DIAGNOSIS — R41842 Visuospatial deficit: Secondary | ICD-10-CM

## 2019-01-23 NOTE — Patient Instructions (Signed)
Access Code: [email or web address]  URL: [email or web address]  Date: 01/23/2019  Prepared by: Janann August   Exercises Supine Bridge - 6-8 reps - 2 sets - 2x daily - 7x weekly Hooklying Single Leg Bent Knee Fallouts with Resistance - 6-8 reps - 2x daily - 7x weekly Foot Dorsiflexion PROM Caregiver - 3 sets - 20-30 hold - 2x daily - 7x weekly Seated Heel Slide - 10 reps - 2 sets - 2x daily - 7x weekly Mini Squat with Counter Support - 10 reps - 1 sets - 2x daily - 7x weekly

## 2019-01-23 NOTE — Therapy (Signed)
[facility] [address] [place], [place], [address] Phone: [phone]   Fax:  [phone]  Occupational Therapy Treatment  Patient Details  Name: Gregory Moreno MRN: [id number] Date of Birth: 1995-06-29 Referring Provider (OT): Marlowe Shores   Encounter Date: 01/23/2019  OT End of Session - 01/23/19 1432    Visit Number  11    Number of Visits  25    Date for OT Re-Evaluation  03/17/19    OT Start Time  1315    OT Stop Time  1400    OT Time Calculation (min)  45 min    Activity Tolerance  Patient tolerated treatment well    Behavior During Therapy  [facility] for tasks assessed/performed       History reviewed. No pertinent past medical history.  Past Surgical History:  Procedure Laterality Date  . CRANIOTOMY Left 10/13/2018   Procedure: LEFT CRANIOTOMY FOR TUMOR RESECTION;  Surgeon: Judith Part, MD;  Location: [facility];  Service: Neurosurgery;  Laterality: Left;    There were no vitals filed for this visit.  Subjective Assessment - 01/23/19 1425    Subjective   speech - with cart Glendell Docker)    Patient is accompanied by:  Family member    Currently in Pain?  No/denies    Pain Score  0-No pain                   OT Treatments/Exercises (OP) - 01/23/19 0001      Neurological Re-education Exercises   Other Exercises 1  Neuromuscular reeducation to address postural control, and active use of base of support.  Started in tall kneeling to address hip extension with trunk extension.  Patient with improved hip range, and control today.  Worked on isolating UE movement over active LE base.  Patient able to push , lift and release a large physioball while in kneeling.  Patient able to walk without walker with mod assist to UE bike, working with patient to reduce upper body role in gait.  Patient able to use BUE and single limb RUE to control bike without hand wrapped onto handle.  Followed with seated weight  bearing then reaching patterns of shoulder flexion and extension on tilited surface.  Patient with fewer compensatory trunk movements today.        Splinting   Splinting  Patient issued wrist splint and explained purpose to patient and parent. Requested he wear splint x 1 hour then see if any reddened areas appeared - and to bring back for adjustment.               OT Education - 01/23/19 1431    Education Details  splint schedule, donning/doffing, pressure monitoring    Person(s) Educated  Patient;Parent(s)    Methods  Explanation;Demonstration    Comprehension  Verbalized understanding       OT Short Term Goals - 01/13/19 1655      OT SHORT TERM GOAL #1   Title  Patient will complete an HEP designed to improve active motion in RUE due 01/16/19    Status  Achieved      OT SHORT TERM GOAL #2   Title  Patient will reach forward with shoulder flexion, elbow extension pattern to make contact with target in midline and 8-12' from chest with intermittent assist    Status  On-going      OT SHORT TERM GOAL #3   Title  Patient will transfer from wheelchair  to commode with no more than supervision assistance - transferring either toward right or toward left side    Status  Achieved      OT SHORT TERM GOAL #4   Title  Patient will grasp and release a cylindrical object 2-3" in diameter with min assist    Status  Achieved      OT SHORT TERM GOAL #5   Title  Patient will dress lower body with mod assist    Status  Achieved        OT Long Term Goals - 12/17/18 1606      OT LONG TERM GOAL #1   Title  Patient will dress himself with no greater than set up assistance    Time  12    Period  Weeks    Status  New    Target Date  03/17/19      OT LONG TERM GOAL #2   Title  Patient will shower with supervision    Time  12    Period  Weeks    Status  New      OT LONG TERM GOAL #3   Title  Patient will cut food on plate with modified independence    Time  12    Period  Weeks     Status  New      OT LONG TERM GOAL #4   Title  Patient will demonstrate ability to retrieve a lightweight item (less than 2lb) from waist height or lower and transport 6-12 inches laterally with RUE    Time  12    Period  Weeks    Status  New      OT LONG TERM GOAL #5   Title  Patient will demonstrate RUE  mid level reach with minimal LUE support to hit a target at chest height directly in front of body    Time  12    Period  Weeks    Status  New            Plan - 01/23/19 1432    Clinical Impression Statement  Patient is showing steady improvement in active use of Right limbs - still need to address right attention    OT Frequency  2x / week    OT Duration  12 weeks    OT Treatment/Interventions  Self-care/ADL training;DME and/or AE instruction;Splinting;Balance training;Aquatic Therapy;Therapeutic activities;Therapeutic exercise;Cognitive remediation/compensation;Neuromuscular education;Functional Mobility Training;Visual/perceptual remediation/compensation;Electrical Stimulation;Manual Therapy;Patient/family education    Plan  NMR RUE/TRUNK, control of more than one joint - attempt some functional use (forced use), ask about wrist splint    Consulted and Agree with Plan of Care  Patient;Family member/caregiver    Family Member Consulted  Dad       Patient will benefit from skilled therapeutic intervention in order to improve the following deficits and impairments:           Visit Diagnosis: Apraxia  Visuospatial deficit  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 Goals of care, counseling/discussion 12/16/2018  . Palliative care by specialist   . Glioblastoma multiforme of temporal lobe ([place]) 11/11/2018  . Spastic  hemiparesis ([place])   . Cerebral edema (HCC)   . Intracranial tumor ([place])   . Steroid-induced hyperglycemia   . Spastic hemiplegia affecting  nondominant side ([place])   . Hyponatremia   . Transaminitis   . Leucocytosis   . Right spastic hemiplegia ([place]) 10/21/2018  . Dysphagia 10/21/2018  . 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 Brain mass   . ICH (intracerebral hemorrhage) ([place]) 10/13/2018    Mariah Milling, OTR/L 01/23/2019, 2:34 PM  [facility] [address] [place], [place], [address] Phone: [phone]   Fax:  [phone]  Name: RAELYN SISNEROS MRN: [id number] Date of Birth: [birth year]

## 2019-01-23 NOTE — Patient Instructions (Addendum)
Play some strategy games like Uno, [place], Connect 4 at home. This will help attention, error awareness, problem solving, and language (e.g., the [place] game today in the session).

## 2019-01-23 NOTE — Therapy (Signed)
[facility] [address] [place], [place], [address] Phone: [phone]   Fax:  [phone]  Physical Therapy Treatment  Patient Details  Name: Gregory Moreno MRN: [id number] Date of Birth: Apr 25, 1995 Referring Provider (PT): Alger Simons, MD   Encounter Date: 01/23/2019  PT End of Session - 01/23/19 1525    Visit Number  11    Number of Visits  25    Authorization Type  [employer] - $25 co pay - collect 1 copay, VL: 30 combined (PT/OT/ST), counts as 1 as done on same day    Authorization - Visit Number  1    Authorization - Number of Visits  30    PT Start Time  1230    PT Stop Time  1320    PT Time Calculation (min)  50 min    Equipment Utilized During Treatment  Gait belt    Activity Tolerance  Patient tolerated treatment well    Behavior During Therapy  [facility] for tasks assessed/performed       History reviewed. No pertinent past medical history.  Past Surgical History:  Procedure Laterality Date  . CRANIOTOMY Left 10/13/2018   Procedure: LEFT CRANIOTOMY FOR TUMOR RESECTION;  Surgeon: Judith Part, MD;  Location: [facility];  Service: Neurosurgery;  Laterality: Left;    There were no vitals filed for this visit.  Subjective Assessment - 01/23/19 1233    Subjective  Has been walking with the walker at home - it is going well. Had one episode the other day where he wobbled in his walker.    Limitations  Sitting;Walking;Standing    How long can you walk comfortably?  100' with hemiwalker    Patient Stated Goals  wants to improve his walking, get back to normal    Currently in Pain?  No/denies                       [facility] Adult PT Treatment/Exercise - 01/23/19 0001      Bed Mobility   Bed Mobility  Supine to Sit    Rolling Left  Minimal Assistance - Patient > 75%   needed cues to initiate   Left Sidelying to Sit  Supervision/Verbal cueing      Transfers   Squat Pivot Transfers  5:  Supervision;4: Min guard    Squat Pivot Transfer Details (indicate cue type and reason)  initial cueing for head hips relationship and weight shifting before performing      Ambulation/Gait   Ambulation/Gait  Yes    Ambulation/Gait Assistance  4: Min assist    Ambulation/Gait Assistance Details  Manual cues through trunk and pelvis and verbal cues for standing tall through RLE during stance phase to clear LLE without hyperextending RLE - min/mod A for gait with PT preventing recurvatum and OT present halfway through gait bout to help with RW navigation as pt tends to push too far anteriorly and to facilitate more upright posture to decrease recurvatum on R. Cues to relax arms while pushing RW.     Ambulation Distance (Feet)  80 Feet    Assistive device  Rolling walker;Other (Comment)   R hand orthosis, R AFO   Gait Pattern  Decreased stance time - right;Decreased step length - left;Decreased weight shift to right;Decreased hip/knee flexion - right;Right genu recurvatum;Trunk flexed;Narrow base of support;Poor foot clearance - right;Step-through pattern;Decreased dorsiflexion - right    Ambulation Surface  Level;Indoor      Neuro  Re-ed    Neuro Re-ed Details   Standing at [place] - with therapist standing next to pt on his R hip to hip, standing weight shifting to R with no UE support and cues to stand tall and quad activation on RLE while weight shifting and during static standing. 4 x 10 second reps of balance in midline with eyes closed - pt fearful of holding > 10 seconds, 3 x 5 reps head turns, needing min guard for balance.       Exercises   Exercises  Other Exercises    Other Exercises   Sidelying clamshells on R 1 x 10 reps with therapist posteriorly to pt for positioning and to prevent hips from rolling forward, 1 x 10 reps bridging with BLE more inferiorly for targeting hamstrings - with therapist providing tactile and verbal cues for pt to press out into therapist's hands to activate  hip ABD before performing bridge (needed frequent attempts at these cues), 1 x 10 reps bent knee fall outs with RLE, upgraded to red theraband and provided for home use with HEP. 2 x5  reps heel slides in supine with use of pillow on shoe for increased ease of movement, instructed on performing at home in sitting with removal of brace.              PT Education - 01/23/19 1525    Education Details  additions to HEP    Person(s) Educated  Patient;Parent(s)    Methods  Explanation;Demonstration    Comprehension  Verbalized understanding;Returned demonstration       PT Short Term Goals - 01/14/19 1122      PT SHORT TERM GOAL #1   Title  Patient and pt's family will be independent with ongoing HEP designed to improve movement and strength in RLE.  ALL STGS DUE 02/11/19    Time  4    Period  Weeks    Status  On-going    Target Date  02/11/19      PT SHORT TERM GOAL #2   Title  Patient will stand at countertop for at least 8 minutes with supervision to increase tolerance for ADLs.    Baseline  did not have time to formally assess on 01/13/19    Period  Weeks    Status  On-going      PT SHORT TERM GOAL #3   Title  Pt will perform stand pivot vs. squat pivot transfer to either R or L from wc to mat table with mod I  in order to decrease caregiver burden.    Baseline  needs initially cueing for technique, min guard for balance for squat pivot transfer    Time  4    Period  Weeks    Status  Revised      PT SHORT TERM GOAL #4   Title  Pt will perform all bed mobility with supervision in order to decrease caregiver burden.    Baseline  needs min A to assist with rolling and for balance for performing sidelying > seated at edge of bed.    Time  4    Period  Weeks    Status  Revised      PT SHORT TERM GOAL #5   Title  Pt will ambulate at least 42' with RW, R AFO and min guard in order to improve household mobility.    Baseline  69' with RW, R AFO and R hand orthosis with min/mod A  for balance and  RW navigation.    Time  4    Period  Weeks    Status  Not Met        PT Long Term Goals - 12/18/18 1827      PT LONG TERM GOAL #1   Title  Patient and pt's family will be independent with final HEP designed to improve movement and strength in RLE.  ALL LTGS DUE 03/12/19    Time  12    Period  Weeks    Status  New    Target Date  03/12/19      PT LONG TERM GOAL #2   Title  Pt will ambulate at least 300' with supervision and hemiwalker vs. LRAD in order to improve community mobility.    Time  12    Period  Weeks    Status  New      PT LONG TERM GOAL #3   Title  Pt will perform at least 10 sit <> stands from mat table with proper technique with no cueing and mod I in order to improve functional transfers and demo improved LE strength.    Time  12    Period  Weeks    Status  New      PT LONG TERM GOAL #4   Title  Pt will perform all bed mobility from level mat table with mod I in order to decrease caregiver burden.    Time  12    Period  Weeks    Status  New      PT LONG TERM GOAL #5   Title  Pt will perform 4 steps with step to pattern and single handrail with supervision.    Time  12    Period  Weeks    Status  New            Plan - 01/23/19 1955    Clinical Impression Statement  Focus of today's skilled session focused on supine/sidelying RLE strengthening, standing balance at countertop, and gait training with RW. Pt able to stand at countertop today with no UE support and min guard/min A with eyes closed for 10 seconds with increased weight shifting through RLE. Pt demonstrating more fluid gait pattern today with less episodes of R genu recurvatum when RW is kept close to pt and for verbal and tactile cues to stand tall through RLE. Pt is progressing well - will continue to progress towards LTGs.    Personal Factors and Comorbidities  --   pt ungergoing chemo/radiation   Examination-Activity Limitations  Bed Mobility;Locomotion  Level;Sit;Squat;Stairs;Stand;Transfers    Examination-Participation Restrictions  Community Activity;School;Driving    Stability/Clinical Decision Making  Evolving/Moderate complexity    Rehab Potential  Good    PT Frequency  2x / week    PT Duration  12 weeks    PT Treatment/Interventions  ADLs/Self Care Home Management;Therapeutic exercise;Therapeutic activities;Functional mobility training;Stair training;Gait training;DME Instruction;Balance training;Neuromuscular re-education;Patient/family education;Orthotic Fit/Training;Wheelchair mobility training;Energy conservation;Passive range of motion    PT Next Visit Plan  continue gait with RW. tall kneeling when appropriate and have a 2nd set of hands, NMR for weight shifting and stance on RLE. sit <> stands from higher mat table, mini squats. weight shifting toward L and stepping with RLE.  Use mirror for visual cue for wt shifting. Add to HEP when appropriate.    PT Home Exercise Plan  [email or web address]    Consulted and Agree with Plan of Care  Patient;Family member/caregiver    Family Member Consulted  dad, Kinsley       Patient will benefit from skilled therapeutic intervention in order to improve the following deficits and impairments:  Abnormal gait, Decreased activity tolerance, Decreased balance, Decreased cognition, Decreased coordination, Decreased safety awareness, Decreased range of motion, Decreased mobility, Difficulty walking, Decreased strength, Decreased endurance, Impaired tone  Visit Diagnosis: Muscle weakness (generalized)  Unsteadiness on feet  Other symptoms and signs involving the nervous system     Problem List Patient Active Problem List   Diagnosis Date Noted  . Goals of care, counseling/discussion 12/16/2018  . Palliative care by specialist   . Glioblastoma multiforme of temporal lobe ([facility]) 11/11/2018  . Spastic hemiparesis ([facility])   . Cerebral edema (HCC)   . Intracranial tumor ([place])   . Steroid-induced hyperglycemia    . Spastic hemiplegia affecting nondominant side ([place])   . Hyponatremia   . Transaminitis   . Leucocytosis   . Right spastic hemiplegia ([facility]) 10/21/2018  . Dysphagia 10/21/2018  . 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 Brain mass   . ICH (intracerebral hemorrhage) ([facility]) 10/13/2018    Arliss Journey, PT, DPT  01/23/2019, 7:57 PM  [facility] [address] [place], [place], [address] Phone: [phone]   Fax:  [phone]  Name: Gregory Moreno MRN: [id number] Date of Birth: 1995/12/15

## 2019-01-23 NOTE — Therapy (Signed)
[facility] [address] [place], [place], [address] Phone: [phone]   Fax:  [phone]  Speech Language Pathology Treatment  Patient Details  Name: Gregory Moreno MRN: [id number] Date of Birth: [birth year] Referring Provider (SLP): Alger Simons, MD   Encounter Date: 01/23/2019  End of Session - 01/23/19 1605    Visit Number  10    Number of Visits  25    Date for SLP Re-Evaluation  03/17/19    SLP Start Time  7253    SLP Stop Time   1446    SLP Time Calculation (min)  42 min    Activity Tolerance  Patient tolerated treatment well       History reviewed. No pertinent past medical history.  Past Surgical History:  Procedure Laterality Date  . CRANIOTOMY Left 10/13/2018   Procedure: LEFT CRANIOTOMY FOR TUMOR RESECTION;  Surgeon: Judith Part, MD;  Location: [facility];  Service: Neurosurgery;  Laterality: Left;    There were no vitals filed for this visit.  Subjective Assessment - 01/23/19 1439    Subjective  "Hi"    Patient is accompained by:  Family member   dad   Currently in Pain?  No/denies            ADULT SLP TREATMENT - 01/23/19 1440      General Information   Behavior/Cognition  Alert;Cooperative;Lethargic;Requires cueing      Treatment Provided   Treatment provided  Cognitive-Linquistic      Cognitive-Linquistic Treatment   Treatment focused on  [place] today for practice with colors and numbers. Pt self corrected in 2/6 opportunities. Req'd SLP mod cues for correct color and number usually - semantic paraphasias prevalent with limited awareness of errors. Pt with uncolicited spontaneous comment after [employer] game when leaving [place] room, "Thanks. that was fun."      Assessment / Recommendations / [place] with current plan of care      Progression Toward Goals   Progression toward goals  Progressing toward goals       SLP Education -  01/23/19 1604    Education Details  games at home is functional way to work on attention, problem solving, error awarness, and language    Person(s) Educated  Patient;Parent(s)    Methods  Explanation;Demonstration    Comprehension  Verbalized understanding;Need further instruction       SLP Short Term Goals - 01/20/19 1647      SLP SHORT TERM GOAL #1   Title  pt will complete aphasia assessment of auditory comprehension and verbal expression within 1-2 sessions    Status  Achieved      SLP SHORT TERM GOAL #2   Title  pt will demo understanding of more complex commands (spoken) with 80% and occasional min cues over three sessions    Status  Not Met      SLP SHORT TERM GOAL #3   Title  pt will answer pertinent questions with at least 3 words    Status  Not Met      SLP SHORT TERM GOAL #4   Title  pt will demo sustained/selective attention necessary to complete efficacious speech therapy in 6 of the first 8 sessions    Baseline  12-31-18, 01-13-19    Period  --   or 8 sessions   Status  Partially Met       SLP Long Term Goals -  01/23/19 1606      SLP LONG TERM GOAL #1   Title  pt will demo understanding of commands (spoken) with 85% and rare min cues over three sessions    Time  4    Period  Weeks   or 17 sessions, for all LTGs   Status  Revised      SLP LONG TERM GOAL #2   Title  pt will answer questions with [facility] verbal responses 75% of the time, in 3 sessions    Time  4    Period  Weeks    Status  Revised      SLP LONG TERM GOAL #3   Title  pt will sustain loud /a/ with average mid 70s dB over 4 sessions    Time  5    Period  Weeks    Status  Deferred   due to focus on language     SLP LONG TERM GOAL #4   Title  pt will engage in simple conversation for 5 minutes with average loudness mid 60s dB over 3 sessions    Time  5    Period  Weeks    Status  Deferred   due to focus on language      Plan - 01/23/19 1605    Clinical Impression Statement  Pt with  mod Broca's aphasia, dysarthria/voice, and cognitive communication deficits.Pt with demonstrated decr'd sustained attention as well, and 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 Pt smiled and laughed once during session today. Pt arrived with 3-ring binder today for pt to begin to track appointments, hold a journal, and keep exercises, etc. Pt would ocnt to benefit from skilled ST focusing on primarily receptive and expressive language but also at some point in rehab process focus on speech loudness and cognitive communication skills. Pt cont to require skilled ST focusing primarily on language skills.    Speech Therapy Frequency  2x / week    Duration  --   12 weeks or 25 total visits   Treatment/Interventions  Environmental controls;Functional tasks;Compensatory techniques;Multimodal communcation approach;SLP instruction and feedback;Cueing hierarchy;Language facilitation;Cognitive reorganization;Internal/external aids;Patient/family education    Potential to Achieve Goals  Good    Potential Considerations  Co-morbidities;Ability to learn/carryover information;Severity of impairments;Other (comment)   unsure of pt's medical prognosis post-rad/chemo   Consulted and Agree with Plan of Care  Patient;Family member/caregiver    Family Member Consulted  father       Patient will benefit from skilled therapeutic intervention in order to improve the following deficits and impairments:   Aphasia  Cognitive communication deficit  Dysarthria and anarthria    Problem List Patient Active Problem List   Diagnosis Date Noted  . Goals of care, counseling/discussion 12/16/2018  . Palliative care by specialist   . Glioblastoma multiforme of temporal lobe ([place]) 11/11/2018  . Spastic hemiparesis ([facility])   . Cerebral edema (HCC)   . Intracranial tumor ([place])   . Steroid-induced hyperglycemia   . Spastic hemiplegia affecting nondominant side ([place])   . Hyponatremia   . Transaminitis   . Leucocytosis   . Right spastic  hemiplegia ([facility]) 10/21/2018  . Dysphagia 10/21/2018  . 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 Brain mass   . ICH (intracerebral hemorrhage) ([facility]) 10/13/2018    [facility] ,[place], [place]  01/23/2019, 4:06 PM  [facility] [address] [place], [place], [address] Phone: [phone]   Fax:  [phone]   Name: Gregory Moreno MRN: [id number] Date of  Birth: [birth year]

## 2019-01-26 ENCOUNTER — Telehealth: Payer: Self-pay | Admitting: *Deleted

## 2019-01-26 NOTE — Telephone Encounter (Signed)
Faxed approval for dental work to [employer] [phone]

## 2019-01-26 NOTE — Telephone Encounter (Signed)
Patients mother called to report patient is complaining of tooth pain/sensitivity.  Made Dr. [name] aware and he gave permission to be evaluated by a dentist with treatment allowed.

## 2019-01-27 ENCOUNTER — Ambulatory Visit: Payer: 59

## 2019-01-27 ENCOUNTER — Ambulatory Visit: Payer: 59 | Admitting: Physical Therapy

## 2019-01-27 ENCOUNTER — Other Ambulatory Visit: Payer: Self-pay

## 2019-01-27 ENCOUNTER — Ambulatory Visit: Payer: 59 | Admitting: Occupational Therapy

## 2019-01-27 ENCOUNTER — Encounter: Payer: Self-pay | Admitting: Occupational Therapy

## 2019-01-27 DIAGNOSIS — R41842 Visuospatial deficit: Secondary | ICD-10-CM

## 2019-01-27 DIAGNOSIS — M6281 Muscle weakness (generalized): Secondary | ICD-10-CM | POA: Diagnosis not present

## 2019-01-27 DIAGNOSIS — R29818 Other symptoms and signs involving the nervous system: Secondary | ICD-10-CM

## 2019-01-27 DIAGNOSIS — R471 Dysarthria and anarthria: Secondary | ICD-10-CM

## 2019-01-27 DIAGNOSIS — R41841 Cognitive communication deficit: Secondary | ICD-10-CM

## 2019-01-27 DIAGNOSIS — G8191 Hemiplegia, unspecified affecting right dominant side: Secondary | ICD-10-CM

## 2019-01-27 DIAGNOSIS — R4701 Aphasia: Secondary | ICD-10-CM

## 2019-01-27 DIAGNOSIS — R2681 Unsteadiness on feet: Secondary | ICD-10-CM

## 2019-01-27 DIAGNOSIS — R482 Apraxia: Secondary | ICD-10-CM

## 2019-01-27 NOTE — Therapy (Signed)
[facility] [address] [place], [place], [address] Phone: [phone]   Fax:  [phone]  Speech Language Pathology Treatment  Patient Details  Name: Gregory Moreno MRN: [id number] Date of Birth: [birth year] Referring Provider (SLP): Alger Simons, MD   Encounter Date: 01/27/2019  End of Session - 01/27/19 1655    Visit Number  11    Number of Visits  25    Date for SLP Re-Evaluation  03/17/19    SLP Start Time  81    SLP Stop Time   1615    SLP Time Calculation (min)  41 min    Activity Tolerance  Patient tolerated treatment well       History reviewed. No pertinent past medical history.  Past Surgical History:  Procedure Laterality Date  . CRANIOTOMY Left 10/13/2018   Procedure: LEFT CRANIOTOMY FOR TUMOR RESECTION;  Surgeon: Judith Part, MD;  Location: [facility];  Service: Neurosurgery;  Laterality: Left;    There were no vitals filed for this visit.  Subjective Assessment - 01/27/19 1643    Subjective  "how are you?"    Patient is accompained by:  --   Mom   Currently in Pain?  No/denies            ADULT SLP TREATMENT - 01/27/19 1643      General Information   Behavior/Cognition  Alert;Cooperative;Lethargic;Requires cueing;Decreased sustained attention      Treatment Provided   Treatment provided  Cognitive-Linquistic      Cognitive-Linquistic Treatment   Treatment focused on  Cognition    Skilled Treatment  SLP asked pt who was seen in the hall on the way in, as OT Leia Alf) passed pt/mom/SLP while exiting her office and pt/mom/SLP entering [facility] office. Pt req'd total A re: name of OT and what OT did (Gellert is pt's primray OT). Pt used written cues to descirbe pt's PT (Chloe) - total A for categories of description (hair length, hair color, eye color, height, glasses?) and consistent (90%) max A for [name] features even with picture cue from [name] LinkedIn site. SLP guided pt through a  simple web search for finding his department at [facility] and pt req'd mod A usually (65%) to do so. SLP suggested pt get started on [employer] - get a username and password and SLP on Friday can assist pt/family in introducing the site/app to pt/family.       Assessment / Recommendations / Plan   Plan  Continue with current plan of care      Progression Toward Goals   Progression toward goals  Progressing toward goals         SLP Short Term Goals - 01/20/19 1647      SLP SHORT TERM GOAL #1   Title  pt will complete aphasia assessment of auditory comprehension and verbal expression within 1-2 sessions    Status  Achieved      SLP SHORT TERM GOAL #2   Title  pt will demo understanding of more complex commands (spoken) with 80% and occasional min cues over three sessions    Status  Not Met      SLP SHORT TERM GOAL #3   Title  pt will answer pertinent questions with at least 3 words    Status  Not Met      SLP SHORT TERM GOAL #4   Title  pt will demo sustained/selective attention necessary to complete efficacious speech therapy in 6 of the  first 8 sessions    Baseline  12-31-18, 01-13-19    Period  --   or 8 sessions   Status  Partially Met       SLP Long Term Goals - 01/27/19 1656      SLP LONG TERM GOAL #1   Title  pt will demo understanding of commands (spoken) with 85% and rare min cues over three sessions    Time  3    Period  Weeks   or 17 sessions, for all LTGs   Status  On-going      SLP LONG TERM GOAL #2   Title  pt will answer questions with [facility] verbal responses 75% of the time, in 3 sessions    Baseline  01-27-19    Time  3    Period  Weeks    Status  On-going      SLP LONG TERM GOAL #3   Title  pt will sustain loud /a/ with average mid 70s dB over 4 sessions    Time  5    Period  Weeks    Status  Deferred   due to focus on language     SLP LONG TERM GOAL #4   Title  pt will engage in simple conversation for 5 minutes with average loudness mid 60s  dB over 3 sessions    Time  5    Period  Weeks    Status  Deferred   due to focus on language      Plan - 01/27/19 1655    Clinical Impression Statement  Pt with mod Broca's aphasia, dysarthria/voice, and cognitive communication deficits. 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 Pt progressing with attention and will get started on Talk[employer] Therapy this week as mom stated pt was on his phone playing some games earlier this week. Pt would cont to benefit from skilled ST focusing on primarily receptive and expressive language but also at some point in rehab process focus on speech loudness and cognitive communication skills. Pt cont to require skilled ST focusing primarily on language skills.    Speech Therapy Frequency  2x / week    Duration  --   12 weeks or 25 total visits   Treatment/Interventions  Environmental controls;Functional tasks;Compensatory techniques;Multimodal communcation approach;SLP instruction and feedback;Cueing hierarchy;Language facilitation;Cognitive reorganization;Internal/external aids;Patient/family education    Potential to Achieve Goals  Good    Potential Considerations  Co-morbidities;Ability to learn/carryover information;Severity of impairments;Other (comment)   unsure of pt's medical prognosis post-rad/chemo   Consulted and Agree with Plan of Care  Patient;Family member/caregiver    Family Member Consulted  father       Patient will benefit from skilled therapeutic intervention in order to improve the following deficits and impairments:   Cognitive communication deficit  Aphasia  Dysarthria and anarthria    Problem List Patient Active Problem List   Diagnosis Date Noted  . Goals of care, counseling/discussion 12/16/2018  . Palliative care by specialist   . Glioblastoma multiforme of temporal lobe ([facility]) 11/11/2018  . Spastic hemiparesis ([facility])   . Cerebral edema (HCC)   . Intracranial tumor ([place])   .  Steroid-induced hyperglycemia   . Spastic hemiplegia affecting nondominant side ([facility])   . Hyponatremia   . Transaminitis   . Leucocytosis   . Right spastic hemiplegia ([facility]) 10/21/2018  . Dysphagia 10/21/2018  . 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 Brain mass   . ICH (intracerebral hemorrhage) (  [place]) 10/13/2018    [facility] ,[place], [place]  01/27/2019, 4:58 PM  [facility] [address] [place], [place], [address] Phone: [phone]   Fax:  [phone]   Name: CLOVIS MANKINS MRN: [id number] Date of Birth: [birth year]

## 2019-01-27 NOTE — Therapy (Signed)
[facility] [address] [place], [place], [address] Phone: [phone]   Fax:  [phone]  Occupational Therapy Treatment  Patient Details  Name: Gregory Moreno MRN: [id number] Date of Birth: 07-19-1995 Referring Provider (OT): Marlowe Shores   Encounter Date: 01/27/2019  OT End of Session - 01/27/19 1812    Visit Number  12    Number of Visits  25    Date for OT Re-Evaluation  03/17/19    OT Start Time  1615    OT Stop Time  1700    OT Time Calculation (min)  45 min    Activity Tolerance  Patient tolerated treatment well    Behavior During Therapy  [facility] for tasks assessed/performed       History reviewed. No pertinent past medical history.  Past Surgical History:  Procedure Laterality Date  . CRANIOTOMY Left 10/13/2018   Procedure: LEFT CRANIOTOMY FOR TUMOR RESECTION;  Surgeon: Judith Part, MD;  Location: [facility];  Service: Neurosurgery;  Laterality: Left;    There were no vitals filed for this visit.  Subjective Assessment - 01/27/19 1708    Subjective   Hi Gregory Moreno    Patient is accompanied by:  Family member    Currently in Pain?  No/denies    Pain Score  0-No pain                   OT Treatments/Exercises (OP) - 01/27/19 0001      Neurological Re-education Exercises   Other Exercises 1  Neuromuscular reeducation to address awareness and weight bearing through right arm in sidelying and in sitting during transitional movements.  Patient with active biceps but difficulty reducing activation to activate triceps - arm gets stuck.  Does best with slight propioceptive and visual input.  Worked on sit to stand and scooting with RUE a s part of base of support to increase tricep activation into surface.      Other Exercises 2  Patient wearing wrist splint and demos noticeable release in tension in hand.  Able to isolate thumb IP motion and digit mass extension and flexion in brace.  No  redness or swelling noted - mom and patient instructed to continue with daytime use.               OT Education - 01/27/19 1812    Education Details  splint check - continue with current schedule - daytime use as needed (not for walking)    Person(s) Educated  Patient;Parent(s)    Methods  Explanation    Comprehension  Verbalized understanding       OT Short Term Goals - 01/13/19 1655      OT SHORT TERM GOAL #1   Title  Patient will complete an HEP designed to improve active motion in RUE due 01/16/19    Status  Achieved      OT SHORT TERM GOAL #2   Title  Patient will reach forward with shoulder flexion, elbow extension pattern to make contact with target in midline and 8-12' from chest with intermittent assist    Status  On-going      OT SHORT TERM GOAL #3   Title  Patient will transfer from wheelchair to commode with no more than supervision assistance - transferring either toward right or toward left side    Status  Achieved      OT SHORT TERM GOAL #4   Title  Patient will grasp and release  a cylindrical object 2-3" in diameter with min assist    Status  Achieved      OT SHORT TERM GOAL #5   Title  Patient will dress lower body with mod assist    Status  Achieved        OT Long Term Goals - 12/17/18 1606      OT LONG TERM GOAL #1   Title  Patient will dress himself with no greater than set up assistance    Time  12    Period  Weeks    Status  New    Target Date  03/17/19      OT LONG TERM GOAL #2   Title  Patient will shower with supervision    Time  12    Period  Weeks    Status  New      OT LONG TERM GOAL #3   Title  Patient will cut food on plate with modified independence    Time  12    Period  Weeks    Status  New      OT LONG TERM GOAL #4   Title  Patient will demonstrate ability to retrieve a lightweight item (less than 2lb) from waist height or lower and transport 6-12 inches laterally with RUE    Time  12    Period  Weeks    Status  New       OT LONG TERM GOAL #5   Title  Patient will demonstrate RUE  mid level reach with minimal LUE support to hit a target at chest height directly in front of body    Time  12    Period  Weeks    Status  New            Plan - 01/27/19 1813    Clinical Impression Statement  Patient continues with improved activity tolerance, and showing more brightness to affect.    OT Frequency  2x / week    OT Duration  12 weeks    OT Treatment/Interventions  Self-care/ADL training;DME and/or AE instruction;Splinting;Balance training;Aquatic Therapy;Therapeutic activities;Therapeutic exercise;Cognitive remediation/compensation;Neuromuscular education;Functional Mobility Training;Visual/perceptual remediation/compensation;Electrical Stimulation;Manual Therapy;Patient/family education    Plan  NMR RUE/TRUNK, control of more than one joint - attempt some functional use (forced use)    Consulted and Agree with Plan of Care  Patient;Family member/caregiver       Patient will benefit from skilled therapeutic intervention in order to improve the following deficits and impairments:           Visit Diagnosis: 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 Goals of care, counseling/discussion 12/16/2018  . Palliative care by specialist   . 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 Spastic hemiparesis ([place])   . Cerebral edema (HCC)   . Intracranial tumor ([place])   . Steroid-induced hyperglycemia   . Spastic hemiplegia affecting nondominant side ([place])   . Hyponatremia   . Transaminitis   . Leucocytosis   . Right spastic hemiplegia ([place]) 10/21/2018  . Dysphagia 10/21/2018  . 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 Brain mass   . ICH (intracerebral hemorrhage) ([place]) 10/13/2018    Gregory Moreno, OTR/L 01/27/2019, 6:15 PM  [place]  [facility] [address] [place], [place], [address] Phone: [phone]   Fax:  [phone]  Name: Gregory Moreno MRN: [id number] Date of Birth: [birth year]

## 2019-01-27 NOTE — Patient Instructions (Signed)
Download the talkpath therapy app and bring it on Friday!

## 2019-01-28 ENCOUNTER — Other Ambulatory Visit: Payer: Self-pay | Admitting: *Deleted

## 2019-01-28 NOTE — Telephone Encounter (Signed)
Received vm message from pt's mother regarding a refill request for his Amantidine. I did not see this on his med list, though it is noted in your notes from 01/13/19   Please advise

## 2019-01-28 NOTE — Therapy (Signed)
[facility] [address] [place], [place], [address] Phone: [phone]   Fax:  [phone]  Physical Therapy Treatment  Patient Details  Name: Gregory Moreno MRN: [id number] Date of Birth: [birth year] Referring Provider (PT): Alger Simons, MD   Encounter Date: 01/27/2019  PT End of Session - 01/28/19 0826    Visit Number  12    Number of Visits  25    Authorization Type  [employer] - $25 co pay - collect 1 copay, VL: 30 combined (PT/OT/ST), counts as 1 as done on same day    Authorization - Visit Number  2    Authorization - Number of Visits  30    PT Start Time  3614    PT Stop Time  1755    PT Time Calculation (min)  44 min    Equipment Utilized During Treatment  Gait belt    Activity Tolerance  Patient tolerated treatment well    Behavior During Therapy  [facility] for tasks assessed/performed       No past medical history on file.  Past Surgical History:  Procedure Laterality Date  . CRANIOTOMY Left 10/13/2018   Procedure: LEFT CRANIOTOMY FOR TUMOR RESECTION;  Surgeon: Judith Part, MD;  Location: [facility];  Service: Neurosurgery;  Laterality: Left;    There were no vitals filed for this visit.  Subjective Assessment - 01/27/19 1714    Subjective  Has been pretty good, no falls. Did a little bit of walking over the weekend. Hasn't tried the new exercise for home.    Limitations  Sitting;Walking;Standing    How long can you walk comfortably?  100' with hemiwalker    Patient Stated Goals  wants to improve his walking, get back to normal    Currently in Pain?  No/denies                       [facility] Adult PT Treatment/Exercise - 01/28/19 0001      Transfers   Sit to Stand  4: Min guard;5: Supervision    Sit to Stand Details  Verbal cues for technique;Visual cues/gestures for sequencing;Verbal cues for sequencing    Sit to Stand Details (indicate cue type and reason)  From elevated mat table in  high perch position: massed practice sit <> stand with no UE support, cues for anterior weight shifting. able to maintain balance in standing with no UE support. Chair anteriorly for safety. Mirror also anteriorly for visual cue for weight shifting - pt able to use mirror to initiate weight shift with minimal verbal cues today, needed intermittent verbal and tactile cues for quad activation on R when weight shifting.     Stand to Sit  4: Min guard;4: Min assist    Stand to Sit Details (indicate cue type and reason)  Tactile cues for weight shifting;Tactile cues for initiation;Tactile cues for placement;Tactile cues for weight beaing;Visual cues for safe use of DME/AE    Stand to Sit Details  Verbal and tactile cues to hip hinge prior to sitting without using UE, pt with tendency to lack eccentric control when sitting and has decreased hip flexion when performing.  Pt able to verbalize today when he did not perform transfer correctly     Squat Pivot Transfers  5: Supervision;4: Min guard    Squat Pivot Transfer Details (indicate cue type and reason)  initial cueing for head hips relationship before performing transfer, needs simple verbal and tactile cue.  from w/c <> mat table    Comments  2 x 5 reps mini squats with no UE support, tactile cues for quad activation and keeping weight to R       Ambulation/Gait   Ambulation/Gait  Yes    Ambulation/Gait Assistance  4: Min assist    Ambulation/Gait Assistance Details  Verbal cues to stand tall through RLE in midstance  to prevent genu recurvatum. Manual facilitation and pelvis and trunk for tall posture and min A behind R knee to prevent recurvatum, however pt with less episodes today during gait. Couple episodes of recurvatum when pt fatigued or performing turns. Needed cues to help keep RW close by to facilitate more upright posture and cues for foot placement to prevent narrow BOS (to bring feet towards colored theraband at front of RW). W/c follow for  safety.     Ambulation Distance (Feet)  70 Feet    Assistive device  Rolling walker;Other (Comment)   R hand orthosis, R  AFO   Gait Pattern  Decreased stance time - right;Decreased step length - left;Decreased weight shift to right;Decreased hip/knee flexion - right;Right genu recurvatum;Trunk flexed;Narrow base of support;Poor foot clearance - right;Step-through pattern;Decreased dorsiflexion - right    Ambulation Surface  Level;Indoor      Neuro Re-ed    Neuro Re-ed Details   Standing in // bars: with BUE support (RUE able to grasp onto // bars) 2 x 8 reps weight shifting to RLE and tapping 2" block with LLE, 1 x 8 reps shifting to RLE and tapping 4" step with LLE. Therapist providing min A at knee and to prevent R hip from retracting when performing and for midline positioning. Use of mirror for visual cueing for midline. Pt with no forceful genu recurvatum today - demonstrated improved weight shifting to RLE.                 PT Short Term Goals - 01/14/19 1122      PT SHORT TERM GOAL #1   Title  Patient and pt's family will be independent with ongoing HEP designed to improve movement and strength in RLE.  ALL STGS DUE 02/11/19    Time  4    Period  Weeks    Status  On-going    Target Date  02/11/19      PT SHORT TERM GOAL #2   Title  Patient will stand at countertop for at least 8 minutes with supervision to increase tolerance for ADLs.    Baseline  did not have time to formally assess on 01/13/19    Period  Weeks    Status  On-going      PT SHORT TERM GOAL #3   Title  Pt will perform stand pivot vs. squat pivot transfer to either R or L from wc to mat table with mod I  in order to decrease caregiver burden.    Baseline  needs initially cueing for technique, min guard for balance for squat pivot transfer    Time  4    Period  Weeks    Status  Revised      PT SHORT TERM GOAL #4   Title  Pt will perform all bed mobility with supervision in order to decrease caregiver  burden.    Baseline  needs min A to assist with rolling and for balance for performing sidelying > seated at edge of bed.    Time  4    Period  Weeks  Status  Revised      PT SHORT TERM GOAL #5   Title  Pt will ambulate at least 115' with RW, R AFO and min guard in order to improve household mobility.    Baseline  66' with RW, R AFO and R hand orthosis with min/mod A for balance and RW navigation.    Time  4    Period  Weeks    Status  Not Met        PT Long Term Goals - 12/18/18 1827      PT LONG TERM GOAL #1   Title  Patient and pt's family will be independent with final HEP designed to improve movement and strength in RLE.  ALL LTGS DUE 03/12/19    Time  12    Period  Weeks    Status  New    Target Date  03/12/19      PT LONG TERM GOAL #2   Title  Pt will ambulate at least 300' with supervision and hemiwalker vs. LRAD in order to improve community mobility.    Time  12    Period  Weeks    Status  New      PT LONG TERM GOAL #3   Title  Pt will perform at least 10 sit <> stands from mat table with proper technique with no cueing and mod I in order to improve functional transfers and demo improved LE strength.    Time  12    Period  Weeks    Status  New      PT LONG TERM GOAL #4   Title  Pt will perform all bed mobility from level mat table with mod I in order to decrease caregiver burden.    Time  12    Period  Weeks    Status  New      PT LONG TERM GOAL #5   Title  Pt will perform 4 steps with step to pattern and single handrail with supervision.    Time  12    Period  Weeks    Status  New            Plan - 01/28/19 5916    Clinical Impression Statement  Focus of today's skilled session was LE strengthening from a higher surface, standing balance, gait training, and weight shifting towards RLE. Pt able to perform sit <> stands from an elevated mat surface today with no UE support and decr hip ADD and IR when initially standing. Better ability to initiate  shifting weight to R and activating R quad when weight shifting without cueing with use of mirror for visual feedback. Pt had less episodes of R genu recurvatum today during gait, only most notably in turns. Also able to perform step taps to 2" and 4" step with LLE while weight shifting to RLE with better knee control today. Will continue to progress towards LTGs.    Personal Factors and Comorbidities  --   pt ungergoing chemo/radiation   Examination-Activity Limitations  Bed Mobility;Locomotion Level;Sit;Squat;Stairs;Stand;Transfers    Examination-Participation Restrictions  Community Activity;School;Driving    Stability/Clinical Decision Making  Evolving/Moderate complexity    Rehab Potential  Good    PT Frequency  2x / week    PT Duration  12 weeks    PT Treatment/Interventions  ADLs/Self Care Home Management;Therapeutic exercise;Therapeutic activities;Functional mobility training;Stair training;Gait training;DME Instruction;Balance training;Neuromuscular re-education;Patient/family education;Orthotic Fit/Training;Wheelchair mobility training;Energy conservation;Passive range of motion    PT Next Visit Plan  continue gait with RW. tall kneeling when appropriate and have a 2nd set of hands, NMR for weight shifting and stance on RLE. sit <> stands from higher mat table, mini squats. weight shifting toward L and stepping with RLE.  Use mirror for visual cue for wt shifting. Add to HEP when appropriate.    PT Home Exercise Plan  [id number]    Consulted and Agree with Plan of Care  Patient;Family member/caregiver    Family Member Consulted  dad, Jakylan       Patient will benefit from skilled therapeutic intervention in order to improve the following deficits and impairments:  Abnormal gait, Decreased activity tolerance, Decreased balance, Decreased cognition, Decreased coordination, Decreased safety awareness, Decreased range of motion, Decreased mobility, Difficulty walking, Decreased strength,  Decreased endurance, Impaired tone  Visit Diagnosis: Muscle weakness (generalized)  Unsteadiness on feet  Other symptoms and signs involving the nervous system     Problem List Patient Active Problem List   Diagnosis Date Noted  . Goals of care, counseling/discussion 12/16/2018  . Palliative care by specialist   . 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 Spastic hemiparesis ([place])   . Cerebral edema (HCC)   . Intracranial tumor ([place])   . Steroid-induced hyperglycemia   . Spastic hemiplegia affecting nondominant side ([place])   . Hyponatremia   . Transaminitis   . Leucocytosis   . Right spastic hemiplegia ([place]) 10/21/2018  . Dysphagia 10/21/2018  . 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 Brain mass   . ICH (intracerebral hemorrhage) ([place]) 10/13/2018    Arliss Journey, PT, DPT  01/28/2019, 8:41 AM  [facility] [address] [place], [place], [address] Phone: [phone]   Fax:  [phone]  Name: KAYTON DUNAJ MRN: [id number] Date of Birth: August 08, 1995

## 2019-01-29 NOTE — Telephone Encounter (Signed)
Dr. Ranell Patrick (PM+R) discontinued amantadine on 01/14/19 per the chart.  Ventura Sellers, MD

## 2019-01-30 ENCOUNTER — Ambulatory Visit: Payer: 59 | Admitting: Occupational Therapy

## 2019-01-30 ENCOUNTER — Ambulatory Visit: Payer: 59 | Admitting: Physical Therapy

## 2019-01-30 ENCOUNTER — Other Ambulatory Visit: Payer: Self-pay

## 2019-01-30 ENCOUNTER — Encounter: Payer: Self-pay | Admitting: Occupational Therapy

## 2019-01-30 ENCOUNTER — Ambulatory Visit: Payer: 59 | Admitting: Speech Pathology

## 2019-01-30 ENCOUNTER — Encounter: Payer: Self-pay | Admitting: Physical Therapy

## 2019-01-30 DIAGNOSIS — R29818 Other symptoms and signs involving the nervous system: Secondary | ICD-10-CM

## 2019-01-30 DIAGNOSIS — R41841 Cognitive communication deficit: Secondary | ICD-10-CM

## 2019-01-30 DIAGNOSIS — G8191 Hemiplegia, unspecified affecting right dominant side: Secondary | ICD-10-CM

## 2019-01-30 DIAGNOSIS — R2681 Unsteadiness on feet: Secondary | ICD-10-CM

## 2019-01-30 DIAGNOSIS — R471 Dysarthria and anarthria: Secondary | ICD-10-CM

## 2019-01-30 DIAGNOSIS — M6281 Muscle weakness (generalized): Secondary | ICD-10-CM

## 2019-01-30 DIAGNOSIS — R41842 Visuospatial deficit: Secondary | ICD-10-CM

## 2019-01-30 DIAGNOSIS — R4701 Aphasia: Secondary | ICD-10-CM

## 2019-01-30 DIAGNOSIS — R482 Apraxia: Secondary | ICD-10-CM

## 2019-01-30 NOTE — Therapy (Signed)
[facility] [address] [place], [place], [address] Phone: [phone]   Fax:  [phone]  Speech Language Pathology Treatment  Patient Details  Name: Gregory Moreno MRN: [id number] Date of Birth: [birth year] Referring Provider (SLP): Gregory Simons, MD   Encounter Date: 01/30/2019  End of Session - 01/30/19 1508    Visit Number  12    Number of Visits  25    Date for SLP Re-Evaluation  03/17/19    SLP Start Time  6962    SLP Stop Time   1445    SLP Time Calculation (min)  42 min    Activity Tolerance  Patient tolerated treatment well       No past medical history on file.  Past Surgical History:  Procedure Laterality Date  . CRANIOTOMY Left 10/13/2018   Procedure: LEFT CRANIOTOMY FOR TUMOR RESECTION;  Surgeon: Gregory Part, MD;  Location: [facility];  Service: Neurosurgery;  Laterality: Left;    There were no vitals filed for this visit.  Subjective Assessment - 01/30/19 1507    Subjective  "They graduated him last Friday" re: Gregory Moreno            ADULT SLP TREATMENT - 01/30/19 1414      General Information   Behavior/Cognition  Alert;Cooperative;Lethargic;Requires cueing;Decreased sustained attention      Treatment Provided   Treatment provided  Cognitive-Linquistic      Cognitive-Linquistic Treatment   Treatment focused on  Cognition    Skilled Treatment  Pt has been working on [employer]. He demonstrated what he has been doing. I suggested in name the category and picture description, I turned volume down and had pt read aloud the words. He required usual min to mod A. Suggested he do this at home as well, to faciliate speech and volume. In simple description task, pt required consitent questioning cues and semantic cues to verbalize 2 descriptive words. Mildly complex math/reasoning task with consistent max written and verbal cues.       Assessment / Recommendations / Plan   Plan  Continue with  current plan of care       SLP Education - 01/30/19 1507    Education Details  say the words on the screen when doing Talk Path    Person(s) Educated  Patient;Parent(s)    Methods  Explanation;Demonstration    Comprehension  Verbalized understanding;Returned demonstration;Verbal cues required       SLP Short Term Goals - 01/30/19 1508      SLP SHORT TERM GOAL #1   Title  pt will complete aphasia assessment of auditory comprehension and verbal expression within 1-2 sessions    Status  Achieved      SLP SHORT TERM GOAL #2   Title  pt will demo understanding of more complex commands (spoken) with 80% and occasional min cues over three sessions    Status  Not Met      SLP SHORT TERM GOAL #3   Title  pt will answer pertinent questions with at least 3 words    Status  Not Met      SLP [place] #4   Title  pt will demo sustained/selective attention necessary to complete efficacious speech therapy in 6 of the first 8 sessions    Baseline  12-31-18, 01-13-19    Period  --   or 8 sessions   Status  Partially Met       SLP Long Term Goals -  01/30/19 1508      SLP LONG TERM GOAL #1   Title  pt will demo understanding of commands (spoken) with 85% and rare min cues over three sessions    Time  3    Period  Weeks   or 17 sessions, for all LTGs   Status  On-going      SLP LONG TERM GOAL #2   Title  pt will answer questions with [facility] verbal responses 75% of the time, in 3 sessions    Baseline  01-27-19    Time  3    Period  Weeks    Status  On-going      SLP LONG TERM GOAL #3   Title  pt will sustain loud /a/ with average mid 70s dB over 4 sessions    Time  5    Period  Weeks    Status  Deferred   due to focus on language     SLP LONG TERM GOAL #4   Title  pt will engage in simple conversation for 5 minutes with average loudness mid 60s dB over 3 sessions    Time  5    Period  Weeks    Status  Deferred   due to focus on language      Plan - 01/30/19 1508     Clinical Impression Statement  Pt with mod Broca's aphasia, dysarthria/voice, and cognitive communication deficits. 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 Pt progressing with attention and will get started on TalkPath Therapy this week as mom stated pt was on his phone playing some games earlier this week. Pt would cont to benefit from skilled ST focusing on primarily receptive and expressive language but also at some point in rehab process focus on speech loudness and cognitive communication skills. Pt cont to require skilled ST focusing primarily on language skills.       Patient will benefit from skilled therapeutic intervention in order to improve the following deficits and impairments:   Cognitive communication deficit  Aphasia  Dysarthria and anarthria    Problem List Patient Active Problem List   Diagnosis Date Noted  . Goals of care, counseling/discussion 12/16/2018  . Palliative care by specialist   . 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 Spastic hemiparesis ([place])   . Cerebral edema (HCC)   . Intracranial tumor ([place])   . Steroid-induced hyperglycemia   . Spastic hemiplegia affecting nondominant side ([place])   . Hyponatremia   . Transaminitis   . Leucocytosis   . Right spastic hemiplegia ([place]) 10/21/2018  . Dysphagia 10/21/2018  . 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 Brain mass   . ICH (intracerebral hemorrhage) ([place]) 10/13/2018    [name], Annye Rusk MS, CCC-SLP 01/30/2019, 3:09 PM  [facility] [address] [place], [place], [address] Phone: [phone]   Fax:  [phone]   Name: Gregory Moreno MRN: [id number] Date of Birth: 1995/07/10

## 2019-01-30 NOTE — Therapy (Signed)
[facility] [address] [place], [place], [address] Phone: [phone]   Fax:  [phone]  Occupational Therapy Treatment  Patient Details  Name: EDWORD PRIDGEON MRN: [id number] Date of Birth: Nov 18, 1995 Referring Provider (OT): Marlowe Shores   Encounter Date: 01/30/2019  OT End of Session - 01/30/19 1627    Visit Number  13    Number of Visits  25    Date for OT Re-Evaluation  03/17/19    OT Start Time  [id number]    OT Stop Time  1615    OT Time Calculation (min)  45 min    Activity Tolerance  Patient tolerated treatment well    Behavior During Therapy  Anxious   Tearful at times      History reviewed. No pertinent past medical history.  Past Surgical History:  Procedure Laterality Date  . CRANIOTOMY Left 10/13/2018   Procedure: LEFT CRANIOTOMY FOR TUMOR RESECTION;  Surgeon: Judith Part, MD;  Location: [facility];  Service: Neurosurgery;  Laterality: Left;    There were no vitals filed for this visit.                OT Treatments/Exercises (OP) - 01/30/19 0001      Neurological Re-education Exercises   Other Exercises 1  Neuromuscular reeducation to address weight bearing through RUE/RLE in standing at elevated surface.  Patient with increased tremors (clonus) in BLE noteable today.  Patient able to increase activation for elbow extension in forced use condition e.g. working from modified plantigrade to three point.  Followed with guided reach forward  pattenrs    Other Exercises 2  In standing worked on grasp / release in [place].  Patient has difficulty in use of RUE and RLE simulataneously.  With increased emphasis on RUE - needed increased support and alignment in trunk and RLE.                 OT Short Term Goals - 01/30/19 1630      OT SHORT TERM GOAL #2   Title  Patient will reach forward with shoulder flexion, elbow extension pattern to make contact with target in midline and 8-12'  from chest with intermittent assist    Status  Achieved      OT SHORT TERM GOAL #3   Title  Patient will transfer from wheelchair to commode with no more than supervision assistance - transferring either toward right or toward left side    Status  Achieved      OT SHORT TERM GOAL #4   Title  Patient will grasp and release a cylindrical object 2-3" in diameter with min assist    Status  Achieved      OT SHORT TERM GOAL #5   Title  Patient will dress lower body with mod assist    Status  Achieved        OT Long Term Goals - 01/30/19 1630      OT LONG TERM GOAL #1   Title  Patient will dress himself with no greater than set up assistance    Status  On-going      OT LONG TERM GOAL #2   Title  Patient will shower with supervision    Status  On-going      OT LONG TERM GOAL #3   Title  Patient will cut food on plate with modified independence    Status  On-going      OT LONG TERM GOAL #  4   Title  Patient will demonstrate ability to retrieve a lightweight item (less than 2lb) from waist height or lower and transport 6-12 inches laterally with RUE    Status  On-going      OT LONG TERM GOAL #5   Title  Patient will demonstrate RUE  mid level reach with minimal LUE support to hit a target at chest height directly in front of body    Status  On-going            Plan - 01/30/19 1628    Clinical Impression Statement  Patient continues to show steady imrvement with functional mobility and active movement in RUE.  Patient now with clonus in BLE.    OT Frequency  2x / week    OT Duration  12 weeks    OT Treatment/Interventions  Self-care/ADL training;DME and/or AE instruction;Splinting;Balance training;Aquatic Therapy;Therapeutic activities;Therapeutic exercise;Cognitive remediation/compensation;Neuromuscular education;Functional Mobility Training;Visual/perceptual remediation/compensation;Electrical Stimulation;Manual Therapy;Patient/family education    Plan  NMR RUE/TRUNK,  control of more than one joint - attempt some functional use (forced use)    Consulted and Agree with Plan of Care  Patient;Family member/caregiver    Family Member Consulted  Dad       Patient will benefit from skilled therapeutic intervention in order to improve the following deficits and impairments:           Visit Diagnosis: 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 Goals of care, counseling/discussion 12/16/2018  . Palliative care by specialist   . Glioblastoma multiforme of temporal lobe ([facility]) 11/11/2018  . Spastic hemiparesis ([facility])   . Cerebral edema ([facility])   . Intracranial tumor ([facility])   . Steroid-induced hyperglycemia   . Spastic hemiplegia affecting nondominant side ([facility])   . Hyponatremia   . Transaminitis   . Leucocytosis   . Right spastic hemiplegia ([facility]) 10/21/2018  . Dysphagia 10/21/2018  . 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 Brain mass   . ICH (intracerebral hemorrhage) ([facility]) 10/13/2018    Mariah Milling, OTR/L 01/30/2019, 4:32 PM  [facility] [address] [place], [place], [address] Phone: [phone]   Fax:  [phone]  Name: KEMARIAN AREVALO MRN: [id number] Date of Birth: [birth year]

## 2019-01-31 NOTE — Therapy (Signed)
[facility] [address] [place], [place], [address] Phone: [phone]   Fax:  [phone]  Physical Therapy Treatment  Patient Details  Name: Gregory Moreno MRN: [id number] Date of Birth: 1995/05/30 Referring Provider (PT): Alger Simons, MD   Encounter Date: 01/30/2019  PT End of Session - 01/31/19 1817    Visit Number  13    Number of Visits  25    Authorization Type  [employer] - $25 co pay - collect 1 copay, VL: 30 combined (PT/OT/ST), counts as 1 as done on same day    Authorization - Visit Number  3    Authorization - Number of Visits  30    PT Start Time  1450    PT Stop Time  1531    PT Time Calculation (min)  41 min    Equipment Utilized During Treatment  Gait belt    Activity Tolerance  Patient limited by pain;Patient tolerated treatment well    Behavior During Therapy  Anxious   tearful at times.      History reviewed. No pertinent past medical history.  Past Surgical History:  Procedure Laterality Date  . CRANIOTOMY Left 10/13/2018   Procedure: LEFT CRANIOTOMY FOR TUMOR RESECTION;  Surgeon: Judith Part, MD;  Location: [facility];  Service: Neurosurgery;  Laterality: Left;    There were no vitals filed for this visit.  Subjective Assessment - 01/30/19 1454    Subjective  Has been doing good. No falls. Doing a little walking at home.    Limitations  Sitting;Walking;Standing    How long can you walk comfortably?  100' with hemiwalker    Patient Stated Goals  wants to improve his walking, get back to normal    Currently in Pain?  No/denies                       [facility] Adult PT Treatment/Exercise - 01/31/19 0001      Transfers   Sit to Stand  4: Min guard    Sit to Stand Details  Verbal cues for technique;Visual cues/gestures for sequencing;Verbal cues for sequencing    Stand to Sit  4: Min guard;4: Min assist    Stand to Sit Details  min A at times to facilitate hip flexion when  sitting to mat table, wheel chair     Comments  from w/c - stand step transfer to mat table (facing mat table) with mod A of PT/OT for weight shifting and balance, needed verbal cues for foot placement as well as manual placement of R foot at times to perform turns      Ambulation/Gait   Ambulation/Gait  Yes    Ambulation/Gait Assistance  3: Mod assist    Ambulation/Gait Assistance Details  with no AD: PT on stool at pt's RLE to help assist with foot placement, quad activation tactile cues in mid stance, and weight shifting with OT at pt's L with pt's arm wrapped around for balance and performing manual cues at trunk for upright posture and standing tall during stance phase. Pt taking improved steps today with cues for standing tall and quad activation/weight shift before stepping with LLE. Mirror in front of pt for visual cues. Pt continuing to demonstrate a narrow step with RLE and at times needs therapist assistance for placement    Ambulation Distance (Feet)  50 Feet    Assistive device  None;Other (Comment)   assist of PT/OT   Gait Pattern  Decreased stance time - right;Decreased step length - left;Decreased weight shift to right;Decreased hip/knee flexion - right;Right genu recurvatum;Trunk flexed;Narrow base of support;Poor foot clearance - right;Step-through pattern;Decreased dorsiflexion - right    Ambulation Surface  Level;Indoor      Neuro Re-ed    Neuro Re-ed Details   Attempted tall kneeling with mod A of OT/PT to help facilitate pt into position, assist to bring RLE up onto mat table. Had pt come up onto bench on his elbows - cues to stand tall on knees, pt immediately become tearful and stating that he feels discomfort in the front of his thighs. Sat pt down with mod A at edge of mat, with discomfort beginning to subside after getting out of tall kneel position. Facing mat at an elevated height with mod A x2 for balance and weight shifting - standing onto RLE tapping L knee to mat and  then back down. Verbal and tactile cues for quad activation and posture for weight shifting before bringing L knee up - multiple reps. Pt with increased tremors of BLE when fatigued.              PT Education - 01/31/19 1815    Education Details  following up with physician regarding increased BLE tremors during session today and at home.    Person(s) Educated  Patient;Parent(s)    Methods  Explanation    Comprehension  Verbalized understanding       PT Short Term Goals - 01/14/19 1122      PT SHORT TERM GOAL #1   Title  Patient and pt's family will be independent with ongoing HEP designed to improve movement and strength in RLE.  ALL STGS DUE 02/11/19    Time  4    Period  Weeks    Status  On-going    Target Date  02/11/19      PT SHORT TERM GOAL #2   Title  Patient will stand at countertop for at least 8 minutes with supervision to increase tolerance for ADLs.    Baseline  did not have time to formally assess on 01/13/19    Period  Weeks    Status  On-going      PT SHORT TERM GOAL #3   Title  Pt will perform stand pivot vs. squat pivot transfer to either R or L from wc to mat table with mod I  in order to decrease caregiver burden.    Baseline  needs initially cueing for technique, min guard for balance for squat pivot transfer    Time  4    Period  Weeks    Status  Revised      PT SHORT TERM GOAL #4   Title  Pt will perform all bed mobility with supervision in order to decrease caregiver burden.    Baseline  needs min A to assist with rolling and for balance for performing sidelying > seated at edge of bed.    Time  4    Period  Weeks    Status  Revised      PT SHORT TERM GOAL #5   Title  Pt will ambulate at least 82' with RW, R AFO and min guard in order to improve household mobility.    Baseline  72' with RW, R AFO and R hand orthosis with min/mod A for balance and RW navigation.    Time  4    Period  Weeks    Status  Not Met  PT Long Term Goals -  12/18/18 1827      PT LONG TERM GOAL #1   Title  Patient and pt's family will be independent with final HEP designed to improve movement and strength in RLE.  ALL LTGS DUE 03/12/19    Time  12    Period  Weeks    Status  New    Target Date  03/12/19      PT LONG TERM GOAL #2   Title  Pt will ambulate at least 300' with supervision and hemiwalker vs. LRAD in order to improve community mobility.    Time  12    Period  Weeks    Status  New      PT LONG TERM GOAL #3   Title  Pt will perform at least 10 sit <> stands from mat table with proper technique with no cueing and mod I in order to improve functional transfers and demo improved LE strength.    Time  12    Period  Weeks    Status  New      PT LONG TERM GOAL #4   Title  Pt will perform all bed mobility from level mat table with mod I in order to decrease caregiver burden.    Time  12    Period  Weeks    Status  New      PT LONG TERM GOAL #5   Title  Pt will perform 4 steps with step to pattern and single handrail with supervision.    Time  12    Period  Weeks    Status  New            Plan - 01/31/19 1818    Clinical Impression Statement  Focus of today's skilled session was NMR for weight shifting towards RLE and quad activation and gait training. Attempted tall kneeling at start of session with assist of 2 therapists (PT,OT) - once in position with elbows on bench, pt began to be very tearful (was uncomfortable in position, felt discomfort/tightness in his quads), unable to perform and provided emotional support to pt. Pt with increased BLE tremors/clonus during today's session. Pt with improved R knee stance control during gait today when ambulating with 2 therapists and no AD. Will continue to progress towards LTGs.    Personal Factors and Comorbidities  --   pt ungergoing chemo/radiation   Examination-Activity Limitations  Bed Mobility;Locomotion Level;Sit;Squat;Stairs;Stand;Transfers    Examination-Participation  Restrictions  Community Activity;School;Driving    Stability/Clinical Decision Making  Evolving/Moderate complexity    Rehab Potential  Good    PT Frequency  2x / week    PT Duration  12 weeks    PT Treatment/Interventions  ADLs/Self Care Home Management;Therapeutic exercise;Therapeutic activities;Functional mobility training;Stair training;Gait training;DME Instruction;Balance training;Neuromuscular re-education;Patient/family education;Orthotic Fit/Training;Wheelchair mobility training;Energy conservation;Passive range of motion    PT Next Visit Plan  continue gait with RW (begin gait training with pt's mom and she is fearful to walk pt at home), hip flexor/quad stretching. tall kneeling when appropriate and have a 2nd set of hands,  sit <> stands from higher mat table,mini squat holds w/ weight shifting.  NMR for R weight shift (steps with LLE, step taps to 2"/4" step) Add to HEP when appropriate.    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 Goals of care, counseling/discussion 12/16/2018  . Palliative care by specialist   . Glioblastoma multiforme of temporal lobe ([facility]) 11/11/2018  . Spastic hemiparesis  ([facility])   . Cerebral edema ([facility])   . Intracranial tumor ([facility])   . Steroid-induced hyperglycemia   . Spastic hemiplegia affecting nondominant side ([facility])   . Hyponatremia   . Transaminitis   . Leucocytosis   . Right spastic hemiplegia ([facility]) 10/21/2018  . Dysphagia 10/21/2018  . 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 Brain mass   . ICH (intracerebral hemorrhage) ([employer]) 10/13/2018    Arliss Journey, PT, DPT  01/31/2019, 8:20 PM  [facility] [address] [place], [place], [address] Phone: [phone]   Fax:  [phone]  Name: Gregory Moreno MRN: [id number] Date of Birth: [birth year]

## 2019-02-02 ENCOUNTER — Encounter: Payer: Self-pay | Admitting: Occupational Therapy

## 2019-02-02 ENCOUNTER — Other Ambulatory Visit: Payer: Self-pay

## 2019-02-02 ENCOUNTER — Ambulatory Visit: Payer: 59 | Admitting: Occupational Therapy

## 2019-02-02 DIAGNOSIS — M6281 Muscle weakness (generalized): Secondary | ICD-10-CM | POA: Diagnosis not present

## 2019-02-02 DIAGNOSIS — G8191 Hemiplegia, unspecified affecting right dominant side: Secondary | ICD-10-CM

## 2019-02-02 DIAGNOSIS — R482 Apraxia: Secondary | ICD-10-CM

## 2019-02-02 DIAGNOSIS — R41842 Visuospatial deficit: Secondary | ICD-10-CM

## 2019-02-02 DIAGNOSIS — R29818 Other symptoms and signs involving the nervous system: Secondary | ICD-10-CM

## 2019-02-02 DIAGNOSIS — R2681 Unsteadiness on feet: Secondary | ICD-10-CM

## 2019-02-02 NOTE — Therapy (Signed)
[facility] [address] [place], [place], [address] Phone: [phone]   Fax:  [phone]  Occupational Therapy Treatment  Patient Details  Name: Gregory Moreno MRN: [id number] Date of Birth: [birth year] Referring Provider (OT): Marlowe Shores   Encounter Date: 02/02/2019  OT End of Session - 02/02/19 1806    Visit Number  14    Number of Visits  25    Date for OT Re-Evaluation  03/17/19    OT Start Time  1703    OT Stop Time  1750    OT Time Calculation (min)  47 min    Activity Tolerance  Patient tolerated treatment well    Behavior During Therapy  [facility] for tasks assessed/performed       History reviewed. No pertinent past medical history.  Past Surgical History:  Procedure Laterality Date  . CRANIOTOMY Left 10/13/2018   Procedure: LEFT CRANIOTOMY FOR TUMOR RESECTION;  Surgeon: Judith Part, MD;  Location: [facility];  Service: Neurosurgery;  Laterality: Left;    There were no vitals filed for this visit.  Subjective Assessment - 02/02/19 1711    Subjective   Got rid of the hospital bed    Patient is accompanied by:  Family member    Currently in Pain?  No/denies    Pain Score  0-No pain                   OT Treatments/Exercises (OP) - 02/02/19 0001      Neurological Re-education Exercises   Other Exercises 1  Neuromuscular reeducation to address sit to stand transition and stand balance without use of arms.  Patient able to stand without support for several seconds, even able to turn head toward left without loss of balance.  Worked on functional forward reach, grasp, and release in standing to interact with small objects - with emphasis on pinch and release.  Patient beter able to release - although with increased effort has more difficulty as muscle tension increases.      Other Exercises 2  Worked in modified plantigrade to address weight bearing and activation into surface with tricep  press.  Patient able to actively straighten elbow today with only minimal facilitation.  Spoke with mom about medication to control muscle tension.  Mom indicated that she has option to use Baclofen as needed, so she started giving 5 mg, and has noted less tremors in legs.                 OT Short Term Goals - 01/30/19 1630      OT SHORT TERM GOAL #2   Title  Patient will reach forward with shoulder flexion, elbow extension pattern to make contact with target in midline and 8-12' from chest with intermittent assist    Status  Achieved      OT SHORT TERM GOAL #3   Title  Patient will transfer from wheelchair to commode with no more than supervision assistance - transferring either toward right or toward left side    Status  Achieved      OT SHORT TERM GOAL #4   Title  Patient will grasp and release a cylindrical object 2-3" in diameter with min assist    Status  Achieved      OT SHORT TERM GOAL #5   Title  Patient will dress lower body with mod assist    Status  Achieved        OT Long  Term Goals - 01/30/19 1630      OT LONG TERM GOAL #1   Title  Patient will dress himself with no greater than set up assistance    Status  On-going      OT LONG TERM GOAL #2   Title  Patient will shower with supervision    Status  On-going      OT LONG TERM GOAL #3   Title  Patient will cut food on plate with modified independence    Status  On-going      OT LONG TERM GOAL #4   Title  Patient will demonstrate ability to retrieve a lightweight item (less than 2lb) from waist height or lower and transport 6-12 inches laterally with RUE    Status  On-going      OT LONG TERM GOAL #5   Title  Patient will demonstrate RUE  mid level reach with minimal LUE support to hit a target at chest height directly in front of body    Status  On-going            Plan - 02/02/19 1807    Clinical Impression Statement  Patient with steady improvement in attention to right UE, stand balance, and  activity tolerance    OT Frequency  2x / week    OT Duration  12 weeks    OT Treatment/Interventions  Self-care/ADL training;DME and/or AE instruction;Splinting;Balance training;Aquatic Therapy;Therapeutic activities;Therapeutic exercise;Cognitive remediation/compensation;Neuromuscular education;Functional Mobility Training;Visual/perceptual remediation/compensation;Electrical Stimulation;Manual Therapy;Patient/family education    Plan  NMR RUE/TRUNK, control of more than one joint - attempt some functional use (forced use)    Consulted and Agree with Plan of Care  Patient;Family member/caregiver    Family Member Consulted  Mom       Patient will benefit from skilled therapeutic intervention in order to improve the following deficits and impairments:           Visit Diagnosis: 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 Goals of care, counseling/discussion 12/16/2018  . Palliative care by specialist   . Glioblastoma multiforme of temporal lobe ([facility]) 11/11/2018  . Spastic hemiparesis ([facility])   . Cerebral edema ([facility])   . Intracranial tumor ([facility])   . Steroid-induced hyperglycemia   . Spastic hemiplegia affecting nondominant side ([facility])   . Hyponatremia   . Transaminitis   . Leucocytosis   . Right spastic hemiplegia ([facility]) 10/21/2018  . Dysphagia 10/21/2018  . 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 Brain mass   . ICH (intracerebral hemorrhage) ([facility]) 10/13/2018    Mariah Milling, OTR/L 02/02/2019, 6:08 PM  [facility] [address] [place], [place], [address] Phone: [phone]   Fax:  [phone]  Name: Gregory Moreno MRN:  [id number] Date of Birth: 02-23-1995

## 2019-02-03 ENCOUNTER — Ambulatory Visit: Payer: 59

## 2019-02-03 ENCOUNTER — Encounter: Payer: 59 | Admitting: Occupational Therapy

## 2019-02-03 ENCOUNTER — Ambulatory Visit: Payer: 59 | Admitting: Physical Therapy

## 2019-02-03 DIAGNOSIS — R4701 Aphasia: Secondary | ICD-10-CM

## 2019-02-03 DIAGNOSIS — R471 Dysarthria and anarthria: Secondary | ICD-10-CM

## 2019-02-03 DIAGNOSIS — M6281 Muscle weakness (generalized): Secondary | ICD-10-CM

## 2019-02-03 DIAGNOSIS — R29818 Other symptoms and signs involving the nervous system: Secondary | ICD-10-CM

## 2019-02-03 DIAGNOSIS — R41841 Cognitive communication deficit: Secondary | ICD-10-CM

## 2019-02-03 DIAGNOSIS — G8191 Hemiplegia, unspecified affecting right dominant side: Secondary | ICD-10-CM

## 2019-02-03 DIAGNOSIS — R2681 Unsteadiness on feet: Secondary | ICD-10-CM

## 2019-02-03 NOTE — Therapy (Signed)
[facility] [address] [place], [place], [address] Phone: [phone]   Fax:  [phone]  Speech Language Pathology Treatment  Patient Details  Name: Gregory Moreno MRN: [id number] Date of Birth: [birth year] Referring Provider (SLP): Alger Simons, MD   Encounter Date: 02/03/2019  End of Session - 02/03/19 1647    Visit Number  13    Number of Visits  25    Date for SLP Re-Evaluation  03/17/19    SLP Start Time  7903    SLP Stop Time   1615    SLP Time Calculation (min)  42 min    Activity Tolerance  Patient tolerated treatment well       No past medical history on file.  Past Surgical History:  Procedure Laterality Date  . CRANIOTOMY Left 10/13/2018   Procedure: LEFT CRANIOTOMY FOR TUMOR RESECTION;  Surgeon: Judith Part, MD;  Location: [facility];  Service: Neurosurgery;  Laterality: Left;    There were no vitals filed for this visit.         ADULT SLP TREATMENT - 02/03/19 1641      General Information   Behavior/Cognition  Alert;Cooperative;Lethargic;Requires cueing;Decreased sustained attention      Treatment Provided   Treatment provided  Cognitive-Linquistic      Cognitive-Linquistic Treatment   Skilled Treatment  Pt reports cont working on Talk Path. SLP worked with pt on recall the picture and turned volume down requiring pt to name the item/picture. Pt req'd mod A occasionally. Aphasic errors noted (e.g., "chair" for "table"), without awareness. SLP encouraged pt to cont to complete at home to faciliate language skills and improve volume. Pt req'd consistent max (written, drawing, and verbal cues) to figure therapy start times given 45 minute length. Max cues needed for auditory directions re written information.       Assessment / Recommendations / Plan   Plan  Continue with current plan of care      Progression Toward Goals   Progression toward goals  Progressing toward goals          SLP Short Term Goals - 01/30/19 1508      SLP SHORT TERM GOAL #1   Title  pt will complete aphasia assessment of auditory comprehension and verbal expression within 1-2 sessions    Status  Achieved      SLP SHORT TERM GOAL #2   Title  pt will demo understanding of more complex commands (spoken) with 80% and occasional min cues over three sessions    Status  Not Met      SLP SHORT TERM GOAL #3   Title  pt will answer pertinent questions with at least 3 words    Status  Not Met      SLP SHORT TERM GOAL #4   Title  pt will demo sustained/selective attention necessary to complete efficacious speech therapy in 6 of the first 8 sessions    Baseline  12-31-18, 01-13-19    Period  --   or 8 sessions   Status  Partially Met       SLP Long Term Goals - 02/03/19 1648      SLP LONG TERM GOAL #1   Title  pt will demo understanding of commands (spoken) with 85% and rare min cues over three sessions    Time  2    Period  Weeks   or 17 sessions, for all LTGs   Status  On-going  SLP LONG TERM GOAL #2   Title  pt will answer questions with [facility] verbal responses 75% of the time, in 3 sessions    Baseline  01-27-19    Time  2    Period  Weeks    Status  On-going      SLP LONG TERM GOAL #3   Title  pt will sustain loud /a/ with average mid 70s dB over 4 sessions    Time  5    Period  Weeks    Status  Deferred   due to focus on language     SLP LONG TERM GOAL #4   Title  pt will engage in simple conversation for 5 minutes with average loudness mid 60s dB over 3 sessions    Time  5    Period  Weeks    Status  Deferred   due to focus on language      Plan - 02/03/19 1648    Clinical Impression Statement  Pt with mod Broca's aphasia, dysarthria/voice, and cognitive communication deficits. 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 Pt would cont to benefit from skilled ST focusing on primarily receptive and expressive language but  also at some point in rehab process focus on speech loudness and cognitive communication skills. Pt cont to require skilled ST focusing primarily on language skills.       Patient will benefit from skilled therapeutic intervention in order to improve the following deficits and impairments:   Aphasia  Cognitive communication deficit  Dysarthria and anarthria    Problem List Patient Active Problem List   Diagnosis Date Noted  . Goals of care, counseling/discussion 12/16/2018  . Palliative care by specialist   . Glioblastoma multiforme of temporal lobe ([place] [facility]) 11/11/2018  . Spastic hemiparesis ([facility])   . Cerebral edema (HCC)   . Intracranial tumor ([place])   . Steroid-induced hyperglycemia   . Spastic hemiplegia affecting nondominant side ([place])   . Hyponatremia   . Transaminitis   . Leucocytosis   . Right spastic hemiplegia ([facility]) 10/21/2018  . Dysphagia 10/21/2018  . 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 Brain mass   . ICH (intracerebral hemorrhage) ([facility]) 10/13/2018    [facility] ,[place], [place]  02/03/2019, 4:50 PM  [facility] [address] [place], [place], [address] Phone: [phone]   Fax:  [phone]   Name: Gregory Moreno MRN: [id number] Date of Birth: Jul 17, 1995

## 2019-02-03 NOTE — Therapy (Signed)
[facility] [address] [place], [place], [address] Phone: [phone]   Fax:  [phone]  Physical Therapy Treatment  Patient Details  Name: Gregory Moreno MRN: [id number] Date of Birth: 03-19-1995 Referring Provider (PT): Alger Simons, MD   Encounter Date: 02/03/2019  PT End of Session - 02/03/19 1757    Visit Number  14    Number of Visits  25    Authorization Type  [employer] - $25 co pay - collect 1 copay, VL: 30 combined (PT/OT/ST), counts as 1 as done on same day    Authorization - Visit Number  4    Authorization - Number of Visits  30    PT Start Time  1700    PT Stop Time  0017    PT Time Calculation (min)  49 min    Equipment Utilized During Treatment  Gait belt    Activity Tolerance  Patient tolerated treatment well    Behavior During Therapy  Anxious;WFL for tasks assessed/performed       No past medical history on file.  Past Surgical History:  Procedure Laterality Date  . CRANIOTOMY Left 10/13/2018   Procedure: LEFT CRANIOTOMY FOR TUMOR RESECTION;  Surgeon: Judith Part, MD;  Location: [facility];  Service: Neurosurgery;  Laterality: Left;    There were no vitals filed for this visit.  Subjective Assessment - 02/03/19 1704    Subjective  Doing good. No falls. Now sleeping in his own bed.    Limitations  Sitting;Walking;Standing    How long can you walk comfortably?  100' with hemiwalker    Patient Stated Goals  wants to improve his walking, get back to normal    Currently in Pain?  No/denies                       [facility] Adult PT Treatment/Exercise - 02/03/19 2108      Transfers   Transfers  Sit to Stand;Stand to Sit    Sit to Stand  4: Min guard    Sit to Stand Details  Verbal cues for technique;Visual cues/gestures for sequencing;Verbal cues for sequencing    Sit to Stand Details (indicate cue type and reason)  multiple reps throughout session from w/c using LUE to help  push up and from mat table     Stand to Sit  4: Min guard    Stand to Sit Details (indicate cue type and reason)  Tactile cues for weight shifting;Tactile cues for initiation;Tactile cues for placement;Tactile cues for weight beaing;Visual cues for safe use of DME/AE    Stand to Sit Details  cues to reach posteriorly back to w/c before sitting back down with LUE, pt with poor eccentric control when fatigued.     Transfer Cueing  Stand step transfer from w/c <> mat table - initially transferring to pt's stronger LLE, and then coming back transferring to R - cues for foot placement and weight shifting, min A from therapist while performing     Comments  Bed mobility: performed from elevated mat table to mimic pt's bed at home (pt no longer sleeping in hospital bed) - pt to demonstrate how he gets into bed from sitting > L sidelying > supine. Pt apraxic and has difficulty motor planning on how to lay down without assistance from therapist for cueing for sequencing. From supine positioning - placed sheet behind pt's trunk, with therapist holding sheet helped facilitate upper trunk movement for rolling  and neck flexion to initiate rolling and core/trunk activation, with isometric holds and eccentric control back down to mat x5 reps. Pt able to perform supine > L sidelying > sit with close supervision for balance. Also practiced sitting > R sidelying > supine and supine > R sidelying > sit. Pt needing visual cues for supine > R sidelying with therapist asking pt to reach towards her hand with pt L hand to facilitate rolling. Pt very fearful and anxious in R sidelying position and needed mod A to come back up to sitting.       Ambulation/Gait   Ambulation/Gait  Yes    Ambulation/Gait Assistance  4: Min assist    Ambulation/Gait Assistance Details  Verbal and tactile cues to stand tall on RLE during stance phase and for RLE activation for LLE step length and to decr genu recurvatum, therapist providing slight min  A at times posteriorly to RLE to prevent recurvatum - pt with less episodes today during gait. Needed visual cues from theraband on RW for step width on RLE, as pt initially took a more narrow and ADDucted step. responded well to visual cue    Ambulation Distance (Feet)  55 Feet    Assistive device  Rolling walker;Other (Comment)   R hand orthosis, R AFO   Gait Pattern  Decreased stance time - right;Decreased step length - left;Decreased weight shift to right;Decreased hip/knee flexion - right;Right genu recurvatum;Trunk flexed;Narrow base of support;Poor foot clearance - right;Step-through pattern;Decreased dorsiflexion - right    Ambulation Surface  Level;Indoor      Neuro Re-ed    Neuro Re-ed Details   Standing at staircase with LUE support and therapist providing min/mod A for balance and weight shifting - shifting weight to RLE, with tactile and verbal cues for R quad activation 2 x 6 reps LLE step up taps to 6" step for increased weight bearing and shifting to RLE      Exercises   Exercises  Other Exercises    Other Exercises   Off edge of mat table - R hip flexor stretch, 3 x 45 seconds, with therapist providing support. With RLE off mat table on 6" step: worked on hip flexion bringing RLE back up to elevated mat table, pt able to perform against gravity.                PT Short Term Goals - 01/14/19 1122      PT SHORT TERM GOAL #1   Title  Patient and pt's family will be independent with ongoing HEP designed to improve movement and strength in RLE.  ALL STGS DUE 02/11/19    Time  4    Period  Weeks    Status  On-going    [place] Date  02/11/19      PT SHORT TERM GOAL #2   Title  Patient will stand at countertop for at least 8 minutes with supervision to increase tolerance for ADLs.    Baseline  did not have time to formally assess on 01/13/19    Period  Weeks    Status  On-going      PT SHORT TERM GOAL #3   Title  Pt will perform stand pivot vs. squat pivot transfer to  either R or L from wc to mat table with mod I  in order to decrease caregiver burden.    Baseline  needs initially cueing for technique, min guard for balance for squat pivot transfer    Time  4    Period  Weeks    Status  Revised      PT SHORT TERM GOAL #4   Title  Pt will perform all bed mobility with supervision in order to decrease caregiver burden.    Baseline  needs min A to assist with rolling and for balance for performing sidelying > seated at edge of bed.    Time  4    Period  Weeks    Status  Revised      PT SHORT TERM GOAL #5   Title  Pt will ambulate at least 48' with RW, R AFO and min guard in order to improve household mobility.    Baseline  71' with RW, R AFO and R hand orthosis with min/mod A for balance and RW navigation.    Time  4    Period  Weeks    Status  Not Met        PT Long Term Goals - 12/18/18 1827      PT LONG TERM GOAL #1   Title  Patient and pt's family will be independent with final HEP designed to improve movement and strength in RLE.  ALL LTGS DUE 03/12/19    Time  12    Period  Weeks    Status  New    [place] Date  03/12/19      PT LONG TERM GOAL #2   Title  Pt will ambulate at least 300' with supervision and hemiwalker vs. LRAD in order to improve community mobility.    Time  12    Period  Weeks    Status  New      PT LONG TERM GOAL #3   Title  Pt will perform at least 10 sit <> stands from mat table with proper technique with no cueing and mod I in order to improve functional transfers and demo improved LE strength.    Time  12    Period  Weeks    Status  New      PT LONG TERM GOAL #4   Title  Pt will perform all bed mobility from level mat table with mod I in order to decrease caregiver burden.    Time  12    Period  Weeks    Status  New      PT LONG TERM GOAL #5   Title  Pt will perform 4 steps with step to pattern and single handrail with supervision.    Time  12    Period  Weeks    Status  New            Plan -  02/03/19 1754    Clinical Impression Statement  Pt tolerated session well today - focusing on bed mobility, gait training, weight shifting to RLE at stair case. Pt with difficulty problem solving bed mobility without verbal or tactile cueing. Has increased fear of rolling towards his R side - needed mod A to come up into sitting from R sidelying. Pt with less tremors throughout today's session on RLE. Pt with less episode of genu recurvatum on RLE during gait today, had increased quad activation during standing weight shifting activities. Will continue to progress towards LTGs.    Personal Factors and Comorbidities  --   pt ungergoing chemo/radiation   Examination-Activity Limitations  Bed Mobility;Locomotion Level;Sit;Squat;Stairs;Stand;Transfers    Examination-Participation Restrictions  Community Activity;School;Driving    Stability/Clinical Decision Making  Evolving/Moderate complexity    Rehab Potential  Good    PT Frequency  2x / week    PT Duration  12 weeks    PT Treatment/Interventions  ADLs/Self Care Home Management;Therapeutic exercise;Therapeutic activities;Functional mobility training;Stair training;Gait training;DME Instruction;Balance training;Neuromuscular re-education;Patient/family education;Orthotic Fit/Training;Wheelchair mobility training;Energy conservation;Passive range of motion    PT Next Visit Plan  continue gait with RW (begin gait training with pt's mom and she is fearful to walk pt at home), hip flexor/quad stretching. tall kneeling when appropriate and have a 2nd set of hands,  sit <> stands from higher mat table,mini squat holds w/ weight shifting.  NMR for R weight shift (steps with LLE, step taps to 2"/4" step) Add to HEP when appropriate.    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 Goals of care, counseling/discussion 12/16/2018  . Palliative care by specialist   . Glioblastoma multiforme of temporal lobe ([facility]) 11/11/2018  . Spastic hemiparesis ([place])   . Cerebral edema (HCC)   . Intracranial tumor ([place])   . Steroid-induced hyperglycemia   . Spastic hemiplegia affecting nondominant side ([place])   . Hyponatremia   . Transaminitis   . Leucocytosis   . Right spastic hemiplegia ([facility] [place]) 10/21/2018  . Dysphagia 10/21/2018  . 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 Brain mass   . ICH (intracerebral hemorrhage) ([place]) 10/13/2018    Arliss Journey, PT, DPT  02/03/2019, 9:58 PM  [facility] [address] [place], [place], [address] Phone: [phone]   Fax:  [phone]  Name: Gregory Moreno MRN: [id number] Date of Birth: [birth year]

## 2019-02-06 ENCOUNTER — Ambulatory Visit (HOSPITAL_COMMUNITY)
Admission: RE | Admit: 2019-02-06 | Discharge: 2019-02-06 | Disposition: A | Discharge status: Home / Self Care | Payer: 59 | Source: Ambulatory Visit | Attending: Internal Medicine | Admitting: Internal Medicine

## 2019-02-06 ENCOUNTER — Ambulatory Visit: Payer: 59

## 2019-02-06 ENCOUNTER — Other Ambulatory Visit: Payer: Self-pay

## 2019-02-06 ENCOUNTER — Encounter: Payer: Self-pay | Admitting: Occupational Therapy

## 2019-02-06 ENCOUNTER — Ambulatory Visit: Payer: 59 | Admitting: Physical Therapy

## 2019-02-06 ENCOUNTER — Ambulatory Visit: Payer: 59 | Admitting: Occupational Therapy

## 2019-02-06 DIAGNOSIS — R2681 Unsteadiness on feet: Secondary | ICD-10-CM

## 2019-02-06 DIAGNOSIS — R41841 Cognitive communication deficit: Secondary | ICD-10-CM

## 2019-02-06 DIAGNOSIS — M6281 Muscle weakness (generalized): Secondary | ICD-10-CM

## 2019-02-06 DIAGNOSIS — R29818 Other symptoms and signs involving the nervous system: Secondary | ICD-10-CM

## 2019-02-06 DIAGNOSIS — G8191 Hemiplegia, unspecified affecting right dominant side: Secondary | ICD-10-CM

## 2019-02-06 DIAGNOSIS — R471 Dysarthria and anarthria: Secondary | ICD-10-CM

## 2019-02-06 DIAGNOSIS — R482 Apraxia: Secondary | ICD-10-CM

## 2019-02-06 DIAGNOSIS — C712 Malignant neoplasm of temporal lobe: Secondary | ICD-10-CM | POA: Insufficient documentation

## 2019-02-06 DIAGNOSIS — R4701 Aphasia: Secondary | ICD-10-CM

## 2019-02-06 DIAGNOSIS — R41842 Visuospatial deficit: Secondary | ICD-10-CM

## 2019-02-06 MED ORDER — GADOBUTROL 1 MMOL/ML IV SOLN
8.0000 mL | Freq: Once | INTRAVENOUS | Status: AC | PRN
  Administered 2019-02-06: 8 mL via INTRAVENOUS

## 2019-02-06 NOTE — Therapy (Signed)
[facility] [address] [place], [place], [address] Phone: [phone]   Fax:  [phone]  Speech Language Pathology Treatment  Patient Details  Name: Gregory Moreno MRN: [id number] Date of Birth: [birth year] Referring Provider (SLP): Alger Simons, MD   Encounter Date: 02/06/2019  End of Session - 02/06/19 1546    Visit Number  14    Number of Visits  25    Date for SLP Re-Evaluation  03/17/19    SLP Start Time  0240    SLP Stop Time   1446    SLP Time Calculation (min)  41 min    Activity Tolerance  Patient tolerated treatment well       No past medical history on file.  Past Surgical History:  Procedure Laterality Date  . CRANIOTOMY Left 10/13/2018   Procedure: LEFT CRANIOTOMY FOR TUMOR RESECTION;  Surgeon: Judith Part, MD;  Location: [facility];  Service: Neurosurgery;  Laterality: Left;    There were no vitals filed for this visit.  Subjective Assessment - 02/06/19 1535    Subjective  "Hi"    Patient is accompained by:  Family member   father   Currently in Pain?  No/denies            ADULT SLP TREATMENT - 02/06/19 1536      General Information   Behavior/Cognition  Alert;Cooperative;Lethargic;Requires cueing;Decreased sustained attention      Treatment Provided   Treatment provided  Cognitive-Linquistic      Cognitive-Linquistic Treatment   Treatment focused on  Cognition    Skilled Treatment  SLP used photo cards to demo a home task pt could do - seprating cards into simple categories. SLP had pt name the items on the cards (88% success) and spell 7 of them (max cues/total A consistently necessary). Pt reasoned category name with min-mod A, and placed card in the correct category 83% of the time. Accuracy improved as more cards were in the categories (visual cues). Pt sustained/selelctive attention observed to wane at times - once pt said, "could you repeat - I forgot" when SLP asked  him a question. Pt appears apraxic at times, telling SLP "I know (the name)," or "I know it" occasionally, as well as being able to tell SLP an item name easily and then 45 seconds later being unable to tell SLP the same item name.  Pt very difficult to hear most of the session today. Gibberish and perseverations were heard as responses approx 50% of the time. SLP highlighted to father that pt could do this same task at home with cutouts from a grocery/[employer] circular.       Assessment / Recommendations / Plan   Plan  Continue with current plan of care      Progression Toward Goals   Progression toward goals  Progressing toward goals       SLP Education - 02/06/19 1545    Education Details  home tasks    Person(s) Educated  Patient;Parent(s)    Methods  Explanation;Demonstration    Comprehension  Verbalized understanding;Need further instruction       SLP Short Term Goals - 01/30/19 1508      SLP SHORT TERM GOAL #1   Title  pt will complete aphasia assessment of auditory comprehension and verbal expression within 1-2 sessions    Status  Achieved      SLP SHORT TERM GOAL #2   Title  pt will demo understanding of more complex  commands (spoken) with 80% and occasional min cues over three sessions    Status  Not Met      SLP SHORT TERM GOAL #3   Title  pt will answer pertinent questions with at least 3 words    Status  Not Met      SLP SHORT TERM GOAL #4   Title  pt will demo sustained/selective attention necessary to complete efficacious speech therapy in 6 of the first 8 sessions    Baseline  12-31-18, 01-13-19    Period  --   or 8 sessions   Status  Partially Met       SLP Long Term Goals - 02/06/19 1547      SLP LONG TERM GOAL #1   Title  pt will demo understanding of commands (spoken) with 85% and rare min cues over three sessions    Baseline  02-06-19    Time  2    Period  Weeks   or 17 sessions, for all LTGs   Status  On-going      SLP LONG TERM GOAL #2    Title  pt will answer questions with [facility] verbal responses 75% of the time, in 3 sessions    Baseline  01-27-19    Time  2    Period  Weeks    Status  On-going      SLP LONG TERM GOAL #3   Title  pt will sustain loud /a/ with average mid 70s dB over 4 sessions    Time  5    Period  Weeks    Status  Deferred   due to focus on language     SLP LONG TERM GOAL #4   Title  pt will engage in simple conversation for 5 minutes with average loudness mid 60s dB over 3 sessions    Time  5    Period  Weeks    Status  Deferred   due to focus on language      Plan - 02/06/19 1546    Clinical Impression Statement  Pt with mod Broca's aphasia, dysarthria/voice, and cognitive communication deficits. 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 Pt would cont to benefit from skilled ST focusing on primarily receptive and expressive language but also at some point in rehab process focus on speech loudness and cognitive communication skills. Pt cont to require skilled ST focusing primarily on language skills.    Speech Therapy Frequency  2x / week    Duration  --   12 weeks or 25 total visits   Treatment/Interventions  Environmental controls;Functional tasks;Compensatory techniques;Multimodal communcation approach;SLP instruction and feedback;Cueing hierarchy;Language facilitation;Cognitive reorganization;Internal/external aids;Patient/family education    Potential to Achieve Goals  Good    Potential Considerations  Co-morbidities;Ability to learn/carryover information;Severity of impairments;Other (comment)       Patient will benefit from skilled therapeutic intervention in order to improve the following deficits and impairments:   Aphasia  Cognitive communication deficit  Dysarthria and anarthria    Problem List Patient Active Problem List   Diagnosis Date Noted  . Goals of care, counseling/discussion 12/16/2018  . Palliative care by specialist   .  Glioblastoma multiforme of temporal lobe ([place]) 11/11/2018  . Spastic hemiparesis ([place])   . Cerebral edema (HCC)   . Intracranial tumor ([place])   . Steroid-induced hyperglycemia   . Spastic hemiplegia affecting nondominant side ([place])   . Hyponatremia   . Transaminitis   . Leucocytosis   .  Right spastic hemiplegia ([place]) 10/21/2018  . Dysphagia 10/21/2018  . 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 Brain mass   . ICH (intracerebral hemorrhage) ([place]) 10/13/2018    [facility] ,[place], [place]  02/06/2019, 3:48 PM  [facility] [address] [place], [place], [address] Phone: [phone]   Fax:  [phone]   Name: MOXON MESSLER MRN: [id number] Date of Birth: Nov 13, 1995

## 2019-02-06 NOTE — Patient Instructions (Signed)
Access Code: [email or web address]  URL: [email or web address]  Date: 02/06/2019  Prepared by: Janann August   Exercises Supine Bridge - 6-8 reps - 2 sets - 2x daily - 7x weekly Hooklying Single Leg Bent Knee Fallouts with Resistance - 6-8 reps - 2x daily - 7x weekly Foot Dorsiflexion PROM Caregiver - 3 sets - 20-30 hold - 2x daily - 7x weekly Seated Heel Slide - 10 reps - 2 sets - 2x daily - 7x weekly Mini Squat with Counter Support - 10 reps - 1 sets - 2x daily - 7x weekly Supine March - 10 reps - 2 sets - 2x daily - 7x weekly Single Leg Bridge - 5 reps - 3 sets - 2x daily - 7x weekly

## 2019-02-06 NOTE — Therapy (Signed)
[facility] [address] [place], [place], [address] Phone: [phone]   Fax:  [phone]  Occupational Therapy Treatment  Patient Details  Name: Gregory Moreno MRN: [id number] Date of Birth: 1995-06-19 Referring Provider (OT): Marlowe Shores   Encounter Date: 02/06/2019  OT End of Session - 02/06/19 1658    Visit Number  15    Number of Visits  25    Date for OT Re-Evaluation  03/17/19    OT Start Time  1530    OT Stop Time  1615    OT Time Calculation (min)  45 min    Activity Tolerance  Patient tolerated treatment well    Behavior During Therapy  [facility] for tasks assessed/performed       History reviewed. No pertinent past medical history.  Past Surgical History:  Procedure Laterality Date  . CRANIOTOMY Left 10/13/2018   Procedure: LEFT CRANIOTOMY FOR TUMOR RESECTION;  Surgeon: Judith Part, MD;  Location: [facility];  Service: Neurosurgery;  Laterality: Left;    There were no vitals filed for this visit.  Subjective Assessment - 02/06/19 1539    Subjective   Amazing - regarding sleeping in his own bed    Patient is accompanied by:  Family member    Currently in Pain?  No/denies    Pain Score  0-No pain                   OT Treatments/Exercises (OP) - 02/06/19 0001      Visual/Perceptual Exercises   Other Exercises  Discussion with patient and his father regarding visual deficit.  Patient continues with loss of vision in right periphery.  Discussed imporvement versus compensation, and relationship to driving.   Will continue to monitor.        Neurological Re-education Exercises   Other Exercises 1  Neuromuscular reeducation to address transitional movements, rolling, sidelying to sitting, transfers, sit to stand and stand balance.  Patient cued to activate rright upper quadrant when rolling to left versus letting that side drag behind and weigh down trunk.  Patient able to recruit abdominal  / oblique musculature, but had difficulty sustaining activation.  Attempted rolling up onto RUE in sidesitting position, but patient had difficulty tolerating lateral trunk/ rib cage pressure.          Other Exercises 2  Worked on static stand balance with increased awareness of RLE activation and postural control in midline.  Patient able now to stand briefly without support, but needed close supervision / contact for drifting right without awareness. Patient able to stand with only intermittent support and cueing and type left handed without loss of balance x 3 min               OT Short Term Goals - 01/30/19 1630      OT SHORT TERM GOAL #2   Title  Patient will reach forward with shoulder flexion, elbow extension pattern to make contact with target in midline and 8-12' from chest with intermittent assist    Status  Achieved      OT SHORT TERM GOAL #3   Title  Patient will transfer from wheelchair to commode with no more than supervision assistance - transferring either toward right or toward left side    Status  Achieved      OT SHORT TERM GOAL #4   Title  Patient will grasp and release a cylindrical object 2-3" in diameter with min assist  Status  Achieved      OT SHORT TERM GOAL #5   Title  Patient will dress lower body with mod assist    Status  Achieved        OT Long Term Goals - 01/30/19 1630      OT LONG TERM GOAL #1   Title  Patient will dress himself with no greater than set up assistance    Status  On-going      OT LONG TERM GOAL #2   Title  Patient will shower with supervision    Status  On-going      OT LONG TERM GOAL #3   Title  Patient will cut food on plate with modified independence    Status  On-going      OT LONG TERM GOAL #4   Title  Patient will demonstrate ability to retrieve a lightweight item (less than 2lb) from waist height or lower and transport 6-12 inches laterally with RUE    Status  On-going      OT LONG TERM GOAL #5   Title   Patient will demonstrate RUE  mid level reach with minimal LUE support to hit a target at chest height directly in front of body    Status  On-going            Plan - 02/06/19 1658    Clinical Impression Statement  Patient with steady improvement in attention to right UE, stand balance, and activity tolerance    OT Frequency  2x / week    OT Duration  12 weeks    OT Treatment/Interventions  Self-care/ADL training;DME and/or AE instruction;Splinting;Balance training;Aquatic Therapy;Therapeutic activities;Therapeutic exercise;Cognitive remediation/compensation;Neuromuscular education;Functional Mobility Training;Visual/perceptual remediation/compensation;Electrical Stimulation;Manual Therapy;Patient/family education    Plan  NMR RUE/TRUNK, control of more than one joint - attempt some functional use (forced use)    Consulted and Agree with Plan of Care  Patient;Family member/caregiver    Family Member Consulted  dad-  Gregory Moreno       Patient will benefit from skilled therapeutic intervention in order to improve the following deficits and impairments:           Visit Diagnosis: 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 Goals of care, counseling/discussion 12/16/2018  . Palliative care by specialist   . Glioblastoma multiforme of temporal lobe ([facility]) 11/11/2018  . Spastic hemiparesis ([facility])   . Cerebral edema ([facility])   . Intracranial tumor ([place])   . Steroid-induced hyperglycemia   . Spastic hemiplegia affecting nondominant side ([place])   . Hyponatremia   . Transaminitis   . Leucocytosis   . Right spastic hemiplegia ([facility]) 10/21/2018  . Dysphagia 10/21/2018  . 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 Brain  mass   . ICH (intracerebral hemorrhage) ([facility]) 10/13/2018    Mariah Milling, OTR/L 02/06/2019, 5:00 PM  [facility] [address] [place], [place], [address] Phone: [phone]   Fax:  [phone]  Name: Gregory Moreno MRN: [id number] Date of Birth: May 28, 1995

## 2019-02-08 NOTE — Therapy (Signed)
[facility] [address] [place], [place], [address] Phone: [phone]   Fax:  [phone]  Physical Therapy Treatment  Patient Details  Name: Gregory Moreno MRN: [id number] Date of Birth: [birth year] Referring Provider (PT): Alger Simons, MD   Encounter Date: 02/06/2019  PT End of Session - 02/08/19 1818    Visit Number  15    Number of Visits  25    Authorization Type  [employer] - $25 co pay - collect 1 copay, VL: 30 combined (PT/OT/ST), counts as 1 as done on same day    Authorization - Visit Number  5    Authorization - Number of Visits  30    PT Start Time  9678    PT Stop Time  1532    PT Time Calculation (min)  44 min    Equipment Utilized During Treatment  Gait belt    Activity Tolerance  Patient tolerated treatment well    Behavior During Therapy  [facility] for tasks assessed/performed       No past medical history on file.  Past Surgical History:  Procedure Laterality Date  . CRANIOTOMY Left 10/13/2018   Procedure: LEFT CRANIOTOMY FOR TUMOR RESECTION;  Surgeon: Judith Part, MD;  Location: [facility];  Service: Neurosurgery;  Laterality: Left;    There were no vitals filed for this visit.                    [facility] Adult PT Treatment/Exercise - 02/08/19 2045      Bed Mobility   Rolling Left  Minimal Assistance - Patient > 75%;Other (comment)   needs cues to initiate   Supine to Sit  Minimal Assistance - Patient > 75%    Sit to Supine  Supervision/Verbal cueing      Transfers   Comments  Stand step transfer from w/c <> mat table - min A, with verbal cues for foot placement at times      Ambulation/Gait   Ambulation/Gait  Yes    Ambulation/Gait Assistance  4: Min assist    Ambulation/Gait Assistance Details  Verbal and tactile cues through pelvis/trunk to stand tall on RLE during stance phase for step through pattern with LLE and to decr R genu recurvatum. Pt responds well to visual cues  of theraband for increased step B    Ambulation Distance (Feet)  115 Feet    Assistive device  Rolling walker;Other (Comment)   R hand orthosis, R AFO   Gait Pattern  Decreased stance time - right;Decreased step length - left;Decreased weight shift to right;Decreased hip/knee flexion - right;Right genu recurvatum;Trunk flexed;Narrow base of support;Poor foot clearance - right;Step-through pattern;Decreased dorsiflexion - right    Ambulation Surface  Level;Indoor      Neuro Re-ed    Neuro Re-ed Details   Standing at countertop with min A from therapist for dynamic standing balance/weight shifting and attention to R environment-  had pt reach cross body with LUE to grab cones x10 reps and then put it to the L on countertop, needed verbal and tactile cues to activate R quads before weight shifting, needing single UE support on counter. 10 reps pt grabbing cone with LUE and then crossing body and putting cone back on his R side.       Exercises   Exercises  Other Exercises    Other Exercises   Supine on mat table: 1 x 10 reps supine marching with RLE, min A to keep RLE  in position, while performing hip ADD squeeze with pillow 2 x 5 reps bringing BLE into hip flexion for core activation, needed cues to squeeze pillow. 2 x 5 reps with LLE extended on mat table and RLE flexed, single LE bridging with RLE.              PT Education - 02/08/19 1818    Education Details  new additions to HEP    Person(s) Educated  Patient;Parent(s)    Methods  Explanation;Demonstration;Handout    Comprehension  Verbalized understanding;Returned demonstration      PT Short Term Goals - 01/14/19 1122      PT SHORT TERM GOAL #1   Title  Patient and pt's family will be independent with ongoing HEP designed to improve movement and strength in RLE.  ALL STGS DUE 02/11/19    Time  4    Period  Weeks    Status  On-going    Target Date  02/11/19      PT SHORT TERM GOAL #2   Title  Patient will stand at countertop  for at least 8 minutes with supervision to increase tolerance for ADLs.    Baseline  did not have time to formally assess on 01/13/19    Period  Weeks    Status  On-going      PT SHORT TERM GOAL #3   Title  Pt will perform stand pivot vs. squat pivot transfer to either R or L from wc to mat table with mod I  in order to decrease caregiver burden.    Baseline  needs initially cueing for technique, min guard for balance for squat pivot transfer    Time  4    Period  Weeks    Status  Revised      PT SHORT TERM GOAL #4   Title  Pt will perform all bed mobility with supervision in order to decrease caregiver burden.    Baseline  needs min A to assist with rolling and for balance for performing sidelying > seated at edge of bed.    Time  4    Period  Weeks    Status  Revised      PT SHORT TERM GOAL #5   Title  Pt will ambulate at least 23' with RW, R AFO and min guard in order to improve household mobility.    Baseline  80' with RW, R AFO and R hand orthosis with min/mod A for balance and RW navigation.    Time  4    Period  Weeks    Status  Not Met        PT Long Term Goals - 12/18/18 1827      PT LONG TERM GOAL #1   Title  Patient and pt's family will be independent with final HEP designed to improve movement and strength in RLE.  ALL LTGS DUE 03/12/19    Time  12    Period  Weeks    Status  New    Target Date  03/12/19      PT LONG TERM GOAL #2   Title  Pt will ambulate at least 300' with supervision and hemiwalker vs. LRAD in order to improve community mobility.    Time  12    Period  Weeks    Status  New      PT LONG TERM GOAL #3   Title  Pt will perform at least 10 sit <> stands from mat table  with proper technique with no cueing and mod I in order to improve functional transfers and demo improved LE strength.    Time  12    Period  Weeks    Status  New      PT LONG TERM GOAL #4   Title  Pt will perform all bed mobility from level mat table with mod I in order to  decrease caregiver burden.    Time  12    Period  Weeks    Status  New      PT LONG TERM GOAL #5   Title  Pt will perform 4 steps with step to pattern and single handrail with supervision.    Time  12    Period  Weeks    Status  New            Plan - 02/08/19 2055    Clinical Impression Statement  Focus of today's session was supine LE strengthening, gait training, and standing weight shifting and attention to R environment. Pt now able to perform R hip flexion against gravity, was able to perform supine marching today. Pt able to ambulate 115' today with min A and less episodes of genu recurvatum on R (more notably when performing turns). Still needs verbal cues to activiate R quads before weight shifting during reaching activities at countertop. Will continue to progress towards LTGs.    Personal Factors and Comorbidities  --   pt ungergoing chemo/radiation   Examination-Activity Limitations  Bed Mobility;Locomotion Level;Sit;Squat;Stairs;Stand;Transfers    Examination-Participation Restrictions  Community Activity;School;Driving    Stability/Clinical Decision Making  Evolving/Moderate complexity    Rehab Potential  Good    PT Frequency  2x / week    PT Duration  12 weeks    PT Treatment/Interventions  ADLs/Self Care Home Management;Therapeutic exercise;Therapeutic activities;Functional mobility training;Stair training;Gait training;DME Instruction;Balance training;Neuromuscular re-education;Patient/family education;Orthotic Fit/Training;Wheelchair mobility training;Energy conservation;Passive range of motion    PT Next Visit Plan  continue gait with RW (begin gait training with pt's mom and she is fearful to walk pt at home), hip flexor/quad stretching. tall kneeling when appropriate and have a 2nd set of hands,  sit <> stands from higher mat table,mini squat holds w/ weight shifting.  NMR for R weight shift (steps with LLE, step taps to 2"/4" step) Add to HEP when appropriate.    PT  Home Exercise Plan  [id number]    Consulted and Agree with Plan of Care  Patient;Family member/caregiver    Family Member Consulted  dad, Tyjae       Patient will benefit from skilled therapeutic intervention in order to improve the following deficits and impairments:  Abnormal gait, Decreased activity tolerance, Decreased balance, Decreased cognition, Decreased coordination, Decreased safety awareness, Decreased range of motion, Decreased mobility, Difficulty walking, Decreased strength, Decreased endurance, Impaired tone  Visit Diagnosis: Muscle weakness (generalized)  Unsteadiness on feet  Other symptoms and signs involving the nervous system     Problem List Patient Active Problem List   Diagnosis Date Noted  . Goals of care, counseling/discussion 12/16/2018  . Palliative care by specialist   . Glioblastoma multiforme of temporal lobe ([place]) 11/11/2018  . Spastic hemiparesis ([place])   . Cerebral edema ([facility])   . Intracranial tumor ([place])   . Steroid-induced hyperglycemia   . Spastic hemiplegia affecting nondominant side ([place])   . Hyponatremia   . Transaminitis   . Leucocytosis   . Right spastic hemiplegia ([place]) 10/21/2018  . Dysphagia 10/21/2018  . 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 Brain mass   . ICH (intracerebral hemorrhage) ([place]) 10/13/2018    Arliss Journey, PT, DPT  02/08/2019, 8:57 PM  [facility] [address] [place], [place], [address] Phone: [phone]   Fax:  [phone]  Name: ALMANDO BRAWLEY MRN: [id number] Date of Birth: 1995-06-02

## 2019-02-09 ENCOUNTER — Inpatient Hospital Stay: Payer: 59 | Attending: Internal Medicine | Admitting: Internal Medicine

## 2019-02-09 ENCOUNTER — Telehealth: Payer: Self-pay | Admitting: Radiation Oncology

## 2019-02-09 ENCOUNTER — Inpatient Hospital Stay: Payer: 59

## 2019-02-09 ENCOUNTER — Other Ambulatory Visit: Payer: Self-pay

## 2019-02-09 VITALS — BP 141/96 | HR 111 | Temp 98.5°F | Resp 18 | Ht 69.0 in | Wt 184.5 lb

## 2019-02-09 DIAGNOSIS — R112 Nausea with vomiting, unspecified: Secondary | ICD-10-CM | POA: Diagnosis not present

## 2019-02-09 DIAGNOSIS — C712 Malignant neoplasm of temporal lobe: Secondary | ICD-10-CM | POA: Diagnosis present

## 2019-02-09 DIAGNOSIS — G8111 Spastic hemiplegia affecting right dominant side: Secondary | ICD-10-CM | POA: Diagnosis not present

## 2019-02-09 DIAGNOSIS — Z79899 Other long term (current) drug therapy: Secondary | ICD-10-CM | POA: Insufficient documentation

## 2019-02-09 DIAGNOSIS — Z923 Personal history of irradiation: Secondary | ICD-10-CM | POA: Diagnosis not present

## 2019-02-09 DIAGNOSIS — Z7189 Other specified counseling: Secondary | ICD-10-CM | POA: Diagnosis not present

## 2019-02-09 LAB — CBC WITH DIFFERENTIAL (CANCER CENTER ONLY)
Abs Immature Granulocytes: 0.05 10*3/uL (ref 0.00–0.07)
Basophils Absolute: 0 10*3/uL (ref 0.0–0.1)
Basophils Relative: 0 %
Eosinophils Absolute: 0.1 10*3/uL (ref 0.0–0.5)
Eosinophils Relative: 3 %
HCT: 39.7 % (ref 39.0–52.0)
Hemoglobin: 13.1 g/dL (ref 13.0–17.0)
Immature Granulocytes: 1 %
Lymphocytes Relative: 11 %
Lymphs Abs: 0.6 10*3/uL — ABNORMAL LOW (ref 0.7–4.0)
MCH: 28.7 pg (ref 26.0–34.0)
MCHC: 33 g/dL (ref 30.0–36.0)
MCV: 86.9 fL (ref 80.0–100.0)
Monocytes Absolute: 0.5 10*3/uL (ref 0.1–1.0)
Monocytes Relative: 10 %
Neutro Abs: 3.8 10*3/uL (ref 1.7–7.7)
Neutrophils Relative %: 75 %
Platelet Count: 227 10*3/uL (ref 150–400)
RBC: 4.57 MIL/uL (ref 4.22–5.81)
RDW: 17.4 % — ABNORMAL HIGH (ref 11.5–15.5)
WBC Count: 5.1 10*3/uL (ref 4.0–10.5)
nRBC: 0 % (ref 0.0–0.2)

## 2019-02-09 LAB — CMP (CANCER CENTER ONLY)
ALT: 43 U/L (ref 0–44)
AST: 20 U/L (ref 15–41)
Albumin: 3.8 g/dL (ref 3.5–5.0)
Alkaline Phosphatase: 64 U/L (ref 38–126)
Anion gap: 10 (ref 5–15)
BUN: 7 mg/dL (ref 6–20)
CO2: 26 mmol/L (ref 22–32)
Calcium: 9.6 mg/dL (ref 8.9–10.3)
Chloride: 106 mmol/L (ref 98–111)
Creatinine: 0.87 mg/dL (ref 0.61–1.24)
GFR, Est AFR Am: >60 mL/min (ref >60)
GFR, Estimated: >60 mL/min (ref >60)
Glucose, Bld: 109 mg/dL — ABNORMAL HIGH (ref 70–99)
Potassium: 3.5 mmol/L (ref 3.5–5.1)
Sodium: 142 mmol/L (ref 135–145)
Total Bilirubin: 0.4 mg/dL (ref 0.3–1.2)
Total Protein: 7.8 g/dL (ref 6.5–8.1)

## 2019-02-09 MED ORDER — TEMOZOLOMIDE 100 MG PO CAPS: 150.0000 mg/m2/d | ORAL_CAPSULE | Freq: Every day | ORAL | 0 refills | Status: DC

## 2019-02-09 MED ORDER — ONDANSETRON HCL 8 MG PO TABS: 8.0000 mg | ORAL_TABLET | Freq: Two times a day (BID) | ORAL | 1 refills | Status: DC | PRN

## 2019-02-09 NOTE — Progress Notes (Signed)
DISCONTINUE ON PATHWAY REGIMEN - Neuro     One cycle, daily for 42 days concurrent with RT:     Temozolomide   **Always confirm dose/schedule in your pharmacy ordering system**  REASON: Continuation Of Treatment PRIOR TREATMENT: [id number]: Radiation Therapy with Concurrent Temozolomide 75 mg/m2 Daily x 6 Weeks, Followed by Sequential Temozolomide TREATMENT RESPONSE: Stable Disease (SD)  START ON PATHWAY REGIMEN - Neuro     A cycle is every 28 days:     Temozolomide      Temozolomide   **Always confirm dose/schedule in your pharmacy ordering system**  Patient Characteristics: Glioblastoma (Grade IV Glioma), Newly Diagnosed / Treatment Naive, Good Performance Status and/or Younger Patient, MGMT Promoter Unmethylated/Unknown Disease Classification: Glioma Disease Classification: Glioblastoma (Grade IV Glioma) Disease Status: Newly Diagnosed / Treatment Naive Performance Status: Good Performance Status and/or Younger Patient MGMT Promoter Methylation Status: Awaiting Test Results Intent of Therapy: Non-Curative / Palliative Intent, Discussed with Patient

## 2019-02-09 NOTE — Telephone Encounter (Signed)
  Radiation Oncology         [phone] ________________________________  Name: Gregory Moreno MRN: [id number]  Date of Service: 02/11/19 DOB: [birth year]  Post Treatment Telephone Note  Diagnosis:   Glioblastoma of the Temporal Lobe  Interval Since Last Radiation: 4 weeks   12/01/2018-01/13/2019: The temporal lobe target was treated to 46 Gy in 23 fractions, and a 14 Gy boost in 7 fractions  Narrative:  The patient was contacted today for routine follow-up. During treatment he did very well with radiotherapy and did not have significant desquamation. His mother answered and reported he is doing well. His recent MRI was reviewed in conference with plans to continue systemic therapy under Dr. [name].  Impression/Plan: 1. Glioblastoma of the Temporal Lobe. The patient has been doing well since completion of radiotherapy and continues to meet small milestones with PT/OT. We discussed that we would be happy to continue to follow as needed, but he will continue to follow up with Dr. [name] in neuro oncology.     Carola Rhine, PAC

## 2019-02-09 NOTE — Progress Notes (Signed)
[facility] [place], [place] [address] [phone]   Interval Evaluation  Date of Service: 02/09/19 Patient Name: Gregory Moreno Patient MRN: [id number] Patient DOB: 23-Aug-1995 Provider: Ventura Sellers, MD  Identifying Statement:  [name] is a 24 y.o. male with left temporal glioblastoma   Referring Provider: [place] [address] [place],  [place] [address]  Oncologic History: Oncology History  Glioblastoma multiforme of temporal lobe ([facility])  11/11/2018 Initial Diagnosis   Glioblastoma multiforme of temporal lobe ([facility])   11/25/2018 -  Chemotherapy   The patient had [No matching medication found in this treatment plan]  for chemotherapy treatment.      Biomarkers:  MGMT Unknown.  IDH 1/2 Wild type.  EGFR Unknown  TERT Unknown   Interval History:  TAUREAN JU presents today for follow up, now having nearly completed radiation therapy and concurrent Temodar.  Right side strength is stable or very slightly improved.  He continues to ambulate around the home with walker assist.  Remains with dense left visual field impairment.  Language has been stable over recent weeks, he still has good understanding but struggles to put sentences together.  Currently off decadron and ritalin.  Medications: Current Outpatient Medications on File Prior to Visit  Medication Sig Dispense Refill  . acetaminophen (TYLENOL) 325 MG tablet Take 2 tablets (650 mg total) by mouth every 4 (four) hours as needed for mild pain (temp > 100.5).    . baclofen (LIORESAL) 10 MG tablet Take 1 tablet (10 mg total) by mouth at bedtime as needed for muscle spasms. 30 each 0  . escitalopram (LEXAPRO) 5 MG tablet Take 1 tablet (5 mg total) by mouth at bedtime. 30 tablet 0  . methylphenidate (RITALIN) 5 MG tablet Take 2m by mouth twice per day for 7 days, then 55mdaily for 7 days, then STOP 21 tablet 0   No  current facility-administered medications on file prior to visit.    Allergies: No Known Allergies Past Medical History: No past medical history on file. Past Surgical History:  Past Surgical History:  Procedure Laterality Date  . CRANIOTOMY Left 10/13/2018   Procedure: LEFT CRANIOTOMY FOR TUMOR RESECTION;  Surgeon: OsJudith PartMD;  [facility] Service: Neurosurgery;  Laterality: Left;   Social History:  Social History   Socioeconomic History  . Marital status: Single    Spouse name: Not on file  . Number of children: Not on file  . Years of education: Not on file  . Highest education level: Not on file  Occupational History  . Not on file  Tobacco Use  . Smoking status: Never Smoker  . Smokeless tobacco: Never Used  Substance and Sexual Activity  . Alcohol use: Not on file  . Drug use: Not on file  . Sexual activity: Not on file  Other Topics Concern  . Not on file  Social History Narrative  . Not on file   Social Determinants of Health   Financial Resource Strain:   . Difficulty of Paying Living Expenses: Not on file  Food Insecurity:   . Worried About RuCharity fundraisern the Last Year: Not on file  . Ran Out of Food in the Last Year: Not on file  Transportation Needs:   . Lack of Transportation (Medical): Not on file  . Lack of Transportation (Non-Medical): Not on file  Physical Activity:   .  Days of Exercise per Week: Not on file  . Minutes of Exercise per Session: Not on file  Stress:   . Feeling of Stress : Not on file  Social Connections:   . Frequency of Communication with Friends and Family: Not on file  . Frequency of Social Gatherings with Friends and Family: Not on file  . Attends Religious Services: Not on file  . Active Member of Clubs or Organizations: Not on file  . Attends Archivist Meetings: Not on file  . Marital Status: Not on file  Intimate Partner Violence:   . Fear of Current or Ex-Partner: Not on file  .  Emotionally Abused: Not on file  . Physically Abused: Not on file  . Sexually Abused: Not on file   Family History: No family history on file.  Review of Systems: Constitutional: Denies fevers, chills or abnormal weight loss Eyes: Denies blurriness of vision Ears, nose, mouth, throat, and face: Denies mucositis or sore throat Respiratory: Denies cough, dyspnea or wheezes Cardiovascular: Denies palpitation, chest discomfort or lower extremity swelling Gastrointestinal:  Denies nausea, constipation, diarrhea GU: Denies dysuria or incontinence Skin: Denies abnormal skin rashes Neurological: Per HPI Musculoskeletal: Denies joint pain, back or neck discomfort. No decrease in ROM Behavioral/Psych: Denies anxiety, disturbance in thought content, and mood instability  Physical Exam: Vitals:   02/09/19 1100  BP: (!) 141/96  Pulse: (!) 111  Resp: 18  Temp: 98.5 F (36.9 C)  SpO2: 98%   KPS: 60. General: Alert, cooperative, pleasant, in no acute distress Head: Craniotomy scar noted, dry and intact. EENT: No conjunctival injection or scleral icterus. Oral mucosa moist Lungs: Resp effort normal Cardiac: Regular rate and rhythm Abdomen: Soft, non-distended abdomen Skin: No rashes cyanosis or petechiae. Extremities: No clubbing or edema  Neurologic Exam: Mental Status: Awake, alert, attentive to examiner. Oriented to self and environment. Language is densely impaired with regards to fluency.  Comprehension impaired to multi-step complex commands.  Simple repetition is preserved. Cranial Nerves: Visual acuity is grossly normal. Dense R homonymous hemianopia. Extra-ocular movements intact. No ptosis. Face is symmetric, tongue midline. Motor: Tone and bulk are normal. Power is 3/5 in right arm and leg, 5/5 on left side. Reflexes are symmetric, no pathologic reflexes present. Sensory: Intact to light touch and temperature Gait: Deferred  Labs: I have reviewed the data as listed      Component Value Date/Time   NA 141 01/13/2019 1040   K 3.7 01/13/2019 1040   CL 105 01/13/2019 1040   CO2 25 01/13/2019 1040   GLUCOSE 115 (H) 01/13/2019 1040   BUN 13 01/13/2019 1040   CREATININE 0.76 01/13/2019 1040   CALCIUM 9.1 01/13/2019 1040   PROT 7.1 01/13/2019 1040   ALBUMIN 3.4 (L) 01/13/2019 1040   AST 14 (L) 01/13/2019 1040   ALT 60 (H) 01/13/2019 1040   ALKPHOS 64 01/13/2019 1040   BILITOT 0.3 01/13/2019 1040   GFRNONAA >60 01/13/2019 1040   GFRAA >60 01/13/2019 1040   Lab Results  Component Value Date   WBC 5.1 02/09/2019   NEUTROABS 3.8 02/09/2019   HGB 13.1 02/09/2019   HCT 39.7 02/09/2019   MCV 86.9 02/09/2019   PLT 227 02/09/2019     Assessment/Plan Glioblastoma multiforme of temporal lobe ([facility]) [C71.2]   [name] is clinically stable now having completed IMRT and concurrent Temodar.  MRI demonstrates overall improvement, with some embedded enhancement likely consistent with blood-brain barrier disruption secondary to radiation.   We continue  to be very pleased with his functional improvement through aggressive neuro-directed PT and OT.  We recommended initiating treatment with Temozolomide 150 mg/m2, on for five days and off for twenty three days in twenty eight day cycles. The patient will have a complete blood count performed on days 21 and 28 of each cycle, and a comprehensive metabolic panel performed on day 28 of each cycle. Labs may need to be performed more often. Zofran will prescribed for home use for nausea/vomiting.   Chemotherapy should be held for the following:  ANC less than 1,000  Platelets less than 100,000  LFT or creatinine greater than 2x ULN  If clinical concerns/contraindications develop  He will follow up again in 5 weeks with labs for evaluation.  All questions were answered. The patient knows to call the clinic with any problems, questions or concerns. No barriers to learning were detected.  The total time spent  in the encounter was 40 minutes and more than 50% was on counseling and review of test results   Ventura Sellers, MD Medical Director of Neuro-Oncology [facility] 02/09/19 11:12 AM

## 2019-02-10 ENCOUNTER — Ambulatory Visit: Payer: 59 | Admitting: Physical Therapy

## 2019-02-10 ENCOUNTER — Encounter: Payer: Self-pay | Admitting: Pharmacist

## 2019-02-10 ENCOUNTER — Telehealth: Payer: Self-pay | Admitting: Internal Medicine

## 2019-02-10 ENCOUNTER — Encounter: Payer: Self-pay | Admitting: Occupational Therapy

## 2019-02-10 ENCOUNTER — Ambulatory Visit: Payer: 59 | Admitting: Occupational Therapy

## 2019-02-10 ENCOUNTER — Ambulatory Visit: Payer: 59

## 2019-02-10 DIAGNOSIS — G8191 Hemiplegia, unspecified affecting right dominant side: Secondary | ICD-10-CM

## 2019-02-10 DIAGNOSIS — R2681 Unsteadiness on feet: Secondary | ICD-10-CM

## 2019-02-10 DIAGNOSIS — M6281 Muscle weakness (generalized): Secondary | ICD-10-CM

## 2019-02-10 DIAGNOSIS — R471 Dysarthria and anarthria: Secondary | ICD-10-CM

## 2019-02-10 DIAGNOSIS — R29818 Other symptoms and signs involving the nervous system: Secondary | ICD-10-CM

## 2019-02-10 DIAGNOSIS — R4701 Aphasia: Secondary | ICD-10-CM

## 2019-02-10 DIAGNOSIS — R41842 Visuospatial deficit: Secondary | ICD-10-CM

## 2019-02-10 DIAGNOSIS — R482 Apraxia: Secondary | ICD-10-CM

## 2019-02-10 DIAGNOSIS — R41841 Cognitive communication deficit: Secondary | ICD-10-CM

## 2019-02-10 NOTE — Therapy (Signed)
[facility] [address] [place], [place], [address] Phone: [phone]   Fax:  [phone]  Occupational Therapy Treatment  Patient Details  Name: Gregory Moreno MRN: [id number] Date of Birth: 02-Jul-1995 Referring Provider (OT): Mar[place]e [place]   Encounter Date: 02/10/2019  OT End of Session - 02/10/19 1757    Visit Number  16    Number of Visits  25    Date for OT Re-Evaluation  03/17/19    OT Start Time  1700    OT Stop Time  1742    OT Time Calculation (min)  42 min    Activity Tolerance  Patient tolerated treatment well    Behavior During Therapy  [facility] for tasks assessed/performed       History reviewed. No pertinent past medical history.  Past Surgical History:  Procedure Laterality Date  . CRANIOTOMY Left 10/13/2018   Procedure: LEFT CRANIOTOMY FOR TUMOR RESECTION;  Surgeon: Judith Part, MD;  Location: [facility];  Service: Neurosurgery;  Laterality: Left;    There were no vitals filed for this visit.  Subjective Assessment - 02/10/19 1751    Subjective   Stupid- regarding getting onto floor    Patient is accompanied by:  Family member    Currently in Pain?  No/denies    Pain Score  0-No pain                   OT Treatments/Exercises (OP) - 02/10/19 0001      ADLs   ADL Comments  Mom shared results from recent MRI.  Apparently there is tumor remaining from initial surgery..  Patient will start another round of oral chemotherapy.        Neurological Re-education Exercises   Other Exercises 1  Neuromuscular reeducation to address transitional movements.  Worked with PT on floor transfers.  Patient is apraxic, and has little ability to motor plan, and he has limited hip and knee range of motion, so these larger transitions are fear provoking for him as well as painful as they stretch hips, knees and ankles.  Worked with patient in [place] position on right side prior t transfer from floor.   Patient able to tolerate this position today and use RUE to support body weight without discomfort.  Patient assisted to roll up into kneeling than half kneeling and then standing to get off floor.  Patient with improved postural control and trunk activation during transitional movements.      Other Exercises 2  In standing worked in modified plantigrade position to put weight through right extremities, and help to force activation of RUE - wanting to train RUE to assist as support.  Patient with improved RLE activation in standing and during ant/post, and lateral weight shifts today.  No wrist pain with long arm weight bearing.                 OT Short Term Goals - 01/30/19 1630      OT SHORT TERM GOAL #2   Title  Patient will reach forward with shoulder flexion, elbow extension pattern to make contact with target in midline and 8-12' from chest with intermittent assist    Status  Achieved      OT SHORT TERM GOAL #3   Title  Patient will transfer from wheelchair to commode with no more than supervision assistance - transferring either toward right or toward left side    Status  Achieved  OT SHORT TERM GOAL #4   Title  Patient will grasp and release a cylindrical object 2-3" in diameter with min assist    Status  Achieved      OT SHORT TERM GOAL #5   Title  Patient will dress lower body with mod assist    Status  Achieved        OT Long Term Goals - 01/30/19 1630      OT LONG TERM GOAL #1   Title  Patient will dress himself with no greater than set up assistance    Status  On-going      OT LONG TERM GOAL #2   Title  Patient will shower with supervision    Status  On-going      OT LONG TERM GOAL #3   Title  Patient will cut food on plate with modified independence    Status  On-going      OT LONG TERM GOAL #4   Title  Patient will demonstrate ability to retrieve a lightweight item (less than 2lb) from waist height or lower and transport 6-12 inches laterally with  RUE    Status  On-going      OT LONG TERM GOAL #5   Title  Patient will demonstrate RUE  mid level reach with minimal LUE support to hit a target at chest height directly in front of body    Status  On-going            Plan - 02/10/19 1758    Clinical Impression Statement  Patient showing marked improvement in postural control, and activity tolerance.  Patient will start anothr round of chemo next week.    OT Frequency  2x / week    OT Duration  12 weeks    OT Treatment/Interventions  Self-care/ADL training;DME and/or AE instruction;Splinting;Balance training;Aquatic Therapy;Therapeutic activities;Therapeutic exercise;Cognitive remediation/compensation;Neuromuscular education;Functional Mobility Training;Visual/perceptual remediation/compensation;Electrical Stimulation;Manual Therapy;Patient/family education    Plan  NMR RUE/TRUNK, control of more than one joint - attempt some functional use (forced use)    Consulted and Agree with Plan of Care  Patient;Family member/caregiver    Family Member Consulted  mom       Patient will benefit from skilled therapeutic intervention in order to improve the following deficits and impairments:           Visit Diagnosis: 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 Goals of care, counseling/discussion 12/16/2018  . Palliative care by specialist   . Glioblastoma multiforme of temporal lobe ([facility]) 11/11/2018  . Spastic hemiparesis ([facility])   . Cerebral edema ([facility])   . Intracranial tumor ([facility])   . Steroid-induced hyperglycemia   . Spastic hemiplegia affecting nondominant side ([facility])   . Hyponatremia   . Transaminitis   . Leucocytosis   . Right spastic hemiplegia  ([facility]) 10/21/2018  . Dysphagia 10/21/2018  . 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 Brain mass   . ICH (intracerebral hemorrhage) ([facility]) 10/13/2018    Mariah Milling, OTR/L 02/10/2019, 6:00 PM  [facility] [address] [place], [place], [address] Phone: [phone]   Fax:  [phone]  Name: Gregory Moreno MRN: [id number] Date of Birth: 09/15/1995

## 2019-02-10 NOTE — Telephone Encounter (Signed)
Scheduled appt per 1/25 los.  Left a vm of the appt date and time,

## 2019-02-10 NOTE — Telephone Encounter (Signed)
Erroneous encounter

## 2019-02-10 NOTE — Therapy (Signed)
[facility] [address] [place], [place], [address] Phone: [phone]   Fax:  [phone]  Speech Language Pathology Treatment  Patient Details  Name: Gregory Moreno MRN: [id number] Date of Birth: 07/01/1995 Referring Provider (SLP): Alger Simons, MD   Encounter Date: 02/10/2019  End of Session - 02/10/19 1658    Visit Number  15    Number of Visits  25    Date for SLP Re-Evaluation  03/17/19    SLP Start Time  0254    SLP Stop Time   1616    SLP Time Calculation (min)  43 min       No past medical history on file.  Past Surgical History:  Procedure Laterality Date  . CRANIOTOMY Left 10/13/2018   Procedure: LEFT CRANIOTOMY FOR TUMOR RESECTION;  Surgeon: Judith Part, MD;  Location: [facility];  Service: Neurosurgery;  Laterality: Left;    There were no vitals filed for this visit.  Subjective Assessment - 02/10/19 1544    Subjective  "Not much."    Patient is accompained by:  Family member   mother   Currently in Pain?  No/denies            ADULT SLP TREATMENT - 02/10/19 1548      General Information   Behavior/Cognition  Alert;Cooperative;Lethargic;Requires cueing;Decreased sustained attention      Treatment Provided   Treatment provided  Cognitive-Linquistic      Cognitive-Linquistic Treatment   Treatment focused on  Aphasia    Skilled Treatment  Mother shared that pt with difficulty generating "fan" some/most nights at bedtime, requesting mom to turn on the fan. SLP used photo cards of common very descriptive food to assist pt in increasing naming ability in specific circumstances. SLP provided pt a list of descriptive terms (shape, composition, taste, color, etc) and reviewed this with pt prior to task. SLP provided pt max cues for this task. Pt assisted sometimes by SLP spelling a target word, slowly, but not all the time. SLP wrote out each descriptive term pt generated with SLP A. When  reviewing the words, pt made aphasic errors or perseverated at least 50% of the time. Not as much gibberish as previous session, but pt still very difficult to hear, decreasing intelligibility to ~60%.      Assessment / Recommendations / Plan   Plan  Continue with current plan of care      Progression Toward Goals   Progression toward goals  Progressing toward goals         SLP Short Term Goals - 01/30/19 1508      SLP SHORT TERM GOAL #1   Title  pt will complete aphasia assessment of auditory comprehension and verbal expression within 1-2 sessions    Status  Achieved      SLP SHORT TERM GOAL #2   Title  pt will demo understanding of more complex commands (spoken) with 80% and occasional min cues over three sessions    Status  Not Met      SLP SHORT TERM GOAL #3   Title  pt will answer pertinent questions with at least 3 words    Status  Not Met      SLP SHORT TERM GOAL #4   Title  pt will demo sustained/selective attention necessary to complete efficacious speech therapy in 6 of the first 8 sessions    Baseline  12-31-18, 01-13-19    Period  --   or 8  sessions   Status  Partially Met       SLP Long Term Goals - 02/10/19 1710      SLP LONG TERM GOAL #1   Title  pt will demo understanding of commands (spoken) with 85% and rare min cues over three sessions    Baseline  02-06-19    Time  1    Period  Weeks   or 17 sessions, for all LTGs   Status  On-going      SLP LONG TERM GOAL #2   Title  pt will answer questions with [facility] verbal responses 75% of the time, in 3 sessions    Baseline  01-27-19    Time  1    Period  Weeks    Status  On-going      SLP LONG TERM GOAL #3   Title  pt will sustain loud /a/ with average mid 70s dB over 4 sessions    Time  5    Period  Weeks    Status  Deferred   due to focus on language     SLP LONG TERM GOAL #4   Title  pt will engage in simple conversation for 5 minutes with average loudness mid 60s dB over 3 sessions    Time  5     Period  Weeks    Status  Deferred   due to focus on language      Plan - 02/10/19 1658    Clinical Impression Statement  Pt with mod Broca's aphasia, dysarthria/voice, and cognitive communication deficits. Pt with demonstrated better sustained attention today than previous session. Pt would cont to benefit from skilled ST focusing on primarily receptive and expressive language but also at some point in rehab process focus on speech loudness and cognitive communication skills. Pt cont to require skilled ST focusing primarily on language skills.    Speech Therapy Frequency  2x / week    Duration  --   12 weeks or 25 total visits   Treatment/Interventions  Environmental controls;Functional tasks;Compensatory techniques;Multimodal communcation approach;SLP instruction and feedback;Cueing hierarchy;Language facilitation;Cognitive reorganization;Internal/external aids;Patient/family education    Potential to Achieve Goals  Good    Potential Considerations  Co-morbidities;Ability to learn/carryover information;Severity of impairments;Other (comment)       Patient will benefit from skilled therapeutic intervention in order to improve the following deficits and impairments:   Aphasia  Cognitive communication deficit  Dysarthria and anarthria    Problem List Patient Active Problem List   Diagnosis Date Noted  . Goals of care, counseling/discussion 12/16/2018  . Palliative care by specialist   . Glioblastoma multiforme of temporal lobe ([place]) 11/11/2018  . Spastic hemiparesis ([place])   . Cerebral edema ([facility])   . Intracranial tumor ([place])   . Steroid-induced hyperglycemia   . Spastic hemiplegia affecting nondominant side ([place])   . Hyponatremia   . Transaminitis   . Leucocytosis   . Right spastic hemiplegia ([place]) 10/21/2018  . Dysphagia 10/21/2018  . 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 Brain mass   . ICH (intracerebral hemorrhage) ([place]) 10/13/2018    [facility] ,[place],  [place]  02/10/2019, 5:11 PM  [facility] [address] [place], [place], [address] Phone: [phone]   Fax:  [phone]   Name: EDSEL SHIVES MRN: [id number] Date of Birth: [birth year]

## 2019-02-11 NOTE — Therapy (Signed)
[facility] [address] [place], [place], [address] Phone: [phone]   Fax:  [phone]  Physical Therapy Treatment  Patient Details  Name: KENDELL SAGRAVES MRN: [id number] Date of Birth: [birth year] Referring Provider (PT): Alger Simons, MD   Encounter Date: 02/10/2019  PT End of Session - 02/11/19 1427    Visit Number  16    Number of Visits  25    Authorization Type  [employer] - $25 co pay - collect 1 copay, VL: 30 combined (PT/OT/ST), counts as 1 as done on same day    Authorization - Visit Number  6    Authorization - Number of Visits  30    PT Start Time  6568    PT Stop Time  1700    PT Time Calculation (min)  41 min    Equipment Utilized During Treatment  Gait belt    Activity Tolerance  Patient tolerated treatment well;Patient limited by pain    Behavior During Therapy  [facility] for tasks assessed/performed;Anxious       No past medical history on file.  Past Surgical History:  Procedure Laterality Date  . CRANIOTOMY Left 10/13/2018   Procedure: LEFT CRANIOTOMY FOR TUMOR RESECTION;  Surgeon: Judith Part, MD;  Location: [facility];  Service: Neurosurgery;  Laterality: Left;    There were no vitals filed for this visit.  Subjective Assessment - 02/10/19 1623    Subjective  per pt's mom: Got his MRI done on Friday. Still a little bit of the tumor left, but it has gotten smaller. will start another round of oral chemotherapy.    Limitations  Sitting;Walking;Standing    How long can you walk comfortably?  100' with hemiwalker    Patient Stated Goals  wants to improve his walking, get back to normal    Currently in Pain?  No/denies                       [facility] Adult PT Treatment/Exercise - 02/11/19 1429      Transfers   Transfers  Sit to Stand;Stand to Sit    Sit to Stand  4: Min guard    Sit to Stand Details (indicate cue type and reason)  throughout session from w/c, pt able to properly  place RLE before standing      Squat Pivot Transfers  5: Supervision    Squat Pivot Transfer Details (indicate cue type and reason)  from w/c <> mat table      Ambulation/Gait   Ambulation/Gait  Yes    Ambulation/Gait Assistance  3: Mod assist    Ambulation/Gait Assistance Details  With OT providing hand hold assist with LUE and assist at trunk and  PT providing min/mod A at pelvis for balance, occasional placement of RLE and cueing of quad activation on RLE - ambulated 115' with no AD. Initial cues for quad activation and to stand tall before stepping LLE, with increased practice and repetition pt with better ability to motor plan and with more automatic gait. Verbal cueing for step width with RLE, as at times pt initially with increased ADD. As gait progressed, able to decr assistance with L hand hold assist. Had 2 bouts of 3 steps with very close min guard and no UE assist, with therapist providing min A at hips for balance. Seated rest break halfway through due to fatigue.     Ambulation Distance (Feet)  115 Feet    Assistive device  None;Other (Comment)   R hand orthosis, R AFO   Gait Pattern  Decreased stance time - right;Decreased step length - left;Decreased weight shift to right;Decreased hip/knee flexion - right;Right genu recurvatum;Trunk flexed;Narrow base of support;Poor foot clearance - right;Step-through pattern;Decreased dorsiflexion - right    Ambulation Surface  Level;Indoor      Therapeutic Activites    Therapeutic Activities  Other Therapeutic Activities    Other Therapeutic Activities  With OT - worked on floor transfers for fall recovery strategies. Pt requiring mod A of 2 to safely get down to the floor with verbal and demonstrative/tactile cues how to do so safely. Pt is anxious and tearful initially when on his knees on mat due to limited hip and knee AROM. Slowly sat pt down on mat on right side and provided emotional support while pt weight bearing through RLE. While on  floor, assisted pt to long sitting position for additional stretch - with PT providing min A posteriorly at trunk and OT assisting at distal ends of pt's B lower extremities, assisted pt with scooting and weight shifting in long sitting position. From long sitting and assistance from PT slowly assisted pt with focus on eccentric control down to wedge - x2 reps of pt using core musculature and trunk with min/mod A to assist back to long sitting before laying down on wedge. After stretching on wedge, pt assisted by PT/OT to roll towards pt's L to get up into tall kneeling and then half kneeling using BUE support on mat table, and then quickly to standing to get up off the floor (with pt unable to tolerate tall kneeling for a longer period of time).       Exercises   Exercises  Other Exercises    Other Exercises   With pt semi-reclined on floor on wedge: PT performing PROM of RLE with stretching to ankle DFs, hip extensors, hip IR/ER (with pt more limited in hip IR), hip ADD - pt with no increased pain or discomfort.                PT Short Term Goals - 01/14/19 1122      PT SHORT TERM GOAL #1   Title  Patient and pt's family will be independent with ongoing HEP designed to improve movement and strength in RLE.  ALL STGS DUE 02/11/19    Time  4    Period  Weeks    Status  On-going    Target Date  02/11/19      PT SHORT TERM GOAL #2   Title  Patient will stand at countertop for at least 8 minutes with supervision to increase tolerance for ADLs.    Baseline  did not have time to formally assess on 01/13/19    Period  Weeks    Status  On-going      PT SHORT TERM GOAL #3   Title  Pt will perform stand pivot vs. squat pivot transfer to either R or L from wc to mat table with mod I  in order to decrease caregiver burden.    Baseline  needs initially cueing for technique, min guard for balance for squat pivot transfer    Time  4    Period  Weeks    Status  Revised      PT SHORT TERM GOAL  #4   Title  Pt will perform all bed mobility with supervision in order to decrease caregiver burden.    Baseline  needs min  A to assist with rolling and for balance for performing sidelying > seated at edge of bed.    Time  4    Period  Weeks    Status  Revised      PT SHORT TERM GOAL #5   Title  Pt will ambulate at least 83' with RW, R AFO and min guard in order to improve household mobility.    Baseline  89' with RW, R AFO and R hand orthosis with min/mod A for balance and RW navigation.    Time  4    Period  Weeks    Status  Not Met        PT Long Term Goals - 12/18/18 1827      PT LONG TERM GOAL #1   Title  Patient and pt's family will be independent with final HEP designed to improve movement and strength in RLE.  ALL LTGS DUE 03/12/19    Time  12    Period  Weeks    Status  New    Target Date  03/12/19      PT LONG TERM GOAL #2   Title  Pt will ambulate at least 300' with supervision and hemiwalker vs. LRAD in order to improve community mobility.    Time  12    Period  Weeks    Status  New      PT LONG TERM GOAL #3   Title  Pt will perform at least 10 sit <> stands from mat table with proper technique with no cueing and mod I in order to improve functional transfers and demo improved LE strength.    Time  12    Period  Weeks    Status  New      PT LONG TERM GOAL #4   Title  Pt will perform all bed mobility from level mat table with mod I in order to decrease caregiver burden.    Time  12    Period  Weeks    Status  New      PT LONG TERM GOAL #5   Title  Pt will perform 4 steps with step to pattern and single handrail with supervision.    Time  12    Period  Weeks    Status  New            Plan - 02/11/19 1556    Clinical Impression Statement  Session performed with OT: attempted floor transfers today for fall recovery strategies, pt tearful and anxious in tall kneeling position on floor due to decr hip/knee ROM, unable to tolerate position for long.  Performed supine RLE PROM and stretching before coming back to stand with mod A of 2 and visual/demo cues due to apraxia. Ambulated 31' with PT/OT with no AD, progressing to less hand held support from OT, pt demonstrating improved ability to motor plan by activating RLE and stand tall before stepping LLE through, as well as improved step width. Will continue to progress towards LTGs.    Personal Factors and Comorbidities  --   pt ungergoing chemo/radiation   Examination-Activity Limitations  Bed Mobility;Locomotion Level;Sit;Squat;Stairs;Stand;Transfers    Examination-Participation Restrictions  Community Activity;School;Driving    Stability/Clinical Decision Making  Evolving/Moderate complexity    Rehab Potential  Good    PT Frequency  2x / week    PT Duration  12 weeks    PT Treatment/Interventions  ADLs/Self Care Home Management;Therapeutic exercise;Therapeutic activities;Functional mobility training;Stair training;Gait training;DME Instruction;Balance training;Neuromuscular re-education;Patient/family  education;Orthotic Fit/Training;Wheelchair mobility training;Energy conservation;Passive range of motion    PT Next Visit Plan  check STGs. continue gait with RW (begin gait training with pt's mom and she is fearful to walk pt at home), hip flexor/quad stretching. tall kneeling when appropriate and have a 2nd set of hands,  sit <> stands from higher mat table,mini squat holds w/ weight shifting.  NMR for R weight shift (steps with LLE, step taps to 2"/4" step) Add to HEP when appropriate.    PT Home Exercise Plan  [id number]    Consulted and Agree with Plan of Care  Patient;Family member/caregiver    Family Member Consulted  dad, Kimani       Patient will benefit from skilled therapeutic intervention in order to improve the following deficits and impairments:  Abnormal gait, Decreased activity tolerance, Decreased balance, Decreased cognition, Decreased coordination, Decreased safety awareness,  Decreased range of motion, Decreased mobility, Difficulty walking, Decreased strength, Decreased endurance, Impaired tone  Visit Diagnosis: Muscle weakness (generalized)  Unsteadiness on feet  Other symptoms and signs involving the nervous system  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 Goals of care, counseling/discussion 12/16/2018  . Palliative care by specialist   . 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 Spastic hemiparesis ([place])   . Cerebral edema (HCC)   . Intracranial tumor ([place])   . Steroid-induced hyperglycemia   . Spastic hemiplegia affecting nondominant side ([place])   . Hyponatremia   . Transaminitis   . Leucocytosis   . Right spastic hemiplegia ([place]) 10/21/2018  . Dysphagia 10/21/2018  . 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 Brain mass   . ICH (intracerebral hemorrhage) ([place]) 10/13/2018    Arliss Journey, PT, DPT  02/11/2019, 4:04 PM  [facility] [address] [place], [place], [address] Phone: [phone]   Fax:  [phone]  Name: GRAVIEL PAYEUR MRN: [id number] Date of Birth: 10/19/1995

## 2019-02-13 ENCOUNTER — Other Ambulatory Visit: Payer: Self-pay

## 2019-02-13 ENCOUNTER — Other Ambulatory Visit: Payer: Self-pay | Admitting: Internal Medicine

## 2019-02-13 ENCOUNTER — Encounter: Payer: Self-pay | Admitting: Occupational Therapy

## 2019-02-13 ENCOUNTER — Ambulatory Visit: Payer: 59 | Admitting: Occupational Therapy

## 2019-02-13 ENCOUNTER — Ambulatory Visit: Payer: 59

## 2019-02-13 ENCOUNTER — Ambulatory Visit: Payer: 59 | Admitting: Physical Therapy

## 2019-02-13 DIAGNOSIS — M6281 Muscle weakness (generalized): Secondary | ICD-10-CM | POA: Diagnosis not present

## 2019-02-13 DIAGNOSIS — R41841 Cognitive communication deficit: Secondary | ICD-10-CM

## 2019-02-13 DIAGNOSIS — R2681 Unsteadiness on feet: Secondary | ICD-10-CM

## 2019-02-13 DIAGNOSIS — R482 Apraxia: Secondary | ICD-10-CM

## 2019-02-13 DIAGNOSIS — R4701 Aphasia: Secondary | ICD-10-CM

## 2019-02-13 DIAGNOSIS — C712 Malignant neoplasm of temporal lobe: Secondary | ICD-10-CM

## 2019-02-13 DIAGNOSIS — R29818 Other symptoms and signs involving the nervous system: Secondary | ICD-10-CM

## 2019-02-13 DIAGNOSIS — G8191 Hemiplegia, unspecified affecting right dominant side: Secondary | ICD-10-CM

## 2019-02-13 DIAGNOSIS — R41842 Visuospatial deficit: Secondary | ICD-10-CM

## 2019-02-13 DIAGNOSIS — R471 Dysarthria and anarthria: Secondary | ICD-10-CM

## 2019-02-13 NOTE — Therapy (Signed)
[facility] [address] [place], [place], [address] Phone: [phone]   Fax:  [phone]  Speech Language Pathology Treatment  Patient Details  Name: Gregory Moreno MRN: [id number] Date of Birth: 01-28-1995 Referring Provider (SLP): Alger Simons, MD   Encounter Date: 02/13/2019  End of Session - 02/13/19 1633    Visit Number  16    Number of Visits  25    Date for SLP Re-Evaluation  03/17/19    SLP Start Time  1406    SLP Stop Time   1446    SLP Time Calculation (min)  40 min    Activity Tolerance  Patient tolerated treatment well;Patient limited by pain       No past medical history on file.  Past Surgical History:  Procedure Laterality Date  . CRANIOTOMY Left 10/13/2018   Procedure: LEFT CRANIOTOMY FOR TUMOR RESECTION;  Surgeon: Judith Part, MD;  Location: [facility];  Service: Neurosurgery;  Laterality: Left;    There were no vitals filed for this visit.  Subjective Assessment - 02/13/19 1412    Subjective  "Even- eventually the snow-- la - lasts."    Patient is accompained by:  Family member   father   Currently in Pain?  No/denies            ADULT SLP TREATMENT - 02/13/19 1413      General Information   Behavior/Cognition  Alert;Cooperative;Lethargic;Requires cueing;Decreased sustained attention      Treatment Provided   Treatment provided  Cognitive-Linquistic      Cognitive-Linquistic Treatment   Treatment focused on  Aphasia    Skilled Treatment  SLP used photo cards of common very descriptive food to assist pt in increasing naming ability in specific circumstances. SLP provided pt a list of descriptive terms (shape, composition, taste, color, etc) and reviewed this with pt prior to task. SLP provided pt usual mod-max cues for this task. Pt was assisted best by SLP writing a target word, slowly. Many perseverative verbal errors, many semantic paraphasias today, without awareness.  PT remains dificult to hear, with intelligilibty approx 60%.      Assessment / Recommendations / Plan   Plan  Continue with current plan of care      Progression Toward Goals   Progression toward goals  Progressing toward goals         SLP Short Term Goals - 01/30/19 1508      SLP SHORT TERM GOAL #1   Title  pt will complete aphasia assessment of auditory comprehension and verbal expression within 1-2 sessions    Status  Achieved      SLP SHORT TERM GOAL #2   Title  pt will demo understanding of more complex commands (spoken) with 80% and occasional min cues over three sessions    Status  Not Met      SLP SHORT TERM GOAL #3   Title  pt will answer pertinent questions with at least 3 words    Status  Not Met      SLP SHORT TERM GOAL #4   Title  pt will demo sustained/selective attention necessary to complete efficacious speech therapy in 6 of the first 8 sessions    Baseline  12-31-18, 01-13-19    Period  --   or 8 sessions   Status  Partially Met       SLP Long Term Goals - 02/13/19 1635      SLP LONG TERM GOAL #1  Title  pt will demo understanding of commands (spoken) with 85% and rare min cues over three sessions    Baseline  02-06-19    Time  1    Period  Weeks   or 17 sessions, for all LTGs   Status  On-going      SLP LONG TERM GOAL #2   Title  pt will answer questions with [facility] verbal responses 75% of the time, in 3 sessions    Baseline  01-27-19    Time  1    Period  Weeks    Status  On-going      SLP LONG TERM GOAL #3   Title  pt will sustain loud /a/ with average mid 70s dB over 4 sessions    Time  5    Period  Weeks    Status  Deferred   due to focus on language     SLP LONG TERM GOAL #4   Title  pt will engage in simple conversation for 5 minutes with average loudness mid 60s dB over 3 sessions    Time  5    Period  Weeks    Status  Deferred   due to focus on language      Plan - 02/13/19 1633    Clinical Impression Statement  Pt with mod  Broca's aphasia, dysarthria/voice, and cognitive communication deficits. Father reports pt stated a longer sentence than he has in the past x2 this week. Pt cont to require consistent SLP cues for verbl communication, pt better with written cues. Pt would cont to benefit from skilled ST focusing on primarily receptive and expressive language but also at some point in rehab process focus on speech loudness and cognitive communication skills. Pt cont to require skilled ST focusing primarily on language skills.    Speech Therapy Frequency  2x / week    Duration  --   12 weeks or 25 total visits   Treatment/Interventions  Environmental controls;Functional tasks;Compensatory techniques;Multimodal communcation approach;SLP instruction and feedback;Cueing hierarchy;Language facilitation;Cognitive reorganization;Internal/external aids;Patient/family education    Potential to Achieve Goals  Good    Potential Considerations  Co-morbidities;Ability to learn/carryover information;Severity of impairments;Other (comment)       Patient will benefit from skilled therapeutic intervention in order to improve the following deficits and impairments:   Aphasia  Cognitive communication deficit  Dysarthria and anarthria    Problem List Patient Active Problem List   Diagnosis Date Noted  . Goals of care, counseling/discussion 12/16/2018  . Palliative care by specialist   . 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 Spastic hemiparesis ([place])   . Cerebral edema (HCC)   . Intracranial tumor ([place])   . Steroid-induced hyperglycemia   . Spastic hemiplegia affecting nondominant side ([place])   . Hyponatremia   . Transaminitis   . Leucocytosis   . Right spastic hemiplegia ([place]) 10/21/2018  . Dysphagia 10/21/2018  . 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 Brain mass   . ICH (intracerebral hemorrhage) ([place]) 10/13/2018    [facility] ,[place], [place]  02/13/2019, 4:35 PM  [facility] [address] [place], [place], [address] Phone: [phone]   Fax:  [phone]   Name: Gregory Moreno MRN: [id number] Date of Birth: May 14, 1995

## 2019-02-13 NOTE — Therapy (Signed)
[facility] [address] [place], [place], [address] Phone: [phone]   Fax:  [phone]  Occupational Therapy Treatment  Patient Details  Name: Gregory Moreno MRN: [id number] Date of Birth: [birth year] Referring Provider (OT): Mar[place]e [place]   Encounter Date: 02/13/2019  OT End of Session - 02/13/19 1702    Visit Number  17    Number of Visits  25    Date for OT Re-Evaluation  03/17/19    OT Start Time  1535    OT Stop Time  1620    OT Time Calculation (min)  45 min    Activity Tolerance  Patient tolerated treatment well    Behavior During Therapy  [facility] for tasks assessed/performed;Anxious       History reviewed. No pertinent past medical history.  Past Surgical History:  Procedure Laterality Date  . CRANIOTOMY Left 10/13/2018   Procedure: LEFT CRANIOTOMY FOR TUMOR RESECTION;  Surgeon: Judith Part, MD;  Location: [facility];  Service: Neurosurgery;  Laterality: Left;    There were no vitals filed for this visit.  Subjective Assessment - 02/13/19 1657    Subjective   I used this hand like we did - (right arm for rolling)    Patient is accompanied by:  Family member    Currently in Pain?  No/denies    Pain Score  0-No pain                   OT Treatments/Exercises (OP) - 02/13/19 0001      Neurological Re-education Exercises   Other Exercises 1  Neuromuscular reeducation to address weight bearing through right side.  Initially worked on rolling onto right side, then loading right arm, and training push into surface to lift body upward.  Patient with ability to sustain shoulder stabilizer contraction for several seconds this session.  No reportof discomfort.  Able to demonstrate active scapular protraction in supine.  Worked on palce and hold activity with RUE for more forced use concept.  Patient still with right sided inattention.  BUE exercises with dowel, patient able tp sustain active elbow  extension, and even control small directional changes.      Other Exercises 2  Modified plantigrade to load long arm in standing.  Patient better able to accept weight over active RLE and RUE( with support)  Followed with active functional movement in hand - grasp/ release pen, click ball pointpen, and release.                 OT Short Term Goals - 01/30/19 1630      OT SHORT TERM GOAL #2   Title  Patient will reach forward with shoulder flexion, elbow extension pattern to make contact with target in midline and 8-12' from chest with intermittent assist    Status  Achieved      OT SHORT TERM GOAL #3   Title  Patient will transfer from wheelchair to commode with no more than supervision assistance - transferring either toward right or toward left side    Status  Achieved      OT SHORT TERM GOAL #4   Title  Patient will grasp and release a cylindrical object 2-3" in diameter with min assist    Status  Achieved      OT SHORT TERM GOAL #5   Title  Patient will dress lower body with mod assist    Status  Achieved  OT Long Term Goals - 01/30/19 1630      OT LONG TERM GOAL #1   Title  Patient will dress himself with no greater than set up assistance    Status  On-going      OT LONG TERM GOAL #2   Title  Patient will shower with supervision    Status  On-going      OT LONG TERM GOAL #3   Title  Patient will cut food on plate with modified independence    Status  On-going      OT LONG TERM GOAL #4   Title  Patient will demonstrate ability to retrieve a lightweight item (less than 2lb) from waist height or lower and transport 6-12 inches laterally with RUE    Status  On-going      OT LONG TERM GOAL #5   Title  Patient will demonstrate RUE  mid level reach with minimal LUE support to hit a target at chest height directly in front of body    Status  On-going            Plan - 02/13/19 1702    Clinical Impression Statement  Patient with more marked improvement  this week in functional mobility - transfers, rolling, stepping, activity tolerance, position tolerance, language skills - some full spontaneous sentences, and active movement in RUE.  Patient to start a stronger dose of chemotherapy next week, and parents indicate that he may experience nausea with this dosage.  Will monitor ability to participate in rehab during cancer treatment.    OT Frequency  2x / week    OT Duration  12 weeks    OT Treatment/Interventions  Self-care/ADL training;DME and/or AE instruction;Splinting;Balance training;Aquatic Therapy;Therapeutic activities;Therapeutic exercise;Cognitive remediation/compensation;Neuromuscular education;Functional Mobility Training;Visual/perceptual remediation/compensation;Electrical Stimulation;Manual Therapy;Patient/family education    Plan  NMR RUE/TRUNK, control of more than one joint - attempt some functional use (forced use)    Consulted and Agree with Plan of Care  Patient;Family member/caregiver    Family Member Consulted  dad       Patient will benefit from skilled therapeutic intervention in order to improve the following deficits and impairments:           Visit Diagnosis: Hemiplegia of right dominant side due to noncerebrovascular etiology, unspecified hemiplegia type ([place])  Apraxia  Visuospatial deficit  Unsteadiness on feet  Muscle weakness (generalized)  Other symptoms and signs involving the nervous system    Problem List Patient Active Problem List   Diagnosis Date Noted  . Goals of care, counseling/discussion 12/16/2018  . Palliative care by specialist   . 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 Spastic hemiparesis ([place])   . Cerebral edema (HCC)   . Intracranial tumor ([place])   . Steroid-induced hyperglycemia   . Spastic hemiplegia affecting nondominant side ([place])   . Hyponatremia   . Transaminitis   . Leucocytosis   . Right spastic hemiplegia ([place]) 10/21/2018  . Dysphagia  10/21/2018  . 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 Brain mass   . ICH (intracerebral hemorrhage) ([place]) 10/13/2018    Mariah Milling, OTR/L 02/13/2019, 5:06 PM  [facility] [address] [place], [place], [address] Phone: [phone]   Fax:  [phone]  Name: Gregory Moreno MRN: [id number] Date of Birth: Jul 23, 1995

## 2019-02-15 NOTE — Therapy (Signed)
[facility] [address] [place], [place], [address] Phone: [phone]   Fax:  [phone]  Physical Therapy Treatment  Patient Details  Name: Gregory Moreno MRN: [id number] Date of Birth: 02-15-1995 Referring Provider (PT): Alger Simons, MD   Encounter Date: 02/13/2019  PT End of Session - 02/15/19 1939    Visit Number  17    Number of Visits  25    Authorization Type  [employer] - $25 co pay - collect 1 copay, VL: 30 combined (PT/OT/ST), counts as 1 as done on same day    Authorization - Visit Number  7    Authorization - Number of Visits  30    PT Start Time  8016    PT Stop Time  1533    PT Time Calculation (min)  45 min    Equipment Utilized During Treatment  Gait belt    Activity Tolerance  Patient tolerated treatment well    Behavior During Therapy  [facility] for tasks assessed/performed;Anxious       No past medical history on file.  Past Surgical History:  Procedure Laterality Date  . CRANIOTOMY Left 10/13/2018   Procedure: LEFT CRANIOTOMY FOR TUMOR RESECTION;  Surgeon: Judith Part, MD;  Location: [facility];  Service: Neurosurgery;  Laterality: Left;    There were no vitals filed for this visit.                    [facility] Adult PT Treatment/Exercise - 02/15/19 0001      Transfers   Transfers  Sit to Stand;Stand to Sit    Sit to Stand  5: Supervision;4: Min guard    Sit to Stand Details  Verbal cues for technique;Visual cues/gestures for sequencing;Verbal cues for sequencing    Sit to Stand Details (indicate cue type and reason)  At elevated mat table 2 x 5 reps sit <> stands with no UE support and focus on eccentric control when lowering back to mat table. In standing verbal and tactile cues for R weight shift and quad activation before sitting back down to mat. In standing, holding a mini squat grabbing a cone from pt's L (on pt's wheel chair seat) with LUE and passing it to PT on pt's R for  weight shifting and strengthening  x3 reps while performing with 3 cones. During 3rd set pt unable to maintain mini squat and shifted weight posteriorly and sat back down on mat table.    Squat Pivot Transfers  5: Supervision    Squat Pivot Transfer Details (indicate cue type and reason)  from w/c > mat table      Ambulation/Gait   Ambulation/Gait  Yes    Ambulation/Gait Assistance  4: Min assist;3: Mod assist    Ambulation/Gait Assistance Details  Ambulated with RW and R hand splint, R AFO - cues for standing tall on RLE and activating R quad during stance before stepping LLE through. Manual cues also provided for weight shifting and standing tall. Verbal and visual cues for widened step length, as pt with tendency to have more narrow BOS. Pt needing intermittent standing breaks between to re-set and bring RW closer to pt.    Ambulation Distance (Feet)  115 Feet    Assistive device  Other (Comment);Rolling walker   R hand orthosis, R AFO   Gait Pattern  Decreased stance time - right;Decreased step length - left;Decreased weight shift to right;Decreased hip/knee flexion - right;Right genu recurvatum;Trunk flexed;Narrow base of  support;Poor foot clearance - right;Step-through pattern;Decreased dorsiflexion - right    Ambulation Surface  Level;Indoor      Neuro Re-ed    Neuro Re-ed Details   Standing in // bars: 1 x 5 reps step ups with RLE onto 4" step with min A for safety and holding onto // bar with LUE, pt with increased clonus before stepping onto step. Cues for standing tall and quad activation when standing on top, pt now able to initiate R hip flexion against gravity. Standing in // bars with no UE support:2 x 5 reps head turns, with intermittent cues for weight shift and standing tall through RLE.               PT Short Term Goals - 01/14/19 1122      PT SHORT TERM GOAL #1   Title  Patient and pt's family will be independent with ongoing HEP designed to improve movement and  strength in RLE.  ALL STGS DUE 02/11/19    Time  4    Period  Weeks    Status  On-going    Target Date  02/11/19      PT SHORT TERM GOAL #2   Title  Patient will stand at countertop for at least 8 minutes with supervision to increase tolerance for ADLs.    Baseline  did not have time to formally assess on 01/13/19    Period  Weeks    Status  On-going      PT SHORT TERM GOAL #3   Title  Pt will perform stand pivot vs. squat pivot transfer to either R or L from wc to mat table with mod I  in order to decrease caregiver burden.    Baseline  needs initially cueing for technique, min guard for balance for squat pivot transfer    Time  4    Period  Weeks    Status  Revised      PT SHORT TERM GOAL #4   Title  Pt will perform all bed mobility with supervision in order to decrease caregiver burden.    Baseline  needs min A to assist with rolling and for balance for performing sidelying > seated at edge of bed.    Time  4    Period  Weeks    Status  Revised      PT SHORT TERM GOAL #5   Title  Pt will ambulate at least 86' with RW, R AFO and min guard in order to improve household mobility.    Baseline  45' with RW, R AFO and R hand orthosis with min/mod A for balance and RW navigation.    Time  4    Period  Weeks    Status  Not Met        PT Long Term Goals - 12/18/18 1827      PT LONG TERM GOAL #1   Title  Patient and pt's family will be independent with final HEP designed to improve movement and strength in RLE.  ALL LTGS DUE 03/12/19    Time  12    Period  Weeks    Status  New    Target Date  03/12/19      PT LONG TERM GOAL #2   Title  Pt will ambulate at least 300' with supervision and hemiwalker vs. LRAD in order to improve community mobility.    Time  12    Period  Weeks    Status  New      PT LONG TERM GOAL #3   Title  Pt will perform at least 10 sit <> stands from mat table with proper technique with no cueing and mod I in order to improve functional transfers and  demo improved LE strength.    Time  12    Period  Weeks    Status  New      PT LONG TERM GOAL #4   Title  Pt will perform all bed mobility from level mat table with mod I in order to decrease caregiver burden.    Time  12    Period  Weeks    Status  New      PT LONG TERM GOAL #5   Title  Pt will perform 4 steps with step to pattern and single handrail with supervision.    Time  12    Period  Weeks    Status  New            Plan - 02/15/19 1939    Clinical Impression Statement  Focus of today's session was LE strengthening, gait training, and standing balance. Pt demonstrating improved gait distance with RW before needing a rest break, continues to need cues for R quad activation in stance as well as a widened BOS. Pt today able to perform R hip flexion against gravity and perform R step ups onto 4" step. Pt with increased clonus in RLE before performing step ups. Also able to progress standing balance with head turns with more equal weight bearing today with no UE support. Pt is progressing well - will continue to progress towards LTGs.    Personal Factors and Comorbidities  --   pt ungergoing chemo/radiation   Examination-Activity Limitations  Bed Mobility;Locomotion Level;Sit;Squat;Stairs;Stand;Transfers    Examination-Participation Restrictions  Community Activity;School;Driving    Stability/Clinical Decision Making  Evolving/Moderate complexity    Rehab Potential  Good    PT Frequency  2x / week    PT Duration  12 weeks    PT Treatment/Interventions  ADLs/Self Care Home Management;Therapeutic exercise;Therapeutic activities;Functional mobility training;Stair training;Gait training;DME Instruction;Balance training;Neuromuscular re-education;Patient/family education;Orthotic Fit/Training;Wheelchair mobility training;Energy conservation;Passive range of motion    PT Next Visit Plan  check STGs. continue gait with RW (begin gait training with pt's mom and she is fearful to walk pt  at home), hip flexor/quad stretching. tall kneeling when appropriate and have a 2nd set of hands,  sit <> stands from higher mat table,mini squat holds w/ weight shifting.  NMR for R weight shift (steps with LLE, step taps to 2"/4" step) Add to HEP when appropriate.    PT Home Exercise Plan  [id number]    Consulted and Agree with Plan of Care  Patient;Family member/caregiver    Family Member Consulted  dad, [place]       Patient will benefit from skilled therapeutic intervention in order to improve the following deficits and impairments:  Abnormal gait, Decreased activity tolerance, Decreased balance, Decreased cognition, Decreased coordination, Decreased safety awareness, Decreased range of motion, Decreased mobility, Difficulty walking, Decreased strength, Decreased endurance, Impaired tone  Visit Diagnosis: Unsteadiness on feet  Muscle weakness (generalized)  Other symptoms and signs involving the nervous system     Problem List Patient Active Problem List   Diagnosis Date Noted  . Goals of care, counseling/discussion 12/16/2018  . Palliative care by specialist   . Glioblastoma multiforme of temporal lobe ([facility]) 11/11/2018  . Spastic hemiparesis ([facility])   . Cerebral edema (HCC)   .  Intracranial tumor ([facility])   . Steroid-induced hyperglycemia   . Spastic hemiplegia affecting nondominant side ([facility])   . Hyponatremia   . Transaminitis   . Leucocytosis   . Right spastic hemiplegia ([place] [facility]) 10/21/2018  . Dysphagia 10/21/2018  . 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 Brain mass   . ICH (intracerebral hemorrhage) ([facility]) 10/13/2018    Arliss Journey, PT, DPT  02/15/2019, 7:43 PM  [facility] [address] [place], [place], [address] Phone: [phone]   Fax:  [phone]  Name: Gregory Moreno MRN: [id number] Date of Birth: May 09, 1995

## 2019-02-16 ENCOUNTER — Encounter: Payer: Self-pay | Admitting: Occupational Therapy

## 2019-02-16 ENCOUNTER — Ambulatory Visit: Payer: 59 | Attending: Gastroenterology | Admitting: Occupational Therapy

## 2019-02-16 ENCOUNTER — Ambulatory Visit: Payer: 59 | Admitting: Physical Therapy

## 2019-02-16 ENCOUNTER — Other Ambulatory Visit: Payer: Self-pay

## 2019-02-16 ENCOUNTER — Ambulatory Visit: Payer: 59

## 2019-02-16 DIAGNOSIS — R2681 Unsteadiness on feet: Secondary | ICD-10-CM | POA: Insufficient documentation

## 2019-02-16 DIAGNOSIS — R4701 Aphasia: Secondary | ICD-10-CM

## 2019-02-16 DIAGNOSIS — R482 Apraxia: Secondary | ICD-10-CM | POA: Diagnosis present

## 2019-02-16 DIAGNOSIS — R41841 Cognitive communication deficit: Secondary | ICD-10-CM

## 2019-02-16 DIAGNOSIS — M6281 Muscle weakness (generalized): Secondary | ICD-10-CM | POA: Diagnosis present

## 2019-02-16 DIAGNOSIS — R471 Dysarthria and anarthria: Secondary | ICD-10-CM | POA: Diagnosis present

## 2019-02-16 DIAGNOSIS — R41842 Visuospatial deficit: Secondary | ICD-10-CM | POA: Diagnosis present

## 2019-02-16 DIAGNOSIS — G8191 Hemiplegia, unspecified affecting right dominant side: Secondary | ICD-10-CM | POA: Insufficient documentation

## 2019-02-16 DIAGNOSIS — R29818 Other symptoms and signs involving the nervous system: Secondary | ICD-10-CM

## 2019-02-16 DIAGNOSIS — R2689 Other abnormalities of gait and mobility: Secondary | ICD-10-CM | POA: Insufficient documentation

## 2019-02-16 NOTE — Patient Instructions (Signed)
Review instrument panel each day with pictures

## 2019-02-16 NOTE — Therapy (Signed)
[facility] [address] [place], [place], [address] Phone: [phone]   Fax:  [phone]  Occupational Therapy Treatment  Patient Details  Name: Gregory Moreno MRN: [id number] Date of Birth: [birth year] Referring Provider (OT): Marlowe [place]   Encounter Date: 02/16/2019  OT End of Session - 02/16/19 1802    Visit Number  18    Number of Visits  25    Date for OT Re-Evaluation  03/17/19    OT Start Time  [id number]    OT Stop Time  1615    OT Time Calculation (min)  45 min    Activity Tolerance  Patient tolerated treatment well    Behavior During Therapy  [facility] for tasks assessed/performed;Anxious       History reviewed. No pertinent past medical history.  Past Surgical History:  Procedure Laterality Date  . CRANIOTOMY Left 10/13/2018   Procedure: LEFT CRANIOTOMY FOR TUMOR RESECTION;  Surgeon: Judith Part, MD;  Location: [facility];  Service: Neurosurgery;  Laterality: Left;    There were no vitals filed for this visit.  Subjective Assessment - 02/16/19 1758    Subjective   Patient and his father flew together this weekend in patient's plane    Patient is accompanied by:  Family member    Currently in Pain?  No/denies    Pain Score  0-No pain                   OT Treatments/Exercises (OP) - 02/16/19 0001      Neurological Re-education Exercises   Other Exercises 1  Neuromuscular reeducation to address RUE active movement and control while standing.  Worked at stairs, sliding RUE up/down inclined surface, stepping up with LLE to address functional strength in RLE.  Working to address gross motor coordination - arms and legs working simultaneously - although each with unique tasks.  Worked first in seated, then in standing with UE ranger on vertical surface.  Working to train arm to move independently of body.  Patient tends to move very quickly and power through trunk to initiate movement in his arm.   With cueing, demonstration and facilitation, patient able to start to isolate arm movement from trunk.                 OT Short Term Goals - 01/30/19 1630      OT SHORT TERM GOAL #2   Title  Patient will reach forward with shoulder flexion, elbow extension pattern to make contact with target in midline and 8-12' from chest with intermittent assist    Status  Achieved      OT SHORT TERM GOAL #3   Title  Patient will transfer from wheelchair to commode with no more than supervision assistance - transferring either toward right or toward left side    Status  Achieved      OT SHORT TERM GOAL #4   Title  Patient will grasp and release a cylindrical object 2-3" in diameter with min assist    Status  Achieved      OT SHORT TERM GOAL #5   Title  Patient will dress lower body with mod assist    Status  Achieved        OT Long Term Goals - 01/30/19 1630      OT LONG TERM GOAL #1   Title  Patient will dress himself with no greater than set up assistance    Status  On-going  OT LONG TERM GOAL #2   Title  Patient will shower with supervision    Status  On-going      OT LONG TERM GOAL #3   Title  Patient will cut food on plate with modified independence    Status  On-going      OT LONG TERM GOAL #4   Title  Patient will demonstrate ability to retrieve a lightweight item (less than 2lb) from waist height or lower and transport 6-12 inches laterally with RUE    Status  On-going      OT LONG TERM GOAL #5   Title  Patient will demonstrate RUE  mid level reach with minimal LUE support to hit a target at chest height directly in front of body    Status  On-going            Plan - 02/16/19 1803    Clinical Impression Statement  Patient continues with progress in functional mobility, language, and UE movement - starts chemo tonight, so will need to monitor ability to tolerate rehab.  Patient has done well prior with ths chemo - but dose will consistently be increased over  next few months.    OT Frequency  2x / week    OT Duration  12 weeks    OT Treatment/Interventions  Self-care/ADL training;DME and/or AE instruction;Splinting;Balance training;Aquatic Therapy;Therapeutic activities;Therapeutic exercise;Cognitive remediation/compensation;Neuromuscular education;Functional Mobility Training;Visual/perceptual remediation/compensation;Electrical Stimulation;Manual Therapy;Patient/family education    Plan  NMR RUE/TRUNK, control of more than one joint - attempt some functional use (forced use)    Consulted and Agree with Plan of Care  Patient;Family member/caregiver       Patient will benefit from skilled therapeutic intervention in order to improve the following deficits and impairments:           Visit Diagnosis: Unsteadiness on feet  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 Goals of care, counseling/discussion 12/16/2018  . Palliative care by specialist   . Glioblastoma multiforme of temporal lobe ([facility]) 11/11/2018  . Spastic hemiparesis ([facility])   . Cerebral edema (HCC)   . Intracranial tumor ([facility])   . Steroid-induced hyperglycemia   . Spastic hemiplegia affecting nondominant side ([facility])   . Hyponatremia   . Transaminitis   . Leucocytosis   . Right spastic hemiplegia ([facility]) 10/21/2018  . Dysphagia 10/21/2018  . 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 Brain mass   . ICH (intracerebral hemorrhage) ([facility]) 10/13/2018    Mariah Milling, OTR/L 02/16/2019, 6:05 PM  [facility] [address] [place], [place], [address] Phone: [phone]   Fax:  [phone]  Name: Gregory Moreno MRN: [id number] Date of Birth: [birth year]

## 2019-02-16 NOTE — Therapy (Signed)
[facility] [address] [place], [place], [address] Phone: [phone]   Fax:  [phone]  Speech Language Pathology Treatment  Patient Details  Name: Gregory Moreno MRN: [id number] Date of Birth: October 30, 1995 Referring Provider (SLP): Alger Simons, MD   Encounter Date: 02/16/2019  End of Session - 02/16/19 2154    Visit Number  17    Number of Visits  25    Date for SLP Re-Evaluation  03/17/19    SLP Start Time  0093    SLP Stop Time   1446    SLP Time Calculation (min)  41 min    Activity Tolerance  Patient tolerated treatment well       History reviewed. No pertinent past medical history.  Past Surgical History:  Procedure Laterality Date  . CRANIOTOMY Left 10/13/2018   Procedure: LEFT CRANIOTOMY FOR TUMOR RESECTION;  Surgeon: Judith Part, MD;  Location: [facility];  Service: Neurosurgery;  Laterality: Left;    There were no vitals filed for this visit.  Subjective Assessment - 02/16/19 2147    Subjective  Hi.    Patient is accompained by:  Family member    Currently in Pain?  No/denies            ADULT SLP TREATMENT - 02/16/19 2147      General Information   Behavior/Cognition  Alert;Cooperative;Lethargic;Requires cueing;Decreased sustained attention      Treatment Provided   Treatment provided  Cognitive-Linquistic      Cognitive-Linquistic Treatment   Treatment focused on  Aphasia    Skilled Treatment  SLP used pt's iPad with him today on [employer] - pt with approx 85-90% success with tasks however SLP had pt read the response prior to pressing it and pt with 75% success speaking the word correctly. Errors included the other word/another response word or a semantic paraphasia to the target word. Pt aware of errors <20% of the time. SLP then did divergent naming task with pt for cockpit items and pt req'd max-total A for 3/3 items on the instrument panel. [name] responses with picture cues of  the instruments still req'd max A. SLP encouraged father to go through these with pt every day, as they are salient and high-interest items for him.       Assessment / Recommendations / Plan   Plan  Continue with current plan of care      Progression Toward Goals   Progression toward goals  Progressing toward goals   father states pt making longer sentences more frequently      SLP Education - 02/16/19 2154    Education Details  home tasks    Person(s) Educated  Patient;Parent(s)    Methods  Explanation;Demonstration;Verbal cues    Comprehension  Verbalized understanding;Returned demonstration;Verbal cues required;Need further instruction       SLP Short Term Goals - 01/30/19 1508      SLP SHORT TERM GOAL #1   Title  pt will complete aphasia assessment of auditory comprehension and verbal expression within 1-2 sessions    Status  Achieved      SLP SHORT TERM GOAL #2   Title  pt will demo understanding of more complex commands (spoken) with 80% and occasional min cues over three sessions    Status  Not Met      SLP SHORT TERM GOAL #3   Title  pt will answer pertinent questions with at least 3 words    Status  Not Met  SLP SHORT TERM GOAL #4   Title  pt will demo sustained/selective attention necessary to complete efficacious speech therapy in 6 of the first 8 sessions    Baseline  12-31-18, 01-13-19    Period  --   or 8 sessions   Status  Partially Met       SLP Long Term Goals - 02/16/19 2208      SLP LONG TERM GOAL #1   Title  pt will demo understanding of commands (spoken) with 85% and rare min cues over three sessions    Baseline  02-06-19    Time  1    Period  Weeks   or 17 sessions, for all LTGs   Status  On-going      SLP LONG TERM GOAL #2   Title  pt will answer questions with [facility] verbal responses 75% of the time, in 3 sessions    Baseline  01-27-19    Time  1    Period  Weeks    Status  On-going      SLP LONG TERM GOAL #3   Title  pt will sustain  loud /a/ with average mid 70s dB over 4 sessions    Time  5    Period  Weeks    Status  Deferred   due to focus on language     SLP LONG TERM GOAL #4   Title  pt will engage in simple conversation for 5 minutes with average loudness mid 60s dB over 3 sessions    Time  5    Period  Weeks    Status  Deferred   due to focus on language      Plan - 02/16/19 2207    Clinical Impression Statement  Pt with mod Broca's aphasia, dysarthria/voice, and cognitive communication deficits. Pt cont to require consistent SLP cues for verbal communication, pt better with written cues. See "skilled intervention" for more details on today's session. Pt would cont to benefit from skilled ST focusing on primarily receptive and expressive language but also at some point in rehab process focus on speech loudness and cognitive communication skills. Pt cont to require skilled ST focusing primarily on language skills.    Speech Therapy Frequency  2x / week    Duration  --   12 weeks or 25 total visits   Treatment/Interventions  Environmental controls;Functional tasks;Compensatory techniques;Multimodal communcation approach;SLP instruction and feedback;Cueing hierarchy;Language facilitation;Cognitive reorganization;Internal/external aids;Patient/family education    Potential to Achieve Goals  Good    Potential Considerations  Co-morbidities;Ability to learn/carryover information;Severity of impairments;Other (comment)       Patient will benefit from skilled therapeutic intervention in order to improve the following deficits and impairments:   Aphasia  Cognitive communication deficit  Dysarthria and anarthria    Problem List Patient Active Problem List   Diagnosis Date Noted  . Goals of care, counseling/discussion 12/16/2018  . Palliative care by specialist   . Glioblastoma multiforme of temporal lobe ([facility]) 11/11/2018  . Spastic hemiparesis ([place])   . Cerebral edema (HCC)   . Intracranial tumor ([place])    . Steroid-induced hyperglycemia   . Spastic hemiplegia affecting nondominant side ([place])   . Hyponatremia   . Transaminitis   . Leucocytosis   . Right spastic hemiplegia ([place]) 10/21/2018  . Dysphagia 10/21/2018  . 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 Brain mass   . ICH (intracerebral hemorrhage) ([facility]) 10/13/2018    [place] ,[place], [place]  02/16/2019, 10:09 PM  [facility] [address] [place], [place], [address] Phone: [phone]   Fax:  [phone]   Name: TRAVERS GOODLEY MRN: [id number] Date of Birth: [birth year]

## 2019-02-18 NOTE — Therapy (Signed)
[facility] [address] [place], [place], [address] Phone: [phone]   Fax:  [phone]  Physical Therapy Treatment  Patient Details  Name: Gregory Moreno MRN: [id number] Date of Birth: 1995/01/26 Referring Provider (PT): Alger Simons, MD   Encounter Date: 02/16/2019  PT End of Session - 02/18/19 1126    Visit Number  18    Number of Visits  25    Authorization Type  [employer] - $25 co pay - collect 1 copay, VL: 30 combined (PT/OT/ST), counts as 1 as done on same day    Authorization - Visit Number  8    Authorization - Number of Visits  30    PT Start Time  1450    PT Stop Time  1533    PT Time Calculation (min)  43 min    Equipment Utilized During Treatment  Gait belt    Activity Tolerance  Patient tolerated treatment well    Behavior During Therapy  [facility] for tasks assessed/performed       No past medical history on file.  Past Surgical History:  Procedure Laterality Date  . CRANIOTOMY Left 10/13/2018   Procedure: LEFT CRANIOTOMY FOR TUMOR RESECTION;  Surgeon: Judith Part, MD;  Location: [facility];  Service: Neurosurgery;  Laterality: Left;    There were no vitals filed for this visit.                    [facility] Adult PT Treatment/Exercise - 02/18/19 1134      Bed Mobility   Rolling Right  Other (comment);Contact Guard/Touching assist   initial cues for rolling    Supine to Sit  Minimal Assistance - Patient > 75%   needed cues to drop LEs off mat, min A for pelvis and balanc   Sit to Supine  Supervision/Verbal cueing      Transfers   Transfers  Sit to Stand;Stand to Sit    Sit to Stand  5: Supervision    Sit to Stand Details  Verbal cues for technique;Visual cues/gestures for sequencing;Verbal cues for sequencing    Sit to Stand Details (indicate cue type and reason)  from lower mat table throughout session, initial cueing to not use BUE to stand, pt able to recognize to place RLE in  proper positioning before standing, still has tendency to IR and ADD    Squat Pivot Transfers  5: Supervision    Squat Pivot Transfer Details (indicate cue type and reason)  from w/c > mat table, pt performing with supervision    Comments  pt able to stand and perform dynamic task such as taking off his coat with min guard      Ambulation/Gait   Ambulation/Gait  Yes    Ambulation/Gait Assistance  4: Min assist    Ambulation/Gait Assistance Details  use of theraband placed posterior to pt to help provide tactile cue for upright posture to decr forward pushing of RW during gait today. pt needs tactile and verbal cues to stand tall and activate RLE before stepping through with LLE. performed 20' x 2 with yard sticks on floor for visual cueing for wider step length as pt continues to perform gait with decr step length, contributing to pt needing min A for balance. Pt did well stepping on opposite sides of yard sticks for wider step width.     Ambulation Distance (Feet)  115 Feet   plus additional 2 x 20'    Assistive device  Rolling walker;Other (Comment)   R hand orthosis, R AFO   Gait Pattern  Decreased stance time - right;Decreased step length - left;Decreased weight shift to right;Decreased hip/knee flexion - right;Right genu recurvatum;Trunk flexed;Narrow base of support;Poor foot clearance - right;Step-through pattern;Decreased dorsiflexion - right    Ambulation Surface  Level;Indoor      Exercises   Exercises  Other Exercises    Other Exercises   With brace removed- supine on mat table: performed sustained DF stretch with pt having R knee in flexion (due to incr clonus in R ankle with RLE extended), in this position performed PROM into R hip flexion as well as sustained stretch for R hip extensors, performed PROM and stretch into hip IR. Pt demonstrating improved ROM this session compared to previous session. With RLE off edge of mat: modified Thomas test position for R hip flexor stretch 3 x 30  seconds, pt able to tolerate well. Needed cueing to scoot hips towards edge of mat by performing bridging.                PT Short Term Goals - 02/18/19 1128      PT SHORT TERM GOAL #1   Title  Patient and pt's family will be independent with ongoing HEP designed to improve movement and strength in RLE.  ALL STGS DUE 02/11/19    Baseline  reports performing initial HEP    Time  4    Period  Weeks    Status  Achieved    Target Date  02/11/19      PT SHORT TERM GOAL #2   Title  Patient will stand at countertop for at least 8 minutes with supervision to increase tolerance for ADLs.    Baseline  not assessed at countertop, pt performing standing activities at edge of mat and standing in // bars in previous sessions >5 minutes    Period  Weeks    Status  Partially Met      PT SHORT TERM GOAL #3   Title  Pt will perform stand pivot vs. squat pivot transfer to either R or L from wc to mat table with mod I  in order to decrease caregiver burden.    Baseline  supervision for squat pivot transfer from w/c > mat table    Time  4    Period  Weeks    Status  Partially Met      PT SHORT TERM GOAL #4   Title  Pt will perform all bed mobility with supervision in order to decrease caregiver burden.    Baseline  continues to need min A for supine > sit, especially when having to roll towards pt's R    Time  4    Period  Weeks    Status  Not Met      PT SHORT TERM GOAL #5   Title  Pt will ambulate at least 115' with RW, R AFO and min guard in order to improve household mobility.    Baseline  115' with RW, R AFO and min A    Time  4    Period  Weeks    Status  Not Met        PT Long Term Goals - 02/18/19 1130      PT LONG TERM GOAL #1   Title  Patient and pt's family will be independent with final HEP designed to improve movement and strength in RLE.  ALL LTGS DUE 03/12/19  Time  12    Period  Weeks    Status  On-going      PT LONG TERM GOAL #2   Title  Pt will ambulate at  least 38' with RW and supervision in order to improve household mobility.    Baseline  115' with RW and min guard/min A with R AFO    Time  12    Period  Weeks    Status  New      PT LONG TERM GOAL #3   Title  Pt will perform at least 10 sit <> stands from mat table with proper technique with no cueing and mod I in order to improve functional transfers and demo improved LE strength.    Time  12    Period  Weeks    Status  On-going      PT LONG TERM GOAL #4   Title  Pt will perform all bed mobility from level mat table with mod I in order to decrease caregiver burden.    Time  12    Period  Weeks    Status  On-going      PT LONG TERM GOAL #5   Title  Patient will perform at least 2 steps using single handrail and step to pattern with min A.    Time  12    Period  Weeks    Status  New      Additional Long Term Goals   Additional Long Term Goals  --      PT LONG TERM GOAL #6   Title  --    Time  --    Period  --    Status  --            Plan - 02/18/19 1154    Clinical Impression Statement  Assessed pt's STGs- pt has partially met/not met STGs. Pt is able to improve his standing tolerance throughout session from gait training/standing balance in // bars. Pt also improving with ability to perform sit <> stands from lower surfaces with no UE support. Pt continues to need min A when performing supine > sit, but can perform sit > supine with supervision. Squat pivot transfers also improving with pt being able to perform with supervision - needs intermittent cueing for set up (locking R w/c brake). Pt able to ambulate 115' with RW and R hand orthosis - continues to need min A for balance and cues for standing tall during stance on RLE and activating R quad before stepping through with LLE. did well today with visual cueing of stepping on opposite sides of yard stick on floor for improved step width. Revised LTGs as appropriate. Pt stating chemo monday night- will monitor during  future tx sessions. Will continue to progress towards LTGs.    Personal Factors and Comorbidities  --   pt ungergoing chemo/radiation   Examination-Activity Limitations  Bed Mobility;Locomotion Level;Sit;Squat;Stairs;Stand;Transfers    Examination-Participation Restrictions  Community Activity;School;Driving    Stability/Clinical Decision Making  Evolving/Moderate complexity    Rehab Potential  Good    PT Frequency  2x / week    PT Duration  12 weeks    PT Treatment/Interventions  ADLs/Self Care Home Management;Therapeutic exercise;Therapeutic activities;Functional mobility training;Stair training;Gait training;DME Instruction;Balance training;Neuromuscular re-education;Patient/family education;Orthotic Fit/Training;Wheelchair mobility training;Energy conservation;Passive range of motion    PT Next Visit Plan  dynamic standing balance - bean bag toss, step ups, dynamic balance on compliant surfaces/eyes closed. continue gait with RW or no AD -  when there is a 2nd set of hands, (begin gait training with pt's mom and she is fearful to walk pt at home), try tall kneeling when appropriate and have a 2nd set of hands,  sit <> stands from lower mat table,mini squat holds w/ weight shifting.  NMR for R weight shift. Add to HEP when appropriate.    PT Home Exercise Plan  [id number]    Consulted and Agree with Plan of Care  Patient;Family member/caregiver    Family Member Consulted  dad, [place]       Patient will benefit from skilled therapeutic intervention in order to improve the following deficits and impairments:  Abnormal gait, Decreased activity tolerance, Decreased balance, Decreased cognition, Decreased coordination, Decreased safety awareness, Decreased range of motion, Decreased mobility, Difficulty walking, Decreased strength, Decreased endurance, Impaired tone  Visit Diagnosis: Unsteadiness on feet  Muscle weakness (generalized)  Other symptoms and signs involving the nervous  system     Problem List Patient Active Problem List   Diagnosis Date Noted  . Goals of care, counseling/discussion 12/16/2018  . Palliative care by specialist   . 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 Spastic hemiparesis ([place] [facility])   . Cerebral edema (HCC)   . Intracranial tumor ([place])   . Steroid-induced hyperglycemia   . Spastic hemiplegia affecting nondominant side ([facility])   . Hyponatremia   . Transaminitis   . Leucocytosis   . Right spastic hemiplegia ([place]) 10/21/2018  . Dysphagia 10/21/2018  . 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 Brain mass   . ICH (intracerebral hemorrhage) ([place]) 10/13/2018    Arliss Journey, PT, DPT  02/18/2019, 12:11 PM  [facility] [address] [place], [place], [address] Phone: [phone]   Fax:  [phone]  Name: Gregory Moreno MRN: [id number] Date of Birth: August 23, 1995

## 2019-02-20 ENCOUNTER — Ambulatory Visit: Payer: 59

## 2019-02-20 ENCOUNTER — Ambulatory Visit: Payer: 59 | Admitting: Occupational Therapy

## 2019-02-20 ENCOUNTER — Other Ambulatory Visit: Payer: Self-pay

## 2019-02-20 ENCOUNTER — Encounter: Payer: Self-pay | Admitting: Occupational Therapy

## 2019-02-20 ENCOUNTER — Other Ambulatory Visit: Payer: Self-pay | Admitting: Physical Medicine and Rehabilitation

## 2019-02-20 ENCOUNTER — Ambulatory Visit: Payer: 59 | Admitting: Physical Therapy

## 2019-02-20 ENCOUNTER — Encounter: Payer: Self-pay | Admitting: Physical Therapy

## 2019-02-20 DIAGNOSIS — R41841 Cognitive communication deficit: Secondary | ICD-10-CM

## 2019-02-20 DIAGNOSIS — R2681 Unsteadiness on feet: Secondary | ICD-10-CM

## 2019-02-20 DIAGNOSIS — R41842 Visuospatial deficit: Secondary | ICD-10-CM

## 2019-02-20 DIAGNOSIS — M6281 Muscle weakness (generalized): Secondary | ICD-10-CM

## 2019-02-20 DIAGNOSIS — R4701 Aphasia: Secondary | ICD-10-CM

## 2019-02-20 DIAGNOSIS — G8191 Hemiplegia, unspecified affecting right dominant side: Secondary | ICD-10-CM

## 2019-02-20 DIAGNOSIS — R29818 Other symptoms and signs involving the nervous system: Secondary | ICD-10-CM

## 2019-02-20 DIAGNOSIS — R471 Dysarthria and anarthria: Secondary | ICD-10-CM

## 2019-02-20 DIAGNOSIS — R482 Apraxia: Secondary | ICD-10-CM

## 2019-02-20 NOTE — Therapy (Signed)
[facility] [address] [place], [place], [address] Phone: [phone]   Fax:  [phone]  Speech Language Pathology Treatment  Patient Details  Name: Gregory Moreno MRN: [id number] Date of Birth: Jan 24, 1995 Referring Provider (SLP): Alger Simons, MD   Encounter Date: 02/20/2019  End of Session - 02/20/19 1503    Visit Number  18    Number of Visits  25    Date for SLP Re-Evaluation  03/17/19    Authorization - Visit Number  9   in 2021   SLP Start Time  5790    SLP Stop Time   1446    SLP Time Calculation (min)  43 min       No past medical history on file.  Past Surgical History:  Procedure Laterality Date  . CRANIOTOMY Left 10/13/2018   Procedure: LEFT CRANIOTOMY FOR TUMOR RESECTION;  Surgeon: Judith Part, MD;  Location: [facility];  Service: Neurosurgery;  Laterality: Left;    There were no vitals filed for this visit.  Subjective Assessment - 02/20/19 1409    Subjective  Pt waved to SLP in lobby.    Patient is accompained by:  Family member   dad   Currently in Pain?  No/denies            ADULT SLP TREATMENT - 02/20/19 1459      General Information   Behavior/Cognition  Alert;Cooperative;Lethargic;Requires cueing;Decreased sustained attention      Treatment Provided   Treatment provided  Cognitive-Linquistic      Cognitive-Linquistic Treatment   Treatment focused on  Aphasia    Skilled Treatment  SLP used pt's iPad with him today on [employer] - pt with approx 90% success with simple listening tasks. SLP had pt read the picture name prior to pressing it and pt with 85% success errors (pt unaware) included perseveration on prior pictures and othe rsemanantic paraphasias. SLP then engaged in topic of conversation salient to pt (soloing for the first time) with max A for language generation. "I don't know" was pt's response 30% of the time.       Assessment / Recommendations / Plan   Plan  Continue with current plan of care      Progression Toward Goals   Progression toward goals  Not progressing toward goals (comment)   severity of deficit        SLP Short Term Goals - 01/30/19 1508      SLP SHORT TERM GOAL #1   Title  pt will complete aphasia assessment of auditory comprehension and verbal expression within 1-2 sessions    Status  Achieved      SLP SHORT TERM GOAL #2   Title  pt will demo understanding of more complex commands (spoken) with 80% and occasional min cues over three sessions    Status  Not Met      SLP SHORT TERM GOAL #3   Title  pt will answer pertinent questions with at least 3 words    Status  Not Met      SLP SHORT TERM GOAL #4   Title  pt will demo sustained/selective attention necessary to complete efficacious speech therapy in 6 of the first 8 sessions    Baseline  12-31-18, 01-13-19    Period  --   or 8 sessions   Status  Partially Met       SLP Long Term Goals - 02/20/19 1505      SLP LONG  TERM GOAL #1   Title  pt will demo understanding of commands (spoken) with 85% and rare min cues over three sessions    Baseline  02-06-19    Time  1    Period  Weeks   or 17 sessions, for all LTGs   Status  On-going      SLP LONG TERM GOAL #2   Title  pt will answer questions with [facility] verbal responses 75% of the time, in 3 sessions    Baseline  01-27-19    Time  1    Period  Weeks    Status  On-going      SLP LONG TERM GOAL #3   Title  pt will sustain loud /a/ with average mid 70s dB over 4 sessions    Time  5    Period  Weeks    Status  Deferred   due to focus on language     SLP LONG TERM GOAL #4   Title  pt will engage in simple conversation for 5 minutes with average loudness mid 60s dB over 3 sessions    Time  5    Period  Weeks    Status  Deferred   due to focus on language      Plan - 02/20/19 1504    Clinical Impression Statement  Pt with mod Broca's aphasia, dysarthria/voice, and cognitive communication deficits.  Pt cont to require consistent SLP cues for verbal communication, pt better with written cues. SLP was hopeful that once pt's rad ended that would see more and more success but that has not occurred yet, to date. See "skilled intervention" for more details on today's session. Pt would cont to benefit from skilled ST focusing on primarily receptive and expressive language but also at some point in rehab process focus on speech loudness and cognitive communication skills. Pt cont to require skilled ST focusing primarily on language skills.    Speech Therapy Frequency  2x / week    Duration  --   12 weeks or 25 total visits   Treatment/Interventions  Environmental controls;Functional tasks;Compensatory techniques;Multimodal communcation approach;SLP instruction and feedback;Cueing hierarchy;Language facilitation;Cognitive reorganization;Internal/external aids;Patient/family education    Potential to Achieve Goals  Good    Potential Considerations  Co-morbidities;Ability to learn/carryover information;Severity of impairments;Other (comment)       Patient will benefit from skilled therapeutic intervention in order to improve the following deficits and impairments:   Aphasia  Cognitive communication deficit  Dysarthria and anarthria    Problem List Patient Active Problem List   Diagnosis Date Noted  . Goals of care, counseling/discussion 12/16/2018  . Palliative care by specialist   . Glioblastoma multiforme of temporal lobe ([facility]) 11/11/2018  . Spastic hemiparesis ([place])   . Cerebral edema (HCC)   . Intracranial tumor ([place])   . Steroid-induced hyperglycemia   . Spastic hemiplegia affecting nondominant side ([facility] [facility])   . Hyponatremia   . Transaminitis   . Leucocytosis   . Right spastic hemiplegia ([place]) 10/21/2018  . Dysphagia 10/21/2018  . 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 Brain mass   . ICH (intracerebral hemorrhage) ([facility]) 10/13/2018    [facility] ,[place], [place]  02/20/2019, 3:06  PM  [facility] [address] [place], [place], [address] Phone: [phone]   Fax:  [phone]   Name: Gregory Moreno MRN: [id number] Date of Birth: 1995/08/25

## 2019-02-20 NOTE — Therapy (Signed)
[facility] [address] [place], [place], [address] Phone: [phone]   Fax:  [phone]  Occupational Therapy Treatment  Patient Details  Name: Gregory Moreno MRN: [id number] Date of Birth: 19-Jul-1995 Referring Provider (OT): Marlowe [place]   Encounter Date: 02/20/2019  OT End of Session - 02/20/19 [id number]    Visit Number  19    Number of Visits  25    Date for OT Re-Evaluation  03/17/19    OT Start Time  1530    OT Stop Time  1612    OT Time Calculation (min)  42 min    Activity Tolerance  Patient tolerated treatment well    Behavior During Therapy  [facility] for tasks assessed/performed       History reviewed. No pertinent past medical history.  Past Surgical History:  Procedure Laterality Date  . CRANIOTOMY Left 10/13/2018   Procedure: LEFT CRANIOTOMY FOR TUMOR RESECTION;  Surgeon: Judith Part, MD;  Location: [facility];  Service: Neurosurgery;  Laterality: Left;    There were no vitals filed for this visit.  Subjective Assessment - 02/20/19 1637    Subjective   Not much - patient is having friends visit this weekend    Patient is accompanied by:  Family member    Currently in Pain?  No/denies    Pain Score  0-No pain                   OT Treatments/Exercises (OP) - 02/20/19 0001      Neurological Re-education Exercises   Other Exercises 1  Neuromuscular reeducation to address postural control  In standing worked on assisted froward reach, grasp and release on 1 inch blocks (box and blocks)  Patient unable to open hand to approach blocks, but emphasisi was on releasing flexors in digits to release blocks while standing.  Also worked in functional conditions with RUE in standing to open / close  drawer, or low cabinet.  Trying to force patient to begin to shape hand to surface - eg cabinet handle.               OT Education - 02/20/19 1640    Education Details  Patient with 30 visit limit  for OT/PT/ST - Spoke with patient and father about therapy limit    Person(s) Educated  Patient;Parent(s)    Methods  Explanation    Comprehension  Need further instruction;Verbalized understanding       OT Short Term Goals - 01/30/19 1630      OT SHORT TERM GOAL #2   Title  Patient will reach forward with shoulder flexion, elbow extension pattern to make contact with target in midline and 8-12' from chest with intermittent assist    Status  Achieved      OT SHORT TERM GOAL #3   Title  Patient will transfer from wheelchair to commode with no more than supervision assistance - transferring either toward right or toward left side    Status  Achieved      OT SHORT TERM GOAL #4   Title  Patient will grasp and release a cylindrical object 2-3" in diameter with min assist    Status  Achieved      OT SHORT TERM GOAL #5   Title  Patient will dress lower body with mod assist    Status  Achieved        OT Long Term Goals - 01/30/19 1630  OT LONG TERM GOAL #1   Title  Patient will dress himself with no greater than set up assistance    Status  On-going      OT LONG TERM GOAL #2   Title  Patient will shower with supervision    Status  On-going      OT LONG TERM GOAL #3   Title  Patient will cut food on plate with modified independence    Status  On-going      OT LONG TERM GOAL #4   Title  Patient will demonstrate ability to retrieve a lightweight item (less than 2lb) from waist height or lower and transport 6-12 inches laterally with RUE    Status  On-going      OT LONG TERM GOAL #5   Title  Patient will demonstrate RUE  mid level reach with minimal LUE support to hit a target at chest height directly in front of body    Status  On-going            Plan - 02/20/19 1642    Clinical Impression Statement  Patient progressing with stamina and functional mobility.  Still limited by hemiplegia, apraxia, and diminished attention.    Body Structure / Function / Physical  Skills  ADL;Decreased knowledge of use of DME;Gait;Strength;Balance;Dexterity;GMC;Tone;Body mechanics;Hearing;Proprioception;UE functional use;Endurance;IADL;ROM;Vestibular;Vision;Coordination;Flexibility;Mobility;Sensation;FMC;Decreased knowledge of precautions    OT Frequency  2x / week    OT Duration  12 weeks    OT Treatment/Interventions  Self-care/ADL training;DME and/or AE instruction;Splinting;Balance training;Aquatic Therapy;Therapeutic activities;Therapeutic exercise;Cognitive remediation/compensation;Neuromuscular education;Functional Mobility Training;Visual/perceptual remediation/compensation;Electrical Stimulation;Manual Therapy;Patient/family education    Plan  NMR RUE/TRUNK, control of more than one joint - attempt some functional use (forced use)    Consulted and Agree with Plan of Care  Patient;Family member/caregiver    Family Member Consulted  dad       Patient will benefit from skilled therapeutic intervention in order to improve the following deficits and impairments:   Body Structure / Function / Physical Skills: ADL, Decreased knowledge of use of DME, Gait, Strength, Balance, Dexterity, GMC, Tone, Body mechanics, Hearing, Proprioception, UE functional use, Endurance, IADL, ROM, Vestibular, Vision, Coordination, Flexibility, Mobility, Sensation, FMC, Decreased knowledge of precautions       Visit Diagnosis: Unsteadiness on feet  Hemiplegia of right dominant side due to noncerebrovascular etiology, unspecified hemiplegia type (HCC)  Apraxia  Visuospatial deficit  Other symptoms and signs involving the nervous system  Muscle weakness (generalized)    Problem List Patient Active Problem List   Diagnosis Date Noted  . Goals of care, counseling/discussion 12/16/2018  . Palliative care by specialist   . 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 Spastic hemiparesis ([place])   . Cerebral edema (HCC)   . Intracranial tumor ([place])   .  Steroid-induced hyperglycemia   . Spastic hemiplegia affecting nondominant side ([place])   . Hyponatremia   . Transaminitis   . Leucocytosis   . Right spastic hemiplegia ([place]) 10/21/2018  . Dysphagia 10/21/2018  . 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 Brain mass   . ICH (intracerebral hemorrhage) ([place]) 10/13/2018    Mariah Milling, OTR/L 02/20/2019, 4:53 PM  [facility] [address] [place], [place], [address] Phone: [phone]   Fax:  [phone]  Name: Gregory Moreno MRN: [id number] Date of Birth: 05-31-1995

## 2019-02-20 NOTE — Telephone Encounter (Signed)
Is it okay to refill Lexapro? No mention in last note to continue.

## 2019-02-21 ENCOUNTER — Other Ambulatory Visit: Payer: Self-pay | Admitting: Physical Medicine and Rehabilitation

## 2019-02-22 NOTE — Therapy (Signed)
[facility] [address] [place], [place], [address] Phone: [phone]   Fax:  [phone]  Physical Therapy Treatment  Patient Details  Name: Gregory Moreno MRN: [id number] Date of Birth: 12/18/1995 Referring Provider (PT): Alger Simons, MD   Encounter Date: 02/20/2019  PT End of Session - 02/22/19 2139    Visit Number  19    Number of Visits  25    Authorization Type  [employer] - $25 co pay - collect 1 copay, VL: 30 combined (PT/OT/ST), counts as 1 as done on same day    Authorization - Visit Number  9    Authorization - Number of Visits  30    PT Start Time  1450    PT Stop Time  1535    PT Time Calculation (min)  45 min    Equipment Utilized During Treatment  Gait belt    Activity Tolerance  Patient tolerated treatment well    Behavior During Therapy  [facility] for tasks assessed/performed       History reviewed. No pertinent past medical history.  Past Surgical History:  Procedure Laterality Date  . CRANIOTOMY Left 10/13/2018   Procedure: LEFT CRANIOTOMY FOR TUMOR RESECTION;  Surgeon: Judith Part, MD;  Location: [facility];  Service: Neurosurgery;  Laterality: Left;    There were no vitals filed for this visit.           02/20/19 1455  Symptoms/Limitations  Subjective doesn't feel too bad from chemo. no falls.  Limitations Sitting;Walking;Standing  How long can you walk comfortably? 100' with hemiwalker  Patient Stated Goals wants to improve his walking, get back to normal  Pain Assessment  Currently in Pain? No/denies              [facility] Adult PT Treatment/Exercise - 02/22/19 0001      Ambulation/Gait   Ambulation/Gait  Yes    Ambulation/Gait Assistance  3: Mod assist    Ambulation/Gait Assistance Details  Ambulated with no AD with OT and PT - OT towards pt's L and PT on rolling stool on pt's R - providing mod A for balance and weight shifting, cues for standing up tall and activating  quads during stance before taking step with LLE, as well as cues for increased step width, with PT intermittently having to place RLE due to pt taking too much of adducted steps. 2 bouts (2 x 50'), pt needing a seated break in w/c halfway due to being fatigued after standing throughout entirety of session. Pt performing a more fluid gait pattern during 2nd lap after cues to perform gait "gently", with less episodes of R genu recurvatum.     Ambulation Distance (Feet)  100 Feet   approx, in 2 bouts   Assistive device  None;Other (Comment)   2 person assist - PT, OT   Gait Pattern  Decreased stance time - right;Decreased step length - left;Decreased weight shift to right;Decreased hip/knee flexion - right;Right genu recurvatum;Trunk flexed;Narrow base of support;Poor foot clearance - right;Step-through pattern;Decreased dorsiflexion - right    Ambulation Surface  Level;Indoor      Neuro Re-ed    Neuro Re-ed Details   NMR: with 2nd hand from OT: standing in // bars with BUE support, step ups with LLE to 4" step for weight shifting towards RLE, pt initially with tendency to perform L hip flexion with increase trunk flexion, needed verbal and visual cueing (with assist from mirror) for upright posture. Massed practice with  varying speeds of tapping to step, needs min A from PT to block R knee to prevent genu recurvatum and for balance, progressing to no UE support. OT asking pt to lift his L hand to touch OT's hand (to reach superiorly), pt with incr fear when trying to perform and unable to activate R quads in standing to weight shift to R and tap with LLE. With OT holding bean bags, pt reaching with LUE laterally and superiorly as well as performing mini squat to pick up bean bag from floor and tossing it into crate approx. 10' away with no UE support in // bars, mod A for balance from PT posteriorly due to pt having increased forward lean when tossing bean bag. Verbal and tactile cues for weight shift and  standing tall through RLE.                PT Short Term Goals - 02/18/19 1128      PT SHORT TERM GOAL #1   Title  Patient and pt's family will be independent with ongoing HEP designed to improve movement and strength in RLE.  ALL STGS DUE 02/11/19    Baseline  reports performing initial HEP    Time  4    Period  Weeks    Status  Achieved    Target Date  02/11/19      PT SHORT TERM GOAL #2   Title  Patient will stand at countertop for at least 8 minutes with supervision to increase tolerance for ADLs.    Baseline  not assessed at countertop, pt performing standing activities at edge of mat and standing in // bars in previous sessions >5 minutes    Period  Weeks    Status  Partially Met      PT SHORT TERM GOAL #3   Title  Pt will perform stand pivot vs. squat pivot transfer to either R or L from wc to mat table with mod I  in order to decrease caregiver burden.    Baseline  supervision for squat pivot transfer from w/c > mat table    Time  4    Period  Weeks    Status  Partially Met      PT SHORT TERM GOAL #4   Title  Pt will perform all bed mobility with supervision in order to decrease caregiver burden.    Baseline  continues to need min A for supine > sit, especially when having to roll towards pt's R    Time  4    Period  Weeks    Status  Not Met      PT SHORT TERM GOAL #5   Title  Pt will ambulate at least 115' with RW, R AFO and min guard in order to improve household mobility.    Baseline  115' with RW, R AFO and min A    Time  4    Period  Weeks    Status  Not Met        PT Long Term Goals - 02/18/19 1130      PT LONG TERM GOAL #1   Title  Patient and pt's family will be independent with final HEP designed to improve movement and strength in RLE.  ALL LTGS DUE 03/12/19    Time  12    Period  Weeks    Status  On-going      PT LONG TERM GOAL #2   Title  Pt will ambulate at  least 71' with RW and supervision in order to improve household mobility.     Baseline  115' with RW and min guard/min A with R AFO    Time  12    Period  Weeks    Status  New      PT LONG TERM GOAL #3   Title  Pt will perform at least 10 sit <> stands from mat table with proper technique with no cueing and mod I in order to improve functional transfers and demo improved LE strength.    Time  12    Period  Weeks    Status  On-going      PT LONG TERM GOAL #4   Title  Pt will perform all bed mobility from level mat table with mod I in order to decrease caregiver burden.    Time  12    Period  Weeks    Status  On-going      PT LONG TERM GOAL #5   Title  Patient will perform at least 2 steps using single handrail and step to pattern with min A.    Time  12    Period  Weeks    Status  New      Additional Long Term Goals   Additional Long Term Goals  --      PT LONG TERM GOAL #6   Title  --    Time  --    Period  --    Status  --            Plan - 02/22/19 2140    Clinical Impression Statement  Focus of today's session was dynamic standing balance, weight shifting to RLE, and gait training. Pt with improvement in standing tolerance throughout today's session. Pt needed seat rest break before gait and after first bout of gait due to increased fatigue. Pt with increased fear performing weight shifting towards RLE for step ups with LLE without LUE support in bars. Pt with more automatic gait pattern during 2nd bout of gait and with less episodes of R genu recurvatum. Will continue to progress towards LTGs.    Personal Factors and Comorbidities  --   pt ungergoing chemo/radiation   Examination-Activity Limitations  Bed Mobility;Locomotion Level;Sit;Squat;Stairs;Stand;Transfers    Examination-Participation Restrictions  Community Activity;School;Driving    Stability/Clinical Decision Making  Evolving/Moderate complexity    Rehab Potential  Good    PT Frequency  2x / week    PT Duration  12 weeks    PT Treatment/Interventions  ADLs/Self Care Home  Management;Therapeutic exercise;Therapeutic activities;Functional mobility training;Stair training;Gait training;DME Instruction;Balance training;Neuromuscular re-education;Patient/family education;Orthotic Fit/Training;Wheelchair mobility training;Energy conservation;Passive range of motion    PT Next Visit Plan  step taps for weight shift, dynamic balance on compliant surfaces/eyes closed. continue gait with RW or no AD - when there is a 2nd set of hands, (begin gait training with pt's mom and she is fearful to walk pt at home), try tall kneeling when appropriate and have a 2nd set of hands,  sit <> stands from lower mat table,mini squat holds w/ weight shifting.  NMR for R weight shift. Add to HEP when appropriate.    PT Home Exercise Plan  [id number]    Consulted and Agree with Plan of Care  Patient;Family member/caregiver    Family Member Consulted  dad, Alieu       Patient will benefit from skilled therapeutic intervention in order to improve the following deficits and impairments:  Abnormal gait,  Decreased activity tolerance, Decreased balance, Decreased cognition, Decreased coordination, Decreased safety awareness, Decreased range of motion, Decreased mobility, Difficulty walking, Decreased strength, Decreased endurance, Impaired tone  Visit Diagnosis: Unsteadiness on feet  Other symptoms and signs involving the nervous system  Muscle weakness (generalized)     Problem List Patient Active Problem List   Diagnosis Date Noted  . Goals of care, counseling/discussion 12/16/2018  . Palliative care by specialist   . 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 Spastic hemiparesis ([place])   . Cerebral edema (HCC)   . Intracranial tumor ([place])   . Steroid-induced hyperglycemia   . Spastic hemiplegia affecting nondominant side ([place])   . Hyponatremia   . Transaminitis   . Leucocytosis   . Right spastic hemiplegia ([place]) 10/21/2018  . Dysphagia 10/21/2018  . 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 Brain mass   . ICH (intracerebral hemorrhage) ([place]) 10/13/2018    Arliss Journey, PT, DPT  02/22/2019, 9:43 PM  [facility] [address] [place], [place], [address] Phone: [phone]   Fax:  [phone]  Name: BODEN STUCKY MRN: [id number] Date of Birth: 13-Oct-1995

## 2019-02-24 ENCOUNTER — Other Ambulatory Visit: Payer: Self-pay

## 2019-02-24 ENCOUNTER — Ambulatory Visit: Payer: 59 | Admitting: Occupational Therapy

## 2019-02-24 ENCOUNTER — Encounter: Payer: Self-pay | Admitting: Occupational Therapy

## 2019-02-24 ENCOUNTER — Ambulatory Visit: Payer: 59

## 2019-02-24 ENCOUNTER — Ambulatory Visit: Payer: 59 | Admitting: Physical Therapy

## 2019-02-24 DIAGNOSIS — R4701 Aphasia: Secondary | ICD-10-CM

## 2019-02-24 DIAGNOSIS — R41842 Visuospatial deficit: Secondary | ICD-10-CM

## 2019-02-24 DIAGNOSIS — G8191 Hemiplegia, unspecified affecting right dominant side: Secondary | ICD-10-CM

## 2019-02-24 DIAGNOSIS — R482 Apraxia: Secondary | ICD-10-CM

## 2019-02-24 DIAGNOSIS — R2681 Unsteadiness on feet: Secondary | ICD-10-CM

## 2019-02-24 DIAGNOSIS — M6281 Muscle weakness (generalized): Secondary | ICD-10-CM

## 2019-02-24 DIAGNOSIS — R41841 Cognitive communication deficit: Secondary | ICD-10-CM

## 2019-02-24 DIAGNOSIS — R29818 Other symptoms and signs involving the nervous system: Secondary | ICD-10-CM

## 2019-02-24 DIAGNOSIS — R471 Dysarthria and anarthria: Secondary | ICD-10-CM

## 2019-02-24 NOTE — Therapy (Signed)
[facility] [address] [place], [place], [address] Phone: [phone]   Fax:  [phone]  Speech Language Pathology Treatment  Patient Details  Name: Gregory Moreno MRN: [id number] Date of Birth: [birth year] Referring Provider (SLP): Alger Simons, MD   Encounter Date: 02/24/2019  End of Session - 02/24/19 1642    Visit Number  19    Number of Visits  25    Date for SLP Re-Evaluation  03/17/19    Authorization - Visit Number  10    SLP Start Time  0156    SLP Stop Time   1537    SLP Time Calculation (min)  41 min    Activity Tolerance  Patient tolerated treatment well       History reviewed. No pertinent past medical history.  Past Surgical History:  Procedure Laterality Date  . CRANIOTOMY Left 10/13/2018   Procedure: LEFT CRANIOTOMY FOR TUMOR RESECTION;  Surgeon: Judith Part, MD;  Location: [facility];  Service: Neurosurgery;  Laterality: Left;    There were no vitals filed for this visit.  Subjective Assessment - 02/24/19 1453    Subjective  "We are recouperting from that high dose chemo last week." Pt finished 5 days high dose chemo last week.    Patient is accompained by:  Family member   mom   Currently in Pain?  No/denies            ADULT SLP TREATMENT - 02/24/19 1454      General Information   Behavior/Cognition  Cooperative;Lethargic;Requires cueing      Treatment Provided   Treatment provided  Cognitive-Linquistic      Cognitive-Linquistic Treatment   Treatment focused on  Aphasia    Skilled Treatment  Pt did not bring his iPad with him today. SLP engaging pt in simple conversation about what he had for breakfast and lunch - pt with literal paraphasias/gibberish in response 50% of the time (awareness 60%) - req'd SLP max A 4/5 for success (written, verbal, drawing).       Assessment / Recommendations / Plan   Plan  Continue with current plan of care      Progression Toward  Goals   Progression toward goals  Not progressing toward goals (comment)   medical complications (fatigue from chemo last week)        SLP Short Term Goals - 01/30/19 1508      SLP SHORT TERM GOAL #1   Title  pt will complete aphasia assessment of auditory comprehension and verbal expression within 1-2 sessions    Status  Achieved      SLP SHORT TERM GOAL #2   Title  pt will demo understanding of more complex commands (spoken) with 80% and occasional min cues over three sessions    Status  Not Met      SLP SHORT TERM GOAL #3   Title  pt will answer pertinent questions with at least 3 words    Status  Not Met      SLP SHORT TERM GOAL #4   Title  pt will demo sustained/selective attention necessary to complete efficacious speech therapy in 6 of the first 8 sessions    Baseline  12-31-18, 01-13-19    Period  --   or 8 sessions   Status  Partially Met       SLP Long Term Goals - 02/24/19 1643      SLP LONG TERM GOAL #1   Title  pt will demo understanding of commands (spoken) with 85% and rare min cues over three sessions    Baseline  02-06-19    Time  1    Period  Weeks   or 17 sessions, for all LTGs   Status  On-going      SLP LONG TERM GOAL #2   Title  pt will answer questions with [facility] verbal responses 75% of the time, in 3 sessions    Baseline  01-27-19    Time  1    Period  Weeks    Status  On-going      SLP LONG TERM GOAL #3   Title  pt will sustain loud /a/ with average mid 70s dB over 4 sessions    Time  5    Period  Weeks    Status  Deferred   due to focus on language     SLP LONG TERM GOAL #4   Title  pt will engage in simple conversation for 5 minutes with average loudness mid 60s dB over 3 sessions    Time  5    Period  Weeks    Status  Deferred   due to focus on language      Plan - 02/24/19 1642    Clinical Impression Statement  Pt with mod Broca's aphasia, dysarthria/voice, and cognitive communication deficits. Pt cont to require consistent SLP  cues for verbal communication, pt better with written cues. SLP was hopeful that once pt's rad ended that would see more and more success but that has not occurred yet, to date. See "skilled intervention" for more details on today's session. Pt would cont to benefit from skilled ST focusing on primarily receptive and expressive language but also at some point in rehab process focus on speech loudness and cognitive communication skills. Pt cont to require skilled ST focusing primarily on language skills.    Speech Therapy Frequency  2x / week    Duration  --   12 weeks or 25 total visits   Treatment/Interventions  Environmental controls;Functional tasks;Compensatory techniques;Multimodal communcation approach;SLP instruction and feedback;Cueing hierarchy;Language facilitation;Cognitive reorganization;Internal/external aids;Patient/family education    Potential to Achieve Goals  Good    Potential Considerations  Co-morbidities;Ability to learn/carryover information;Severity of impairments;Other (comment)       Patient will benefit from skilled therapeutic intervention in order to improve the following deficits and impairments:   Aphasia  Dysarthria and anarthria  Cognitive communication deficit    Problem List Patient Active Problem List   Diagnosis Date Noted  . Goals of care, counseling/discussion 12/16/2018  . Palliative care by specialist   . 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 Spastic hemiparesis ([place])   . Cerebral edema (HCC)   . Intracranial tumor ([place])   . Steroid-induced hyperglycemia   . Spastic hemiplegia affecting nondominant side ([place])   . Hyponatremia   . Transaminitis   . Leucocytosis   . Right spastic hemiplegia ([place]) 10/21/2018  . Dysphagia 10/21/2018  . 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 Brain mass   . ICH (intracerebral hemorrhage) ([place]) 10/13/2018    [facility] ,[place], [place]  02/24/2019, 4:43 PM  [facility] [address] [place], [place], [address] Phone: [phone]   Fax:  [phone]   Name: JERIMIE MANCUSO MRN: [id number] Date of Birth: [birth year]

## 2019-02-24 NOTE — Therapy (Signed)
[facility] [address] [place], [place], [address] Phone: [phone]   Fax:  [phone]  Occupational Therapy Treatment  Patient Details  Name: Gregory Moreno MRN: [id number] Date of Birth: 1995-01-28 Referring Provider (OT): Mar[place]e [place]   Encounter Date: 02/24/2019  OT End of Session - 02/24/19 1718    Visit Number  20    Number of Visits  25    Date for OT Re-Evaluation  03/17/19    Authorization Type  30 VL    Authorization - Visit Number  12    Authorization - Number of Visits  30    OT Start Time  [id number]    OT Stop Time  1700    OT Time Calculation (min)  45 min    Activity Tolerance  Patient tolerated treatment well    Behavior During Therapy  [facility] for tasks assessed/performed       History reviewed. No pertinent past medical history.  Past Surgical History:  Procedure Laterality Date  . CRANIOTOMY Left 10/13/2018   Procedure: LEFT CRANIOTOMY FOR TUMOR RESECTION;  Surgeon: Judith Part, MD;  Location: [facility];  Service: Neurosurgery;  Laterality: Left;    There were no vitals filed for this visit.  Subjective Assessment - 02/24/19 1625    Subjective   Patient is no longer using commode over toilet, and able to transfer from toilet to shower with supervision    Patient is accompanied by:  Family member    Currently in Pain?  No/denies    Pain Score  0-No pain                   OT Treatments/Exercises (OP) - 02/24/19 0001      ADLs   Toileting  Patient wearing underwear to therapy today versus briefs.  Mom indicates that patient is now transferring from regular height toilet at home.  She did indicate that she has not encourage him to complete his own hygiene yet.  Patient unable to clean effectively.      Functional Mobility  Mom indicates that patient now transfers into shower with supervision.        Neurological Re-education Exercises   Other Exercises 1  Neuromuscular  reeducation to address postural control in standing and sitting.  Emphasis today on teaching RUE to move separately from trunk.  In supine worked with PVC frame to address active shoulder flexion with elbow extension.  Patient unable to activate movement from depedent arm position, but once aligned in shoulder flexion at ~ 100* able to sustain and then move within ranges from flex/ext, IR/ER, and elbow flex/ext.  Patient has difficulty turning off elbow flex once active to allow arm to extend despite having active elbow extension.  Patient with diminished proprioceptive awareness of RIght side, and apraxia.                 OT Short Term Goals - 01/30/19 1630      OT SHORT TERM GOAL #2   Title  Patient will reach forward with shoulder flexion, elbow extension pattern to make contact with target in midline and 8-12' from chest with intermittent assist    Status  Achieved      OT SHORT TERM GOAL #3   Title  Patient will transfer from wheelchair to commode with no more than supervision assistance - transferring either toward right or toward left side    Status  Achieved      OT  SHORT TERM GOAL #4   Title  Patient will grasp and release a cylindrical object 2-3" in diameter with min assist    Status  Achieved      OT SHORT TERM GOAL #5   Title  Patient will dress lower body with mod assist    Status  Achieved        OT Long Term Goals - 02/24/19 1722      OT LONG TERM GOAL #1   Title  Patient will dress himself with no greater than set up assistance    Status  On-going      OT LONG TERM GOAL #2   Title  Patient will shower with supervision    Status  Achieved      OT LONG TERM GOAL #3   Title  Patient will cut food on plate with modified independence    Status  On-going      OT LONG TERM GOAL #4   Title  Patient will demonstrate ability to retrieve a lightweight item (less than 2lb) from waist height or lower and transport 6-12 inches laterally with RUE    Status  On-going       OT LONG TERM GOAL #5   Title  Patient will demonstrate RUE  mid level reach with minimal LUE support to hit a target at chest height directly in front of body    Status  On-going            Plan - 02/24/19 1719    Clinical Impression Statement  Patient's mom reporting patient was sick over weekend - attributing this to oral meds.  Patient with less energy overall, although still actively participating in therapy sessions. Reviewed OT plan of care, and plan to take break from therapy at 12 week mark, to allow xadditional therapy later in the year if warranted.    OT Frequency  2x / week    OT Duration  12 weeks    OT Treatment/Interventions  Self-care/ADL training;DME and/or AE instruction;Splinting;Balance training;Aquatic Therapy;Therapeutic activities;Therapeutic exercise;Cognitive remediation/compensation;Neuromuscular education;Functional Mobility Training;Visual/perceptual remediation/compensation;Electrical Stimulation;Manual Therapy;Patient/family education    Plan  supine BUE exercises - estabish for HEP, static to dynamic sitting and standing balance, working toward forward reach    [employer] and Agree with Plan of Care  Patient;Family member/caregiver    Family Member Consulted  mom       Patient will benefit from skilled therapeutic intervention in order to improve the following deficits and impairments:           Visit Diagnosis: 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 Goals of care, counseling/discussion 12/16/2018  . Palliative care by specialist   . 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 Spastic hemiparesis ([place])   . Cerebral edema (HCC)   . Intracranial  tumor ([place])   . Steroid-induced hyperglycemia   . Spastic hemiplegia affecting nondominant side ([place])   . Hyponatremia   . Transaminitis   . Leucocytosis   . Right spastic hemiplegia ([place]) 10/21/2018  . Dysphagia 10/21/2018  . 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 Brain mass   . ICH (intracerebral hemorrhage) ([place]) 10/13/2018    Mariah Milling, OTR/L 02/24/2019, 5:23 PM  [facility]  [facility] [address] [place], [place], [address] Phone: [phone]   Fax:  [phone]  Name: SAVIAN HABETZ MRN: [id number] Date of Birth: 1995/05/03

## 2019-02-24 NOTE — Patient Instructions (Signed)
  Continue working on naming some of those instruments in the cockpit, and on parts on the exterior of the plane.

## 2019-02-26 NOTE — Therapy (Signed)
[facility] [address] [place], [place], [address] Phone: [phone]   Fax:  [phone]  Physical Therapy Treatment  Patient Details  Name: Gregory Moreno MRN: [id number] Date of Birth: Aug 12, 1995 Referring Provider (PT): Alger Simons, MD   Encounter Date: 02/24/2019  PT End of Session - 02/25/19 0800    Visit Number  20    Number of Visits  25    Authorization Type  [employer] - $25 co pay - collect 1 copay, VL: 30 combined (PT/OT/ST), counts as 1 as done on same day    Authorization - Visit Number  10    Authorization - Number of Visits  30    PT Start Time  4166    PT Stop Time  1616    PT Time Calculation (min)  43 min    Equipment Utilized During Treatment  Gait belt    Activity Tolerance  Patient tolerated treatment well    Behavior During Therapy  [facility] for tasks assessed/performed       No past medical history on file.  Past Surgical History:  Procedure Laterality Date  . CRANIOTOMY Left 10/13/2018   Procedure: LEFT CRANIOTOMY FOR TUMOR RESECTION;  Surgeon: Judith Part, MD;  Location: [facility];  Service: Neurosurgery;  Laterality: Left;    There were no vitals filed for this visit.      02/24/19 1535  Symptoms/Limitations  Subjective didn't feel too good from over the weekend from the chemo. feeling better today.  Limitations Sitting;Walking;Standing  How long can you walk comfortably? 100' with hemiwalker  Patient Stated Goals wants to improve his walking, get back to normal  Pain Assessment  Currently in Pain? No/denies                   [facility] Adult PT Treatment/Exercise - 02/26/19 0001      Transfers   Transfers  Sit to Stand;Stand to Sit    Sit to Stand  5: Supervision    Sit to Stand Details  Verbal cues for sequencing;Verbal cues for technique    Sit to Stand Details (indicate cue type and reason)  cues for proper foot placement and not using LUE to help assist to stand     Stand to Sit  5: Supervision    Stand to Sit Details (indicate cue type and reason)  Tactile cues for weight shifting;Tactile cues for initiation;Visual cues for safe use of DME/AE;Tactile cues for weight beaing    Stand to Sit Details  cues for eccentric control back down to mat table, during 1st rep pt with no control lowering down - after initial cueing pt able to demonstrate proper eccentric control for remainder of session. needs verbal cues to attend to RUE to remove from R hand orthosis before sitting back down in w/c.      Squat Pivot Transfers  6: Modified independent (Device/Increase time)    Squat Pivot Transfer Details (indicate cue type and reason)  from w/c > mat table    Transfer Cueing  stand step transfer at end of bout of gait to return back to mat table with RW: needed min A for balance and cues for proper foot placement to turn    Comments  1 x 5 reps sit <> stand with no UE support, cues for forward weight shift, standing weight shifting towards R, then sitting back down to mat      Ambulation/Gait   Ambulation/Gait  Yes  Ambulation/Gait Assistance  4: Min assist    Ambulation/Gait Assistance Details  pt with increased spasticity/tightness in R hand today, needed PT to stretch R hand before ambulating with RW and R hand orthosis. During gait cues needed for standing tall and making his "right leg work", pt continues to have a couple episodes of recurvatum on R, more notably when walking around turns. pt better able to initiate today RLE quad activation in stance. Continued to need cues for wider step length B and for visual cue to step towards the colors on the theraband. needed standing rest break halfway through     Ambulation Distance (Feet)  115 Feet    Assistive device  Rolling walker;Other (Comment)   R AFO, R hand orthosis   Gait Pattern  Decreased stance time - right;Decreased step length - left;Decreased weight shift to right;Decreased hip/knee flexion - right;Right  genu recurvatum;Trunk flexed;Narrow base of support;Poor foot clearance - right;Step-through pattern;Decreased dorsiflexion - right    Ambulation Surface  Level;Indoor    Pre-Gait Activities  between each sit <> stand rep, static standing balance with no UE support with mirror in front of pt for visual cueing for midline and RLE activation - approx. 20 seconds of static standing with focus on R quad activation with verbal and tactile cues and verbal cues to shift towards therapist on R to maintain R weight shift before sitting back down      Neuro Re-ed    Neuro Re-ed Details   1 x 10 reps sit <> stands with blue foam under LLE for increased weight bearing and somatosensory input to RLE, standing without UE support, focus on weight shifting towards R in standing, with no UE support for balance.       Exercises   Exercises  Other Exercises    Other Exercises   1 x 10 reps sit <> stands with 2nd therapist assisting providing resistance around pt's pelvis with green theraband - with bias towards R, for incr difficulty, core activation, and hip extensor activation for transfer, needed multi-modal tactile and verbal cues to "stick hips forward and belly button forward" in standing for more glute activation.                PT Short Term Goals - 02/18/19 1128      PT SHORT TERM GOAL #1   Title  Patient and pt's family will be independent with ongoing HEP designed to improve movement and strength in RLE.  ALL STGS DUE 02/11/19    Baseline  reports performing initial HEP    Time  4    Period  Weeks    Status  Achieved    Target Date  02/11/19      PT SHORT TERM GOAL #2   Title  Patient will stand at countertop for at least 8 minutes with supervision to increase tolerance for ADLs.    Baseline  not assessed at countertop, pt performing standing activities at edge of mat and standing in // bars in previous sessions >5 minutes    Period  Weeks    Status  Partially Met      PT SHORT TERM GOAL  #3   Title  Pt will perform stand pivot vs. squat pivot transfer to either R or L from wc to mat table with mod I  in order to decrease caregiver burden.    Baseline  supervision for squat pivot transfer from w/c > mat table    Time  4  Period  Weeks    Status  Partially Met      PT SHORT TERM GOAL #4   Title  Pt will perform all bed mobility with supervision in order to decrease caregiver burden.    Baseline  continues to need min A for supine > sit, especially when having to roll towards pt's R    Time  4    Period  Weeks    Status  Not Met      PT SHORT TERM GOAL #5   Title  Pt will ambulate at least 115' with RW, R AFO and min guard in order to improve household mobility.    Baseline  115' with RW, R AFO and min A    Time  4    Period  Weeks    Status  Not Met        PT Long Term Goals - 02/18/19 1130      PT LONG TERM GOAL #1   Title  Patient and pt's family will be independent with final HEP designed to improve movement and strength in RLE.  ALL LTGS DUE 03/12/19    Time  12    Period  Weeks    Status  On-going      PT LONG TERM GOAL #2   Title  Pt will ambulate at least 92' with RW and supervision in order to improve household mobility.    Baseline  115' with RW and min guard/min A with R AFO    Time  12    Period  Weeks    Status  New      PT LONG TERM GOAL #3   Title  Pt will perform at least 10 sit <> stands from mat table with proper technique with no cueing and mod I in order to improve functional transfers and demo improved LE strength.    Time  12    Period  Weeks    Status  On-going      PT LONG TERM GOAL #4   Title  Pt will perform all bed mobility from level mat table with mod I in order to decrease caregiver burden.    Time  12    Period  Weeks    Status  On-going      PT LONG TERM GOAL #5   Title  Patient will perform at least 2 steps using single handrail and step to pattern with min A.    Time  12    Period  Weeks    Status  New       Additional Long Term Goals   Additional Long Term Goals  --      PT LONG TERM GOAL #6   Title  --    Time  --    Period  --    Status  --             02/25/19 0802  Plan  Clinical Impression Statement Focus of today's skilled session was massed practice of sit <> stands from lower mat table, static standing balance with RLE activation and weight shifting, and gait training. Pt demonstrating improved LE strength during sit <> stands and improved eccentric control today when sitting down to a lower mat table. Pt with increased spasticity in R hand today, needed to be stretched before ambulating with R hand orthosis and RW. Pt demonstrating better knee control during gait, however does still ambulate with a more narrow BOS, especially when fatigued.  Will continue to progress towards LTGs.  Personal Factors and Comorbidities  (pt ungergoing chemo/radiation)  Examination-Activity Limitations Bed Mobility;Locomotion Level;Sit;Squat;Stairs;Stand;Transfers  Examination-Participation Restrictions Community Activity;School;Driving  Pt will benefit from skilled therapeutic intervention in order to improve on the following deficits Abnormal gait;Decreased activity tolerance;Decreased balance;Decreased cognition;Decreased coordination;Decreased safety awareness;Decreased range of motion;Decreased mobility;Difficulty walking;Decreased strength;Decreased endurance;Impaired tone  Stability/Clinical Decision Making Evolving/Moderate complexity  Rehab Potential Good  PT Frequency 2x / week  PT Duration 12 weeks  PT Treatment/Interventions ADLs/Self Care Home Management;Therapeutic exercise;Therapeutic activities;Functional mobility training;Stair training;Gait training;DME Instruction;Balance training;Neuromuscular re-education;Patient/family education;Orthotic Fit/Training;Wheelchair mobility training;Energy conservation;Passive range of motion  PT Next Visit Plan gait training with RW and R hand orthosis  progressing distance and quality of gait/no AD for smaller distances, and maybe try Robin's cone trick for slight turns, NMR for R weight shifting, step taps with LLE, bed mobility/core and trunk strengthening. pt with tendency to have narrow BOS while ambulating - strategies to incr step width/hip ADD and any hip ABD strengthening  PT Home Exercise Plan [id number]  Consulted and Agree with Plan of Care Patient;Family member/caregiver  Family Member Consulted dad, Gottlieb     Patient will benefit from skilled therapeutic intervention in order to improve the following deficits and impairments:  Abnormal gait, Decreased activity tolerance, Decreased balance, Decreased cognition, Decreased coordination, Decreased safety awareness, Decreased range of motion, Decreased mobility, Difficulty walking, Decreased strength, Decreased endurance, Impaired tone  Visit Diagnosis: 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 Goals of care, counseling/discussion 12/16/2018  . Palliative care by specialist   . 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 Spastic hemiparesis ([place])   . Cerebral edema (HCC)   . Intracranial tumor ([place])   . Steroid-induced hyperglycemia   . Spastic hemiplegia affecting nondominant side ([place])   . Hyponatremia   . Transaminitis   . Leucocytosis   . Right spastic hemiplegia ([place]) 10/21/2018  . Dysphagia 10/21/2018  . 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 Brain mass   . ICH (intracerebral hemorrhage) ([place]) 10/13/2018    Arliss Journey, PT, DPT  02/26/2019, 2:35 PM  [facility] [address] [place], [place], [address] Phone: [phone]   Fax:   [phone]  Name: Gregory Moreno MRN: [id number] Date of Birth: Nov 04, 1995

## 2019-02-27 ENCOUNTER — Ambulatory Visit: Payer: 59

## 2019-02-27 ENCOUNTER — Ambulatory Visit: Payer: 59 | Admitting: Occupational Therapy

## 2019-02-27 ENCOUNTER — Encounter: Payer: Self-pay | Admitting: Physical Therapy

## 2019-02-27 ENCOUNTER — Ambulatory Visit: Payer: 59 | Admitting: Physical Therapy

## 2019-02-27 ENCOUNTER — Encounter: Payer: Self-pay | Admitting: Occupational Therapy

## 2019-02-27 ENCOUNTER — Other Ambulatory Visit: Payer: Self-pay

## 2019-02-27 DIAGNOSIS — R2681 Unsteadiness on feet: Secondary | ICD-10-CM | POA: Diagnosis not present

## 2019-02-27 DIAGNOSIS — R29818 Other symptoms and signs involving the nervous system: Secondary | ICD-10-CM

## 2019-02-27 DIAGNOSIS — R482 Apraxia: Secondary | ICD-10-CM

## 2019-02-27 DIAGNOSIS — R41842 Visuospatial deficit: Secondary | ICD-10-CM

## 2019-02-27 DIAGNOSIS — M6281 Muscle weakness (generalized): Secondary | ICD-10-CM

## 2019-02-27 DIAGNOSIS — R2689 Other abnormalities of gait and mobility: Secondary | ICD-10-CM

## 2019-02-27 DIAGNOSIS — G8191 Hemiplegia, unspecified affecting right dominant side: Secondary | ICD-10-CM

## 2019-02-27 DIAGNOSIS — R4701 Aphasia: Secondary | ICD-10-CM

## 2019-02-27 DIAGNOSIS — R41841 Cognitive communication deficit: Secondary | ICD-10-CM

## 2019-02-27 DIAGNOSIS — R471 Dysarthria and anarthria: Secondary | ICD-10-CM

## 2019-02-27 NOTE — Therapy (Signed)
[facility] [address] [place], [place], [address] Phone: [phone]   Fax:  [phone]  Speech Language Pathology Treatment  Patient Details  Name: Gregory Moreno MRN: [id number] Date of Birth: Aug 14, 1995 Referring Provider (SLP): Alger Simons, MD   Encounter Date: 02/27/2019  End of Session - 02/27/19 1536    Visit Number  20    Number of Visits  25    Date for SLP Re-Evaluation  03/17/19    Authorization - Visit Number  11    Authorization - Number of Visits  30    SLP Start Time  4765    SLP Stop Time   1615    SLP Time Calculation (min)  42 min    Activity Tolerance  Patient tolerated treatment well       No past medical history on file.  Past Surgical History:  Procedure Laterality Date  . CRANIOTOMY Left 10/13/2018   Procedure: LEFT CRANIOTOMY FOR TUMOR RESECTION;  Surgeon: Judith Part, MD;  Location: [facility];  Service: Neurosurgery;  Laterality: Left;    There were no vitals filed for this visit.         ADULT SLP TREATMENT - 02/27/19 1542      General Information   Behavior/Cognition  Cooperative;Lethargic;Requires cueing      Treatment Provided   Treatment provided  Cognitive-Linquistic      Cognitive-Linquistic Treatment   Treatment focused on  Aphasia;Patient/family/caregiver education    Skilled Treatment  Father stated pt "said an 8-word sentence" recently. SLP engaged pt in high-interest, familar topic of air flight, and pt's hobbies surrounding air travel to target incr'ing length of utterance and language accuracy. Pt req'd usual max A for word finding. Noted pt used gibberish and semantic paraphasias with some rare WNL speech at the phrase level. Pt was more aware of surroundings/in tune with session today than in previous session this week. Father asking if there is an expected timeline for "aphasia to subside" - SLP did best at explaining that healing still from the rad  tx, pt is getting hit with chemo meds at incr'd dosages/severity every 3 weeks so difficult to pinpoint. Told pt/father that pt's language will always be different than prior to diagnosis, but extent of difference hard to state at this time.      Assessment / Recommendations / Plan   Plan  Continue with current plan of care      Progression Toward Goals   Progression toward goals  Not progressing toward goals (comment)   severity of deficit      SLP Education - 02/27/19 1656    Education Details  positive porgnostic indicators, pt speech will not be like it was prior to dx, concept of neuroplasticity    Person(s) Educated  Patient;Parent(s)    Methods  Explanation    Comprehension  Verbalized understanding       SLP Short Term Goals - 01/30/19 1508      SLP SHORT TERM GOAL #1   Title  pt will complete aphasia assessment of auditory comprehension and verbal expression within 1-2 sessions    Status  Achieved      SLP SHORT TERM GOAL #2   Title  pt will demo understanding of more complex commands (spoken) with 80% and occasional min cues over three sessions    Status  Not Met      SLP SHORT TERM GOAL #3   Title  pt will answer pertinent questions  with at least 3 words    Status  Not Met      SLP SHORT TERM GOAL #4   Title  pt will demo sustained/selective attention necessary to complete efficacious speech therapy in 6 of the first 8 sessions    Baseline  12-31-18, 01-13-19    Period  --   or 8 sessions   Status  Partially Met       SLP Long Term Goals - 02/27/19 1657      SLP LONG TERM GOAL #1   Title  pt will demo understanding of commands (spoken) with 85% and rare min cues over three sessions    Baseline  02-06-19    Time  1    Period  Weeks   or 17 sessions, for all LTGs   Status  On-going      SLP LONG TERM GOAL #2   Title  pt will answer questions with [facility] verbal responses 75% of the time, in 3 sessions    Baseline  01-27-19    Time  1    Period  Weeks     Status  On-going      SLP LONG TERM GOAL #3   Title  pt will sustain loud /a/ with average mid 70s dB over 4 sessions    Time  5    Period  Weeks    Status  Deferred   due to focus on language     SLP LONG TERM GOAL #4   Title  pt will engage in simple conversation for 5 minutes with average loudness mid 60s dB over 3 sessions    Time  5    Period  Weeks    Status  Deferred   due to focus on language      Plan - 02/27/19 1657    Clinical Impression Statement  Pt with mod Broca's aphasia, dysarthria/voice, and cognitive communication deficits. Pt cont to require consistent SLP cues for verbal communication, pt better with written cues. See "skilled intervention" for more details on today's session. Pt would cont to benefit from skilled ST focusing on primarily receptive and expressive language but also at some point in rehab process focus on speech loudness and cognitive communication skills. Pt cont to require skilled ST focusing primarily on language skills.    Speech Therapy Frequency  2x / week    Duration  --   12 weeks or 25 total visits   Treatment/Interventions  Environmental controls;Functional tasks;Compensatory techniques;Multimodal communcation approach;SLP instruction and feedback;Cueing hierarchy;Language facilitation;Cognitive reorganization;Internal/external aids;Patient/family education    Potential to Achieve Goals  Good    Potential Considerations  Co-morbidities;Ability to learn/carryover information;Severity of impairments;Other (comment)       Patient will benefit from skilled therapeutic intervention in order to improve the following deficits and impairments:   Aphasia  Dysarthria and anarthria  Cognitive communication deficit    Problem List Patient Active Problem List   Diagnosis Date Noted  . Goals of care, counseling/discussion 12/16/2018  . Palliative care by specialist   . 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 Spastic  hemiparesis ([place])   . Cerebral edema (HCC)   . Intracranial tumor ([place])   . Steroid-induced hyperglycemia   . Spastic hemiplegia affecting nondominant side ([place])   . Hyponatremia   . Transaminitis   . Leucocytosis   . Right spastic hemiplegia ([place]) 10/21/2018  . Dysphagia 10/21/2018  . 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 Brain mass   .  ICH (intracerebral hemorrhage) ([place]) 10/13/2018    [facility] ,[place], [place]  02/27/2019, 4:58 PM  [facility] [address] [place], [place], [address] Phone: [phone]   Fax:  [phone]   Name: Gregory Moreno MRN: [id number] Date of Birth: 1995-07-09

## 2019-02-27 NOTE — Therapy (Signed)
[facility] [address] [place], [place], [address] Phone: [phone]   Fax:  [phone]  Occupational Therapy Treatment  Patient Details  Name: Gregory Moreno MRN: [id number] Date of Birth: 1995/10/19 Referring Provider (OT): Mar[place]e [place]   Encounter Date: 02/27/2019  OT End of Session - 02/27/19 1641    Visit Number  21    Number of Visits  25    Date for OT Re-Evaluation  03/17/19    Authorization Type  30 VL    Authorization - Visit Number  13    Authorization - Number of Visits  30    OT Start Time  [id number]    OT Stop Time  1445    OT Time Calculation (min)  40 min    Activity Tolerance  Patient tolerated treatment well    Behavior During Therapy  [facility] for tasks assessed/performed       History reviewed. No pertinent past medical history.  Past Surgical History:  Procedure Laterality Date  . CRANIOTOMY Left 10/13/2018   Procedure: LEFT CRANIOTOMY FOR TUMOR RESECTION;  Surgeon: Judith Part, MD;  Location: [facility];  Service: Neurosurgery;  Laterality: Left;    There were no vitals filed for this visit.  Subjective Assessment - 02/27/19 1414    Subjective   Nothing much    Patient is accompanied by:  Family member    Currently in Pain?  No/denies    Pain Score  0-No pain                   OT Treatments/Exercises (OP) - 02/27/19 0001      Neurological Re-education Exercises   Other Exercises 1  Neuromuscular reeducation to address patient's ability to begin to move right arm separately from his body.  Patient able to accept weight through heel of hand in sitting, and activate elbow extensors.  Patient in supine can actiavte elbow extension, but it is coupled with shoulder depression and extension.  If humerus stabilized - he can isolate elbow flex/ext.  He cannot yet combine this with shoulder flexion as needed for reach patterns.      Other Exercises 2  Sidelying toward side sitting on  right side to activate right limbs and trunk into surface.  Patient with improved ability to sustain activation here.  In sitting and standing worked on weight shift right and left of midline with overhead reach ( guided on right)                  OT Short Term Goals - 01/30/19 1630      OT SHORT TERM GOAL #2   Title  Patient will reach forward with shoulder flexion, elbow extension pattern to make contact with target in midline and 8-12' from chest with intermittent assist    Status  Achieved      OT SHORT TERM GOAL #3   Title  Patient will transfer from wheelchair to commode with no more than supervision assistance - transferring either toward right or toward left side    Status  Achieved      OT SHORT TERM GOAL #4   Title  Patient will grasp and release a cylindrical object 2-3" in diameter with min assist    Status  Achieved      OT SHORT TERM GOAL #5   Title  Patient will dress lower body with mod assist    Status  Achieved  OT Long Term Goals - 02/24/19 1722      OT LONG TERM GOAL #1   Title  Patient will dress himself with no greater than set up assistance    Status  On-going      OT LONG TERM GOAL #2   Title  Patient will shower with supervision    Status  Achieved      OT LONG TERM GOAL #3   Title  Patient will cut food on plate with modified independence    Status  On-going      OT LONG TERM GOAL #4   Title  Patient will demonstrate ability to retrieve a lightweight item (less than 2lb) from waist height or lower and transport 6-12 inches laterally with RUE    Status  On-going      OT LONG TERM GOAL #5   Title  Patient will demonstrate RUE  mid level reach with minimal LUE support to hit a target at chest height directly in front of body    Status  On-going            Plan - 02/27/19 1641    Clinical Impression Statement  Patient showing improved functional mobility and safety in static and even slightly dynamic conditions.    OT  Frequency  2x / week    OT Duration  12 weeks    OT Treatment/Interventions  Self-care/ADL training;DME and/or AE instruction;Splinting;Balance training;Aquatic Therapy;Therapeutic activities;Therapeutic exercise;Cognitive remediation/compensation;Neuromuscular education;Functional Mobility Training;Visual/perceptual remediation/compensation;Electrical Stimulation;Manual Therapy;Patient/family education    Plan  supine BUE exercises - estabish for HEP, static to dynamic sitting and standing balance, working toward forward reach    [employer] and Agree with Plan of Care  Patient;Family member/caregiver    Family Member Consulted  DAD       Patient will benefit from skilled therapeutic intervention in order to improve the following deficits and impairments:           Visit Diagnosis: 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 Goals of care, counseling/discussion 12/16/2018  . Palliative care by specialist   . 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 Spastic hemiparesis ([place])   . Cerebral edema (HCC)   . Intracranial tumor ([place])   . Steroid-induced hyperglycemia   . Spastic hemiplegia affecting nondominant side ([place])   . Hyponatremia   . Transaminitis   . Leucocytosis   . Right spastic hemiplegia ([place]) 10/21/2018  . Dysphagia 10/21/2018  . 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 Brain mass   . ICH (intracerebral hemorrhage) ([place]) 10/13/2018    Mariah Milling, OTR/L 02/27/2019, 4:43 PM  [facility] [address] [place], [place], [address] Phone: [phone]   Fax:  [phone]  Name: Gregory Moreno MRN:  [id number] Date of Birth: 05/18/1995

## 2019-02-27 NOTE — Patient Instructions (Signed)
Continue to have conversations about the things that highly interest you. Continue to do the Talk Path Therapy

## 2019-02-28 NOTE — Therapy (Signed)
[facility] [address] [place], [place], [address] Phone: [phone]   Fax:  [phone]  Physical Therapy Treatment  Patient Details  Name: Gregory Moreno MRN: [id number] Date of Birth: 1995-12-24 Referring Provider (PT): Alger Simons, MD   Encounter Date: 02/27/2019  PT End of Session - 02/28/19 1136    Visit Number  21    Number of Visits  25    Date for PT Re-Evaluation  03/12/19    Authorization Type  [employer] - $25 co pay - collect 1 copay, VL: 30 combined (PT/OT/ST), counts as 1 as done on same day    Authorization - Visit Number  13   count updated for 2021-PT/OT/ST combined   Authorization - Number of Visits  30    PT Start Time  1448    PT Stop Time  1535    PT Time Calculation (min)  47 min    Activity Tolerance  Patient tolerated treatment well    Behavior During Therapy  [facility] for tasks assessed/performed       History reviewed. No pertinent past medical history.  Past Surgical History:  Procedure Laterality Date  . CRANIOTOMY Left 10/13/2018   Procedure: LEFT CRANIOTOMY FOR TUMOR RESECTION;  Surgeon: Judith Part, MD;  Location: [facility];  Service: Neurosurgery;  Laterality: Left;    There were no vitals filed for this visit.  Subjective Assessment - 02/27/19 1457    Subjective  Just finished with OT, no issues to report.    Limitations  Sitting;Walking;Standing    How long can you walk comfortably?  100' with hemiwalker    Patient Stated Goals  wants to improve his walking, get back to normal    Currently in Pain?  No/denies                       [facility] Adult PT Treatment/Exercise - 02/27/19 1511      Ambulation/Gait   Ambulation/Gait  Yes    Ambulation/Gait Assistance  4: Min assist    Ambulation/Gait Assistance Details  Pt required verbal cues and visual cues to widen BOS when advancing RLE; also required tactile cues for weight shift fully to L during L stance to allow  for full step length RLE; continues to present with R genu recurvatum in stance phase    Ambulation Distance (Feet)  75 Feet    Assistive device  Rolling walker;Other (Comment)    Ambulation Surface  Level;Indoor      Neuro Re-ed    Neuro Re-ed Details   Sit > stand training with L foot wedged on 4" step x 10 reps to continue to facilitate active weight shift and activation of RLE.  Required verbal and tactile cues to maintain weight shift and control descent back to seated.  Performed perched sitting on bolster for increased quad activation; performed lateral weight shifting to R through trunk elongation and then cued pt to lift L foot off floor for further activation of R quad x 8 reps.  Transitioned to rockerboard placed laterally and focused on lateral weight shifting from pelvis and maintaining head and shoulders in midline with mod A from therapist.      Knee/Hip Exercises: Seated   Sit to Sand  1 set;without UE support   8 reps, L foot on 4" step to weight shift to R         Balance Exercises - 02/28/19 1129      Balance  Exercises: Standing   Rockerboard  Lateral;EO;10 reps;UE support          PT Short Term Goals - 02/18/19 1128      PT SHORT TERM GOAL #1   Title  Patient and pt's family will be independent with ongoing HEP designed to improve movement and strength in RLE.  ALL STGS DUE 02/11/19    Baseline  reports performing initial HEP    Time  4    Period  Weeks    Status  Achieved    Target Date  02/11/19      PT SHORT TERM GOAL #2   Title  Patient will stand at countertop for at least 8 minutes with supervision to increase tolerance for ADLs.    Baseline  not assessed at countertop, pt performing standing activities at edge of mat and standing in // bars in previous sessions >5 minutes    Period  Weeks    Status  Partially Met      PT SHORT TERM GOAL #3   Title  Pt will perform stand pivot vs. squat pivot transfer to either R or L from wc to mat table with mod  I  in order to decrease caregiver burden.    Baseline  supervision for squat pivot transfer from w/c > mat table    Time  4    Period  Weeks    Status  Partially Met      PT SHORT TERM GOAL #4   Title  Pt will perform all bed mobility with supervision in order to decrease caregiver burden.    Baseline  continues to need min A for supine > sit, especially when having to roll towards pt's R    Time  4    Period  Weeks    Status  Not Met      PT SHORT TERM GOAL #5   Title  Pt will ambulate at least 115' with RW, R AFO and min guard in order to improve household mobility.    Baseline  115' with RW, R AFO and min A    Time  4    Period  Weeks    Status  Not Met        PT Long Term Goals - 02/18/19 1130      PT LONG TERM GOAL #1   Title  Patient and pt's family will be independent with final HEP designed to improve movement and strength in RLE.  ALL LTGS DUE 03/12/19    Time  12    Period  Weeks    Status  On-going      PT LONG TERM GOAL #2   Title  Pt will ambulate at least 31' with RW and supervision in order to improve household mobility.    Baseline  115' with RW and min guard/min A with R AFO    Time  12    Period  Weeks    Status  New      PT LONG TERM GOAL #3   Title  Pt will perform at least 10 sit <> stands from mat table with proper technique with no cueing and mod I in order to improve functional transfers and demo improved LE strength.    Time  12    Period  Weeks    Status  On-going      PT LONG TERM GOAL #4   Title  Pt will perform all bed mobility from level mat table with  mod I in order to decrease caregiver burden.    Time  12    Period  Weeks    Status  On-going      PT LONG TERM GOAL #5   Title  Patient will perform at least 2 steps using single handrail and step to pattern with min A.    Time  12    Period  Weeks    Status  New      Additional Long Term Goals   Additional Long Term Goals  --      PT LONG TERM GOAL #6   Title  --    Time  --     Period  --    Status  --            Plan - 02/28/19 1140    Clinical Impression Statement  Continued to utilize NMR to facilitate active weight shift to R and improve postural/head righting during weight shifting.  Still requires moderate-maximum cues during gait for weight shifting and attention to placement of RLE.  Pt tolerated well but when performing standing weight shifting on rockerboard pt experienced increased trembling and fatigue in bilat LE.  Will continue to address in order to progress towards LTG.    Personal Factors and Comorbidities  --   pt ungergoing chemo/radiation   Examination-Activity Limitations  Bed Mobility;Locomotion Level;Sit;Squat;Stairs;Stand;Transfers    Examination-Participation Restrictions  Community Activity;School;Driving    Stability/Clinical Decision Making  Evolving/Moderate complexity    Rehab Potential  Good    PT Frequency  2x / week    PT Duration  12 weeks    PT Treatment/Interventions  ADLs/Self Care Home Management;Therapeutic exercise;Therapeutic activities;Functional mobility training;Stair training;Gait training;DME Instruction;Balance training;Neuromuscular re-education;Patient/family education;Orthotic Fit/Training;Wheelchair mobility training;Energy conservation;Passive range of motion    PT Next Visit Plan  Weight shifting training - perched sitting on wedge or bolster edge of mat for increased LE extension activation, weight shift R - lift LLE, rockerboard -seated and standing for postural control/trunk righting.  gait training with RW and R hand orthosis progressing distance and quality of gait/no AD for smaller distances, NMR for R weight shifting, step taps with LLE, bed mobility/core and trunk strengthening. pt with tendency to have narrow BOS while ambulating - strategies to incr step width/hip ADD and any hip ABD strengthening    PT Home Exercise Plan  [id number]    Consulted and Agree with Plan of Care  Patient;Family  member/caregiver    Family Member Consulted  dad, Paxton       Patient will benefit from skilled therapeutic intervention in order to improve the following deficits and impairments:  Abnormal gait, Decreased activity tolerance, Decreased balance, Decreased cognition, Decreased coordination, Decreased safety awareness, Decreased range of motion, Decreased mobility, Difficulty walking, Decreased strength, Decreased endurance, Impaired tone  Visit Diagnosis: Muscle weakness (generalized)  Unsteadiness on feet  Other symptoms and signs involving the nervous system  Other abnormalities of gait and mobility  Hemiplegia of right dominant side due to noncerebrovascular etiology, unspecified hemiplegia type [facility])     Problem List Patient Active Problem List   Diagnosis Date Noted  . Goals of care, counseling/discussion 12/16/2018  . Palliative care by specialist   . 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 Spastic hemiparesis ([place])   . Cerebral edema (HCC)   . Intracranial tumor ([place])   . Steroid-induced hyperglycemia   . Spastic hemiplegia affecting nondominant side ([place])   . Hyponatremia   . Transaminitis   .  Leucocytosis   . Right spastic hemiplegia ([place]) 10/21/2018  . Dysphagia 10/21/2018  . 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 Brain mass   . ICH (intracerebral hemorrhage) ([place]) 10/13/2018    Rico Junker, PT, DPT 02/28/19    11:49 AM    [facility] [address] [place], [place], [address] Phone: [phone]   Fax:  [phone]  Name: ALCUS BRADLY MRN: [id number] Date of Birth: January 22, 1995

## 2019-03-02 ENCOUNTER — Ambulatory Visit: Payer: 59

## 2019-03-02 ENCOUNTER — Ambulatory Visit: Payer: 59 | Admitting: Physical Therapy

## 2019-03-02 ENCOUNTER — Ambulatory Visit: Payer: 59 | Admitting: Occupational Therapy

## 2019-03-04 ENCOUNTER — Ambulatory Visit: Payer: 59

## 2019-03-04 ENCOUNTER — Ambulatory Visit: Payer: 59 | Admitting: Occupational Therapy

## 2019-03-04 ENCOUNTER — Ambulatory Visit: Payer: 59 | Admitting: Physical Therapy

## 2019-03-04 ENCOUNTER — Other Ambulatory Visit: Payer: Self-pay

## 2019-03-04 DIAGNOSIS — G8191 Hemiplegia, unspecified affecting right dominant side: Secondary | ICD-10-CM

## 2019-03-04 DIAGNOSIS — R2681 Unsteadiness on feet: Secondary | ICD-10-CM | POA: Diagnosis not present

## 2019-03-04 DIAGNOSIS — R471 Dysarthria and anarthria: Secondary | ICD-10-CM

## 2019-03-04 DIAGNOSIS — M6281 Muscle weakness (generalized): Secondary | ICD-10-CM

## 2019-03-04 DIAGNOSIS — R29818 Other symptoms and signs involving the nervous system: Secondary | ICD-10-CM

## 2019-03-04 DIAGNOSIS — R41841 Cognitive communication deficit: Secondary | ICD-10-CM

## 2019-03-04 DIAGNOSIS — R4701 Aphasia: Secondary | ICD-10-CM

## 2019-03-04 DIAGNOSIS — R41842 Visuospatial deficit: Secondary | ICD-10-CM

## 2019-03-04 DIAGNOSIS — R482 Apraxia: Secondary | ICD-10-CM

## 2019-03-04 NOTE — Therapy (Signed)
[facility] [address] [place], [place], [address] Phone: [phone]   Fax:  [phone]  Speech Language Pathology Treatment  Patient Details  Name: Gregory Moreno MRN: [id number] Date of Birth: 06/15/1995 Referring Provider (SLP): Alger Simons, MD   Encounter Date: 03/04/2019  End of Session - 03/04/19 1638    Visit Number  21    Number of Visits  25    Date for SLP Re-Evaluation  03/17/19    Authorization - Visit Number  12    Authorization - Number of Visits  30    SLP Start Time  1219    SLP Stop Time   1446    SLP Time Calculation (min)  42 min    Activity Tolerance  Patient tolerated treatment well       No past medical history on file.  Past Surgical History:  Procedure Laterality Date  . CRANIOTOMY Left 10/13/2018   Procedure: LEFT CRANIOTOMY FOR TUMOR RESECTION;  Surgeon: Judith Part, MD;  Location: [facility];  Service: Neurosurgery;  Laterality: Left;    There were no vitals filed for this visit.         ADULT SLP TREATMENT - 03/04/19 1408      General Information   Behavior/Cognition  Cooperative;Lethargic;Requires cueing      Treatment Provided   Treatment provided  Cognitive-Linquistic      Cognitive-Linquistic Treatment   Treatment focused on  Aphasia;Dysarthria;Apraxia;Patient/family/caregiver education    Skilled Treatment  SLP and pt worked with Engineer, drilling today with SLP providing consistent written cues for object naming (nouns). 30% success with max cues. When pt answered incorrectly, answers mostly semantic paraphasias. SLP modeled written and verbal cues for father today to assist pt at home - SLP suggested someone sit with pt and complete this task. Pt hit the "answer" button once when experiencing difficulty - SLP told pt not to do this again and pt did not do so.      Assessment / Recommendations / Plan   Plan  Continue with current plan of care      Progression  Toward Goals   Progression toward goals  --   pt a little more success with written cues      SLP Education - 03/04/19 1638    Education Details  how to cue pt with TalkPath    Person(s) Educated  Patient;Parent(s)    Methods  Explanation;Demonstration    Comprehension  Verbalized understanding;Need further instruction       SLP Short Term Goals - 01/30/19 1508      SLP SHORT TERM GOAL #1   Title  pt will complete aphasia assessment of auditory comprehension and verbal expression within 1-2 sessions    Status  Achieved      SLP SHORT TERM GOAL #2   Title  pt will demo understanding of more complex commands (spoken) with 80% and occasional min cues over three sessions    Status  Not Met      SLP SHORT TERM GOAL #3   Title  pt will answer pertinent questions with at least 3 words    Status  Not Met      SLP SHORT TERM GOAL #4   Title  pt will demo sustained/selective attention necessary to complete efficacious speech therapy in 6 of the first 8 sessions    Baseline  12-31-18, 01-13-19    Period  --   or 8 sessions   Status  Partially  Met       SLP Long Term Goals - 03/04/19 1639      SLP LONG TERM GOAL #1   Title  pt will demo understanding of commands (spoken) with 85% and rare min cues over three sessions    Baseline  02-06-19    Time  1    Period  Weeks   or 17 sessions, for all LTGs   Status  On-going      SLP LONG TERM GOAL #2   Title  pt will answer questions with [facility] verbal responses 75% of the time, in 3 sessions    Baseline  01-27-19    Time  1    Period  Weeks    Status  On-going      SLP LONG TERM GOAL #3   Title  pt will sustain loud /a/ with average mid 70s dB over 4 sessions    Time  5    Period  Weeks    Status  Deferred   due to focus on language     SLP LONG TERM GOAL #4   Title  pt will engage in simple conversation for 5 minutes with average loudness mid 60s dB over 3 sessions    Time  5    Period  Weeks    Status  Deferred   due to  focus on language      Plan - 03/04/19 1639    Clinical Impression Statement  Pt with mod Broca's aphasia, dysarthria/voice, and cognitive communication deficits. Pt cont to require consistent SLP cues for verbal communication, pt better with written cues. See "skilled intervention" for more details on today's session. Pt would cont to benefit from skilled ST focusing on primarily receptive and expressive language but also at some point in rehab process focus on speech loudness and cognitive communication skills. Pt cont to require skilled ST focusing primarily on language skills.    Speech Therapy Frequency  2x / week    Duration  --   12 weeks or 25 total visits   Treatment/Interventions  Environmental controls;Functional tasks;Compensatory techniques;Multimodal communcation approach;SLP instruction and feedback;Cueing hierarchy;Language facilitation;Cognitive reorganization;Internal/external aids;Patient/family education    Potential to Achieve Goals  Good    Potential Considerations  Co-morbidities;Ability to learn/carryover information;Severity of impairments;Other (comment)       Patient will benefit from skilled therapeutic intervention in order to improve the following deficits and impairments:   Aphasia  Dysarthria and anarthria  Cognitive communication deficit    Problem List Patient Active Problem List   Diagnosis Date Noted  . Goals of care, counseling/discussion 12/16/2018  . Palliative care by specialist   . 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 Spastic hemiparesis ([place])   . Cerebral edema (HCC)   . Intracranial tumor ([place])   . Steroid-induced hyperglycemia   . Spastic hemiplegia affecting nondominant side ([place])   . Hyponatremia   . Transaminitis   . Leucocytosis   . Right spastic hemiplegia ([place]) 10/21/2018  . Dysphagia 10/21/2018  . 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 Brain mass   . ICH (intracerebral hemorrhage) ([place]) 10/13/2018     [facility] ,[place], [place]  03/04/2019, 4:39 PM  [facility] [address] [place], [place], [address] Phone: [phone]   Fax:  [phone]   Name: REYAN HELLE MRN: [id number] Date of Birth: [birth year]

## 2019-03-04 NOTE — Therapy (Signed)
[facility] [address] [place], [place], [address] Phone: [phone]   Fax:  [phone]  Occupational Therapy Treatment  Patient Details  Name: Gregory Moreno MRN: [id number] Date of Birth: 10-Nov-1995 Referring Provider (OT): Mar[place]e [place]   Encounter Date: 03/04/2019  OT End of Session - 03/04/19 1618    Visit Number  22    Number of Visits  25    Date for OT Re-Evaluation  03/17/19    Authorization Type  30 VL    Authorization - Visit Number  14    Authorization - Number of Visits  30    OT Start Time  [id number]    OT Stop Time  1530    OT Time Calculation (min)  45 min    Activity Tolerance  Patient tolerated treatment well    Behavior During Therapy  [facility] for tasks assessed/performed       No past medical history on file.  Past Surgical History:  Procedure Laterality Date  . CRANIOTOMY Left 10/13/2018   Procedure: LEFT CRANIOTOMY FOR TUMOR RESECTION;  Surgeon: Judith Part, MD;  Location: [facility];  Service: Neurosurgery;  Laterality: Left;    There were no vitals filed for this visit.  Subjective Assessment - 03/04/19 1610    Subjective   Outside - four days  (patientrying to tell me about not having power for last 4 days    Patient is accompanied by:  Family member    Currently in Pain?  No/denies    Pain Score  0-No pain                   OT Treatments/Exercises (OP) - 03/04/19 0001      Neurological Re-education Exercises   Other Exercises 1  Neuromuscular reeducation to activate right limbs into surface.  Followed with sidelying to isolate elbow, forearm, wrist and digit motion.  In sidelying with right hand in direct line of sight after weight bearing, patient able to flex elbow and actively realx flexors to allow arm to fall into extension.  Unable to get active elbow extension in this position today.  Able to pronate/supinate forearm with only guiding assistance, able to flex and  extend wrist, and oppose index, and ring finger to thumb.      Other Exercises 2  In standing worked on dynamic stand conditions to activate right LE while attempting to push and use guided reach with RUE.                 OT Short Term Goals - 01/30/19 1630      OT SHORT TERM GOAL #2   Title  Patient will reach forward with shoulder flexion, elbow extension pattern to make contact with target in midline and 8-12' from chest with intermittent assist    Status  Achieved      OT SHORT TERM GOAL #3   Title  Patient will transfer from wheelchair to commode with no more than supervision assistance - transferring either toward right or toward left side    Status  Achieved      OT SHORT TERM GOAL #4   Title  Patient will grasp and release a cylindrical object 2-3" in diameter with min assist    Status  Achieved      OT SHORT TERM GOAL #5   Title  Patient will dress lower body with mod assist    Status  Achieved  OT Long Term Goals - 02/24/19 1722      OT LONG TERM GOAL #1   Title  Patient will dress himself with no greater than set up assistance    Status  On-going      OT LONG TERM GOAL #2   Title  Patient will shower with supervision    Status  Achieved      OT LONG TERM GOAL #3   Title  Patient will cut food on plate with modified independence    Status  On-going      OT LONG TERM GOAL #4   Title  Patient will demonstrate ability to retrieve a lightweight item (less than 2lb) from waist height or lower and transport 6-12 inches laterally with RUE    Status  On-going      OT LONG TERM GOAL #5   Title  Patient will demonstrate RUE  mid level reach with minimal LUE support to hit a target at chest height directly in front of body    Status  On-going            Plan - 03/04/19 1618    Clinical Impression Statement  Patient with steady improvement with functional mobility, hyper/hypotonicity, apraxia and diminished sensation impede functional use of RUE.     OT Frequency  2x / week    OT Duration  12 weeks    OT Treatment/Interventions  Self-care/ADL training;DME and/or AE instruction;Splinting;Balance training;Aquatic Therapy;Therapeutic activities;Therapeutic exercise;Cognitive remediation/compensation;Neuromuscular education;Functional Mobility Training;Visual/perceptual remediation/compensation;Electrical Stimulation;Manual Therapy;Patient/family education    Plan  supine BUE exercises - estabish for HEP, static to dynamic sitting and standing balance, working toward forward reach    [employer] and Agree with Plan of Care  Patient;Family member/caregiver    Family Member Consulted  DAD       Patient will benefit from skilled therapeutic intervention in order to improve the following deficits and impairments:           Visit Diagnosis: Hemiplegia of right dominant side due to noncerebrovascular etiology, unspecified hemiplegia type ([place])  Muscle weakness (generalized)  Unsteadiness on feet  Apraxia  Visuospatial deficit  Other symptoms and signs involving the nervous system    Problem List Patient Active Problem List   Diagnosis Date Noted  . Goals of care, counseling/discussion 12/16/2018  . Palliative care by specialist   . 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 Spastic hemiparesis ([place])   . Cerebral edema (HCC)   . Intracranial tumor ([place])   . Steroid-induced hyperglycemia   . Spastic hemiplegia affecting nondominant side ([place])   . Hyponatremia   . Transaminitis   . Leucocytosis   . Right spastic hemiplegia ([place]) 10/21/2018  . Dysphagia 10/21/2018  . 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 Brain mass   . ICH (intracerebral hemorrhage) ([place]) 10/13/2018    Mariah Milling, OTR/L 03/04/2019, 4:21 PM  [facility] [address] [place], [place], [address] Phone: [phone]   Fax:  [phone]  Name: Gregory Moreno MRN:  [id number] Date of Birth: [birth year]

## 2019-03-04 NOTE — Progress Notes (Signed)
  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  The patient then received a 14 Gy boost to yield a final dose of 60 Gy.  Narrative: The patient tolerated radiation treatment relatively well.     Plan: The patient has completed radiation treatment. The patient will return to radiation oncology clinic for routine followup in one month. I advised the patient to call or return sooner if they have any questions or concerns related to their recovery or treatment. ________________________________  Jodelle Gross, M.D., Ph.D.

## 2019-03-05 NOTE — Therapy (Signed)
[facility] [address] [place], [place], [address] Phone: [phone]   Fax:  [phone]  Physical Therapy Treatment  Patient Details  Name: Gregory Moreno MRN: [id number] Date of Birth: 24-Nov-1995 Referring Provider (PT): Alger Simons, MD   Encounter Date: 03/04/2019  PT End of Session - 03/05/19 1426    Visit Number  22    Number of Visits  25    Date for PT Re-Evaluation  03/12/19    Authorization Type  [employer] - $25 co pay - collect 1 copay, VL: 30 combined (PT/OT/ST), counts as 1 as done on same day    Authorization - Visit Number  14   count updated for 2021-PT/OT/ST combined   Authorization - Number of Visits  30    PT Start Time  1540    PT Stop Time  1625    PT Time Calculation (min)  45 min    Equipment Utilized During Treatment  Gait belt    Activity Tolerance  Patient tolerated treatment well    Behavior During Therapy  [facility] for tasks assessed/performed       No past medical history on file.  Past Surgical History:  Procedure Laterality Date  . CRANIOTOMY Left 10/13/2018   Procedure: LEFT CRANIOTOMY FOR TUMOR RESECTION;  Surgeon: Judith Part, MD;  Location: [place];  Service: Neurosurgery;  Laterality: Left;    There were no vitals filed for this visit.  Subjective Assessment - 03/04/19 1540    Subjective  was feeling nauseous on Monday from the chemo.    Limitations  Sitting;Walking;Standing    How long can you walk comfortably?  100' with hemiwalker    Patient Stated Goals  wants to improve his walking, get back to normal    Currently in Pain?  No/denies                       [facility] Adult PT Treatment/Exercise - 03/05/19 0001      Transfers   Transfers  Sit to Stand;Stand to Sit    Sit to Stand  5: Supervision;4: Min guard    Sit to Stand Details  Verbal cues for sequencing;Verbal cues for technique    Sit to Stand Details (indicate cue type and reason)  cues for  proper foot placement     Stand to Sit  5: Supervision    Stand to Sit Details (indicate cue type and reason)  Tactile cues for weight shifting;Tactile cues for initiation;Visual cues for safe use of DME/AE;Tactile cues for weight beaing    Squat Pivot Transfers  6: Modified independent (Device/Increase time)    Squat Pivot Transfer Details (indicate cue type and reason)  from w/c <> mat table    Comments  1 x 10 reps sit <> stands from mat table with LLE placed on 4" step for increased weight shifting and muscle activation through RLE, no UE support when descending back down to mat - cues for eccentric control and incr hip flexion and manual cues to maintain weight shift through RLE       Ambulation/Gait   Ambulation/Gait  Yes    Ambulation/Gait Assistance  4: Min assist    Ambulation/Gait Assistance Details  Use of green theraband around RW posteriorly at pt's glutes to help assist with upright posture and staying inside RW , pt demonstrating less R genu recurvatum and more fluid gait mechanics. Still needs verbal and demonstrative cues for wider step length,  manual assist provided at pt's pelvis to assist with weight shifting to clear BLEs    Ambulation Distance (Feet)  115 Feet    Assistive device  Rolling walker;Other (Comment)   R AFO   Gait Pattern  Decreased stance time - right;Decreased step length - left;Decreased weight shift to right;Decreased hip/knee flexion - right;Right genu recurvatum;Trunk flexed;Narrow base of support;Poor foot clearance - right;Step-through pattern;Decreased dorsiflexion - right    Ambulation Surface  Level;Indoor      Neuro Re-ed    Neuro Re-ed Details   Seated on large rockerboard on mat table with mirror for visual feedback -BLE placed on step, 1 x 10 reps weight shifting R and L - with holds towards R, min A and verbal/tactile cues for weight shift. Pt initially fearful shifting towards R and grabbing out with LUE towards rockerboard for balance. Cued to  lift LLE off step for increased weight shift and activation through RLE.           Balance Exercises - 03/05/19 1437      Balance Exercises: Standing   Rockerboard  Lateral;EO;UE support;Limitations;10 reps    Rockerboard Limitations  min A from therapist for weight shift, therapist helping to block RLE from R genu recurvatum when shifting towards R          PT Short Term Goals - 02/18/19 1128      PT SHORT TERM GOAL #1   Title  Patient and pt's family will be independent with ongoing HEP designed to improve movement and strength in RLE.  ALL STGS DUE 02/11/19    Baseline  reports performing initial HEP    Time  4    Period  Weeks    Status  Achieved    Target Date  02/11/19      PT SHORT TERM GOAL #2   Title  Patient will stand at countertop for at least 8 minutes with supervision to increase tolerance for ADLs.    Baseline  not assessed at countertop, pt performing standing activities at edge of mat and standing in // bars in previous sessions >5 minutes    Period  Weeks    Status  Partially Met      PT SHORT TERM GOAL #3   Title  Pt will perform stand pivot vs. squat pivot transfer to either R or L from wc to mat table with mod I  in order to decrease caregiver burden.    Baseline  supervision for squat pivot transfer from w/c > mat table    Time  4    Period  Weeks    Status  Partially Met      PT SHORT TERM GOAL #4   Title  Pt will perform all bed mobility with supervision in order to decrease caregiver burden.    Baseline  continues to need min A for supine > sit, especially when having to roll towards pt's R    Time  4    Period  Weeks    Status  Not Met      PT SHORT TERM GOAL #5   Title  Pt will ambulate at least 115' with RW, R AFO and min guard in order to improve household mobility.    Baseline  115' with RW, R AFO and min A    Time  4    Period  Weeks    Status  Not Met        PT Long Term Goals - 02/18/19  [place] #1   Title   Patient and pt's family will be independent with final HEP designed to improve movement and strength in RLE.  ALL LTGS DUE 03/12/19    Time  12    Period  Weeks    Status  On-going      PT LONG TERM GOAL #2   Title  Pt will ambulate at least 95' with RW and supervision in order to improve household mobility.    Baseline  115' with RW and min guard/min A with R AFO    Time  12    Period  Weeks    Status  New      PT LONG TERM GOAL #3   Title  Pt will perform at least 10 sit <> stands from mat table with proper technique with no cueing and mod I in order to improve functional transfers and demo improved LE strength.    Time  12    Period  Weeks    Status  On-going      PT LONG TERM GOAL #4   Title  Pt will perform all bed mobility from level mat table with mod I in order to decrease caregiver burden.    Time  12    Period  Weeks    Status  On-going      PT LONG TERM GOAL #5   Title  Patient will perform at least 2 steps using single handrail and step to pattern with min A.    Time  12    Period  Weeks    Status  New      Additional Long Term Goals   Additional Long Term Goals  --      PT LONG TERM GOAL #6   Title  --    Time  --    Period  --    Status  --            Plan - 03/05/19 1429    Clinical Impression Statement  Today's skilled session continued to focus on NMR for weight shifting to the R and gait training with RW. Use of mirror for visual feedback for weight shifting in sitting and standing on rocker board - pt still fearful with weight shifting towards R, as when seated on rocker board pt with tendency to reach out with L hand to hold on for balance. Pt with improvement in gait mechanics today with green theraband placed posteriorly around RW to help facilitate upright posture and keep RW close - provided to pt for home use. Will continue to progress towards LTGs.    Personal Factors and Comorbidities  --   pt ungergoing chemo/radiation    Examination-Activity Limitations  Bed Mobility;Locomotion Level;Sit;Squat;Stairs;Stand;Transfers    Examination-Participation Restrictions  Community Activity;School;Driving    Stability/Clinical Decision Making  Evolving/Moderate complexity    Rehab Potential  Good    PT Frequency  2x / week    PT Duration  12 weeks    PT Treatment/Interventions  ADLs/Self Care Home Management;Therapeutic exercise;Therapeutic activities;Functional mobility training;Stair training;Gait training;DME Instruction;Balance training;Neuromuscular re-education;Patient/family education;Orthotic Fit/Training;Wheelchair mobility training;Energy conservation;Passive range of motion    PT Next Visit Plan  walking from waiting room/ST session.Weight shifting training - perched sitting on wedge or bolster edge of mat for increased LE extension activation, weight shift R - lift LLE, rockerboard -seated and standing for postural control/trunk righting.  gait training with RW and R hand orthosis  progressing distance and quality of gait/no AD for smaller distances, NMR for R weight shifting, step taps with LLE, bed mobility/core and trunk strengthening. pt with tendency to have narrow BOS while ambulating - strategies to incr step width/hip ADD and any hip ABD strengthening    PT Home Exercise Plan  [id number]    Consulted and Agree with Plan of Care  Patient;Family member/caregiver    Family Member Consulted  dad, [place]       Patient will benefit from skilled therapeutic intervention in order to improve the following deficits and impairments:  Abnormal gait, Decreased activity tolerance, Decreased balance, Decreased cognition, Decreased coordination, Decreased safety awareness, Decreased range of motion, Decreased mobility, Difficulty walking, Decreased strength, Decreased endurance, Impaired tone  Visit Diagnosis: 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 Goals of care, counseling/discussion 12/16/2018  . Palliative care by specialist   . 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 Spastic hemiparesis ([place])   . Cerebral edema (HCC)   . Intracranial tumor ([place])   . Steroid-induced hyperglycemia   . Spastic hemiplegia affecting nondominant side ([place])   . Hyponatremia   . Transaminitis   . Leucocytosis   . Right spastic hemiplegia ([place]) 10/21/2018  . Dysphagia 10/21/2018  . 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 Brain mass   . ICH (intracerebral hemorrhage) ([place]) 10/13/2018    Arliss Journey, PT, DPT  03/05/2019, 2:40 PM  [facility] [address] [place], [place], [address] Phone: [phone]   Fax:  [phone]  Name: Gregory Moreno MRN: [id number] Date of Birth: 09-22-1995

## 2019-03-09 ENCOUNTER — Ambulatory Visit: Payer: 59 | Admitting: Occupational Therapy

## 2019-03-09 ENCOUNTER — Encounter: Payer: Self-pay | Admitting: Occupational Therapy

## 2019-03-09 ENCOUNTER — Ambulatory Visit: Payer: 59 | Admitting: Physical Therapy

## 2019-03-09 ENCOUNTER — Other Ambulatory Visit: Payer: Self-pay

## 2019-03-09 ENCOUNTER — Ambulatory Visit: Payer: 59

## 2019-03-09 DIAGNOSIS — R41841 Cognitive communication deficit: Secondary | ICD-10-CM

## 2019-03-09 DIAGNOSIS — M6281 Muscle weakness (generalized): Secondary | ICD-10-CM

## 2019-03-09 DIAGNOSIS — R482 Apraxia: Secondary | ICD-10-CM

## 2019-03-09 DIAGNOSIS — R2681 Unsteadiness on feet: Secondary | ICD-10-CM

## 2019-03-09 DIAGNOSIS — G8191 Hemiplegia, unspecified affecting right dominant side: Secondary | ICD-10-CM

## 2019-03-09 DIAGNOSIS — R41842 Visuospatial deficit: Secondary | ICD-10-CM

## 2019-03-09 DIAGNOSIS — R29818 Other symptoms and signs involving the nervous system: Secondary | ICD-10-CM

## 2019-03-09 DIAGNOSIS — R4701 Aphasia: Secondary | ICD-10-CM

## 2019-03-09 DIAGNOSIS — R471 Dysarthria and anarthria: Secondary | ICD-10-CM

## 2019-03-09 NOTE — Therapy (Signed)
[facility] [address] [place], [place], [address] Phone: [phone]   Fax:  [phone]  Occupational Therapy Treatment  Patient Details  Name: Gregory Moreno MRN: [id number] Date of Birth: Dec 16, 1995 Referring Provider (OT): [place]   Encounter Date: 03/09/2019  OT End of Session - 03/09/19 1630    Visit Number  23    Number of Visits  25    Date for OT Re-Evaluation  03/17/19    Authorization Type  30 VL    Authorization - Visit Number  15    Authorization - Number of Visits  30    OT Start Time  [id number]    OT Stop Time  1620    OT Time Calculation (min)  40 min    Activity Tolerance  Patient tolerated treatment well    Behavior During Therapy  [facility] for tasks assessed/performed       History reviewed. No pertinent past medical history.  Past Surgical History:  Procedure Laterality Date  . CRANIOTOMY Left 10/13/2018   Procedure: LEFT CRANIOTOMY FOR TUMOR RESECTION;  Surgeon: Judith Part, MD;  Location: [place];  Service: Neurosurgery;  Laterality: Left;    There were no vitals filed for this visit.  Subjective Assessment - 03/09/19 1537    Subjective   Good    Patient is accompanied by:  Family member    Currently in Pain?  No/denies    Pain Score  0-No pain                   OT Treatments/Exercises (OP) - 03/09/19 0001      ADLs   ADL Comments  Had long discussion with patient and his father regarding reaching the end of this plan of care, and wanting to ensure that visits approved for later aspect of year, as patient still undergoing treatment for brain tumor.        Neurological Re-education Exercises   Other Exercises 1  Discussed the benfit of continued attempts at functional use of RUE and continued emphasis on reducing caregiver support for basic self care activities - even if it means some compensatory strategies .      Other Exercises 2  Long arm weight bearing through  heel of hand in standing - modified plantigrade - to three point.  Forced use of R limbs to aide with more sustained activation.  Patient needing cueing/facilitation for postural control / activation during this task.               OT Education - 03/09/19 1630    Education Details  reviewed benefit of wearing night splint to reduce hand tension in am    Person(s) Educated  Patient;Parent(s)    Methods  Explanation    Comprehension  Verbalized understanding       OT Short Term Goals - 01/30/19 1630      OT SHORT TERM GOAL #2   Title  Patient will reach forward with shoulder flexion, elbow extension pattern to make contact with target in midline and 8-12' from chest with intermittent assist    Status  Achieved      OT SHORT TERM GOAL #3   Title  Patient will transfer from wheelchair to commode with no more than supervision assistance - transferring either toward right or toward left side    Status  Achieved      OT SHORT TERM GOAL #4   Title  Patient will grasp and  release a cylindrical object 2-3" in diameter with min assist    Status  Achieved      OT SHORT TERM GOAL #5   Title  Patient will dress lower body with mod assist    Status  Achieved        OT Long Term Goals - 02/24/19 1722      OT LONG TERM GOAL #1   Title  Patient will dress himself with no greater than set up assistance    Status  On-going      OT LONG TERM GOAL #2   Title  Patient will shower with supervision    Status  Achieved      OT LONG TERM GOAL #3   Title  Patient will cut food on plate with modified independence    Status  On-going      OT LONG TERM GOAL #4   Title  Patient will demonstrate ability to retrieve a lightweight item (less than 2lb) from waist height or lower and transport 6-12 inches laterally with RUE    Status  On-going      OT LONG TERM GOAL #5   Title  Patient will demonstrate RUE  mid level reach with minimal LUE support to hit a target at chest height directly in  front of body    Status  On-going            Plan - 03/09/19 1631    Clinical Impression Statement  Plan to discharge at end of plan of care - 2 more visits to allow patient a break from rehab process, and to reserve visits for remainder of year.  Parents assessing whether add'l therapy visits are possible.    OT Frequency  2x / week    OT Duration  12 weeks    OT Treatment/Interventions  Self-care/ADL training;DME and/or AE instruction;Splinting;Balance training;Aquatic Therapy;Therapeutic activities;Therapeutic exercise;Cognitive remediation/compensation;Neuromuscular education;Functional Mobility Training;Visual/perceptual remediation/compensation;Electrical Stimulation;Manual Therapy;Patient/family education    Plan  supine BUE exercises - estabish for HEP, static to dynamic sitting and standing balance, working toward forward reach    [employer] and Agree with Plan of Care  Patient;Family member/caregiver    Family Member Consulted  DAD       Patient will benefit from skilled therapeutic intervention in order to improve the following deficits and impairments:           Visit Diagnosis: Apraxia  Visuospatial deficit  Other symptoms and signs involving the nervous system  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 Goals of care, counseling/discussion 12/16/2018  . Palliative care by specialist   . 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 Spastic hemiparesis ([place])   . Cerebral edema (HCC)   . Intracranial tumor ([place])   . Steroid-induced hyperglycemia   . Spastic hemiplegia affecting nondominant side ([place])   . Hyponatremia   . Transaminitis   . Leucocytosis   . Right spastic hemiplegia ([place]) 10/21/2018  . Dysphagia 10/21/2018  . 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 Brain mass   . ICH (intracerebral hemorrhage) ([place]) 10/13/2018    Mariah Milling, OTR/L 03/09/2019, 4:33 PM  [facility] [address] [place], [place], [address] Phone: [phone]   Fax:  [phone]  Name: Gregory Moreno MRN: [id number] Date of Birth:  11/23/1995 

## 2019-03-09 NOTE — Therapy (Signed)
[facility] [address] [place], [place], [address] Phone: [phone]   Fax:  [phone]  Speech Language Pathology Treatment  Patient Details  Name: Gregory Moreno MRN: [id number] Date of Birth: 12/30/1995 Referring Provider (SLP): Alger Simons, MD   Encounter Date: 03/09/2019  End of Session - 03/09/19 1625    Visit Number  22    Number of Visits  25    Date for SLP Re-Evaluation  03/17/19    Authorization - Visit Number  13    Authorization - Number of Visits  30    SLP Start Time  7262    SLP Stop Time   0355    SLP Time Calculation (min)  41 min    Activity Tolerance  Patient tolerated treatment well       History reviewed. No pertinent past medical history.  Past Surgical History:  Procedure Laterality Date  . CRANIOTOMY Left 10/13/2018   Procedure: LEFT CRANIOTOMY FOR TUMOR RESECTION;  Surgeon: Judith Part, MD;  Location: [facility];  Service: Neurosurgery;  Laterality: Left;    There were no vitals filed for this visit.         ADULT SLP TREATMENT - 03/09/19 1506      General Information   Behavior/Cognition  Cooperative;Requires cueing;Alert;Decreased sustained attention   flat affect     Treatment Provided   Treatment provided  Cognitive-Linquistic      Cognitive-Linquistic Treatment   Treatment focused on  Aphasia;Cognition;Patient/family/caregiver education    Skilled Treatment  SLP worked with pt on salient and functional language - pt and father [place] [employer] 2/4 cars they store there. Pt req'd wirtten cues consstently and then pt was ~25% successful with generating necessary word. Pt req'd usual written cues, phonemic cues, as he was struggling with anomia, however at times pt reported he did not recall (memory). Semantic cues were almost always not helpful for pt. Semantic errors and gibberish and perseveration on prior answers were predominant when pt  generated incorrect verbal responses. OVerall, approx 50% awareness of verbal errors. Once, pt stated, "This sucks" when he oculd not think of "[employer]". SLP highlihghted that verbal tasks like this are excellent to do at home as pt is highly interested in them, and are highly salient, as pt would have discussed these things with father/friends/family premorbidly.      Assessment / Recommendations / Plan   Plan  Continue with current plan of care      Progression Toward Goals   Progression toward goals  Not progressing toward goals (comment)   severity of deficit; pt more affective today than last week      SLP Education - 03/09/19 1625    Education Details  home tasks which are salient and interesting for pt    Person(s) Educated  Patient;Parent(s)    Methods  Explanation    Comprehension  Verbalized understanding       SLP Short Term Goals - 01/30/19 1508      SLP SHORT TERM GOAL #1   Title  pt will complete aphasia assessment of auditory comprehension and verbal expression within 1-2 sessions    Status  Achieved      SLP SHORT TERM GOAL #2   Title  pt will demo understanding of more complex commands (spoken) with 80% and occasional min cues over three sessions    Status  Not Met      SLP SHORT TERM GOAL #3  Title  pt will answer pertinent questions with at least 3 words    Status  Not Met      SLP SHORT TERM GOAL #4   Title  pt will demo sustained/selective attention necessary to complete efficacious speech therapy in 6 of the first 8 sessions    Baseline  12-31-18, 01-13-19    Period  --   or 8 sessions   Status  Partially Met       SLP Long Term Goals - 03/09/19 1627      SLP LONG TERM GOAL #1   Title  pt will demo understanding of commands (spoken) with 85% and rare min cues over three sessions    Baseline  02-06-19    Time  1    Period  Weeks   or 17 sessions, for all LTGs   Status  On-going      SLP LONG TERM GOAL #2   Title  pt will answer questions  with [facility] verbal responses 75% of the time, in 3 sessions    Baseline  01-27-19    Time  1    Period  Weeks    Status  On-going      SLP LONG TERM GOAL #3   Title  pt will sustain loud /a/ with average mid 70s dB over 4 sessions    Time  5    Period  Weeks    Status  Deferred   due to focus on language     SLP LONG TERM GOAL #4   Title  pt will engage in simple conversation for 5 minutes with average loudness mid 60s dB over 3 sessions    Time  5    Period  Weeks    Status  Deferred   due to focus on language      Plan - 03/09/19 1626    Clinical Impression Statement  Pt with mod Broca's aphasia, dysarthria/voice, and cognitive communication deficits. Pt cont to require consistent SLP cues for verbal communication, pt better with written cues. See "skilled intervention" for more details on today's session. Pt would cont to benefit from skilled ST focusing on primarily receptive and expressive language but also at some point in rehab process focus on speech loudness and cognitive communication skills. Pt cont to require skilled ST focusing primarily on language skills. SLP thinks pt may benefit from a therapy hiatus with ST following this current plan of care, or at a time convenient for pt to take a break prior to the end of this plan of care.    Speech Therapy Frequency  2x / week    Duration  --   12 weeks or 25 total visits   Treatment/Interventions  Environmental controls;Functional tasks;Compensatory techniques;Multimodal communcation approach;SLP instruction and feedback;Cueing hierarchy;Language facilitation;Cognitive reorganization;Internal/external aids;Patient/family education    Potential to Achieve Goals  Good    Potential Considerations  Co-morbidities;Ability to learn/carryover information;Severity of impairments;Other (comment)       Patient will benefit from skilled therapeutic intervention in order to improve the following deficits and impairments:    Aphasia  Dysarthria and anarthria  Cognitive communication deficit    Problem List Patient Active Problem List   Diagnosis Date Noted  . Goals of care, counseling/discussion 12/16/2018  . Palliative care by specialist   . 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 Spastic hemiparesis ([place])   . Cerebral edema (HCC)   . Intracranial tumor ([place])   . Steroid-induced hyperglycemia   .  Spastic hemiplegia affecting nondominant side ([place])   . Hyponatremia   . Transaminitis   . Leucocytosis   . Right spastic hemiplegia ([place]) 10/21/2018  . Dysphagia 10/21/2018  . 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 Brain mass   . ICH (intracerebral hemorrhage) ([place]) 10/13/2018    [facility] ,[place], [place]  03/09/2019, 4:29 PM  [facility] [address] [place], [place], [address] Phone: [phone]   Fax:  [phone]   Name: Gregory Moreno MRN: [id number] Date of Birth: 1995/05/30

## 2019-03-12 NOTE — Therapy (Signed)
[facility] [address] [place], [place], [address] Phone: [phone]   Fax:  [phone]  Physical Therapy Treatment  Patient Details  Name: Gregory Moreno MRN: [id number] Date of Birth: 1995-08-29 Referring Provider (PT): Alger Simons, MD   Encounter Date: 03/09/2019  PT End of Session - 03/12/19 2134    Visit Number  23    Number of Visits  25    Date for PT Re-Evaluation  03/12/19    Authorization Type  [employer] - $25 co pay - collect 1 copay, VL: 30 combined (PT/OT/ST), counts as 1 as done on same day    Authorization - Visit Number  15   count updated for 2021-PT/OT/ST combined   Authorization - Number of Visits  30    PT Start Time  1450    PT Stop Time  1532    PT Time Calculation (min)  42 min    Equipment Utilized During Treatment  Gait belt    Activity Tolerance  Patient tolerated treatment well    Behavior During Therapy  [facility] for tasks assessed/performed       No past medical history on file.  Past Surgical History:  Procedure Laterality Date  . CRANIOTOMY Left 10/13/2018   Procedure: LEFT CRANIOTOMY FOR TUMOR RESECTION;  Surgeon: Judith Part, MD;  Location: [facility];  Service: Neurosurgery;  Laterality: Left;    There were no vitals filed for this visit.                    [facility] Adult PT Treatment/Exercise - 03/13/19 0001      Transfers   Transfers  Sit to Stand;Stand to Sit    Sit to Stand  5: Supervision    Sit to Stand Details  Verbal cues for sequencing;Verbal cues for technique    Sit to Stand Details (indicate cue type and reason)  from mat table, verbal cues for proper foot placement    Stand to Sit  5: Supervision      Ambulation/Gait   Ambulation/Gait  Yes    Ambulation/Gait Assistance  4: Min guard;4: Min assist    Ambulation/Gait Assistance Details  ambulated into therapy gym from speech therapy session - min A behind R knee to prevent genu recurvatum at  times as well as at pelvis for weight shift, cues to stand tall and slow down gait, pt with difficulty clearing RLE due to weakness and demonstrating R ankle rotation. After 1st bout of gait trialed a dance sock on pt's R shoe near the toe box, with pt able to demonstrate improved foot clearance and weight shift without rotating ankle. At 2nd bout of gait at end of session - pt needing to use restroom, OT anteriorly to help facilitate upright posture from trunk and PT posteriorly to assist with weight shift    Ambulation Distance (Feet)  120 Feet   x2, 50'    Assistive device  Rolling walker;Other (Comment)   R AFO   Gait Pattern  Decreased stance time - right;Decreased step length - left;Decreased weight shift to right;Decreased hip/knee flexion - right;Right genu recurvatum;Trunk flexed;Narrow base of support;Poor foot clearance - right;Step-through pattern;Decreased dorsiflexion - right    Ambulation Surface  Level;Indoor      Neuro Re-ed    Neuro Re-ed Details   Standing at edge of mat table with mirror anteriorly for visual feedback and BUE support on RW: staggered stance weight shifting with LLE anteriorly and RLE posteriorly,  multiple reps, with therapist assisting at pt's R foot/ankle to keep in neutral and to help flex R knee for terminal stance/initial swing without rotation of R ankle.       Exercises   Exercises  Other Exercises    Other Exercises   supine on mat table without AFO donned for additional DF ROM throughout activity: single leg bridge with LLE extended and RLE flexed - therapist providing assist to help maintain RLE in position and cues for technique 2 x 10 reps, 1 x 10 reps with RLE on red physioball - knee flexion hamstring curls with pt able to initiate gently and slowly kicking the ball out, but needed therapist to assist bringing ball back in with knee flexion, therapist providing steady pressure through pt's R ankle to prevent clonus when performing. After 10 reps, pt  stating something felt weird and was unable to explain what it was - did state that he was not in pain though.                PT Short Term Goals - 02/18/19 1128      PT SHORT TERM GOAL #1   Title  Patient and pt's family will be independent with ongoing HEP designed to improve movement and strength in RLE.  ALL STGS DUE 02/11/19    Baseline  reports performing initial HEP    Time  4    Period  Weeks    Status  Achieved    Target Date  02/11/19      PT SHORT TERM GOAL #2   Title  Patient will stand at countertop for at least 8 minutes with supervision to increase tolerance for ADLs.    Baseline  not assessed at countertop, pt performing standing activities at edge of mat and standing in // bars in previous sessions >5 minutes    Period  Weeks    Status  Partially Met      PT SHORT TERM GOAL #3   Title  Pt will perform stand pivot vs. squat pivot transfer to either R or L from wc to mat table with mod I  in order to decrease caregiver burden.    Baseline  supervision for squat pivot transfer from w/c > mat table    Time  4    Period  Weeks    Status  Partially Met      PT SHORT TERM GOAL #4   Title  Pt will perform all bed mobility with supervision in order to decrease caregiver burden.    Baseline  continues to need min A for supine > sit, especially when having to roll towards pt's R    Time  4    Period  Weeks    Status  Not Met      PT SHORT TERM GOAL #5   Title  Pt will ambulate at least 115' with RW, R AFO and min guard in order to improve household mobility.    Baseline  115' with RW, R AFO and min A    Time  4    Period  Weeks    Status  Not Met        PT Long Term Goals - 02/18/19 1130      PT LONG TERM GOAL #1   Title  Patient and pt's family will be independent with final HEP designed to improve movement and strength in RLE.  ALL LTGS DUE 03/12/19    Time  12  Period  Weeks    Status  On-going      PT LONG TERM GOAL #2   Title  Pt will ambulate  at least 69' with RW and supervision in order to improve household mobility.    Baseline  115' with RW and min guard/min A with R AFO    Time  12    Period  Weeks    Status  New      PT LONG TERM GOAL #3   Title  Pt will perform at least 10 sit <> stands from mat table with proper technique with no cueing and mod I in order to improve functional transfers and demo improved LE strength.    Time  12    Period  Weeks    Status  On-going      PT LONG TERM GOAL #4   Title  Pt will perform all bed mobility from level mat table with mod I in order to decrease caregiver burden.    Time  12    Period  Weeks    Status  On-going      PT LONG TERM GOAL #5   Title  Patient will perform at least 2 steps using single handrail and step to pattern with min A.    Time  12    Period  Weeks    Status  New      Additional Long Term Goals   Additional Long Term Goals  --      PT LONG TERM GOAL #6   Title  --    Time  --    Period  --    Status  --            Plan - 03/12/19 2136    Clinical Impression Statement  Today's skilled session focused on gait training, LE strengthening, and weight shifting in standing to assist with terminal stance in gait to decr R ankle rotation. Pt demonstrating decr foot clearance of RLE and incr ankle rotation during terminal stance - applied sock to pt's shoe to assist with foot clearance, with pt able to demo improved foot clearance, but still had rotation of R ankle when not fully shifting to LLE. Pt able to incr endurance with gait - able to ambulate back into clinic from speech therapy session and at end of session to use the restroom. Will continue to progress towards LTGs.    Personal Factors and Comorbidities  --   pt ungergoing chemo/radiation   Examination-Activity Limitations  Bed Mobility;Locomotion Level;Sit;Squat;Stairs;Stand;Transfers    Examination-Participation Restrictions  Community Activity;School;Driving    Stability/Clinical Decision  Making  Evolving/Moderate complexity    Rehab Potential  Good    PT Frequency  2x / week    PT Duration  12 weeks    PT Treatment/Interventions  ADLs/Self Care Home Management;Therapeutic exercise;Therapeutic activities;Functional mobility training;Stair training;Gait training;DME Instruction;Balance training;Neuromuscular re-education;Patient/family education;Orthotic Fit/Training;Wheelchair mobility training;Energy conservation;Passive range of motion    PT Next Visit Plan  walking from waiting room/ST session.Weight shifting training - perched sitting on wedge or bolster edge of mat for increased LE extension activation, weight shift R - lift LLE, rockerboard -seated and standing for postural control/trunk righting.  gait training with RW and R hand orthosis progressing distance and quality of gait/no AD for smaller distances, NMR for R weight shifting, step taps with LLE, bed mobility/core and trunk strengthening. pt with tendency to have narrow BOS while ambulating - strategies to incr step width/hip ADD and any  hip ABD strengthening    PT Home Exercise Plan  [id number]    Consulted and Agree with Plan of Care  Patient;Family member/caregiver    Family Member Consulted  dad, Dixon       Patient will benefit from skilled therapeutic intervention in order to improve the following deficits and impairments:  Abnormal gait, Decreased activity tolerance, Decreased balance, Decreased cognition, Decreased coordination, Decreased safety awareness, Decreased range of motion, Decreased mobility, Difficulty walking, Decreased strength, Decreased endurance, Impaired tone  Visit Diagnosis: Other symptoms and signs involving the nervous system  Hemiplegia of right dominant side due to noncerebrovascular etiology, unspecified hemiplegia type (HCC)  Muscle weakness (generalized)  Unsteadiness on feet  Apraxia     Problem List Patient Active Problem List   Diagnosis Date Noted  . Goals of care,  counseling/discussion 12/16/2018  . Palliative care by specialist   . 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 Spastic hemiparesis ([place])   . Cerebral edema (HCC)   . Intracranial tumor ([place])   . Steroid-induced hyperglycemia   . Spastic hemiplegia affecting nondominant side ([facility])   . Hyponatremia   . Transaminitis   . Leucocytosis   . Right spastic hemiplegia ([place]) 10/21/2018  . Dysphagia 10/21/2018  . 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 Brain mass   . ICH (intracerebral hemorrhage) ([facility]) 10/13/2018    Arliss Journey, PT, DPT  03/13/2019, 7:34 AM  [facility] [address] [place], [place], [address] Phone: [phone]   Fax:  [phone]  Name: JASHON ISHIDA MRN: [id number] Date of Birth: January 03, 1996

## 2019-03-13 ENCOUNTER — Ambulatory Visit: Payer: 59 | Admitting: Occupational Therapy

## 2019-03-13 ENCOUNTER — Other Ambulatory Visit: Payer: Self-pay

## 2019-03-13 ENCOUNTER — Encounter: Payer: Self-pay | Admitting: Occupational Therapy

## 2019-03-13 ENCOUNTER — Ambulatory Visit: Payer: 59 | Admitting: Physical Therapy

## 2019-03-13 ENCOUNTER — Ambulatory Visit: Payer: 59

## 2019-03-13 DIAGNOSIS — R2681 Unsteadiness on feet: Secondary | ICD-10-CM | POA: Diagnosis not present

## 2019-03-13 DIAGNOSIS — G8191 Hemiplegia, unspecified affecting right dominant side: Secondary | ICD-10-CM

## 2019-03-13 DIAGNOSIS — R29818 Other symptoms and signs involving the nervous system: Secondary | ICD-10-CM

## 2019-03-13 DIAGNOSIS — M6281 Muscle weakness (generalized): Secondary | ICD-10-CM

## 2019-03-13 DIAGNOSIS — R41842 Visuospatial deficit: Secondary | ICD-10-CM

## 2019-03-13 DIAGNOSIS — R4701 Aphasia: Secondary | ICD-10-CM

## 2019-03-13 DIAGNOSIS — R482 Apraxia: Secondary | ICD-10-CM

## 2019-03-13 DIAGNOSIS — R2689 Other abnormalities of gait and mobility: Secondary | ICD-10-CM

## 2019-03-13 DIAGNOSIS — R41841 Cognitive communication deficit: Secondary | ICD-10-CM

## 2019-03-13 DIAGNOSIS — R471 Dysarthria and anarthria: Secondary | ICD-10-CM

## 2019-03-13 NOTE — Therapy (Signed)
[facility] [address] [place], [place], [address] Phone: [phone]   Fax:  [phone]  Speech Language Pathology Treatment  Patient Details  Name: Gregory Moreno MRN: [id number] Date of Birth: 03-06-1995 Referring Provider (SLP): Alger Simons, MD   Encounter Date: 03/13/2019  End of Session - 03/13/19 1640    Visit Number  23    Number of Visits  25    Date for SLP Re-Evaluation  03/17/19    Authorization - Visit Number  14    Authorization - Number of Visits  30    SLP Start Time  0569    SLP Stop Time   7948    SLP Time Calculation (min)  42 min    Activity Tolerance  Patient tolerated treatment well       History reviewed. No pertinent past medical history.  Past Surgical History:  Procedure Laterality Date  . CRANIOTOMY Left 10/13/2018   Procedure: LEFT CRANIOTOMY FOR TUMOR RESECTION;  Surgeon: Judith Part, MD;  Location: [facility];  Service: Neurosurgery;  Laterality: Left;    There were no vitals filed for this visit.  Subjective Assessment - 03/13/19 1626    Subjective  "Its lotious." (no awareness - "lotion")    Patient is accompained by:  Family member   father   Currently in Pain?  No/denies            ADULT SLP TREATMENT - 03/13/19 1627      General Information   Behavior/Cognition  Cooperative;Requires cueing;Alert;Decreased sustained attention      Treatment Provided   Treatment provided  Cognitive-Linquistic      Cognitive-Linquistic Treatment   Treatment focused on  Aphasia;Cognition;Patient/family/caregiver education    Skilled Treatment  If insurance visits are unlimited - SLP suggests 2 more weeks ST for hopes of notable improvement with correct effective verbal communication and then 4 more weeks if we see this. If not, then ST hold. SLP linked [place] with pt today - provided noun naming and other pertinent tasks to pt's program. Father cued pt  appropriately however could benefit from cont'd SLP assistance for even more effective cues. Success today 30% incr'd to 50% with max cues. SLP heard pt use language typical of frustration when anomia evident ("F**k."), absent of emotional intonation.      Assessment / Recommendations / Plan   Plan  Continue with current plan of care   confirm insurance information next week     Progression Toward Goals   Progression toward goals  Not progressing toward goals (comment)   severity of deficits, pt again more affective today      SLP Education - 03/13/19 1640    Education Details  cueing pt    Person(s) Educated  Patient;Parent(s)    Methods  Explanation;Demonstration    Comprehension  Verbalized understanding;Returned demonstration       SLP Short Term Goals - 01/30/19 1508      SLP SHORT TERM GOAL #1   Title  pt will complete aphasia assessment of auditory comprehension and verbal expression within 1-2 sessions    Status  Achieved      SLP SHORT TERM GOAL #2   Title  pt will demo understanding of more complex commands (spoken) with 80% and occasional min cues over three sessions    Status  Not Met      SLP SHORT TERM GOAL #3   Title  pt will answer pertinent questions with at least  3 words    Status  Not Met      SLP SHORT TERM GOAL #4   Title  pt will demo sustained/selective attention necessary to complete efficacious speech therapy in 6 of the first 8 sessions    Baseline  12-31-18, 01-13-19    Period  --   or 8 sessions   Status  Partially Met       SLP Long Term Goals - 03/13/19 1641      SLP LONG TERM GOAL #1   Title  pt will demo understanding of commands (spoken) with 85% and rare min cues over three sessions    Baseline  02-06-19    Time  1    Period  Weeks   or 17 sessions, for all LTGs   Status  On-going      SLP LONG TERM GOAL #2   Title  pt will answer questions with [facility] verbal responses 75% of the time, in 3 sessions    Baseline  01-27-19    Time  1     Period  Weeks    Status  On-going      SLP LONG TERM GOAL #3   Title  pt will sustain loud /a/ with average mid 70s dB over 4 sessions    Time  5    Period  Weeks    Status  Deferred   due to focus on language     SLP LONG TERM GOAL #4   Title  pt will engage in simple conversation for 5 minutes with average loudness mid 60s dB over 3 sessions    Time  5    Period  Weeks    Status  Deferred   due to focus on language      Plan - 03/13/19 1640    Clinical Impression Statement  Pt with mod Broca's aphasia, dysarthria/voice, and cognitive communication deficits. Pt cont to require consistent SLP cues for verbal communication, pt better with written cues. See "skilled intervention" for more details on today's session. Pt would cont to benefit from skilled ST focusing on primarily receptive and expressive language but also at some point in rehab process focus on speech loudness and cognitive communication skills. Pt cont to require skilled ST focusing primarily on language skills. SLP thinks pt may benefit from a therapy hiatus with ST following this current plan of care, or at a time convenient for pt to take a break prior to the end of this plan of care. Clarification on pt's insurance plan next week.    Speech Therapy Frequency  2x / week    Duration  --   12 weeks or 25 total visits   Treatment/Interventions  Environmental controls;Functional tasks;Compensatory techniques;Multimodal communcation approach;SLP instruction and feedback;Cueing hierarchy;Language facilitation;Cognitive reorganization;Internal/external aids;Patient/family education    Potential to Achieve Goals  Good    Potential Considerations  Co-morbidities;Ability to learn/carryover information;Severity of impairments;Other (comment)       Patient will benefit from skilled therapeutic intervention in order to improve the following deficits and impairments:   Aphasia  Dysarthria and anarthria  Cognitive  communication deficit    Problem List Patient Active Problem List   Diagnosis Date Noted  . Goals of care, counseling/discussion 12/16/2018  . Palliative care by specialist   . Glioblastoma multiforme of temporal lobe ([facility]) 11/11/2018  . Spastic hemiparesis ([place])   . Cerebral edema (HCC)   . Intracranial tumor ([place])   . Steroid-induced hyperglycemia   . Spastic hemiplegia affecting  nondominant side ([facility])   . Hyponatremia   . Transaminitis   . Leucocytosis   . Right spastic hemiplegia ([place]) 10/21/2018  . Dysphagia 10/21/2018  . 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 Brain mass   . ICH (intracerebral hemorrhage) ([place]) 10/13/2018    [facility] ,[place], [place]  03/13/2019, 4:42 PM  [facility] [address] [place], [place], [address] Phone: [phone]   Fax:  [phone]   Name: NICOLO TOMKO MRN: [id number] Date of Birth: 1995-04-15

## 2019-03-13 NOTE — Therapy (Signed)
[facility] [address] [place], [place], [address] Phone: [phone]   Fax:  [phone]  Occupational Therapy Treatment  Patient Details  Name: Gregory Moreno MRN: [id number] Date of Birth: [birth year] Referring Provider (OT): [place]   Encounter Date: 03/13/2019  OT End of Session - 03/13/19 1621    Visit Number  24    Number of Visits  25    Date for OT Re-Evaluation  03/17/19    Authorization Type  30 VL    Authorization - Visit Number  16    Authorization - Number of Visits  30    OT Start Time  1450    OT Stop Time  1530    OT Time Calculation (min)  40 min    Activity Tolerance  Patient tolerated treatment well    Behavior During Therapy  [facility] for tasks assessed/performed       History reviewed. No pertinent past medical history.  Past Surgical History:  Procedure Laterality Date  . CRANIOTOMY Left 10/13/2018   Procedure: LEFT CRANIOTOMY FOR TUMOR RESECTION;  Surgeon: Judith Part, MD;  Location: [facility];  Service: Neurosurgery;  Laterality: Left;    There were no vitals filed for this visit.  Subjective Assessment - 03/13/19 1620    Subjective   A lot going on    Patient is accompanied by:  Family member    Currently in Pain?  No/denies    Pain Score  0-No pain                   OT Treatments/Exercises (OP) - 03/13/19 1624      Neurological Re-education Exercises   Other Exercises 1  Neuromuscular reeducation to address postural control and activation to help with limb functioning.  Worked from side sit to four point and back , to right and left sides.  Working on head, hip, shoulder realtionship in transitional movements.  Patient tolerated quadruped today better and even tall kneeling.      Other Exercises 2  Worked on increasing trunk activation through base of support by seated exercise on physioball.  Patient needed facil and cueing to initiate from LE's.  Did best with  mirroring and guided movement.                 OT Short Term Goals - 01/30/19 1630      OT SHORT TERM GOAL #2   Title  Patient will reach forward with shoulder flexion, elbow extension pattern to make contact with target in midline and 8-12' from chest with intermittent assist    Status  Achieved      OT SHORT TERM GOAL #3   Title  Patient will transfer from wheelchair to commode with no more than supervision assistance - transferring either toward right or toward left side    Status  Achieved      OT SHORT TERM GOAL #4   Title  Patient will grasp and release a cylindrical object 2-3" in diameter with min assist    Status  Achieved      OT SHORT TERM GOAL #5   Title  Patient will dress lower body with mod assist    Status  Achieved        OT Long Term Goals - 02/24/19 1722      OT LONG TERM GOAL #1   Title  Patient will dress himself with no greater than set up assistance  Status  On-going      OT LONG TERM GOAL #2   Title  Patient will shower with supervision    Status  Achieved      OT LONG TERM GOAL #3   Title  Patient will cut food on plate with modified independence    Status  On-going      OT LONG TERM GOAL #4   Title  Patient will demonstrate ability to retrieve a lightweight item (less than 2lb) from waist height or lower and transport 6-12 inches laterally with RUE    Status  On-going      OT LONG TERM GOAL #5   Title  Patient will demonstrate RUE  mid level reach with minimal LUE support to hit a target at chest height directly in front of body    Status  On-going            Plan - 03/13/19 1628    Clinical Impression Statement  Patient's dad indicates that there is no limit to therapy visits, will attempt to verify.  Patient continues to show improved balance and functional mobility, and parents report decreased burden of care.    OT Frequency  2x / week    OT Duration  12 weeks    OT Treatment/Interventions  Self-care/ADL training;DME  and/or AE instruction;Splinting;Balance training;Aquatic Therapy;Therapeutic activities;Therapeutic exercise;Cognitive remediation/compensation;Neuromuscular education;Functional Mobility Training;Visual/perceptual remediation/compensation;Electrical Stimulation;Manual Therapy;Patient/family education    Plan  NMR postural control Right limbs, goal check    Consulted and Agree with Plan of Care  Patient;Family member/caregiver    Family Member Consulted  DAD       Patient will benefit from skilled therapeutic intervention in order to improve the following deficits and impairments:           Visit Diagnosis: Hemiplegia of right dominant side due to noncerebrovascular etiology, unspecified hemiplegia type ([facility])  Muscle weakness (generalized)  Apraxia  Unsteadiness on feet  Visuospatial deficit    Problem List Patient Active Problem List   Diagnosis Date Noted  . Goals of care, counseling/discussion 12/16/2018  . Palliative care by specialist   . Glioblastoma multiforme of temporal lobe ([place]) 11/11/2018  . Spastic hemiparesis ([place])   . Cerebral edema (HCC)   . Intracranial tumor ([place])   . Steroid-induced hyperglycemia   . Spastic hemiplegia affecting nondominant side ([facility])   . Hyponatremia   . Transaminitis   . Leucocytosis   . Right spastic hemiplegia ([place]) 10/21/2018  . Dysphagia 10/21/2018  . 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 Brain mass   . ICH (intracerebral hemorrhage) ([facility]) 10/13/2018    Mariah Milling, OTR/L 03/13/2019, 4:30 PM  [facility] [address] [place], [place], [address] Phone: [phone]   Fax:  [phone]  Name: Gregory Moreno MRN: [id number] Date of Birth: 1995/04/16

## 2019-03-15 NOTE — Therapy (Addendum)
[facility] [address] [place], [place], [address] Phone: [phone]   Fax:  [phone]  Physical Therapy Treatment/Re-Cert  Patient Details  Name: Gregory Moreno MRN: [id number] Date of Birth: [birth year] Referring Provider (PT): Alger Simons, MD   Encounter Date: 03/13/2019   03/15/19 2056  PT Visits / Re-Eval  Visit Number 24  Number of Visits 40  Date for PT Re-Evaluation 05/16/19  Authorization  Authorization Type 30 visit limit - awaiting insurance auth.  Authorization - Visit Number 16 (count updated for 2021-PT/OT/ST combined)  Authorization - Number of Visits 30  PT Time Calculation  PT Start Time 1403  PT Stop Time 1446  PT Time Calculation (min) 43 min  PT - End of Session  Equipment Utilized During Treatment Gait belt  Activity Tolerance Patient tolerated treatment well  Behavior During Therapy WFL for tasks assessed/performed   No past medical history on file.  Past Surgical History:  Procedure Laterality Date  . CRANIOTOMY Left 10/13/2018   Procedure: LEFT CRANIOTOMY FOR TUMOR RESECTION;  Surgeon: Judith Part, MD;  Location: [facility];  Service: Neurosurgery;  Laterality: Left;    There were no vitals filed for this visit.          03/13/19 1410  Symptoms/Limitations  Subjective went up and down the steps with his RV the other day (5 steps) and walked in today with quad cane (has been walking with it the past couple of days). has been more nauseous recently  Limitations Sitting;Walking;Standing  How long can you walk comfortably? 100' with hemiwalker  Patient Stated Goals wants to improve his walking, get back to normal  Pain Assessment  Currently in Pain? No/denies               [facility] Adult PT Treatment/Exercise - 03/15/19 0001      Ambulation/Gait   Ambulation/Gait  Yes    Ambulation/Gait Assistance  4: Min guard;4: Min assist    Ambulation/Gait Assistance  Details  pt ambulated into clinc today with small base quad cane, pt needing min guard and ocassional min A at R knee to prevent recurvatum, cues to stand tall, with upright posture and "make right leg" when in stance. pt ambulated with step through pattern throughout session. needed frequent verbal cues for wider step width. also additional assist at times for R foot placement to prevent rotation of R ankle    Ambulation Distance (Feet)  230 Feet   approx. total throughout session    Assistive device  Other (Comment);Small based quad cane   R AFO   Gait Pattern  Decreased stance time - right;Decreased step length - left;Decreased weight shift to right;Decreased hip/knee flexion - right;Right genu recurvatum;Trunk flexed;Narrow base of support;Poor foot clearance - right;Step-through pattern;Decreased dorsiflexion - right    Ambulation Surface  Level;Indoor    Stairs  Yes    Stairs Assistance  4: Min assist;4: Min guard    Stairs Assistance Details (indicate cue type and reason)  needed initial cueing for proper technique, needing min A for R knee flexion to clear RLE due to weakness, when descending stairs, needing min A for balance as well as to help to recurvatum on RLE when accepting weight     Stair Management Technique  One rail Left;Step to pattern;Forwards    Number of Stairs  12    Height of Stairs  6    Curb  4: Min assist    Curb Details (indicate cue type  and reason)  x3 reps with small base quad cane, practiced "curb" on 8" step to mimic the step to get on and off the RV, needed assist to bring RLE up to step due to weakness, needed initial cues for technique       High Level Balance   High Level Balance Comments  1 rep retro gait in // bars with mod A for stepping RLE posteriorly and to help prevent pelvis from rotating, however pt with incr lateral trunk flexion when needing to weight bear on RLE to step LLE posteriorly      Neuro Re-ed    Neuro Re-ed Details   Standing in // bars  with mirror anteriorly, a couple of reps with no UE support with cues to shift weight to R and maintain position. With 2" step, attempted R step taps, however pt unable to perform due to weakness in R hip/knee flexors. With 2" step attempting weight shifting and standing tall on RLE and then tapping LLE onto step without UE support, pt with increased fear today and performed 1 rep and needing mod A from therapist due to pt shifting weight posteriorly                PT Short Term Goals - 02/18/19 1128      PT SHORT TERM GOAL #1   Title  Patient and pt's family will be independent with ongoing HEP designed to improve movement and strength in RLE.  ALL STGS DUE 02/11/19    Baseline  reports performing initial HEP    Time  4    Period  Weeks    Status  Achieved    Target Date  02/11/19      PT SHORT TERM GOAL #2   Title  Patient will stand at countertop for at least 8 minutes with supervision to increase tolerance for ADLs.    Baseline  not assessed at countertop, pt performing standing activities at edge of mat and standing in // bars in previous sessions >5 minutes    Period  Weeks    Status  Partially Met      PT SHORT TERM GOAL #3   Title  Pt will perform stand pivot vs. squat pivot transfer to either R or L from wc to mat table with mod I  in order to decrease caregiver burden.    Baseline  supervision for squat pivot transfer from w/c > mat table    Time  4    Period  Weeks    Status  Partially Met      PT SHORT TERM GOAL #4   Title  Pt will perform all bed mobility with supervision in order to decrease caregiver burden.    Baseline  continues to need min A for supine > sit, especially when having to roll towards pt's R    Time  4    Period  Weeks    Status  Not Met      PT SHORT TERM GOAL #5   Title  Pt will ambulate at least 115' with RW, R AFO and min guard in order to improve household mobility.    Baseline  115' with RW, R AFO and min A    Time  4    Period  Weeks     Status  Not Met      Revised STGs for re-cert: PT Short Term Goals - 03/17/19 2023      PT SHORT TERM GOAL #1  Title  Pt will ambulate at least 115' with supervision with small base quad cane in order to improve household mobility. ALL STGS DUE 04/14/19    Time  4    Period  Weeks    Status  New    Target Date  04/14/19      PT SHORT TERM GOAL #2   Title  Patient will undergo further assessment of gait speed with LRAD - goal to be written as appropriate.    Period  Weeks    Status  New      PT SHORT TERM GOAL #3   Title  Pt will perform at least 5 sit <> stands from mat table with proper technique with no cueing and mod I in order to improve functional transfers and demo improved LE strength.    Time  4    Period  Weeks    Status  New      PT SHORT TERM GOAL #4   Title  Pt will perform all bed mobility with supervision in order to decrease caregiver burden.    Time  4    Period  Weeks    Status  New      PT SHORT TERM GOAL #5   Title  Patient will perform at least 4 steps using single handrail and step to pattern with min guard in order to safely enter/exit the RV.    Time  4    Period  Weeks    Status  New        PT Long Term Goals - 03/13/19 1455      PT LONG TERM GOAL #1   Title  Patient and pt's family will be independent with final HEP designed to improve movement and strength in RLE.  ALL LTGS DUE 03/12/19    Time  12    Period  Weeks    Status  On-going      PT LONG TERM GOAL #2   Title  Pt will ambulate at least 56' with RW and supervision in order to improve household mobility.    Baseline  115' with quad base cane and min guard/supervision.    Time  12    Period  Weeks    Status  Partially Met      PT LONG TERM GOAL #3   Title  Pt will perform at least 10 sit <> stands from mat table with proper technique with no cueing and mod I in order to improve functional transfers and demo improved LE strength.    Time  12    Period  Weeks    Status   Deferred      PT LONG TERM GOAL #4   Title  Pt will perform all bed mobility from level mat table with mod I in order to decrease caregiver burden.    Time  12    Period  Weeks    Status  Not Met     PT LONG TERM GOAL #5   Title  Patient will perform at least 2 steps using single handrail and step to pattern with min A.    Baseline  8 steps using single handrail and step to pattern with min A ascending/descending    Time  12    Period  Weeks    Status  Achieved       Revised/on-going LTGs for re-cert:    PT Long Term Goals - 03/17/19 2026      PT LONG  TERM GOAL #1   Title  Patient and pt's family will be independent with final HEP designed to improve movement and strength in RLE.  ALL LTGS DUE 05/12/19    Baseline  on-going    Time  8    Period  Weeks    Status  New    Target Date  05/12/19      PT LONG TERM GOAL #2   Title  Pt will ambulate at least 200' over level indoor and outdoor paved surfaces with supervision in order to improve functional mobility with small base quad cane vs. LRAD    Time  8    Period  Weeks    Status  New      PT LONG TERM GOAL #3   Title  Pt will perform at least 10 sit <> stands from mat table with proper technique with no cueing and mod I in order to improve functional transfers and demo improved LE strength.    Time  8    Period  Weeks    Status  On-going      PT LONG TERM GOAL #4   Title  Pt will perform all bed mobility from level mat table with mod I in order to decrease caregiver burden.    Time  12    Period  Weeks    Status  On-going      PT LONG TERM GOAL #5   Title  Patient will perform at least 4 steps using single handrail and step to pattern with supervision in order to safely enter/exit the RV.    Baseline  8 steps using single handrail and step to pattern with min A ascending/descending    Time  8    Period  Weeks    Status  Achieved      PT LONG TERM GOAL #6   Title  Pt will perform a curb with min guard in order to  improve community mobility and independence.    Time  8    Period  Weeks    Status  New           03/15/19 2052  Plan  Clinical Impression Statement Pt ambulated into clinic today using small base quad cane - focus of today's session was gait training, stair training and NMR  for weight shifting towards R. Pt requiring min guard/min A on stairs for balance and to help decr genu recurvatum when descending stairs. Pt needing assist from therapist to clear RLE onto steps due to weakness in R hip/knee flexors. Pt demonstrating incr fear today when attempting to weight shift towards R and step LLE onto 2" block with no UE support. Pt has met LTG #5 today by being able to perform 2 steps using a single handrail. Pt partially met LTG #2 in regards to gait -ambulated into clinic today with small base quad cane using min guard/supervision. Ongoing LTG for HEP. Pt did not meet LTG #4, still needs min guard/supervision for bed mobility. Will re-cert for an additional 2x week for 8 weeks to continue to work on strength, balance, gait/stair training to improve functional mobility to incr independence. Revised STGs/LTGs as appropriate. Still waiting to hear back from insurance company regarding auth and visit limit.  Personal Factors and Comorbidities  (pt ungergoing chemo/radiation)  Examination-Activity Limitations Bed Mobility;Locomotion Level;Sit;Squat;Stairs;Stand;Transfers  Examination-Participation Restrictions Community Activity;School;Driving  Pt will benefit from skilled therapeutic intervention in order to improve on the following deficits Abnormal gait;Decreased activity tolerance;Decreased balance;Decreased  cognition;Decreased coordination;Decreased safety awareness;Decreased range of motion;Decreased mobility;Difficulty walking;Decreased strength;Decreased endurance;Impaired tone  Stability/Clinical Decision Making Evolving/Moderate complexity  Rehab Potential Good  PT Frequency 2x / week  PT  Duration 8 weeks  PT Treatment/Interventions ADLs/Self Care Home Management;Therapeutic exercise;Therapeutic activities;Functional mobility training;Stair training;Gait training;DME Instruction;Balance training;Neuromuscular re-education;Patient/family education;Orthotic Fit/Training;Wheelchair mobility training;Energy conservation;Passive range of motion  PT Next Visit Plan tall kneeling/sit <> stands with compliant surface under RLE. walking from waiting room, stair training. Weight shifting training - perched sitting on wedge or bolster edge of mat for increased LE extension activation, weight shift R - lift LLE, rockerboard -seated and standing for postural control/trunk righting.  NMR for R weight shifting, step taps with LLE, bed mobility/core and trunk strengthening.  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 Goals of care, counseling/discussion 12/16/2018  . Palliative care by specialist   . 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 Spastic hemiparesis ([place])   . Cerebral edema (HCC)   . Intracranial tumor ([place])   . Steroid-induced hyperglycemia   .  Spastic hemiplegia affecting nondominant side ([place])   . Hyponatremia   . Transaminitis   . Leucocytosis   . Right spastic hemiplegia ([place]) 10/21/2018  . Dysphagia 10/21/2018  . 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 Brain mass   . ICH (intracerebral hemorrhage) ([place]) 10/13/2018    Arliss Journey, PT, DPT  03/15/2019, 9:12 PM  [facility] [address] [place], [place], [address] Phone: [phone]   Fax:  [phone]  Name: Gregory Moreno MRN: [id number] Date of Birth: 03/18/1995

## 2019-03-16 ENCOUNTER — Ambulatory Visit: Payer: 59

## 2019-03-16 ENCOUNTER — Encounter: Payer: Self-pay | Admitting: Occupational Therapy

## 2019-03-16 ENCOUNTER — Ambulatory Visit: Payer: 59 | Attending: Gastroenterology | Admitting: Occupational Therapy

## 2019-03-16 ENCOUNTER — Ambulatory Visit: Payer: 59 | Admitting: Physical Therapy

## 2019-03-16 ENCOUNTER — Other Ambulatory Visit: Payer: Self-pay | Admitting: Internal Medicine

## 2019-03-16 ENCOUNTER — Inpatient Hospital Stay: Payer: 59

## 2019-03-16 ENCOUNTER — Inpatient Hospital Stay: Payer: 59 | Attending: Internal Medicine | Admitting: Internal Medicine

## 2019-03-16 ENCOUNTER — Other Ambulatory Visit: Payer: Self-pay

## 2019-03-16 VITALS — BP 130/73 | HR 81 | Temp 98.7°F | Resp 18 | Ht 69.0 in | Wt 185.8 lb

## 2019-03-16 DIAGNOSIS — C712 Malignant neoplasm of temporal lobe: Secondary | ICD-10-CM

## 2019-03-16 DIAGNOSIS — R4701 Aphasia: Secondary | ICD-10-CM

## 2019-03-16 DIAGNOSIS — R471 Dysarthria and anarthria: Secondary | ICD-10-CM | POA: Diagnosis present

## 2019-03-16 DIAGNOSIS — G8191 Hemiplegia, unspecified affecting right dominant side: Secondary | ICD-10-CM

## 2019-03-16 DIAGNOSIS — R2689 Other abnormalities of gait and mobility: Secondary | ICD-10-CM | POA: Insufficient documentation

## 2019-03-16 DIAGNOSIS — D696 Thrombocytopenia, unspecified: Secondary | ICD-10-CM | POA: Diagnosis not present

## 2019-03-16 DIAGNOSIS — M6281 Muscle weakness (generalized): Secondary | ICD-10-CM | POA: Diagnosis present

## 2019-03-16 DIAGNOSIS — R41841 Cognitive communication deficit: Secondary | ICD-10-CM

## 2019-03-16 DIAGNOSIS — Z79899 Other long term (current) drug therapy: Secondary | ICD-10-CM | POA: Insufficient documentation

## 2019-03-16 DIAGNOSIS — R482 Apraxia: Secondary | ICD-10-CM | POA: Diagnosis present

## 2019-03-16 DIAGNOSIS — R29818 Other symptoms and signs involving the nervous system: Secondary | ICD-10-CM | POA: Diagnosis present

## 2019-03-16 DIAGNOSIS — R2681 Unsteadiness on feet: Secondary | ICD-10-CM | POA: Insufficient documentation

## 2019-03-16 DIAGNOSIS — R41842 Visuospatial deficit: Secondary | ICD-10-CM | POA: Insufficient documentation

## 2019-03-16 LAB — CMP (CANCER CENTER ONLY)
ALT: 17 U/L (ref 0–44)
AST: 16 U/L (ref 15–41)
Albumin: 3.8 g/dL (ref 3.5–5.0)
Alkaline Phosphatase: 57 U/L (ref 38–126)
Anion gap: 9 (ref 5–15)
BUN: 7 mg/dL (ref 6–20)
CO2: 26 mmol/L (ref 22–32)
Calcium: 9.4 mg/dL (ref 8.9–10.3)
Chloride: 106 mmol/L (ref 98–111)
Creatinine: 0.8 mg/dL (ref 0.61–1.24)
GFR, Est AFR Am: >60 mL/min (ref >60)
GFR, Estimated: >60 mL/min (ref >60)
Glucose, Bld: 89 mg/dL (ref 70–99)
Potassium: 3.9 mmol/L (ref 3.5–5.1)
Sodium: 141 mmol/L (ref 135–145)
Total Bilirubin: 0.3 mg/dL (ref 0.3–1.2)
Total Protein: 7.5 g/dL (ref 6.5–8.1)

## 2019-03-16 LAB — CBC WITH DIFFERENTIAL (CANCER CENTER ONLY)
Abs Immature Granulocytes: 0.03 10*3/uL (ref 0.00–0.07)
Basophils Absolute: 0 10*3/uL (ref 0.0–0.1)
Basophils Relative: 1 %
Eosinophils Absolute: 0.1 10*3/uL (ref 0.0–0.5)
Eosinophils Relative: 2 %
HCT: 35.9 % — ABNORMAL LOW (ref 39.0–52.0)
Hemoglobin: 12.1 g/dL — ABNORMAL LOW (ref 13.0–17.0)
Immature Granulocytes: 1 %
Lymphocytes Relative: 14 %
Lymphs Abs: 0.6 10*3/uL — ABNORMAL LOW (ref 0.7–4.0)
MCH: 29.9 pg (ref 26.0–34.0)
MCHC: 33.7 g/dL (ref 30.0–36.0)
MCV: 88.6 fL (ref 80.0–100.0)
Monocytes Absolute: 0.4 10*3/uL (ref 0.1–1.0)
Monocytes Relative: 9 %
Neutro Abs: 3.1 10*3/uL (ref 1.7–7.7)
Neutrophils Relative %: 73 %
Platelet Count: 155 10*3/uL (ref 150–400)
RBC: 4.05 MIL/uL — ABNORMAL LOW (ref 4.22–5.81)
RDW: 16.1 % — ABNORMAL HIGH (ref 11.5–15.5)
WBC Count: 4.2 10*3/uL (ref 4.0–10.5)
nRBC: 0 % (ref 0.0–0.2)

## 2019-03-16 MED ORDER — ONDANSETRON HCL 4 MG PO TABS: 4.0000 mg | ORAL_TABLET | Freq: Three times a day (TID) | ORAL | 1 refills | Status: DC | PRN

## 2019-03-16 MED ORDER — TEMOZOLOMIDE 100 MG PO CAPS: 200.0000 mg/m2/d | ORAL_CAPSULE | Freq: Every day | ORAL | 0 refills | Status: DC

## 2019-03-16 MED ORDER — ONDANSETRON HCL 8 MG PO TABS: 8.0000 mg | ORAL_TABLET | Freq: Two times a day (BID) | ORAL | 1 refills | Status: DC | PRN

## 2019-03-16 NOTE — Progress Notes (Signed)
[facility] [place], [place] [address] [phone]   Interval Evaluation  Date of Service: 03/16/19 Patient Name: Gregory Moreno Patient MRN: [id number] Patient DOB: March 13, 1995 Provider: Ventura Sellers, MD  Identifying Statement:  Gregory Moreno is a 24 y.o. 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    02/09/2019 -  Chemotherapy   The patient had [No matching medication found in this treatment plan]  for chemotherapy treatment.      Biomarkers:  MGMT Unknown.  IDH 1/2 Wild type.  EGFR Unknown  TERT Unknown   Interval History:  Gregory Moreno presents today for follow up, now having completed first cycle of adjuvant Temodar.  Right side strength is stable or very slightly improved.  He continues to ambulate around the home with walker or cane assist.  Remains with dense left visual field impairment.  Language has been stable; still has good understanding but struggles to put sentences together.  Some nausea with Temodar this month.  Medications: Current Outpatient Medications on File Prior to Visit  Medication Sig Dispense Refill  . acetaminophen (TYLENOL) 325 MG tablet Take 2 tablets (650 mg total) by mouth every 4 (four) hours as needed for mild pain (temp > 100.5).    Marland Kitchen escitalopram (LEXAPRO) 5 MG tablet TAKE 1 TABLET BY MOUTH EVERYDAY AT BEDTIME 30 tablet 0  . ondansetron (ZOFRAN) 8 MG tablet Take 1 tablet (8 mg total) by mouth 2 (two) times daily as needed (nausea and vomiting). May take 30-60 minutes prior  to Temodar administration if nausea/vomiting occurs. 30 tablet 1  . temozolomide (TEMODAR) 100 MG capsule Take 3 capsules (300 mg total) by mouth daily. May take on an empty stomach to decrease nausea & vomiting. (Patient not taking: Reported on 03/16/2019) 15 capsule 0   No current facility-administered medications on file prior to visit.    Allergies: No Known Allergies Past Medical History: No past medical history on file. Past Surgical History:  Past Surgical History:  Procedure Laterality Date  . CRANIOTOMY Left 10/13/2018   Procedure: LEFT CRANIOTOMY FOR TUMOR RESECTION;  Surgeon: Judith Part, MD;  Location: [facility];  Service: Neurosurgery;  Laterality: Left;   Social History:  Social History   Socioeconomic History  . Marital status: Single    Spouse name: Not on file  . Number of children: Not on file  . Years of education: Not on file  . Highest education level: Not on file  Occupational History  . Not on file  Tobacco Use  . Smoking status: Never Smoker  . Smokeless tobacco: Never Used  Substance and Sexual Activity  . Alcohol use: Not on file  . Drug use: Not on file  . Sexual activity: Not on file  Other Topics Concern  . Not on file  Social History Narrative  . Not on file   Social Determinants of Health   Financial Resource Strain:   . Difficulty of Paying Living Expenses: Not on file  Food Insecurity:   . Worried About Charity fundraiser in the Last Year: Not on  file  . [place] in the Last Year: Not on file  Transportation Needs:   . Lack of Transportation (Medical): Not on file  . Lack of Transportation (Non-Medical): Not on file  Physical Activity:   . Days of Exercise per Week: Not on file  . Minutes of Exercise per Session: Not on file  Stress:   . Feeling of Stress : Not on file  Social Connections:   . Frequency of Communication with Friends and Family: Not on file  . Frequency of Social Gatherings with Friends and Family: Not on  file  . Attends Religious Services: Not on file  . Active Member of Clubs or Organizations: Not on file  . Attends Archivist Meetings: Not on file  . Marital Status: Not on file  Intimate Partner Violence:   . Fear of Current or Ex-Partner: Not on file  . Emotionally Abused: Not on file  . Physically Abused: Not on file  . Sexually Abused: Not on file   Family History: No family history on file.  Review of Systems: Constitutional: Denies fevers, chills or abnormal weight loss Eyes: Denies blurriness of vision Ears, nose, mouth, throat, and face: Denies mucositis or sore throat Respiratory: Denies cough, dyspnea or wheezes Cardiovascular: Denies palpitation, chest discomfort or lower extremity swelling Gastrointestinal:  Denies nausea, constipation, diarrhea GU: Denies dysuria or incontinence Skin: Denies abnormal skin rashes Neurological: Per HPI Musculoskeletal: Denies joint pain, back or neck discomfort. No decrease in ROM Behavioral/Psych: Denies anxiety, disturbance in thought content, and mood instability  Physical Exam: Vitals:   03/16/19 1137  BP: 130/73  Pulse: 81  Resp: 18  Temp: 98.7 F (37.1 C)  SpO2: 99%   KPS: 60. General: Alert, cooperative, pleasant, in no acute distress Head: Craniotomy scar noted, dry and intact. EENT: No conjunctival injection or scleral icterus. Oral mucosa moist Lungs: Resp effort normal Cardiac: Regular rate and rhythm Abdomen: Soft, non-distended abdomen Skin: No rashes cyanosis or petechiae. Extremities: No clubbing or edema  Neurologic Exam: Mental Status: Awake, alert, attentive to examiner. Oriented to self and environment. Language is densely impaired with regards to fluency.  Comprehension impaired to multi-step complex commands.  Simple repetition is preserved. Cranial Nerves: Visual acuity is grossly normal. Dense R homonymous hemianopia. Extra-ocular movements intact. No ptosis. Face is symmetric, tongue  midline. Motor: Tone and bulk are normal. Power is 3/5 in right arm and leg, 5/5 on left side. Reflexes are symmetric, no pathologic reflexes present. Sensory: Intact to light touch and temperature Gait: Deferred  Labs: I have reviewed the data as listed    Component Value Date/Time   NA 142 02/09/2019 1100   K 3.5 02/09/2019 1100   CL 106 02/09/2019 1100   CO2 26 02/09/2019 1100   GLUCOSE 109 (H) 02/09/2019 1100   BUN 7 02/09/2019 1100   CREATININE 0.87 02/09/2019 1100   CALCIUM 9.6 02/09/2019 1100   PROT 7.8 02/09/2019 1100   ALBUMIN 3.8 02/09/2019 1100   AST 20 02/09/2019 1100   ALT 43 02/09/2019 1100   ALKPHOS 64 02/09/2019 1100   BILITOT 0.4 02/09/2019 1100   GFRNONAA >60 02/09/2019 1100   GFRAA >60 02/09/2019 1100   Lab Results  Component Value Date   WBC 4.2 03/16/2019   NEUTROABS 3.1 03/16/2019   HGB 12.1 (L) 03/16/2019   HCT 35.9 (L) 03/16/2019   MCV 88.6 03/16/2019   PLT 155 03/16/2019     Assessment/Plan 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  Labs are within normal limts.  We recommended initiating treatment with cycle #2 Temozolomide 200 mg/m2, on for five days and off for twenty three days in twenty eight day cycles. The patient will have a complete blood count performed on days 21 and 28 of each cycle, and a comprehensive metabolic panel performed on day 28 of each cycle. Labs may need to be performed more often. Zofran will prescribed for home use for nausea/vomiting.   Chemotherapy should be held for the following:  ANC less than 1,000  Platelets less than 100,000  LFT or creatinine greater than 2x ULN  If clinical concerns/contraindications develop  We continue to be very pleased with his functional improvement through aggressive neuro-directed PT and OT.  He will follow up again in 4 weeks with MRI brain for evaluation.  All questions  were answered. The patient knows to call the clinic with any problems, questions or concerns. No barriers to learning were detected.  The total time spent in the encounter was 30 minutes and more than 50% was on counseling and review of test results   Ventura Sellers, MD Medical Director of Neuro-Oncology [facility] 03/16/19 11:43 AM

## 2019-03-16 NOTE — Therapy (Signed)
[facility] [address] [place], [place], [address] Phone: [phone]   Fax:  [phone]  Occupational Therapy Treatment  Patient Details  Name: Gregory Moreno MRN: [id number] Date of Birth: April 19, 1995 Referring Provider (OT): Marlowe Shores   Encounter Date: 03/16/2019  OT End of Session - 03/16/19 1545    Visit Number  25    Number of Visits  25    Date for OT Re-Evaluation  03/17/19    Authorization Type  30 VL - updating insurance information 03/16/19    Authorization - Visit Number  17    Authorization - Number of Visits  30    OT Start Time  [id number]    OT Stop Time  1530    OT Time Calculation (min)  45 min    Activity Tolerance  Patient tolerated treatment well    Behavior During Therapy  [facility] for tasks assessed/performed       History reviewed. No pertinent past medical history.  Past Surgical History:  Procedure Laterality Date  . CRANIOTOMY Left 10/13/2018   Procedure: LEFT CRANIOTOMY FOR TUMOR RESECTION;  Surgeon: Judith Part, MD;  Location: [facility];  Service: Neurosurgery;  Laterality: Left;    There were no vitals filed for this visit.  Subjective Assessment - 03/16/19 1537    Subjective   OK    Patient is accompanied by:  Family member    Currently in Pain?  No/denies    Pain Score  0-No pain                   OT Treatments/Exercises (OP) - 03/16/19 0001      ADLs   ADL Comments  Reviewed long term goals, and need for rote practice of self care skills if hoping to reduce caregiver burden.  Reviewed paln to recertify for an additional 8 weeks of OP OT to address reamining and new long term goals. Dad in agreement.  Note:  Patient starts new chemo round at higher dosage this week.  Need to monitor reaction and see if able to tolerate therapy.        Neurological Re-education Exercises   Other Exercises 1  Neuromuscular reeducation to address postural control and activation of  core musculature.  Working with patient on transitioning in and out of rotational flexed positions to activate right limbs into surface, and facilitating right limbs as important aspects of base of support.      Other Exercises 2  Patient with significant reduction in tension  in right arm following weight bearing positions.  Followed with supine pre-reaching activities with PVC frame.  Working to both isolate shoulder and elbow movement, and work in concert for shoulder flexion with elbow extension. and shoulder extension with elbow flexion.      Other Grasp and Release Exercises   Patient's dad indicates that he is struggling to get brace on at noght (hand splint) He was unsure if Gregory Moreno was taking medication to help control muscle tension.  Discussed that this may be a good option to help reduce tone.               OT Education - 03/16/19 1544    Education Details  Reviewed long term goals, plan to recertify for an additional 8 weeks to address reamining and new goals    Person(s) Educated  Patient;Parent(s)    Methods  Explanation    Comprehension  Verbalized understanding  OT Short Term Goals - 03/16/19 1554      OT SHORT TERM GOAL #5   Title  Patient will dress lower body with mod assist      Additional Short Term Goals   Additional Short Term Goals  Yes      OT SHORT TERM GOAL #6   Title  Patient will don/doff underwear, pants, and left shoe with min assist    Time  4    Period  Weeks    Status  New    Target Date  04/15/19      OT SHORT TERM GOAL #7   Title  Patient will complete shower transfer with supervision    Time  4    Period  Weeks    Status  New      OT SHORT TERM GOAL #8   Title  Patient will bathe self in shower with supervision and cueing assistance    Time  4    Period  Weeks    Status  New        OT Long Term Goals - 03/16/19 1558      OT LONG TERM GOAL #1   Title  Patient will dress himself with no greater than set up assistance    Time  8     Period  Weeks    Target Date  05/15/19      OT LONG TERM GOAL #2   Title  Patient will shower with supervision    Time  12    Period  Weeks    Status  On-going   now short temr goal     OT LONG TERM GOAL #3   Title  Patient will cut food on plate with modified independence    Time  8    Period  Weeks    Status  On-going      OT LONG TERM GOAL #4   Title  Patient will demonstrate ability to retrieve a lightweight item (less than 2lb) from waist height or lower and transport 6-12 inches laterally with RUE    Time  8    Period  Weeks    Status  On-going      OT LONG TERM GOAL #5   Title  Patient will demonstrate RUE  mid level reach with minimal LUE support to hit a target at chest height directly in front of body    Time  8    Period  Weeks    Status  On-going            Plan - 03/16/19 1547    Clinical Impression Statement  Patient has made significant improvement with functional mobility, and this is carrying over to improved independence with showering and toileting at home.  Patient is no longer using wheelchair, and is walking throughout house with cane.  Patient would continue to benefit from additional OT as he can tolerate with new dosage of chemo starting this week.  Patient has only missed therapy x1 with illness and it was a day he did not take nausea medication.    OT Frequency  2x / week    OT Duration  8 weeks    OT Treatment/Interventions  Self-care/ADL training;DME and/or AE instruction;Splinting;Balance training;Aquatic Therapy;Therapeutic activities;Therapeutic exercise;Cognitive remediation/compensation;Neuromuscular education;Functional Mobility Training;Visual/perceptual remediation/compensation;Electrical Stimulation;Manual Therapy;Patient/family education    Plan  Postural control, NMR RUE, discuss goals,    Consulted and Agree with Plan of Care  Patient;Family member/caregiver    Family Member  Consulted  DAD       Patient will benefit from  skilled therapeutic intervention in order to improve the following deficits and impairments:           Visit Diagnosis: 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 Goals of care, counseling/discussion 12/16/2018  . Palliative care by specialist   . 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 Spastic hemiparesis ([place])   . Cerebral edema (HCC)   . Intracranial tumor ([place])   . Steroid-induced hyperglycemia   . Spastic hemiplegia affecting nondominant side ([place])   . Hyponatremia   . Transaminitis   . Leucocytosis   . Right spastic hemiplegia ([place]) 10/21/2018  . Dysphagia 10/21/2018  . 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 Brain mass   . ICH (intracerebral hemorrhage) ([place]) 10/13/2018    Mariah Milling, OTR/L 03/16/2019, 4:03 PM  [facility] [address] [place], [place], [address] Phone: [phone]   Fax:  [phone]  Name: Gregory Moreno MRN: [id number] Date of Birth: 03-Nov-1995

## 2019-03-17 ENCOUNTER — Telehealth: Payer: Self-pay | Admitting: Internal Medicine

## 2019-03-17 NOTE — Telephone Encounter (Signed)
Scheduled appt per 3/1 los. ° °Sent a message to HIM pool to get a calendar mailed out. °

## 2019-03-17 NOTE — Addendum Note (Signed)
Addended by: Arliss Journey on: 03/17/2019 08:29 PM   Modules accepted: Orders

## 2019-03-17 NOTE — Telephone Encounter (Signed)
rx

## 2019-03-17 NOTE — Therapy (Signed)
[facility] [address] [place], [place], [address] Phone: [phone]   Fax:  [phone]  Physical Therapy Treatment  Patient Details  Name: Gregory Moreno MRN: [id number] Date of Birth: 1995-12-06 Referring Provider (PT): Alger Simons, MD   Encounter Date: 03/16/2019  PT End of Session - 03/17/19 2035    Visit Number  25    Number of Visits  40    Date for PT Re-Evaluation  05/16/19    Authorization Type  30 visit limit - awaiting insurance auth.    Authorization - Visit Number  17   count updated for 2021-PT/OT/ST combined   Authorization - Number of Visits  30    PT Start Time  [id number]   pt arrived late   PT Stop Time  1446    PT Time Calculation (min)  39 min    Equipment Utilized During Treatment  Gait belt    Activity Tolerance  Patient tolerated treatment well    Behavior During Therapy  WFL for tasks assessed/performed       No past medical history on file.  Past Surgical History:  Procedure Laterality Date  . CRANIOTOMY Left 10/13/2018   Procedure: LEFT CRANIOTOMY FOR TUMOR RESECTION;  Surgeon: Judith Part, MD;  Location: [facility];  Service: Neurosurgery;  Laterality: Left;    There were no vitals filed for this visit.  Subjective Assessment - 03/16/19 1411    Subjective  had a good weekend, has been doing all of his walking with the cane. no falls.    Limitations  Sitting;Walking;Standing    How long can you walk comfortably?  100' with hemiwalker    Patient Stated Goals  wants to improve his walking, get back to normal    Currently in Pain?  No/denies                       [facility] Adult PT Treatment/Exercise - 03/17/19 0001      Transfers   Sit to Stand  5: Supervision;4: Min guard;Without upper extremity assist;With upper extremity assist    Sit to Stand Details  Verbal cues for sequencing;Verbal cues for technique    Stand to Sit  5: Supervision    Stand to Sit  Details (indicate cue type and reason)  Tactile cues for weight shifting;Tactile cues for initiation;Visual cues for safe use of DME/AE;Tactile cues for weight beaing    Comments  2 x 7 reps sit <> stands from mat table with LLE on compliant disc surface for increased weight bearing and weight shifting towards RLE, cues for technique as well as balance in standing with no UE support - manual and tactile cues from therapist to activate RLE and shift to R before sitting back down to mat with no UE support, cues for eccentric control       Ambulation/Gait   Ambulation/Gait  Yes    Ambulation/Gait Assistance  4: Min guard;4: Min assist    Ambulation/Gait Assistance Details  pt ambulated into clinic with small base quad cane, needed verbal cues for a "soft knee" on the R, with minimal A at times from therapist to block R knee from going into recurvatum. towards end of bout of gait, pt needed assist from therapist for foot placement for heel strike to decr ankle rotation due to weakness when clearing RLE    Ambulation Distance (Feet)  130 Feet   approx. from waiting room   Assistive device  Small based quad cane    Gait Pattern  Decreased stance time - right;Decreased step length - left;Decreased weight shift to right;Decreased hip/knee flexion - right;Right genu recurvatum;Trunk flexed;Narrow base of support;Poor foot clearance - right;Step-through pattern;Decreased dorsiflexion - right    Ambulation Surface  Level;Indoor      Neuro Re-ed    Neuro Re-ed Details   At edge of mat table with single UE on chair to pt's L, mirror anteriorly for visual feedback, stepping LLE over small black foam beam on floor and stepping back over to midline for weight shifting towards R 1 x 10 reps - pt needed assistance at pelvis to prevent excessive pelvic retraction on the R. Standing at edge of mat table with no UE support, LLE on 2" step to facilitate weight shifting towards R, needed tactile and verbal cues for R quad  activation - 1 x 7 reps pt reaching with LUE outside/superiorly and laterally outside of BOS to grab cone from pt's dad and then give cone back to therapist on pt's R. 2 x 7 reps with pt reaching for cone from dad and then performing a mini squat when placing it on elevated surface towards pt's R, min guard/min A for balance with pt having 3 episodes where unable to hold mini squat and sat back onto mat table.       Exercises   Exercises  Other Exercises    Other Exercises   supine on mat table 2 x 10 reps heel slides (with socks on), needed assist from therapist for knee flexion and to prevent RLE from ABDucting, able to perform better when not in full range               PT Short Term Goals - 03/17/19 2023      PT SHORT TERM GOAL #1   Title  Pt will ambulate at least 115' with supervision with small base quad cane in order to improve household mobility. ALL STGS DUE 04/14/19    Time  4    Period  Weeks    Status  New    Target Date  04/14/19      PT SHORT TERM GOAL #2   Title  Patient will undergo further assessment of gait speed with LRAD - goal to be written as appropriate.    Period  Weeks    Status  New      PT SHORT TERM GOAL #3   Title  Pt will perform at least 5 sit <> stands from mat table with proper technique with no cueing and mod I in order to improve functional transfers and demo improved LE strength.    Time  4    Period  Weeks    Status  New      PT SHORT TERM GOAL #4   Title  Pt will perform all bed mobility with supervision in order to decrease caregiver burden.    Time  4    Period  Weeks    Status  New      PT SHORT TERM GOAL #5   Title  Patient will perform at least 4 steps using single handrail and step to pattern with min guard in order to safely enter/exit the RV.    Time  4    Period  Weeks    Status  New        PT Long Term Goals - 03/17/19 2026      PT LONG TERM GOAL #1  Title  Patient and pt's family will be independent with final HEP  designed to improve movement and strength in RLE.  ALL LTGS DUE 05/12/19    Baseline  on-going    Time  8    Period  Weeks    Status  New    Target Date  05/12/19      PT LONG TERM GOAL #2   Title  Pt will ambulate at least 200' over level indoor and outdoor paved surfaces with supervision in order to improve functional mobility with small base quad cane vs. LRAD    Time  8    Period  Weeks    Status  New      PT LONG TERM GOAL #3   Title  Pt will perform at least 10 sit <> stands from mat table with proper technique with no cueing and mod I in order to improve functional transfers and demo improved LE strength.    Time  8    Period  Weeks    Status  On-going      PT LONG TERM GOAL #4   Title  Pt will perform all bed mobility from level mat table with mod I in order to decrease caregiver burden.    Time  12    Period  Weeks    Status  On-going      PT LONG TERM GOAL #5   Title  Patient will perform at least 4 steps using single handrail and step to pattern with supervision in order to safely enter/exit the RV.    Baseline  8 steps using single handrail and step to pattern with min A ascending/descending    Time  8    Period  Weeks    Status  Achieved      PT LONG TERM GOAL #6   Title  Pt will perform a curb with min guard in order to improve community mobility and independence.    Time  8    Period  Weeks    Status  New            Plan - 03/17/19 2035    Clinical Impression Statement  Focus of today's skilled session focused on gait training, RLE > LLE strengthening, NMR for weight shifting towards R. Pt able to demo improved dynamic balance today by reaching outside of BOS to grab cones with no UE support - had increased difficulty with performing sustained mini squat to place cone on a lower object and had a couple instances of losing balance posteriorly back onto mat table. With weight shifting activities towards R and stepping over beam with L, needed min/mod A at  pelvis to prevent excessive pelvic retraction on R. Will continue to progress towards LTGs.    Personal Factors and Comorbidities  --   pt ungergoing chemo/radiation   Examination-Activity Limitations  Bed Mobility;Locomotion Level;Sit;Squat;Stairs;Stand;Transfers    Examination-Participation Restrictions  Community Activity;School;Driving    Stability/Clinical Decision Making  Evolving/Moderate complexity    Rehab Potential  Good    PT Frequency  2x / week    PT Duration  8 weeks    PT Treatment/Interventions  ADLs/Self Care Home Management;Therapeutic exercise;Therapeutic activities;Functional mobility training;Stair training;Gait training;DME Instruction;Balance training;Neuromuscular re-education;Patient/family education;Orthotic Fit/Training;Wheelchair mobility training;Energy conservation;Passive range of motion    PT Next Visit Plan  tall kneeling/sit <> stands with compliant surface under RLE. walking from waiting room, stair training. Weight shifting training - perched sitting on wedge or bolster edge of mat  for increased LE extension activation, weight shift R - lift LLE, rockerboard -seated and standing for postural control/trunk righting.  NMR for R weight shifting, step taps with LLE, bed mobility/core and trunk strengthening.    PT Home Exercise Plan  [id number]    Consulted and Agree with Plan of Care  Patient;Family member/caregiver    Family Member Consulted  dad, Dennon       Patient will benefit from skilled therapeutic intervention in order to improve the following deficits and impairments:  Abnormal gait, Decreased activity tolerance, Decreased balance, Decreased cognition, Decreased coordination, Decreased safety awareness, Decreased range of motion, Decreased mobility, Difficulty walking, Decreased strength, Decreased endurance, Impaired tone  Visit Diagnosis: Muscle weakness (generalized)  Unsteadiness on feet  Other symptoms and signs involving the nervous  system  Other abnormalities of gait and mobility  Hemiplegia affecting right dominant side, unspecified etiology, unspecified hemiplegia type [facility])     Problem List Patient Active Problem List   Diagnosis Date Noted  . Goals of care, counseling/discussion 12/16/2018  . Palliative care by specialist   . 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 Spastic hemiparesis ([place])   . Cerebral edema (HCC)   . Intracranial tumor ([place])   . Steroid-induced hyperglycemia   . Spastic hemiplegia affecting nondominant side ([place])   . Hyponatremia   . Transaminitis   . Leucocytosis   . Right spastic hemiplegia ([place]) 10/21/2018  . Dysphagia 10/21/2018  . 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 Brain mass   . ICH (intracerebral hemorrhage) ([place]) 10/13/2018    Arliss Journey, PT, DPT  03/17/2019, 8:44 PM  [facility] [address] [place], [place], [address] Phone: [phone]   Fax:  [phone]  Name: Gregory Moreno MRN: [id number] Date of Birth: 1995-11-02

## 2019-03-17 NOTE — Therapy (Signed)
[facility] [address] [place], [place], [address] Phone: [phone]   Fax:  [phone]  Speech Language Pathology Treatment  Patient Details  Name: Gregory Moreno MRN: [id number] Date of Birth: [birth year] Referring Provider (SLP): Alger Simons, MD   Encounter Date: 03/16/2019  End of Session - 03/17/19 1614    Visit Number  24    Number of Visits  25    Date for SLP Re-Evaluation  03/17/19    Authorization - Visit Number  15    Authorization - Number of Visits  30    SLP Start Time  2111    SLP Stop Time   7356    SLP Time Calculation (min)  51 min    Activity Tolerance  Patient tolerated treatment well       History reviewed. No pertinent past medical history.  Past Surgical History:  Procedure Laterality Date  . CRANIOTOMY Left 10/13/2018   Procedure: LEFT CRANIOTOMY FOR TUMOR RESECTION;  Surgeon: Judith Part, MD;  Location: [facility];  Service: Neurosurgery;  Laterality: Left;    There were no vitals filed for this visit.  Subjective Assessment - 03/16/19 1542    Subjective  "Stuff." (what he did in PT)    Patient is accompained by:  Family member   father   Currently in Pain?  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 Pt named 90% of the pictured instruments an "airspeed indicator" spontaneously, with error awareness approx 50% of the time. Overall success independently was <20%. With max, near-total A multimodal cues, pt success was 70%. SLP noted father providing  phonemic cues to pt to assist naming. Receptively, pt was able to ID 7/7 instruments (f:12) on the first attempt.      Assessment / Recommendations / Plan   Plan  Continue with current plan of care      Progression Toward Goals   Progression toward goals  Not progressing toward goals (comment)   severity of deficits        SLP Short Term Goals - 01/30/19 1508      SLP SHORT TERM GOAL #1   Title  pt will complete aphasia assessment of auditory comprehension and verbal expression within 1-2 sessions    Status  Achieved      SLP SHORT TERM GOAL #2   Title  pt will demo understanding of more complex commands (spoken) with 80% and occasional min cues over three sessions    Status  Not Met      SLP SHORT TERM GOAL #3   Title  pt will answer pertinent questions with at least 3 words    Status  Not Met      SLP SHORT TERM GOAL #4   Title  pt will demo sustained/selective attention necessary to complete efficacious speech therapy in 6 of the first 8 sessions    Baseline  12-31-18, 01-13-19    Period  --   or 8 sessions   Status  Partially Met  SLP Long Term Goals - 03/17/19 1617      SLP LONG TERM GOAL #1   Title  pt will demo understanding of commands (spoken) with 85% and rare min cues over three sessions    Baseline  02-06-19    Time  1    Period  Weeks   or 17 sessions, for all LTGs   Status  On-going      SLP LONG TERM GOAL #2   Title  pt will answer questions with [facility] verbal responses 75% of the time, in 3 sessions    Baseline  01-27-19    Time  1    Period  Weeks    Status  On-going      SLP LONG TERM GOAL #3   Title  pt will sustain loud /a/ with average mid 70s dB over 4 sessions    Time  5    Period  Weeks    Status  Deferred   due to focus on language     SLP LONG TERM GOAL #4   Title  pt will engage in simple conversation for 5 minutes with average loudness mid 60s dB over 3 sessions    Time  5    Period  Weeks    Status  Deferred   due to focus  on language      Plan - 03/17/19 1615    Clinical Impression Statement  Pt with mod Broca's aphasia, dysarthria/voice, and cognitive communication deficits. Pt cont to require consistent SLP cues for verbal communication, pt better with written cues. See "skilled intervention" for more details on today's session. Pt would cont to benefit from skilled ST focusing on primarily receptive and expressive language but also at some point in rehab process focus on speech loudness and cognitive communication skills. Pt cont to require skilled ST focusing primarily on language skills. SLP thinks pt may benefit from a therapy hiatus with ST following this current plan of care, or at a time in the next 1-2 weeks. Discuss this with pt and family next visit. Latest information on pt's insurance plan is no visit limit, copay of $40/discipline.    Speech Therapy Frequency  2x / week    Duration  --   12 weeks or 25 total visits   Treatment/Interventions  Environmental controls;Functional tasks;Compensatory techniques;Multimodal communcation approach;SLP instruction and feedback;Cueing hierarchy;Language facilitation;Cognitive reorganization;Internal/external aids;Patient/family education    Potential to Achieve Goals  Good    Potential Considerations  Co-morbidities;Ability to learn/carryover information;Severity of impairments;Other (comment)       Patient will benefit from skilled therapeutic intervention in order to improve the following deficits and impairments:   Aphasia  Dysarthria and anarthria  Cognitive communication deficit    Problem List Patient Active Problem List   Diagnosis Date Noted  . Goals of care, counseling/discussion 12/16/2018  . Palliative care by specialist   . 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 Spastic hemiparesis ([place])   . Cerebral edema (HCC)   . Intracranial tumor ([place])   . Steroid-induced hyperglycemia   . Spastic hemiplegia affecting  nondominant side ([place])   . Hyponatremia   . Transaminitis   . Leucocytosis   . Right spastic hemiplegia ([place]) 10/21/2018  . Dysphagia 10/21/2018  . 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 Brain mass   . ICH (intracerebral hemorrhage) ([place]) 10/13/2018    Cyana Shook ,[place], CCC-SLP  03/17/2019, 4:18 PM  [facility] [address]  [place], [place], [address] Phone: [phone]   Fax:  [phone]   Name: Gregory Moreno MRN: [id number] Date of Birth: 04-10-1995

## 2019-03-18 ENCOUNTER — Other Ambulatory Visit: Payer: Self-pay

## 2019-03-18 ENCOUNTER — Encounter: Payer: Self-pay | Admitting: Occupational Therapy

## 2019-03-18 ENCOUNTER — Ambulatory Visit: Payer: 59

## 2019-03-18 ENCOUNTER — Ambulatory Visit: Payer: 59 | Admitting: Occupational Therapy

## 2019-03-18 ENCOUNTER — Encounter: Payer: Self-pay | Admitting: Physical Therapy

## 2019-03-18 ENCOUNTER — Ambulatory Visit: Payer: 59 | Admitting: Physical Therapy

## 2019-03-18 DIAGNOSIS — R29818 Other symptoms and signs involving the nervous system: Secondary | ICD-10-CM

## 2019-03-18 DIAGNOSIS — G8191 Hemiplegia, unspecified affecting right dominant side: Secondary | ICD-10-CM | POA: Diagnosis not present

## 2019-03-18 DIAGNOSIS — R471 Dysarthria and anarthria: Secondary | ICD-10-CM

## 2019-03-18 DIAGNOSIS — M6281 Muscle weakness (generalized): Secondary | ICD-10-CM

## 2019-03-18 DIAGNOSIS — R2681 Unsteadiness on feet: Secondary | ICD-10-CM

## 2019-03-18 DIAGNOSIS — R4701 Aphasia: Secondary | ICD-10-CM

## 2019-03-18 DIAGNOSIS — R41842 Visuospatial deficit: Secondary | ICD-10-CM

## 2019-03-18 DIAGNOSIS — R482 Apraxia: Secondary | ICD-10-CM

## 2019-03-18 DIAGNOSIS — R41841 Cognitive communication deficit: Secondary | ICD-10-CM

## 2019-03-18 NOTE — Therapy (Signed)
[facility] [address] [place], [place], [address] Phone: [phone]   Fax:  [phone]  Occupational Therapy Treatment  Patient Details  Name: Gregory Moreno MRN: [id number] Date of Birth: 05-26-1995 Referring Provider (OT): Marlowe Shores   Encounter Date: 03/18/2019  OT End of Session - 03/18/19 1649    Visit Number  26    Number of Visits  41    Date for OT Re-Evaluation  05/17/19    Authorization Type  No VL - No auth required - checking for aquatic therapy benefit    OT Start Time  1445    OT Stop Time  1527    OT Time Calculation (min)  42 min    Activity Tolerance  Patient tolerated treatment well    Behavior During Therapy  [facility] for tasks assessed/performed       History reviewed. No pertinent past medical history.  Past Surgical History:  Procedure Laterality Date  . CRANIOTOMY Left 10/13/2018   Procedure: LEFT CRANIOTOMY FOR TUMOR RESECTION;  Surgeon: Judith Part, MD;  Location: [facility];  Service: Neurosurgery;  Laterality: Left;    There were no vitals filed for this visit.  Subjective Assessment - 03/18/19 1528    Subjective   good    Patient is accompanied by:  Family member    Currently in Pain?  No/denies    Pain Score  0-No pain                   OT Treatments/Exercises (OP) - 03/18/19 0001      ADLs   LB Dressing  Mom indicates patient is doing most of his dressing now.      Toileting  Mom reports patient is standing to urinate now.        Neurological Re-education Exercises   Other Exercises 1  Neuromuscular reeducation to address forced use of right UE in familiar functional situation.  Worked in standing static to dynamic to address ability to utilize RUE to turn on/off water faucet, open / close refrigerator door, wash dish while stabilizing with left hand.  Patient with poor ability to maintain active right leg and poor trunk control during these activities, but  was able to remain focused on goal of functional use of RUE.  Patient's mom present, and able to observe patient's functioning in home like environment.  Patient more ambulatory at home, and she will help him determine safe functional activities he can manage.                  OT Education - 03/18/19 1649    Education Details  Reviewed benefit of forcing AJ into functional situations, and encouraging use of RUE    Person(s) Educated  Patient;Parent(s)    Methods  Explanation;Demonstration    Comprehension  Need further instruction       OT Short Term Goals - 03/16/19 1554      OT SHORT TERM GOAL #5   Title  Patient will dress lower body with mod assist      Additional Short Term Goals   Additional Short Term Goals  Yes      OT SHORT TERM GOAL #6   Title  Patient will don/doff underwear, pants, and left shoe with min assist    Time  4    Period  Weeks    Status  New    Target Date  04/15/19      OT SHORT TERM  GOAL #7   Title  Patient will complete shower transfer with supervision    Time  4    Period  Weeks    Status  New      OT SHORT TERM GOAL #8   Title  Patient will bathe self in shower with supervision and cueing assistance    Time  4    Period  Weeks    Status  New        OT Long Term Goals - 03/16/19 1558      OT LONG TERM GOAL #1   Title  Patient will dress himself with no greater than set up assistance    Time  8    Period  Weeks    Target Date  05/15/19      OT LONG TERM GOAL #2   Title  Patient will shower with supervision    Time  12    Period  Weeks    Status  On-going   now short temr goal     OT LONG TERM GOAL #3   Title  Patient will cut food on plate with modified independence    Time  8    Period  Weeks    Status  On-going      OT LONG TERM GOAL #4   Title  Patient will demonstrate ability to retrieve a lightweight item (less than 2lb) from waist height or lower and transport 6-12 inches laterally with RUE    Time  8    Period   Weeks    Status  On-going      OT LONG TERM GOAL #5   Title  Patient will demonstrate RUE  mid level reach with minimal LUE support to hit a target at chest height directly in front of body    Time  8    Period  Weeks    Status  On-going            Plan - 03/18/19 1650    Clinical Impression Statement  Patient continues to improve with balance, endurance, and confidence with functional mobility such that we can begin to work on functional use of RUE in standing, in more functional, home-like situations.    OT Frequency  2x / week    OT Duration  8 weeks    OT Treatment/Interventions  Self-care/ADL training;DME and/or AE instruction;Splinting;Balance training;Aquatic Therapy;Therapeutic activities;Therapeutic exercise;Cognitive remediation/compensation;Neuromuscular education;Functional Mobility Training;Visual/perceptual remediation/compensation;Electrical Stimulation;Manual Therapy;Patient/family education    Plan  Postural control, NMR RUE, discuss goals, forced functional use of BUE up on feet - Static to dynaic stand balance    Consulted and Agree with Plan of Care  Patient;Family member/caregiver    Family Member Consulted  mom       Patient will benefit from skilled therapeutic intervention in order to improve the following deficits and impairments:           Visit Diagnosis: Hemiplegia affecting right dominant side, unspecified etiology, unspecified hemiplegia type ([place])  Apraxia  Visuospatial deficit  Other symptoms and signs involving the nervous system  Unsteadiness on feet  Muscle weakness (generalized)    Problem List Patient Active Problem List   Diagnosis Date Noted  . Goals of care, counseling/discussion 12/16/2018  . Palliative care by specialist   . 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 Spastic hemiparesis ([place])   . Cerebral edema (HCC)   . Intracranial tumor ([place])   . Steroid-induced hyperglycemia   . Spastic  hemiplegia affecting  nondominant side ([place])   . Hyponatremia   . Transaminitis   . Leucocytosis   . 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 Dysphagia 10/21/2018  . 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 Brain mass   . ICH (intracerebral hemorrhage) ([place]) 10/13/2018    Mariah Milling, OTR/L 03/18/2019, 4:53 PM  [facility] [address] [place], [place], [address] Phone: [phone]   Fax:  [phone]  Name: QUAMAINE KIRCHBERG MRN: [id number] Date of Birth: [birth year]

## 2019-03-18 NOTE — Therapy (Signed)
[facility] [address] [place], [place], [address] Phone: [phone]   Fax:  [phone]  Speech Language Pathology Treatment  Patient Details  Name: Gregory Moreno MRN: [id number] Date of Birth: 11/15/1995 Referring Provider (SLP): Alger Simons, MD   Encounter Date: 03/18/2019  End of Session - 03/18/19 2241    Visit Number  25    Number of Visits  41    Date for SLP Re-Evaluation  05/25/19    SLP Start Time  2924    SLP Stop Time   1615    SLP Time Calculation (min)  41 min    Activity Tolerance  Patient tolerated treatment well       History reviewed. No pertinent past medical history.  Past Surgical History:  Procedure Laterality Date  . CRANIOTOMY Left 10/13/2018   Procedure: LEFT CRANIOTOMY FOR TUMOR RESECTION;  Surgeon: Judith Part, MD;  Location: [facility];  Service: Neurosurgery;  Laterality: Left;    There were no vitals filed for this visit.  Subjective Assessment - 03/18/19 2227    Subjective  "I know it I just can't think of the numbers (word)."    Patient is accompained by:  Family member   mom   Currently in Pain?  No/denies            ADULT SLP TREATMENT - 03/18/19 2229      General Information   Behavior/Cognition  Cooperative;Requires cueing;Alert;Decreased sustained attention      Treatment Provided   Treatment provided  Cognitive-Linquistic      Cognitive-Linquistic Treatment   Treatment focused on  Apraxia;Cognition;Patient/family/caregiver education    Skilled Treatment  Discussed with mother and pt ST plan - SLP hopes to see measurable progress in the next two weeks in order to cont for four more weeks. If not, reduce to once/week with home program and check-ins until pt sees measurable success then bump back up to x2/week. Today, SLP assisted and cued pt usually with mod to mostly max A with VNEST tasks with "eat" "throw" and "drink" Pt could correctly pick out word  when SLP spelled it 75% of the time. Reiterated to mother that father and mother could do this with pt at home and SLP provided a list of intransitive verbs.       Assessment / Recommendations / Plan   Plan  Continue with current plan of care      Progression Toward Goals   Progression toward goals  Progressing toward goals   severity      SLP Education - 03/18/19 2241    Education Details  home tasks - VNEST    Person(s) Educated  Patient;Parent(s)    Methods  Explanation;Demonstration    Comprehension  Verbalized understanding;Need further instruction       SLP Short Term Goals - 03/18/19 2245      SLP SHORT TERM GOAL #1   Title  pt will complete aphasia assessment of auditory comprehension and verbal expression within 1-2 sessions    Status  Achieved      SLP SHORT TERM GOAL #2   Title  pt will demo understanding of more complex commands (spoken) with 80% and occasional min cues over three sessions    Time  4    Period  Weeks    Status  On-going      SLP SHORT TERM GOAL #3   Title  pt will answer pertinent questions with at least 3 words  Status  Not Met      SLP SHORT TERM GOAL #4   Title  pt will demo sustained/selective attention necessary to complete efficacious speech therapy in 6 sessions    Time  4    Period  Weeks   or 8 sessions   Status  Revised       SLP Long Term Goals - 03/18/19 2247      SLP LONG TERM GOAL #1   Title  pt will demo understanding of commands (spoken) with 85% and rare min cues over three sessions    Baseline  02-06-19    Time  8    Period  Weeks   or 17 sessions, for all LTGs   Status  Not Met      SLP LONG TERM GOAL #2   Title  pt will answer questions with [facility] verbal responses 75% of the time, in 3 sessions    Baseline  01-27-19    Time  8    Period  Weeks    Status  Not Met      SLP LONG TERM GOAL #3   Title  pt will sustain loud /a/ with average mid 70s dB over 4 sessions    Time  5    Period  Weeks    Status   Deferred   due to focus on language     SLP LONG TERM GOAL #4   Title  pt will engage in simple conversation for 5 minutes with average loudness mid 60s dB over 3 sessions    Time  5    Period  Weeks    Status  Deferred   due to focus on language     SLP LONG TERM GOAL #5   Title  pt will generate 2 subjects and 3 objects for a given intransitive verb with occasional min-mod A    Time  8    Period  Weeks    Status  New       Plan - 03/18/19 2242    Clinical Impression Statement  Pt with mod Broca's aphasia, dysarthria/voice, and cognitive communication deficits. Pt cont to require consistent SLP cues for verbal communication, pt better with written cues. See "skilled intervention" for more details on today's session. Pt would cont to benefit from skilled ST focusing on primarily receptive and expressive language but also at some point in rehab process focus on speech loudness and cognitive communication skills. Pt cont to require skilled ST focusing primarily on language skills. SLP thinks pt may benefit from a therapy hiatus with ST following this current plan of care if pt does not show significant progress in next two weeks. Likely if this occurs, pt will decr to once/week to allow SLP to giude parents through a home-based program and then d/c from there after approx 4-6 weeks or d/c if still limited progress. Mother agrees with this plan.    Speech Therapy Frequency  2x / week    Duration  --   8 weeks or 41 total visits - see "clinicial impression" for more details   Treatment/Interventions  Environmental controls;Functional tasks;Compensatory techniques;Multimodal communcation approach;SLP instruction and feedback;Cueing hierarchy;Language facilitation;Cognitive reorganization;Internal/external aids;Patient/family education    Potential to Achieve Goals  Good    Potential Considerations  Co-morbidities;Ability to learn/carryover information;Severity of impairments;Other (comment)        Patient will benefit from skilled therapeutic intervention in order to improve the following deficits and impairments:   Aphasia - Plan: SLP  plan of care cert/re-cert  Dysarthria and anarthria - Plan: SLP plan of care cert/re-cert  Cognitive communication deficit - Plan: SLP plan of care cert/re-cert    Problem List Patient Active Problem List   Diagnosis Date Noted  . Goals of care, counseling/discussion 12/16/2018  . Palliative care by specialist   . 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 Spastic hemiparesis ([place])   . Cerebral edema (HCC)   . Intracranial tumor ([place])   . Steroid-induced hyperglycemia   . Spastic hemiplegia affecting nondominant side ([place])   . Hyponatremia   . Transaminitis   . Leucocytosis   . Right spastic hemiplegia ([place]) 10/21/2018  . Dysphagia 10/21/2018  . 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 Brain mass   . ICH (intracerebral hemorrhage) ([place]) 10/13/2018    [facility] ,[place], [place]  03/18/2019, 10:51 PM  [facility] [address] [place], [place], [address] Phone: [phone]   Fax:  [phone]   Name: Gregory Moreno MRN: [id number] Date of Birth: 1995-04-11

## 2019-03-18 NOTE — Patient Instructions (Signed)
You could do this task with AJ at home as well! Three blanks on each side of the verb is a good place to start. Be sure he reads the sentences for each one after you are done.

## 2019-03-19 ENCOUNTER — Other Ambulatory Visit: Payer: Self-pay | Admitting: Internal Medicine

## 2019-03-19 ENCOUNTER — Other Ambulatory Visit: Payer: Self-pay | Admitting: Radiation Therapy

## 2019-03-19 DIAGNOSIS — C712 Malignant neoplasm of temporal lobe: Secondary | ICD-10-CM

## 2019-03-19 NOTE — Therapy (Signed)
[facility] [address] [place], [place], [address] Phone: [phone]   Fax:  [phone]  Physical Therapy Treatment  Patient Details  Name: Gregory Moreno MRN: [id number] Date of Birth: [birth year] Referring Provider (PT): Alger Simons, MD   Encounter Date: 03/18/2019  PT End of Session - 03/19/19 0824    Visit Number  26    Number of Visits  40    Date for PT Re-Evaluation  05/16/19    Authorization Type  [employer] - 2021, no VL, $40 copay per therapy per day    PT Start Time  1400    PT Stop Time  1445    PT Time Calculation (min)  45 min    Equipment Utilized During Treatment  Gait belt    Activity Tolerance  Patient tolerated treatment well    Behavior During Therapy  [facility] for tasks assessed/performed       History reviewed. No pertinent past medical history.  Past Surgical History:  Procedure Laterality Date  . CRANIOTOMY Left 10/13/2018   Procedure: LEFT CRANIOTOMY FOR TUMOR RESECTION;  Surgeon: Judith Part, MD;  Location: [facility];  Service: Neurosurgery;  Laterality: Left;    There were no vitals filed for this visit.  Subjective Assessment - 03/18/19 1408    Subjective  doing well, climbed up and down the stairs yesterday.    Limitations  Sitting;Walking;Standing    How long can you walk comfortably?  100' with hemiwalker    Patient Stated Goals  wants to improve his walking, get back to normal    Currently in Pain?  No/denies                       [facility] Adult PT Treatment/Exercise - 03/19/19 0828      Transfers   Comments  x2 reps stand pivot t/f from mat table - turning to face mat table to get on mat for tall kneeling activities, min guard and assist to prevent R genu recurvatum, pt only needing brief  UE support on chair for balance, however performs turns with no support      Ambulation/Gait   Ambulation/Gait  Yes    Ambulation/Gait Assistance  4: Min guard;4: Min assist     Ambulation/Gait Assistance Details  ambulated into session with small based quad cane, assist for weight shift towards RLE and to prevent R knee recurvatum, pt does well for a couple steps with cues for a "soft" knee, needs cues for wider BOS as pt still with tendency with narrow BOS    Ambulation Distance (Feet)  120 Feet   x1, 2 x 70'    Assistive device  Small based quad cane   R AFO   Gait Pattern  Decreased stance time - right;Decreased step length - left;Decreased weight shift to right;Decreased hip/knee flexion - right;Right genu recurvatum;Trunk flexed;Narrow base of support;Poor foot clearance - right;Step-through pattern;Decreased dorsiflexion - right    Ambulation Surface  Level;Indoor    Stairs  Yes    Stairs Assistance  4: Min guard;4: Min assist    Stairs Assistance Details (indicate cue type and reason)  pt able to recall proper sequencing for ascending/descending stairs, needs cueing for ascending stairs for shifting weight to R and activating RLE before stepping LLE onto step. min A to  help clear RLE onto step due to pt's shoe getting caught in the lip of the step secondary to RLE weakness    Stair  Management Technique  One rail Left;Step to pattern;Forwards    Number of Stairs  8    Height of Stairs  6      Neuro Re-ed    Neuro Re-ed Details   Got into tall kneeling position on blue mat table - blue kaye bench placed for LUE support to help assist pt to get onto table, needed min A and cues to shift towards R and activate R quad before bending LLE to place on mat table, assist from therapist for R knee flexion to bring R leg onto mat, had pt crawl forward in tall kneeling position as therapist moving kaye bench anteriorly, needing assist for RLE at times. In tall kneeling position had LUE support on kaye bench as well as medicine ball under RLE for weight bearing with therapist providing hand over hand assist for weight bearing, 1 x 10 reps mini squats (to pt's tolerated range) -  cues for tall posture and glute activation at end range, 1 x 10 reps bringing LLE out and in, min A for balance and weight shifting. Pt needing a break afterwards, pt leaning towards L to sit down and laying on L side - needing mod A from an additional hand from the PTA to get back up to sitting. After seated rest break performed tall kneeling mini squats for an additional 1 x 10 reps       Exercises   Exercises  Other Exercises    Other Exercises   NuStep with BLE only at gear 1 for 5 minutes for strengthening, ROM, and NMR to decr tone prior to stair training, pt needing assistance at R knee to prevent from ABD                PT Short Term Goals - 03/17/19 2023      PT SHORT TERM GOAL #1   Title  Pt will ambulate at least 115' with supervision with small base quad cane in order to improve household mobility. ALL STGS DUE 04/14/19    Time  4    Period  Weeks    Status  New    Target Date  04/14/19      PT SHORT TERM GOAL #2   Title  Patient will undergo further assessment of gait speed with LRAD - goal to be written as appropriate.    Period  Weeks    Status  New      PT SHORT TERM GOAL #3   Title  Pt will perform at least 5 sit <> stands from mat table with proper technique with no cueing and mod I in order to improve functional transfers and demo improved LE strength.    Time  4    Period  Weeks    Status  New      PT SHORT TERM GOAL #4   Title  Pt will perform all bed mobility with supervision in order to decrease caregiver burden.    Time  4    Period  Weeks    Status  New      PT SHORT TERM GOAL #5   Title  Patient will perform at least 4 steps using single handrail and step to pattern with min guard in order to safely enter/exit the RV.    Time  4    Period  Weeks    Status  New        PT Long Term Goals - 03/17/19 2026  PT LONG TERM GOAL #1   Title  Patient and pt's family will be independent with final HEP designed to improve movement and strength in  RLE.  ALL LTGS DUE 05/12/19    Baseline  on-going    Time  8    Period  Weeks    Status  New    Target Date  05/12/19      PT LONG TERM GOAL #2   Title  Pt will ambulate at least 200' over level indoor and outdoor paved surfaces with supervision in order to improve functional mobility with small base quad cane vs. LRAD    Time  8    Period  Weeks    Status  New      PT LONG TERM GOAL #3   Title  Pt will perform at least 10 sit <> stands from mat table with proper technique with no cueing and mod I in order to improve functional transfers and demo improved LE strength.    Time  8    Period  Weeks    Status  On-going      PT LONG TERM GOAL #4   Title  Pt will perform all bed mobility from level mat table with mod I in order to decrease caregiver burden.    Time  12    Period  Weeks    Status  On-going      PT LONG TERM GOAL #5   Title  Patient will perform at least 4 steps using single handrail and step to pattern with supervision in order to safely enter/exit the RV.    Baseline  8 steps using single handrail and step to pattern with min A ascending/descending    Time  8    Period  Weeks    Status  Achieved      PT LONG TERM GOAL #6   Title  Pt will perform a curb with min guard in order to improve community mobility and independence.    Time  8    Period  Weeks    Status  New            Plan - 03/19/19 1743    Clinical Impression Statement  Pt able to tolerate tall kneeling activity today for BLE strengthening and weight shifting towards RLE with BUE support at bench and RUE weight bearing through ball - noticed increased in tone after increased reps for mini squats. Pt with improved stair negotiation today - able to recall proper sequencing, still needs min A when descending to clear RLE due to weakness and getting R foot caught on the lip of the stairs. Will continue to progress towards LTGs.    Personal Factors and Comorbidities  --   pt ungergoing chemo/radiation    Examination-Activity Limitations  Bed Mobility;Locomotion Level;Sit;Squat;Stairs;Stand;Transfers    Examination-Participation Restrictions  Community Activity;School;Driving    Stability/Clinical Decision Making  Evolving/Moderate complexity    Rehab Potential  Good    PT Frequency  2x / week    PT Duration  8 weeks    PT Treatment/Interventions  ADLs/Self Care Home Management;Therapeutic exercise;Therapeutic activities;Functional mobility training;Stair training;Gait training;DME Instruction;Balance training;Neuromuscular re-education;Patient/family education;Orthotic Fit/Training;Wheelchair mobility training;Energy conservation;Passive range of motion    PT Next Visit Plan  tall kneeling.sit <> stands with compliant surface under RLE. walking from waiting room, stair training. Weight shifting training - perched sitting on wedge or bolster edge of mat for increased LE extension activation, weight shift R - lift LLE, rockerboard -  seated and standing for postural control/trunk righting.  NMR for R weight shifting, step taps with LLE, bed mobility/core and trunk strengthening.    PT Home Exercise Plan  [id number]    Consulted and Agree with Plan of Care  Patient;Family member/caregiver    Family Member Consulted  dad, Gregory Moreno       Patient will benefit from skilled therapeutic intervention in order to improve the following deficits and impairments:  Abnormal gait, Decreased activity tolerance, Decreased balance, Decreased cognition, Decreased coordination, Decreased safety awareness, Decreased range of motion, Decreased mobility, Difficulty walking, Decreased strength, Decreased endurance, Impaired tone  Visit Diagnosis: Other symptoms and signs involving the nervous system  Unsteadiness on feet  Muscle weakness (generalized)  Hemiplegia of right dominant side due to noncerebrovascular etiology, unspecified hemiplegia type [facility])     Problem List Patient Active Problem List   Diagnosis Date  Noted  . Goals of care, counseling/discussion 12/16/2018  . Palliative care by specialist   . 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 Spastic hemiparesis ([place])   . Cerebral edema (H[facility])   . Intracranial tumor ([place])   . Steroid-induced hyperglycemia   . Spastic hemiplegia affecting nondominant side ([place])   . Hyponatremia   . Transaminitis   . Leucocytosis   . Right spastic hemiplegia ([place]) 10/21/2018  . Dysphagia 10/21/2018  . 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 Brain mass   . ICH (intracerebral hemorrhage) ([place]) 10/13/2018    Arliss Journey, PT, DPT  03/19/2019, 5:49 PM  [facility] [address] [place], [place], [address] Phone: [phone]   Fax:  [phone]  Name: Gregory Moreno MRN: [id number] Date of Birth: 1995/11/15

## 2019-03-23 ENCOUNTER — Ambulatory Visit: Payer: 59

## 2019-03-23 ENCOUNTER — Encounter: Payer: Self-pay | Admitting: Occupational Therapy

## 2019-03-23 ENCOUNTER — Other Ambulatory Visit: Payer: Self-pay

## 2019-03-23 ENCOUNTER — Ambulatory Visit: Payer: 59 | Admitting: Occupational Therapy

## 2019-03-23 ENCOUNTER — Ambulatory Visit: Payer: 59 | Admitting: Physical Therapy

## 2019-03-23 DIAGNOSIS — R4701 Aphasia: Secondary | ICD-10-CM

## 2019-03-23 DIAGNOSIS — R41842 Visuospatial deficit: Secondary | ICD-10-CM

## 2019-03-23 DIAGNOSIS — R471 Dysarthria and anarthria: Secondary | ICD-10-CM

## 2019-03-23 DIAGNOSIS — R2689 Other abnormalities of gait and mobility: Secondary | ICD-10-CM

## 2019-03-23 DIAGNOSIS — R482 Apraxia: Secondary | ICD-10-CM

## 2019-03-23 DIAGNOSIS — M6281 Muscle weakness (generalized): Secondary | ICD-10-CM

## 2019-03-23 DIAGNOSIS — R2681 Unsteadiness on feet: Secondary | ICD-10-CM

## 2019-03-23 DIAGNOSIS — R29818 Other symptoms and signs involving the nervous system: Secondary | ICD-10-CM

## 2019-03-23 DIAGNOSIS — G8191 Hemiplegia, unspecified affecting right dominant side: Secondary | ICD-10-CM | POA: Diagnosis not present

## 2019-03-23 DIAGNOSIS — R41841 Cognitive communication deficit: Secondary | ICD-10-CM

## 2019-03-23 NOTE — Therapy (Signed)
[facility] [address] [place], [place], [address] Phone: [phone]   Fax:  [phone]  Occupational Therapy Treatment  Patient Details  Name: Gregory Moreno MRN: [id number] Date of Birth: May 24, 1995 Referring Provider (OT): Marlowe Shores   Encounter Date: 03/23/2019  OT End of Session - 03/23/19 1842    Visit Number  27    Number of Visits  41    Date for OT Re-Evaluation  05/17/19    Authorization Type  No VL - No auth required    Authorization - Visit Number  0    Authorization - Number of Visits  0    OT Start Time  1400    OT Stop Time  1445    OT Time Calculation (min)  45 min    Activity Tolerance  Patient tolerated treatment well    Behavior During Therapy  [facility] for tasks assessed/performed       History reviewed. No pertinent past medical history.  Past Surgical History:  Procedure Laterality Date  . CRANIOTOMY Left 10/13/2018   Procedure: LEFT CRANIOTOMY FOR TUMOR RESECTION;  Surgeon: Judith Part, MD;  Location: [facility];  Service: Neurosurgery;  Laterality: Left;    There were no vitals filed for this visit.  Subjective Assessment - 03/23/19 1409    Subjective   This is day 4 (on new chemo) - feel a little sick                   OT Treatments/Exercises (OP) - 03/23/19 0001      Neurological Re-education Exercises   Other Exercises 1  Neuromuscular reeducation to address postural control from base of support.  Working on actively maintaining alignment in RLE - Patient still needs cueing and facilitation to maintain activity in this limb.  Patient with less tonal response in RUE with standing activities today.  Worked on de-rotation trunk and use of mirror for more upright and midline oriented standing activities.  Worked from static to dynamic conditions.  Patient able to maintain right hand on long pole (active assist) and begin to control forward and lateral low to mid reach  patterns.  Patient needs cues and facilitaition to not lead with body - but lead motion with hand.                 OT Short Term Goals - 03/16/19 1554      OT SHORT TERM GOAL #5   Title  Patient will dress lower body with mod assist      Additional Short Term Goals   Additional Short Term Goals  Yes      OT SHORT TERM GOAL #6   Title  Patient will don/doff underwear, pants, and left shoe with min assist    Time  4    Period  Weeks    Status  New    Target Date  04/15/19      OT SHORT TERM GOAL #7   Title  Patient will complete shower transfer with supervision    Time  4    Period  Weeks    Status  New      OT SHORT TERM GOAL #8   Title  Patient will bathe self in shower with supervision and cueing assistance    Time  4    Period  Weeks    Status  New        OT Long Term Goals - 03/16/19 1558  OT LONG TERM GOAL #1   Title  Patient will dress himself with no greater than set up assistance    Time  8    Period  Weeks    Target Date  05/15/19      OT LONG TERM GOAL #2   Title  Patient will shower with supervision    Time  12    Period  Weeks    Status  On-going   now short temr goal     OT LONG TERM GOAL #3   Title  Patient will cut food on plate with modified independence    Time  8    Period  Weeks    Status  On-going      OT LONG TERM GOAL #4   Title  Patient will demonstrate ability to retrieve a lightweight item (less than 2lb) from waist height or lower and transport 6-12 inches laterally with RUE    Time  8    Period  Weeks    Status  On-going      OT LONG TERM GOAL #5   Title  Patient will demonstrate RUE  mid level reach with minimal LUE support to hit a target at chest height directly in front of body    Time  8    Period  Weeks    Status  On-going            Plan - 03/23/19 1842    Clinical Impression Statement  Patient continues to show improved balance and functional mobility, continues to show sight improvement in  postural control in more challenging situations.    OT Frequency  2x / week    OT Duration  8 weeks    OT Treatment/Interventions  Self-care/ADL training;DME and/or AE instruction;Splinting;Balance training;Aquatic Therapy;Therapeutic activities;Therapeutic exercise;Cognitive remediation/compensation;Neuromuscular education;Functional Mobility Training;Visual/perceptual remediation/compensation;Electrical Stimulation;Manual Therapy;Patient/family education    Plan  Postural control, NMR RUE, discuss goals, forced functional use of BUE up on feet - Static to dynaic stand balance    Consulted and Agree with Plan of Care  Patient;Family member/caregiver    Family Member Consulted  dad       Patient will benefit from skilled therapeutic intervention in order to improve the following deficits and impairments:           Visit Diagnosis: Other symptoms and signs involving the nervous system  Hemiplegia of right dominant side due to noncerebrovascular etiology, unspecified hemiplegia type ([place])  Apraxia  Visuospatial deficit  Unsteadiness on feet  Muscle weakness (generalized)    Problem List Patient Active Problem List   Diagnosis Date Noted  . Goals of care, counseling/discussion 12/16/2018  . Palliative care by specialist   . 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 Spastic hemiparesis ([place])   . Cerebral edema (HCC)   . Intracranial tumor ([place])   . Steroid-induced hyperglycemia   . Spastic hemiplegia affecting nondominant side ([place])   . Hyponatremia   . Transaminitis   . Leucocytosis   . Right spastic hemiplegia ([place]) 10/21/2018  . Dysphagia 10/21/2018  . 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 Brain mass   . ICH (intracerebral hemorrhage) ([place]) 10/13/2018    Gregory Moreno, OTR/L 03/23/2019, 6:44 PM  [facility] [address] [place], [place], [address] Phone: [phone]   Fax:   [phone]  Name: Gregory Moreno MRN: [id number] Date of Birth: Feb 02, 1995

## 2019-03-23 NOTE — Therapy (Signed)
[facility] [address] [place], [place], [address] Phone: [phone]   Fax:  [phone]  Speech Language Pathology Treatment  Patient Details  Name: Gregory Moreno MRN: [id number] Date of Birth: 1995/07/23 Referring Provider (SLP): Alger Simons, MD   Encounter Date: 03/23/2019  End of Session - 03/23/19 1720    Visit Number  26    Number of Visits  41    Date for SLP Re-Evaluation  05/25/19    SLP Start Time  7412    SLP Stop Time   1615    SLP Time Calculation (min)  42 min    Activity Tolerance  Patient tolerated treatment well       No past medical history on file.  Past Surgical History:  Procedure Laterality Date  . CRANIOTOMY Left 10/13/2018   Procedure: LEFT CRANIOTOMY FOR TUMOR RESECTION;  Surgeon: Judith Part, MD;  Location: [facility];  Service: Neurosurgery;  Laterality: Left;    There were no vitals filed for this visit.  Subjective Assessment - 03/23/19 1540    Subjective  "Damn it, what is that called?"    Patient is accompained by:  Family member   father   Currently in Pain?  No/denies            ADULT SLP TREATMENT - 03/23/19 1716      General Information   Behavior/Cognition  Cooperative;Requires cueing;Alert;Decreased sustained attention      Treatment Provided   Treatment provided  Cognitive-Linquistic      Cognitive-Linquistic Treatment   Treatment focused on  Aphasia;Apraxia;Cognition;Patient/family/caregiver education    Skilled Treatment  pt attention appeared better today in light of use of technology for speech tasks - pt verbally named (confrontation) 50% - incr'd to 80% with max A. In sequencing 3-5 step picture sequences pt unaware of errors 60% of the time - once given "incorrect" as a response pt able to fix the error 80% of the time.       Assessment / Recommendations / Plan   Plan  Continue with current plan of care      Progression Toward Goals   Progression toward goals  Progressing toward goals         SLP Short Term Goals - 03/18/19 2245      SLP SHORT TERM GOAL #1   Title  pt will complete aphasia assessment of auditory comprehension and verbal expression within 1-2 sessions    Status  Achieved      SLP SHORT TERM GOAL #2   Title  pt will demo understanding of more complex commands (spoken) with 80% and occasional min cues over three sessions    Time  4    Period  Weeks    Status  On-going      SLP SHORT TERM GOAL #3   Title  pt will answer pertinent questions with at least 3 words    Status  Not Met      SLP SHORT TERM GOAL #4   Title  pt will demo sustained/selective attention necessary to complete efficacious speech therapy in 6 sessions    Time  4    Period  Weeks   or 8 sessions   Status  Revised       SLP Long Term Goals - 03/23/19 1721      SLP LONG TERM GOAL #1   Title  pt will demo understanding of commands (spoken) with 85% and rare min cues over three  sessions    Baseline  02-06-19    Time  7    Period  Weeks   or 17 sessions, for all LTGs   Status  Not Met      SLP LONG TERM GOAL #2   Title  pt will answer questions with [facility] verbal responses 75% of the time, in 3 sessions    Baseline  01-27-19    Time  7    Period  Weeks    Status  Not Met      SLP LONG TERM GOAL #3   Title  pt will sustain loud /a/ with average mid 70s dB over 4 sessions    Time  5    Period  Weeks    Status  Deferred   due to focus on language     SLP LONG TERM GOAL #4   Title  pt will engage in simple conversation for 5 minutes with average loudness mid 60s dB over 3 sessions    Time  5    Period  Weeks    Status  Deferred   due to focus on language     SLP LONG TERM GOAL #5   Title  pt will generate 2 subjects and 3 objects for a given intransitive verb with occasional min-mod A    Time  7    Period  Weeks    Status  On-going       Plan - 03/23/19 1720    Clinical Impression Statement  Pt with mod  Broca's aphasia, dysarthria/voice, and cognitive communication deficits. Pt cont to require consistent SLP cues for verbal communication, pt better with written cues. See "skilled intervention" for more details on today's session. Pt would cont to benefit from skilled ST focusing on primarily receptive and expressive language but also at some point in rehab process focus on speech loudness and cognitive communication skills. Pt cont to require skilled ST focusing primarily on language skills. SLP thinks pt may benefit from a therapy hiatus with ST following this current plan of care if pt does not show significant progress in next two weeks. Likely if this occurs, pt will decr to once/week to allow SLP to giude parents through a home-based program and then d/c from there after approx 4-6 weeks or d/c if still limited progress. Mother agrees with this plan.    Speech Therapy Frequency  2x / week    Duration  --   8 weeks or 41 total visits - see "clinicial impression" for more details   Treatment/Interventions  Environmental controls;Functional tasks;Compensatory techniques;Multimodal communcation approach;SLP instruction and feedback;Cueing hierarchy;Language facilitation;Cognitive reorganization;Internal/external aids;Patient/family education    Potential to Achieve Goals  Good    Potential Considerations  Co-morbidities;Ability to learn/carryover information;Severity of impairments;Other (comment)       Patient will benefit from skilled therapeutic intervention in order to improve the following deficits and impairments:   Aphasia  Dysarthria and anarthria  Cognitive communication deficit    Problem List Patient Active Problem List   Diagnosis Date Noted  . Goals of care, counseling/discussion 12/16/2018  . Palliative care by specialist   . 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 Spastic hemiparesis ([place])   . Cerebral edema (HCC)   . Intracranial tumor ([place])   .  Steroid-induced hyperglycemia   . Spastic hemiplegia affecting nondominant side ([place])   . Hyponatremia   . Transaminitis   . Leucocytosis   . Right spastic hemiplegia ([place]) 10/21/2018  . Dysphagia  10/21/2018  . 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 Brain mass   . ICH (intracerebral hemorrhage) ([place]) 10/13/2018    [facility] ,[place], [place]  03/23/2019, 5:21 PM  [facility] [address] [place], [place], [address] Phone: [phone]   Fax:  [phone]   Name: Gregory Moreno MRN: [id number] Date of Birth: 1995-07-07

## 2019-03-24 ENCOUNTER — Other Ambulatory Visit: Payer: Self-pay | Admitting: Physical Medicine and Rehabilitation

## 2019-03-24 ENCOUNTER — Other Ambulatory Visit: Payer: Self-pay

## 2019-03-24 ENCOUNTER — Encounter: Payer: 59 | Admitting: Occupational Therapy

## 2019-03-24 MED ORDER — ESCITALOPRAM OXALATE 5 MG PO TABS: ORAL_TABLET | ORAL | 1 refills | Status: DC

## 2019-03-24 NOTE — Therapy (Signed)
[facility] [address] [place], [place], [address] Phone: [phone]   Fax:  [phone]  Physical Therapy Treatment  Patient Details  Name: Gregory Moreno MRN: [id number] Date of Birth: 12-08-1995 Referring Provider (PT): Alger Simons, MD   Encounter Date: 03/23/2019  PT End of Session - 03/24/19 2126    Visit Number  27    Number of Visits  40    Date for PT Re-Evaluation  05/16/19    Authorization Type  [employer] - 2021, no VL, $40 copay per therapy per day    PT Start Time  1446    PT Stop Time  1530    PT Time Calculation (min)  44 min    Equipment Utilized During Treatment  Gait belt    Activity Tolerance  Patient tolerated treatment well    Behavior During Therapy  [facility] for tasks assessed/performed       No past medical history on file.  Past Surgical History:  Procedure Laterality Date  . CRANIOTOMY Left 10/13/2018   Procedure: LEFT CRANIOTOMY FOR TUMOR RESECTION;  Surgeon: Judith Part, MD;  Location: [facility];  Service: Neurosurgery;  Laterality: Left;    There were no vitals filed for this visit.  Subjective Assessment - 03/23/19 1450    Subjective  Did some stairs over the weekend - states that it went well. No falls. Feeling a little nauseous today.    Limitations  Sitting;Walking;Standing    How long can you walk comfortably?  100' with hemiwalker    Patient Stated Goals  wants to improve his walking, get back to normal    Currently in Pain?  No/denies           03/23/19 1450  Symptoms/Limitations  Subjective Did some stairs over the weekend - states that it went well. No falls. Feeling a little nauseous today.  Limitations Sitting;Walking;Standing  How long can you walk comfortably? 100' with hemiwalker  Patient Stated Goals wants to improve his walking, get back to normal  Pain Assessment  Currently in Pain? No/denies         03/25/19 0001  Transfers  Sit to Stand 5:  Supervision;4: Min guard;Without upper extremity assist  Sit to Stand Details Verbal cues for sequencing;Verbal cues for technique;Tactile cues for initiation  Sit to Stand Details (indicate cue type and reason) from mat table   Stand to Sit 5: Supervision  Stand to Sit Details (indicate cue type and reason) Tactile cues for weight shifting;Tactile cues for initiation;Visual cues for safe use of DME/AE;Tactile cues for weight beaing;Verbal cues for sequencing  Comments from mat table 1 x 5 reps with red theraband around distal thighs for tactile cueing to keep knees apart during sit <> stands as pt with incr hip ADD/IR, pt able to demo understanding, for incr quad/extensor activation had pt sitting on bolster at edge of mat table, 2 x 8 reps sit <> stands with red theraband around distal thighs w/ verbal cues to keep knees apart, working on immediate R quad activation in standing, working on faster speed for coming up to stand  Ambulation/Gait  Ambulation/Gait Yes  Ambulation/Gait Assistance 4: Min guard;4: Min assist  Ambulation/Gait Assistance Details pt wearing R dance sock on R shoe today, pt able to demo improved RLE foot clearance and decr ankle rotation during gait, min A for weight shifting and to block R genu recurvatum, verbal cues for a "soft" knee and wider BOS   Ambulation Distance (  Feet) 115 Feet (x2)  Assistive device Small based quad cane (R AFO)  Gait Pattern Decreased stance time - right;Decreased step length - left;Decreased weight shift to right;Decreased hip/knee flexion - right;Right genu recurvatum;Trunk flexed;Narrow base of support;Poor foot clearance - right;Step-through pattern;Decreased dorsiflexion - right  Ambulation Surface Level;Indoor  Exercises  Exercises Other Exercises  Other Exercises  Standing at edge of mat side steps with single LUE on chair, 1 x 10 reps side stepping to L with cues for soft knee on R, then side steps towards L with red band around distal  thigh for proprioception, R hip ABD strengthening, weight bearing 1 x 10 reps, therapist providing assist at pelvis to prevent R pelvis retraction             03/25/19 0943  Balance Exercises: Standing  Rockerboard Lateral;EO;UE support;Limitations  Rockerboard Limitations 15 reps, min A from therapist for weight shift, pt needing intermittent UE support on // bars especially when shifting to R, needing manul assist and verbal cues to keep a soft knee with RLE   Other Standing Exercises In // bars: weight shifting towards RLE, verbal and tactile cues to make RLE work tapping LLE to 4" block, multiple reps with single UE support, attempted with no UE support, however pt too fearful and demonstrating increased lean to R, trialed to 2" block with pt able to perform 1 rep of tapping LLE with min A for balance, performed 5 reps with single UE support and placing LLE on step and then briefly letting go with LLE for SLS on R, cues for quad/glute activation and tall posture.                 PT Short Term Goals - 03/17/19 2023      PT SHORT TERM GOAL #1   Title  Pt will ambulate at least 115' with supervision with small base quad cane in order to improve household mobility. ALL STGS DUE 04/14/19    Time  4    Period  Weeks    Status  New    Target Date  04/14/19      PT SHORT TERM GOAL #2   Title  Patient will undergo further assessment of gait speed with LRAD - goal to be written as appropriate.    Period  Weeks    Status  New      PT SHORT TERM GOAL #3   Title  Pt will perform at least 5 sit <> stands from mat table with proper technique with no cueing and mod I in order to improve functional transfers and demo improved LE strength.    Time  4    Period  Weeks    Status  New      PT SHORT TERM GOAL #4   Title  Pt will perform all bed mobility with supervision in order to decrease caregiver burden.    Time  4    Period  Weeks    Status  New      PT SHORT TERM GOAL #5    Title  Patient will perform at least 4 steps using single handrail and step to pattern with min guard in order to safely enter/exit the RV.    Time  4    Period  Weeks    Status  New        PT Long Term Goals - 03/17/19 2026      PT LONG TERM GOAL #1   Title  Patient and pt's family will be independent with final HEP designed to improve movement and strength in RLE.  ALL LTGS DUE 05/12/19    Baseline  on-going    Time  8    Period  Weeks    Status  New    Target Date  05/12/19      PT LONG TERM GOAL #2   Title  Pt will ambulate at least 200' over level indoor and outdoor paved surfaces with supervision in order to improve functional mobility with small base quad cane vs. LRAD    Time  8    Period  Weeks    Status  New      PT LONG TERM GOAL #3   Title  Pt will perform at least 10 sit <> stands from mat table with proper technique with no cueing and mod I in order to improve functional transfers and demo improved LE strength.    Time  8    Period  Weeks    Status  On-going      PT LONG TERM GOAL #4   Title  Pt will perform all bed mobility from level mat table with mod I in order to decrease caregiver burden.    Time  12    Period  Weeks    Status  On-going      PT LONG TERM GOAL #5   Title  Patient will perform at least 4 steps using single handrail and step to pattern with supervision in order to safely enter/exit the RV.    Baseline  8 steps using single handrail and step to pattern with min A ascending/descending    Time  8    Period  Weeks    Status  Achieved      PT LONG TERM GOAL #6   Title  Pt will perform a curb with min guard in order to improve community mobility and independence.    Time  8    Period  Weeks    Status  New            Plan - 03/24/19 2128    Clinical Impression Statement  focus of today's skilled session was LE strengthening, NMR for weight shifting towards RLE. Pt tolerated session well - needed a seated rest break due to incr nausea  after standing activities in // bars. Attempted progressing standing weight shifting/dynamic balance with no UE support in // bars with L step taps to 2" step, however pt still fearful and needs LUE support. Will continue to progress towards LTGs.    Personal Factors and Comorbidities  --   pt ungergoing chemo/radiation   Examination-Activity Limitations  Bed Mobility;Locomotion Level;Sit;Squat;Stairs;Stand;Transfers    Examination-Participation Restrictions  Community Activity;School;Driving    Stability/Clinical Decision Making  Evolving/Moderate complexity    Rehab Potential  Good    PT Frequency  2x / week    PT Duration  8 weeks    PT Treatment/Interventions  ADLs/Self Care Home Management;Therapeutic exercise;Therapeutic activities;Functional mobility training;Stair training;Gait training;DME Instruction;Balance training;Neuromuscular re-education;Patient/family education;Orthotic Fit/Training;Wheelchair mobility training;Energy conservation;Passive range of motion    PT Next Visit Plan  standing balance on foam, tall kneeling.sit <> stands with compliant surface under RLE. walking from waiting room, stair training. Weight shifting training - perched sitting on wedge or bolster edge of mat for increased LE extension activation, rockerboard -seated and standing for postural control/trunk righting.  NMR for R weight shifting, step taps with LLE    PT Home Exercise  Plan  [id number]    Consulted and Agree with Plan of Care  Patient;Family member/caregiver    Family Member Consulted  dad, Kishon       Patient will benefit from skilled therapeutic intervention in order to improve the following deficits and impairments:  Abnormal gait, Decreased activity tolerance, Decreased balance, Decreased cognition, Decreased coordination, Decreased safety awareness, Decreased range of motion, Decreased mobility, Difficulty walking, Decreased strength, Decreased endurance, Impaired tone  Visit Diagnosis: Other  symptoms and signs involving the nervous system  Hemiplegia of right dominant side due to noncerebrovascular etiology, unspecified hemiplegia type (HCC)  Unsteadiness on feet  Muscle weakness (generalized)  Other abnormalities of gait and mobility     Problem List Patient Active Problem List   Diagnosis Date Noted  . Goals of care, counseling/discussion 12/16/2018  . Palliative care by specialist   . 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 Spastic hemiparesis ([place])   . Cerebral edema (HCC)   . Intracranial tumor ([place])   . Steroid-induced hyperglycemia   . Spastic hemiplegia affecting nondominant side ([place])   . Hyponatremia   . Transaminitis   . Leucocytosis   . Right spastic hemiplegia ([place]) 10/21/2018  . Dysphagia 10/21/2018  . 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 Brain mass   . ICH (intracerebral hemorrhage) ([place]) 10/13/2018    Arliss Journey, PT, DPT  03/24/2019, 9:31 PM  [facility] [address] [place], [place], [address] Phone: [phone]   Fax:  [phone]  Name: MASE CASSANI MRN: [id number] Date of Birth: 1995/09/17

## 2019-03-25 ENCOUNTER — Ambulatory Visit: Payer: 59 | Admitting: Physical Therapy

## 2019-03-25 ENCOUNTER — Ambulatory Visit: Payer: 59 | Admitting: Occupational Therapy

## 2019-03-25 ENCOUNTER — Ambulatory Visit: Payer: 59

## 2019-03-27 ENCOUNTER — Other Ambulatory Visit: Payer: Self-pay | Admitting: Internal Medicine

## 2019-03-27 DIAGNOSIS — C712 Malignant neoplasm of temporal lobe: Secondary | ICD-10-CM

## 2019-03-31 ENCOUNTER — Ambulatory Visit: Payer: 59

## 2019-03-31 ENCOUNTER — Encounter: Payer: Self-pay | Admitting: Physical Therapy

## 2019-03-31 ENCOUNTER — Other Ambulatory Visit: Payer: Self-pay

## 2019-03-31 ENCOUNTER — Encounter: Payer: Self-pay | Admitting: Occupational Therapy

## 2019-03-31 ENCOUNTER — Ambulatory Visit: Payer: 59 | Admitting: Physical Therapy

## 2019-03-31 ENCOUNTER — Ambulatory Visit: Payer: 59 | Admitting: Occupational Therapy

## 2019-03-31 DIAGNOSIS — R2681 Unsteadiness on feet: Secondary | ICD-10-CM

## 2019-03-31 DIAGNOSIS — G8191 Hemiplegia, unspecified affecting right dominant side: Secondary | ICD-10-CM

## 2019-03-31 DIAGNOSIS — R2689 Other abnormalities of gait and mobility: Secondary | ICD-10-CM

## 2019-03-31 DIAGNOSIS — M6281 Muscle weakness (generalized): Secondary | ICD-10-CM

## 2019-03-31 DIAGNOSIS — R29818 Other symptoms and signs involving the nervous system: Secondary | ICD-10-CM

## 2019-03-31 DIAGNOSIS — R482 Apraxia: Secondary | ICD-10-CM

## 2019-03-31 DIAGNOSIS — R41841 Cognitive communication deficit: Secondary | ICD-10-CM

## 2019-03-31 DIAGNOSIS — R4701 Aphasia: Secondary | ICD-10-CM

## 2019-03-31 DIAGNOSIS — R471 Dysarthria and anarthria: Secondary | ICD-10-CM

## 2019-03-31 DIAGNOSIS — R41842 Visuospatial deficit: Secondary | ICD-10-CM

## 2019-03-31 NOTE — Therapy (Signed)
[facility] [address] [place], [place], [address] Phone: [phone]   Fax:  [phone]  Speech Language Pathology Treatment  Patient Details  Name: Gregory Moreno MRN: [id number] Date of Birth: 13-Feb-1995 Referring Provider (SLP): Alger Simons, MD   Encounter Date: 03/31/2019  End of Session - 03/31/19 1639    Visit Number  27    Number of Visits  41    Date for SLP Re-Evaluation  05/25/19    SLP Start Time  7654    SLP Stop Time   1615    SLP Time Calculation (min)  41 min    Activity Tolerance  Patient tolerated treatment well       History reviewed. No pertinent past medical history.  Past Surgical History:  Procedure Laterality Date  . CRANIOTOMY Left 10/13/2018   Procedure: LEFT CRANIOTOMY FOR TUMOR RESECTION;  Surgeon: Judith Part, MD;  Location: [facility];  Service: Neurosurgery;  Laterality: Left;    There were no vitals filed for this visit.  Subjective Assessment - 03/31/19 1548    Subjective  "Give me the water."    Patient is accompained by:  Family member   mom   Currently in Pain?  No/denies            ADULT SLP TREATMENT - 03/31/19 1559      General Information   Behavior/Cognition  Cooperative;Requires cueing;Alert;Decreased sustained attention      Treatment Provided   Treatment provided  Cognitive-Linquistic      Cognitive-Linquistic Treatment   Treatment focused on  Apraxia;Aphasia    Skilled Treatment  SLP engged pt in short spurts of simple conversation using written, auditory and visual cues. Pt with semantic and phonemic paraphasias (vs. verbal apraxic errors)  and despite SLP cueing could not utilize any compenastory measures except writing target words with min-mod cues.  SLP hightlighted to mother that it was a good idea to have pt write as a compensastory technique.       Assessment / Recommendations / Plan   Plan  Continue with current plan of care      Progression Toward Goals   Progression toward goals  Progressing toward goals       SLP Education - 03/31/19 1639    Education Details  good to have pt write out target word as an option for ocmpensation    Person(s) Educated  Patient;Parent(s)    Methods  Explanation;Demonstration    Comprehension  Verbalized understanding       SLP Short Term Goals - 03/18/19 2245      SLP SHORT TERM GOAL #1   Title  pt will complete aphasia assessment of auditory comprehension and verbal expression within 1-2 sessions    Status  Achieved      SLP SHORT TERM GOAL #2   Title  pt will demo understanding of more complex commands (spoken) with 80% and occasional min cues over three sessions    Time  4    Period  Weeks    Status  On-going      SLP SHORT TERM GOAL #3   Title  pt will answer pertinent questions with at least 3 words    Status  Not Met      SLP SHORT TERM GOAL #4   Title  pt will demo sustained/selective attention necessary to complete efficacious speech therapy in 6 sessions    Time  4    Period  Weeks  or 8 sessions   Status  Revised       SLP Long Term Goals - 03/31/19 1641      SLP LONG TERM GOAL #1   Title  pt will demo understanding of commands (spoken) with 85% and rare min cues over three sessions    Baseline  02-06-19    Time  6    Period  Weeks   or 17 sessions, for all LTGs   Status  On-going      SLP LONG TERM GOAL #2   Title  pt will answer questions with [facility] verbal responses 75% of the time, in 3 sessions    Baseline  01-27-19    Time  6    Period  Weeks    Status  On-going      SLP LONG TERM GOAL #3   Title  pt will sustain loud /a/ with average mid 70s dB over 4 sessions    Time  5    Period  Weeks    Status  Deferred   due to focus on language     SLP LONG TERM GOAL #4   Title  pt will engage in simple conversation for 5 minutes with average loudness mid 60s dB over 3 sessions    Time  5    Period  Weeks    Status  Deferred   due to focus  on language     SLP LONG TERM GOAL #5   Title  pt will generate 2 subjects and 3 objects for a given intransitive verb with occasional min-mod A    Time  6    Period  Weeks    Status  On-going       Plan - 03/31/19 1640    Clinical Impression Statement  Pt with mod Broca's aphasia, dysarthria/voice, and cognitive communication deficits. Pt cont to require consistent SLP cues for verbal communication, pt better with written cues. However today in short spurts with consistent mod cues and some flexibility with topic, SLP cues pt able to hold conversation with SLP. See "skilled intervention" for more details on today's session. Pt would cont to benefit from skilled ST focusing on primarily receptive and expressive language but also at some point in rehab process focus on speech loudness and cognitive communication skills. Pt cont to require skilled ST focusing primarily on language skills.    Speech Therapy Frequency  2x / week    Duration  --   8 weeks or 41 total visits - see "clinicial impression" for more details   Treatment/Interventions  Environmental controls;Functional tasks;Compensatory techniques;Multimodal communcation approach;SLP instruction and feedback;Cueing hierarchy;Language facilitation;Cognitive reorganization;Internal/external aids;Patient/family education    Potential to Achieve Goals  Good    Potential Considerations  Co-morbidities;Ability to learn/carryover information;Severity of impairments;Other (comment)       Patient will benefit from skilled therapeutic intervention in order to improve the following deficits and impairments:   Aphasia  Dysarthria and anarthria  Cognitive communication deficit    Problem List Patient Active Problem List   Diagnosis Date Noted  . Goals of care, counseling/discussion 12/16/2018  . Palliative care by specialist   . 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 Spastic hemiparesis ([place])   . Cerebral edema  (HCC)   . Intracranial tumor ([place])   . Steroid-induced hyperglycemia   . Spastic hemiplegia affecting nondominant side ([place])   . Hyponatremia   . Transaminitis   . Leucocytosis   . Right spastic hemiplegia ([place])  10/21/2018  . Dysphagia 10/21/2018  . 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 Brain mass   . ICH (intracerebral hemorrhage) ([place]) 10/13/2018    [facility] ,[place], [place]  03/31/2019, 4:42 PM  [facility] [address] [place], [place], [address] Phone: [phone]   Fax:  [phone]   Name: Gregory Moreno MRN: [id number] Date of Birth: 1995/12/28

## 2019-03-31 NOTE — Therapy (Signed)
[facility] [address] [place], [place], [address] Phone: [phone]   Fax:  [phone]  Occupational Therapy Treatment  Patient Details  Name: Gregory Moreno MRN: [id number] Date of Birth: 15-Jun-1995 Referring Provider (OT): Marlowe [place]   Encounter Date: 03/31/2019  OT End of Session - 03/31/19 1721    Visit Number  28    Number of Visits  41    Date for OT Re-Evaluation  05/17/19    Authorization Type  No VL - No auth required    OT Start Time  1620    OT Stop Time  1700    OT Time Calculation (min)  40 min    Activity Tolerance  Patient tolerated treatment well    Behavior During Therapy  [facility] for tasks assessed/performed       History reviewed. No pertinent past medical history.  Past Surgical History:  Procedure Laterality Date  . CRANIOTOMY Left 10/13/2018   Procedure: LEFT CRANIOTOMY FOR TUMOR RESECTION;  Surgeon: Judith Part, MD;  Location: [facility];  Service: Neurosurgery;  Laterality: Left;    There were no vitals filed for this visit.  Subjective Assessment - 03/31/19 1713    Subjective   That's funny!    Patient is accompanied by:  Family member    Currently in Pain?  No/denies    Pain Score  0-No pain                   OT Treatments/Exercises (OP) - 03/31/19 1715      ADLs   UB Dressing  Mom present today as we reviewed progress toward ADL goals.  Patient is becoming more independent with bathing and dressing.  Mom indicates that patient has been showering with only set up assistance, and doing much of his dressing now with littel assistance.      Toileting  Mom still helps with wiping after BM, as she is not sure that patient is capable to maintain level of cleanliness.  Discussed with patient and mom that overall goal is increased independence wherever feasible.      ADL Comments  Spoke with patient and mom about aquatic program, and both are excited for potential to  start at the beginning of April.  Will work to [employer] with AT therapist.      Neurological Re-education Exercises   Other Exercises 1  Neuromuscular reeducation to address active movement in partially open chain condition.  In standing with hand on long pole, working to train reach patterns in which arm moves separately from body.  Patient with tendency to over use trunk and neck extension, to compensate for lack of isolated shoulder flexion.  Working on forced use paradigm - to allow arm to move opposite body movement.  Arm moves - body reamins stable.  Worked in standing, in partial squat, and in sitting.               OT Education - 03/31/19 1721    Education Details  Discussed potential goals for aqautic program    Person(s) Educated  Patient;Parent(s)    Methods  Explanation    Comprehension  Need further instruction       OT Short Term Goals - 03/31/19 1625      OT SHORT TERM GOAL #6   Title  Patient will don/doff underwear, pants, and left shoe with min assist    Status  Achieved      OT SHORT TERM GOAL #  7   Title  Patient will complete shower transfer with supervision    Status  Achieved      OT SHORT TERM GOAL #8   Title  Patient will bathe self in shower with supervision and cueing assistance    Status  Achieved        OT Long Term Goals - 03/16/19 1558      OT LONG TERM GOAL #1   Title  Patient will dress himself with no greater than set up assistance    Time  8    Period  Weeks    Target Date  05/15/19      OT LONG TERM GOAL #2   Title  Patient will shower with supervision    Time  12    Period  Weeks    Status  On-going   now short temr goal     OT LONG TERM GOAL #3   Title  Patient will cut food on plate with modified independence    Time  8    Period  Weeks    Status  On-going      OT LONG TERM GOAL #4   Title  Patient will demonstrate ability to retrieve a lightweight item (less than 2lb) from waist height or lower and transport 6-12  inches laterally with RUE    Time  8    Period  Weeks    Status  On-going      OT LONG TERM GOAL #5   Title  Patient will demonstrate RUE  mid level reach with minimal LUE support to hit a target at chest height directly in front of body    Time  8    Period  Weeks    Status  On-going            Plan - 03/31/19 1722    Clinical Impression Statement  Patient missed one session last week due to nausea and vomitting following round of chemo.  Patient reports feeling better today, and fully able to participate.  Patient has met several short term goals, and parents are reporting greater participation and independence with ADL.    OT Frequency  2x / week    OT Duration  8 weeks    OT Treatment/Interventions  Self-care/ADL training;DME and/or AE instruction;Splinting;Balance training;Aquatic Therapy;Therapeutic activities;Therapeutic exercise;Cognitive remediation/compensation;Neuromuscular education;Functional Mobility Training;Visual/perceptual remediation/compensation;Electrical Stimulation;Manual Therapy;Patient/family education    Plan  Postural control, NMR RUE, forced functional use of BUE up on feet - Static to dynamic stand balance    Consulted and Agree with Plan of Care  Patient;Family member/caregiver    Family Member Consulted  mom       Patient will benefit from skilled therapeutic intervention in order to improve the following deficits and impairments:           Visit Diagnosis: Apraxia  Unsteadiness on feet  Hemiplegia affecting right dominant side, unspecified etiology, unspecified hemiplegia type ([place])  Visuospatial deficit  Muscle weakness (generalized)    Problem List Patient Active Problem List   Diagnosis Date Noted  . Goals of care, counseling/discussion 12/16/2018  . Palliative care by specialist   . Glioblastoma multiforme of temporal lobe ([place]) 11/11/2018  . Spastic hemiparesis ([place])   . Cerebral edema (HCC)   . Intracranial tumor ([place])   .  Steroid-induced hyperglycemia   . Spastic hemiplegia affecting nondominant side ([place])   . Hyponatremia   . Transaminitis   . Leucocytosis   . Right spastic hemiplegia ([facility]) 10/21/2018  .  Dysphagia 10/21/2018  . 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 Brain mass   . ICH (intracerebral hemorrhage) ([place]) 10/13/2018    Mariah Milling, OTR/L 03/31/2019, 5:25 PM  [facility] [address] [place], [place], [address] Phone: [phone]   Fax:  [phone]  Name: Gregory Moreno MRN: [id number] Date of Birth: 07/10/1995

## 2019-03-31 NOTE — Telephone Encounter (Signed)
Patient request refill

## 2019-03-31 NOTE — Therapy (Signed)
[facility] [address] [place], [place], [address] Phone: [phone]   Fax:  [phone]  Physical Therapy Treatment  Patient Details  Name: Gregory Moreno MRN: [id number] Date of Birth: 1995-05-30 Referring Provider (PT): Alger Simons, MD   Encounter Date: 03/31/2019  PT End of Session - 03/31/19 1539    Visit Number  28    Number of Visits  40    Date for PT Re-Evaluation  05/16/19    Authorization Type  [employer] - 2021, no VL, $40 copay per therapy per day    PT Start Time  1446    PT Stop Time  1533    PT Time Calculation (min)  47 min    Equipment Utilized During Treatment  Gait belt    Activity Tolerance  Patient tolerated treatment well    Behavior During Therapy  [facility] for tasks assessed/performed       History reviewed. No pertinent past medical history.  Past Surgical History:  Procedure Laterality Date  . CRANIOTOMY Left 10/13/2018   Procedure: LEFT CRANIOTOMY FOR TUMOR RESECTION;  Surgeon: Judith Part, MD;  Location: [facility];  Service: Neurosurgery;  Laterality: Left;    There were no vitals filed for this visit.  Subjective Assessment - 03/31/19 1451    Subjective  took a shower by himself - was sitting in a shower chair. Had an almost fall, but was caught by his parents. one was crossing a threshold. felt very nauseous last week. doing much better now.    Limitations  Sitting;Walking;Standing    How long can you walk comfortably?  100' with hemiwalker    Patient Stated Goals  wants to improve his walking, get back to normal    Currently in Pain?  No/denies                       [facility] Adult PT Treatment/Exercise - 03/31/19 0001      Transfers   Sit to Stand  5: Supervision;With upper extremity assist    Sit to Stand Details  Verbal cues for sequencing;Verbal cues for technique;Tactile cues for initiation    Sit to Stand Details (indicate cue type and reason)  from mat  table    Stand to Sit  5: Supervision    Stand to Sit Details (indicate cue type and reason)  Tactile cues for weight shifting;Tactile cues for initiation;Visual cues for safe use of DME/AE;Tactile cues for weight beaing;Verbal cues for sequencing      Ambulation/Gait   Ambulation/Gait  Yes    Ambulation/Gait Assistance  4: Min guard;4: Min assist    Ambulation/Gait Assistance Details  Assist for R genu recurvatum, cues for wider BOS and L quad activation in stance. During bouts of gait had 3 yardsticks in pt's path to practice stepping over threshholds with RLE, pt needing assist at times to help clear RLE due to poor foot clearance and R foot getting stuck. No LOB. Needing assist at times during gait for proper R foot placement and R knee flexion to prevent rotation of ankle in terminal stance.      Ambulation Distance (Feet)  115 Feet   x1, 80' x 1, 100' x 1   Assistive device  Small based quad cane   R AFO   Gait Pattern  Decreased stance time - right;Decreased step length - left;Decreased weight shift to right;Decreased hip/knee flexion - right;Right genu recurvatum;Trunk flexed;Narrow base of support;Poor foot clearance -  right;Step-through pattern;Decreased dorsiflexion - right    Ambulation Surface  Level;Indoor      Exercises   Exercises  Other Exercises    Other Exercises   R AFO and shoe taken off for the following: Supine with RLE off of mat table: 2 x 10 reps hip flexion back onto mat, with therapist assisting back down into hip extension and keeping RLE in flexion, needed intermittent rest break between due to fatigue. Supine with RLE onto red physioball, 1 x 10 reps hamstring curls with AAROM to pull ball in, verbal cues to kick out, therapist providing assist at R ankle to prevent clonus. Sidelying on L: with therapist supporting RLE 2 x 8 reps hip flexion (pt able to initiate) with therapist assisting into hip extension with R knee bent. Supine on mat table with RLE closer into body  for incr muscle activation/weight bearing, bridging with LLE lifting off mat table, 1 x 8 reps, pt with incr flexor synergy with RUE when lifting LLE off of mat.           Balance Exercises - 03/31/19 1547      Balance Exercises: Standing   Standing Eyes Closed  Narrow base of support (BOS);Foam/compliant surface;Limitations    Standing Eyes Closed Limitations  wider BOS progressing to more narrow BOS with eyes closed, close min guard for balance, verbal and tactile cues for L quad activation, needs assist for RLE foot clearance onto foam    Rockerboard  Anterior/posterior;EO;Limitations    Rockerboard Limitations  weight shifting A/P, multiple reps progressing to holding board steady and maintaining balance with therapist providing manual perturbations to trunk/pelvis, intermittent UE support     Other Standing Exercises  with single UE support in bars: focusing on weight shifting towards the L and stepping RLE over yardstick and back to midline 1 x 10 reps, visual color target on floor for cue on foot placement, needed assistance with coming back to midline and for incr knee flexion on R to clear RLE          PT Short Term Goals - 03/17/19 2023      PT SHORT TERM GOAL #1   Title  Pt will ambulate at least 115' with supervision with small base quad cane in order to improve household mobility. ALL STGS DUE 04/14/19    Time  4    Period  Weeks    Status  New    Target Date  04/14/19      PT SHORT TERM GOAL #2   Title  Patient will undergo further assessment of gait speed with LRAD - goal to be written as appropriate.    Period  Weeks    Status  New      PT SHORT TERM GOAL #3   Title  Pt will perform at least 5 sit <> stands from mat table with proper technique with no cueing and mod I in order to improve functional transfers and demo improved LE strength.    Time  4    Period  Weeks    Status  New      PT SHORT TERM GOAL #4   Title  Pt will perform all bed mobility with  supervision in order to decrease caregiver burden.    Time  4    Period  Weeks    Status  New      PT SHORT TERM GOAL #5   Title  Patient will perform at least 4 steps using single  handrail and step to pattern with min guard in order to safely enter/exit the RV.    Time  4    Period  Weeks    Status  New        PT Long Term Goals - 03/17/19 2026      PT LONG TERM GOAL #1   Title  Patient and pt's family will be independent with final HEP designed to improve movement and strength in RLE.  ALL LTGS DUE 05/12/19    Baseline  on-going    Time  8    Period  Weeks    Status  New    Target Date  05/12/19      PT LONG TERM GOAL #2   Title  Pt will ambulate at least 200' over level indoor and outdoor paved surfaces with supervision in order to improve functional mobility with small base quad cane vs. LRAD    Time  8    Period  Weeks    Status  New      PT LONG TERM GOAL #3   Title  Pt will perform at least 10 sit <> stands from mat table with proper technique with no cueing and mod I in order to improve functional transfers and demo improved LE strength.    Time  8    Period  Weeks    Status  On-going      PT LONG TERM GOAL #4   Title  Pt will perform all bed mobility from level mat table with mod I in order to decrease caregiver burden.    Time  12    Period  Weeks    Status  On-going      PT LONG TERM GOAL #5   Title  Patient will perform at least 4 steps using single handrail and step to pattern with supervision in order to safely enter/exit the RV.    Baseline  8 steps using single handrail and step to pattern with min A ascending/descending    Time  8    Period  Weeks    Status  Achieved      PT LONG TERM GOAL #6   Title  Pt will perform a curb with min guard in order to improve community mobility and independence.    Time  8    Period  Weeks    Status  New            Plan - 03/31/19 1540    Clinical Impression Statement  Focus of today's skilled session was  RLE strengthening, gait training, NMR for weight shifting and balance reactions on compliant surfaces. Pt with decreased RLE foot clearance when stepping over small thresholds, needing assist at times for proper foot clearance. Pt tolerated session well - fatigued easily with R hip flexor strengthening. Able to progress balance on compliant surfaces today with manual perturbations to pt's trunk with pt only needing intermittent UE support. Will continue to progress towards LTGs.    Personal Factors and Comorbidities  --   pt ungergoing chemo/radiation   Examination-Activity Limitations  Bed Mobility;Locomotion Level;Sit;Squat;Stairs;Stand;Transfers    Examination-Participation Restrictions  Community Activity;School;Driving    Stability/Clinical Decision Making  Evolving/Moderate complexity    Rehab Potential  Good    PT Frequency  2x / week    PT Duration  8 weeks    PT Treatment/Interventions  ADLs/Self Care Home Management;Therapeutic exercise;Therapeutic activities;Functional mobility training;Stair training;Gait training;DME Instruction;Balance training;Neuromuscular re-education;Patient/family education;Orthotic Fit/Training;Wheelchair mobility training;Energy conservation;Passive range  of motion    PT Next Visit Plan  standing balance on foam, tall kneeling. gait outdoors stair training. Weight shifting training - perched sitting on wedge or bolster edge of mat for increased LE extension activation, rockerboard -seated and standing for postural control/trunk righting.  NMR for R weight shifting, step taps with LLE    PT Home Exercise Plan  [id number]    Consulted and Agree with Plan of Care  Patient;Family member/caregiver    Family Member Consulted  dad, Marvin       Patient will benefit from skilled therapeutic intervention in order to improve the following deficits and impairments:  Abnormal gait, Decreased activity tolerance, Decreased balance, Decreased cognition, Decreased coordination,  Decreased safety awareness, Decreased range of motion, Decreased mobility, Difficulty walking, Decreased strength, Decreased endurance, Impaired tone  Visit Diagnosis: Other symptoms and signs involving the nervous system  Unsteadiness on feet  Muscle weakness (generalized)  Other abnormalities of gait and mobility  Hemiplegia affecting right dominant side, unspecified etiology, unspecified hemiplegia type [facility])     Problem List Patient Active Problem List   Diagnosis Date Noted  . Goals of care, counseling/discussion 12/16/2018  . Palliative care by specialist   . Glioblastoma multiforme of temporal lobe ([place]) 11/11/2018  . Spastic hemiparesis ([place])   . Cerebral edema (H[facility])   . Intracranial tumor ([place])   . Steroid-induced hyperglycemia   . Spastic hemiplegia affecting nondominant side ([place])   . Hyponatremia   . Transaminitis   . Leucocytosis   . Right spastic hemiplegia ([place]) 10/21/2018  . Dysphagia 10/21/2018  . 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 Brain mass   . ICH (intracerebral hemorrhage) ([place]) 10/13/2018    Arliss Journey, PT, DPT  03/31/2019, 4:08 PM  [facility] [address] [place], [place], [address] Phone: [phone]   Fax:  [phone]  Name: Gregory Moreno MRN: [id number] Date of Birth: Jul 21, 1995

## 2019-04-07 ENCOUNTER — Ambulatory Visit: Payer: 59

## 2019-04-07 ENCOUNTER — Encounter: Payer: Self-pay | Admitting: Occupational Therapy

## 2019-04-07 ENCOUNTER — Ambulatory Visit: Payer: 59 | Admitting: Occupational Therapy

## 2019-04-07 ENCOUNTER — Other Ambulatory Visit: Payer: Self-pay

## 2019-04-07 ENCOUNTER — Ambulatory Visit: Payer: 59 | Admitting: Physical Therapy

## 2019-04-07 VITALS — BP 131/83 | HR 80

## 2019-04-07 DIAGNOSIS — G8191 Hemiplegia, unspecified affecting right dominant side: Secondary | ICD-10-CM | POA: Diagnosis not present

## 2019-04-07 DIAGNOSIS — R2681 Unsteadiness on feet: Secondary | ICD-10-CM

## 2019-04-07 DIAGNOSIS — M6281 Muscle weakness (generalized): Secondary | ICD-10-CM

## 2019-04-07 DIAGNOSIS — R2689 Other abnormalities of gait and mobility: Secondary | ICD-10-CM

## 2019-04-07 DIAGNOSIS — R41842 Visuospatial deficit: Secondary | ICD-10-CM

## 2019-04-07 DIAGNOSIS — R4701 Aphasia: Secondary | ICD-10-CM

## 2019-04-07 DIAGNOSIS — R41841 Cognitive communication deficit: Secondary | ICD-10-CM

## 2019-04-07 DIAGNOSIS — R29818 Other symptoms and signs involving the nervous system: Secondary | ICD-10-CM

## 2019-04-07 DIAGNOSIS — R482 Apraxia: Secondary | ICD-10-CM

## 2019-04-07 DIAGNOSIS — R471 Dysarthria and anarthria: Secondary | ICD-10-CM

## 2019-04-07 NOTE — Patient Instructions (Signed)
Use a table tent to assist Gregory Moreno in requesting his evening meds. This may take two weeks of prompting before anything is seen spontaneously.

## 2019-04-07 NOTE — Therapy (Signed)
[facility] [address] [place], [place], [address] Phone: [phone]   Fax:  [phone]  Speech Language Pathology Treatment  Patient Details  Name: Gregory Moreno MRN: [id number] Date of Birth: [birth year] Referring Provider (SLP): Gregory Simons, MD   Encounter Date: 04/07/2019  End of Session - 04/07/19 1731    Visit Number  28    Number of Visits  41    Date for SLP Re-Evaluation  05/25/19    SLP Start Time  1408    SLP Stop Time   1447    SLP Time Calculation (min)  39 min    Activity Tolerance  Patient tolerated treatment well       No past medical history on file.  Past Surgical History:  Procedure Laterality Date  . CRANIOTOMY Left 10/13/2018   Procedure: LEFT CRANIOTOMY FOR TUMOR RESECTION;  Surgeon: Judith Part, MD;  Location: [facility];  Service: Neurosurgery;  Laterality: Left;    There were no vitals filed for this visit.  Subjective Assessment - 04/07/19 1409    Subjective  "She does occupational safety" (occupational therapy - pt unaware of error)    Patient is accompained by:  Family member   mother   Currently in Pain?  No/denies            ADULT SLP TREATMENT - 04/07/19 1722      General Information   Behavior/Cognition  Cooperative;Requires cueing;Alert;Decreased sustained attention      Treatment Provided   Treatment provided  Cognitive-Linquistic      Cognitive-Linquistic Treatment   Treatment focused on  Apraxia;Aphasia;Cognition    Skilled Treatment  (Speech tx-individual)Simple conversation with pt today re: therapy schedule. SLP used written cues consistently with pt with varied success. Pt often needed written and phonemic cues for success. Awareness of semantic errors minimal except with repeated SLP verbal faded to nonvervbal (facial) cues with the same 2 errors throughout session. Last time pt made error, he was aware (verbally acknowledged his error) but could  not change without SLP mod cues. COGNITION (12 minutes): pt wrote the time of his next therapy and could not tell how to ensure SLP he would recall to tell SLP when it was time to move to next tx. SLP provided max-total A. SLP talked about pt telling mom/dad time for his evening meds as he has these after dinner and is in the same place every night. SLP suggested a table tent with "meds" on it and told mother that pt would likely need cues to look at card and ask for meds for the first 1-2 weeks but then possibly would begin to spontaneously ask for meds.      Assessment / Recommendations / Plan   Plan  Continue with current plan of care      Progression Toward Goals   Progression toward goals  Progressing toward goals       SLP Education - 04/07/19 1731    Education Details  memory compensations, use table tent to assist AJ in requesting his own meds    Person(s) Educated  Patient;Parent(s)    Methods  Explanation;Demonstration    Comprehension  Verbalized understanding;Need further instruction       SLP Short Term Goals - 04/07/19 1732      SLP SHORT TERM GOAL #1   Title  pt will complete aphasia assessment of auditory comprehension and verbal expression within 1-2 sessions    Status  Achieved  SLP SHORT TERM GOAL #2   Title  pt will demo understanding of simple commands (spoken) with 70% and occasional min cues over three sessions    Time  3    Period  Weeks    Status  Revised      SLP SHORT TERM GOAL #3   Title  pt will answer pertinent questions with at least 3 words    Status  Not Met      SLP SHORT TERM GOAL #4   Title  pt will demo sustained/selective attention necessary to complete efficacious speech therapy in 6 sessions    Time  3    Period  Weeks   or 8 sessions   Status  On-going       SLP Long Term Goals - 04/07/19 1733      SLP LONG TERM GOAL #1   Title  pt will demo understanding of commands (spoken) with 85% and rare min cues over three sessions     Baseline  02-06-19    Time  5    Period  Weeks   or 17 sessions, for all LTGs   Status  On-going      SLP LONG TERM GOAL #2   Title  pt will answer questions with [facility] verbal responses 75% of the time, in 3 sessions    Baseline  01-27-19    Time  5    Period  Weeks    Status  On-going      SLP LONG TERM GOAL #3   Title  pt will sustain loud /a/ with average mid 70s dB over 4 sessions    Time  5    Period  Weeks    Status  Deferred   due to focus on language     SLP LONG TERM GOAL #4   Title  pt will engage in simple conversation for 5 minutes with average loudness mid 60s dB over 3 sessions    Time  5    Period  Weeks    Status  Deferred   due to focus on language     SLP LONG TERM GOAL #5   Title  pt will generate 2 subjects and 3 objects for a given intransitive verb with occasional min-mod A    Time  5    Period  Weeks    Status  On-going       Plan - 04/07/19 1731    Clinical Impression Statement  Pt with mod Broca's aphasia, dysarthria/voice, and cognitive communication deficits. Pt cont to require consistent SLP cues for verbal communication, pt better with written cues. However today in short spurts with consistent mod cues and some flexibility with topic, SLP cues pt able to hold conversation with SLP. See "skilled intervention" for more details on today's session. Pt would cont to benefit from skilled ST focusing on primarily receptive and expressive language but also at some point in rehab process focus on speech loudness and cognitive communication skills. Pt cont to require skilled ST focusing primarily on language skills.    Speech Therapy Frequency  2x / week    Duration  --   8 weeks or 41 total visits - see "clinicial impression" for more details   Treatment/Interventions  Environmental controls;Functional tasks;Compensatory techniques;Multimodal communcation approach;SLP instruction and feedback;Cueing hierarchy;Language facilitation;Cognitive  reorganization;Internal/external aids;Patient/family education    Potential to Achieve Goals  Good    Potential Considerations  Co-morbidities;Ability to learn/carryover information;Severity of impairments;Other (comment)  Patient will benefit from skilled therapeutic intervention in order to improve the following deficits and impairments:   Aphasia  Dysarthria and anarthria  Cognitive communication deficit    Problem List Patient Active Problem List   Diagnosis Date Noted  . Goals of care, counseling/discussion 12/16/2018  . Palliative care by specialist   . 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 Spastic hemiparesis ([place])   . Cerebral edema (HCC)   . Intracranial tumor ([place])   . Steroid-induced hyperglycemia   . Spastic hemiplegia affecting nondominant side ([place])   . Hyponatremia   . Transaminitis   . Leucocytosis   . Right spastic hemiplegia ([place]) 10/21/2018  . Dysphagia 10/21/2018  . 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 Brain mass   . ICH (intracerebral hemorrhage) ([place]) 10/13/2018    [facility] ,[place], [place]  04/07/2019, 5:33 PM  [facility] [address] [place], [place], [address] Phone: [phone]   Fax:  [phone]   Name: Gregory Moreno MRN: [id number] Date of Birth: [birth year]

## 2019-04-07 NOTE — Therapy (Signed)
[facility] [address] [place], [place], [address] Phone: [phone]   Fax:  [phone]  Occupational Therapy Treatment  Patient Details  Name: Gregory Moreno MRN: [id number] Date of Birth: [birth year] Referring Provider (OT): Marlowe [place]   Encounter Date: 04/07/2019  OT End of Session - 04/07/19 1652    Number of Visits  41    Date for OT Re-Evaluation  05/17/19    Authorization Type  No VL - No auth required    OT Start Time  1447    OT Stop Time  1530    OT Time Calculation (min)  43 min    Activity Tolerance  Patient tolerated treatment well    Behavior During Therapy  [facility] for tasks assessed/performed       History reviewed. No pertinent past medical history.  Past Surgical History:  Procedure Laterality Date  . CRANIOTOMY Left 10/13/2018   Procedure: LEFT CRANIOTOMY FOR TUMOR RESECTION;  Surgeon: Judith Part, MD;  Location: [facility];  Service: Neurosurgery;  Laterality: Left;    There were no vitals filed for this visit.  Subjective Assessment - 04/07/19 1646    Subjective   [place] melting pot!  When asked of his favorite restaurant.    Patient is accompanied by:  Family member    Currently in Pain?  No/denies    Pain Score  0-No pain                   OT Treatments/Exercises (OP) - 04/07/19 0001      ADLs   UB Dressing  Mom reports that AJ has been dressing himself over the past week.        Neurological Re-education Exercises   Other Exercises 1  Mom shared that patient fell leaving the bathroom this weekend.  He was bending over to pet the cat.  Worked today on integrating left and right limbs to transition from floor to seated position.  Patient able to tolerate kneeling, sidesitting, and transitioned self up to tall kneeling.  Patient unable to use left leg to power up to stand position - due to ineffective weight shift.  Patient able to use right LE slightly baraced against  surface, and left hand to transition up to standing.      Other Exercises 2  In sidelying to increase visual attention to right upper extremity, patient able to isolate forearm pro/supination.  Needed facilitation to lengthen lower arm throughout pronation.  Patient also able to isolate elbow flex and extension following facilitation.               OT Education - 04/07/19 1652    Education Details  Reviewed potentila schedule for aquatics program    Person(s) Educated  Patient;Parent(s)    Methods  Explanation    Comprehension  Verbalized understanding       OT Short Term Goals - 03/31/19 1625      OT SHORT TERM GOAL #6   Title  Patient will don/doff underwear, pants, and left shoe with min assist    Status  Achieved      OT SHORT TERM GOAL #7   Title  Patient will complete shower transfer with supervision    Status  Achieved      OT SHORT TERM GOAL #8   Title  Patient will bathe self in shower with supervision and cueing assistance    Status  Achieved        OT  Long Term Goals - 03/16/19 1558      OT LONG TERM GOAL #1   Title  Patient will dress himself with no greater than set up assistance    Time  8    Period  Weeks    Target Date  05/15/19      OT LONG TERM GOAL #2   Title  Patient will shower with supervision    Time  12    Period  Weeks    Status  On-going   now short temr goal     OT LONG TERM GOAL #3   Title  Patient will cut food on plate with modified independence    Time  8    Period  Weeks    Status  On-going      OT LONG TERM GOAL #4   Title  Patient will demonstrate ability to retrieve a lightweight item (less than 2lb) from waist height or lower and transport 6-12 inches laterally with RUE    Time  8    Period  Weeks    Status  On-going      OT LONG TERM GOAL #5   Title  Patient will demonstrate RUE  mid level reach with minimal LUE support to hit a target at chest height directly in front of body    Time  8    Period  Weeks    Status   On-going            Plan - 04/07/19 1652    Clinical Impression Statement  Patient continues to show improved participation and independence with basic self care activities.  Patient excited to start aquatic program next month.    OT Frequency  2x / week    OT Duration  8 weeks    OT Treatment/Interventions  Self-care/ADL training;DME and/or AE instruction;Splinting;Balance training;Aquatic Therapy;Therapeutic activities;Therapeutic exercise;Cognitive remediation/compensation;Neuromuscular education;Functional Mobility Training;Visual/perceptual remediation/compensation;Electrical Stimulation;Manual Therapy;Patient/family education    Plan  Postural control, NMR RUE, forced functional use of BUE up on feet - Static to dynamic stand balance, check goals, review aquatics instructions    Consulted and Agree with Plan of Care  Patient;Family member/caregiver    Family Member Consulted  mom       Patient will benefit from skilled therapeutic intervention in order to improve the following deficits and impairments:           Visit Diagnosis: Hemiplegia affecting right dominant side, unspecified etiology, unspecified hemiplegia type ([place])  Apraxia  Visuospatial deficit  Unsteadiness on feet  Muscle weakness (generalized)  Other symptoms and signs involving the nervous system    Problem List Patient Active Problem List   Diagnosis Date Noted  . Goals of care, counseling/discussion 12/16/2018  . Palliative care by specialist   . 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 Spastic hemiparesis ([place])   . Cerebral edema (HCC)   . Intracranial tumor ([place])   . Steroid-induced hyperglycemia   . Spastic hemiplegia affecting nondominant side ([place])   . Hyponatremia   . Transaminitis   . Leucocytosis   . Right spastic hemiplegia ([place]) 10/21/2018  . Dysphagia 10/21/2018  . 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 Brain mass   . ICH (intracerebral hemorrhage)  ([place]) 10/13/2018    Mariah Milling, OTR/L 04/07/2019, 4:55 PM  [facility] [address] [place], [place], [address] Phone: [phone]   Fax:  [phone]  Name: ABEDNEGO KLEIN MRN: [id number] Date of  Birth: [birth year]

## 2019-04-08 ENCOUNTER — Encounter: Payer: Self-pay | Admitting: Physical Medicine and Rehabilitation

## 2019-04-08 ENCOUNTER — Encounter: Payer: 59 | Attending: Physical Medicine and Rehabilitation | Admitting: Physical Medicine and Rehabilitation

## 2019-04-08 ENCOUNTER — Ambulatory Visit: Payer: 59 | Admitting: Physical Therapy

## 2019-04-08 ENCOUNTER — Other Ambulatory Visit: Payer: Self-pay

## 2019-04-08 ENCOUNTER — Ambulatory Visit: Payer: 59 | Admitting: Occupational Therapy

## 2019-04-08 ENCOUNTER — Encounter: Payer: Self-pay | Admitting: Occupational Therapy

## 2019-04-08 ENCOUNTER — Ambulatory Visit: Payer: 59

## 2019-04-08 VITALS — BP 110/73 | HR 94 | Temp 97.9°F | Ht 69.0 in | Wt 186.0 lb

## 2019-04-08 DIAGNOSIS — R471 Dysarthria and anarthria: Secondary | ICD-10-CM

## 2019-04-08 DIAGNOSIS — M6281 Muscle weakness (generalized): Secondary | ICD-10-CM

## 2019-04-08 DIAGNOSIS — R482 Apraxia: Secondary | ICD-10-CM

## 2019-04-08 DIAGNOSIS — C712 Malignant neoplasm of temporal lobe: Secondary | ICD-10-CM | POA: Insufficient documentation

## 2019-04-08 DIAGNOSIS — G939 Disorder of brain, unspecified: Secondary | ICD-10-CM

## 2019-04-08 DIAGNOSIS — G8110 Spastic hemiplegia affecting unspecified side: Secondary | ICD-10-CM

## 2019-04-08 DIAGNOSIS — G8191 Hemiplegia, unspecified affecting right dominant side: Secondary | ICD-10-CM

## 2019-04-08 DIAGNOSIS — R4701 Aphasia: Secondary | ICD-10-CM

## 2019-04-08 DIAGNOSIS — N3944 Nocturnal enuresis: Secondary | ICD-10-CM | POA: Insufficient documentation

## 2019-04-08 DIAGNOSIS — R41842 Visuospatial deficit: Secondary | ICD-10-CM

## 2019-04-08 DIAGNOSIS — R2681 Unsteadiness on feet: Secondary | ICD-10-CM

## 2019-04-08 DIAGNOSIS — R29818 Other symptoms and signs involving the nervous system: Secondary | ICD-10-CM

## 2019-04-08 DIAGNOSIS — R2689 Other abnormalities of gait and mobility: Secondary | ICD-10-CM

## 2019-04-08 DIAGNOSIS — R41841 Cognitive communication deficit: Secondary | ICD-10-CM

## 2019-04-08 NOTE — Therapy (Signed)
[facility] [address] [place], [place], [address] Phone: [phone]   Fax:  [phone]  Speech Language Pathology Treatment  Patient Details  Name: Gregory Moreno MRN: [id number] Date of Birth: 04/16/1995 Referring Provider (SLP): Alger Simons, MD   Encounter Date: 04/08/2019  End of Session - 04/08/19 1712    Visit Number  29    Number of Visits  41    Date for SLP Re-Evaluation  05/25/19    SLP Start Time  1449    SLP Stop Time   1530    SLP Time Calculation (min)  41 min    Activity Tolerance  Patient tolerated treatment well       History reviewed. No pertinent past medical history.  Past Surgical History:  Procedure Laterality Date  . CRANIOTOMY Left 10/13/2018   Procedure: LEFT CRANIOTOMY FOR TUMOR RESECTION;  Surgeon: Judith Part, MD;  Location: [facility];  Service: Neurosurgery;  Laterality: Left;    There were no vitals filed for this visit.  Subjective Assessment - 04/08/19 1456    Subjective  "Occupational safety" (pt, re: what therapy he came from) - without awareness    Patient is accompained by:  Family member   father   Currently in Pain?  No/denies            ADULT SLP TREATMENT - 04/08/19 1457      General Information   Behavior/Cognition  Cooperative;Requires cueing;Alert;Decreased sustained attention      Treatment Provided   Treatment provided  Cognitive-Linquistic      Cognitive-Linquistic Treatment   Treatment focused on  Aphasia;Apraxia    Skilled Treatment  SLP reviewed what pt did yesterday with SLP and pt req'd max A to recall talked about therapies. Pt cont to say "occuapational safety" for "occupational therapy", without awareness first 3 times until SLP made pt aware. Pt emergent awareness improved after that to 80% of the time with min SLP facial cues. Father with some questions today about prognosis and pt's language skills. SLP provided best answers  possible but also referred father to pt's surgeon or medical oncologist due to those MDs knowing more about the exact resected or exact treated brain anatomy. SLP told father pt was likely to see some success on and off for approx 12 months (approx September 2021) before it tapers off. Father stated that is what medical oncologist told him as well. SLP encouragd father and pt to work at home in pt's daily routines arouond the home with speech and language tasks - SLP thanked father for attending wiht son as this is best way to see how to shape cituations for good langauge and speech practice at home. Enocuraged father to set up table tent for pt to recall to ask parents for evening meds.       Assessment / Recommendations / Plan   Plan  Continue with current plan of care      Progression Toward Goals   Progression toward goals  Progressing toward goals       SLP Education - 04/08/19 1711    Education Details  approx 12 months on and off likely until progress tapers off, work with pt languge and speech throughout the day    Person(s) Educated  Patient;Parent(s)    Methods  Explanation    Comprehension  Verbalized understanding       SLP Short Term Goals - 04/08/19 1713      SLP SHORT TERM GOAL #  1   Title  pt will complete aphasia assessment of auditory comprehension and verbal expression within 1-2 sessions    Status  Achieved      SLP SHORT TERM GOAL #2   Title  pt will demo understanding of simple commands (spoken) with 70% and occasional min cues over three sessions    Time  3    Period  Weeks    Status  On-going      SLP SHORT TERM GOAL #3   Title  pt will answer pertinent questions with at least 3 words    Status  Not Met      SLP SHORT TERM GOAL #4   Title  pt will demo sustained/selective attention necessary to complete efficacious speech therapy in 6 sessions    Baseline  04-07-19    Time  3    Period  Weeks   or 8 sessions   Status  On-going       SLP Long Term  Goals - 04/08/19 1713      SLP LONG TERM GOAL #1   Title  pt will demo understanding of commands (spoken) with 85% and rare min cues over three sessions    Baseline  02-06-19    Time  5    Period  Weeks   or 17 sessions, for all LTGs   Status  On-going      SLP LONG TERM GOAL #2   Title  pt will answer questions with [facility] verbal responses 75% of the time, in 3 sessions    Baseline  01-27-19    Time  5    Period  Weeks    Status  On-going      SLP LONG TERM GOAL #3   Title  pt will sustain loud /a/ with average mid 70s dB over 4 sessions    Time  5    Period  Weeks    Status  Deferred   due to focus on language     SLP LONG TERM GOAL #4   Title  pt will engage in simple conversation for 5 minutes with average loudness mid 60s dB over 3 sessions    Time  5    Period  Weeks    Status  Deferred   due to focus on language     SLP LONG TERM GOAL #5   Title  pt will generate 2 subjects and 3 objects for a given intransitive verb with occasional min-mod A    Time  5    Period  Weeks    Status  On-going       Plan - 04/08/19 1712    Clinical Impression Statement  Pt with mod Broca's aphasia, dysarthria/voice, and cognitive communication deficits. Pt also exhibits s/sx verbal apraxia. Pt cont to require consistent SLP cues for verbal communication, pt better with written cues. However today in short spurts with consistent mod cues and some flexibility with topic, SLP cues pt able to hold conversation with SLP. See "skilled intervention" for more details on today's session. Pt would cont to benefit from skilled ST focusing on primarily receptive and expressive language but also at some point in rehab process focus on speech loudness and cognitive communication skills. Pt cont to require skilled ST focusing primarily on language skills.    Speech Therapy Frequency  2x / week    Duration  --   8 weeks or 41 total visits - see "clinicial impression" for more details  Treatment/Interventions  Environmental controls;Functional tasks;Compensatory techniques;Multimodal communcation approach;SLP instruction and feedback;Cueing hierarchy;Language facilitation;Cognitive reorganization;Internal/external aids;Patient/family education    Potential to Achieve Goals  Good    Potential Considerations  Co-morbidities;Ability to learn/carryover information;Severity of impairments;Other (comment)       Patient will benefit from skilled therapeutic intervention in order to improve the following deficits and impairments:   Aphasia  Dysarthria and anarthria  Cognitive communication deficit  Verbal apraxia    Problem List Patient Active Problem List   Diagnosis Date Noted  . Goals of care, counseling/discussion 12/16/2018  . Palliative care by specialist   . 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 Spastic hemiparesis ([place])   . Cerebral edema (HCC)   . Intracranial tumor ([place])   . Steroid-induced hyperglycemia   . Spastic hemiplegia affecting nondominant side ([place])   . Hyponatremia   . Transaminitis   . Leucocytosis   . Right spastic hemiplegia ([place]) 10/21/2018  . Dysphagia 10/21/2018  . 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 Brain mass   . ICH (intracerebral hemorrhage) ([place]) 10/13/2018    [facility] ,[place], [place]  04/08/2019, 5:15 PM  [facility] [address] [place], [place], [address] Phone: [phone]   Fax:  [phone]   Name: CORDARRELL SANE MRN: [id number] Date of Birth: 07/20/1995

## 2019-04-08 NOTE — Progress Notes (Signed)
Subjective:    Patient ID: Gregory Moreno, male    DOB: Sep 30, 1995, 24 y.o.   MRN: [id number]  HPI  Gregory Moreno presents for follow-up for impaired mobility and language following his glioblastoma.   He has bene doing very well. He goes to outpatient PT, OT, and SLP and has made great gains in all three domains. He finished his steroids in January and has had a 20 lb increase in weight from taking these. He has not lost the weight despite discontinuing them and this is somewhat bothersome to him. He finished his brain radiation in January without any side effects other than fatigue. He is due to start his next course in February. His father states that his mood has been great. He continues on the Lexapro 5mg . This is the only medication he is taking, besides Zofran for nausea from his chemotherapy. He enjoys eating and playing games on his phone. He loves to fly airplanes and his father has taken him flying twice. 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 He has since followed with opththalmology and was told he will likely not recover his right sided peripheral vision. He has attempted driving and this vision deficit has been difficult for him. He also has right sided spasticity that has been worsening. It does not cause him pain. He has a follow-up scheduled with oncology on Monday.   Pain Inventory Average Pain 0 Pain Right Now 0 My pain is na  In the last 24 hours, has pain interfered with the following? General activity 0 Relation with others 0 Enjoyment of life 0 What TIME of day is your pain at its worst? na Sleep (in general) Good  Pain is worse with: na Pain improves with: na Relief from Meds: na  Mobility walk with assistance use a cane ability to climb steps?  yes do you drive?  no  Function not employed: date last employed . I need assistance with the following:   dressing, bathing and meal prep  Neuro/Psych tremor  Prior Studies Any changes since last visit?  no  Physicians involved in your care Any changes since last visit?  no   No family history on file. Social History   Socioeconomic History  . Marital status: Single    Spouse name: Not on file  . Number of children: Not on file  . Years of education: Not on file  . Highest education level: Not on file  Occupational History  . Not on file  Tobacco Use  . Smoking status: Never Smoker  . Smokeless tobacco: Never Used  Substance and Sexual Activity  . Alcohol use: Not on file  . Drug use: Not on file  . Sexual activity: Not on file  Other Topics Concern  . Not on file  Social History Narrative  . Not on file   Social Determinants of Health   Financial Resource Strain:   . Difficulty of Paying Living Expenses:   Food Insecurity:   . Worried About Charity fundraiser in the Last Year:   . Arboriculturist in the Last Year:   Transportation Needs:   . Film/video editor (Medical):   Marland Kitchen Lack of Transportation (Non-Medical):   Physical Activity:   . Days of Exercise per Week:   . Minutes of Exercise per Session:   Stress:   . Feeling of Stress :  Social Connections:   . Frequency of Communication with Friends and Family:   . Frequency of Social Gatherings with Friends and Family:   . Attends Religious Services:   . Active Member of Clubs or Organizations:   . Attends Archivist Meetings:   Marland Kitchen Marital Status:    Past Surgical History:  Procedure Laterality Date  . CRANIOTOMY Left 10/13/2018   Procedure: LEFT CRANIOTOMY FOR TUMOR RESECTION;  Surgeon: Judith Part, MD;  Location: [facility];  Service: Neurosurgery;  Laterality: Left;   No past medical history on file. There were no vitals taken for this visit.  Opioid Risk Score:   Fall Risk Score:  `1  Depression screen PHQ 2/9  Depression screen PHQ 2/9 12/09/2018  Decreased Interest 0  Down,  Depressed, Hopeless 0  PHQ - 2 Score 0  Altered sleeping 0  Tired, decreased energy 0  Change in appetite 0  Feeling bad or failure about yourself  0  Trouble concentrating 0  Moving slowly or fidgety/restless 0  Suicidal thoughts 0  PHQ-9 Score 0  Some recent data might be hidden     Review of Systems  Constitutional: Negative.   HENT: Negative.   Eyes: Negative.   Respiratory: Negative.   Cardiovascular: Negative.   Gastrointestinal: Negative.   Endocrine: Negative.   Genitourinary: Negative.   Musculoskeletal: Positive for gait problem.  Skin: Negative.   Allergic/Immunologic: Negative.   Neurological: Positive for tremors.  Hematological: Negative.   Psychiatric/Behavioral: Negative.   All other systems reviewed and are negative.      Objective:   Physical Exam Gen: no distress, normal appearing HEENT: oral mucosa pink and moist, NCAT Cardio: Reg rate Chest: normal effort, normal rate of breathing Abd: soft, non-distended Ext: no edema Skin: intact Neuro:alert. Follows commands. Coordination slow but otherwise intact on left side. On right side, finger-to-nose impaired due to combination of weakness and impaired coordination.  Musculoskeletal:5/5 strength on left side, 3/5 in right arm and 3/5 right hip flexion, knee extension, 0/5 right dorsiflexion and plantarflexion. MAS 2 in right elbow flexors, MAS 1+ in right wrist flexors and thumb flexor.  Psych: pleasant,flataffect    Assessment & Plan:  Gregory Moreno is a 24 year old man who presents for follow-up of his glioblastoma.  Cognitive deficits: Continue SLP to work on memory, attention, neologisms, much improving!! Now off Ritalin and Amantadine. Advised walnuts, salmon, and blueberries for cognitive benefits.   Impaired gait and mobility: Continue outpatient PT and OT to work on right sided strength. Excellent that he has been ambulating with a cane! And is achieving his goals of returning to [place]  with his father.   Discussed risk of caregiver burnout and strategies to avoid. Patient is currently with excellent appetite, sleeping well, and without disturbing side effects from his radiation. Does have nausea with chemo and takes Zofran regularly. He remains pain free. Now off Baclofen.  Right sided spastic hemiparesis: He has MAS 2 in elbow flexors and 1+ in wrist flexors and thumb flexor. Discussed risks and benefits of Botox and patient and father would like to try.  --Will inject 200U in right upper extremity as follows: Biceps Brachii: 100U: 50 in 4 sites Flexor Digitorum profundus: 30U in 1 site Flexor Pollicis Longus: 123456 in 1 site Flexor Digitorum Superficialis: 30U in 1 site Adductor Pollicis: 123456 in 1 site.   All questions were encouraged and answered. Follow up with me in2 weeks for Botox injections.

## 2019-04-08 NOTE — Therapy (Signed)
[facility] [address] [place], [place], [address] Phone: [phone]   Fax:  [phone]  Physical Therapy Treatment  Patient Details  Name: Gregory Moreno MRN: [id number] Date of Birth: [birth year] Referring Provider (PT): Alger Simons, MD   Encounter Date: 04/08/2019  PT End of Session - 04/08/19 1627    Visit Number  30    Number of Visits  40    Date for PT Re-Evaluation  05/16/19    Authorization Type  [employer] - 2021, no VL, $40 copay per therapy per day    PT Start Time  1531    PT Stop Time  1618    PT Time Calculation (min)  47 min    Equipment Utilized During Treatment  Gait belt    Activity Tolerance  Patient tolerated treatment well    Behavior During Therapy  [facility] for tasks assessed/performed       No past medical history on file.  Past Surgical History:  Procedure Laterality Date  . CRANIOTOMY Left 10/13/2018   Procedure: LEFT CRANIOTOMY FOR TUMOR RESECTION;  Surgeon: Judith Part, MD;  Location: [facility];  Service: Neurosurgery;  Laterality: Left;    There were no vitals filed for this visit.  Subjective Assessment - 04/08/19 1535    Subjective  saw the doctor today - is doing very well and is going to be getting Botox injections in about 2 weeks. Still having a little bit of a headache around near where his scar is, its feeling a little better today.    Limitations  Sitting;Walking;Standing    How long can you walk comfortably?  100' with hemiwalker    Patient Stated Goals  wants to improve his walking, get back to normal    Currently in Pain?  No/denies                       [facility] Adult PT Treatment/Exercise - 04/08/19 1638      Ambulation/Gait   Ambulation/Gait  Yes    Ambulation/Gait Assistance  4: Min assist    Ambulation/Gait Assistance Details  pt needing verbal cues and ocassional assist behind RLE to prevent R genu recurvatum, with continued cues for soft knee,  assist at pt's R ASIS for tactile cue (and verbal cues provided) to press into therapist's hand during initation of swing phase for pelvic protraction, pt with improvement of gait mechanics and foot clearance throughout bouts of gait     Ambulation Distance (Feet)  115 Feet   x1, 100 x1   Assistive device  Small based quad cane   R AFO   Gait Pattern  Decreased stance time - right;Decreased step length - left;Decreased weight shift to right;Decreased hip/knee flexion - right;Right genu recurvatum;Trunk flexed;Narrow base of support;Poor foot clearance - right;Step-through pattern;Decreased dorsiflexion - right    Ambulation Surface  Level;Indoor    Stairs  Yes    Stairs Assistance  4: Min guard;4: Min assist    Stairs Assistance Details (indicate cue type and reason)  cues for weight shift and R quad activation, needing assist to clear RLE when stepping onto step due to weakness and foot getting caught on the lip of the stair. min guard when descending, cues for foot placement with RLE as pt tends to place with more narrow BOS    Stair Management Technique  One rail Left;Step to pattern;Forwards    Number of Stairs  8    Height of  Stairs  6      Exercises   Exercises  Other Exercises    Other Exercises   Sitting at edge of mat, doffed pt's shoes and socks, assessed pt's RLE PROM and ankle strength due to potentially receiving botox in RLE, pt unable to ilicit active ankle DF- instead performs great toe extension, and trace movement in R ankle PF. Pt with tightness of R ankle DF, states that they are not performing stretching at home. Demonstrated to pt's dad how to perform ankle DF stretch with leg extended, with verbalized understanding. Therapist performing passive DF with leg extended 5 x 15 second bouts. With no socks on: had pt perform mini squat with therapist providing mobilization to R ankle for incr DF ROM - brief x5 second mini squat x10 reps. In supine position: bent knee fall outs to R  x10 reps no resistance, tried yellow t-band however pt with incr compensatory movement, educated to just perform at home without resistance. Standing at edge of mat with single UE support and theraband around distal thighs: tried L side stepping to L for weight shifting to R and weight bearing and to strengthen R hip ABD, pt needing cues for technique and manual assist for proper pelvic position, approx. 2 x 7 reps             PT Education - 04/08/19 1627    Education Details  importance of performing DF stretch and supine hip ABD with RLE for HEP    Person(s) Educated  Patient;Parent(s)    Methods  Explanation;Demonstration    Comprehension  Verbalized understanding;Returned demonstration       PT Short Term Goals - 03/17/19 2023      PT SHORT TERM GOAL #1   Title  Pt will ambulate at least 115' with supervision with small base quad cane in order to improve household mobility. ALL STGS DUE 04/14/19    Time  4    Period  Weeks    Status  New    Target Date  04/14/19      PT SHORT TERM GOAL #2   Title  Patient will undergo further assessment of gait speed with LRAD - goal to be written as appropriate.    Period  Weeks    Status  New      PT SHORT TERM GOAL #3   Title  Pt will perform at least 5 sit <> stands from mat table with proper technique with no cueing and mod I in order to improve functional transfers and demo improved LE strength.    Time  4    Period  Weeks    Status  New      PT SHORT TERM GOAL #4   Title  Pt will perform all bed mobility with supervision in order to decrease caregiver burden.    Time  4    Period  Weeks    Status  New      PT SHORT TERM GOAL #5   Title  Patient will perform at least 4 steps using single handrail and step to pattern with min guard in order to safely enter/exit the RV.    Time  4    Period  Weeks    Status  New        PT Long Term Goals - 03/17/19 2026      PT LONG TERM GOAL #1   Title  Patient and pt's family will be  independent with final HEP designed  to improve movement and strength in RLE.  ALL LTGS DUE 05/12/19    Baseline  on-going    Time  8    Period  Weeks    Status  New    Target Date  05/12/19      PT LONG TERM GOAL #2   Title  Pt will ambulate at least 200' over level indoor and outdoor paved surfaces with supervision in order to improve functional mobility with small base quad cane vs. LRAD    Time  8    Period  Weeks    Status  New      PT LONG TERM GOAL #3   Title  Pt will perform at least 10 sit <> stands from mat table with proper technique with no cueing and mod I in order to improve functional transfers and demo improved LE strength.    Time  8    Period  Weeks    Status  On-going      PT LONG TERM GOAL #4   Title  Pt will perform all bed mobility from level mat table with mod I in order to decrease caregiver burden.    Time  12    Period  Weeks    Status  On-going      PT LONG TERM GOAL #5   Title  Patient will perform at least 4 steps using single handrail and step to pattern with supervision in order to safely enter/exit the RV.    Baseline  8 steps using single handrail and step to pattern with min A ascending/descending    Time  8    Period  Weeks    Status  Achieved      PT LONG TERM GOAL #6   Title  Pt will perform a curb with min guard in order to improve community mobility and independence.    Time  8    Period  Weeks    Status  New            Plan - 04/08/19 1633    Clinical Impression Statement  Focus of today's skilled session was DF PROM, gait training, stair training, and BLE strengthening. Pt with continued improvement of gait mechanics with R foot clearance with verbal and tactile cues at pt's R ASIS for hip protraction. When performing stairs, pt with difficulty placing RLE when descending, with tendency for pt to have more narrow BOS requiring min guard. Educated on performing parent assisted DF stretch with leg extended and also with supine bent  knee fall outs on R without resistance for hip ABD strengthening. Will continue to progress towards LTGs.    Personal Factors and Comorbidities  --   pt ungergoing chemo/radiation   Examination-Activity Limitations  Bed Mobility;Locomotion Level;Sit;Squat;Stairs;Stand;Transfers    Examination-Participation Restrictions  Community Activity;School;Driving    Stability/Clinical Decision Making  Evolving/Moderate complexity    Rehab Potential  Good    PT Frequency  2x / week    PT Duration  8 weeks    PT Treatment/Interventions  ADLs/Self Care Home Management;Therapeutic exercise;Therapeutic activities;Functional mobility training;Stair training;Gait training;DME Instruction;Balance training;Neuromuscular re-education;Patient/family education;Orthotic Fit/Training;Wheelchair mobility training;Energy conservation;Passive range of motion    PT Next Visit Plan  SLS - weight shifting towards R, mini squats, tall kneeling. gait outdoors stair training. Weight shifting training, rockerboard -seated and standing for postural control/trunk righting.  step taps with LLE    PT Home Exercise Plan  [id number]    Consulted and Agree with Plan of  Care  Patient;Family member/caregiver    Family Member Consulted  dad, [place]       Patient will benefit from skilled therapeutic intervention in order to improve the following deficits and impairments:  Abnormal gait, Decreased activity tolerance, Decreased balance, Decreased cognition, Decreased coordination, Decreased safety awareness, Decreased range of motion, Decreased mobility, Difficulty walking, Decreased strength, Decreased endurance, Impaired tone  Visit Diagnosis: Hemiplegia affecting right dominant side, unspecified etiology, unspecified hemiplegia type (HCC)  Unsteadiness on feet  Muscle weakness (generalized)  Other symptoms and signs involving the nervous system  Other abnormalities of gait and mobility     Problem List Patient Active Problem  List   Diagnosis Date Noted  . Goals of care, counseling/discussion 12/16/2018  . Palliative care by specialist   . Glioblastoma multiforme of temporal lobe ([facility]) 11/11/2018  . Spastic hemiparesis ([facility])   . Cerebral edema (HCC)   . Intracranial tumor ([facility])   . Steroid-induced hyperglycemia   . Spastic hemiplegia affecting nondominant side ([facility])   . Hyponatremia   . Transaminitis   . Leucocytosis   . Right spastic hemiplegia ([facility]) 10/21/2018  . Dysphagia 10/21/2018  . 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 Brain mass   . ICH (intracerebral hemorrhage) ([facility]) 10/13/2018    Arliss Journey, PT, DPT  04/08/2019, 4:42 PM  [facility] [address] [place], [place], [address] Phone: [phone]   Fax:  [phone]  Name: STEFANIE NEVITT MRN: [id number] Date of Birth: [birth year]

## 2019-04-08 NOTE — Therapy (Signed)
[facility] [address] [place], [place], [address] Phone: [phone]   Fax:  [phone]  Physical Therapy Treatment  Patient Details  Name: Gregory Moreno MRN: [id number] Date of Birth: 1995-02-12 Referring Provider (PT): Alger Simons, MD   Encounter Date: 04/07/2019  PT End of Session - 04/08/19 1210    Visit Number  29    Number of Visits  40    Date for PT Re-Evaluation  05/16/19    Authorization Type  [employer] - 2021, no VL, $40 copay per therapy per day    PT Start Time  1532    PT Stop Time  1615    PT Time Calculation (min)  43 min    Equipment Utilized During Treatment  Gait belt    Activity Tolerance  Patient tolerated treatment well    Behavior During Therapy  [facility] for tasks assessed/performed       No past medical history on file.  Past Surgical History:  Procedure Laterality Date  . CRANIOTOMY Left 10/13/2018   Procedure: LEFT CRANIOTOMY FOR TUMOR RESECTION;  Surgeon: Judith Part, MD;  Location: [facility];  Service: Neurosurgery;  Laterality: Left;    Vitals:   04/07/19 1539  BP: 131/83  Pulse: 80    Subjective Assessment - 04/07/19 1534    Subjective  had a fall on Saturday - was trying to pet the cat when standing up, was bending over too much. was able to get up by himself. having a little bit of a headache today, rating it a 3-4/10.    Limitations  Sitting;Walking;Standing    How long can you walk comfortably?  100' with hemiwalker    Patient Stated Goals  wants to improve his walking, get back to normal                       [facility] Adult PT Treatment/Exercise - 04/08/19 1219      Transfers   Sit to Stand  5: Supervision;With upper extremity assist;Without upper extremity assist    Sit to Stand Details  Verbal cues for sequencing;Verbal cues for technique;Tactile cues for initiation    Sit to Stand Details (indicate cue type and reason)  from mat table throughout  session, cues for anterior pelvic tilt and posture prior to standing      Stand to Sit  5: Supervision      Ambulation/Gait   Ambulation/Gait  Yes    Ambulation/Gait Assistance  4: Min assist    Ambulation/Gait Assistance Details  pt needing verbal cues and ocassional assist behind RLE to prevent R genu recurvatum, assist at pt's R ASIS for tactile (therapist providing verbal cues as well) to press into hand during initiation of swing phase for pelvis protraction, pt with improved gait mechanics and foot clearance without rotation of pt's ankle during terminal stance/pre-swing    Ambulation Distance (Feet)  115 Feet    Assistive device  Small based quad cane   R AFO   Gait Pattern  Decreased stance time - right;Decreased step length - left;Decreased weight shift to right;Decreased hip/knee flexion - right;Right genu recurvatum;Trunk flexed;Narrow base of support;Poor foot clearance - right;Step-through pattern;Decreased dorsiflexion - right    Ambulation Surface  Level;Indoor      Neuro Re-ed    Neuro Re-ed Details   in tall kneeling on mat table: had pt perform stand pivot transfer to mat with min guard, pt weight shifting towards R to bring  LLE onto mat, had pt scoot on knees with single UE assist on blue kaye bench - performed 2 x 10 reps tall kneel mini squats with cues for proper technique and glute activation when upright, maintaining tall kneel: stepping LLE out to the side and back into midline for increased weight shifting/weight bearing towards RLE- min guard, cues for technique as pt when incr fatigued tends to perform with more forward flexed posture over bench. needed brief rest break between activity with BUE support on kaye bench . sitting at edge of mat table on green disc: 10 reps A/P weight shifting with posterior/anterior pelvic tilt - needed additional manual assistance to maintain anterior pelvic tilt, 10 reps weight shifting R/L with mirror for visual cue for head in midline and  for weight shifting and trunk elongation      Exercises   Exercises  Other Exercises    Other Exercises   in supine position: performed 1 x 10 reps bridging with pt pushing into therapist's hands with R ASIS for R hip protraction, next performed 1 x 10 reps bridging with R/L hip protraction x2 reps in order to reach full ROM (needed initial visual and verbal cues, but pt able to pick up exercise after 1st attempt), standing at edge of mat table with single UE support: with green theraband around pelvis- verbal cues to push R ASIS into green band and hold for 3 seconds, x10 reps                PT Short Term Goals - 03/17/19 2023      PT SHORT TERM GOAL #1   Title  Pt will ambulate at least 115' with supervision with small base quad cane in order to improve household mobility. ALL STGS DUE 04/14/19    Time  4    Period  Weeks    Status  New    Target Date  04/14/19      PT SHORT TERM GOAL #2   Title  Patient will undergo further assessment of gait speed with LRAD - goal to be written as appropriate.    Period  Weeks    Status  New      PT SHORT TERM GOAL #3   Title  Pt will perform at least 5 sit <> stands from mat table with proper technique with no cueing and mod I in order to improve functional transfers and demo improved LE strength.    Time  4    Period  Weeks    Status  New      PT SHORT TERM GOAL #4   Title  Pt will perform all bed mobility with supervision in order to decrease caregiver burden.    Time  4    Period  Weeks    Status  New      PT SHORT TERM GOAL #5   Title  Patient will perform at least 4 steps using single handrail and step to pattern with min guard in order to safely enter/exit the RV.    Time  4    Period  Weeks    Status  New        PT Long Term Goals - 03/17/19 2026      PT LONG TERM GOAL #1   Title  Patient and pt's family will be independent with final HEP designed to improve movement and strength in RLE.  ALL LTGS DUE 05/12/19     Baseline  on-going  Time  8    Period  Weeks    Status  New    Target Date  05/12/19      PT LONG TERM GOAL #2   Title  Pt will ambulate at least 200' over level indoor and outdoor paved surfaces with supervision in order to improve functional mobility with small base quad cane vs. LRAD    Time  8    Period  Weeks    Status  New      PT LONG TERM GOAL #3   Title  Pt will perform at least 10 sit <> stands from mat table with proper technique with no cueing and mod I in order to improve functional transfers and demo improved LE strength.    Time  8    Period  Weeks    Status  On-going      PT LONG TERM GOAL #4   Title  Pt will perform all bed mobility from level mat table with mod I in order to decrease caregiver burden.    Time  12    Period  Weeks    Status  On-going      PT LONG TERM GOAL #5   Title  Patient will perform at least 4 steps using single handrail and step to pattern with supervision in order to safely enter/exit the RV.    Baseline  8 steps using single handrail and step to pattern with min A ascending/descending    Time  8    Period  Weeks    Status  Achieved      PT LONG TERM GOAL #6   Title  Pt will perform a curb with min guard in order to improve community mobility and independence.    Time  8    Period  Weeks    Status  New            Plan - 04/08/19 1215    Clinical Impression Statement  Focus of today's skilled session was weight shifting and strengthening, and NMR for hip protraction for gait. Pt able to tolerate in tall kneeling position for a longer period of time today and demonstrated improvement in getting in and out of position with less assist. Pt with improvement with gait mechanics and R foot clearance with therapist at pt's pelvis asking pt to push R ASIS into therapist's hand. When ambulating out of clinic, pt reverts back to old pattern with decr foot clearance and foot rotation. Will continue to progress towards LTGs.    Personal  Factors and Comorbidities  --   pt ungergoing chemo/radiation   Examination-Activity Limitations  Bed Mobility;Locomotion Level;Sit;Squat;Stairs;Stand;Transfers    Examination-Participation Restrictions  Community Activity;School;Driving    Stability/Clinical Decision Making  Evolving/Moderate complexity    Rehab Potential  Good    PT Frequency  2x / week    PT Duration  8 weeks    PT Treatment/Interventions  ADLs/Self Care Home Management;Therapeutic exercise;Therapeutic activities;Functional mobility training;Stair training;Gait training;DME Instruction;Balance training;Neuromuscular re-education;Patient/family education;Orthotic Fit/Training;Wheelchair mobility training;Energy conservation;Passive range of motion    PT Next Visit Plan  SLS - weight shifting towards R, mini squats, tall kneeling. gait outdoors stair training. Weight shifting training, rockerboard -seated and standing for postural control/trunk righting.  step taps with LLE    PT Home Exercise Plan  [id number]    Consulted and Agree with Plan of Care  Patient;Family member/caregiver    Family Member Consulted  dad, Jakson       Patient  will benefit from skilled therapeutic intervention in order to improve the following deficits and impairments:  Abnormal gait, Decreased activity tolerance, Decreased balance, Decreased cognition, Decreased coordination, Decreased safety awareness, Decreased range of motion, Decreased mobility, Difficulty walking, Decreased strength, Decreased endurance, Impaired tone  Visit Diagnosis: Hemiplegia affecting right dominant side, unspecified etiology, unspecified hemiplegia type (HCC)  Unsteadiness on feet  Muscle weakness (generalized)  Other symptoms and signs involving the nervous system  Other abnormalities of gait and mobility     Problem List Patient Active Problem List   Diagnosis Date Noted  . Goals of care, counseling/discussion 12/16/2018  . Palliative care by specialist   .  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 Spastic hemiparesis ([place])   . Cerebral edema (HCC)   . Intracranial tumor ([place])   . Steroid-induced hyperglycemia   . Spastic hemiplegia affecting nondominant side ([place])   . Hyponatremia   . Transaminitis   . Leucocytosis   . Right spastic hemiplegia ([place]) 10/21/2018  . Dysphagia 10/21/2018  . 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 Brain mass   . ICH (intracerebral hemorrhage) ([place]) 10/13/2018    Arliss Journey, PT, DPT  04/08/2019, 12:27 PM  [facility] [address] [place], [place], [address] Phone: [phone]   Fax:  [phone]  Name: ZAYAAN SANGHERA MRN: [id number] Date of Birth: 1995/06/29

## 2019-04-08 NOTE — Therapy (Signed)
[facility] [address] [place], [place], [address] Phone: [phone]   Fax:  [phone]  Occupational Therapy Treatment  Patient Details  Name: Gregory Moreno MRN: [id number] Date of Birth: 1995-12-29 Referring Provider (OT): Mar[place]e [place]   Encounter Date: 04/08/2019  OT End of Session - 04/08/19 1500    Visit Number  30    Number of Visits  41    Date for OT Re-Evaluation  05/17/19    Authorization Type  No VL - No auth required    OT Start Time  1400    OT Stop Time  1445    OT Time Calculation (min)  45 min    Activity Tolerance  Patient tolerated treatment well    Behavior During Therapy  [facility] for tasks assessed/performed       History reviewed. No pertinent past medical history.  Past Surgical History:  Procedure Laterality Date  . CRANIOTOMY Left 10/13/2018   Procedure: LEFT CRANIOTOMY FOR TUMOR RESECTION;  Surgeon: Judith Part, MD;  Location: [facility];  Service: Neurosurgery;  Laterality: Left;    There were no vitals filed for this visit.  Subjective Assessment - 04/08/19 1415    Subjective   Patient just returned from appointment with physiatrist today.  Considering botox in three weeks to UE, and  LE    Patient is accompanied by:  Family member    Currently in Pain?  No/denies    Pain Score  0-No pain                   OT Treatments/Exercises (OP) - 04/08/19 1456      Neurological Re-education Exercises   Other Exercises 1  Neuromuscular reeducation to address RUE movement on moveable surface with emphasis on postural control and alignment.      Other Exercises 2  Patient able to recruit active wrist and digit extension following brief estim facilitation and guided movement.        Acupuncturist Location  dorsal forearm    Electrical Stimulation Action  wrist/digit extension    Electrical Stimulation Parameters  Range from 5.5-7.0     Electrical Stimulation Goals  Neuromuscular facilitation             OT Education - 04/08/19 1500    Education Details  Discussed use of botox for RUE    Person(s) Educated  Patient;Parent(s)    Methods  Explanation    Comprehension  Verbalized understanding       OT Short Term Goals - 03/31/19 1625      OT SHORT TERM GOAL #6   Title  Patient will don/doff underwear, pants, and left shoe with min assist    Status  Achieved      OT SHORT TERM GOAL #7   Title  Patient will complete shower transfer with supervision    Status  Achieved      OT [place] #8   Title  Patient will bathe self in shower with supervision and cueing assistance    Status  Achieved        OT Long Term Goals - 03/16/19 1558      OT LONG TERM GOAL #1   Title  Patient will dress himself with no greater than set up assistance    Time  8    Period  Weeks    Target Date  05/15/19      OT LONG TERM  GOAL #2   Title  Patient will shower with supervision    Time  12    Period  Weeks    Status  On-going   now short temr goal     OT LONG TERM GOAL #3   Title  Patient will cut food on plate with modified independence    Time  8    Period  Weeks    Status  On-going      OT LONG TERM GOAL #4   Title  Patient will demonstrate ability to retrieve a lightweight item (less than 2lb) from waist height or lower and transport 6-12 inches laterally with RUE    Time  8    Period  Weeks    Status  On-going      OT LONG TERM GOAL #5   Title  Patient will demonstrate RUE  mid level reach with minimal LUE support to hit a target at chest height directly in front of body    Time  8    Period  Weeks    Status  On-going            Plan - 04/08/19 1501    Clinical Impression Statement  Patient shows steady progress with functional mobility and ADL participation.  Patient with limited functional use of RUE due to significant muscle tension - unbalanced muscle activity, and apraxia.    OT  Frequency  2x / week    OT Duration  8 weeks    OT Treatment/Interventions  Self-care/ADL training;DME and/or AE instruction;Splinting;Balance training;Aquatic Therapy;Therapeutic activities;Therapeutic exercise;Cognitive remediation/compensation;Neuromuscular education;Functional Mobility Training;Visual/perceptual remediation/compensation;Electrical Stimulation;Manual Therapy;Patient/family education    Plan  NMR RUE/trunk - estim triceps, wrist digits?, forced functional use of BUE up on feet - Static to dynamic stand balance, check goals, review aquatics instructions    Consulted and Agree with Plan of Care  Patient;Family member/caregiver    Family Member Consulted  dad       Patient will benefit from skilled therapeutic intervention in order to improve the following deficits and impairments:           Visit Diagnosis: Hemiplegia affecting right dominant side, unspecified etiology, unspecified hemiplegia type ([place])  Unsteadiness on feet  Muscle weakness (generalized)  Apraxia  Other symptoms and signs involving the nervous system  Visuospatial deficit    Problem List Patient Active Problem List   Diagnosis Date Noted  . Goals of care, counseling/discussion 12/16/2018  . Palliative care by specialist   . 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 Spastic hemiparesis ([place])   . Cerebral edema (HCC)   . Intracranial tumor ([place])   . Steroid-induced hyperglycemia   . Spastic hemiplegia affecting nondominant side ([place])   . Hyponatremia   . Transaminitis   . Leucocytosis   . Right spastic hemiplegia ([place]) 10/21/2018  . Dysphagia 10/21/2018  . 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 Brain mass   . ICH (intracerebral hemorrhage) ([place]) 10/13/2018    Mariah Milling, OTR/L 04/08/2019, 3:04 PM  [facility] [address] [place], [place], [address] Phone: [phone]   Fax:   [phone]  Name: Gregory Moreno MRN: [id number] Date of Birth: Mar 06, 1995

## 2019-04-10 ENCOUNTER — Other Ambulatory Visit: Payer: Self-pay

## 2019-04-10 ENCOUNTER — Ambulatory Visit
Admission: RE | Admit: 2019-04-10 | Discharge: 2019-04-10 | Disposition: A | Discharge status: Home / Self Care | Payer: 59 | Source: Ambulatory Visit | Attending: Internal Medicine | Admitting: Internal Medicine

## 2019-04-10 DIAGNOSIS — C712 Malignant neoplasm of temporal lobe: Secondary | ICD-10-CM

## 2019-04-10 MED ORDER — GADOBENATE DIMEGLUMINE 529 MG/ML IV SOLN
17.0000 mL | Freq: Once | INTRAVENOUS | Status: AC | PRN
  Administered 2019-04-10: 17 mL via INTRAVENOUS

## 2019-04-13 ENCOUNTER — Inpatient Hospital Stay: Payer: 59 | Admitting: Internal Medicine

## 2019-04-13 ENCOUNTER — Other Ambulatory Visit: Payer: Self-pay

## 2019-04-13 ENCOUNTER — Inpatient Hospital Stay: Payer: 59

## 2019-04-13 VITALS — BP 128/77 | HR 88 | Temp 98.5°F | Resp 18 | Ht 69.0 in | Wt 187.1 lb

## 2019-04-13 DIAGNOSIS — C712 Malignant neoplasm of temporal lobe: Secondary | ICD-10-CM | POA: Diagnosis not present

## 2019-04-13 DIAGNOSIS — Z7189 Other specified counseling: Secondary | ICD-10-CM

## 2019-04-13 LAB — CBC WITH DIFFERENTIAL (CANCER CENTER ONLY)
Abs Immature Granulocytes: 0.01 10*3/uL (ref 0.00–0.07)
Basophils Absolute: 0 10*3/uL (ref 0.0–0.1)
Basophils Relative: 0 %
Eosinophils Absolute: 0 10*3/uL (ref 0.0–0.5)
Eosinophils Relative: 1 %
HCT: 34.6 % — ABNORMAL LOW (ref 39.0–52.0)
Hemoglobin: 11.9 g/dL — ABNORMAL LOW (ref 13.0–17.0)
Immature Granulocytes: 0 %
Lymphocytes Relative: 12 %
Lymphs Abs: 0.4 10*3/uL — ABNORMAL LOW (ref 0.7–4.0)
MCH: 31.4 pg (ref 26.0–34.0)
MCHC: 34.4 g/dL (ref 30.0–36.0)
MCV: 91.3 fL (ref 80.0–100.0)
Monocytes Absolute: 0.4 10*3/uL (ref 0.1–1.0)
Monocytes Relative: 10 %
Neutro Abs: 2.8 10*3/uL (ref 1.7–7.7)
Neutrophils Relative %: 77 %
Platelet Count: 22 10*3/uL — ABNORMAL LOW (ref 150–400)
RBC: 3.79 MIL/uL — ABNORMAL LOW (ref 4.22–5.81)
RDW: 14.7 % (ref 11.5–15.5)
WBC Count: 3.7 10*3/uL — ABNORMAL LOW (ref 4.0–10.5)
nRBC: 0 % (ref 0.0–0.2)

## 2019-04-13 LAB — CMP (CANCER CENTER ONLY)
ALT: 12 U/L (ref 0–44)
AST: 14 U/L — ABNORMAL LOW (ref 15–41)
Albumin: 3.8 g/dL (ref 3.5–5.0)
Alkaline Phosphatase: 59 U/L (ref 38–126)
Anion gap: 10 (ref 5–15)
BUN: 10 mg/dL (ref 6–20)
CO2: 24 mmol/L (ref 22–32)
Calcium: 9.7 mg/dL (ref 8.9–10.3)
Chloride: 108 mmol/L (ref 98–111)
Creatinine: 0.85 mg/dL (ref 0.61–1.24)
GFR, Est AFR Am: >60 mL/min (ref >60)
GFR, Estimated: >60 mL/min (ref >60)
Glucose, Bld: 85 mg/dL (ref 70–99)
Potassium: 3.8 mmol/L (ref 3.5–5.1)
Sodium: 142 mmol/L (ref 135–145)
Total Bilirubin: 0.5 mg/dL (ref 0.3–1.2)
Total Protein: 8 g/dL (ref 6.5–8.1)

## 2019-04-13 NOTE — Progress Notes (Signed)
[facility] [place], [place] [address] [phone]   Interval Evaluation  Date of Service: 04/13/19 Patient Name: Gregory Moreno Patient MRN: [id number] Patient DOB: 06-Feb-1995 Provider: Ventura Sellers, MD  Identifying Statement:  Gregory Moreno is a 24 y.o. male with left temporal glioblastoma   Referring Provider: [facility] [address] [place],  [place] [address]  Oncologic History: Oncology History  Glioblastoma multiforme of temporal lobe ([facility])  11/11/2018 Initial Diagnosis   Glioblastoma multiforme of temporal lobe ([facility])   11/25/2018 - 12/01/2018 Chemotherapy   The patient had [No matching medication found in this treatment plan]  for chemotherapy treatment.    02/09/2019 -  Chemotherapy   The patient had [No matching medication found in this treatment plan]  for chemotherapy treatment.      Biomarkers:  MGMT Unknown.  IDH 1/2 Wild type.  EGFR Unknown  TERT Unknown   Interval History:  Gregory Moreno presents today for follow up, now having completed second cycle of adjuvant Temodar.  Right side strength continues to improve, he was able to ambulate with a cane into clinic today.  He is no longer relying on wheelchair at home for the most part.  Remains with dense left visual field impairment.  Language has been stable; still has good understanding but struggles to put sentences together.  Less nausea with Temodar this month.  Medications: Current Outpatient Medications on File Prior to Visit  Medication Sig Dispense Refill  . escitalopram (LEXAPRO) 5 MG tablet TAKE 1 TABLET BY MOUTH EVERYDAY AT BEDTIME 90 tablet 1  . ondansetron (ZOFRAN) 4 MG tablet Take 1 tablet (4 mg total) by mouth every 8 (eight) hours as needed for nausea or vomiting. 30 tablet 1  . ondansetron (ZOFRAN) 8 MG tablet TAKE 1 TABLET BY MOUTH TWICE DAILY AS NEEDED FOR NAUSEA AND VOMITING. MAY TAKE  30-60 MINUTES PRIOR TO TEMODAR ADMINISTRATION IF NAUSEA/VOMITING OCCURS. 30 tablet 0  . temozolomide (TEMODAR) 100 MG capsule Take 4 capsules (400 mg total) by mouth daily. May take on an empty stomach to decrease nausea & vomiting. 20 capsule 0  . [DISCONTINUED] escitalopram (LEXAPRO) 5 MG tablet TAKE 1 TABLET BY MOUTH EVERYDAY AT BEDTIME 30 tablet 0   No current facility-administered medications on file prior to visit.    Allergies: No Known Allergies Past Medical History: No past medical history on file. Past Surgical History:  Past Surgical History:  Procedure Laterality Date  . CRANIOTOMY Left 10/13/2018   Procedure: LEFT CRANIOTOMY FOR TUMOR RESECTION;  Surgeon: Judith Part, MD;  Location: [facility];  Service: Neurosurgery;  Laterality: Left;   Social History:  Social History   Socioeconomic History  . Marital status: Single    Spouse name: Not on file  . Number of children: Not on file  . Years of education: Not on file  . Highest education level: Not on file  Occupational History  . Not on file  Tobacco Use  . Smoking status: Never Smoker  . Smokeless tobacco: Never Used  Substance and Sexual Activity  . Alcohol use: Not on file  . Drug use: Not on file  . Sexual activity: Not on file  Other Topics Concern  . Not on file  Social History Narrative  . Not on file   Social Determinants of Health   Financial Resource Strain:   . Difficulty of Paying Living Expenses:   Food Insecurity:   .  Worried About Charity fundraiser in the Last Year:   . Arboriculturist in the Last Year:   Transportation Needs:   . Film/video editor (Medical):   Marland Kitchen Lack of Transportation (Non-Medical):   Physical Activity:   . Days of Exercise per Week:   . Minutes of Exercise per Session:   Stress:   . Feeling of Stress :   Social Connections:   . Frequency of Communication with Friends and Family:   . Frequency of Social Gatherings with Friends and Family:   . Attends  Religious Services:   . Active Member of Clubs or Organizations:   . Attends Archivist Meetings:   Marland Kitchen Marital Status:   Intimate Partner Violence:   . Fear of Current or Ex-Partner:   . Emotionally Abused:   Marland Kitchen Physically Abused:   . Sexually Abused:    Family History: No family history on file.  Review of Systems: Constitutional: Denies fevers, chills or abnormal weight loss Eyes: Denies blurriness of vision Ears, nose, mouth, throat, and face: Denies mucositis or sore throat Respiratory: Denies cough, dyspnea or wheezes Cardiovascular: Denies palpitation, chest discomfort or lower extremity swelling Gastrointestinal:  Denies nausea, constipation, diarrhea GU: Denies dysuria or incontinence Skin: Denies abnormal skin rashes Neurological: Per HPI Musculoskeletal: Denies joint pain, back or neck discomfort. No decrease in ROM Behavioral/Psych: Denies anxiety, disturbance in thought content, and mood instability  Physical Exam: Vitals:   04/13/19 1001  BP: 128/77  Pulse: 88  Resp: 18  Temp: 98.5 F (36.9 C)  SpO2: 100%   KPS: 60. General: Alert, cooperative, pleasant, in no acute distress Head: Craniotomy scar noted, dry and intact. EENT: No conjunctival injection or scleral icterus. Oral mucosa moist Lungs: Resp effort normal Cardiac: Regular rate and rhythm Abdomen: Soft, non-distended abdomen Skin: No rashes cyanosis or petechiae. Extremities: No clubbing or edema  Neurologic Exam: Mental Status: Awake, alert, attentive to examiner. Oriented to self and environment. Language is densely impaired with regards to fluency.  Comprehension impaired to multi-step complex commands.  Simple repetition is preserved. Cranial Nerves: Visual acuity is grossly normal. Dense R homonymous hemianopia. Extra-ocular movements intact. No ptosis. Face is symmetric, tongue midline. Motor: Tone and bulk are normal. Power is 3/5 in right arm and leg, 5/5 on left side. Reflexes are  symmetric, no pathologic reflexes present. Sensory: Intact to light touch and temperature Gait: Deferred  Labs: I have reviewed the data as listed    Component Value Date/Time   NA 142 04/13/2019 0935   K 3.8 04/13/2019 0935   CL 108 04/13/2019 0935   CO2 24 04/13/2019 0935   GLUCOSE 85 04/13/2019 0935   BUN 10 04/13/2019 0935   CREATININE 0.85 04/13/2019 0935   CALCIUM 9.7 04/13/2019 0935   PROT 8.0 04/13/2019 0935   ALBUMIN 3.8 04/13/2019 0935   AST 14 (L) 04/13/2019 0935   ALT 12 04/13/2019 0935   ALKPHOS 59 04/13/2019 0935   BILITOT 0.5 04/13/2019 0935   GFRNONAA >60 04/13/2019 0935   GFRAA >60 04/13/2019 0935   Lab Results  Component Value Date   WBC 3.7 (L) 04/13/2019   NEUTROABS 2.8 04/13/2019   HGB 11.9 (L) 04/13/2019   HCT 34.6 (L) 04/13/2019   MCV 91.3 04/13/2019   PLT 22 (L) 04/13/2019    Imaging:  [facility] Clinician Interpretation: I have personally reviewed the CNS images as listed.  My interpretation, in the context of the patient's clinical presentation, is treatment effect  vs true progression  MR Brain W Wo Contrast  Result Date: 04/11/2019 CLINICAL DATA:  Glioblastoma EXAM: MRI HEAD WITHOUT AND WITH CONTRAST TECHNIQUE: Multiplanar, multiecho pulse sequences of the brain and surrounding structures were obtained without and with intravenous contrast. CONTRAST:  12m MULTIHANCE GADOBENATE DIMEGLUMINE 529 MG/ML IV SOLN COMPARISON:  MRI 02/06/2019 FINDINGS: Brain: Postop resection of left temporal lobe. 15 mm nodular area of presumed tumor in the left medial temporal lobe is smaller compared to the prior study. Masslike enhancement of the left posterior limb internal capsule has progressed significantly and is most compatible with tumor progression. This area measures approximately 10 x 15 mm axial image 64. This area shows progression of susceptibility suggesting interval hemorrhage. Progression of surrounding increased signal on FLAIR. No other new areas of tumor  growth. Ventricle size normal. No midline shift. No acute infarct. Hyperintensity in the left cerebral peduncle and pons due to layering degeneration which has progressed. 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 No acute infarct. Vascular: Normal arterial flow voids Skull and upper cervical spine: Left temporal craniotomy. Sinuses/Orbits: Negative Other: None IMPRESSION: Progression of enhancement posterior limb internal capsule left suggesting tumor progression. Progression of surrounding FLAIR signal. Progressive susceptibility in this area indicating interval hemorrhage. Nodular area of presumed tumor in the left medial temporal lobe has improved since the prior study. No new areas of tumor progression. Electronically Signed   By: CFranchot GalloM.D.   On: 04/11/2019 08:49     Assessment/Plan Glioblastoma multiforme of temporal lobe ([facility] [C71.2]   ABlenda Mount[place] clinically stable now having completed cycle #2 of adjuvant Temodar.  MRI demonstrates evolving contrast enhancement in the left internal capsule, consistent with either tumor progression or pseudo-progression.  This area was faintly enhancing on post-op MRI, so recurrence or alteration of enhancement pattern may not be concerning in this context.  Additionally, his clinical deficits have continued to improve, which we would not expect given the location here in posterior limb of internal capsule.  Labs demonstrate significant thrombocytopenia today.  We will defer further chemotherapy at this time and plan to repeat and re-evaluate labs in 1 week.  We also discussed and patient consented for additional tumor profiling and sequencing through [facility]  Advanced tumor profiling could help identify actionable mutation for targeted therapy and lead to direct clinical benefit.     Clinically, we continue to be very pleased with his functional improvement through aggressive neuro-directed PT and OT.  We will follow up with  him and his family after next Monday's labs, via phone.  Will recommend repeating brain MRI in 1 month given changes observed today.  All questions were answered. The patient knows to call the clinic with any problems, questions or concerns. No barriers to learning were detected.  The total time spent in the encounter was 40 minutes and more than 50% was on counseling and review of test results   ZVentura Sellers MD Medical Director of Neuro-Oncology [facility]03/29/21 9:51 AM

## 2019-04-14 ENCOUNTER — Ambulatory Visit: Payer: 59 | Admitting: Physical Medicine and Rehabilitation

## 2019-04-14 ENCOUNTER — Telehealth: Payer: Self-pay | Admitting: Internal Medicine

## 2019-04-14 NOTE — Telephone Encounter (Signed)
Scheduled appt per 3/29 los.  Spoke with pt mom and she is aware of the appt date and  Time.

## 2019-04-15 ENCOUNTER — Encounter: Payer: Self-pay | Admitting: Physical Therapy

## 2019-04-15 ENCOUNTER — Other Ambulatory Visit: Payer: Self-pay

## 2019-04-15 ENCOUNTER — Ambulatory Visit: Payer: 59 | Admitting: Physical Therapy

## 2019-04-15 ENCOUNTER — Ambulatory Visit: Payer: 59 | Admitting: Occupational Therapy

## 2019-04-15 DIAGNOSIS — R41842 Visuospatial deficit: Secondary | ICD-10-CM

## 2019-04-15 DIAGNOSIS — R482 Apraxia: Secondary | ICD-10-CM

## 2019-04-15 DIAGNOSIS — M6281 Muscle weakness (generalized): Secondary | ICD-10-CM

## 2019-04-15 DIAGNOSIS — G8191 Hemiplegia, unspecified affecting right dominant side: Secondary | ICD-10-CM

## 2019-04-15 DIAGNOSIS — R2689 Other abnormalities of gait and mobility: Secondary | ICD-10-CM

## 2019-04-15 DIAGNOSIS — R2681 Unsteadiness on feet: Secondary | ICD-10-CM

## 2019-04-15 DIAGNOSIS — R29818 Other symptoms and signs involving the nervous system: Secondary | ICD-10-CM

## 2019-04-15 NOTE — Therapy (Signed)
[facility] [address] [place], [place], [address] Phone: [phone]   Fax:  [phone]  Occupational Therapy Treatment  Patient Details  Name: Gregory Moreno MRN: [id number] Date of Birth: 07-Dec-1995 Referring Provider (OT): [facility]   Encounter Date: 04/15/2019  OT End of Session - 04/15/19 1746    Visit Number  31    Number of Visits  41    Date for OT Re-Evaluation  05/17/19    Authorization Type  No VL - No auth required    OT Start Time  1615    OT Stop Time  1700    OT Time Calculation (min)  45 min    Activity Tolerance  Patient tolerated treatment well    Behavior During Therapy  [facility] for tasks assessed/performed       No past medical history on file.  Past Surgical History:  Procedure Laterality Date  . CRANIOTOMY Left 10/13/2018   Procedure: LEFT CRANIOTOMY FOR TUMOR RESECTION;  Surgeon: Judith Part, MD;  Location: [facility];  Service: Neurosurgery;  Laterality: Left;    There were no vitals filed for this visit.  Subjective Assessment - 04/15/19 1739    Subjective   I went flying yesterday - all around!    Patient is accompanied by:  Family member    Currently in Pain?  No/denies    Pain Score  0-No pain                   OT Treatments/Exercises (OP) - 04/15/19 0001      ADLs   ADL Comments  Patient to start aquatic therapy program next week. Reviewed spcific instructions for aquatic program with patient and dad - gave written copy.        Neurological Re-education Exercises   Other Exercises 1  Neuromuscular reeducation to address reaching toward target.  Focus continues to be working toward active shoulder flexion with active elbow extension.  Patient initiates most reach patterns with shoulder elevation, abduction and elbow flexion.  In guided movement experiences, patient can elicit active shoulder flexion, with elbow extension (modified closed chain) but with open  chain attempts this is still too challenging.  Worked in quadruped to load right arm and leg prior to further attempts to address reach.  Then worked on reaching downward toward floor.  Ptient able to activate shoulder flex with elbow extension in gravity supported position while seated.      Other Exercises 2  Working to promote some distal hand /forearm/wrist coordination.  Palced flashlight in right hand - had patient turn on and off using thumb, had patient direct light to trace objects in gym - beginning to be able to change direction and force of distal movement.              OT Education - 04/15/19 1746    Education Details  Reviewed instructions for aquatic therapy    Person(s) Educated  Patient;Parent(s)    Methods  Explanation;Handout    Comprehension  Verbalized understanding       OT Short Term Goals - 03/31/19 1625      OT SHORT TERM GOAL #6   Title  Patient will don/doff underwear, pants, and left shoe with min assist    Status  Achieved      OT SHORT TERM GOAL #7   Title  Patient will complete shower transfer with supervision    Status  Achieved  OT SHORT TERM GOAL #8   Title  Patient will bathe self in shower with supervision and cueing assistance    Status  Achieved        OT Long Term Goals - 04/15/19 1750      OT LONG TERM GOAL #2   Title  Patient will shower with supervision    Status  On-going   Mom still assist with pouring shampoo into hand and washing bottom - discussed getting pump type shampoo bottle     OT LONG TERM GOAL #3   Title  Patient will cut food on plate with modified independence    Status  Achieved      OT LONG TERM GOAL #4   Title  Patient will demonstrate ability to retrieve a lightweight item (less than 2lb) from waist height or lower and transport 6-12 inches laterally with RUE    Status  On-going      OT LONG TERM GOAL #5   Title  Patient will demonstrate RUE  mid level reach with minimal LUE support to hit a target at  chest height directly in front of body    Status  On-going      Long Term Additional Goals   Additional Long Term Goals  Yes      OT LONG TERM GOAL #6   Title  Patient will demonstrate adequate postural control in standing to reach for an object outside of his base of support (right) at waist height or lower with right hand and min assistance without significant loss of balance.    Time  8    Period  Weeks    Status  On-going            Plan - 04/15/19 1747    Clinical Impression Statement  Patient is showing steady improvement with overall functional mobility and safety.  Patient continues to demonstrate significant deficits with postural control, apraxia, aphasia, and spasticity which impede active and independent performance of ADL    OT Frequency  2x / week    OT Duration  8 weeks    OT Treatment/Interventions  Self-care/ADL training;DME and/or AE instruction;Splinting;Balance training;Aquatic Therapy;Therapeutic activities;Therapeutic exercise;Cognitive remediation/compensation;Neuromuscular education;Functional Mobility Training;Visual/perceptual remediation/compensation;Electrical Stimulation;Manual Therapy;Patient/family education    Plan  Aquatic therapy starts 4/5!  Postural control, more consistent activation in right leg in standing, right arm to work out of modified closed chain toward partially open chain    Consulted and Agree with Plan of Care  Patient;Family member/caregiver    Family Member Consulted  dad       Patient will benefit from skilled therapeutic intervention in order to improve the following deficits and impairments:           Visit Diagnosis: Hemiplegia affecting right dominant side, unspecified etiology, unspecified hemiplegia type ([facility])  Unsteadiness on feet  Muscle weakness (generalized)  Other symptoms and signs involving the nervous system  Apraxia  Visuospatial deficit    Problem List Patient Active Problem List   Diagnosis Date  Noted  . Goals of care, counseling/discussion 12/16/2018  . Palliative care by specialist   . Glioblastoma multiforme of temporal lobe ([facility]) 11/11/2018  . Spastic hemiparesis ([facility])   . Cerebral edema ([facility])   . Intracranial tumor ([facility])   . Steroid-induced hyperglycemia   . Spastic hemiplegia affecting nondominant side ([facility])   . Hyponatremia   . Transaminitis   . Leucocytosis   . Right spastic hemiplegia ([facility]) 10/21/2018  . Dysphagia 10/21/2018  . 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 Brain mass   . ICH (intracerebral hemorrhage) ([facility]) 10/13/2018    Mariah Milling, OTR/L 04/15/2019, 5:55 PM  [facility] [address] [place], [place], [address] Phone: [phone]   Fax:  [phone]  Name: Gregory Moreno MRN: [id number] Date of Birth: 1995-12-26

## 2019-04-15 NOTE — Patient Instructions (Signed)
°  Aquatic Therapy: What to Expect! ° °Where:  °[facility] °[address] °[place], [place]  [address] °[phone] ° °NOTE:  You will receive an automated phone message reminding you of your appointment and it will say the appointment is at the [facility] on [address]  We are working to fix this- just know that you will meet us at the pool! ° °How to Prepare: °• Please make sure you drink 8 ounces of water about one hour prior to your pool session °• A caregiver MUST attend the entire session with the patient.  The caregiver will be responsible for assisting with dressing as well as any toileting needs.  If the patient will be doing a home program this should likely be the person who will assist as well.  °• Patients must wear either their street shoes or pool shoes until they are ready to enter the pool with the therapist.  Patients must also wear either street shoes or pool shoes once exiting the pool to walk to the locker room.  This will helps us prevent slips and falls.  °• Please arrive 15 minutes early to prepare for your pool therapy session °• Sign in at the front desk on the clipboard marked for [facility] °• You may use the locker rooms on your right and then enter directly into the recreation pool (NOT the competition pool) °• Please make sure to attend to any toileting needs prior to entering the pool °• Please be dressed in your swim suit and on the pool deck at least 5 minutes before your appointment °• Once on the pool deck your therapist will ask you to sign the Patient  Consent and Assignment of Benefits form °• Your therapist may take your blood pressure prior to, during and after your session if indicated ° °About the pool  and parking: °1. Entering the pool °Your therapist will assist you; there are multiple ways to enter including stairs with railings, a walk in ramp, a roll in chair and a mechanical lift. Your therapist will determine the most appropriate way for  you. °2. Water temperature is usually between 86-87 degrees °3. There may be other swimmers in the pool at the same time °4. Parking is free: if you arrive and there is a parking attendant please inform them you are there for therapy and do not pay to park. Handicapped parking is located at the front entrance. ° °Contact Info:     Appointments: °[facility]  All sessions are 45 minutes   °[address]  [address]     Please call the [facility] if   °[place], [place]   [address]    you need to cancel or reschedule an appointment.  °[phone]     ° ° ° ° °

## 2019-04-15 NOTE — Therapy (Signed)
[facility] [address] [place], [place], [address] Phone: [phone]   Fax:  [phone]  Physical Therapy Treatment  Patient Details  Name: Gregory Moreno MRN: [id number] Date of Birth: 1995/07/20 Referring Provider (PT): Alger Simons, MD   Encounter Date: 04/15/2019  PT End of Session - 04/15/19 1626    Visit Number  31    Number of Visits  40    Date for PT Re-Evaluation  05/16/19    Authorization Type  [employer] - 2021, no VL, $40 copay per therapy per day    PT Start Time  1532    PT Stop Time  1614    PT Time Calculation (min)  42 min    Equipment Utilized During Treatment  Gait belt    Activity Tolerance  Patient tolerated treatment well    Behavior During Therapy  [facility] for tasks assessed/performed       History reviewed. No pertinent past medical history.  Past Surgical History:  Procedure Laterality Date  . CRANIOTOMY Left 10/13/2018   Procedure: LEFT CRANIOTOMY FOR TUMOR RESECTION;  Surgeon: Judith Part, MD;  Location: [facility];  Service: Neurosurgery;  Laterality: Left;    There were no vitals filed for this visit.  Subjective Assessment - 04/15/19 1539    Subjective  saw Dr. [name] last week - has thrombocytopenia. Needs to wait for platelets to come up before starting back on chemo. Lane yesterday. No falls. Walked a half a mile 3 times each day.    Limitations  Sitting;Walking;Standing    How long can you walk comfortably?  100' with hemiwalker    Patient Stated Goals  wants to improve his walking, get back to normal    Currently in Pain?  No/denies                       [facility] Adult PT Treatment/Exercise - 04/15/19 1611      Ambulation/Gait   Ambulation/Gait  Yes    Ambulation/Gait Assistance  4: Min assist;5: Supervision    Ambulation/Gait Assistance Details  pt able to ambulate in and out of gym with supervision, min A provided by therapist to help block R genu  recurvatum and for tactile cue at ASIS during swing phase for pelvic protraction for improved foot clearance     Ambulation Distance (Feet)  200 Feet   approx throughout session   Assistive device  Small based quad cane   R AFO   Gait Pattern  Decreased stance time - right;Decreased step length - left;Decreased weight shift to right;Decreased hip/knee flexion - right;Right genu recurvatum;Trunk flexed;Narrow base of support;Poor foot clearance - right;Step-through pattern;Decreased dorsiflexion - right    Ambulation Surface  Level;Indoor    Gait velocity  33.94 seconds = .97 ft/sec      Neuro Re-ed    Neuro Re-ed Details   At edge of mat table: with RUE support on chair: SLS taps with RLE to single cone x10 reps with cues for L quad activation before tapping cone, with single UE support and then forward and cross tap to double cone x10 reps - close min guard for balance. With no UE support: brief SLS taps with LLE to 4" step 2 x 7 reps, cues for L quad activation, pt initially fearful at first but demonstrated improved confidence after 1st couple of reps, needed min A at one point to prevent anterior LOB. With yoga block between feet for wider  BOS and to help prevent hip ADD buttock raises off of mat table with 5 second hold x10 reps, followed by additional x5 reps.  With purple floor disc under LLE for increased weight shifting and weight bearing through RLE 1 x 10 reps sit <> stands with maintaining balance in standing with no UE support and focus on slow descent, therapist providing min guard and assist at pt's pelvis, cues for controlled descent                PT Short Term Goals - 04/15/19 1623      PT SHORT TERM GOAL #1   Title  Pt will ambulate at least 115' with supervision with small base quad cane in order to improve household mobility. ALL STGS DUE 04/14/19    Baseline  met on 04/15/19    Time  4    Period  Weeks    Status  Achieved    Target Date  04/14/19      PT SHORT TERM  GOAL #2   Title  Patient will undergo further assessment of gait speed with LRAD - goal to be written as appropriate.    Baseline  .97 ft/sec on 3/331/21    Period  Weeks    Status  Achieved      PT SHORT TERM GOAL #3   Title  Pt will perform at least 5 sit <> stands from mat table with proper technique with no cueing and mod I in order to improve functional transfers and demo improved LE strength.    Baseline  throughout session, partially met, needing intermittent verbal cues    Time  4    Period  Weeks    Status  Partially Met      PT SHORT TERM GOAL #4   Title  Pt will perform all bed mobility with supervision in order to decrease caregiver burden.    Baseline  not performed on today's session    Time  4    Period  Weeks    Status  Deferred      PT SHORT TERM GOAL #5   Title  Patient will perform at least 4 steps using single handrail and step to pattern with min guard in order to safely enter/exit the RV.    Baseline  not performed during today's session    Time  4    Period  Weeks    Status  Deferred        PT Long Term Goals - 03/17/19 2026      PT LONG TERM GOAL #1   Title  Patient and pt's family will be independent with final HEP designed to improve movement and strength in RLE.  ALL LTGS DUE 05/12/19    Baseline  on-going    Time  8    Period  Weeks    Status  New    Target Date  05/12/19      PT LONG TERM GOAL #2   Title  Pt will ambulate at least 200' over level indoor and outdoor paved surfaces with supervision in order to improve functional mobility with small base quad cane vs. LRAD    Time  8    Period  Weeks    Status  New      PT LONG TERM GOAL #3   Title  Pt will perform at least 10 sit <> stands from mat table with proper technique with no cueing and mod I in order to improve  functional transfers and demo improved LE strength.    Time  8    Period  Weeks    Status  On-going      PT LONG TERM GOAL #4   Title  Pt will perform all bed mobility  from level mat table with mod I in order to decrease caregiver burden.    Time  12    Period  Weeks    Status  On-going      PT LONG TERM GOAL #5   Title  Patient will perform at least 4 steps using single handrail and step to pattern with supervision in order to safely enter/exit the RV.    Baseline  8 steps using single handrail and step to pattern with min A ascending/descending    Time  8    Period  Weeks    Status  Achieved      PT LONG TERM GOAL #6   Title  Pt will perform a curb with min guard in order to improve community mobility and independence.    Time  8    Period  Weeks    Status  New            Plan - 04/15/19 1629    Clinical Impression Statement  Focus of today's skilled session was R>LLE strengthening, NMR for RLE weight shifting, gait training. Began to assess pt's STGs with pt achieving 2 out of 5 and partially meeting 1 STG. Deferred remaining 2 today due to time constraints. Pt underwent assessment of gait speed, with pt able to ambulate at .97 ft/sec with quad cane and R AFO- indicating household ambulator and that pt is at a recurrent fall risk. Pt able to perform SLS on RLE with LLE foot taps to 4" step today with no UE support, was initially fearful but pt's confidence improved with incr reps. Needed one episode of min A to prevent anterior LOB. Pt is progressing well - will continue to progress towards LTGs.    Personal Factors and Comorbidities  --   pt ungergoing chemo/radiation   Examination-Activity Limitations  Bed Mobility;Locomotion Level;Sit;Squat;Stairs;Stand;Transfers    Examination-Participation Restrictions  Community Activity;School;Driving    Stability/Clinical Decision Making  Evolving/Moderate complexity    Rehab Potential  Good    PT Frequency  2x / week    PT Duration  8 weeks    PT Treatment/Interventions  ADLs/Self Care Home Management;Therapeutic exercise;Therapeutic activities;Functional mobility training;Stair training;Gait  training;DME Instruction;Balance training;Neuromuscular re-education;Patient/family education;Orthotic Fit/Training;Wheelchair mobility training;Energy conservation;Passive range of motion    PT Next Visit Plan  check other STGs. SLS - weight shifting towards R, mini squats, tall kneeling. gait outdoors stair training. Weight shifting training, rockerboard -seated and standing for postural control/trunk righting.  step taps with LLE    PT Home Exercise Plan  [id number]    Consulted and Agree with Plan of Care  Patient;Family member/caregiver    Family Member Consulted  dad, Holbert       Patient will benefit from skilled therapeutic intervention in order to improve the following deficits and impairments:  Abnormal gait, Decreased activity tolerance, Decreased balance, Decreased cognition, Decreased coordination, Decreased safety awareness, Decreased range of motion, Decreased mobility, Difficulty walking, Decreased strength, Decreased endurance, Impaired tone  Visit Diagnosis: Hemiplegia affecting right dominant side, unspecified etiology, unspecified hemiplegia type (HCC)  Unsteadiness on feet  Muscle weakness (generalized)  Other symptoms and signs involving the nervous system  Other abnormalities of gait and mobility     Problem List  Patient Active Problem List   Diagnosis Date Noted  . Goals of care, counseling/discussion 12/16/2018  . Palliative care by specialist   . 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 Spastic hemiparesis ([place])   . Cerebral edema (HCC)   . Intracranial tumor ([place])   . Steroid-induced hyperglycemia   . Spastic hemiplegia affecting nondominant side ([place])   . Hyponatremia   . Transaminitis   . Leucocytosis   . Right spastic hemiplegia ([place]) 10/21/2018  . Dysphagia 10/21/2018  . 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 Brain mass   . ICH (intracerebral hemorrhage) ([place]) 10/13/2018    Arliss Journey, PT, DPT  04/15/2019, 4:29  PM  [facility] [address] [place], [place], [address] Phone: [phone]   Fax:  [phone]  Name: Gregory Moreno MRN: [id number] Date of Birth: 1995-03-07

## 2019-04-20 ENCOUNTER — Ambulatory Visit: Payer: 59 | Admitting: Physical Therapy

## 2019-04-20 ENCOUNTER — Other Ambulatory Visit: Payer: Self-pay

## 2019-04-20 ENCOUNTER — Encounter: Payer: 59 | Admitting: Occupational Therapy

## 2019-04-20 ENCOUNTER — Encounter: Payer: Self-pay | Admitting: Occupational Therapy

## 2019-04-20 ENCOUNTER — Inpatient Hospital Stay: Payer: 59 | Attending: Internal Medicine

## 2019-04-20 ENCOUNTER — Ambulatory Visit: Payer: 59 | Attending: Gastroenterology | Admitting: Occupational Therapy

## 2019-04-20 DIAGNOSIS — R41842 Visuospatial deficit: Secondary | ICD-10-CM | POA: Diagnosis present

## 2019-04-20 DIAGNOSIS — R29818 Other symptoms and signs involving the nervous system: Secondary | ICD-10-CM | POA: Diagnosis present

## 2019-04-20 DIAGNOSIS — R471 Dysarthria and anarthria: Secondary | ICD-10-CM | POA: Diagnosis present

## 2019-04-20 DIAGNOSIS — M6281 Muscle weakness (generalized): Secondary | ICD-10-CM | POA: Diagnosis present

## 2019-04-20 DIAGNOSIS — R41841 Cognitive communication deficit: Secondary | ICD-10-CM | POA: Diagnosis present

## 2019-04-20 DIAGNOSIS — C712 Malignant neoplasm of temporal lobe: Secondary | ICD-10-CM

## 2019-04-20 DIAGNOSIS — R482 Apraxia: Secondary | ICD-10-CM | POA: Diagnosis present

## 2019-04-20 DIAGNOSIS — G8191 Hemiplegia, unspecified affecting right dominant side: Secondary | ICD-10-CM | POA: Diagnosis not present

## 2019-04-20 DIAGNOSIS — R2681 Unsteadiness on feet: Secondary | ICD-10-CM | POA: Insufficient documentation

## 2019-04-20 DIAGNOSIS — R4701 Aphasia: Secondary | ICD-10-CM | POA: Diagnosis present

## 2019-04-20 DIAGNOSIS — Z79899 Other long term (current) drug therapy: Secondary | ICD-10-CM | POA: Insufficient documentation

## 2019-04-20 DIAGNOSIS — R2689 Other abnormalities of gait and mobility: Secondary | ICD-10-CM | POA: Insufficient documentation

## 2019-04-20 DIAGNOSIS — Z9221 Personal history of antineoplastic chemotherapy: Secondary | ICD-10-CM | POA: Diagnosis not present

## 2019-04-20 LAB — CBC WITH DIFFERENTIAL (CANCER CENTER ONLY)
Abs Immature Granulocytes: 0.01 10*3/uL (ref 0.00–0.07)
Basophils Absolute: 0 10*3/uL (ref 0.0–0.1)
Basophils Relative: 0 %
Eosinophils Absolute: 0 10*3/uL (ref 0.0–0.5)
Eosinophils Relative: 1 %
HCT: 32.5 % — ABNORMAL LOW (ref 39.0–52.0)
Hemoglobin: 10.9 g/dL — ABNORMAL LOW (ref 13.0–17.0)
Immature Granulocytes: 0 %
Lymphocytes Relative: 17 %
Lymphs Abs: 0.6 10*3/uL — ABNORMAL LOW (ref 0.7–4.0)
MCH: 31.1 pg (ref 26.0–34.0)
MCHC: 33.5 g/dL (ref 30.0–36.0)
MCV: 92.6 fL (ref 80.0–100.0)
Monocytes Absolute: 0.3 10*3/uL (ref 0.1–1.0)
Monocytes Relative: 7 %
Neutro Abs: 2.6 10*3/uL (ref 1.7–7.7)
Neutrophils Relative %: 75 %
Platelet Count: 81 10*3/uL — ABNORMAL LOW (ref 150–400)
RBC: 3.51 MIL/uL — ABNORMAL LOW (ref 4.22–5.81)
RDW: 15.3 % (ref 11.5–15.5)
WBC Count: 3.5 10*3/uL — ABNORMAL LOW (ref 4.0–10.5)
nRBC: 0 % (ref 0.0–0.2)

## 2019-04-20 LAB — CMP (CANCER CENTER ONLY)
ALT: 13 U/L (ref 0–44)
AST: 13 U/L — ABNORMAL LOW (ref 15–41)
Albumin: 3.8 g/dL (ref 3.5–5.0)
Alkaline Phosphatase: 63 U/L (ref 38–126)
Anion gap: 12 (ref 5–15)
BUN: 9 mg/dL (ref 6–20)
CO2: 26 mmol/L (ref 22–32)
Calcium: 9.5 mg/dL (ref 8.9–10.3)
Chloride: 107 mmol/L (ref 98–111)
Creatinine: 0.85 mg/dL (ref 0.61–1.24)
GFR, Est AFR Am: >60 mL/min (ref >60)
GFR, Estimated: >60 mL/min (ref >60)
Glucose, Bld: 96 mg/dL (ref 70–99)
Potassium: 4 mmol/L (ref 3.5–5.1)
Sodium: 145 mmol/L (ref 135–145)
Total Bilirubin: 0.4 mg/dL (ref 0.3–1.2)
Total Protein: 7.9 g/dL (ref 6.5–8.1)

## 2019-04-20 NOTE — Therapy (Signed)
[facility] [address] [place], [place], [address] Phone: [phone]   Fax:  [phone]  Occupational Therapy Treatment  Patient Details  Name: Gregory Moreno MRN: [id number] Date of Birth: 02/15/1995 Referring Provider (OT): Marlowe Shores   Encounter Date: 04/20/2019  OT End of Session - 04/20/19 1852    Visit Number  32    Number of Visits  41    Date for OT Re-Evaluation  05/17/19    Authorization Type  No VL - No auth required    OT Start Time  1502    OT Stop Time  1546    OT Time Calculation (min)  44 min    Activity Tolerance  Patient tolerated treatment well       History reviewed. No pertinent past medical history.  Past Surgical History:  Procedure Laterality Date  . CRANIOTOMY Left 10/13/2018   Procedure: LEFT CRANIOTOMY FOR TUMOR RESECTION;  Surgeon: Judith Part, MD;  Location: [facility];  Service: Neurosurgery;  Laterality: Left;    There were no vitals filed for this visit.  Subjective Assessment - 04/20/19 1847    Subjective   I am a little nervous about the water therapy but I love the water    Patient is accompanied by:  Family member   mom   Pertinent History  Glioblastoma    Currently in Pain?  No/denies       Patient seen for aquatic therapy today.  Treatment took place in water 2.5-4 feet deep depending upon activity.  Pt entered the pool via steps using 1 hand railing and mod assist. Utilized air cast for R ankle protection.  Prior to entering the water, began basic review of simple water principles with pt and mom in order for pt and mom to be able to implement later for aquatic HEP.  Pt and mom indicate that [facility] if the pt wishes to at d/c from aquatic therapy.  Will continue to provide ongoing education related to this.  Allowed pt to initially adjust to challenges of moving in water vs on land - pt initially had significant difficulty activating RLE and propelling  forward in the water. Pt also initially very challenged from balance standpoint with movement in the water.  Performance slowly improved throughout the entire session.  Pt with decreased sensation and apraxia which impacts sustained muscle activity.  Focused on increasing demand on RLE initially while also increasing demand for improved postural alignment and control first in static standing in midline then progressing to single leg stance on RLE with mod - min facilitation and support.  Pt needed mod facilitation for initiation and sustained activation at R hip (into extension) as well as knee extension.  Performance improves with significant repetition.  Progressed to lateral anterior weight shift moving from LLE onto RLE with improved activation. Progressed to weight shifting in modified tandem to stepping forward and backward with LLE while maintaining control of RLE (pt needed mod facilitation and min a as well as light LUE stabilization).  Utilized forward stepping and backward stepping with light LUE support on pool wall.  Transitioned into semi squat against pool wall to address first active relaxing of RUE then progressing to initiation of isolated elbow flexion/extenion using the water for RUE limb support to decrease overexertion. Pt was able to initiate both directions of movement with mod to min facilitation. Pt exited the pool via steps using 1 hand railing and mod a  for improved alignment and assist.                          OT Short Term Goals - 03/31/19 1625      OT SHORT TERM GOAL #6   Title  Patient will don/doff underwear, pants, and left shoe with min assist    Status  Achieved      OT SHORT TERM GOAL #7   Title  Patient will complete shower transfer with supervision    Status  Achieved      OT [place] #8   Title  Patient will bathe self in shower with supervision and cueing assistance    Status  Achieved        OT Long Term Goals - 04/15/19 1750       OT LONG TERM GOAL #2   Title  Patient will shower with supervision    Status  On-going   Mom still assist with pouring shampoo into hand and washing bottom - discussed getting pump type shampoo bottle     OT LONG TERM GOAL #3   Title  Patient will cut food on plate with modified independence    Status  Achieved      OT LONG TERM GOAL #4   Title  Patient will demonstrate ability to retrieve a lightweight item (less than 2lb) from waist height or lower and transport 6-12 inches laterally with RUE    Status  On-going      OT LONG TERM GOAL #5   Title  Patient will demonstrate RUE  mid level reach with minimal LUE support to hit a target at chest height directly in front of body    Status  On-going      Long Term Additional Goals   Additional Long Term Goals  Yes      OT LONG TERM GOAL #6   Title  Patient will demonstrate adequate postural control in standing to reach for an object outside of his base of support (right) at waist height or lower with right hand and min assistance without significant loss of balance.    Time  8    Period  Weeks    Status  On-going            Plan - 04/20/19 1850    Clinical Impression Statement  Pt began aquatic therapy today. Mom and pt indicate that they can join the [facility] if the pt wants to at a later date.    OT Occupational Profile and History  Comprehensive Assessment- Review of records and extensive additional review of physical, cognitive, psychosocial history related to current functional performance    Occupational performance deficits (Please refer to evaluation for details):  ADL's;IADL's;Rest and Sleep;Education;Work;Play;Leisure;Social Participation    Body Structure / Function / Physical Skills  ADL;Decreased knowledge of use of DME;Gait;Strength;Balance;Dexterity;GMC;Tone;Body mechanics;Hearing;Proprioception;UE functional use;Endurance;IADL;ROM;Vestibular;Vision;Coordination;Flexibility;Mobility;Sensation;FMC;Decreased knowledge of  precautions    Cognitive Skills  Attention;Problem Solve;Emotional;Safety Awareness;Sequencing;Memory;Learn;Thought;Understand;Perception    Psychosocial Skills  Routines and Behaviors;Interpersonal Interaction    Rehab Potential  Good    Clinical Decision Making  Multiple treatment options, significant modification of task necessary    Comorbidities Affecting Occupational Performance:  May have comorbidities impacting occupational performance    Modification or Assistance to Complete Evaluation   Min-Moderate modification of tasks or assist with assess necessary to complete eval    OT Frequency  2x / week    OT Duration  8 weeks  OT Treatment/Interventions  Self-care/ADL training;DME and/or AE instruction;Splinting;Balance training;Aquatic Therapy;Therapeutic activities;Therapeutic exercise;Cognitive remediation/compensation;Neuromuscular education;Functional Mobility Training;Visual/perceptual remediation/compensation;Electrical Stimulation;Manual Therapy;Patient/family education    Plan  Postural control, more consistent activation in right leg in standing, right arm to work out of modified closed chain toward partially open chain. aquatic therapy to address postural alignment and control, NMR for R side    Consulted and Agree with Plan of Care  Patient;Family member/caregiver    Family Member Consulted  mom       Patient will benefit from skilled therapeutic intervention in order to improve the following deficits and impairments:   Body Structure / Function / Physical Skills: ADL, Decreased knowledge of use of DME, Gait, Strength, Balance, Dexterity, GMC, Tone, Body mechanics, Hearing, Proprioception, UE functional use, Endurance, IADL, ROM, Vestibular, Vision, Coordination, Flexibility, Mobility, Sensation, FMC, Decreased knowledge of precautions Cognitive Skills: Attention, Problem Solve, Emotional, Safety Awareness, Sequencing, Memory, Learn, Thought, Understand, Perception Psychosocial  Skills: Routines and Behaviors, Interpersonal Interaction   Visit Diagnosis: Hemiplegia affecting right dominant side, unspecified etiology, unspecified hemiplegia type (HCC)  Unsteadiness on feet  Muscle weakness (generalized)  Other symptoms and signs involving the nervous system  Apraxia  Visuospatial deficit    Problem List Patient Active Problem List   Diagnosis Date Noted  . Goals of care, counseling/discussion 12/16/2018  . Palliative care by specialist   . 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 Spastic hemiparesis ([place])   . Cerebral edema (HCC)   . Intracranial tumor ([place])   . Steroid-induced hyperglycemia   . Spastic hemiplegia affecting nondominant side ([place])   . Hyponatremia   . Transaminitis   . Leucocytosis   . Right spastic hemiplegia ([place]) 10/21/2018  . Dysphagia 10/21/2018  . 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 Brain mass   . ICH (intracerebral hemorrhage) ([place]) 10/13/2018    Quay Burow, OTR/L 04/20/2019, 6:53 PM  [facility] [address] [place], [place], [address] Phone: [phone]   Fax:  [phone]  Name: CLAYBON KOCIK MRN: [id number] Date of Birth: 1995/04/06

## 2019-04-22 ENCOUNTER — Other Ambulatory Visit: Payer: Self-pay | Admitting: Radiation Therapy

## 2019-04-22 ENCOUNTER — Ambulatory Visit: Payer: 59 | Admitting: Physical Therapy

## 2019-04-22 ENCOUNTER — Ambulatory Visit: Payer: 59

## 2019-04-22 ENCOUNTER — Ambulatory Visit: Payer: 59 | Admitting: Occupational Therapy

## 2019-04-22 ENCOUNTER — Encounter: Payer: Self-pay | Admitting: Occupational Therapy

## 2019-04-22 ENCOUNTER — Other Ambulatory Visit: Payer: Self-pay

## 2019-04-22 DIAGNOSIS — R482 Apraxia: Secondary | ICD-10-CM

## 2019-04-22 DIAGNOSIS — M6281 Muscle weakness (generalized): Secondary | ICD-10-CM

## 2019-04-22 DIAGNOSIS — R471 Dysarthria and anarthria: Secondary | ICD-10-CM

## 2019-04-22 DIAGNOSIS — R4701 Aphasia: Secondary | ICD-10-CM

## 2019-04-22 DIAGNOSIS — R29818 Other symptoms and signs involving the nervous system: Secondary | ICD-10-CM

## 2019-04-22 DIAGNOSIS — R41842 Visuospatial deficit: Secondary | ICD-10-CM

## 2019-04-22 DIAGNOSIS — R2681 Unsteadiness on feet: Secondary | ICD-10-CM

## 2019-04-22 DIAGNOSIS — G8191 Hemiplegia, unspecified affecting right dominant side: Secondary | ICD-10-CM | POA: Diagnosis not present

## 2019-04-22 DIAGNOSIS — R2689 Other abnormalities of gait and mobility: Secondary | ICD-10-CM

## 2019-04-22 DIAGNOSIS — R41841 Cognitive communication deficit: Secondary | ICD-10-CM

## 2019-04-22 NOTE — Therapy (Signed)
[facility] [address] [place], [place], [address] Phone: [phone]   Fax:  [phone]  Occupational Therapy Treatment  Patient Details  Name: Gregory Moreno MRN: [id number] Date of Birth: 1995-04-25 Referring Provider (OT): Marlowe Shores   Encounter Date: 04/22/2019  OT End of Session - 04/22/19 1708    Visit Number  33    Number of Visits  41    Date for OT Re-Evaluation  05/17/19    Authorization Type  No VL - No auth required    OT Start Time  1617    OT Stop Time  1700    OT Time Calculation (min)  43 min    Activity Tolerance  Patient tolerated treatment well    Behavior During Therapy  [facility] for tasks assessed/performed       History reviewed. No pertinent past medical history.  Past Surgical History:  Procedure Laterality Date  . CRANIOTOMY Left 10/13/2018   Procedure: LEFT CRANIOTOMY FOR TUMOR RESECTION;  Surgeon: Judith Part, MD;  Location: [facility];  Service: Neurosurgery;  Laterality: Left;    There were no vitals filed for this visit.  Subjective Assessment - 04/22/19 1626    Subjective   Patient indicates he loved pool therapy    Patient is accompanied by:  Family member    Currently in Pain?  No/denies    Pain Score  0-No pain                   OT Treatments/Exercises (OP) - 04/22/19 0001      Neurological Re-education Exercises   Other Exercises 1  Neuromuscular reeducation to address postural control in standing - working for more active control of right hip and knee and more thoracic extension to allow right arm to hang at side versus at front of body.  Once aligned and active in standing, patient able to flex shoulder fourward and extend right elbow!  Guiuded assist - modified closed chain.  Worked in progressively challenging dynamic standing conditions, and increasing activity demand through RUE.      Other Exercises 2  Prone on forearms to activate scapula stabilizers  without over use of cervical extension/capital extension.  Patient able to begin to transitin form prone on forarms to sidelying, and almost up to quadruped.                 OT Short Term Goals - 03/31/19 1625      OT SHORT TERM GOAL #6   Title  Patient will don/doff underwear, pants, and left shoe with min assist    Status  Achieved      OT SHORT TERM GOAL #7   Title  Patient will complete shower transfer with supervision    Status  Achieved      OT SHORT TERM GOAL #8   Title  Patient will bathe self in shower with supervision and cueing assistance    Status  Achieved        OT Long Term Goals - 04/22/19 1710      OT LONG TERM GOAL #2   Title  Patient will shower with supervision    Status  Achieved            Plan - 04/22/19 1708    Clinical Impression Statement  Patient will work now with both aqautic OT and land based OT to improve postural control and use of right limbs    OT Frequency  2x /  week    OT Duration  8 weeks    OT Treatment/Interventions  Self-care/ADL training;DME and/or AE instruction;Splinting;Balance training;Aquatic Therapy;Therapeutic activities;Therapeutic exercise;Cognitive remediation/compensation;Neuromuscular education;Functional Mobility Training;Visual/perceptual remediation/compensation;Electrical Stimulation;Manual Therapy;Patient/family education    Plan  Postural control, more consistent activation in right leg in standing, right arm to work out of modified closed chain toward partially open chain. aquatic therapy to address postural alignment and control, NMR for R side    Consulted and Agree with Plan of Care  Patient;Family member/caregiver    Family Member Consulted  mom       Patient will benefit from skilled therapeutic intervention in order to improve the following deficits and impairments:           Visit Diagnosis: Hemiplegia affecting right dominant side, unspecified etiology, unspecified hemiplegia type  ([place])  Apraxia  Unsteadiness on feet  Muscle weakness (generalized)  Other symptoms and signs involving the nervous system  Visuospatial deficit    Problem List Patient Active Problem List   Diagnosis Date Noted  . Goals of care, counseling/discussion 12/16/2018  . Palliative care by specialist   . 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 Spastic hemiparesis ([place])   . Cerebral edema (HCC)   . Intracranial tumor ([place])   . Steroid-induced hyperglycemia   . Spastic hemiplegia affecting nondominant side ([place])   . Hyponatremia   . Transaminitis   . Leucocytosis   . Right spastic hemiplegia ([place]) 10/21/2018  . Dysphagia 10/21/2018  . 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 Brain mass   . ICH (intracerebral hemorrhage) ([place]) 10/13/2018    Mariah Milling, OTR/L 04/22/2019, 5:14 PM  [facility] [address] [place], [place], [address] Phone: [phone]   Fax:  [phone]  Name: Gregory Moreno MRN: [id number] Date of Birth: 03/19/1995

## 2019-04-22 NOTE — Therapy (Signed)
[facility] [address] [place], [place], [address] Phone: [phone]   Fax:  [phone]  Speech Language Pathology Treatment  Patient Details  Name: Gregory Moreno MRN: [id number] Date of Birth: Apr 20, 1995 Referring Provider (SLP): Alger Simons, MD   Encounter Date: 04/22/2019  End of Session - 04/22/19 1716    Visit Number  30    Number of Visits  41    Date for SLP Re-Evaluation  05/25/19    SLP Start Time  1448    SLP Stop Time   1660    SLP Time Calculation (min)  42 min    Activity Tolerance  Patient tolerated treatment well       History reviewed. No pertinent past medical history.  Past Surgical History:  Procedure Laterality Date  . CRANIOTOMY Left 10/13/2018   Procedure: LEFT CRANIOTOMY FOR TUMOR RESECTION;  Surgeon: Judith Part, MD;  Location: [facility];  Service: Neurosurgery;  Laterality: Left;    There were no vitals filed for this visit.  Subjective Assessment - 04/22/19 1456    Subjective  "How have you been lately?"    Patient is accompained by:  --   Mother   Currently in Pain?  No/denies            ADULT SLP TREATMENT - 04/22/19 1459      General Information   Behavior/Cognition  Cooperative;Requires cueing;Alert;Decreased sustained attention      Treatment Provided   Treatment provided  Cognitive-Linquistic      Cognitive-Linquistic Treatment   Treatment focused on  Apraxia;Aphasia;Cognition    Skilled Treatment  SLP engaged pt in simple conversation with support for anomia using phonemic and written cues; pt with varied success but generally better success with written word cues (revealed letter by letter). Divergent naming for items in his bedroom with usual mod cues. Occasionally pt would verbalize 4-5 words in a sentence and then stop and could not continue - pt would not use any compensatory strategies.       Assessment / Recommendations / Plan   Plan  Continue  with current plan of care      Progression Toward Goals   Progression toward goals  Progressing toward goals         SLP Short Term Goals - 04/08/19 1713      SLP SHORT TERM GOAL #1   Title  pt will complete aphasia assessment of auditory comprehension and verbal expression within 1-2 sessions    Status  Achieved      SLP SHORT TERM GOAL #2   Title  pt will demo understanding of simple commands (spoken) with 70% and occasional min cues over three sessions    Time  3    Period  Weeks    Status  On-going      SLP SHORT TERM GOAL #3   Title  pt will answer pertinent questions with at least 3 words    Status  Not Met      SLP SHORT TERM GOAL #4   Title  pt will demo sustained/selective attention necessary to complete efficacious speech therapy in 6 sessions    Baseline  04-07-19    Time  3    Period  Weeks   or 8 sessions   Status  On-going       SLP Long Term Goals - 04/22/19 1717      SLP LONG TERM GOAL #1   Title  pt will demo  understanding of commands (spoken) with 85% and rare min cues over three sessions    Baseline  02-06-19    Time  4    Period  Weeks   or 17 sessions, for all LTGs   Status  On-going      SLP LONG TERM GOAL #2   Title  pt will answer questions with [facility] verbal responses 75% of the time, in 3 sessions    Baseline  01-27-19    Time  4    Period  Weeks    Status  On-going      SLP LONG TERM GOAL #3   Title  pt will sustain loud /a/ with average mid 70s dB over 4 sessions    Time  5    Period  Weeks    Status  Deferred   due to focus on language     SLP LONG TERM GOAL #4   Title  pt will engage in simple conversation for 5 minutes with average loudness mid 60s dB over 3 sessions    Time  5    Period  Weeks    Status  Deferred   due to focus on language     SLP LONG TERM GOAL #5   Title  pt will generate 2 subjects and 3 objects for a given transitive verb with occasional min-mod A    Time  4    Period  Weeks    Status  Revised        Plan - 04/22/19 1716    Clinical Impression Statement  Pt with mod Broca's aphasia, dysarthria/voice, and cognitive communication deficits. Pt also exhibits s/sx verbal apraxia. Pt cont to require consistent SLP cues for verbal communication, pt better with written cues. However today in short spurts with consistent mod cues and some flexibility with topic, SLP cues pt able to hold conversation with SLP. See "skilled intervention" for more details on today's session. Pt would cont to benefit from skilled ST focusing on primarily receptive and expressive language but also at some point in rehab process focus on speech loudness and cognitive communication skills. Pt cont to require skilled ST focusing primarily on language skills.    Speech Therapy Frequency  2x / week    Duration  --   8 weeks or 41 total visits - see "clinicial impression" for more details   Treatment/Interventions  Environmental controls;Functional tasks;Compensatory techniques;Multimodal communcation approach;SLP instruction and feedback;Cueing hierarchy;Language facilitation;Cognitive reorganization;Internal/external aids;Patient/family education    Potential to Achieve Goals  Good    Potential Considerations  Co-morbidities;Ability to learn/carryover information;Severity of impairments;Other (comment)       Patient will benefit from skilled therapeutic intervention in order to improve the following deficits and impairments:   Aphasia  Verbal apraxia  Dysarthria and anarthria  Cognitive communication deficit    Problem List Patient Active Problem List   Diagnosis Date Noted  . Goals of care, counseling/discussion 12/16/2018  . Palliative care by specialist   . 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 Spastic hemiparesis ([facility])   . Cerebral edema (HCC)   . Intracranial tumor ([place])   . Steroid-induced hyperglycemia   . Spastic hemiplegia affecting nondominant side ([facility])   . Hyponatremia   .  Transaminitis   . Leucocytosis   . Right spastic hemiplegia ([place]) 10/21/2018  . Dysphagia 10/21/2018  . 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 Brain mass   . ICH (intracerebral hemorrhage) ([facility] [place]) 10/13/2018    , ,  MS, CCC-SLP  04/22/2019, 5:18 PM  [facility] [address] [place], [place], [address] Phone: [phone]   Fax:  [phone]   Name: DRACEN REIGLE MRN: [id number] Date of Birth: 03/06/1995

## 2019-04-23 ENCOUNTER — Telehealth: Payer: Self-pay | Admitting: Internal Medicine

## 2019-04-23 NOTE — Telephone Encounter (Signed)
Scheduled apt per 4/7 sch message- pt mother aware of apt

## 2019-04-24 NOTE — Therapy (Signed)
[facility] [address] [place], [place], [address] Phone: [phone]   Fax:  [phone]  Physical Therapy Treatment  Patient Details  Name: Gregory Moreno MRN: [id number] Date of Birth: [birth year] Referring Provider (PT): Alger Simons, MD   Encounter Date: 04/22/2019  PT End of Session - 04/24/19 0805    Visit Number  32    Number of Visits  40    Date for PT Re-Evaluation  05/16/19    Authorization Type  [employer] - 2021, no VL, $40 copay per therapy per day    PT Start Time  1533    PT Stop Time  1618    PT Time Calculation (min)  45 min    Equipment Utilized During Treatment  Gait belt    Activity Tolerance  Patient tolerated treatment well    Behavior During Therapy  [facility] for tasks assessed/performed       No past medical history on file.  Past Surgical History:  Procedure Laterality Date  . CRANIOTOMY Left 10/13/2018   Procedure: LEFT CRANIOTOMY FOR TUMOR RESECTION;  Surgeon: Judith Part, MD;  Location: [facility];  Service: Neurosurgery;  Laterality: Left;    There were no vitals filed for this visit.         04/22/19 1538  Symptoms/Limitations  Subjective had pool therapy and it went really well. Doing a lot of walking at home now that the weather is super nice. No falls.  Limitations Sitting;Walking;Standing  How long can you walk comfortably? 100' with hemiwalker  Patient Stated Goals wants to improve his walking, get back to normal  Pain Assessment  Currently in Pain? No/denies                [facility] Adult PT Treatment/Exercise - 04/24/19 0001      Ambulation/Gait   Ambulation/Gait  Yes    Ambulation/Gait Assistance  4: Min guard    Ambulation/Gait Assistance Details  Due to pt's increased forceful R genu recurvatum during gait and continued whip, therapist wanted to trial another AFO - tried R ottobock PLS AFO for 200' of gait with pt demonstrating decr R genu recurvatum and  improvement in foot clearance with no episodes of a whip on RLE. Pt also demonstrating improved trunk and head alignment with this brace. Had pt sign video release form and had PT tech video tape pt's gait with R ottobock PLS AFO as well as videotape pt's current anterior SpryStep Max AFO. Pt subjectively reporting the ottobock PLS AFO feels better and states his walking also looked so much better after viewing the video. Therapist discussing with pt and pt's mom about reaching out to [employer] and inquiring about pt's insurance about the potential of pt needing a new brace due to his progress and increased strength.     Ambulation Distance (Feet)  300 Feet   approx. throughout session   Assistive device  Small based quad cane   R AFO   Gait Pattern  Decreased stance time - right;Decreased step length - left;Decreased weight shift to right;Decreased hip/knee flexion - right;Right genu recurvatum;Trunk flexed;Narrow base of support;Poor foot clearance - right;Step-through pattern;Decreased dorsiflexion - right    Ambulation Surface  Level;Indoor      Neuro Re-ed    Neuro Re-ed Details   In tall kneeling positon on mat table with UE support on Kaye Bench: needing min A for stand step transfer to turn to mat table and to get RLE onto mat table.  In position: cues for posture and glute activation, 1 x 10 reps LLE side steps out and in for weight shifting, weight bearing, and hip ABD activation on RLE, needing tactile and verbal cues for proper alignment. With assist of PT tech providing resistance, had green theraband around pt's ASIS for resisted hip extension with tall kneeling mini squats, verbal and tactile cues for glute activation. Turning to face mirror for use of visual cue for alignment (with just single UE support on bench) RLE hip flexion/extension x10 reps, needing one episode of min A to maintain balance from almost losing it anteriorly.                PT Short Term Goals - 04/15/19 1623       PT SHORT TERM GOAL #1   Title  Pt will ambulate at least 115' with supervision with small base quad cane in order to improve household mobility. ALL STGS DUE 04/14/19    Baseline  met on 04/15/19    Time  4    Period  Weeks    Status  Achieved    Target Date  04/14/19      PT SHORT TERM GOAL #2   Title  Patient will undergo further assessment of gait speed with LRAD - goal to be written as appropriate.    Baseline  .97 ft/sec on 3/331/21    Period  Weeks    Status  Achieved      PT SHORT TERM GOAL #3   Title  Pt will perform at least 5 sit <> stands from mat table with proper technique with no cueing and mod I in order to improve functional transfers and demo improved LE strength.    Baseline  throughout session, partially met, needing intermittent verbal cues    Time  4    Period  Weeks    Status  Partially Met      PT SHORT TERM GOAL #4   Title  Pt will perform all bed mobility with supervision in order to decrease caregiver burden.    Baseline  not performed on today's session    Time  4    Period  Weeks    Status  Deferred      PT SHORT TERM GOAL #5   Title  Patient will perform at least 4 steps using single handrail and step to pattern with min guard in order to safely enter/exit the RV.    Baseline  not performed during today's session    Time  4    Period  Weeks    Status  Deferred        PT Long Term Goals - 03/17/19 2026      PT LONG TERM GOAL #1   Title  Patient and pt's family will be independent with final HEP designed to improve movement and strength in RLE.  ALL LTGS DUE 05/12/19    Baseline  on-going    Time  8    Period  Weeks    Status  New    Target Date  05/12/19      PT LONG TERM GOAL #2   Title  Pt will ambulate at least 200' over level indoor and outdoor paved surfaces with supervision in order to improve functional mobility with small base quad cane vs. LRAD    Time  8    Period  Weeks    Status  New      PT LONG TERM GOAL #3  Title   Pt will perform at least 10 sit <> stands from mat table with proper technique with no cueing and mod I in order to improve functional transfers and demo improved LE strength.    Time  8    Period  Weeks    Status  On-going      PT LONG TERM GOAL #4   Title  Pt will perform all bed mobility from level mat table with mod I in order to decrease caregiver burden.    Time  12    Period  Weeks    Status  On-going      PT LONG TERM GOAL #5   Title  Patient will perform at least 4 steps using single handrail and step to pattern with supervision in order to safely enter/exit the RV.    Baseline  8 steps using single handrail and step to pattern with min A ascending/descending    Time  8    Period  Weeks    Status  Achieved      PT LONG TERM GOAL #6   Title  Pt will perform a curb with min guard in order to improve community mobility and independence.    Time  8    Period  Weeks    Status  New            Plan - 04/24/19 0814    Clinical Impression Statement  Trialed R ottobock AFO during gait vs. pt's current anterior [place] SpryStep Max AFO due to pt continuing to have forceful R genu recurvatum. Pt demonstrating decr genu recurvatum as well as improved foot clearance, trunk alignment, and no whip while ambulating. Pt also subjectively reporting the PLS brace felt much better. Therapist to reach out to [employer] to discuss a potential change in brace due to pt's progress. Remainder of session focused on NMR in tall kneeling for weight shifting and strengthening, pt able to tolerate well and demonstrating improved alignment and glute activation. Will continue to progress towards LTGs.    Personal Factors and Comorbidities  --   pt ungergoing chemo/radiation   Examination-Activity Limitations  Bed Mobility;Locomotion Level;Sit;Squat;Stairs;Stand;Transfers    Examination-Participation Restrictions  Community Activity;School;Driving    Stability/Clinical Decision Making  Evolving/Moderate  complexity    Rehab Potential  Good    PT Frequency  2x / week    PT Duration  8 weeks    PT Treatment/Interventions  ADLs/Self Care Home Management;Therapeutic exercise;Therapeutic activities;Functional mobility training;Stair training;Gait training;DME Instruction;Balance training;Neuromuscular re-education;Patient/family education;Orthotic Fit/Training;Wheelchair mobility training;Energy conservation;Passive range of motion    PT Next Visit Plan  continue to trial R ottobock AFO with gait, curious to see how he would do with this outdoors too. tall kneeling/floor recovery transfers. dynamic standing balance with decr UE support with weight shifitng towards RLE. stair/curb training.    PT Home Exercise Plan  [id number]    Consulted and Agree with Plan of Care  Patient;Family member/caregiver    Family Member Consulted  dad, Davone       Patient will benefit from skilled therapeutic intervention in order to improve the following deficits and impairments:  Abnormal gait, Decreased activity tolerance, Decreased balance, Decreased cognition, Decreased coordination, Decreased safety awareness, Decreased range of motion, Decreased mobility, Difficulty walking, Decreased strength, Decreased endurance, Impaired tone  Visit Diagnosis: Unsteadiness on feet  Muscle weakness (generalized)  Other symptoms and signs involving the nervous system  Other abnormalities of gait and mobility     Problem List Patient Active  Problem List   Diagnosis Date Noted  . Goals of care, counseling/discussion 12/16/2018  . Palliative care by specialist   . 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 Spastic hemiparesis ([place])   . Cerebral edema (HCC)   . Intracranial tumor ([place])   . Steroid-induced hyperglycemia   . Spastic hemiplegia affecting nondominant side ([place])   . Hyponatremia   . Transaminitis   . Leucocytosis   . Right spastic hemiplegia ([facility]) 10/21/2018  . Dysphagia 10/21/2018  .  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 Brain mass   . ICH (intracerebral hemorrhage) ([place]) 10/13/2018    Arliss Journey, PT, DPT  04/24/2019, 8:18 AM  [facility] [address] [place], [place], [address] Phone: [phone]   Fax:  [phone]  Name: Gregory Moreno MRN: [id number] Date of Birth: 1995/10/07

## 2019-04-27 ENCOUNTER — Ambulatory Visit: Payer: 59 | Admitting: Physical Therapy

## 2019-04-27 ENCOUNTER — Other Ambulatory Visit: Payer: Self-pay

## 2019-04-27 ENCOUNTER — Other Ambulatory Visit: Payer: Self-pay | Admitting: Internal Medicine

## 2019-04-27 ENCOUNTER — Encounter: Payer: Self-pay | Admitting: Physical Therapy

## 2019-04-27 ENCOUNTER — Ambulatory Visit: Payer: 59 | Admitting: Occupational Therapy

## 2019-04-27 ENCOUNTER — Encounter: Payer: Self-pay | Admitting: Occupational Therapy

## 2019-04-27 ENCOUNTER — Ambulatory Visit: Payer: 59

## 2019-04-27 ENCOUNTER — Inpatient Hospital Stay: Payer: 59

## 2019-04-27 DIAGNOSIS — G8191 Hemiplegia, unspecified affecting right dominant side: Secondary | ICD-10-CM | POA: Diagnosis not present

## 2019-04-27 DIAGNOSIS — R2681 Unsteadiness on feet: Secondary | ICD-10-CM

## 2019-04-27 DIAGNOSIS — R2689 Other abnormalities of gait and mobility: Secondary | ICD-10-CM

## 2019-04-27 DIAGNOSIS — R471 Dysarthria and anarthria: Secondary | ICD-10-CM

## 2019-04-27 DIAGNOSIS — C712 Malignant neoplasm of temporal lobe: Secondary | ICD-10-CM | POA: Diagnosis not present

## 2019-04-27 DIAGNOSIS — R482 Apraxia: Secondary | ICD-10-CM

## 2019-04-27 DIAGNOSIS — M6281 Muscle weakness (generalized): Secondary | ICD-10-CM

## 2019-04-27 DIAGNOSIS — R29818 Other symptoms and signs involving the nervous system: Secondary | ICD-10-CM

## 2019-04-27 DIAGNOSIS — R4701 Aphasia: Secondary | ICD-10-CM

## 2019-04-27 DIAGNOSIS — R41841 Cognitive communication deficit: Secondary | ICD-10-CM

## 2019-04-27 DIAGNOSIS — R41842 Visuospatial deficit: Secondary | ICD-10-CM

## 2019-04-27 LAB — CBC WITH DIFFERENTIAL (CANCER CENTER ONLY)
Abs Immature Granulocytes: 0.02 10*3/uL (ref 0.00–0.07)
Basophils Absolute: 0 10*3/uL (ref 0.0–0.1)
Basophils Relative: 0 %
Eosinophils Absolute: 0 10*3/uL (ref 0.0–0.5)
Eosinophils Relative: 2 %
HCT: 34.1 % — ABNORMAL LOW (ref 39.0–52.0)
Hemoglobin: 11.3 g/dL — ABNORMAL LOW (ref 13.0–17.0)
Immature Granulocytes: 1 %
Lymphocytes Relative: 24 %
Lymphs Abs: 0.6 10*3/uL — ABNORMAL LOW (ref 0.7–4.0)
MCH: 30.9 pg (ref 26.0–34.0)
MCHC: 33.1 g/dL (ref 30.0–36.0)
MCV: 93.2 fL (ref 80.0–100.0)
Monocytes Absolute: 0.4 10*3/uL (ref 0.1–1.0)
Monocytes Relative: 15 %
Neutro Abs: 1.6 10*3/uL — ABNORMAL LOW (ref 1.7–7.7)
Neutrophils Relative %: 58 %
Platelet Count: 268 10*3/uL (ref 150–400)
RBC: 3.66 MIL/uL — ABNORMAL LOW (ref 4.22–5.81)
RDW: 16.4 % — ABNORMAL HIGH (ref 11.5–15.5)
WBC Count: 2.7 10*3/uL — ABNORMAL LOW (ref 4.0–10.5)
nRBC: 0 % (ref 0.0–0.2)

## 2019-04-27 LAB — CMP (CANCER CENTER ONLY)
ALT: 11 U/L (ref 0–44)
AST: 14 U/L — ABNORMAL LOW (ref 15–41)
Albumin: 3.7 g/dL (ref 3.5–5.0)
Alkaline Phosphatase: 70 U/L (ref 38–126)
Anion gap: 8 (ref 5–15)
BUN: 6 mg/dL (ref 6–20)
CO2: 29 mmol/L (ref 22–32)
Calcium: 9.7 mg/dL (ref 8.9–10.3)
Chloride: 104 mmol/L (ref 98–111)
Creatinine: 0.82 mg/dL (ref 0.61–1.24)
GFR, Est AFR Am: >60 mL/min (ref >60)
GFR, Estimated: >60 mL/min (ref >60)
Glucose, Bld: 78 mg/dL (ref 70–99)
Potassium: 3.8 mmol/L (ref 3.5–5.1)
Sodium: 141 mmol/L (ref 135–145)
Total Bilirubin: 0.4 mg/dL (ref 0.3–1.2)
Total Protein: 7.9 g/dL (ref 6.5–8.1)

## 2019-04-27 NOTE — Therapy (Signed)
[facility] [address] [place], [place], [address] Phone: [phone]   Fax:  [phone]  Speech Language Pathology Treatment  Patient Details  Name: Gregory Moreno MRN: [id number] Date of Birth: [birth year] Referring Provider (SLP): Alger Simons, MD   Encounter Date: 04/27/2019  End of Session - 04/27/19 1641    Visit Number  31    Number of Visits  41    Date for SLP Re-Evaluation  05/25/19    SLP Start Time  1660    SLP Stop Time   1624    SLP Time Calculation (min)  51 min    Activity Tolerance  Patient tolerated treatment well       History reviewed. No pertinent past medical history.  Past Surgical History:  Procedure Laterality Date  . CRANIOTOMY Left 10/13/2018   Procedure: LEFT CRANIOTOMY FOR TUMOR RESECTION;  Surgeon: Judith Part, MD;  Location: [facility];  Service: Neurosurgery;  Laterality: Left;    There were no vitals filed for this visit.  Subjective Assessment - 04/27/19 1600    Subjective  "I saw my grandparents for the first time in 10 months."    Patient is accompained by:  Family member   mother   Currently in Pain?  No/denies            ADULT SLP TREATMENT - 04/27/19 1602      General Information   Behavior/Cognition  Cooperative;Requires cueing;Alert;Decreased sustained attention      Treatment Provided   Treatment provided  Cognitive-Linquistic      Cognitive-Linquistic Treatment   Treatment focused on  Cognition;Aphasia;Apraxia    Skilled Treatment  Cognition 25 minutes: SLP used pt tracking success asking mom or dad for his after dinner meds (Rather than mom/dad giving them to pt). Pt wanted to use "rminders" app for this and will indicate in "notes" section which nights he asked. (speech tx-indiviual): SLP again explained apraxia vs. aphasia descriptions for pt (and mother) to understand - mother had incorrectly attributed apraxia to a language error pt made. SLP  wrote out descriptions of airplane instruments for pt to practice at home. SLP suggested putting them on cards adn having pt go over them in the car.       Assessment / Recommendations / Plan   Plan  Continue with current plan of care      Progression Toward Goals   Progression toward goals  Progressing toward goals       SLP Education - 04/27/19 1640    Education Details  apraxia vs. aphasia descriptions    Person(s) Educated  Patient;Parent(s)    Methods  Explanation    Comprehension  Verbalized understanding       SLP Short Term Goals - 04/27/19 1643      SLP SHORT TERM GOAL #1   Title  pt will complete aphasia assessment of auditory comprehension and verbal expression within 1-2 sessions    Status  Achieved      SLP SHORT TERM GOAL #2   Title  pt will demo understanding of simple commands (spoken) with 70% and occasional min cues over three sessions    Time  2    Period  Weeks    Status  On-going      SLP SHORT TERM GOAL #3   Title  pt will answer pertinent questions with at least 3 words    Status  Not Met      SLP SHORT TERM GOAL #  4   Title  pt will demo sustained/selective attention necessary to complete efficacious speech therapy in 6 sessions    Baseline  04-07-19, 04-27-19    Time  2    Period  Weeks   or 8 sessions   Status  On-going       SLP Long Term Goals - 04/27/19 1643      SLP LONG TERM GOAL #1   Title  pt will demo understanding of commands (spoken) with 85% and rare min cues over three sessions    Baseline  02-06-19    Time  3    Period  Weeks   or 17 sessions, for all LTGs   Status  On-going      SLP LONG TERM GOAL #2   Title  pt will answer questions with [facility] verbal responses 75% of the time, in 3 sessions    Baseline  01-27-19    Time  3    Period  Weeks    Status  On-going      SLP LONG TERM GOAL #3   Title  pt will sustain loud /a/ with average mid 70s dB over 4 sessions    Time  5    Period  Weeks    Status  Deferred   due to  focus on language     SLP LONG TERM GOAL #4   Title  pt will engage in simple conversation for 5 minutes with average loudness mid 60s dB over 3 sessions    Time  5    Period  Weeks    Status  Deferred   due to focus on language     SLP LONG TERM GOAL #5   Title  pt will generate 2 subjects and 3 objects for a given transitive verb with occasional min-mod A    Time  3    Period  Weeks    Status  Revised       Plan - 04/27/19 1642    Clinical Impression Statement  Pt with mod Broca's aphasia, dysarthria/voice, and cognitive communication deficits. Pt also exhibits s/sx verbal apraxia. Pt cont to require consistent SLP cues for verbal communication, pt better with written cues. See "skilled intervention" for more details on today's session. Pt would cont to benefit from skilled ST focusing on primarily receptive and expressive language but also at some point in rehab process focus on speech loudness and cognitive communication skills. Pt cont to require skilled ST focusing primarily on language skills.    Speech Therapy Frequency  2x / week    Duration  --   8 weeks or 41 total visits - see "clinicial impression" for more details   Treatment/Interventions  Environmental controls;Functional tasks;Compensatory techniques;Multimodal communcation approach;SLP instruction and feedback;Cueing hierarchy;Language facilitation;Cognitive reorganization;Internal/external aids;Patient/family education    Potential to Achieve Goals  Good    Potential Considerations  Co-morbidities;Ability to learn/carryover information;Severity of impairments;Other (comment)       Patient will benefit from skilled therapeutic intervention in order to improve the following deficits and impairments:   Cognitive communication deficit  Aphasia  Verbal apraxia  Dysarthria and anarthria    Problem List Patient Active Problem List   Diagnosis Date Noted  . Goals of care, counseling/discussion 12/16/2018  .  Palliative care by specialist   . 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 Spastic hemiparesis ([place])   . Cerebral edema ([facility])   . Intracranial tumor ([place])   . Steroid-induced hyperglycemia   .  Spastic hemiplegia affecting nondominant side ([place])   . Hyponatremia   . Transaminitis   . Leucocytosis   . Right spastic hemiplegia ([place]) 10/21/2018  . Dysphagia 10/21/2018  . 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 Brain mass   . ICH (intracerebral hemorrhage) ([place]) 10/13/2018    [facility] ,MS, [place]  04/27/2019, 4:45 PM  [facility] [address] [place], [place], [address] Phone: [phone]   Fax:  [phone]   Name: Gregory Moreno MRN: [id number] Date of Birth: [birth year]

## 2019-04-27 NOTE — Therapy (Signed)
[facility] [address] [place], [place], [address] Phone: [phone]   Fax:  [phone]  Occupational Therapy Treatment  Patient Details  Name: Gregory Moreno MRN: [id number] Date of Birth: 1995-05-05 Referring Provider (OT): Marlowe Shores   Encounter Date: 04/27/2019  OT End of Session - 04/27/19 1823    Visit Number  34    Number of Visits  41    Date for OT Re-Evaluation  05/17/19    Authorization Type  No VL - No auth required    OT Start Time  1705    OT Stop Time  1800    OT Time Calculation (min)  55 min    Activity Tolerance  Patient tolerated treatment well    Behavior During Therapy  [facility] for tasks assessed/performed       History reviewed. No pertinent past medical history.  Past Surgical History:  Procedure Laterality Date  . CRANIOTOMY Left 10/13/2018   Procedure: LEFT CRANIOTOMY FOR TUMOR RESECTION;  Surgeon: Judith Part, MD;  Location: [facility];  Service: Neurosurgery;  Laterality: Left;    There were no vitals filed for this visit.  Subjective Assessment - 04/27/19 1818    Subjective   Went to the beach!    Patient is accompanied by:  Family member    Currently in Pain?  No/denies    Pain Score  0-No pain                   OT Treatments/Exercises (OP) - 04/27/19 1819      Neurological Re-education Exercises   Other Exercises 1  Neuromuscular reeducation to address postural control in standing and in functional ambulation.  Patient with very strong trunk rotation/ shortening (left) pattern which impeded balance, and confidence when up on feet.  This also limits his ability to allow right arm to hang freely.  Arm draws up with increased tension with higher balance challenges or when percepived off balance.  Worked on improving alignment under dynamic conditions.  Followed with long arm weight bearing in seated position.  Patient better able to activate arm into surface  (active tricep) in closed chain conditions - still difficult for carryover into open chain as needed for functional reach.        Acupuncturist Location  tricep, dorsal forearm    Electrical Stimulation Action  Neuromuscular stimulation    Electrical Stimulation Parameters  Range from 4.5-8    Electrical Stimulation Goals  Neuromuscular facilitation   sensory input              OT Short Term Goals - 03/31/19 1625      OT SHORT TERM GOAL #6   Title  Patient will don/doff underwear, pants, and left shoe with min assist    Status  Achieved      OT SHORT TERM GOAL #7   Title  Patient will complete shower transfer with supervision    Status  Achieved      OT SHORT TERM GOAL #8   Title  Patient will bathe self in shower with supervision and cueing assistance    Status  Achieved        OT Long Term Goals - 04/22/19 1710      OT LONG TERM GOAL #2   Title  Patient will shower with supervision    Status  Achieved            Plan - 04/27/19  1824    Clinical Impression Statement  Patient's should receive UE/LE botox injections tomorrow to help manage high tone.  Patient may also be starting new round of chemo therapy within the next few days.    OT Frequency  2x / week    OT Duration  8 weeks    OT Treatment/Interventions  Self-care/ADL training;DME and/or AE instruction;Splinting;Balance training;Aquatic Therapy;Therapeutic activities;Therapeutic exercise;Cognitive remediation/compensation;Neuromuscular education;Functional Mobility Training;Visual/perceptual remediation/compensation;Electrical Stimulation;Manual Therapy;Patient/family education    Plan  Postural control, more consistent activation in right leg in standing, right arm to work out of modified closed chain toward partially open chain. aquatic therapy to address postural alignment and control, NMR for R side    Consulted and Agree with Plan of Care  Patient;Family  member/caregiver    Family Member Consulted  mom       Patient will benefit from skilled therapeutic intervention in order to improve the following deficits and impairments:           Visit Diagnosis: Hemiplegia affecting right dominant side, unspecified etiology, unspecified hemiplegia type ([place])  Apraxia  Unsteadiness on feet  Muscle weakness (generalized)  Other symptoms and signs involving the nervous system  Visuospatial deficit    Problem List Patient Active Problem List   Diagnosis Date Noted  . Goals of care, counseling/discussion 12/16/2018  . Palliative care by specialist   . 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 Spastic hemiparesis ([place])   . Cerebral edema (HCC)   . Intracranial tumor ([place])   . Steroid-induced hyperglycemia   . Spastic hemiplegia affecting nondominant side ([place])   . Hyponatremia   . Transaminitis   . Leucocytosis   . Right spastic hemiplegia ([place]) 10/21/2018  . Dysphagia 10/21/2018  . 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 Brain mass   . ICH (intracerebral hemorrhage) ([place]) 10/13/2018    Mariah Milling, OTR/L 04/27/2019, 6:26 PM  [facility] [address] [place], [place], [address] Phone: [phone]   Fax:  [phone]  Name: Gregory Moreno MRN: [id number] Date of Birth: Nov 13, 1995

## 2019-04-27 NOTE — Patient Instructions (Signed)
  What instrument do you use to:   Talk to the tower?     Radio    Find your altitude?     Altimeter   Find your speed?     Airspeed Indicator   Find your heading?     Compass   Find if you are climbing or descending?  [employer]

## 2019-04-27 NOTE — Therapy (Signed)
[facility] [address] [place], [place], [address] Phone: [phone]   Fax:  [phone]  Physical Therapy Treatment  Patient Details  Name: Gregory Moreno MRN: [id number] Date of Birth: 1995-08-31 Referring Provider (PT): Alger Simons, MD   Encounter Date: 04/27/2019  PT End of Session - 04/27/19 1855    Visit Number  33    Number of Visits  40    Date for PT Re-Evaluation  05/16/19    Authorization Type  [employer] - 2021, no VL, $40 copay per therapy per day    PT Start Time  1627    PT Stop Time  1705    PT Time Calculation (min)  38 min    Equipment Utilized During Treatment  Gait belt    Activity Tolerance  Patient tolerated treatment well    Behavior During Therapy  [facility] for tasks assessed/performed       History reviewed. No pertinent past medical history.  Past Surgical History:  Procedure Laterality Date  . CRANIOTOMY Left 10/13/2018   Procedure: LEFT CRANIOTOMY FOR TUMOR RESECTION;  Surgeon: Judith Part, MD;  Location: [facility];  Service: Neurosurgery;  Laterality: Left;    There were no vitals filed for this visit.  Subjective Assessment - 04/27/19 1742    Subjective  Gets Botox tomorrow.  Went to [employer] this weekend and walked in the sand!    Limitations  Sitting;Walking;Standing    How long can you walk comfortably?  100' with hemiwalker    Patient Stated Goals  wants to improve his walking, get back to normal    Currently in Pain?  No/denies                       [facility] Adult PT Treatment/Exercise - 04/27/19 1742      Transfers   Transfers  Sit to Stand;Stand to Sit    Sit to Stand  6: Modified independent (Device/Increase time)    Stand to Sit  6: Modified independent (Device/Increase time)      Ambulation/Gait   Ambulation/Gait  Yes    Ambulation/Gait Assistance  4: Min assist    Ambulation/Gait Assistance Details  performed gait with PLS outside over paved  surfaces with cane and therapist providing verbal cues for safety and sequencing on inclines and also providing manual facilitation at R pelvis for increased weight shift to R, downward and anterior pressure through R pelvis for increased proprioception and weight shift over stance LE and to facilitate increased glute med activation; also provided tactile cues at L trunk for elongation due to pt leaning upper body heavily to L on cane.  When facilitation at pelvis added pt demonstrated increased stance time on RLE and decreased genu recurvatum.  Still presented with intermittent toe drag.  Peformed gait over inclines, up/down curb and stepping over large obstacle    Ambulation Distance (Feet)  500 Feet    Assistive device  Small based quad cane    Gait Pattern  Decreased stance time - right;Decreased stride length;Decreased hip/knee flexion - right;Decreased dorsiflexion - right;Decreased weight shift to right;Right genu recurvatum;Poor foot clearance - right    Ambulation Surface  Unlevel;Outdoor;Paved    Curb  4: Min assist    Curb Details (indicate cue type and reason)  with cane and PLS; cues for full R foot clearance and advancement when ascending and descending; pt self selects to descend with RLE but ascends with LLE  Gait Comments  Also performed stepping over large obstacles with pt self selecting leading with LLE and having greater difficulty clearing R foot.  Second step over pt performed leading with RLE with improved clearance but required min A for balance.        Therapeutic Activites    Therapeutic Activities  Other Therapeutic Activities    Other Therapeutic Activities  Continued to perform assessment of most appropriate AFO for patient.  When this therapist donned [place] PLS used last session with good results, today when pt ambulated with PLS he demonstrated worsening of gait technique with increased toe drag, increased genu recurvatum and lateral whip.  Attempted to tighten shoe  around ankle without any benefit.  Changed to [facility] with lateral strut but pt continued to demonstrate same gait impairments and now with heel sliding in and out of shoe due to lateral strut pushing out on shoe.  Added larger heel wedge and T strap with improved lateral stability and continued with gait training over outdoor surfaces.                 PT Short Term Goals - 04/15/19 1623      PT SHORT TERM GOAL #1   Title  Pt will ambulate at least 115' with supervision with small base quad cane in order to improve household mobility. ALL STGS DUE 04/14/19    Baseline  met on 04/15/19    Time  4    Period  Weeks    Status  Achieved    Target Date  04/14/19      PT SHORT TERM GOAL #2   Title  Patient will undergo further assessment of gait speed with LRAD - goal to be written as appropriate.    Baseline  .97 ft/sec on 3/331/21    Period  Weeks    Status  Achieved      PT SHORT TERM GOAL #3   Title  Pt will perform at least 5 sit <> stands from mat table with proper technique with no cueing and mod I in order to improve functional transfers and demo improved LE strength.    Baseline  throughout session, partially met, needing intermittent verbal cues    Time  4    Period  Weeks    Status  Partially Met      PT SHORT TERM GOAL #4   Title  Pt will perform all bed mobility with supervision in order to decrease caregiver burden.    Baseline  not performed on today's session    Time  4    Period  Weeks    Status  Deferred      PT SHORT TERM GOAL #5   Title  Patient will perform at least 4 steps using single handrail and step to pattern with min guard in order to safely enter/exit the RV.    Baseline  not performed during today's session    Time  4    Period  Weeks    Status  Deferred        PT Long Term Goals - 03/17/19 2026      PT LONG TERM GOAL #1   Title  Patient and pt's family will be independent with final HEP designed to improve movement and strength in RLE.   ALL LTGS DUE 05/12/19    Baseline  on-going    Time  8    Period  Weeks    Status  New  Target Date  05/12/19      PT LONG TERM GOAL #2   Title  Pt will ambulate at least 200' over level indoor and outdoor paved surfaces with supervision in order to improve functional mobility with small base quad cane vs. LRAD    Time  8    Period  Weeks    Status  New      PT LONG TERM GOAL #3   Title  Pt will perform at least 10 sit <> stands from mat table with proper technique with no cueing and mod I in order to improve functional transfers and demo improved LE strength.    Time  8    Period  Weeks    Status  On-going      PT LONG TERM GOAL #4   Title  Pt will perform all bed mobility from level mat table with mod I in order to decrease caregiver burden.    Time  12    Period  Weeks    Status  On-going      PT LONG TERM GOAL #5   Title  Patient will perform at least 4 steps using single handrail and step to pattern with supervision in order to safely enter/exit the RV.    Baseline  8 steps using single handrail and step to pattern with min A ascending/descending    Time  8    Period  Weeks    Status  Achieved      PT LONG TERM GOAL #6   Title  Pt will perform a curb with min guard in order to improve community mobility and independence.    Time  8    Period  Weeks    Status  New            Plan - 04/27/19 1857    Clinical Impression Statement  Continued to assess most appropriate AFO for pt at this time.  Greatest improvement in gait demonstrated when pt cued for increased elongation on L side and increased glute med activation on R during R stance.  Performed gait training outside with PLS and cane with pt only requiring min A for curb negotiation, inclines and stepping over obstacles.  Will continue to address in order to progress towards LTG.    Personal Factors and Comorbidities  --   pt ungergoing chemo/radiation   Examination-Activity Limitations  Bed Mobility;Locomotion  Level;Sit;Squat;Stairs;Stand;Transfers    Examination-Participation Restrictions  Community Activity;School;Driving    Stability/Clinical Decision Making  Evolving/Moderate complexity    Rehab Potential  Good    PT Frequency  2x / week    PT Duration  8 weeks    PT Treatment/Interventions  ADLs/Self Care Home Management;Therapeutic exercise;Therapeutic activities;Functional mobility training;Stair training;Gait training;DME Instruction;Balance training;Neuromuscular re-education;Patient/family education;Orthotic Fit/Training;Wheelchair mobility training;Energy conservation;Passive range of motion    PT Next Visit Plan  Gait outside with AFO and cane - curb, stepping over obstacles, grass, pavement etc.  Needs to work on closed chain glute med activation on R.  tall kneeling/floor recovery transfers. dynamic standing balance with decr UE support with weight shifitng towards RLE. stair/curb training.    PT Home Exercise Plan  [id number]    Consulted and Agree with Plan of Care  Patient;Family member/caregiver    Family Member Consulted  dad, Josephmichael       Patient will benefit from skilled therapeutic intervention in order to improve the following deficits and impairments:  Abnormal gait, Decreased activity tolerance, Decreased balance, Decreased cognition, Decreased  coordination, Decreased safety awareness, Decreased range of motion, Decreased mobility, Difficulty walking, Decreased strength, Decreased endurance, Impaired tone  Visit Diagnosis: Muscle weakness (generalized)  Other symptoms and signs involving the nervous system  Other abnormalities of gait and mobility     Problem List Patient Active Problem List   Diagnosis Date Noted  . Goals of care, counseling/discussion 12/16/2018  . Palliative care by specialist   . 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 Spastic hemiparesis ([place])   . Cerebral edema (HCC)   . Intracranial tumor ([place])   . Steroid-induced  hyperglycemia   . Spastic hemiplegia affecting nondominant side ([place])   . Hyponatremia   . Transaminitis   . Leucocytosis   . Right spastic hemiplegia ([place]) 10/21/2018  . Dysphagia 10/21/2018  . 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 Brain mass   . ICH (intracerebral hemorrhage) ([place]) 10/13/2018    Rico Junker, PT, DPT 04/27/19    7:00 PM    [facility] [address] [place], [place], [address] Phone: [phone]   Fax:  [phone]  Name: Gregory Moreno MRN: [id number] Date of Birth: 09/27/1995

## 2019-04-28 ENCOUNTER — Other Ambulatory Visit: Payer: Self-pay | Admitting: Internal Medicine

## 2019-04-28 ENCOUNTER — Encounter: Payer: 59 | Attending: Physical Medicine and Rehabilitation | Admitting: Physical Medicine and Rehabilitation

## 2019-04-28 VITALS — BP 116/75 | HR 83 | Temp 97.5°F | Ht 69.0 in | Wt 187.0 lb

## 2019-04-28 DIAGNOSIS — C712 Malignant neoplasm of temporal lobe: Secondary | ICD-10-CM | POA: Insufficient documentation

## 2019-04-28 DIAGNOSIS — N3944 Nocturnal enuresis: Secondary | ICD-10-CM | POA: Insufficient documentation

## 2019-04-28 DIAGNOSIS — G8110 Spastic hemiplegia affecting unspecified side: Secondary | ICD-10-CM

## 2019-04-28 MED ORDER — TEMOZOLOMIDE 100 MG PO CAPS: 150.0000 mg/m2/d | ORAL_CAPSULE | Freq: Every day | ORAL | 0 refills | Status: DC

## 2019-04-28 NOTE — Progress Notes (Signed)
Botox Injection for spasticity using needle EMG guidance  Indication: Severe spasticity which interferes with ADL,mobility and/or  hygiene and is unresponsive to medication management and other conservative care Informed consent was obtained after describing risks and benefits of the procedure with the patient. This includes bleeding, bruising, infection, excessive weakness, or medication side effects. A REMS form is on file and signed.  Total of 300U were injected as follows: Biceps Brachii: 100U: 50 in 4 sites Flexor Digitorum profundus: 30U in 1 site Flexor Pollicis Longus: 123456 in 1 site Flexor Digitorum Superficialis: 30U in 1 site Adductor Pollicis: 123456 in 1 site.   All injections were done after obtaining appropriate EMG activity and after negative drawback for blood. The patient tolerated the procedure well. Post procedure instructions were given. A followup appointment was made.

## 2019-04-28 NOTE — Telephone Encounter (Signed)
Contacted patient and recommended initiating third cycle of adjuvant Temozolomide, based on improvement in thrombocytopenia (below).  Dose will be 150mg /m2, so 300mg  x5 days.  Will follow up as scheduled following next MRI brain  Ventura Sellers, MD  CBC    Component Value Date/Time   WBC 2.7 (L) 04/27/2019 1253   WBC 23.0 (H) 11/27/2018 0936   RBC 3.66 (L) 04/27/2019 1253   HGB 11.3 (L) 04/27/2019 1253   HCT 34.1 (L) 04/27/2019 1253   PLT 268 04/27/2019 1253   MCV 93.2 04/27/2019 1253   MCH 30.9 04/27/2019 1253   MCHC 33.1 04/27/2019 1253   RDW 16.4 (H) 04/27/2019 1253   LYMPHSABS 0.6 (L) 04/27/2019 1253   MONOABS 0.4 04/27/2019 1253   EOSABS 0.0 04/27/2019 1253   BASOSABS 0.0 04/27/2019 1253

## 2019-04-29 ENCOUNTER — Encounter: Payer: Self-pay | Admitting: Occupational Therapy

## 2019-04-29 ENCOUNTER — Encounter: Payer: Self-pay | Admitting: Physical Therapy

## 2019-04-29 ENCOUNTER — Other Ambulatory Visit: Payer: Self-pay

## 2019-04-29 ENCOUNTER — Ambulatory Visit: Payer: 59 | Admitting: Physical Therapy

## 2019-04-29 ENCOUNTER — Ambulatory Visit: Payer: 59 | Admitting: Occupational Therapy

## 2019-04-29 ENCOUNTER — Ambulatory Visit: Payer: 59

## 2019-04-29 DIAGNOSIS — R41842 Visuospatial deficit: Secondary | ICD-10-CM

## 2019-04-29 DIAGNOSIS — R482 Apraxia: Secondary | ICD-10-CM

## 2019-04-29 DIAGNOSIS — G8191 Hemiplegia, unspecified affecting right dominant side: Secondary | ICD-10-CM

## 2019-04-29 DIAGNOSIS — R2681 Unsteadiness on feet: Secondary | ICD-10-CM

## 2019-04-29 DIAGNOSIS — R2689 Other abnormalities of gait and mobility: Secondary | ICD-10-CM

## 2019-04-29 DIAGNOSIS — M6281 Muscle weakness (generalized): Secondary | ICD-10-CM

## 2019-04-29 DIAGNOSIS — R41841 Cognitive communication deficit: Secondary | ICD-10-CM

## 2019-04-29 DIAGNOSIS — R29818 Other symptoms and signs involving the nervous system: Secondary | ICD-10-CM

## 2019-04-29 DIAGNOSIS — R4701 Aphasia: Secondary | ICD-10-CM

## 2019-04-29 NOTE — Therapy (Signed)
[facility] [address] [place], [place], [address] Phone: [phone]   Fax:  [phone]  Occupational Therapy Treatment  Patient Details  Name: Gregory Moreno MRN: [id number] Date of Birth: Jan 26, 1995 Referring Provider (OT): Marlowe Shores   Encounter Date: 04/29/2019  OT End of Session - 04/29/19 1742    Visit Number  35    Number of Visits  41    Date for OT Re-Evaluation  05/17/19    Authorization Type  No VL - No auth required    OT Start Time  1615    OT Stop Time  1700    OT Time Calculation (min)  45 min    Activity Tolerance  Patient tolerated treatment well    Behavior During Therapy  [facility] for tasks assessed/performed       History reviewed. No pertinent past medical history.  Past Surgical History:  Procedure Laterality Date  . CRANIOTOMY Left 10/13/2018   Procedure: LEFT CRANIOTOMY FOR TUMOR RESECTION;  Surgeon: Judith Part, MD;  Location: [facility];  Service: Neurosurgery;  Laterality: Left;    There were no vitals filed for this visit.  Subjective Assessment - 04/29/19 1734    Subjective   [place]  Patient is taking a four day vacation with his parents to a lake in [place].    Patient is accompanied by:  Family member    Currently in Pain?  No/denies    Pain Score  0-No pain                   OT Treatments/Exercises (OP) - 04/29/19 0001      Neurological Re-education Exercises   Other Exercises 1  Neuromuscular reeducation to address gross motor coordiantion during transitional movements.  Patient is apraxic, and has limited movement options.  Worked on transition in and out of quadruped, sidesit, supine to right and to left.  Patient with improved ability to utilize right UE, needed assistance to flex right LE underneath him to obtain this position.  Patient without wrist discomfort this session.  Patient easily frustrated with this activity - stating I can't when  stumped.  With increased time and some demonstration patient able to move in and out of these positions with mod assist.      Other Exercises 2  Standing - modified plantigrade  for  midline orientation, and forced use of RUE/RLE.  Followed with guided movement for forward functional reach.  shoulder flex with elbow ext.               OT Education - 04/29/19 1741    Education Details  Botox injection benefits within 10 days    Person(s) Educated  Patient;Parent(s)    Methods  Explanation    Comprehension  Verbalized understanding       OT Short Term Goals - 03/31/19 1625      OT SHORT TERM GOAL #6   Title  Patient will don/doff underwear, pants, and left shoe with min assist    Status  Achieved      OT SHORT TERM GOAL #7   Title  Patient will complete shower transfer with supervision    Status  Achieved      OT SHORT TERM GOAL #8   Title  Patient will bathe self in shower with supervision and cueing assistance    Status  Achieved        OT Long Term Goals - 04/22/19 1710  OT LONG TERM GOAL #2   Title  Patient will shower with supervision    Status  Achieved            Plan - 04/29/19 1743    Clinical Impression Statement  Patient received botox injection to RUE yesterday.  Still awaiting restart of chemo    OT Frequency  2x / week    OT Duration  8 weeks    OT Treatment/Interventions  Self-care/ADL training;DME and/or AE instruction;Splinting;Balance training;Aquatic Therapy;Therapeutic activities;Therapeutic exercise;Cognitive remediation/compensation;Neuromuscular education;Functional Mobility Training;Visual/perceptual remediation/compensation;Electrical Stimulation;Manual Therapy;Patient/family education    Plan  Postural control, more consistent activation in right leg in standing, right arm to work out of modified closed chain toward partially open chain. aquatic therapy to address postural alignment and control, NMR for R side    Consulted and Agree  with Plan of Care  Patient;Family member/caregiver    Family Member Consulted  dad       Patient will benefit from skilled therapeutic intervention in order to improve the following deficits and impairments:           Visit Diagnosis: Apraxia  Hemiplegia affecting right dominant side, unspecified etiology, unspecified hemiplegia type ([place])  Visuospatial deficit  Other symptoms and signs involving the nervous system  Muscle weakness (generalized)  Unsteadiness on feet    Problem List Patient Active Problem List   Diagnosis Date Noted  . Goals of care, counseling/discussion 12/16/2018  . Palliative care by specialist   . 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 Spastic hemiparesis ([place])   . Cerebral edema (HCC)   . Intracranial tumor ([place])   . Steroid-induced hyperglycemia   . Spastic hemiplegia affecting nondominant side ([place])   . Hyponatremia   . Transaminitis   . Leucocytosis   . Right spastic hemiplegia ([place]) 10/21/2018  . Dysphagia 10/21/2018  . 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 Brain mass   . ICH (intracerebral hemorrhage) ([place]) 10/13/2018    Gregory Moreno 04/29/2019, 5:44 PM  [facility] [address] [place], [place], [address] Phone: [phone]   Fax:  [phone]  Name: Gregory Moreno MRN: [id number] Date of Birth: 05-16-1995

## 2019-04-29 NOTE — Therapy (Signed)
[facility] [address] [place], [place], [address] Phone: [phone]   Fax:  [phone]  Speech Language Pathology Treatment  Patient Details  Name: Gregory Moreno MRN: [id number] Date of Birth: February 13, 1995 Referring Provider (SLP): Alger Simons, MD   Encounter Date: 04/29/2019  End of Session - 04/29/19 1708    Visit Number  32    Number of Visits  41    Date for SLP Re-Evaluation  05/25/19    SLP Start Time  1450    SLP Stop Time   6237    SLP Time Calculation (min)  41 min    Activity Tolerance  Patient tolerated treatment well       No past medical history on file.  Past Surgical History:  Procedure Laterality Date  . CRANIOTOMY Left 10/13/2018   Procedure: LEFT CRANIOTOMY FOR TUMOR RESECTION;  Surgeon: Judith Part, MD;  Location: [facility];  Service: Neurosurgery;  Laterality: Left;    There were no vitals filed for this visit.         ADULT SLP TREATMENT - 04/29/19 1456      General Information   Behavior/Cognition  Cooperative;Requires cueing;Alert;Decreased sustained attention      Treatment Provided   Treatment provided  Cognitive-Linquistic      Cognitive-Linquistic Treatment   Treatment focused on  Cognition;Aphasia;Apraxia    Skilled Treatment  Converstion with pt for 5 minutes re: high interest topic with min extra time and rare min A from dad- functional. SLP worked with pt to ensure he will track his success with med requests. Mod cues necessary occasionally for sequencing steps for reminders       Assessment / Recommendations / [place] with current plan of care      Progression Toward Goals   Progression toward goals  Progressing toward goals         SLP Short Term Goals - 04/29/19 1708      SLP SHORT TERM GOAL #1   Title  pt will complete aphasia assessment of auditory comprehension and verbal expression within 1-2 sessions    Status  Achieved       SLP SHORT TERM GOAL #2   Title  pt will demo understanding of simple commands (spoken) with 70% and occasional min cues over three sessions    Time  2    Period  Weeks    Status  On-going      SLP SHORT TERM GOAL #3   Title  pt will answer pertinent questions with at least 3 words    Status  Not Met      SLP SHORT TERM GOAL #4   Title  pt will demo sustained/selective attention necessary to complete efficacious speech therapy in 6 sessions    Baseline  04-07-19, 04-27-19, 04-29-19    Time  2    Period  Weeks   or 8 sessions   Status  On-going       SLP Long Term Goals - 04/29/19 1709      SLP LONG TERM GOAL #1   Title  pt will demo understanding of commands (spoken) with 75% and rare min cues over three sessions    Baseline  02-06-19    Time  3    Period  Weeks   or 17 sessions, for all LTGs   Status  Revised      SLP LONG TERM GOAL #2   Title  pt  will answer 5 questions with [facility] verbal responses, in 3 sessions    Baseline  01-27-19, 04-29-19    Time  3    Period  Weeks    Status  Revised      SLP LONG TERM GOAL #3   Title  pt will sustain loud /a/ with average mid 70s dB over 4 sessions    Time  5    Period  Weeks    Status  Deferred   due to focus on language     SLP LONG TERM GOAL #4   Title  pt will engage in simple conversation for 5 minutes with average loudness mid 60s dB over 3 sessions    Time  5    Period  Weeks    Status  Deferred   due to focus on language     SLP LONG TERM GOAL #5   Title  pt will generate 2 subjects and 3 objects for a given transitive verb with occasional min-mod A    Time  3    Period  Weeks    Status  On-going       Plan - 04/29/19 1708    Clinical Impression Statement  Pt with mod Broca's aphasia, dysarthria/voice, and cognitive communication deficits. Pt also exhibits s/sx verbal apraxia. Pt cont to require consistent SLP cues for verbal communication, pt better with written cues. See "skilled intervention" for more details  on today's session. Pt would cont to benefit from skilled ST focusing on primarily receptive and expressive language but also at some point in rehab process focus on speech loudness and cognitive communication skills. Pt cont to require skilled ST focusing primarily on language skills.    Speech Therapy Frequency  2x / week    Duration  --   8 weeks or 41 total visits - see "clinicial impression" for more details   Treatment/Interventions  Environmental controls;Functional tasks;Compensatory techniques;Multimodal communcation approach;SLP instruction and feedback;Cueing hierarchy;Language facilitation;Cognitive reorganization;Internal/external aids;Patient/family education    Potential to Achieve Goals  Good    Potential Considerations  Co-morbidities;Ability to learn/carryover information;Severity of impairments;Other (comment)       Patient will benefit from skilled therapeutic intervention in order to improve the following deficits and impairments:   Cognitive communication deficit  Aphasia  Verbal apraxia    Problem List Patient Active Problem List   Diagnosis Date Noted  . Goals of care, counseling/discussion 12/16/2018  . Palliative care by specialist   . 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 Spastic hemiparesis ([place])   . Cerebral edema (H[facility])   . Intracranial tumor ([place])   . Steroid-induced hyperglycemia   . Spastic hemiplegia affecting nondominant side ([place])   . Hyponatremia   . Transaminitis   . Leucocytosis   . Right spastic hemiplegia ([place]) 10/21/2018  . Dysphagia 10/21/2018  . 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 Brain mass   . ICH (intracerebral hemorrhage) ([place]) 10/13/2018    [facility] ,[place], [place]  04/29/2019, 5:10 PM  [facility] [address] [place], [place], [address] Phone: [phone]   Fax:  [phone]   Name: FOYE DAMRON MRN: [id number] Date of  Birth: 1995-04-10

## 2019-04-30 ENCOUNTER — Telehealth: Payer: Self-pay

## 2019-04-30 NOTE — Telephone Encounter (Signed)
Order for a AFO faxed to [facility]

## 2019-05-03 NOTE — Therapy (Signed)
[facility] [address] [place], [place], [address] Phone: [phone]   Fax:  [phone]  Physical Therapy Treatment  Patient Details  Name: Gregory Moreno MRN: [id number] Date of Birth: 04/14/1995 Referring Provider (PT): Alger Simons, MD   Encounter Date: 04/29/2019  PT End of Session - 05/03/19 2031    Visit Number  34    Number of Visits  40    Date for PT Re-Evaluation  05/16/19    Authorization Type  [employer] - 2021, no VL, $40 copay per therapy per day    PT Start Time  1533    PT Stop Time  1618    PT Time Calculation (min)  45 min    Equipment Utilized During Treatment  Gait belt    Activity Tolerance  Patient tolerated treatment well    Behavior During Therapy  [facility] for tasks assessed/performed       History reviewed. No pertinent past medical history.  Past Surgical History:  Procedure Laterality Date  . CRANIOTOMY Left 10/13/2018   Procedure: LEFT CRANIOTOMY FOR TUMOR RESECTION;  Surgeon: Judith Part, MD;  Location: [facility];  Service: Neurosurgery;  Laterality: Left;    There were no vitals filed for this visit.      04/29/19 1536  Symptoms/Limitations  Subjective Got Botox yesterday in his arm.  Limitations Sitting;Walking;Standing  How long can you walk comfortably? 100' with hemiwalker  Patient Stated Goals wants to improve his walking, get back to normal  Pain Assessment  Currently in Pain? No/denies                  [facility] Adult PT Treatment/Exercise - 05/03/19 0001      Ambulation/Gait   Ambulation/Gait  Yes    Ambulation/Gait Assistance  4: Min assist;3: Mod assist    Ambulation/Gait Assistance Details  Performed 1 lap of gait around therapy gym with pt wearing R anterior AFO with no AD - one therapist standing in front of pt with pt's LUE on therapist's arm to help promote upright posture and for L trunk elongation. A second therapist posteriorly to pt providing  manual facilitation at R pelvis for incr weight shift and downward/anterior pressure through R pelvis for proprioception and closed chain glute med activation during stance with pt demonstrating decr genu recurvatum, pt still at times demonstrating R toe drag. Performed 2 x 15' of gait with pt wearing no AFO with one therapist in front of pt with pt's LUE on therapist's shoulder and second therapist providing mod A at pt's RLE for foot clearance and knee control during stance     Ambulation Distance (Feet)  115 Feet   plus 2 x 15'    Assistive device  None   x2 therapist assist   Gait Pattern  Decreased stance time - right;Decreased stride length;Decreased hip/knee flexion - right;Decreased dorsiflexion - right;Decreased weight shift to right;Right genu recurvatum;Poor foot clearance - right    Ambulation Surface  Level;Indoor      Neuro Re-ed    Neuro Re-ed Details   Standing at staircase with LUE support on railing - single therapist providing mod A at pt's RLE during stance and for knee flexion/foot clearance, 2nd therapist providing min guard/min A at pt's trunk and pelvis for balance. 10 reps with stance on RLE without R AFO with tapping LLE to 2nd step on stair case for incr weight shifting/weight bearing and anterior translation of tibia on RLE, with therapist providing assist  to prevent R genu recurvatum. 10 reps of tapping 2nd step with RLE - min/mod A for foot clearance for incr hip/knee flexion during swing, cues for "big push off" with RLE for incr PF activation. On mat table in tall kneeling: x10 reps bring LLE anteriorly for hip flexion/and then posteriorly for hip ext for increased weight bearing through RLE, therapist providing manual facilitation through R pelvis for proprioception and R hip ABD activation in closed chain during stance                PT Short Term Goals - 04/15/19 1623      PT SHORT TERM GOAL #1   Title  Pt will ambulate at least 115' with supervision with  small base quad cane in order to improve household mobility. ALL STGS DUE 04/14/19    Baseline  met on 04/15/19    Time  4    Period  Weeks    Status  Achieved    Target Date  04/14/19      PT SHORT TERM GOAL #2   Title  Patient will undergo further assessment of gait speed with LRAD - goal to be written as appropriate.    Baseline  .97 ft/sec on 3/331/21    Period  Weeks    Status  Achieved      PT SHORT TERM GOAL #3   Title  Pt will perform at least 5 sit <> stands from mat table with proper technique with no cueing and mod I in order to improve functional transfers and demo improved LE strength.    Baseline  throughout session, partially met, needing intermittent verbal cues    Time  4    Period  Weeks    Status  Partially Met      PT SHORT TERM GOAL #4   Title  Pt will perform all bed mobility with supervision in order to decrease caregiver burden.    Baseline  not performed on today's session    Time  4    Period  Weeks    Status  Deferred      PT SHORT TERM GOAL #5   Title  Patient will perform at least 4 steps using single handrail and step to pattern with min guard in order to safely enter/exit the RV.    Baseline  not performed during today's session    Time  4    Period  Weeks    Status  Deferred        PT Long Term Goals - 03/17/19 2026      PT LONG TERM GOAL #1   Title  Patient and pt's family will be independent with final HEP designed to improve movement and strength in RLE.  ALL LTGS DUE 05/12/19    Baseline  on-going    Time  8    Period  Weeks    Status  New    Target Date  05/12/19      PT LONG TERM GOAL #2   Title  Pt will ambulate at least 200' over level indoor and outdoor paved surfaces with supervision in order to improve functional mobility with small base quad cane vs. LRAD    Time  8    Period  Weeks    Status  New      PT LONG TERM GOAL #3   Title  Pt will perform at least 10 sit <> stands from mat table with proper technique with no  cueing  and mod I in order to improve functional transfers and demo improved LE strength.    Time  8    Period  Weeks    Status  On-going      PT LONG TERM GOAL #4   Title  Pt will perform all bed mobility from level mat table with mod I in order to decrease caregiver burden.    Time  12    Period  Weeks    Status  On-going      PT LONG TERM GOAL #5   Title  Patient will perform at least 4 steps using single handrail and step to pattern with supervision in order to safely enter/exit the RV.    Baseline  8 steps using single handrail and step to pattern with min A ascending/descending    Time  8    Period  Weeks    Status  Achieved      PT LONG TERM GOAL #6   Title  Pt will perform a curb with min guard in order to improve community mobility and independence.    Time  8    Period  Weeks    Status  New            Plan - 05/03/19 2049    Clinical Impression Statement  Pt with improvement in R genu recurvatum in gait today with manual facilitation through R pelvis during swing for incr R glute med activation in closed chain, still demonstrated intermittent toe drag. Progressed to performing NMR at staircase for incr PF activation through RLE without wearing R AFO to assist with gait mechanics to decr R toe drag - with pt fatiguing more easily. Will continue to progress towards LTGs.    Personal Factors and Comorbidities  --   pt ungergoing chemo/radiation   Examination-Activity Limitations  Bed Mobility;Locomotion Level;Sit;Squat;Stairs;Stand;Transfers    Examination-Participation Restrictions  Community Activity;School;Driving    Stability/Clinical Decision Making  Evolving/Moderate complexity    Rehab Potential  Good    PT Frequency  2x / week    PT Duration  8 weeks    PT Treatment/Interventions  ADLs/Self Care Home Management;Therapeutic exercise;Therapeutic activities;Functional mobility training;Stair training;Gait training;DME Instruction;Balance training;Neuromuscular  re-education;Patient/family education;Orthotic Fit/Training;Wheelchair mobility training;Energy conservation;Passive range of motion    PT Next Visit Plan  pt with orthotic consult with chris on wednesday. Half kneeling. Gait outside with AFO and cane - curb, stepping over obstacles, grass, pavement etc.  Needs to work on closed chain glute med activation on R.  tall kneeling/floor recovery transfers. dynamic standing balance with decr UE support with weight shifitng towards RLE. stair/curb training.    PT Home Exercise Plan  [email or web address]    Consulted and Agree with Plan of Care  Patient;Family member/caregiver    Family Member Consulted  dad, [place]       Patient will benefit from skilled therapeutic intervention in order to improve the following deficits and impairments:  Abnormal gait, Decreased activity tolerance, Decreased balance, Decreased cognition, Decreased coordination, Decreased safety awareness, Decreased range of motion, Decreased mobility, Difficulty walking, Decreased strength, Decreased endurance, Impaired tone  Visit Diagnosis: Unsteadiness on feet  Muscle weakness (generalized)  Other symptoms and signs involving the nervous system  Other abnormalities of gait and mobility     Problem List Patient Active Problem List   Diagnosis Date Noted  . Goals of care, counseling/discussion 12/16/2018  . Palliative care by specialist   . 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 Spastic hemiparesis (  [place])   . Cerebral edema ([place])   . Intracranial tumor ([place])   . Steroid-induced hyperglycemia   . Spastic hemiplegia affecting nondominant side ([place])   . Hyponatremia   . Transaminitis   . Leucocytosis   . Right spastic hemiplegia ([place]) 10/21/2018  . Dysphagia 10/21/2018  . 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 Brain mass   . ICH (intracerebral hemorrhage) ([place]) 10/13/2018    Arliss Journey, PT, DPT  05/03/2019, 8:51 PM  [facility] [address] [place], [place], [address] Phone: [phone]   Fax:  [phone]  Name: Gregory Moreno MRN: [id number] Date of Birth: [birth year]

## 2019-05-04 ENCOUNTER — Ambulatory Visit: Payer: 59 | Admitting: Occupational Therapy

## 2019-05-04 ENCOUNTER — Other Ambulatory Visit: Payer: Self-pay

## 2019-05-04 ENCOUNTER — Ambulatory Visit: Payer: 59 | Admitting: Physical Therapy

## 2019-05-04 ENCOUNTER — Encounter: Payer: Self-pay | Admitting: Occupational Therapy

## 2019-05-04 DIAGNOSIS — M6281 Muscle weakness (generalized): Secondary | ICD-10-CM

## 2019-05-04 DIAGNOSIS — R482 Apraxia: Secondary | ICD-10-CM

## 2019-05-04 DIAGNOSIS — R2681 Unsteadiness on feet: Secondary | ICD-10-CM

## 2019-05-04 DIAGNOSIS — R29818 Other symptoms and signs involving the nervous system: Secondary | ICD-10-CM

## 2019-05-04 DIAGNOSIS — G8191 Hemiplegia, unspecified affecting right dominant side: Secondary | ICD-10-CM | POA: Diagnosis not present

## 2019-05-04 DIAGNOSIS — R41842 Visuospatial deficit: Secondary | ICD-10-CM

## 2019-05-04 NOTE — Therapy (Signed)
[facility] [address] [place], [place], [address] Phone: [phone]   Fax:  [phone]  Occupational Therapy Treatment  Patient Details  Name: Gregory Moreno MRN: [id number] Date of Birth: 1995-06-07 Referring Provider (OT): Marlowe [place]   Encounter Date: 05/04/2019  OT End of Session - 05/04/19 1929    Visit Number  36    Number of Visits  41    Date for OT Re-Evaluation  05/17/19    Authorization Type  No VL - No auth required    OT Start Time  1501    OT Stop Time  1544    OT Time Calculation (min)  43 min    Activity Tolerance  Patient tolerated treatment well       History reviewed. No pertinent past medical history.  Past Surgical History:  Procedure Laterality Date  . CRANIOTOMY Left 10/13/2018   Procedure: LEFT CRANIOTOMY FOR TUMOR RESECTION;  Surgeon: Judith Part, MD;  Location: [facility];  Service: Neurosurgery;  Laterality: Left;    There were no vitals filed for this visit.  Subjective Assessment - 05/04/19 1926    Subjective   I like this (meaning water therapy)    Patient is accompanied by:  Family member   mom   Pertinent History  Glioblastoma    Currently in Pain?  No/denies       Patient seen for aquatic therapy today.  Treatment took place in water 2.5-4 feet deep depending upon activity.  Pt entered the pool via ramp using 1 hand railing and close supervision. Pt able to demonstrate postural adjustments for descending ramp without cues.  Transitioned pt into supine using floatation devices and utilized aqua stretch techniques to address shoulder girdle alignment, R hip alignment, trunk alignment - pt tolerated well. Pt has h/o of vertigo so limited time in supine and avoided Bad [place]azz techniques. Will increase time in supine and attempt some limited Bad Ragazz next session as tolerated to add in reduction of spasticity.  Also addressed isolated knee flexion and extension in supine -  initially pt required max facilitation however with repetition and practice pt able to complete with mod facilitation. Transitioned back into squatting with back supported on wall and pt seated on therapist's knee to address sit to stand in midline - pt able to activate and control RLE with min facilitation for several trials. Progressed to functional ambulation both with LUE on bar as well as in open water with emphasis on moving into stance on RLE with hip alignment as well as stepping forward and backward with improved alignment progressing to functional ambulation. Pt has tremendous difficulty sensing what position his body is in however with moderate facilitation for improved alignment pt can activate RLE for stance and demonstrate controlled knee.  Pt exited the pool via ramp using 1 hand railing and min cues for postural adjustment for inclining ramp.                        OT Short Term Goals - 03/31/19 1625      OT SHORT TERM GOAL #6   Title  Patient will don/doff underwear, pants, and left shoe with min assist    Status  Achieved      OT SHORT TERM GOAL #7   Title  Patient will complete shower transfer with supervision    Status  Achieved      OT SHORT TERM GOAL #8  Title  Patient will bathe self in shower with supervision and cueing assistance    Status  Achieved        OT Long Term Goals - 05/04/19 1927      OT LONG TERM GOAL #1   Title  Patient will dress himself with no greater than set up assistance - 05/15/19    Status  Achieved      OT LONG TERM GOAL #2   Title  Patient will shower with supervision    Status  Achieved      OT LONG TERM GOAL #3   Status  Achieved      OT LONG TERM GOAL #4   Title  Patient will demonstrate ability to retrieve a lightweight item (less than 2lb) from waist height or lower and transport 6-12 inches laterally with RUE    Status  On-going      OT LONG TERM GOAL #5   Title  Patient will demonstrate RUE  mid level  reach with minimal LUE support to hit a target at chest height directly in front of body    Status  On-going      OT LONG TERM GOAL #6   Title  Patient will demonstrate adequate postural control in standing to reach for an object outside of his base of support (right) at waist height or lower with right hand and min assistance without significant loss of balance.    Status  On-going            Plan - 05/04/19 1928    Clinical Impression Statement  Pt expressing that he feels better after participating in aquatic therapy.  Pt with slow progress toward goals.    OT Occupational Profile and History  Comprehensive Assessment- Review of records and extensive additional review of physical, cognitive, psychosocial history related to current functional performance    Occupational performance deficits (Please refer to evaluation for details):  ADL's;IADL's;Rest and Sleep;Education;Work;Play;Leisure;Social Participation    Body Structure / Function / Physical Skills  ADL;Decreased knowledge of use of DME;Gait;Strength;Balance;Dexterity;GMC;Tone;Body mechanics;Hearing;Proprioception;UE functional use;Endurance;IADL;ROM;Vestibular;Vision;Coordination;Flexibility;Mobility;Sensation;FMC;Decreased knowledge of precautions    Cognitive Skills  Attention;Problem Solve;Emotional;Safety Awareness;Sequencing;Memory;Learn;Thought;Understand;Perception    Psychosocial Skills  Routines and Behaviors;Interpersonal Interaction    Rehab Potential  Good    Clinical Decision Making  Multiple treatment options, significant modification of task necessary    Comorbidities Affecting Occupational Performance:  May have comorbidities impacting occupational performance    Modification or Assistance to Complete Evaluation   Min-Moderate modification of tasks or assist with assess necessary to complete eval    OT Frequency  2x / week    OT Duration  8 weeks    OT Treatment/Interventions  Self-care/ADL training;DME and/or AE  instruction;Splinting;Balance training;Aquatic Therapy;Therapeutic activities;Therapeutic exercise;Cognitive remediation/compensation;Neuromuscular education;Functional Mobility Training;Visual/perceptual remediation/compensation;Electrical Stimulation;Manual Therapy;Patient/family education    Plan  Postural control, more consistent activation in right leg in standing, right arm to work out of modified closed chain toward partially open chain. aquatic therapy to address postural alignment and control, NMR for R side    Consulted and Agree with Plan of Care  Patient;Family member/caregiver    Family Member Consulted  mom       Patient will benefit from skilled therapeutic intervention in order to improve the following deficits and impairments:   Body Structure / Function / Physical Skills: ADL, Decreased knowledge of use of DME, Gait, Strength, Balance, Dexterity, GMC, Tone, Body mechanics, Hearing, Proprioception, UE functional use, Endurance, IADL, ROM, Vestibular, Vision, Coordination, Flexibility, Mobility, Sensation, FMC,  Decreased knowledge of precautions Cognitive Skills: Attention, Problem Solve, Emotional, Safety Awareness, Sequencing, Memory, Learn, Thought, Understand, Perception Psychosocial Skills: Routines and Behaviors, Interpersonal Interaction   Visit Diagnosis: Apraxia  Hemiplegia affecting right dominant side, unspecified etiology, unspecified hemiplegia type ([place])  Visuospatial deficit  Other symptoms and signs involving the nervous system  Muscle weakness (generalized)  Unsteadiness on feet    Problem List Patient Active Problem List   Diagnosis Date Noted  . Goals of care, counseling/discussion 12/16/2018  . Palliative care by specialist   . 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 Spastic hemiparesis ([place])   . Cerebral edema ([facility])   . Intracranial tumor ([place])   . Steroid-induced hyperglycemia   . Spastic hemiplegia affecting nondominant  side ([place])   . Hyponatremia   . Transaminitis   . Leucocytosis   . Right spastic hemiplegia ([place]) 10/21/2018  . Dysphagia 10/21/2018  . 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 Brain mass   . ICH (intracerebral hemorrhage) ([place]) 10/13/2018    Quay Burow, OTR/L 05/04/2019, 7:31 PM  [facility] [address] [place], [place], [address] Phone: [phone]   Fax:  [phone]  Name: Gregory Moreno MRN: [id number] Date of Birth: Jul 09, 1995

## 2019-05-06 ENCOUNTER — Ambulatory Visit: Payer: 59

## 2019-05-06 ENCOUNTER — Other Ambulatory Visit: Payer: Self-pay

## 2019-05-06 ENCOUNTER — Ambulatory Visit: Payer: 59 | Admitting: Physical Therapy

## 2019-05-06 ENCOUNTER — Encounter: Payer: Self-pay | Admitting: Occupational Therapy

## 2019-05-06 ENCOUNTER — Ambulatory Visit: Payer: 59 | Admitting: Occupational Therapy

## 2019-05-06 ENCOUNTER — Other Ambulatory Visit: Payer: Self-pay | Admitting: Internal Medicine

## 2019-05-06 DIAGNOSIS — R482 Apraxia: Secondary | ICD-10-CM

## 2019-05-06 DIAGNOSIS — M6281 Muscle weakness (generalized): Secondary | ICD-10-CM

## 2019-05-06 DIAGNOSIS — R41842 Visuospatial deficit: Secondary | ICD-10-CM

## 2019-05-06 DIAGNOSIS — C712 Malignant neoplasm of temporal lobe: Secondary | ICD-10-CM

## 2019-05-06 DIAGNOSIS — G8191 Hemiplegia, unspecified affecting right dominant side: Secondary | ICD-10-CM | POA: Diagnosis not present

## 2019-05-06 DIAGNOSIS — R2681 Unsteadiness on feet: Secondary | ICD-10-CM

## 2019-05-06 DIAGNOSIS — R471 Dysarthria and anarthria: Secondary | ICD-10-CM

## 2019-05-06 DIAGNOSIS — R2689 Other abnormalities of gait and mobility: Secondary | ICD-10-CM

## 2019-05-06 DIAGNOSIS — R29818 Other symptoms and signs involving the nervous system: Secondary | ICD-10-CM

## 2019-05-06 DIAGNOSIS — R4701 Aphasia: Secondary | ICD-10-CM

## 2019-05-06 NOTE — Therapy (Signed)
[facility] [address] [place], [place], [address] Phone: [phone]   Fax:  [phone]  Speech Language Pathology Treatment  Patient Details  Name: Gregory Moreno MRN: [id number] Date of Birth: August 03, 1995 Referring Provider (SLP): Alger Simons, MD   Encounter Date: 05/06/2019  End of Session - 05/06/19 1658    Visit Number  33    Number of Visits  41    Date for SLP Re-Evaluation  05/25/19    SLP Start Time  67    SLP Stop Time   1615    SLP Time Calculation (min)  41 min    Activity Tolerance  Patient tolerated treatment well       History reviewed. No pertinent past medical history.  Past Surgical History:  Procedure Laterality Date  . CRANIOTOMY Left 10/13/2018   Procedure: LEFT CRANIOTOMY FOR TUMOR RESECTION;  Surgeon: Judith Part, MD;  Location: [facility];  Service: Neurosurgery;  Laterality: Left;    There were no vitals filed for this visit.  Subjective Assessment - 05/06/19 1541    Subjective  "Went to [place]."    Patient is accompained by:  Family member            ADULT SLP TREATMENT - 05/06/19 1542      General Information   Behavior/Cognition  Cooperative;Requires cueing;Alert;Decreased sustained attention      Treatment Provided   Treatment provided  Cognitive-Linquistic      Cognitive-Linquistic Treatment   Treatment focused on  Cognition;Aphasia;Apraxia    Skilled Treatment  "I'm surprised I haven't had anything yet." (pt, re: chemo side effects). SLP engaged pt in simple conversation with consistent extra time, using SLP-encouraged pt drawing of pictures (minimally successful), along with SLP's occasional written, semantic, and phonemic cues. SLP used written and phonemic cues for names for airplane instruments on the instrument panel. - mother made individual pictures of these and has been reviewing them with pt      Assessment / Recommendations / Plan   Plan   Continue with current plan of care      Progression Toward Goals   Progression toward goals  Progressing toward goals         SLP Short Term Goals - 04/29/19 1708      SLP SHORT TERM GOAL #1   Title  pt will complete aphasia assessment of auditory comprehension and verbal expression within 1-2 sessions    Status  Achieved      SLP SHORT TERM GOAL #2   Title  pt will demo understanding of simple commands (spoken) with 70% and occasional min cues over three sessions    Time  2    Period  Weeks    Status  On-going      SLP SHORT TERM GOAL #3   Title  pt will answer pertinent questions with at least 3 words    Status  Not Met      SLP SHORT TERM GOAL #4   Title  pt will demo sustained/selective attention necessary to complete efficacious speech therapy in 6 sessions    Baseline  04-07-19, 04-27-19, 04-29-19    Time  2    Period  Weeks   or 8 sessions   Status  On-going       SLP Long Term Goals - 05/06/19 1659      SLP LONG TERM GOAL #1   Title  pt will demo understanding of simple-mod complex conversation 75% of the  time and rare min cues over three sessions    Baseline  02-06-19    Time  2    Period  Weeks   or 17 sessions, for all LTGs   Status  Revised      SLP LONG TERM GOAL #2   Title  pt will answer 5 questions with [facility] verbal responses, in 3 sessions    Baseline  01-27-19, 04-29-19    Status  Achieved      SLP LONG TERM GOAL #3   Title  pt will sustain loud /a/ with average mid 70s dB over 4 sessions    Time  5    Period  Weeks    Status  Deferred   due to focus on language     SLP LONG TERM GOAL #4   Title  pt will engage in simple conversation for 5 minutes with average loudness mid 60s dB over 3 sessions    Time  5    Period  Weeks    Status  Deferred   due to focus on language     SLP LONG TERM GOAL #5   Title  pt will generate 2 subjects and 3 objects for a given transitive verb with occasional min-mod A    Time  2    Period  Weeks    Status   On-going       Plan - 05/06/19 1659    Clinical Impression Statement  Pt with mod Broca's aphasia, dysarthria/voice, and cognitive communication deficits. Pt also exhibits s/sx verbal apraxia. Pt cont to require consistent SLP cues for verbal communication, pt better with written cues. See "skilled intervention" for more details on today's session. Pt would cont to benefit from skilled ST focusing on primarily receptive and expressive language but also at some point in rehab process focus on speech loudness and cognitive communication skills. Pt cont to require skilled ST focusing primarily on language skills.    Speech Therapy Frequency  2x / week    Duration  --   8 weeks or 41 total visits - see "clinicial impression" for more details   Treatment/Interventions  Environmental controls;Functional tasks;Compensatory techniques;Multimodal communcation approach;SLP instruction and feedback;Cueing hierarchy;Language facilitation;Cognitive reorganization;Internal/external aids;Patient/family education    Potential to Achieve Goals  Good    Potential Considerations  Co-morbidities;Ability to learn/carryover information;Severity of impairments;Other (comment)       Patient will benefit from skilled therapeutic intervention in order to improve the following deficits and impairments:   Aphasia  Verbal apraxia  Dysarthria and anarthria    Problem List Patient Active Problem List   Diagnosis Date Noted  . Goals of care, counseling/discussion 12/16/2018  . Palliative care by specialist   . 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 Spastic hemiparesis ([facility])   . Cerebral edema (HCC)   . Intracranial tumor ([facility])   . Steroid-induced hyperglycemia   . Spastic hemiplegia affecting nondominant side ([facility])   . Hyponatremia   . Transaminitis   . Leucocytosis   . Right spastic hemiplegia ([facility]) 10/21/2018  . Dysphagia 10/21/2018  . 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 Brain  mass   . ICH (intracerebral hemorrhage) ([facility]) 10/13/2018    [facility] ,[place], [place]  05/06/2019, 5:00 PM  [facility] [address] [place], [place], [address] Phone: [phone]   Fax:  [phone]   Name: Gregory Moreno MRN: [id number] Date of Birth: 1996/01/06

## 2019-05-06 NOTE — Therapy (Addendum)
[facility] [address] [place], [place], [address] Phone: [phone]   Fax:  [phone]  Physical Therapy Treatment  Patient Details  Name: Gregory Moreno MRN: [id number] Date of Birth: 19-Dec-1995 Referring Provider (PT): Alger Simons, MD   Encounter Date: 05/06/2019  PT End of Session - 05/06/19 1718    Visit Number  35    Number of Visits  40    Date for PT Re-Evaluation  05/16/19    Authorization Type  [employer] - 2021, no VL, $40 copay per therapy per day    PT Start Time  1618    PT Stop Time  1701    PT Time Calculation (min)  43 min    Equipment Utilized During Treatment  Gait belt    Activity Tolerance  Patient tolerated treatment well    Behavior During Therapy  [facility] for tasks assessed/performed       No past medical history on file.  Past Surgical History:  Procedure Laterality Date  . CRANIOTOMY Left 10/13/2018   Procedure: LEFT CRANIOTOMY FOR TUMOR RESECTION;  Surgeon: Judith Part, MD;  Location: [facility];  Service: Neurosurgery;  Laterality: Left;    There were no vitals filed for this visit.  Subjective Assessment - 05/06/19 1620    Subjective  Went camping for the first time since August this past weekend in [place]. Was in a RV.    Limitations  Sitting;Walking;Standing    How long can you walk comfortably?  100' with hemiwalker    Patient Stated Goals  wants to improve his walking, get back to normal    Currently in Pain?  No/denies                       [facility] Adult PT Treatment/Exercise - 05/07/19 0001      Transfers   Transfers  Sit to Stand;Stand to Sit    Sit to Stand  5: Supervision    Stand to Sit  5: Supervision    Comments  x6 reps from mat table with no UE support for increased weight shifting towards RLE vs. using L hand from mat to come to stand, towards end of 5 reps pt demonstrating incr hip ADD and hip IR - did respond well to "keep knees apart" with pt  having less ADD with last rep      Ambulation/Gait   Ambulation/Gait  Yes    Ambulation/Gait Assistance  4: Min assist    Ambulation/Gait Assistance Details  Performed gait around gym with pt using quad based cane, therapist posteriorly providing manual facilitation at R pelvis for incr weight shift/downward pressure and proprioception for closed chain glute med activation/strengthening during stance phase. Performed gait with higher heel wedge - pt demonstrating decreased toe drag and less forceful genu recurvatum on R.     Ambulation Distance (Feet)  160 Feet    Assistive device  Small based quad cane   R AFO    Gait Pattern  Decreased stance time - right;Decreased stride length;Decreased hip/knee flexion - right;Decreased dorsiflexion - right;Decreased weight shift to right;Right genu recurvatum;Poor foot clearance - right    Ambulation Surface  Level;Indoor      Exercises   Exercises  Other Exercises    Other Exercises   Attempted closed chain hip ABD at wall to strengthen R glute med, provided visual/demonstrative and verbal/tactile cues to perform, however with pt's apraxia, unable to perform correctly. At edge of mat  performed having pt's R leg off mat table and x10 reps therapist resisted hip flexion - bringing leg back onto mat table, then performed x10 reps resisted supine marching for RLE hip flexion strengthening for gait mechanics during swing phase.          Orthotic management: Orthotic consult: Orthotist, [name], from [facility] present during first half of session to assess pt's current AFO. Due to pt's improvements with therapy, pt's current brace is limiting him and will need a new one based on improvements in strength (pt's current brace was given to pt 2 weeks into inpatient rehab). With current anterior AFO, pt demonstrating increased R knee hyperextension, R toe drag, decreased foot clearance. [name] recommending trying a more sturdy posterior brace such as the SpryStep (will  bring to pt's session tomorrow or have it in the clinic by next week), and if that brace doesn't improve gait mechanics, then recommending a custom AFO. Due to R genu recurvatum and hyperextension, [name] also recommending that pt purchase a heavier material shoe with a thicker and harder sole that doesn't have a rocker motion (pt's current shoes are about 23 years old and are not providing much support) - but to wait to purchase after determining which brace would be best. [name] mom and pt in understanding and agreement. Trialed a thicker heel wedge to assess throughout session, with pt not demonstrating any increased forceful R genu recurvatum with gait.       PT Short Term Goals - 04/15/19 1623      PT SHORT TERM GOAL #1   Title  Pt will ambulate at least 115' with supervision with small base quad cane in order to improve household mobility. ALL STGS DUE 04/14/19    Baseline  met on 04/15/19    Time  4    Period  Weeks    Status  Achieved    Target Date  04/14/19      PT SHORT TERM GOAL #2   Title  Patient will undergo further assessment of gait speed with LRAD - goal to be written as appropriate.    Baseline  .97 ft/sec on 3/331/21    Period  Weeks    Status  Achieved      PT SHORT TERM GOAL #3   Title  Pt will perform at least 5 sit <> stands from mat table with proper technique with no cueing and mod I in order to improve functional transfers and demo improved LE strength.    Baseline  throughout session, partially met, needing intermittent verbal cues    Time  4    Period  Weeks    Status  Partially Met      PT SHORT TERM GOAL #4   Title  Pt will perform all bed mobility with supervision in order to decrease caregiver burden.    Baseline  not performed on today's session    Time  4    Period  Weeks    Status  Deferred      PT SHORT TERM GOAL #5   Title  Patient will perform at least 4 steps using single handrail and step to pattern with min guard in order to safely enter/exit  the RV.    Baseline  not performed during today's session    Time  4    Period  Weeks    Status  Deferred        PT Long Term Goals - 03/17/19 2026  PT LONG TERM GOAL #1   Title  Patient and pt's family will be independent with final HEP designed to improve movement and strength in RLE.  ALL LTGS DUE 05/12/19    Baseline  on-going    Time  8    Period  Weeks    Status  New    Target Date  05/12/19      PT LONG TERM GOAL #2   Title  Pt will ambulate at least 200' over level indoor and outdoor paved surfaces with supervision in order to improve functional mobility with small base quad cane vs. LRAD    Time  8    Period  Weeks    Status  New      PT LONG TERM GOAL #3   Title  Pt will perform at least 10 sit <> stands from mat table with proper technique with no cueing and mod I in order to improve functional transfers and demo improved LE strength.    Time  8    Period  Weeks    Status  On-going      PT LONG TERM GOAL #4   Title  Pt will perform all bed mobility from level mat table with mod I in order to decrease caregiver burden.    Time  12    Period  Weeks    Status  On-going      PT LONG TERM GOAL #5   Title  Patient will perform at least 4 steps using single handrail and step to pattern with supervision in order to safely enter/exit the RV.    Baseline  8 steps using single handrail and step to pattern with min A ascending/descending    Time  8    Period  Weeks    Status  Achieved      PT LONG TERM GOAL #6   Title  Pt will perform a curb with min guard in order to improve community mobility and independence.    Time  8    Period  Weeks    Status  New            Plan - 05/07/19 1012    Clinical Impression Statement  Orthotist, [name], present at start of session to assess pt for new R AFO. [name] recommending pt try a Spry Step posterior brace (will bring one to clinic) and if that brace does not improve gait mechanics, then pt will be best suited for a  more solid custom brace. Continued to perform NMR to R pelvis during gait with downward pressure during stance phase for glute med activation and strengthening with pt demonstrating decr R toe drag with gait today. Will continue to progress towards LTGs.    Personal Factors and Comorbidities  --   pt ungergoing chemo/radiation   Examination-Activity Limitations  Bed Mobility;Locomotion Level;Sit;Squat;Stairs;Stand;Transfers    Examination-Participation Restrictions  Community Activity;School;Driving    Stability/Clinical Decision Making  Evolving/Moderate complexity    Rehab Potential  Good    PT Frequency  2x / week    PT Duration  8 weeks    PT Treatment/Interventions  ADLs/Self Care Home Management;Therapeutic exercise;Therapeutic activities;Functional mobility training;Stair training;Gait training;DME Instruction;Balance training;Neuromuscular re-education;Patient/family education;Orthotic Fit/Training;Wheelchair mobility training;Energy conservation;Passive range of motion    PT Next Visit Plan  did chris bring in SpryStep AFO to trial? Needs to work on closed chain glute med activation on R - try lying on R side and pushing into mat today. Half kneeling/tall kneeling.  dynamic standing balance with decr UE support with weight shifitng towards RLE    PT Home Exercise Plan  [email or web address]    Consulted and Agree with Plan of Care  Patient;Family member/caregiver    Family Member Consulted  dad, Fionn       Patient will benefit from skilled therapeutic intervention in order to improve the following deficits and impairments:  Abnormal gait, Decreased activity tolerance, Decreased balance, Decreased cognition, Decreased coordination, Decreased safety awareness, Decreased range of motion, Decreased mobility, Difficulty walking, Decreased strength, Decreased endurance, Impaired tone  Visit Diagnosis: Other symptoms and signs involving the nervous system  Muscle weakness (generalized)  Unsteadiness on  feet  Other abnormalities of gait and mobility     Problem List Patient Active Problem List   Diagnosis Date Noted  . Goals of care, counseling/discussion 12/16/2018  . Palliative care by specialist   . Glioblastoma multiforme of temporal lobe ([facility]) 11/11/2018  . Spastic hemiparesis ([facility])   . Cerebral edema (HCC)   . Intracranial tumor ([facility])   . Steroid-induced hyperglycemia   . Spastic hemiplegia affecting nondominant side ([facility])   . Hyponatremia   . Transaminitis   . Leucocytosis   . Right spastic hemiplegia ([facility]) 10/21/2018  . Dysphagia 10/21/2018  . 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 Brain mass   . ICH (intracerebral hemorrhage) ([facility]) 10/13/2018    Arliss Journey, PT, DPT  05/07/2019, 12:33 PM  [facility] [address] [place], [place], [address] Phone: [phone]   Fax:  [phone]  Name: Gregory Moreno MRN: [id number] Date of Birth: May 16, 1995

## 2019-05-06 NOTE — Therapy (Signed)
[facility] [address] [place], [place], [address] Phone: [phone]   Fax:  [phone]  Occupational Therapy Treatment  Patient Details  Name: Gregory Moreno MRN: [id number] Date of Birth: [birth year] Referring Provider (OT): Mar[place]e [place]   Encounter Date: 05/06/2019  OT End of Session - 05/06/19 1854    Visit Number  37    Number of Visits  41    Date for OT Re-Evaluation  05/17/19    Authorization Type  No VL - No auth required    OT Start Time  1700    OT Stop Time  1745    OT Time Calculation (min)  45 min    Activity Tolerance  Patient tolerated treatment well    Behavior During Therapy  [facility] for tasks assessed/performed       History reviewed. No pertinent past medical history.  Past Surgical History:  Procedure Laterality Date  . CRANIOTOMY Left 10/13/2018   Procedure: LEFT CRANIOTOMY FOR TUMOR RESECTION;  Surgeon: Judith Part, MD;  Location: [facility];  Service: Neurosurgery;  Laterality: Left;    There were no vitals filed for this visit.  Subjective Assessment - 05/06/19 1714    Subjective   When asked what he feels good about in therapy - patient answered - swimming - love it!    Patient is accompanied by:  Family member    Currently in Pain?  No/denies    Pain Score  0-No pain                   OT Treatments/Exercises (OP) - 05/06/19 0001      ADLs   ADL Comments  Discussion with mom and patient about starting trialf of leaving patient unattended during the day for brief periods.  Discussed strategies- patient toileted and fed, and directed to limit activity while away initially for approx 1 hour period.        Neurological Re-education Exercises   Other Exercises 1  Neuromuscular reeducation to address proximal strengthening in shoulder girdles, as well as address gross motor coordination with transitional movements.  Supine dowel exercise to address shoulder flex with  elbow extension, and ability to sustain active elbow extension.  Worked on addressing abdominal muscles in modified crunch incorporating BUE on dowel to reach toward flexed knees in supine.  Patient on third dose of chemo and seemingly with little symptoms.               OT Education - 05/06/19 1853    Education Details  Reviewed plan to discharge from clinic OT sessions, and continue aquatic OT therapy.  Patient and mom in agreement.    Person(s) Educated  Patient;Parent(s)    Methods  Explanation    Comprehension  Verbalized understanding       OT Short Term Goals - 03/31/19 1625      OT SHORT TERM GOAL #6   Title  Patient will don/doff underwear, pants, and left shoe with min assist    Status  Achieved      OT SHORT TERM GOAL #7   Title  Patient will complete shower transfer with supervision    Status  Achieved      OT SHORT TERM GOAL #8   Title  Patient will bathe self in shower with supervision and cueing assistance    Status  Achieved        OT Long Term Goals - 05/06/19 1857  OT LONG TERM GOAL #1   Title  Patient will dress himself with no greater than set up assistance - 05/15/19    Status  Achieved      OT LONG TERM GOAL #2   Title  Patient will shower with supervision    Status  Achieved      OT LONG TERM GOAL #3   Title  Patient will cut food on plate with modified independence    Status  Achieved      OT LONG TERM GOAL #4   Title  Patient will demonstrate ability to retrieve a lightweight item (less than 2lb) from waist height or lower and transport 6-12 inches laterally with RUE    Status  On-going      OT LONG TERM GOAL #5   Title  Patient will demonstrate RUE  mid level reach with minimal LUE support to hit a target at chest height directly in front of body    Status  On-going      OT LONG TERM GOAL #6   Title  Patient will demonstrate adequate postural control in standing to reach for an object outside of his base of support (right) at  waist height or lower with right hand and min assistance without significant loss of balance.    Status  On-going            Plan - 05/06/19 1855    Clinical Impression Statement  Patient with limited functional use of RUE, but showing improved independence with self care skills using compensatory techniques, due to improved mobility.    OT Frequency  2x / week    OT Duration  8 weeks    OT Treatment/Interventions  Self-care/ADL training;DME and/or AE instruction;Splinting;Balance training;Aquatic Therapy;Therapeutic activities;Therapeutic exercise;Cognitive remediation/compensation;Neuromuscular education;Functional Mobility Training;Visual/perceptual remediation/compensation;Electrical Stimulation;Manual Therapy;Patient/family education    Plan  Postural control, more consistent activation in right leg in standing, right arm to work out of modified closed chain toward partially open chain. aquatic therapy to address postural alignment and control, NMR for R side       Patient will benefit from skilled therapeutic intervention in order to improve the following deficits and impairments:           Visit Diagnosis: Apraxia  Hemiplegia affecting right dominant side, unspecified etiology, unspecified hemiplegia type (HCC)  Visuospatial deficit  Unsteadiness on feet  Muscle weakness (generalized)  Other symptoms and signs involving the nervous system    Problem List Patient Active Problem List   Diagnosis Date Noted  . Goals of care, counseling/discussion 12/16/2018  . Palliative care by specialist   . Glioblastoma multiforme of temporal lobe ([facility]) 11/11/2018  . Spastic hemiparesis ([place])   . Cerebral edema (HCC)   . Intracranial tumor ([place])   . Steroid-induced hyperglycemia   . Spastic hemiplegia affecting nondominant side ([facility])   . Hyponatremia   . Transaminitis   . Leucocytosis   . Right spastic hemiplegia ([facility]) 10/21/2018  . Dysphagia 10/21/2018  . 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 Brain mass   . ICH (intracerebral hemorrhage) ([facility]) 10/13/2018    Gregory Moreno, OTR/L 05/06/2019, 6:59 PM  [facility] [address] [place], [place], [address] Phone: [phone]   Fax:  [phone]  Name: Gregory Moreno MRN: [id number] Date of Birth: [birth year]

## 2019-05-07 ENCOUNTER — Encounter: Payer: Self-pay | Admitting: Physical Therapy

## 2019-05-07 ENCOUNTER — Encounter: Payer: 59 | Admitting: Occupational Therapy

## 2019-05-07 ENCOUNTER — Ambulatory Visit: Payer: 59 | Admitting: Physical Therapy

## 2019-05-07 ENCOUNTER — Ambulatory Visit: Payer: 59

## 2019-05-07 DIAGNOSIS — R29818 Other symptoms and signs involving the nervous system: Secondary | ICD-10-CM

## 2019-05-07 DIAGNOSIS — M6281 Muscle weakness (generalized): Secondary | ICD-10-CM

## 2019-05-07 DIAGNOSIS — R2689 Other abnormalities of gait and mobility: Secondary | ICD-10-CM

## 2019-05-07 DIAGNOSIS — R2681 Unsteadiness on feet: Secondary | ICD-10-CM

## 2019-05-07 DIAGNOSIS — G8191 Hemiplegia, unspecified affecting right dominant side: Secondary | ICD-10-CM

## 2019-05-07 DIAGNOSIS — R471 Dysarthria and anarthria: Secondary | ICD-10-CM

## 2019-05-07 DIAGNOSIS — R482 Apraxia: Secondary | ICD-10-CM

## 2019-05-07 DIAGNOSIS — R4701 Aphasia: Secondary | ICD-10-CM

## 2019-05-07 NOTE — Therapy (Signed)
[facility] [address] [place], [place], [address] Phone: [phone]   Fax:  [phone]  Speech Language Pathology Treatment  Patient Details  Name: Gregory Moreno MRN: [id number] Date of Birth: 01-24-1995 Referring Provider (SLP): Alger Simons, MD   Encounter Date: 05/07/2019  End of Session - 05/07/19 1716    Visit Number  34    Number of Visits  41    Date for SLP Re-Evaluation  05/25/19    SLP Start Time  1450    SLP Stop Time   1530    SLP Time Calculation (min)  40 min    Activity Tolerance  Patient tolerated treatment well       No past medical history on file.  Past Surgical History:  Procedure Laterality Date  . CRANIOTOMY Left 10/13/2018   Procedure: LEFT CRANIOTOMY FOR TUMOR RESECTION;  Surgeon: Judith Part, MD;  Location: [facility];  Service: Neurosurgery;  Laterality: Left;    There were no vitals filed for this visit.         ADULT SLP TREATMENT - 05/07/19 0001      General Information   Behavior/Cognition  Cooperative;Requires cueing;Alert;Decreased sustained attention      Treatment Provided   Treatment provided  Cognitive-Linquistic      Pain Assessment   Pain Assessment  No/denies pain      Cognitive-Linquistic Treatment   Treatment focused on  Cognition;Aphasia;Apraxia    Skilled Treatment  SLP targeted pt's expressive language today in convergent naming tasks with abstract concepts (yellow things, things that smell, etc). Pt req'd mod-max cues consistently for category idea. In more ocncrete trials pt with mn-mod A rarely for anomia.       Assessment / Recommendations / Plan   Plan  Continue with current plan of care      Progression Toward Goals   Progression toward goals  Progressing toward goals         SLP Short Term Goals - 04/29/19 1708      SLP SHORT TERM GOAL #1   Title  pt will complete aphasia assessment of auditory comprehension and verbal  expression within 1-2 sessions    Status  Achieved      SLP SHORT TERM GOAL #2   Title  pt will demo understanding of simple commands (spoken) with 70% and occasional min cues over three sessions    Time  2    Period  Weeks    Status  On-going      SLP SHORT TERM GOAL #3   Title  pt will answer pertinent questions with at least 3 words    Status  Not Met      SLP SHORT TERM GOAL #4   Title  pt will demo sustained/selective attention necessary to complete efficacious speech therapy in 6 sessions    Baseline  04-07-19, 04-27-19, 04-29-19    Time  2    Period  Weeks   or 8 sessions   Status  On-going       SLP Long Term Goals - 05/07/19 1717      SLP LONG TERM GOAL #1   Title  pt will demo understanding of simple-mod complex conversation 75% of the time and rare min cues over three sessions    Baseline  02-06-19    Time  2    Period  Weeks   or 17 sessions, for all LTGs   Status  Revised  SLP LONG TERM GOAL #2   Title  pt will answer 5 questions with [facility] verbal responses, in 3 sessions    Baseline  01-27-19, 04-29-19    Status  Achieved      SLP LONG TERM GOAL #3   Title  pt will sustain loud /a/ with average mid 70s dB over 4 sessions    Time  5    Period  Weeks    Status  Deferred   due to focus on language     SLP LONG TERM GOAL #4   Title  pt will engage in simple conversation for 5 minutes with average loudness mid 60s dB over 3 sessions    Time  5    Period  Weeks    Status  Deferred   due to focus on language     SLP LONG TERM GOAL #5   Title  pt will generate 2 subjects and 3 objects for a given transitive verb with occasional min-mod A    Time  2    Period  Weeks    Status  On-going       Plan - 05/07/19 1717    Clinical Impression Statement  Pt with mod Broca's aphasia, dysarthria/voice, and cognitive communication deficits. Pt also exhibits s/sx verbal apraxia. Pt cont to require consistent SLP cues for verbal communication, pt better with written  cues. See "skilled intervention" for more details on today's session. Pt would cont to benefit from skilled ST focusing on primarily receptive and expressive language but also at some point in rehab process focus on speech loudness and cognitive communication skills. Pt cont to require skilled ST focusing primarily on language skills.    Speech Therapy Frequency  2x / week    Duration  --   8 weeks or 41 total visits - see "clinicial impression" for more details   Treatment/Interventions  Environmental controls;Functional tasks;Compensatory techniques;Multimodal communcation approach;SLP instruction and feedback;Cueing hierarchy;Language facilitation;Cognitive reorganization;Internal/external aids;Patient/family education    Potential to Achieve Goals  Good    Potential Considerations  Co-morbidities;Ability to learn/carryover information;Severity of impairments;Other (comment)       Patient will benefit from skilled therapeutic intervention in order to improve the following deficits and impairments:   Aphasia  Verbal apraxia  Dysarthria and anarthria    Problem List Patient Active Problem List   Diagnosis Date Noted  . Goals of care, counseling/discussion 12/16/2018  . Palliative care by specialist   . Glioblastoma multiforme of temporal lobe ([facility]) 11/11/2018  . Spastic hemiparesis ([facility])   . Cerebral edema (HCC)   . Intracranial tumor ([facility])   . Steroid-induced hyperglycemia   . Spastic hemiplegia affecting nondominant side ([facility])   . Hyponatremia   . Transaminitis   . Leucocytosis   . Right spastic hemiplegia ([facility]) 10/21/2018  . Dysphagia 10/21/2018  . 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 Brain mass   . ICH (intracerebral hemorrhage) ([facility]) 10/13/2018    [facility] ,[place], [place]  05/07/2019, 5:17 PM  [facility] [address] [place], [place], [address] Phone: [phone]   Fax:  [phone]   Name:  Gregory Moreno MRN: [id number] Date of Birth: 1995/01/22

## 2019-05-07 NOTE — Therapy (Signed)
[facility] [address] [place], [place], [address] Phone: [phone]   Fax:  [phone]  Physical Therapy Treatment  Patient Details  Name: Gregory Moreno MRN: [id number] Date of Birth: 1995/10/12 Referring Provider (PT): Alger Simons, MD   Encounter Date: 05/07/2019  PT End of Session - 05/07/19 1638    Visit Number  36    Number of Visits  40    Date for PT Re-Evaluation  05/16/19    Authorization Type  [employer] - 2021, no VL, $40 copay per therapy per day    PT Start Time  1540    PT Stop Time  1620    PT Time Calculation (min)  40 min    Equipment Utilized During Treatment  --    Activity Tolerance  Patient tolerated treatment well    Behavior During Therapy  [facility] for tasks assessed/performed       History reviewed. No pertinent past medical history.  Past Surgical History:  Procedure Laterality Date  . CRANIOTOMY Left 10/13/2018   Procedure: LEFT CRANIOTOMY FOR TUMOR RESECTION;  Surgeon: Judith Part, MD;  Location: [facility];  Service: Neurosurgery;  Laterality: Left;    There were no vitals filed for this visit.  Subjective Assessment - 05/07/19 1544    Subjective  Nothing new to report from yesterday.  Father feels like pt is walking with his leg much straighter today, not turning outwards.    Limitations  Sitting;Walking;Standing    How long can you walk comfortably?  100' with hemiwalker    Patient Stated Goals  wants to improve his walking, get back to normal    Currently in Pain?  No/denies                       [facility] Adult PT Treatment/Exercise - 05/07/19 1556      Bed Mobility   Bed Mobility  Sit to Supine;Supine to Sit;Rolling Right    Rolling Right  Contact Guard/Touching assist    Supine to Sit  Supervision/Verbal cueing    Sit to Supine  Contact Guard/Touching assist      Transfers   Transfers  Sit to Stand;Stand to Sit;Stand Pivot Transfers    Sit to Stand  5:  Supervision    Stand to Sit  5: Supervision    Stand Pivot Transfers  5: Supervision    Stand Pivot Transfer Details (indicate cue type and reason)  with cane    Comments  When performing rolling, scooting on mat and supine <> sit pt required verbal cues to utilize RLE by placing RLE into flexion for bridges or rotation to L or R      Ambulation/Gait   Ambulation/Gait  Yes    Ambulation/Gait Assistance  3: Mod assist    Ambulation/Gait Assistance Details  Utilize trekking pole on L side to promote increased L trunk elongation; therapist continued to provide cues at R pelvis for closed chain hip ABD activation during L swing phase and anterior rotation of pelvis during RLE extension at mid and terminal stance.  Decreased genu recurvatum noted today.    Ambulation Distance (Feet)  115 Feet    Assistive device  Other (Comment)   trekking pole   Gait Pattern  Step-through pattern;Decreased hip/knee flexion - right;Decreased dorsiflexion - right;Right genu recurvatum;Poor foot clearance - right    Ambulation Surface  Level;Indoor      Knee/Hip Exercises: Standing   Lateral Step Up  Right;1 set;10 reps;Hand Hold: 1;Step Height: 2"    Lateral Step Up Limitations  RLE lateral step up to 2" step with contralateral LLE hip flexion lift for SLS to facilitate increased R closed chain hip ABD      Knee/Hip Exercises: Sidelying   Hip ABduction  Strengthening;Both;4 sets;10 reps    Hip ABduction Limitations  In sidelying with bottom leg straight, cued pt to lift top leg to facilitate open chain ABD on top leg and closed chain hip ABD and core activation on bottom leg; performed 10 reps straight leg ABD.  Then transitioned to maintaining hip ABD while performing hip flexion with knee flexion <> hip extension with knee extension             PT Education - 05/07/19 1637    Education Details  will contact orthotist to see if he can bring Spry Step AFO to next PT session    Person(s) Educated   Patient;Parent(s)    Methods  Explanation    Comprehension  Verbalized understanding       PT Short Term Goals - 04/15/19 1623      PT SHORT TERM GOAL #1   Title  Pt will ambulate at least 115' with supervision with small base quad cane in order to improve household mobility. ALL STGS DUE 04/14/19    Baseline  met on 04/15/19    Time  4    Period  Weeks    Status  Achieved    Target Date  04/14/19      PT SHORT TERM GOAL #2   Title  Patient will undergo further assessment of gait speed with LRAD - goal to be written as appropriate.    Baseline  .97 ft/sec on 3/331/21    Period  Weeks    Status  Achieved      PT SHORT TERM GOAL #3   Title  Pt will perform at least 5 sit <> stands from mat table with proper technique with no cueing and mod I in order to improve functional transfers and demo improved LE strength.    Baseline  throughout session, partially met, needing intermittent verbal cues    Time  4    Period  Weeks    Status  Partially Met      PT SHORT TERM GOAL #4   Title  Pt will perform all bed mobility with supervision in order to decrease caregiver burden.    Baseline  not performed on today's session    Time  4    Period  Weeks    Status  Deferred      PT SHORT TERM GOAL #5   Title  Patient will perform at least 4 steps using single handrail and step to pattern with min guard in order to safely enter/exit the RV.    Baseline  not performed during today's session    Time  4    Period  Weeks    Status  Deferred        PT Long Term Goals - 03/17/19 2026      PT LONG TERM GOAL #1   Title  Patient and pt's family will be independent with final HEP designed to improve movement and strength in RLE.  ALL LTGS DUE 05/12/19    Baseline  on-going    Time  8    Period  Weeks    Status  New    Target Date  05/12/19  PT LONG TERM GOAL #2   Title  Pt will ambulate at least 200' over level indoor and outdoor paved surfaces with supervision in order to improve  functional mobility with small base quad cane vs. LRAD    Time  8    Period  Weeks    Status  New      PT LONG TERM GOAL #3   Title  Pt will perform at least 10 sit <> stands from mat table with proper technique with no cueing and mod I in order to improve functional transfers and demo improved LE strength.    Time  8    Period  Weeks    Status  On-going      PT LONG TERM GOAL #4   Title  Pt will perform all bed mobility from level mat table with mod I in order to decrease caregiver burden.    Time  12    Period  Weeks    Status  On-going      PT LONG TERM GOAL #5   Title  Patient will perform at least 4 steps using single handrail and step to pattern with supervision in order to safely enter/exit the RV.    Baseline  8 steps using single handrail and step to pattern with min A ascending/descending    Time  8    Period  Weeks    Status  Achieved      PT LONG TERM GOAL #6   Title  Pt will perform a curb with min guard in order to improve community mobility and independence.    Time  8    Period  Weeks    Status  New            Plan - 05/07/19 1639    Clinical Impression Statement  Orthotist not present for session today to trial alternate AFO.  Treatment session continued to focus on glute med strengthening in open and closed chain first in sidelying and then in standing with lateral step ups.  Also continued to incorporate into gait training but with use of trekking pole on L side to promote more upright trunk; decreased genu recurvatum noted today with gait.  Will continue to progress towards LTG.    Personal Factors and Comorbidities  --   pt ungergoing chemo/radiation   Examination-Activity Limitations  Bed Mobility;Locomotion Level;Sit;Squat;Stairs;Stand;Transfers    Examination-Participation Restrictions  Community Activity;School;Driving    Stability/Clinical Decision Making  Evolving/Moderate complexity    Rehab Potential  Good    PT Frequency  2x / week    PT  Duration  8 weeks    PT Treatment/Interventions  ADLs/Self Care Home Management;Therapeutic exercise;Therapeutic activities;Functional mobility training;Stair training;Gait training;DME Instruction;Balance training;Neuromuscular re-education;Patient/family education;Orthotic Fit/Training;Wheelchair mobility training;Energy conservation;Passive range of motion    PT Next Visit Plan  CHECK LTG, RECERT.  No Spry step on Thursday; will text him and ask when he will be able to bring it. Needs to work on closed chain glute med activation on R - try lying on R side and pushing into mat today. Half kneeling/tall kneeling.  dynamic standing balance with decr UE support with weight shifitng towards RLE    PT Home Exercise Plan  [email or web address]    Consulted and Agree with Plan of Care  Patient;Family member/caregiver    Family Member Consulted  dad, Armani       Patient will benefit from skilled therapeutic intervention in order to improve the following deficits and impairments:  Abnormal  gait, Decreased activity tolerance, Decreased balance, Decreased cognition, Decreased coordination, Decreased safety awareness, Decreased range of motion, Decreased mobility, Difficulty walking, Decreased strength, Decreased endurance, Impaired tone  Visit Diagnosis: Hemiplegia affecting right dominant side, unspecified etiology, unspecified hemiplegia type (HCC)  Unsteadiness on feet  Muscle weakness (generalized)  Other abnormalities of gait and mobility  Other symptoms and signs involving the nervous system     Problem List Patient Active Problem List   Diagnosis Date Noted  . Goals of care, counseling/discussion 12/16/2018  . Palliative care by specialist   . Glioblastoma multiforme of temporal lobe ([facility]) 11/11/2018  . Spastic hemiparesis ([place])   . Cerebral edema (HCC)   . Intracranial tumor ([facility])   . Steroid-induced hyperglycemia   . Spastic hemiplegia affecting nondominant side ([facility])   . Hyponatremia   .  Transaminitis   . Leucocytosis   . Right spastic hemiplegia ([facility]) 10/21/2018  . Dysphagia 10/21/2018  . 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 Brain mass   . ICH (intracerebral hemorrhage) ([facility]) 10/13/2018    Rico Junker, PT, DPT 05/07/19    4:49 PM    [facility] [address] [place], [place], [address] Phone: [phone]   Fax:  [phone]  Name: Gregory Moreno MRN: [id number] Date of Birth: 01-26-1995

## 2019-05-08 ENCOUNTER — Ambulatory Visit
Admission: RE | Admit: 2019-05-08 | Discharge: 2019-05-08 | Disposition: A | Discharge status: Home / Self Care | Payer: 59 | Source: Ambulatory Visit | Attending: Internal Medicine | Admitting: Internal Medicine

## 2019-05-08 ENCOUNTER — Other Ambulatory Visit: Payer: Self-pay

## 2019-05-08 DIAGNOSIS — C712 Malignant neoplasm of temporal lobe: Secondary | ICD-10-CM

## 2019-05-08 MED ORDER — GADOBENATE DIMEGLUMINE 529 MG/ML IV SOLN
17.0000 mL | Freq: Once | INTRAVENOUS | Status: AC | PRN
  Administered 2019-05-08: 12:00:00 17 mL via INTRAVENOUS

## 2019-05-11 ENCOUNTER — Other Ambulatory Visit: Payer: Self-pay

## 2019-05-11 ENCOUNTER — Ambulatory Visit: Payer: 59 | Admitting: Occupational Therapy

## 2019-05-11 ENCOUNTER — Encounter: Payer: Self-pay | Admitting: Occupational Therapy

## 2019-05-11 ENCOUNTER — Ambulatory Visit: Payer: 59 | Admitting: Physical Therapy

## 2019-05-11 DIAGNOSIS — R2681 Unsteadiness on feet: Secondary | ICD-10-CM

## 2019-05-11 DIAGNOSIS — R29818 Other symptoms and signs involving the nervous system: Secondary | ICD-10-CM

## 2019-05-11 DIAGNOSIS — G8191 Hemiplegia, unspecified affecting right dominant side: Secondary | ICD-10-CM

## 2019-05-11 DIAGNOSIS — M6281 Muscle weakness (generalized): Secondary | ICD-10-CM

## 2019-05-11 DIAGNOSIS — R41842 Visuospatial deficit: Secondary | ICD-10-CM

## 2019-05-11 DIAGNOSIS — R482 Apraxia: Secondary | ICD-10-CM

## 2019-05-11 NOTE — Therapy (Signed)
[facility] [address] [place], [place], [address] Phone: [phone]   Fax:  [phone]  Occupational Therapy Treatment  Patient Details  Name: Gregory Moreno MRN: [id number] Date of Birth: 30-May-1995 Referring Provider (OT): Marlowe Shores   Encounter Date: 05/11/2019  OT End of Session - 05/11/19 1800    Visit Number  38    Number of Visits  41    Date for OT Re-Evaluation  05/17/19    Authorization Type  No VL - No auth required    OT Start Time  1417    OT Stop Time  1501    OT Time Calculation (min)  44 min    Activity Tolerance  Patient tolerated treatment well       History reviewed. No pertinent past medical history.  Past Surgical History:  Procedure Laterality Date  . CRANIOTOMY Left 10/13/2018   Procedure: LEFT CRANIOTOMY FOR TUMOR RESECTION;  Surgeon: Judith Part, MD;  Location: [facility];  Service: Neurosurgery;  Laterality: Left;    There were no vitals filed for this visit.  Subjective Assessment - 05/11/19 1753    Subjective   What do you want (pt with aphasia and is apraxic)    Patient is accompanied by:  Family member   mom   Pertinent History  Glioblastoma    Currently in Pain?  No/denies       Patient seen for aquatic therapy today.  Treatment took place in water 2.5-4 feet deep depending upon activity.  Pt entered the pool via ramp using 1 hand railing, air cast on R ankle for alignment and protection and mod facilitation for postural alignment and control. Pt with slowly improving ability to perform anterior lateral weight shift onto RLE with mod facilitation and max cues.  Once in water pt able to perform functional ambulation in open water first with light single hand held assist and then with no UE support with moderate facilitation.  Utilized aquastretch techniques to address R hip extension to allow for more upright posture during stance on RLE. Also addressed active hip  extension with short lever arm (knee flexed) - pt needs max cues and mod facilitation to isolate hip extension.  Transitioned into supine using floatation devices and utilized limited [place] to decrease spasticity in RLE, trunk and RUE with good results. In supine also utilized aqua stretch techniques to address R shoulder girdle alignment followed by demand for active ab/adduction of RUE with elbow extension - pt able to initiate abduction however unable to initiate adduction - likely due to apraxia and poor sensation.  Transitioned into standing addressed side stepping and  to the R and single leg squatting to stand to squat (RLE) with emphasis on full active engagement of RLE, as well as working on actively aligning hip and shoulder over RLE with weight shifting while incorporating RUE on bar.  Addressed backward walking to address postural alignment and control as well as to actively address R hip extension.  Pt needs max cues and mod facilitation for all activities. Pt exited the pool via ramp and 1 hand railing and mod facilitation and max cues. Pt is demonstrating improved R knee and hip control and improved alignment on land with R AFO on resulting in decreased spasticity of RUE/ RLE and improved dynamic standing balance.                         OT Short Term  Goals - 05/11/19 1757      OT SHORT TERM GOAL #1   Title  Patient will complete an HEP designed to improve active motion in RUE due 01/16/19    Status  Achieved      OT SHORT TERM GOAL #2   Title  Patient will reach forward with shoulder flexion, elbow extension pattern to make contact with target in midline and 8-12' from chest with intermittent assist    Status  Achieved      OT SHORT TERM GOAL #3   Title  Patient will transfer from wheelchair to commode with no more than supervision assistance - transferring either toward right or toward left side    Status  Achieved      OT SHORT TERM GOAL #4   Title  Patient  will grasp and release a cylindrical object 2-3" in diameter with min assist    Status  Achieved      OT SHORT TERM GOAL #5   Title  Patient will dress lower body with mod assist    Status  Achieved      OT SHORT TERM GOAL #6   Title  Patient will don/doff underwear, pants, and left shoe with min assist    Status  Achieved      OT SHORT TERM GOAL #7   Title  Patient will complete shower transfer with supervision    Status  Achieved      OT SHORT TERM GOAL #8   Title  Patient will bathe self in shower with supervision and cueing assistance    Status  Achieved        OT Long Term Goals - 05/11/19 1757      OT LONG TERM GOAL #1   Title  Patient will dress himself with no greater than set up assistance - 05/15/19    Status  Achieved      OT LONG TERM GOAL #2   Title  Patient will shower with supervision    Status  Achieved      OT LONG TERM GOAL #3   Title  Patient will cut food on plate with modified independence    Status  Achieved      OT LONG TERM GOAL #4   Title  Patient will demonstrate ability to retrieve a lightweight item (less than 2lb) from waist height or lower and transport 6-12 inches laterally with RUE    Status  On-going      OT LONG TERM GOAL #5   Title  Patient will demonstrate RUE  mid level reach with minimal LUE support to hit a target at chest height directly in front of body    Status  On-going      OT LONG TERM GOAL #6   Title  Patient will demonstrate adequate postural control in standing to reach for an object outside of his base of support (right) at waist height or lower with right hand and min assistance without significant loss of balance.    Status  On-going            Plan - 05/11/19 1759    Clinical Impression Statement  Pt demonstrating improved postural alignment and controlled, improved awareness of RLE with functional mobility and resulting decreased spasticity in RUE    OT Occupational Profile and History  Comprehensive  Assessment- Review of records and extensive additional review of physical, cognitive, psychosocial history related to current functional performance    Occupational performance deficits (Please refer  to evaluation for details):  ADL's;IADL's;Rest and Sleep;Education;Work;Play;Leisure;Social Participation    Body Structure / Function / Physical Skills  ADL;Decreased knowledge of use of DME;Gait;Strength;Balance;Dexterity;GMC;Tone;Body mechanics;Hearing;Proprioception;UE functional use;Endurance;IADL;ROM;Vestibular;Vision;Coordination;Flexibility;Mobility;Sensation;FMC;Decreased knowledge of precautions    Cognitive Skills  Attention;Problem Solve;Emotional;Safety Awareness;Sequencing;Memory;Learn;Thought;Understand;Perception    Psychosocial Skills  Routines and Behaviors;Interpersonal Interaction    Rehab Potential  Good    Clinical Decision Making  Multiple treatment options, significant modification of task necessary    Comorbidities Affecting Occupational Performance:  May have comorbidities impacting occupational performance    Modification or Assistance to Complete Evaluation   Min-Moderate modification of tasks or assist with assess necessary to complete eval    OT Frequency  2x / week    OT Duration  8 weeks    OT Treatment/Interventions  Self-care/ADL training;DME and/or AE instruction;Splinting;Balance training;Aquatic Therapy;Therapeutic activities;Therapeutic exercise;Cognitive remediation/compensation;Neuromuscular education;Functional Mobility Training;Visual/perceptual remediation/compensation;Electrical Stimulation;Manual Therapy;Patient/family education    Plan  Postural control, more consistent activation in right leg in standing, right arm to work out of modified closed chain toward partially open chain. aquatic therapy to address postural alignment and control, NMR for R side    Consulted and Agree with Plan of Care  Patient;Family member/caregiver    Family Member Consulted  mom        Patient will benefit from skilled therapeutic intervention in order to improve the following deficits and impairments:   Body Structure / Function / Physical Skills: ADL, Decreased knowledge of use of DME, Gait, Strength, Balance, Dexterity, GMC, Tone, Body mechanics, Hearing, Proprioception, UE functional use, Endurance, IADL, ROM, Vestibular, Vision, Coordination, Flexibility, Mobility, Sensation, FMC, Decreased knowledge of precautions Cognitive Skills: Attention, Problem Solve, Emotional, Safety Awareness, Sequencing, Memory, Learn, Thought, Understand, Perception Psychosocial Skills: Routines and Behaviors, Interpersonal Interaction   Visit Diagnosis: Hemiplegia affecting right dominant side, unspecified etiology, unspecified hemiplegia type (HCC)  Unsteadiness on feet  Muscle weakness (generalized)  Other symptoms and signs involving the nervous system  Apraxia  Visuospatial deficit    Problem List Patient Active Problem List   Diagnosis Date Noted  . Goals of care, counseling/discussion 12/16/2018  . Palliative care by specialist   . Glioblastoma multiforme of temporal lobe ([facility]) 11/11/2018  . Spastic hemiparesis ([facility])   . Cerebral edema (HCC)   . Intracranial tumor ([facility])   . Steroid-induced hyperglycemia   . Spastic hemiplegia affecting nondominant side ([facility])   . Hyponatremia   . Transaminitis   . Leucocytosis   . Right spastic hemiplegia ([facility]) 10/21/2018  . Dysphagia 10/21/2018  . 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 Brain mass   . ICH (intracerebral hemorrhage) ([facility]) 10/13/2018    Quay Burow, OTR/L 05/11/2019, 6:02 PM  [facility] [address] [place], [place], [address] Phone: [phone]   Fax:  [phone]  Name: Gregory Moreno MRN: [id number] Date of Birth: 25-Jun-1995

## 2019-05-13 ENCOUNTER — Inpatient Hospital Stay: Payer: 59

## 2019-05-13 ENCOUNTER — Ambulatory Visit: Payer: 59

## 2019-05-13 ENCOUNTER — Ambulatory Visit: Payer: 59 | Admitting: Occupational Therapy

## 2019-05-13 ENCOUNTER — Other Ambulatory Visit: Payer: Self-pay

## 2019-05-13 ENCOUNTER — Ambulatory Visit: Payer: 59 | Admitting: Physical Therapy

## 2019-05-13 ENCOUNTER — Encounter: Payer: Self-pay | Admitting: Occupational Therapy

## 2019-05-13 DIAGNOSIS — R471 Dysarthria and anarthria: Secondary | ICD-10-CM

## 2019-05-13 DIAGNOSIS — M6281 Muscle weakness (generalized): Secondary | ICD-10-CM

## 2019-05-13 DIAGNOSIS — G8191 Hemiplegia, unspecified affecting right dominant side: Secondary | ICD-10-CM | POA: Diagnosis not present

## 2019-05-13 DIAGNOSIS — R29818 Other symptoms and signs involving the nervous system: Secondary | ICD-10-CM

## 2019-05-13 DIAGNOSIS — R482 Apraxia: Secondary | ICD-10-CM

## 2019-05-13 DIAGNOSIS — R4701 Aphasia: Secondary | ICD-10-CM

## 2019-05-13 DIAGNOSIS — R2681 Unsteadiness on feet: Secondary | ICD-10-CM

## 2019-05-14 ENCOUNTER — Inpatient Hospital Stay: Payer: 59 | Admitting: Internal Medicine

## 2019-05-14 ENCOUNTER — Inpatient Hospital Stay: Payer: 59

## 2019-05-14 ENCOUNTER — Other Ambulatory Visit: Payer: Self-pay

## 2019-05-14 ENCOUNTER — Telehealth: Payer: Self-pay | Admitting: Internal Medicine

## 2019-05-14 VITALS — BP 120/70 | HR 87 | Temp 98.3°F | Resp 18 | Ht 69.0 in | Wt 183.7 lb

## 2019-05-14 DIAGNOSIS — C712 Malignant neoplasm of temporal lobe: Secondary | ICD-10-CM | POA: Diagnosis not present

## 2019-05-14 LAB — CMP (CANCER CENTER ONLY)
ALT: 11 U/L (ref 0–44)
AST: 17 U/L (ref 15–41)
Albumin: 3.9 g/dL (ref 3.5–5.0)
Alkaline Phosphatase: 68 U/L (ref 38–126)
Anion gap: 6 (ref 5–15)
BUN: 8 mg/dL (ref 6–20)
CO2: 26 mmol/L (ref 22–32)
Calcium: 9.8 mg/dL (ref 8.9–10.3)
Chloride: 109 mmol/L (ref 98–111)
Creatinine: 0.87 mg/dL (ref 0.61–1.24)
GFR, Est AFR Am: >60 mL/min (ref >60)
GFR, Estimated: >60 mL/min (ref >60)
Glucose, Bld: 82 mg/dL (ref 70–99)
Potassium: 3.9 mmol/L (ref 3.5–5.1)
Sodium: 141 mmol/L (ref 135–145)
Total Bilirubin: 0.3 mg/dL (ref 0.3–1.2)
Total Protein: 8.1 g/dL (ref 6.5–8.1)

## 2019-05-14 LAB — CBC WITH DIFFERENTIAL (CANCER CENTER ONLY)
Abs Immature Granulocytes: 0.02 10*3/uL (ref 0.00–0.07)
Basophils Absolute: 0 10*3/uL (ref 0.0–0.1)
Basophils Relative: 0 %
Eosinophils Absolute: 0 10*3/uL (ref 0.0–0.5)
Eosinophils Relative: 1 %
HCT: 36.8 % — ABNORMAL LOW (ref 39.0–52.0)
Hemoglobin: 12.2 g/dL — ABNORMAL LOW (ref 13.0–17.0)
Immature Granulocytes: 0 %
Lymphocytes Relative: 13 %
Lymphs Abs: 0.6 10*3/uL — ABNORMAL LOW (ref 0.7–4.0)
MCH: 30.4 pg (ref 26.0–34.0)
MCHC: 33.2 g/dL (ref 30.0–36.0)
MCV: 91.8 fL (ref 80.0–100.0)
Monocytes Absolute: 0.4 10*3/uL (ref 0.1–1.0)
Monocytes Relative: 9 %
Neutro Abs: 3.4 10*3/uL (ref 1.7–7.7)
Neutrophils Relative %: 77 %
Platelet Count: 257 10*3/uL (ref 150–400)
RBC: 4.01 MIL/uL — ABNORMAL LOW (ref 4.22–5.81)
RDW: 16.5 % — ABNORMAL HIGH (ref 11.5–15.5)
WBC Count: 4.5 10*3/uL (ref 4.0–10.5)
nRBC: 0 % (ref 0.0–0.2)

## 2019-05-14 MED ORDER — ONDANSETRON HCL 4 MG PO TABS: 4.0000 mg | ORAL_TABLET | Freq: Three times a day (TID) | ORAL | 1 refills | Status: DC | PRN

## 2019-05-14 NOTE — Progress Notes (Signed)
[facility] [place], [place] [address] [phone]   Interval Evaluation  Date of Service: 05/14/19 Patient Name: Gregory Moreno Patient MRN: [id number] Patient DOB: 1995/07/22 Provider: Ventura Sellers, MD  Identifying Statement:  Gregory Moreno is a 24 y.o. male with left temporal glioblastoma   Referring Provider: [facility] [address] [place],  [place] [address]  Oncologic History: Oncology History  Glioblastoma multiforme of temporal lobe ([facility])  11/11/2018 Initial Diagnosis   Glioblastoma multiforme of temporal lobe ([facility])   11/25/2018 - 12/01/2018 Chemotherapy   The patient had [No matching medication found in this treatment plan]  for chemotherapy treatment.    02/09/2019 -  Chemotherapy   The patient had [No matching medication found in this treatment plan]  for chemotherapy treatment.      Biomarkers:  MGMT Unknown.  IDH 1/2 Wild type.  EGFR Unknown  TERT Unknown   Interval History:  Gregory Moreno presents today for follow up following recent MRI brain.  Right side strength continues to improve, he is ambulating consistently with a cane into clinic today.  Remains with dense left visual field impairment.  Language has been modestly improved; still has good understanding but struggles to put longer sentences together.  Currently on day 12 of Temodar.  Medications: Current Outpatient Medications on File Prior to Visit  Medication Sig Dispense Refill  . escitalopram (LEXAPRO) 5 MG tablet TAKE 1 TABLET BY MOUTH EVERYDAY AT BEDTIME 90 tablet 1  . ondansetron (ZOFRAN) 4 MG tablet Take 1 tablet (4 mg total) by mouth every 8 (eight) hours as needed for nausea or vomiting. 30 tablet 1  . ondansetron (ZOFRAN) 8 MG tablet TAKE 1 TABLET BY MOUTH TWICE DAILY AS NEEDED FOR NAUSEA AND VOMITING. MAY TAKE 30-60 MINUTES PRIOR TO TEMODAR ADMINISTRATION IF NAUSEA/VOMITING OCCURS.  (Patient not taking: Reported on 05/14/2019) 30 tablet 0  . temozolomide (TEMODAR) 100 MG capsule Take 4 capsules (400 mg total) by mouth daily. May take on an empty stomach to decrease nausea & vomiting. (Patient not taking: Reported on 05/14/2019) 20 capsule 0  . temozolomide (TEMODAR) 100 MG capsule Take 3 capsules (300 mg total) by mouth daily. May take on an empty stomach to decrease nausea & vomiting. (Patient not taking: Reported on 05/14/2019) 15 capsule 0  . [DISCONTINUED] escitalopram (LEXAPRO) 5 MG tablet TAKE 1 TABLET BY MOUTH EVERYDAY AT BEDTIME 30 tablet 0   No current facility-administered medications on file prior to visit.    Allergies: No Known Allergies Past Medical History: No past medical history on file. Past Surgical History:  Past Surgical History:  Procedure Laterality Date  . CRANIOTOMY Left 10/13/2018   Procedure: LEFT CRANIOTOMY FOR TUMOR RESECTION;  Surgeon: Judith Part, MD;  Location: [facility];  Service: Neurosurgery;  Laterality: Left;   Social History:  Social History   Socioeconomic History  . Marital status: Single    Spouse name: Not on file  . Number of children: Not on file  . Years of education: Not on file  . Highest education level: Not on file  Occupational History  . Not on file  Tobacco Use  . Smoking status: Never Smoker  . Smokeless tobacco: Never Used  Substance and Sexual Activity  . Alcohol use: Not on file  . Drug use: Not on file  . Sexual activity: Not on file  Other Topics Concern  . Not on file  Social History Narrative  . Not on file   Social Determinants of Health   Financial Resource Strain:   . Difficulty of Paying Living Expenses:   Food Insecurity:   . Worried About Charity fundraiser in the Last Year:   . Arboriculturist in the Last Year:   Transportation Needs:   . Film/video editor (Medical):   Marland Kitchen Lack of Transportation (Non-Medical):   Physical Activity:   . Days of Exercise per Week:   . Minutes  of Exercise per Session:   Stress:   . Feeling of Stress :   Social Connections:   . Frequency of Communication with Friends and Family:   . Frequency of Social Gatherings with Friends and Family:   . Attends Religious Services:   . Active Member of Clubs or Organizations:   . Attends Archivist Meetings:   Marland Kitchen Marital Status:   Intimate Partner Violence:   . Fear of Current or Ex-Partner:   . Emotionally Abused:   Marland Kitchen Physically Abused:   . Sexually Abused:    Family History: No family history on file.  Review of Systems: Constitutional: Denies fevers, chills or abnormal weight loss Eyes: Denies blurriness of vision Ears, nose, mouth, throat, and face: Denies mucositis or sore throat Respiratory: Denies cough, dyspnea or wheezes Cardiovascular: Denies palpitation, chest discomfort or lower extremity swelling Gastrointestinal:  Denies nausea, constipation, diarrhea GU: Denies dysuria or incontinence Skin: Denies abnormal skin rashes Neurological: Per HPI Musculoskeletal: Denies joint pain, back or neck discomfort. No decrease in ROM Behavioral/Psych: Denies anxiety, disturbance in thought content, and mood instability  Physical Exam: Vitals:   05/14/19 1200  BP: 120/70  Pulse: 87  Resp: 18  Temp: 98.3 F (36.8 C)  SpO2: 100%   KPS: 60. General: Alert, cooperative, pleasant, in no acute distress Head: Craniotomy scar noted, dry and intact. EENT: No conjunctival injection or scleral icterus. Oral mucosa moist Lungs: Resp effort normal Cardiac: Regular rate and rhythm Abdomen: Soft, non-distended abdomen Skin: No rashes cyanosis or petechiae. Extremities: No clubbing or edema  Neurologic Exam: Mental Status: Awake, alert, attentive to examiner. Oriented to self and environment. Language is densely impaired with regards to fluency.  Comprehension impaired to multi-step complex commands.  Simple repetition is preserved. Cranial Nerves: Visual acuity is grossly  normal. Dense R homonymous hemianopia. Extra-ocular movements intact. No ptosis. Face is symmetric, tongue midline. Motor: Tone and bulk are normal. Power is 3/5 in right arm and leg, 5/5 on left side. Reflexes are symmetric, no pathologic reflexes present. Sensory: Intact to light touch and temperature Gait: Deferred  Labs: I have reviewed the data as listed    Component Value Date/Time   NA 141 05/14/2019 1136   K 3.9 05/14/2019 1136   CL 109 05/14/2019 1136   CO2 26 05/14/2019 1136   GLUCOSE 82 05/14/2019 1136   BUN 8 05/14/2019 1136   CREATININE 0.87 05/14/2019 1136   CALCIUM 9.8 05/14/2019 1136   PROT 8.1 05/14/2019 1136   ALBUMIN 3.9 05/14/2019 1136   AST 17 05/14/2019 1136   ALT 11 05/14/2019 1136   ALKPHOS 68 05/14/2019 1136   BILITOT 0.3 05/14/2019 1136   GFRNONAA >60 05/14/2019 1136   GFRAA >60 05/14/2019 1136   Lab Results  Component Value Date   WBC 4.5 05/14/2019   NEUTROABS 3.4 05/14/2019   HGB 12.2 (L) 05/14/2019   HCT 36.8 (L) 05/14/2019   MCV 91.8 05/14/2019   PLT 257 05/14/2019  Imaging:  [facility] Clinician Interpretation: I have personally reviewed the CNS images as listed.  My interpretation, in the context of the patient's clinical presentation, is stable disease  MR BRAIN W WO CONTRAST  Result Date: 05/09/2019 CLINICAL DATA:  24 year old male with left temporal glioblastoma status post resection in September 2020, postoperative radiation using IMRT, status post chemotherapy. Increased left internal capsule enhancement last month, although at the same time the patient's clinical deficits were improving. Restaging. EXAM: MRI HEAD WITHOUT AND WITH CONTRAST TECHNIQUE: Multiplanar, multiecho pulse sequences of the brain and surrounding structures were obtained without and with intravenous contrast. CONTRAST:  27m MULTIHANCE GADOBENATE DIMEGLUMINE 529 MG/ML IV SOLN COMPARISON:  04/10/2019 and earlier. FINDINGS: Brain: Sequelae of left frontotemporal craniotomy  with cystic and heterogeneous left temporal lobe resection and encephalomalacia. On DWI a curvilinear area of restricted diffusion appears stable and probably in part due to hemosiderin susceptibility artifact. Nodular and solid appearing enhancement at the residual posterior limb left internal capsule is stable or perhaps slightly regressed since 04/10/2019 (series 24, image 68 today), although remains increased compared to 02/06/2019. FLAIR signal hyperintensity in the region has also mildly increased since January (left cerebral peduncle series 11, image 13), but remains stable. 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 A conspicuous rounded area of T2 and FLAIR heterogeneous parenchyma at the residual left temporal para hippocampal gyrus is stable in size, without suspicious diffusion signal, and with what appears to be intrinsic T1 signal heterogeneity (series 14, image 60) rather than faint enhancement. No increasing T2 hyperintensity in the left hemisphere. No superimposed restricted diffusion to suggest acute infarction. No midline shift, mass effect, unexpected ventriculomegaly, or acute intracranial hemorrhage. GPearline Cablesand white matter signal in the right hemisphere and cerebellum remains normal. Cervicomedullary junction and pituitary are within normal limits. Smooth dural thickening underlying the craniotomy in the left hemisphere is stable and appears to be postoperative. No new abnormal enhancement. Vascular: Major intracranial vascular flow voids are stable. The major dural venous sinuses are enhancing and appear to be patent. Skull and upper cervical spine: Stable and negative visible cervical spine and spinal cord. Visualized bone marrow signal is within normal limits. Sinuses/Orbits: Negative orbits. Paranasal sinuses and mastoids are stable and well  pneumatized. Other: Stable scalp soft tissues. IMPRESSION: Solid, nodular enhancement in the residual posterior limb left internal capsule is stable or slightly regressed since last month, although does remain increased compared to January. 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 No new suspicious findings. Continued short interval MRI follow-up may be valuable. Electronically Signed   By: HGenevie AnnM.D.   On: 05/09/2019 08:14     Assessment/Plan Glioblastoma multiforme of temporal lobe ([place] [C71.2]   ABlenda Mountsis clinically and radiographically stable today.  He is on day 12/28 of cycle #3 adjuvant 5-day TMZ.   We recommended continuing treatment with Temozolomide 200 mg/m2, on for five days and off for twenty three days in twenty eight day cycles. The patient will have a complete blood count performed on days 21 and 28 of each cycle, and a comprehensive metabolic panel performed on day 28 of each cycle. Labs may need to be performed more often. Zofran will prescribed for home use for nausea/vomiting.   Chemotherapy should be held for the following:  ANC less than 1,000  Platelets less than 100,000  LFT or creatinine greater than 2x ULN  If clinical concerns/contraindications develop  Clinically, we continue to be very pleased with his functional improvement through aggressive neuro-directed PT and OT.  We will follow up with him in 2-3 weeks prior to cycle #4 with labs for review.  All questions were answered. The patient knows to call the clinic with any problems, questions or concerns. No barriers to learning were detected.  The total time spent in the encounter was 40 minutes and more than 50% was on counseling and review of test results   Ventura Sellers, MD Medical Director of Neuro-Oncology [facility] 05/14/19 1:08 PM

## 2019-05-14 NOTE — Therapy (Signed)
[facility] [address] [place], [place], [address] Phone: [phone]   Fax:  [phone]  Occupational Therapy Treatment  Patient Details  Name: Gregory Moreno MRN: [id number] Date of Birth: Jul 04, 1995 Referring Provider (OT): Marlowe Shores   Encounter Date: 05/13/2019  OT End of Session - 05/14/19 1349    Visit Number  39    Number of Visits  41    Date for OT Re-Evaluation  05/17/19    Authorization Type  No VL - No auth required    OT Start Time  1705    OT Stop Time  1745    OT Time Calculation (min)  40 min    Activity Tolerance  Patient tolerated treatment well       History reviewed. No pertinent past medical history.  Past Surgical History:  Procedure Laterality Date  . CRANIOTOMY Left 10/13/2018   Procedure: LEFT CRANIOTOMY FOR TUMOR RESECTION;  Surgeon: Judith Part, MD;  Location: [facility];  Service: Neurosurgery;  Laterality: Left;    There were no vitals filed for this visit.  Subjective Assessment - 05/13/19 1708    Subjective   Pt asked if therapist knew [employer] and pink floyd    Patient is accompanied by:  Family member   mom   Pertinent History  Glioblastoma    Currently in Pain?  No/denies           Treatment: Closed chain chest press and shoulder flexion followed by prone on elbows lifting chest, then  pt transitioned into sidelying for weightbearing though right elbow while performing body on arm movements and trunk flexion for increased strength and scapular control. Seated edge of mat low range bilateral shoulder flexion with large physioball.                  OT Short Term Goals - 05/11/19 1757      OT SHORT TERM GOAL #1   Title  Patient will complete an HEP designed to improve active motion in RUE due 01/16/19    Status  Achieved      OT SHORT TERM GOAL #2   Title  Patient will reach forward with shoulder flexion, elbow extension pattern to make contact with  target in midline and 8-12' from chest with intermittent assist    Status  Achieved      OT SHORT TERM GOAL #3   Title  Patient will transfer from wheelchair to commode with no more than supervision assistance - transferring either toward right or toward left side    Status  Achieved      OT SHORT TERM GOAL #4   Title  Patient will grasp and release a cylindrical object 2-3" in diameter with min assist    Status  Achieved      OT SHORT TERM GOAL #5   Title  Patient will dress lower body with mod assist    Status  Achieved      OT SHORT TERM GOAL #6   Title  Patient will don/doff underwear, pants, and left shoe with min assist    Status  Achieved      OT SHORT TERM GOAL #7   Title  Patient will complete shower transfer with supervision    Status  Achieved      OT SHORT TERM GOAL #8   Title  Patient will bathe self in shower with supervision and cueing assistance    Status  Achieved  OT Long Term Goals - 05/11/19 1757      OT LONG TERM GOAL #1   Title  Patient will dress himself with no greater than set up assistance - 05/15/19    Status  Achieved      OT LONG TERM GOAL #2   Title  Patient will shower with supervision    Status  Achieved      OT LONG TERM GOAL #3   Title  Patient will cut food on plate with modified independence    Status  Achieved      OT LONG TERM GOAL #4   Title  Patient will demonstrate ability to retrieve a lightweight item (less than 2lb) from waist height or lower and transport 6-12 inches laterally with RUE    Status  On-going      OT LONG TERM GOAL #5   Title  Patient will demonstrate RUE  mid level reach with minimal LUE support to hit a target at chest height directly in front of body    Status  On-going      OT LONG TERM GOAL #6   Title  Patient will demonstrate adequate postural control in standing to reach for an object outside of his base of support (right) at waist height or lower with right hand and min assistance without  significant loss of balance.    Status  On-going            Plan - 05/14/19 1349    Clinical Impression Statement  Pt continues to progress towards goals.    OT Occupational Profile and History  Comprehensive Assessment- Review of records and extensive additional review of physical, cognitive, psychosocial history related to current functional performance    Occupational performance deficits (Please refer to evaluation for details):  ADL's;IADL's;Rest and Sleep;Education;Work;Play;Leisure;Social Participation    Body Structure / Function / Physical Skills  ADL;Decreased knowledge of use of DME;Gait;Strength;Balance;Dexterity;GMC;Tone;Body mechanics;Hearing;Proprioception;UE functional use;Endurance;IADL;ROM;Vestibular;Vision;Coordination;Flexibility;Mobility;Sensation;FMC;Decreased knowledge of precautions    Cognitive Skills  Attention;Problem Solve;Emotional;Safety Awareness;Sequencing;Memory;Learn;Thought;Understand;Perception    Psychosocial Skills  Routines and Behaviors;Interpersonal Interaction    Rehab Potential  Good    Clinical Decision Making  Multiple treatment options, significant modification of task necessary    Comorbidities Affecting Occupational Performance:  May have comorbidities impacting occupational performance    Modification or Assistance to Complete Evaluation   Min-Moderate modification of tasks or assist with assess necessary to complete eval    OT Frequency  2x / week    OT Duration  8 weeks    OT Treatment/Interventions  Self-care/ADL training;DME and/or AE instruction;Splinting;Balance training;Aquatic Therapy;Therapeutic activities;Therapeutic exercise;Cognitive remediation/compensation;Neuromuscular education;Functional Mobility Training;Visual/perceptual remediation/compensation;Electrical Stimulation;Manual Therapy;Patient/family education    Plan  Postural control, more consistent activation in right leg in standing, right arm to work out of modified closed  chain toward partially open chain. aquatic therapy to address postural alignment and control, NMR for R side    Consulted and Agree with Plan of Care  Patient;Family member/caregiver    Family Member Consulted  mom       Patient will benefit from skilled therapeutic intervention in order to improve the following deficits and impairments:   Body Structure / Function / Physical Skills: ADL, Decreased knowledge of use of DME, Gait, Strength, Balance, Dexterity, GMC, Tone, Body mechanics, Hearing, Proprioception, UE functional use, Endurance, IADL, ROM, Vestibular, Vision, Coordination, Flexibility, Mobility, Sensation, FMC, Decreased knowledge of precautions Cognitive Skills: Attention, Problem Solve, Emotional, Safety Awareness, Sequencing, Memory, Learn, Thought, Understand, Perception Psychosocial Skills: Routines and Behaviors, Interpersonal  Interaction   Visit Diagnosis: Hemiplegia affecting right dominant side, unspecified etiology, unspecified hemiplegia type ([facility])  Muscle weakness (generalized)  Other symptoms and signs involving the nervous system    Problem List Patient Active Problem List   Diagnosis Date Noted  . Goals of care, counseling/discussion 12/16/2018  . Palliative care by specialist   . Glioblastoma multiforme of temporal lobe ([facility]) 11/11/2018  . Spastic hemiparesis ([facility])   . Cerebral edema ([facility])   . Intracranial tumor ([facility])   . Steroid-induced hyperglycemia   . Spastic hemiplegia affecting nondominant side ([facility])   . Hyponatremia   . Transaminitis   . Leucocytosis   . Right spastic hemiplegia ([facility]) 10/21/2018  . Dysphagia 10/21/2018  . 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 Brain mass   . ICH (intracerebral hemorrhage) ([facility]) 10/13/2018    Brezlyn Manrique 05/14/2019, 1:50 PM  [facility] [address] [place], [place], [address] Phone: [phone]   Fax:  [phone]  Name: Gregory Moreno MRN: [id number] Date of Birth: [birth year]

## 2019-05-14 NOTE — Telephone Encounter (Signed)
Scheduled appt per 4/29 los.  Left a vm of the appt daete and time.

## 2019-05-14 NOTE — Therapy (Signed)
[facility] [address] [place], [place], [address] Phone: [phone]   Fax:  [phone]  Physical Therapy Treatment/Re-Cert  Patient Details  Name: Gregory Moreno MRN: [id number] Date of Birth: 1995-09-09 Referring Provider (PT): Alger Simons, MD   Encounter Date: 05/13/2019  PT End of Session - 05/14/19 0842    Visit Number  37    Number of Visits  41    Date for PT Re-Evaluation  06/13/19    Authorization Type  [employer] - 2021, no VL, $40 copay per therapy per day    PT Start Time  1618    PT Stop Time  1702    PT Time Calculation (min)  44 min    Activity Tolerance  Patient tolerated treatment well    Behavior During Therapy  [facility] for tasks assessed/performed       No past medical history on file.  Past Surgical History:  Procedure Laterality Date  . CRANIOTOMY Left 10/13/2018   Procedure: LEFT CRANIOTOMY FOR TUMOR RESECTION;  Surgeon: Judith Part, MD;  Location: [facility];  Service: Neurosurgery;  Laterality: Left;    There were no vitals filed for this visit.  Subjective Assessment - 05/13/19 1624    Subjective  Had MRI done on Friday - waiting to hear back from Dr. [name]. Started chemo again last week through Thursday. Going on vacation next Monday for 2 weeks! Went to work with mom yesterday and worked on some [employer]. Has been walking in the yard some. Pool is going great!    Limitations  Sitting;Walking;Standing    How long can you walk comfortably?  100' with hemiwalker    Patient Stated Goals  wants to improve his walking, get back to normal    Currently in Pain?  No/denies                       [facility] Adult PT Treatment/Exercise - 05/14/19 0001      Transfers   Transfers  Sit to Stand;Stand to Sit;Stand Pivot Transfers    Sit to Stand  5: Supervision    Stand to Sit  5: Supervision    Stand Pivot Transfers  5: Supervision    Stand Pivot Transfer Details (indicate cue  type and reason)  with quad cane      Ambulation/Gait   Ambulation/Gait  Yes    Ambulation/Gait Assistance  4: Min guard;5: Supervision;4: Min assist    Ambulation/Gait Assistance Details  Trialed Thuasne SpryStep posterior AFO (as recommended by orthotist) with initially with thicker heel wedge. Pt demonstrating improved postural alignment, decreased forceful recurvatum, decr toe drag, and improved foot clearance. [name] heel was moving in and out of shoe due to higher wedge, trialed a smaller heel wedge with pt reporting it feeling more comfortable and that heel was not coming in and out. Pt still demonstrating improved gait mechanics and had no episodes of toe drag, with the exception towards end of session when more fatigued pt with more difficulty clearing foot. Videotaped pt's gait with pt also reporting that he "looks pretty good". Also practiced gait outdoors over paved surfaces with min guard when ascending paved inclines, close min guard when performed gait over unlevel grass surfaces, gravel, and mulch with pt demonstrating no LOB. Performed an additional 1 lap of gait indoors with therapist providing downward pressure through pt's R pelvis during stance for closed chain hip ABD with pt demonstrating improvement in foot clearance and  decr genu recurvatum.     Ambulation Distance (Feet)  400 Feet   approx. throughout session   Assistive device  Small based quad cane   R AFO    Gait Pattern  Step-through pattern;Decreased hip/knee flexion - right;Decreased dorsiflexion - right;Right genu recurvatum;Poor foot clearance - right    Ambulation Surface  Level;Indoor    Stairs  Yes    Stairs Assistance  4: Min guard;4: Min assist    Stairs Assistance Details (indicate cue type and reason)  needed min A to clear RLE when ascending due to lip on the stairs, pt continues with R hyperextension when ascending with LLE, needed initial verbal cues on proper technique to descend stairs, pt with more control  descending today with less hip ADD on R      Stair Management Technique  One rail Left;Step to pattern;Forwards    Number of Stairs  4    Height of Stairs  6    Curb  --   min guard   Curb Details (indicate cue type and reason)  x3 reps    Pre-Gait Activities  cues for technique to descend with RLE first, trialed ascending curb both with ascending with RLE and LLE, when ascending with LLE pt clears RLE with more compensatory hip hiking vs. hip/knee flexion but required min guard, when ascending with RLE pt needing min A for balance       Therapeutic Activites    Therapeutic Activities  Other Therapeutic Activities    Other Therapeutic Activities  provided pt's mom number for [facility] to call to make an appointment with [name] after getting back from vacation for AFO due to pt having good results with Geoffery Lyons Step posterior AFO             PT Education - 05/14/19 0841    Education Details  trialed Spry Step AFO with good results, discussed about making an appointment with [facility] at [place] (after returning from vacation) to have brace ordered for pt - provided pt's mom with number to call [facility] and therapist to make [facility] aware about results with SpryStep AFO    Person(s) Educated  Patient;Parent(s)    Methods  Explanation;Demonstration    Comprehension  Verbalized understanding       PT Short Term Goals - 05/14/19 0847      PT SHORT TERM GOAL #1   Title  ALL STGS = LTGS        PT Long Term Goals - 05/14/19 0845      PT LONG TERM GOAL #1   Title  Patient and pt's family will be independent with final HEP designed to improve movement and strength in RLE.  ALL LTGS DUE 05/12/19    Baseline  on-going    Time  8    Period  Weeks    Status  Achieved      PT LONG TERM GOAL #2   Title  Pt will ambulate at least 200' over level indoor and outdoor paved surfaces with supervision in order to improve functional mobility with small base quad cane vs. LRAD    Baseline   supervision over indoor suraces, min guard over outdoor paved surfaces when ambulating up inclines    Time  8    Period  Weeks    Status  Partially Met      PT LONG TERM GOAL #3   Title  Pt will perform at least 10 sit <> stands from mat table with  proper technique with no cueing and mod I in order to improve functional transfers and demo improved LE strength.    Baseline  did not perform 10 reps during today's session, however pt demonstrates improved ability with sit <> stand transfers    Time  8    Period  Weeks    Status  Partially Met      PT LONG TERM GOAL #4   Title  Pt will perform all bed mobility from level mat table with mod I in order to decrease caregiver burden.    Baseline  not formally assessed today.    Time  12    Period  Weeks    Status  Deferred      PT LONG TERM GOAL #5   Title  Patient will perform at least 4 steps using single handrail and step to pattern with supervision in order to safely enter/exit the RV.    Baseline  4 steps with single handrail and step to pattern - needs min A on steps to clear RLE due to lip on step, min guard when descending (needed initial reminder cues for proper technique)    Time  8    Period  Weeks    Status  Achieved      PT LONG TERM GOAL #6   Title  Pt will perform a curb with min guard in order to improve community mobility and independence.    Time  8    Period  Weeks    Status  Achieved        Revised/on-going LTGs for re-cert:   PT Long Term Goals - 05/14/19 0905      PT LONG TERM GOAL #1   Title  Pt will improve gait speed with quad cane and R AFO to at least 1.3 ft/sec in order to demo improved gait efficiency. ALL LTGS DUE 06/11/19    Baseline  .97 ft/sec on 3/331/21 with quad cane    Time  4    Period  Weeks    Status  New    Target Date  06/11/19      PT LONG TERM GOAL #2   Title  Pt will ambulate at least 200' over outdoor paved/gravel/mulch surfaces with supervision in order to improve functional  mobility with small base quad cane while camping/going on walks with family.    Baseline  supervision over indoor suraces, min guard over outdoor paved surfaces when ambulating up inclines    Time  4    Period  Weeks    Status  On-going      PT LONG TERM GOAL #3   Title  Pt will perform a curb with supervision in order to improve community mobility and independence.    Baseline  min guard    Time  4    Period  Weeks    Status  On-going      PT LONG TERM GOAL #4   Title  Pt will perform all bed mobility from level mat table with mod I in order to decrease caregiver burden.    Time  4    Period  Weeks    Status  On-going      PT LONG TERM GOAL #5   Title  Patient will perform at least 4 steps using single handrail and step to pattern with supervision in order to safely enter/exit the RV.    Baseline  4 steps with single handrail and step to pattern - needs  min A on steps to clear RLE due to lip on step, min guard when descending (needed initial reminder cues for proper technique)    Time  4    Period  Weeks    Status  On-going           05/14/19 0847  Plan  Clinical Impression Statement Trialed the Thuasne Spry Step posterior R AFO today during gait over indoor and outdoor unlevel surfaces, with pt demonstrating improved gait mechanics and postural alignment with no instances of R toe drag. Pt also reporting he feels better in this brace. Pt and pt's mom to make an appointment with [name] (orthotist) at [facility] for this brace after returning from vacation. Pt is making excellent progress with PT - able to ambulate outdoors today with quad cane with min guard over paved and unlevel grass/mulch/and gravel surfaces. Pt is improving in stair negotiation - needs min A for RLE clearance ascending but has improved hip stability when descending and has less hip ADD with RLE. Able to perform a curb outdoors today with min guard with quad cane. Will re-cert for an additional 2x week for 2 weeks (pt  will be taking a break for 2 weeks due to going on vacation). Revised LTGs as appropriate. Pt will continue to benefit from skilled PT to address dynamic standing balance, gait/transfer/stair training, in order to improve functional mobility and independence and to decr fall risk.  Personal Factors and Comorbidities  (pt ungergoing chemo/radiation)  Examination-Activity Limitations Bed Mobility;Locomotion Level;Sit;Squat;Stairs;Stand;Transfers  Examination-Participation Restrictions Community Activity;School;Driving  Pt will benefit from skilled therapeutic intervention in order to improve on the following deficits Abnormal gait;Decreased activity tolerance;Decreased balance;Decreased cognition;Decreased coordination;Decreased safety awareness;Decreased range of motion;Decreased mobility;Difficulty walking;Decreased strength;Decreased endurance;Impaired tone  Stability/Clinical Decision Making Evolving/Moderate complexity  Rehab Potential Good  PT Frequency 2x / week (taking a 2 week break due to pt and family going on vacation)  PT Duration 2 weeks  PT Treatment/Interventions ADLs/Self Care Home Management;Therapeutic exercise;Therapeutic activities;Functional mobility training;Stair training;Gait training;DME Instruction;Balance training;Neuromuscular re-education;Patient/family education;Orthotic Fit/Training;Wheelchair mobility training;Energy conservation;Passive range of motion  PT Next Visit Plan did they make an appt at [facility] for AFO? continue gait outdoors over unlevel surfaces/obstacles. Needs to work on closed chain glute med activation on R - try lying on R side and pushing into mat today. Half kneeling/tall kneeling.  dynamic standing balance with decr UE support with weight shifitng towards RLE  PT Home Exercise Plan [id number]  Consulted and Agree with Plan of Care Patient;Family member/caregiver  Family Member Consulted dad, Percy       Patient will benefit from skilled  therapeutic intervention in order to improve the following deficits and impairments:  Abnormal gait, Decreased activity tolerance, Decreased balance, Decreased cognition, Decreased coordination, Decreased safety awareness, Decreased range of motion, Decreased mobility, Difficulty walking, Decreased strength, Decreased endurance, Impaired tone  Visit Diagnosis: Unsteadiness on feet  Hemiplegia affecting right dominant side, unspecified etiology, unspecified hemiplegia type ([facility])  Muscle weakness (generalized)  Other symptoms and signs involving the nervous system     Problem List Patient Active Problem List   Diagnosis Date Noted  . Goals of care, counseling/discussion 12/16/2018  . Palliative care by specialist   . Glioblastoma multiforme of temporal lobe ([place]) 11/11/2018  . Spastic hemiparesis ([place])   . Cerebral edema ([facility])   . Intracranial tumor ([place] [facility])   . Steroid-induced hyperglycemia   . Spastic hemiplegia affecting nondominant side ([place])   . Hyponatremia   . Transaminitis   . Leucocytosis   .  Right spastic hemiplegia ([place]) 10/21/2018  . Dysphagia 10/21/2018  . 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 Brain mass   . ICH (intracerebral hemorrhage) ([place]) 10/13/2018    Arliss Journey , PT, DPT  05/14/2019, 9:01 AM  [facility] [address] [place], [place], [address] Phone: [phone]   Fax:  [phone]  Name: Gregory Moreno MRN: [id number] Date of Birth: Jan 08, 1996

## 2019-05-14 NOTE — Therapy (Signed)
[facility] [address] [place], [place], [address] Phone: [phone]   Fax:  [phone]  Speech Language Pathology Treatment  Patient Details  Name: Gregory Moreno MRN: [id number] Date of Birth: [birth year] Referring Provider (SLP): Alger Simons, MD   Encounter Date: 05/13/2019  End of Session - 05/14/19 0000    Visit Number  35    Number of Visits  41    Date for SLP Re-Evaluation  05/25/19    SLP Start Time  74    SLP Stop Time   1616    SLP Time Calculation (min)  42 min    Activity Tolerance  Patient tolerated treatment well       History reviewed. No pertinent past medical history.  Past Surgical History:  Procedure Laterality Date  . CRANIOTOMY Left 10/13/2018   Procedure: LEFT CRANIOTOMY FOR TUMOR RESECTION;  Surgeon: Judith Part, MD;  Location: [facility];  Service: Neurosurgery;  Laterality: Left;    There were no vitals filed for this visit.  Subjective Assessment - 05/13/19 2355    Subjective  "Hi there." Pt went to mother's work yesterday and used [employer] to complete rote tasks. Req'd initial cues but pt completed with minimal (<5) questions for the day.    Patient is accompained by:  Family member    Currently in Pain?  No/denies            ADULT SLP TREATMENT - 05/13/19 2355      General Information   Behavior/Cognition  Cooperative;Requires cueing;Alert;Decreased sustained attention      Treatment Provided   Treatment provided  Cognitive-Linquistic      Pain Assessment   Pain Assessment  No/denies pain      Cognitive-Linquistic Treatment   Treatment focused on  Cognition;Aphasia;Apraxia    Skilled Treatment  SLP worked with pt today in functional word finding task - pt named music groups he likes with 20% success. Otherwise, mod-max cues needed (auditory cues as well as written cues) necessary for incr'd success. Pt smiled numerous times during this task.  Pt was  told to tell his OT today two bands she might like - Pink Floyd and U2.      Assessment / Recommendations / Plan   Plan  Continue with current plan of care      Progression Toward Goals   Progression toward goals  Progressing toward goals       SLP Education - 05/13/19 2358    Education Details  functional tasks like today's task are good for pt to engage in    [employer]) Educated  Patient    Methods  Explanation    Comprehension  Verbalized understanding       SLP Short Term Goals - 04/29/19 1708      SLP SHORT TERM GOAL #1   Title  pt will complete aphasia assessment of auditory comprehension and verbal expression within 1-2 sessions    Status  Achieved      SLP SHORT TERM GOAL #2   Title  pt will demo understanding of simple commands (spoken) with 70% and occasional min cues over three sessions    Time  2    Period  Weeks    Status  On-going      SLP SHORT TERM GOAL #3   Title  pt will answer pertinent questions with at least 3 words    Status  Not Met      SLP SHORT TERM  GOAL #4   Title  pt will demo sustained/selective attention necessary to complete efficacious speech therapy in 6 sessions    Baseline  04-07-19, 04-27-19, 04-29-19    Time  2    Period  Weeks   or 8 sessions   Status  On-going       SLP Long Term Goals - 05/14/19 0001      SLP LONG TERM GOAL #1   Title  pt will demo understanding of simple-mod complex conversation 75% of the time and rare min cues over three sessions    Baseline  02-06-19    Time  1    Period  Weeks   or 17 sessions, for all LTGs   Status  On-going      SLP LONG TERM GOAL #2   Title  pt will answer 5 questions with [facility] verbal responses, in 3 sessions    Baseline  01-27-19, 04-29-19    Status  Achieved      SLP LONG TERM GOAL #3   Title  pt will sustain loud /a/ with average mid 70s dB over 4 sessions    Time  5    Period  Weeks    Status  Deferred   due to focus on language     SLP LONG TERM GOAL #4   Title  pt will  engage in simple conversation for 5 minutes with average loudness mid 60s dB over 3 sessions    Time  5    Period  Weeks    Status  Deferred   due to focus on language     SLP LONG TERM GOAL #5   Title  pt will generate 2 subjects and 3 objects for a given transitive verb with occasional min-mod A    Time  1    Period  Weeks    Status  On-going       Plan - 05/14/19 0000    Clinical Impression Statement  Pt with mod Broca's aphasia, dysarthria/voice, and cognitive communication deficits. Pt also exhibits s/sx verbal apraxia. Pt cont to require consistent SLP cues for verbal communication, pt better with written cues. See "skilled intervention" for more details on today's session. Pt would cont to benefit from skilled ST focusing on primarily receptive and expressive language but also at some point in rehab process focus on speech loudness and cognitive communication skills. Pt cont to require skilled ST focusing primarily on language skills.    Speech Therapy Frequency  2x / week    Duration  --   8 weeks or 41 total visits - see "clinicial impression" for more details   Treatment/Interventions  Environmental controls;Functional tasks;Compensatory techniques;Multimodal communcation approach;SLP instruction and feedback;Cueing hierarchy;Language facilitation;Cognitive reorganization;Internal/external aids;Patient/family education    Potential to Achieve Goals  Good    Potential Considerations  Co-morbidities;Ability to learn/carryover information;Severity of impairments;Other (comment)       Patient will benefit from skilled therapeutic intervention in order to improve the following deficits and impairments:   Aphasia  Verbal apraxia  Dysarthria and anarthria    Problem List Patient Active Problem List   Diagnosis Date Noted  . Goals of care, counseling/discussion 12/16/2018  . Palliative care by specialist   . Glioblastoma multiforme of temporal lobe ([facility]) 11/11/2018  .  Spastic hemiparesis ([facility])   . Cerebral edema (HCC)   . Intracranial tumor ([place])   . Steroid-induced hyperglycemia   . Spastic hemiplegia affecting nondominant side ([facility])   . Hyponatremia   .  Transaminitis   . Leucocytosis   . Right spastic hemiplegia ([place] [facility]) 10/21/2018  . Dysphagia 10/21/2018  . 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 Brain mass   . ICH (intracerebral hemorrhage) ([facility]) 10/13/2018    [facility] ,[place], [place]  05/14/2019, 12:02 AM  [facility] [address] [place], [place], [address] Phone: [phone]   Fax:  [phone]   Name: Gregory Moreno MRN: [id number] Date of Birth: 01/09/1996

## 2019-05-18 ENCOUNTER — Ambulatory Visit: Payer: 59 | Admitting: Occupational Therapy

## 2019-05-25 ENCOUNTER — Encounter: Payer: 59 | Admitting: Occupational Therapy

## 2019-06-01 ENCOUNTER — Ambulatory Visit: Payer: 59

## 2019-06-01 ENCOUNTER — Ambulatory Visit: Payer: 59 | Attending: Physical Medicine & Rehabilitation | Admitting: Physical Therapy

## 2019-06-01 ENCOUNTER — Ambulatory Visit: Payer: 59 | Admitting: Occupational Therapy

## 2019-06-01 ENCOUNTER — Other Ambulatory Visit: Payer: Self-pay

## 2019-06-01 ENCOUNTER — Encounter: Payer: Self-pay | Admitting: Occupational Therapy

## 2019-06-01 DIAGNOSIS — R41842 Visuospatial deficit: Secondary | ICD-10-CM

## 2019-06-01 DIAGNOSIS — R41841 Cognitive communication deficit: Secondary | ICD-10-CM | POA: Diagnosis present

## 2019-06-01 DIAGNOSIS — R482 Apraxia: Secondary | ICD-10-CM

## 2019-06-01 DIAGNOSIS — R29818 Other symptoms and signs involving the nervous system: Secondary | ICD-10-CM | POA: Insufficient documentation

## 2019-06-01 DIAGNOSIS — M6281 Muscle weakness (generalized): Secondary | ICD-10-CM

## 2019-06-01 DIAGNOSIS — R2689 Other abnormalities of gait and mobility: Secondary | ICD-10-CM | POA: Diagnosis present

## 2019-06-01 DIAGNOSIS — R471 Dysarthria and anarthria: Secondary | ICD-10-CM | POA: Diagnosis present

## 2019-06-01 DIAGNOSIS — R4701 Aphasia: Secondary | ICD-10-CM

## 2019-06-01 DIAGNOSIS — G8191 Hemiplegia, unspecified affecting right dominant side: Secondary | ICD-10-CM | POA: Insufficient documentation

## 2019-06-01 DIAGNOSIS — R2681 Unsteadiness on feet: Secondary | ICD-10-CM | POA: Diagnosis present

## 2019-06-01 NOTE — Therapy (Signed)
[facility] [address] [place], [place], [address] Phone: [phone]   Fax:  [phone]  Speech Language Pathology Treatment  Patient Details  Name: Gregory Moreno MRN: [id number] Date of Birth: [birth year] Referring Provider (SLP): Alger Simons, MD   Encounter Date: 06/01/2019  End of Session - 06/01/19 1535    Visit Number  36    Number of Visits  41    Date for SLP Re-Evaluation  06/29/19    SLP Start Time  5809    SLP Stop Time   1400    SLP Time Calculation (min)  42 min    Activity Tolerance  Patient tolerated treatment well       History reviewed. No pertinent past medical history.  Past Surgical History:  Procedure Laterality Date  . CRANIOTOMY Left 10/13/2018   Procedure: LEFT CRANIOTOMY FOR TUMOR RESECTION;  Surgeon: Judith Part, MD;  Location: [facility];  Service: Neurosurgery;  Laterality: Left;    There were no vitals filed for this visit.  Subjective Assessment - 06/01/19 1320    Subjective  "We got back -  yesterday." (pt, re: vacation)    Patient is accompained by:  Family member    Currently in Pain?  No/denies            ADULT SLP TREATMENT - 06/01/19 1432      General Information   Behavior/Cognition  Alert;Cooperative;Pleasant mood;Requires cueing      Treatment Provided   Treatment provided  Cognitive-Linquistic      Cognitive-Linquistic Treatment   Treatment focused on  Aphasia;Apraxia;Cognition    Skilled Treatment  To work on pt's verbal expression with viaual cues (pictures) SLP asked pt about his vacation as he showed SLP pictures. SLP minimlly assisted pt rarely about explanation from pictures.       Assessment / Recommendations / Plan   Plan  Continue with current plan of care      Progression Toward Goals   Progression toward goals  Progressing toward goals         SLP Short Term Goals - 04/29/19 1708      SLP SHORT TERM GOAL #1   Title  pt will  complete aphasia assessment of auditory comprehension and verbal expression within 1-2 sessions    Status  Achieved      SLP SHORT TERM GOAL #2   Title  pt will demo understanding of simple commands (spoken) with 70% and occasional min cues over three sessions    Time  2    Period  Weeks    Status  On-going      SLP SHORT TERM GOAL #3   Title  pt will answer pertinent questions with at least 3 words    Status  Not Met      SLP SHORT TERM GOAL #4   Title  pt will demo sustained/selective attention necessary to complete efficacious speech therapy in 6 sessions    Baseline  04-07-19, 04-27-19, 04-29-19    Time  2    Period  Weeks   or 8 sessions   Status  On-going       SLP Long Term Goals - 06/01/19 1541      SLP LONG TERM GOAL #1   Title  pt will demo understanding of simple-mod complex conversation 75% of the time and rare min cues over three sessions    Baseline  02-06-19, 06-01-19    Time  3  Period  Weeks   or 17 sessions, for all LTGs   Status  On-going      SLP LONG TERM GOAL #2   Title  pt will answer 5 questions with [facility] verbal responses, in 3 sessions    Baseline  01-27-19, 04-29-19    Status  Achieved      SLP LONG TERM GOAL #3   Title  pt will sustain loud /a/ with average mid 70s dB over 4 sessions    Time  5    Period  Weeks    Status  Deferred   due to focus on language     SLP LONG TERM GOAL #4   Title  pt will engage in simple conversation for 5 minutes with average loudness mid 60s dB over 3 sessions    Time  5    Period  Weeks    Status  Deferred   due to focus on language     SLP LONG TERM GOAL #5   Title  pt will generate 2 subjects and 3 objects for a given transitive verb with occasional min-mod A    Time  3    Period  Weeks    Status  On-going       Plan - 06/01/19 1536    Clinical Impression Statement  Pt with mod Broca's aphasia, dysarthria/voice, and cognitive communication deficits. Pt also exhibits s/sx verbal apraxia. [name] verbal  expression appears to have improved since previous session with visual support.. See "skilled intervention" for more details on today's session. Pt would cont to benefit from skilled ST focusing on primarily receptive and expressive language but also at some point in rehab process focus on speech loudness and cognitive communication skills. Pt cont to require skilled ST focusing primarily on language skills.    Speech Therapy Frequency  2x / week    Duration  --   3 weeks or 41 total visits - see "clinical impression" for more details   Treatment/Interventions  Environmental controls;Functional tasks;Compensatory techniques;Multimodal communcation approach;SLP instruction and feedback;Cueing hierarchy;Language facilitation;Cognitive reorganization;Internal/external aids;Patient/family education    Potential to Achieve Goals  Good    Potential Considerations  Co-morbidities;Ability to learn/carryover information;Severity of impairments;Other (comment)       Patient will benefit from skilled therapeutic intervention in order to improve the following deficits and impairments:   Aphasia  Verbal apraxia  Dysarthria and anarthria    Problem List Patient Active Problem List   Diagnosis Date Noted  . Goals of care, counseling/discussion 12/16/2018  . Palliative care by specialist   . Glioblastoma multiforme of temporal lobe ([facility]) 11/11/2018  . Spastic hemiparesis ([facility] [place] [facility])   . Cerebral edema (HCC)   . Intracranial tumor ([facility])   . Steroid-induced hyperglycemia   . Spastic hemiplegia affecting nondominant side ([facility])   . Hyponatremia   . Transaminitis   . Leucocytosis   . Right spastic hemiplegia ([facility]) 10/21/2018  . Dysphagia 10/21/2018  . 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 Brain mass   . ICH (intracerebral hemorrhage) ([facility]) 10/13/2018    [facility] ,[place], [place]  06/01/2019, 3:43 PM  [facility] [address] [place], [place], [address] Phone: [phone]   Fax:  [phone]   Name: Gregory Moreno MRN: [id number] Date of Birth: 10-19-1995

## 2019-06-01 NOTE — Therapy (Signed)
[facility] [address] [place], [place], [address] Phone: [phone]   Fax:  [phone]  Occupational Therapy Treatment  Patient Details  Name: Gregory Moreno MRN: [id number] Date of Birth: 06-09-1995 Referring Provider (OT): Marlowe [place]   Encounter Date: 06/01/2019  OT End of Session - 06/01/19 1850    Visit Number  40    Number of Visits  46    Date for OT Re-Evaluation  07/13/19    Authorization Type  No VL - No auth required    OT Start Time  1501    OT Stop Time  1545    OT Time Calculation (min)  44 min    Activity Tolerance  Patient tolerated treatment well       History reviewed. No pertinent past medical history.  Past Surgical History:  Procedure Laterality Date  . CRANIOTOMY Left 10/13/2018   Procedure: LEFT CRANIOTOMY FOR TUMOR RESECTION;  Surgeon: Judith Part, MD;  Location: [facility];  Service: Neurosurgery;  Laterality: Left;    There were no vitals filed for this visit.  Subjective Assessment - 06/01/19 1846    Subjective   I had a good vacation but I am glad to be at the pool today    Patient is accompanied by:  Family member   dad   Pertinent History  Glioblastoma         Patient seen for aquatic therapy today.  Treatment took place in water 2.5-4 feet deep depending upon activity.  Pt entered the pool via ramp using 1 hand railing and min facilitation to address postural alignment and control.  Utilized crouched walking with hand held assist to facilitate more normalized step length as well as to increase weight bearing and activation of RLE and trunk. Utilized aqua stretch techniques to address improving ROM for R hip extension followed by active hip extension with knee flexed for shorter lever arm to reduce resistance. Pt required mod assist to maintain core alignment and to isolate hip extension but today could initiate hip extension consistently.  Transitioned into supine using  floatation devices and utilized [place] techniques to reduce spasticity as well as to mobilize trunk and R shoulder girdle.  Also addressed in supine aqua stretch for RLE into abduction followed by active isolated abduction of RLE for strengthening. Addressed hip extension in supine with knee extension and pt able to initiate and assist with movement. Also addressed knee flexion/extension - due to apraxia pt required increased assistance but feel this will improve with repetition as hip extension and abduction did.  Transitioned back into standing and addressed functional ambulation with emphasis on postural alignment and R hip and trunk control - pt able to ambulate full length of pool x2 with mod facilitation at hip with good knee control.  Pt exited the pool via ramp using 1 hand railing and mod facilitation for alignment and control to prevent hyper extension of R knee and to reduce spasticity in RLE and RUE. Pt is improving.                       OT Short Term Goals - 05/11/19 1757      OT SHORT TERM GOAL #1   Title  Patient will complete an HEP designed to improve active motion in RUE due 01/16/19    Status  Achieved      OT SHORT TERM GOAL #2   Title  Patient will reach forward  with shoulder flexion, elbow extension pattern to make contact with target in midline and 8-12' from chest with intermittent assist    Status  Achieved      OT SHORT TERM GOAL #3   Title  Patient will transfer from wheelchair to commode with no more than supervision assistance - transferring either toward right or toward left side    Status  Achieved      OT SHORT TERM GOAL #4   Title  Patient will grasp and release a cylindrical object 2-3" in diameter with min assist    Status  Achieved      OT SHORT TERM GOAL #5   Title  Patient will dress lower body with mod assist    Status  Achieved      OT SHORT TERM GOAL #6   Title  Patient will don/doff underwear, pants, and left shoe with min  assist    Status  Achieved      OT SHORT TERM GOAL #7   Title  Patient will complete shower transfer with supervision    Status  Achieved      OT SHORT TERM GOAL #8   Title  Patient will bathe self in shower with supervision and cueing assistance    Status  Achieved        OT Long Term Goals - 06/01/19 1847      OT LONG TERM GOAL #1   Title  Patient will dress himself with no greater than set up assistance - 05/15/19    Status  Achieved      OT LONG TERM GOAL #2   Title  Patient will shower with supervision    Status  Achieved      OT LONG TERM GOAL #3   Title  Patient will cut food on plate with modified independence    Status  Achieved      OT LONG TERM GOAL #4   Title  Patient will demonstrate ability to retrieve a lightweight item (less than 2lb) from waist height or lower and transport 6-12 inches laterally with RUE    Status  Deferred      OT LONG TERM GOAL #5   Title  Patient will demonstrate RUE  mid level reach with minimal LUE support to hit a target at chest height directly in front of body    Status  Deferred      Long Term Additional Goals   Additional Long Term Goals  Yes      OT LONG TERM GOAL #6   Title  Patient will demonstrate improved postural alignment and control with functional mobility to reduce fall risk and spasticity in RUE and RLE. - 07/13/2019    Status  Revised      OT LONG TERM GOAL #7   Title  Pt and family will demonstrate understanding of aquatic based HEP to address balance, spasticity mgmt, LE strengthening    Status  New            Plan - 06/01/19 1848    Clinical Impression Statement  Pt demonstrating improved postural alignment and control with standing and functional ambulation resulting in decreased spasticity in RUE.    OT Occupational Profile and History  Comprehensive Assessment- Review of records and extensive additional review of physical, cognitive, psychosocial history related to current functional performance     Occupational performance deficits (Please refer to evaluation for details):  ADL's;IADL's;Rest and Sleep;Education;Work;Play;Leisure;Social Participation    Body Structure / Function /  Physical Skills  ADL;Decreased knowledge of use of DME;Gait;Strength;Balance;Dexterity;GMC;Tone;Body mechanics;Hearing;Proprioception;UE functional use;Endurance;IADL;ROM;Vestibular;Vision;Coordination;Flexibility;Mobility;Sensation;FMC;Decreased knowledge of precautions    Cognitive Skills  Attention;Problem Solve;Emotional;Safety Awareness;Sequencing;Memory;Learn;Thought;Understand;Perception    Psychosocial Skills  Routines and Behaviors;Interpersonal Interaction    Rehab Potential  Good    Clinical Decision Making  Multiple treatment options, significant modification of task necessary    Comorbidities Affecting Occupational Performance:  May have comorbidities impacting occupational performance    Modification or Assistance to Complete Evaluation   Min-Moderate modification of tasks or assist with assess necessary to complete eval    OT Frequency  1x / week    OT Duration  8 weeks    OT Treatment/Interventions  Self-care/ADL training;DME and/or AE instruction;Splinting;Balance training;Aquatic Therapy;Therapeutic activities;Therapeutic exercise;Cognitive remediation/compensation;Neuromuscular education;Functional Mobility Training;Visual/perceptual remediation/compensation;Electrical Stimulation;Manual Therapy;Patient/family education    Plan  aquatic therapy to address postural alignment and control, incorporation and activation of R side to decrease spasitcity and reduce fall risk    Consulted and Agree with Plan of Care  Patient;Family member/caregiver    Family Member Consulted  dad       Patient will benefit from skilled therapeutic intervention in order to improve the following deficits and impairments:   Body Structure / Function / Physical Skills: ADL, Decreased knowledge of use of DME, Gait, Strength,  Balance, Dexterity, GMC, Tone, Body mechanics, Hearing, Proprioception, UE functional use, Endurance, IADL, ROM, Vestibular, Vision, Coordination, Flexibility, Mobility, Sensation, FMC, Decreased knowledge of precautions Cognitive Skills: Attention, Problem Solve, Emotional, Safety Awareness, Sequencing, Memory, Learn, Thought, Understand, Perception Psychosocial Skills: Routines and Behaviors, Interpersonal Interaction   Visit Diagnosis: Hemiplegia affecting right dominant side, unspecified etiology, unspecified hemiplegia type (HCC)  Muscle weakness (generalized)  Other symptoms and signs involving the nervous system  Unsteadiness on feet  Apraxia  Visuospatial deficit    Problem List Patient Active Problem List   Diagnosis Date Noted  . Goals of care, counseling/discussion 12/16/2018  . Palliative care by specialist   . Glioblastoma multiforme of temporal lobe ([facility]) 11/11/2018  . Spastic hemiparesis ([place])   . Cerebral edema (HCC)   . Intracranial tumor ([place])   . Steroid-induced hyperglycemia   . Spastic hemiplegia affecting nondominant side ([place])   . Hyponatremia   . Transaminitis   . Leucocytosis   . Right spastic hemiplegia ([place] [place]) 10/21/2018  . Dysphagia 10/21/2018  . 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 Brain mass   . ICH (intracerebral hemorrhage) ([place]) 10/13/2018    Quay Burow, OTR/L 06/01/2019, 7:01 PM  [facility] [address] [place], [place], [address] Phone: [phone]   Fax:  [phone]  Name: Gregory Moreno MRN: [id number] Date of Birth: Sep 12, 1995

## 2019-06-01 NOTE — Therapy (Signed)
[facility] [address] [place], [place], [address] Phone: [phone]   Fax:  [phone]  Physical Therapy Treatment  Patient Details  Name: Gregory Moreno MRN: [id number] Date of Birth: 1995-02-18 Referring Provider (PT): Alger Simons, MD   Encounter Date: 06/01/2019  PT End of Session - 06/01/19 2156    Visit Number  38    Number of Visits  41    Date for PT Re-Evaluation  06/13/19    Authorization Type  [employer] - 2021, no VL, $40 copay per therapy per day    PT Start Time  1232    PT Stop Time  1315    PT Time Calculation (min)  43 min    Equipment Utilized During Treatment  Gait belt    Activity Tolerance  Patient tolerated treatment well    Behavior During Therapy  [facility] for tasks assessed/performed       No past medical history on file.  Past Surgical History:  Procedure Laterality Date  . CRANIOTOMY Left 10/13/2018   Procedure: LEFT CRANIOTOMY FOR TUMOR RESECTION;  Surgeon: Judith Part, MD;  Location: [facility];  Service: Neurosurgery;  Laterality: Left;    There were no vitals filed for this visit.  Subjective Assessment - 06/01/19 1240    Subjective  Recently got back from vacation - had a blast. Did some walking without his brace and in some boots and also did some walking without his cane.    Limitations  Sitting;Walking;Standing    How long can you walk comfortably?  100' with hemiwalker    Patient Stated Goals  wants to improve his walking, get back to normal    Currently in Pain?  No/denies                        [facility] Adult PT Treatment/Exercise - 06/01/19 0001      Ambulation/Gait   Ambulation/Gait  Yes    Ambulation/Gait Assistance  4: Min assist;4: Min guard    Ambulation/Gait Assistance Details  ambulated throughout session today with Thuasne Spry Step AFO with pt continuing to demonstrate improved postural alignment and R foot clearance. ambulated with no AD with  therapist helping provide manual facilitation for R hip ABD in stance phase. min verbal cues for R knee control during stance. no overt LOB with no AD    Ambulation Distance (Feet)  115 Feet   x1, plus an additional 30'   Assistive device  None    Gait Pattern  Step-through pattern;Decreased hip/knee flexion - right;Decreased dorsiflexion - right;Right genu recurvatum;Poor foot clearance - right    Ambulation Surface  Level;Indoor    Gait Comments  pt ambulated into clinic with small base quad cane      High Level Balance   High Level Balance Comments  in // bars: side stepping down and back with single UE support, cues for weight shifting and min cues for alignment of R foot for hip ABD strengthening, retro gait in bars x3 reps with single UE support and use of mirror for visual feedback, needing assist from therapist for R hip ext, verbal and tactile cues for R knee control       Therapeutic Activites    Therapeutic Activities  Other Therapeutic Activities    Other Therapeutic Activities  discussed with pt and pt's dad about making an upcoming appointment at [facility] for new AFO (Thuasne SpryStep) due to pt demonstrating improved postural alignment  and gait mechanics. [name] dad called and made an appointment during session - with pt going to [place] tomorow morning to see the orthotist       Knee/Hip Exercises: Standing   Lateral Step Up  Right;1 set;10 reps;Hand Hold: 1;Step Height: 4";Step Height: 2"    Lateral Step Up Limitations  RLE lateral step up to 2" step for R closed chain hip ABD strengthening, performed 10 reps on 2" step, then an additional 10 reps on 4" step, initial verbal and tactile cues for R knee control             PT Education - 06/01/19 2156    Education Details  making appt with [facility] for new AFO    Person(s) Educated  Patient;Parent(s)    Methods  Explanation    Comprehension  Verbalized understanding       PT Short Term Goals - 05/14/19 0847      PT SHORT  TERM GOAL #1   Title  ALL STGS = LTGS        PT Long Term Goals - 05/14/19 0905      PT LONG TERM GOAL #1   Title  Pt will improve gait speed with quad cane and R AFO to at least 1.3 ft/sec in order to demo improved gait efficiency. ALL LTGS DUE 06/11/19    Baseline  .97 ft/sec on 3/331/21 with quad cane    Time  4    Period  Weeks    Status  New    Target Date  06/11/19      PT LONG TERM GOAL #2   Title  Pt will ambulate at least 200' over outdoor paved/gravel/mulch surfaces with supervision in order to improve functional mobility with small base quad cane while camping/going on walks with family.    Baseline  supervision over indoor suraces, min guard over outdoor paved surfaces when ambulating up inclines    Time  4    Period  Weeks    Status  On-going      PT LONG TERM GOAL #3   Title  Pt will perform a curb with supervision in order to improve community mobility and independence.    Baseline  min guard    Time  4    Period  Weeks    Status  On-going      PT LONG TERM GOAL #4   Title  Pt will perform all bed mobility from level mat table with mod I in order to decrease caregiver burden.    Time  4    Period  Weeks    Status  On-going      PT LONG TERM GOAL #5   Title  Patient will perform at least 4 steps using single handrail and step to pattern with supervision in order to safely enter/exit the RV.    Baseline  4 steps with single handrail and step to pattern - needs min A on steps to clear RLE due to lip on step, min guard when descending (needed initial reminder cues for proper technique)    Time  4    Period  Weeks    Status  On-going            Plan - 06/01/19 2157    Clinical Impression Statement  Pt is making excellent progress with PT - able to perform gait today with no AD with min guard with use of R Spry Step AFO with improved postural alignment and R  foot clearance. Pt does continue to need assist at times for R closed chain hip ABD during stance and  for R knee control. Pt with difficulty with R hip extension during retro gait in // bars with single UE support. Pt will be going to [facility] tomorrow for pt's new AFO. Will continue to progress towards LTGs.    Personal Factors and Comorbidities  --   pt ungergoing chemo/radiation   Examination-Activity Limitations  Bed Mobility;Locomotion Level;Sit;Squat;Stairs;Stand;Transfers    Examination-Participation Restrictions  Community Activity;School;Driving    Stability/Clinical Decision Making  Evolving/Moderate complexity    Rehab Potential  Good    PT Frequency  2x / week   taking a 2 week break due to pt and family going on vacation   PT Duration  2 weeks    PT Treatment/Interventions  ADLs/Self Care Home Management;Therapeutic exercise;Therapeutic activities;Functional mobility training;Stair training;Gait training;DME Instruction;Balance training;Neuromuscular re-education;Patient/family education;Orthotic Fit/Training;Wheelchair mobility training;Energy conservation;Passive range of motion    PT Next Visit Plan  how did appt with [facility] go? discuss with pt about extending PT through end of June alongside OT in pool.  continue gait outdoors over unlevel surfaces/obstacles. gait with no AD. Needs to work on closed chain glute med activation on R.  Half kneeling/tall kneeling.    PT Home Exercise Plan  [id number]    Consulted and Agree with Plan of Care  Patient;Family member/caregiver    Family Member Consulted  dad, Mizraim       Patient will benefit from skilled therapeutic intervention in order to improve the following deficits and impairments:  Abnormal gait, Decreased activity tolerance, Decreased balance, Decreased cognition, Decreased coordination, Decreased safety awareness, Decreased range of motion, Decreased mobility, Difficulty walking, Decreased strength, Decreased endurance, Impaired tone  Visit Diagnosis: Hemiplegia affecting right dominant side, unspecified etiology, unspecified  hemiplegia type (HCC)  Muscle weakness (generalized)  Other symptoms and signs involving the nervous system  Unsteadiness on feet     Problem List Patient Active Problem List   Diagnosis Date Noted  . Goals of care, counseling/discussion 12/16/2018  . Palliative care by specialist   . 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 Spastic hemiparesis ([place])   . Cerebral edema (HCC)   . Intracranial tumor ([place])   . Steroid-induced hyperglycemia   . Spastic hemiplegia affecting nondominant side ([place])   . Hyponatremia   . Transaminitis   . Leucocytosis   . Right spastic hemiplegia ([place]) 10/21/2018  . Dysphagia 10/21/2018  . 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 Brain mass   . ICH (intracerebral hemorrhage) ([place]) 10/13/2018    Arliss Journey, PT, DPT  06/01/2019, 10:05 PM  [facility] [address] [place], [place], [address] Phone: [phone]   Fax:  [phone]  Name: Gregory Moreno MRN: [id number] Date of Birth: 06-Jun-1995

## 2019-06-02 ENCOUNTER — Inpatient Hospital Stay: Payer: 59 | Attending: Internal Medicine | Admitting: Internal Medicine

## 2019-06-02 ENCOUNTER — Other Ambulatory Visit: Payer: Self-pay

## 2019-06-02 ENCOUNTER — Inpatient Hospital Stay: Payer: 59

## 2019-06-02 ENCOUNTER — Other Ambulatory Visit: Payer: Self-pay | Admitting: Internal Medicine

## 2019-06-02 VITALS — BP 127/71 | HR 85 | Temp 98.2°F | Resp 18 | Ht 69.0 in | Wt 187.3 lb

## 2019-06-02 DIAGNOSIS — C712 Malignant neoplasm of temporal lobe: Secondary | ICD-10-CM

## 2019-06-02 DIAGNOSIS — Z79899 Other long term (current) drug therapy: Secondary | ICD-10-CM | POA: Diagnosis not present

## 2019-06-02 LAB — CBC WITH DIFFERENTIAL (CANCER CENTER ONLY)
Abs Immature Granulocytes: 0.02 10*3/uL (ref 0.00–0.07)
Basophils Absolute: 0 10*3/uL (ref 0.0–0.1)
Basophils Relative: 0 %
Eosinophils Absolute: 0.1 10*3/uL (ref 0.0–0.5)
Eosinophils Relative: 2 %
HCT: 36.9 % — ABNORMAL LOW (ref 39.0–52.0)
Hemoglobin: 12.2 g/dL — ABNORMAL LOW (ref 13.0–17.0)
Immature Granulocytes: 0 %
Lymphocytes Relative: 14 %
Lymphs Abs: 0.8 10*3/uL (ref 0.7–4.0)
MCH: 30.4 pg (ref 26.0–34.0)
MCHC: 33.1 g/dL (ref 30.0–36.0)
MCV: 92 fL (ref 80.0–100.0)
Monocytes Absolute: 0.5 10*3/uL (ref 0.1–1.0)
Monocytes Relative: 9 %
Neutro Abs: 4 10*3/uL (ref 1.7–7.7)
Neutrophils Relative %: 75 %
Platelet Count: 118 10*3/uL — ABNORMAL LOW (ref 150–400)
RBC: 4.01 MIL/uL — ABNORMAL LOW (ref 4.22–5.81)
RDW: 16 % — ABNORMAL HIGH (ref 11.5–15.5)
WBC Count: 5.4 10*3/uL (ref 4.0–10.5)
nRBC: 0 % (ref 0.0–0.2)

## 2019-06-02 LAB — CMP (CANCER CENTER ONLY)
ALT: 9 U/L (ref 0–44)
AST: 14 U/L — ABNORMAL LOW (ref 15–41)
Albumin: 3.6 g/dL (ref 3.5–5.0)
Alkaline Phosphatase: 78 U/L (ref 38–126)
Anion gap: 10 (ref 5–15)
BUN: 12 mg/dL (ref 6–20)
CO2: 26 mmol/L (ref 22–32)
Calcium: 9.5 mg/dL (ref 8.9–10.3)
Chloride: 106 mmol/L (ref 98–111)
Creatinine: 0.88 mg/dL (ref 0.61–1.24)
GFR, Est AFR Am: >60 mL/min (ref >60)
GFR, Estimated: >60 mL/min (ref >60)
Glucose, Bld: 84 mg/dL (ref 70–99)
Potassium: 4 mmol/L (ref 3.5–5.1)
Sodium: 142 mmol/L (ref 135–145)
Total Bilirubin: 0.4 mg/dL (ref 0.3–1.2)
Total Protein: 7.7 g/dL (ref 6.5–8.1)

## 2019-06-02 MED ORDER — TEMOZOLOMIDE 100 MG PO CAPS: 150.0000 mg/m2/d | ORAL_CAPSULE | Freq: Every day | ORAL | 0 refills | Status: DC

## 2019-06-02 NOTE — Progress Notes (Signed)
[facility] [place], [place] [address] [phone]   Interval Evaluation  Date of Service: 06/02/19 Patient Name: Gregory Moreno Patient MRN: [id number] Patient DOB: 08-28-1995 Provider: Ventura Sellers, MD  Identifying Statement:  Gregory Moreno is a 24 y.o. male with left temporal glioblastoma   Referring Provider: [facility] [address] 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    02/09/2019 -  Chemotherapy   The patient had [No matching medication found in this treatment plan]  for chemotherapy treatment.      Biomarkers:  MGMT Unknown.  IDH 1/2 Wild type.  EGFR Unknown  TERT Unknown   Interval History:  Gregory Moreno presents today for follow up following after completing cycle #3 adjuvant Temodar.  Right side strength continues to improve, he is ambulating consistently with a cane, and some without any assistance clinic today.  Remains with dense left visual field impairment.  Language continues to modestly improve.  No issues with tolerating chemo.  Medications: Current Outpatient Medications on File Prior to Visit  Medication Sig Dispense Refill  . escitalopram (LEXAPRO) 5 MG tablet TAKE 1 TABLET BY MOUTH EVERYDAY AT BEDTIME 90 tablet 1  . ondansetron (ZOFRAN) 4 MG tablet Take 1 tablet (4 mg total) by mouth every 8 (eight) hours as needed for nausea or vomiting. 30 tablet 1  . ondansetron (ZOFRAN) 8 MG tablet TAKE 1 TABLET BY MOUTH TWICE DAILY AS NEEDED FOR NAUSEA AND VOMITING. MAY TAKE 30-60 MINUTES PRIOR TO TEMODAR ADMINISTRATION IF NAUSEA/VOMITING OCCURS. (Patient not taking:  Reported on 05/14/2019) 30 tablet 0  . temozolomide (TEMODAR) 100 MG capsule Take 4 capsules (400 mg total) by mouth daily. May take on an empty stomach to decrease nausea & vomiting. (Patient not taking: Reported on 05/14/2019) 20 capsule 0  . temozolomide (TEMODAR) 100 MG capsule Take 3 capsules (300 mg total) by mouth daily. May take on an empty stomach to decrease nausea & vomiting. (Patient not taking: Reported on 05/14/2019) 15 capsule 0  . [DISCONTINUED] escitalopram (LEXAPRO) 5 MG tablet TAKE 1 TABLET BY MOUTH EVERYDAY AT BEDTIME 30 tablet 0   No current facility-administered medications on file prior to visit.    Allergies: No Known Allergies Past Medical History: No past medical history on file. Past Surgical History:  Past Surgical History:  Procedure Laterality Date  . CRANIOTOMY Left 10/13/2018   Procedure: LEFT CRANIOTOMY FOR TUMOR RESECTION;  Surgeon: Judith Part, MD;  Location: [facility];  Service: Neurosurgery;  Laterality: Left;   Social History:  Social History   Socioeconomic History  . Marital status: Single    Spouse name: Not on file  . Number of children: Not on file  . Years of education: Not on file  . Highest education level: Not on file  Occupational History  . Not on file  Tobacco Use  . Smoking status: Never Smoker  . Smokeless tobacco: Never Used  Substance and Sexual Activity  . Alcohol use: Not on file  . Drug use: Not on file  . Sexual activity: Not on file  Other Topics Concern  . Not on file  Social History Narrative  .  Not on file   Social Determinants of Health   Financial Resource Strain:   . Difficulty of Paying Living Expenses:   Food Insecurity:   . Worried About Charity fundraiser in the Last Year:   . Arboriculturist in the Last Year:   Transportation Needs:   . Film/video editor (Medical):   Marland Kitchen Lack of Transportation (Non-Medical):   Physical Activity:   . Days of Exercise per Week:   . Minutes of Exercise per  Session:   Stress:   . Feeling of Stress :   Social Connections:   . Frequency of Communication with Friends and Family:   . Frequency of Social Gatherings with Friends and Family:   . Attends Religious Services:   . Active Member of Clubs or Organizations:   . Attends Archivist Meetings:   Marland Kitchen Marital Status:   Intimate Partner Violence:   . Fear of Current or Ex-Partner:   . Emotionally Abused:   Marland Kitchen Physically Abused:   . Sexually Abused:    Family History: No family history on file.  Review of Systems: Constitutional: Denies fevers, chills or abnormal weight loss Eyes: Denies blurriness of vision Ears, nose, mouth, throat, and face: Denies mucositis or sore throat Respiratory: Denies cough, dyspnea or wheezes Cardiovascular: Denies palpitation, chest discomfort or lower extremity swelling Gastrointestinal:  Denies nausea, constipation, diarrhea GU: Denies dysuria or incontinence Skin: Denies abnormal skin rashes Neurological: Per HPI Musculoskeletal: Denies joint pain, back or neck discomfort. No decrease in ROM Behavioral/Psych: Denies anxiety, disturbance in thought content, and mood instability  Physical Exam: Vitals:   06/02/19 1100  BP: 127/71  Pulse: 85  Resp: 18  Temp: 98.2 F (36.8 C)  SpO2: 99%   KPS: 60. General: Alert, cooperative, pleasant, in no acute distress Head: Craniotomy scar noted, dry and intact. EENT: No conjunctival injection or scleral icterus. Oral mucosa moist Lungs: Resp effort normal Cardiac: Regular rate and rhythm Abdomen: Soft, non-distended abdomen Skin: No rashes cyanosis or petechiae. Extremities: No clubbing or edema  Neurologic Exam: Mental Status: Awake, alert, attentive to examiner. Oriented to self and environment. Language is impaired with regards to fluency.  Comprehension impaired to multi-step complex commands.  Simple repetition is preserved. Cranial Nerves: Visual acuity is grossly normal. Dense R homonymous  hemianopia. Extra-ocular movements intact. No ptosis. Face is symmetric, tongue midline. Motor: Tone and bulk are normal. Power is 3/5 in right arm and leg, 5/5 on left side. Reflexes are symmetric, no pathologic reflexes present. Sensory: Intact to light touch and temperature Gait: Dystaxic, hemiparetic  Labs: I have reviewed the data as listed    Component Value Date/Time   NA 141 05/14/2019 1136   K 3.9 05/14/2019 1136   CL 109 05/14/2019 1136   CO2 26 05/14/2019 1136   GLUCOSE 82 05/14/2019 1136   BUN 8 05/14/2019 1136   CREATININE 0.87 05/14/2019 1136   CALCIUM 9.8 05/14/2019 1136   PROT 8.1 05/14/2019 1136   ALBUMIN 3.9 05/14/2019 1136   AST 17 05/14/2019 1136   ALT 11 05/14/2019 1136   ALKPHOS 68 05/14/2019 1136   BILITOT 0.3 05/14/2019 1136   GFRNONAA >60 05/14/2019 1136   GFRAA >60 05/14/2019 1136   Lab Results  Component Value Date   WBC 5.4 06/02/2019   NEUTROABS 4.0 06/02/2019   HGB 12.2 (L) 06/02/2019   HCT 36.9 (L) 06/02/2019   MCV 92.0 06/02/2019   PLT 118 (L) 06/02/2019    Assessment/Plan  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   We recommended continuing treatment with Temozolomide 160m/m2, on for five days and off for twenty three days in twenty eight day cycles. The patient will have a complete blood count performed on days 21 and 28 of each cycle, and a comprehensive metabolic panel performed on day 28 of each cycle. Labs may need to be performed more often. Zofran will prescribed for home use for nausea/vomiting.   Chemotherapy should be held for the following:  ANC less than 1,000  Platelets less than 100,000  LFT or creatinine greater than 2x ULN  If clinical concerns/contraindications develop  Clinically, we continue to be very pleased with his functional improvement through aggressive neuro-directed PT and OT.  We will follow up with him in 4 weeks prior to  cycle #4 with labs for review.  All questions were answered. The patient knows to call the clinic with any problems, questions or concerns. No barriers to learning were detected.  The total time spent in the encounter was 40 minutes and more than 50% was on counseling and review of test results   ZVentura Sellers MD Medical Director of Neuro-Oncology [facility] Centerat [place] [place]05/18/21 11:49 AM

## 2019-06-03 ENCOUNTER — Encounter: Payer: Self-pay | Admitting: Physical Therapy

## 2019-06-03 ENCOUNTER — Ambulatory Visit: Payer: 59 | Admitting: Physical Therapy

## 2019-06-03 ENCOUNTER — Ambulatory Visit: Payer: 59

## 2019-06-03 ENCOUNTER — Telehealth: Payer: Self-pay | Admitting: Internal Medicine

## 2019-06-03 DIAGNOSIS — R41841 Cognitive communication deficit: Secondary | ICD-10-CM

## 2019-06-03 DIAGNOSIS — R471 Dysarthria and anarthria: Secondary | ICD-10-CM

## 2019-06-03 DIAGNOSIS — G8191 Hemiplegia, unspecified affecting right dominant side: Secondary | ICD-10-CM

## 2019-06-03 DIAGNOSIS — R2689 Other abnormalities of gait and mobility: Secondary | ICD-10-CM

## 2019-06-03 DIAGNOSIS — R2681 Unsteadiness on feet: Secondary | ICD-10-CM

## 2019-06-03 DIAGNOSIS — M6281 Muscle weakness (generalized): Secondary | ICD-10-CM

## 2019-06-03 DIAGNOSIS — R482 Apraxia: Secondary | ICD-10-CM

## 2019-06-03 DIAGNOSIS — R29818 Other symptoms and signs involving the nervous system: Secondary | ICD-10-CM

## 2019-06-03 DIAGNOSIS — R4701 Aphasia: Secondary | ICD-10-CM

## 2019-06-03 NOTE — Therapy (Signed)
[facility] [address] [place], [place], [address] Phone: [phone]   Fax:  [phone]  Physical Therapy Treatment  Patient Details  Name: Gregory Moreno MRN: [id number] Date of Birth: [birth year] Referring Provider (PT): Alger Simons, MD   Encounter Date: 06/03/2019  PT End of Session - 06/03/19 1605    Visit Number  39    Number of Visits  41    Date for PT Re-Evaluation  06/13/19    Authorization Type  [employer] - 2021, no VL, $40 copay per therapy per day    PT Start Time  1454    PT Stop Time  1538    PT Time Calculation (min)  44 min    Equipment Utilized During Treatment  Gait belt    Activity Tolerance  Patient tolerated treatment well    Behavior During Therapy  [facility] for tasks assessed/performed       History reviewed. No pertinent past medical history.  Past Surgical History:  Procedure Laterality Date  . CRANIOTOMY Left 10/13/2018   Procedure: LEFT CRANIOTOMY FOR TUMOR RESECTION;  Surgeon: Judith Part, MD;  Location: [facility];  Service: Neurosurgery;  Laterality: Left;    There were no vitals filed for this visit.  Subjective Assessment - 06/03/19 1455    Subjective  Got his new AFO, feels good.  Walked last session without AD, "that was new to me."    Limitations  Sitting;Walking;Standing    How long can you walk comfortably?  100' with hemiwalker    Patient Stated Goals  wants to improve his walking, get back to normal    Currently in Pain?  No/denies                        [facility] Adult PT Treatment/Exercise - 06/03/19 1526      Transfers   Transfers  Sit to Stand;Stand to [employer] Transfers    Sit to Stand  6: Modified independent (Device/Increase time)    Stand to Sit  6: Modified independent (Device/Increase time)    Stand Pivot Transfers  6: Modified independent (Device/Increase time)    Stand Pivot Transfer Details (indicate cue type and reason)  with and  without AD      Ambulation/Gait   Ambulation/Gait  Yes    Ambulation/Gait Assistance  4: Min assist    Ambulation/Gait Assistance Details  outside on paved surfaces with AFO but without AD with min guard and mild R foot drag on pavement and decreased R stance time but no LOB.  On mulch and gravel performed 3 reps forwards and backwards walking focusing on stability and increased stance time RLE and full step/stride length forwards and backwards.  Also performed lateral stepping on gravel and mulch for lateral weight shifting and hip ABD activation with min A.  As pt fatigued he demonstrated decreased stance on R, increased genu recurvatum and decreased trunk control.    Ambulation Distance (Feet)  230 Feet    Assistive device  None    Ambulation Surface  Unlevel;Outdoor;Paved;Gravel;Other (comment)   mulch   Curb  4: Min assist    Curb Details (indicate cue type and reason)  x3 reps with AFO but without AD; pt self selects to descend with LLE and ascend with RLE.  When descending pt hesitates before stepping RLE down with slight LOB backwards.  Demonstrated to patient how to keep body weight moving forwards and pushing off curb with RLE  to assist with forward weight shift and swing through of RLE.  When ascending pt continues to have to stop before intiating R foot up onto curb.  Discussed how therapy's goal would be for pt be able to maintain momentum and initiate stepping down or stepping up curb without stopping gait      Neuro Re-ed    Neuro Re-ed Details   Performed modified plank with UE supported on tall table - therapist provided verbal, visual and tactile cues for weight shift forwards over UE and activation of RUE and RLE extensors and trunk while performing 3 reps LLE extension, RLE extension and then attempting to perform LUE and RLE lift x 1             PT Education - 06/03/19 1604    Education Details  addition of more visits and continuation of therapy through June     Person(s) Educated  Patient;Parent(s)    Methods  Explanation    Comprehension  Verbalized understanding       PT Short Term Goals - 05/14/19 0847      PT SHORT TERM GOAL #1   Title  ALL STGS = LTGS        PT Long Term Goals - 05/14/19 0905      PT LONG TERM GOAL #1   Title  Pt will improve gait speed with quad cane and R AFO to at least 1.3 ft/sec in order to demo improved gait efficiency. ALL LTGS DUE 06/11/19    Baseline  .97 ft/sec on 3/331/21 with quad cane    Time  4    Period  Weeks    Status  New    Target Date  06/11/19      PT LONG TERM GOAL #2   Title  Pt will ambulate at least 200' over outdoor paved/gravel/mulch surfaces with supervision in order to improve functional mobility with small base quad cane while camping/going on walks with family.    Baseline  supervision over indoor suraces, min guard over outdoor paved surfaces when ambulating up inclines    Time  4    Period  Weeks    Status  On-going      PT LONG TERM GOAL #3   Title  Pt will perform a curb with supervision in order to improve community mobility and independence.    Baseline  min guard    Time  4    Period  Weeks    Status  On-going      PT LONG TERM GOAL #4   Title  Pt will perform all bed mobility from level mat table with mod I in order to decrease caregiver burden.    Time  4    Period  Weeks    Status  On-going      PT LONG TERM GOAL #5   Title  Patient will perform at least 4 steps using single handrail and step to pattern with supervision in order to safely enter/exit the RV.    Baseline  4 steps with single handrail and step to pattern - needs min A on steps to clear RLE due to lip on step, min guard when descending (needed initial reminder cues for proper technique)    Time  4    Period  Weeks    Status  On-going            Plan - 06/03/19 1605    Clinical Impression Statement  Continued to perform outdoor  and community gait training on a variety of surfaces with use of  AFO but without AD.  As pt fatigued he demonstrated greater difficulty maintaining postural control and stability during R stance.  Pt continues to demonstrate overall improvement in trunk and R sided activation with WB but continues to require cues for activation and sequencing.  Will continue to address and progress towards LTG.    Personal Factors and Comorbidities  --   pt ungergoing chemo/radiation   Examination-Activity Limitations  Bed Mobility;Locomotion Level;Sit;Squat;Stairs;Stand;Transfers    Examination-Participation Restrictions  Community Activity;School;Driving    Stability/Clinical Decision Making  Evolving/Moderate complexity    Rehab Potential  Good    PT Frequency  2x / week   taking a 2 week break due to pt and family going on vacation   PT Duration  2 weeks    PT Treatment/Interventions  ADLs/Self Care Home Management;Therapeutic exercise;Therapeutic activities;Functional mobility training;Stair training;Gait training;DME Instruction;Balance training;Neuromuscular re-education;Patient/family education;Orthotic Fit/Training;Wheelchair mobility training;Energy conservation;Passive range of motion    PT Next Visit Plan  Begin to check LTG and perform recert.  continue gait outdoors over unlevel surfaces/obstacles/curb and stairs with no AD. Needs to work on closed chain glute med activation on R.  Half kneeling/tall kneeling.    PT Home Exercise Plan  [id number]    Consulted and Agree with Plan of Care  Patient;Family member/caregiver    Family Member Consulted  Mom       Patient will benefit from skilled therapeutic intervention in order to improve the following deficits and impairments:  Abnormal gait, Decreased activity tolerance, Decreased balance, Decreased cognition, Decreased coordination, Decreased safety awareness, Decreased range of motion, Decreased mobility, Difficulty walking, Decreased strength, Decreased endurance, Impaired tone  Visit Diagnosis: Hemiplegia  affecting right dominant side, unspecified etiology, unspecified hemiplegia type (HCC)  Muscle weakness (generalized)  Other symptoms and signs involving the nervous system  Unsteadiness on feet  Other abnormalities of gait and mobility     Problem List Patient Active Problem List   Diagnosis Date Noted  . Goals of care, counseling/discussion 12/16/2018  . Palliative care by specialist   . Glioblastoma multiforme of temporal lobe ([facility]) 11/11/2018  . Spastic hemiparesis ([place])   . Cerebral edema (HCC)   . Intracranial tumor ([place])   . Steroid-induced hyperglycemia   . Spastic hemiplegia affecting nondominant side ([facility])   . Hyponatremia   . Transaminitis   . Leucocytosis   . Right spastic hemiplegia ([place]) 10/21/2018  . Dysphagia 10/21/2018  . 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 Brain mass   . ICH (intracerebral hemorrhage) ([place]) 10/13/2018    Rico Junker, PT, DPT 06/03/19    4:14 PM    [facility] [address] [place], [place], [address] Phone: [phone]   Fax:  [phone]  Name: Gregory Moreno MRN: [id number] Date of Birth: 1995-11-10

## 2019-06-03 NOTE — Therapy (Signed)
[facility] [address] [place], [place], [address] Phone: [phone]   Fax:  [phone]  Speech Language Pathology Treatment  Patient Details  Name: Gregory Moreno MRN: [id number] Date of Birth: February 23, 1995 Referring Provider (SLP): Alger Simons, MD   Encounter Date: 06/03/2019  End of Session - 06/03/19 1701    Visit Number  37    Number of Visits  41    Date for SLP Re-Evaluation  06/29/19    SLP Start Time  9450    SLP Stop Time   3888    SLP Time Calculation (min)  41 min    Activity Tolerance  Patient tolerated treatment well       History reviewed. No pertinent past medical history.  Past Surgical History:  Procedure Laterality Date  . CRANIOTOMY Left 10/13/2018   Procedure: LEFT CRANIOTOMY FOR TUMOR RESECTION;  Surgeon: Judith Part, MD;  Location: [facility];  Service: Neurosurgery;  Laterality: Left;    There were no vitals filed for this visit.  Subjective Assessment - 06/03/19 1406    Subjective  "How are you today?"    Patient is accompained by:  Family member   mom           ADULT SLP TREATMENT - 06/03/19 1407      General Information   Behavior/Cognition  Alert;Cooperative;Pleasant mood;Requires cueing      Treatment Provided   Treatment provided  Cognitive-Linquistic      Cognitive-Linquistic Treatment   Treatment focused on  Aphasia;Apraxia    Skilled Treatment  SLP used VNeST to work with pt's verbal expression. Pt generated three subjects and three objects with semantic, first letter, first sound, drawing cues (i.e., mod-max) occasionally. Max cues needed for 2/3 "where" responses. With a less-common verb pt required same level of cueing, usually, for generating 2 subjects and 2 objects. SLP told mother this task would be good to complete at home with pt. Mother stated she, dad, and AJ talk each day with pt to recap the events of the day.       Assessment /  Recommendations / Plan   Plan  Continue with current plan of care      Progression Toward Goals   Progression toward goals  Progressing toward goals       SLP Education - 06/03/19 1700    Education Details  VNeST would be a good task to engage with pt at home, rationale for VNeST    Person(s) Educated  Patient    Methods  Explanation    Comprehension  Verbalized understanding       SLP Short Term Goals - 04/29/19 1708      SLP SHORT TERM GOAL #1   Title  pt will complete aphasia assessment of auditory comprehension and verbal expression within 1-2 sessions    Status  Achieved      SLP SHORT TERM GOAL #2   Title  pt will demo understanding of simple commands (spoken) with 70% and occasional min cues over three sessions    Time  2    Period  Weeks    Status  On-going      SLP SHORT TERM GOAL #3   Title  pt will answer pertinent questions with at least 3 words    Status  Not Met      SLP SHORT TERM GOAL #4   Title  pt will demo sustained/selective attention necessary to complete efficacious speech therapy in 6  sessions    Baseline  04-07-19, 04-27-19, 04-29-19    Time  2    Period  Weeks   or 8 sessions   Status  On-going       SLP Long Term Goals - 06/03/19 1702      SLP LONG TERM GOAL #1   Title  pt will demo understanding of simple-mod complex conversation 75% of the time and rare min cues over three sessions    Baseline  02-06-19, 06-01-19, 06-03-19    Period  --   or 17 sessions, for all LTGs   Status  Achieved      SLP LONG TERM GOAL #2   Title  pt will answer 5 questions with [facility] verbal responses, in 3 sessions    Baseline  01-27-19, 04-29-19    Status  Achieved      SLP LONG TERM GOAL #3   Title  pt will sustain loud /a/ with average mid 70s dB over 4 sessions    Time  5    Period  Weeks    Status  Deferred   due to focus on language     SLP LONG TERM GOAL #4   Title  pt will engage in simple conversation for 5 minutes with average loudness mid 60s dB over  3 sessions    Time  5    Period  Weeks    Status  Deferred   due to focus on language     SLP LONG TERM GOAL #5   Title  pt will generate 2 subjects and 3 objects for a given transitive verb with occasional min-mod A    Time  3    Period  Weeks    Status  On-going       Plan - 06/03/19 1701    Clinical Impression Statement  Pt with mod Broca's aphasia, dysarthria/voice, and cognitive communication deficits. Pt also exhibits s/sx verbal apraxia. SLP targeted improved verbal epression with VNeST task today. See "skilled intervention" for more details on today's session. Pt would cont to benefit from skilled ST focusing on primarily receptive and expressive language but also at some point in rehab process focus on speech loudness and cognitive communication skills. Pt cont to require skilled ST focusing primarily on language skills.    Speech Therapy Frequency  2x / week    Duration  --   3 weeks or 41 total visits - see "clinical impression" for more details   Treatment/Interventions  Environmental controls;Functional tasks;Compensatory techniques;Multimodal communcation approach;SLP instruction and feedback;Cueing hierarchy;Language facilitation;Cognitive reorganization;Internal/external aids;Patient/family education    Potential to Achieve Goals  Good    Potential Considerations  Co-morbidities;Ability to learn/carryover information;Severity of impairments;Other (comment)       Patient will benefit from skilled therapeutic intervention in order to improve the following deficits and impairments:   Aphasia  Verbal apraxia  Dysarthria and anarthria  Cognitive communication deficit    Problem List Patient Active Problem List   Diagnosis Date Noted  . Goals of care, counseling/discussion 12/16/2018  . Palliative care by specialist   . Glioblastoma multiforme of temporal lobe ([place]) 11/11/2018  . Spastic hemiparesis ([place])   . Cerebral edema (HCC)   . Intracranial tumor ([place])   .  Steroid-induced hyperglycemia   . Spastic hemiplegia affecting nondominant side ([place])   . Hyponatremia   . Transaminitis   . Leucocytosis   . Right spastic hemiplegia ([place]) 10/21/2018  . Dysphagia 10/21/2018  . 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 Brain mass   . ICH (intracerebral hemorrhage) ([place]) 10/13/2018    [facility] ,[place], [place]  06/03/2019, 5:03 PM  [facility] [address] [place], [place], [address] Phone: [phone]   Fax:  [phone]   Name: CARNIE BRUEMMER MRN: [id number] Date of Birth: 11-27-1995

## 2019-06-03 NOTE — Telephone Encounter (Signed)
Scheduled appt per 5/18 los.  Left a vm of the appt date and time.

## 2019-06-04 ENCOUNTER — Encounter: Payer: Self-pay | Admitting: Physical Medicine and Rehabilitation

## 2019-06-04 ENCOUNTER — Encounter: Payer: 59 | Attending: Physical Medicine and Rehabilitation | Admitting: Physical Medicine and Rehabilitation

## 2019-06-04 ENCOUNTER — Other Ambulatory Visit: Payer: Self-pay

## 2019-06-04 VITALS — BP 121/77 | HR 89 | Temp 97.7°F | Ht 69.0 in | Wt 186.8 lb

## 2019-06-04 DIAGNOSIS — L299 Pruritus, unspecified: Secondary | ICD-10-CM | POA: Diagnosis not present

## 2019-06-04 DIAGNOSIS — G939 Disorder of brain, unspecified: Secondary | ICD-10-CM

## 2019-06-04 DIAGNOSIS — G8110 Spastic hemiplegia affecting unspecified side: Secondary | ICD-10-CM

## 2019-06-04 DIAGNOSIS — C712 Malignant neoplasm of temporal lobe: Secondary | ICD-10-CM | POA: Diagnosis present

## 2019-06-04 DIAGNOSIS — N3944 Nocturnal enuresis: Secondary | ICD-10-CM | POA: Diagnosis present

## 2019-06-04 DIAGNOSIS — R4701 Aphasia: Secondary | ICD-10-CM

## 2019-06-04 MED ORDER — SARNA 0.5-0.5 % EX LOTN
1.0000 | TOPICAL_LOTION | CUTANEOUS | 0 refills | Status: DC | PRN
Start: 2019-06-04 — End: 2019-11-26

## 2019-06-04 NOTE — Progress Notes (Signed)
Subjective:    Patient ID: Gregory Moreno, male    DOB: 09-18-1995, 24 y.o.   MRN: [id number]  HPI  Gregory Moreno presents for follow-up of spasticity related to GBM. Last visit I had injected the following regimen:  Total of 300U were injected as follows: Biceps Brachii: 100U: 50 in 4 sites Flexor Digitorum profundus: 30U in 1 site Flexor Pollicis Longus: 123456 in 1 site Flexor Digitorum Superficialis: 30U in 1 site Adductor Pollicis: 123456 in 1 site.  He had minimal benefit from this regimen and no side effects and would like to try a higher dose next time.   Continues to have improved walking speed, RLE strength, RUE strength to a lesser degree, cognition, and aphasia. He continues with outpatient therapy.  He had a recent vacation to [place] and did a lot of walking on uneven surfaces and was able to navigate this really well.  He has had some itching around 4 scabs around Gregory incision site.  Asks about return to driving protocol.   Pain Inventory Average Pain 0 Pain Right Now 0 My pain is no pain  In the last 24 hours, has pain interfered with the following? General activity 0 Relation with others 0 Enjoyment of life 0 What TIME of day is your pain at its worst? no pain Sleep (in general) Good  Pain is worse with: no pain Pain improves with: no pain Relief from Meds: no pain  Mobility use a cane ability to climb steps?  yes do you drive?  no  Function not employed: date last employed . disabled: date disabled .  Neuro/Psych No problems in this area  Prior Studies Any changes since last visit?  no  Physicians involved in your care Any changes since last visit?  no   No family history on file. Social History   Socioeconomic History  . Marital status: Single    Spouse name: Not on file  . Number of children: Not on file  . Years of education: Not on file  . Highest education level: Not on file  Occupational History  . Not on file  Tobacco Use  .  Smoking status: Never Smoker  . Smokeless tobacco: Never Used  Substance and Sexual Activity  . Alcohol use: Not on file  . Drug use: Not on file  . Sexual activity: Not on file  Other Topics Concern  . Not on file  Social History Narrative  . Not on file   Social Determinants of Health   Financial Resource Strain:   . Difficulty of Paying Living Expenses:   Food Insecurity:   . Worried About Charity fundraiser in the Last Year:   . Arboriculturist in the Last Year:   Transportation Needs:   . Film/video editor (Medical):   Marland Kitchen Lack of Transportation (Non-Medical):   Physical Activity:   . Days of Exercise per Week:   . Minutes of Exercise per Session:   Stress:   . Feeling of Stress :   Social Connections:   . Frequency of Communication with Friends and Family:   . Frequency of Social Gatherings with Friends and Family:   . Attends Religious Services:   . Active Member of Clubs or Organizations:   . Attends Archivist Meetings:   Marland Kitchen Marital Status:    Past Surgical History:  Procedure Laterality Date  . CRANIOTOMY Left 10/13/2018   Procedure: LEFT CRANIOTOMY FOR TUMOR RESECTION;  Surgeon: Judith Part,  MD;  Location: [facility];  Service: Neurosurgery;  Laterality: Left;   No past medical history on file. BP 121/77   Pulse 89   Temp 97.7 F (36.5 C)   Ht 5\' 9"  (1.753 m) Comment: reported  Wt 186 lb 12.8 oz (84.7 kg)   SpO2 97%   BMI 27.59 kg/m   Opioid Risk Score:   Fall Risk Score:  `1  Depression screen PHQ 2/9  Depression screen [facility] 2/9 06/04/2019 12/09/2018  Decreased Interest 0 0  Down, Depressed, Hopeless 0 0  PHQ - 2 Score 0 0  Altered sleeping - 0  Tired, decreased energy - 0  Change in appetite - 0  Feeling bad or failure about yourself  - 0  Trouble concentrating - 0  Moving slowly or fidgety/restless - 0  Suicidal thoughts - 0  PHQ-9 Score - 0  Some recent data might be hidden    Review of Systems  Constitutional:  Negative.   HENT: Negative.   Eyes: Negative.   Respiratory: Negative.   Cardiovascular: Negative.   Gastrointestinal: Negative.   Endocrine: Negative.   Genitourinary: Negative.   Musculoskeletal: Negative.   Skin: Negative.   Allergic/Immunologic: Negative.   Hematological: Negative.   Psychiatric/Behavioral: Negative.   All other systems reviewed and are negative.      Objective:   Physical Exam Gen: no distress, normal appearing HEENT: oral mucosa pink and moist, NCAT Cardio: Reg rate Chest: normal effort, normal rate of breathing Abd: soft, non-distended Ext: no edema Skin: Incision healing well; has 4 scabs.  Neuro: alert. Follows commands. Coordination slow but otherwise intact on left side. On right side, finger-to-nose impaired due to combination of weakness and impaired coordination.  Musculoskeletal: 5/5 strength on left side, 3/5 in right arm and 3/5 right hip flexion, knee extension, 0/5 right dorsiflexion and plantarflexion. MAS 1+ in right elbow flexors, MAS 1 in right wrist flexors and thumb flexor.  Psych: pleasant, flat affect    Assessment & Plan:  Gregory Moreno is a 24 year old man who presents for follow-up of Gregory glioblastoma.   Cognitive deficits: Continue SLP to work on memory, attention, neologisms, much improving!! Now off Ritalin and Amantadine. Advised walnuts, salmon, and blueberries for cognitive benefits.    Impaired gait and mobility: Continue outpatient PT and OT to work on right sided strength. Excellent that he has been ambulating without cane at times! This is allowing him to use Gregory left hand functionally while ambulating. And is achieving Gregory goals of returning to Gregory Moreno with Gregory Moreno.    Discussed risk of caregiver burnout and strategies to avoid. Patient is currently with excellent appetite, sleeping well, and without disturbing side effects from Gregory radiation. Does have nausea with chemo and takes Zofran regularly. He remains pain  free. Now off Baclofen.   Right sided spastic hemiparesis: He has MAS 1+ in elbow flexors and 1 in wrist flexors and thumb flexor, improved from prior after Botox injections. Discussed risks and benefits of Botox and patient and Moreno would like to try a higher dose in the right arm.  Increase regimen to the following: Total of 400U were injected as follows: Biceps Brachii: 200U: 50 in 4 sites Flexor Digitorum profundus: 30U in 1 site Flexor Pollicis Longus: 123456 in 1 site Flexor Digitorum Superficialis: 30U in 1 site Adductor Pollicis: 123456 in 1 site.  Itching along scalp scabs at incision site: Prescribed Sarna lotion. F/u with Dr. [name] regarding whether there are alternative treatment options.  Provided  extensive instructions regarding return to driving protocol and considerations- not at the time, but assured that he probably will be able to return to driving again with modifications.  RTC in 2 months for Botox injections.

## 2019-06-08 ENCOUNTER — Encounter: Payer: Self-pay | Admitting: Occupational Therapy

## 2019-06-08 ENCOUNTER — Ambulatory Visit: Payer: 59 | Admitting: Physical Therapy

## 2019-06-08 ENCOUNTER — Ambulatory Visit: Payer: 59

## 2019-06-08 ENCOUNTER — Other Ambulatory Visit: Payer: Self-pay

## 2019-06-08 ENCOUNTER — Ambulatory Visit: Payer: 59 | Admitting: Occupational Therapy

## 2019-06-08 DIAGNOSIS — R482 Apraxia: Secondary | ICD-10-CM

## 2019-06-08 DIAGNOSIS — R29818 Other symptoms and signs involving the nervous system: Secondary | ICD-10-CM

## 2019-06-08 DIAGNOSIS — R2689 Other abnormalities of gait and mobility: Secondary | ICD-10-CM

## 2019-06-08 DIAGNOSIS — R471 Dysarthria and anarthria: Secondary | ICD-10-CM

## 2019-06-08 DIAGNOSIS — M6281 Muscle weakness (generalized): Secondary | ICD-10-CM

## 2019-06-08 DIAGNOSIS — R2681 Unsteadiness on feet: Secondary | ICD-10-CM

## 2019-06-08 DIAGNOSIS — R41842 Visuospatial deficit: Secondary | ICD-10-CM

## 2019-06-08 DIAGNOSIS — G8191 Hemiplegia, unspecified affecting right dominant side: Secondary | ICD-10-CM | POA: Diagnosis not present

## 2019-06-08 DIAGNOSIS — R4701 Aphasia: Secondary | ICD-10-CM

## 2019-06-08 DIAGNOSIS — R41841 Cognitive communication deficit: Secondary | ICD-10-CM

## 2019-06-08 NOTE — Therapy (Signed)
[facility] [address] [place], [place], [address] Phone: [phone]   Fax:  [phone]  Occupational Therapy Treatment  Patient Details  Name: Gregory Moreno MRN: [id number] Date of Birth: [birth year] Referring Provider (OT): Marlowe Shores   Encounter Date: 06/08/2019  OT End of Session - 06/08/19 1813    Visit Number  41    Number of Visits  44    Date for OT Re-Evaluation  07/13/19    Authorization Type  No VL - No auth required    OT Start Time  1503    OT Stop Time  1546    OT Time Calculation (min)  43 min    Activity Tolerance  Patient tolerated treatment well       History reviewed. No pertinent past medical history.  Past Surgical History:  Procedure Laterality Date  . CRANIOTOMY Left 10/13/2018   Procedure: LEFT CRANIOTOMY FOR TUMOR RESECTION;  Surgeon: Judith Part, MD;  Location: [facility];  Service: Neurosurgery;  Laterality: Left;    There were no vitals filed for this visit.  Subjective Assessment - 06/08/19 1810    Subjective   I got my new leg brace    Patient is accompanied by:  Family member   mom   Pertinent History  Glioblastoma    Currently in Pain?  No/denies          Patient seen for aquatic therapy today.  Treatment took place in water 2.5-4 feet deep depending upon activity.  Pt entered the pool via ramp using 1 hand railing and mod facilitation, max cues for hip extension, trunk alignment and weight shifting. Utilized crouched walking with hand held assist to facilitate normal step length, decrease trunk rotation during advancement of RLE, facilitate more normal weight shift onto RLE during stance phase, and increase activation of RLE and trunk while decreasing spasticity in RUE.  Transitioned pt into supine using floatation devices - used suspended floating initially to decrease spasticity followed by Jeannette How to facilitate trunk elongation and flexion as well as rotation  during lower trunk on upper trunk movement in order to address R spasticity in trunk, RUE and RLE as well as to improve trunk rotation with good results. In supine addressed RUE ab/adduction to 90* with elbow in extension with mod cues and targets due to apraxia.  Pt also needs cues to grade movement in order to access isolated movement. Also addressed shoulder hyperextension with elbow extended and the cueing pt to "relax and let your arm float to the surface while your arm stays straight."  Pt with significant improvement in isolated RUE movement today however continues to require active assistance as well as moderate cueing.  In supine utilized aqua stretch techniques to address hip adductors as well as hip extension.  Pt then able to complete 10 reps x2 of R hip abduction and 10 reps x2 of hip extension in supine - using water and long lever arm for resistance.  Transitioned into standing and addressed coming into stance with full weight shift onto RLE with hip extension and alignment and then progressed to functional ambulation with light LUE support.  Pt needs mod - max facilitation and max cues for hip alignment to prevent knee hyperextension as well as to prevent R hip/trunk rotation and overuse of hip flexors to advance RLE which then also leads to increase RUE spasticity. With facilitation pt able to ambulate forward and backward with significant improvement in postural alignment.  Pt exited the pool via ramp and 1 hand railing with mod facilitation for postural alignment and cues for step length and to full weight bear onto RLE.                      OT Short Term Goals - 06/08/19 1810      OT SHORT TERM GOAL #1   Title  Patient will complete an HEP designed to improve active motion in RUE due 01/16/19    Status  Achieved      OT SHORT TERM GOAL #2   Title  Patient will reach forward with shoulder flexion, elbow extension pattern to make contact with target in midline and 8-12'  from chest with intermittent assist    Status  Achieved      OT SHORT TERM GOAL #3   Title  Patient will transfer from wheelchair to commode with no more than supervision assistance - transferring either toward right or toward left side    Status  Achieved      OT SHORT TERM GOAL #4   Title  Patient will grasp and release a cylindrical object 2-3" in diameter with min assist    Status  Achieved      OT SHORT TERM GOAL #5   Title  Patient will dress lower body with mod assist    Status  Achieved      OT SHORT TERM GOAL #6   Title  Patient will don/doff underwear, pants, and left shoe with min assist    Status  Achieved      OT SHORT TERM GOAL #7   Title  Patient will complete shower transfer with supervision    Status  Achieved      OT SHORT TERM GOAL #8   Title  Patient will bathe self in shower with supervision and cueing assistance    Status  Achieved        OT Long Term Goals - 06/08/19 1811      OT LONG TERM GOAL #1   Title  Patient will dress himself with no greater than set up assistance - 05/15/19    Status  Achieved      OT LONG TERM GOAL #2   Title  Patient will shower with supervision    Status  Achieved      OT LONG TERM GOAL #3   Title  Patient will cut food on plate with modified independence    Status  Achieved      OT LONG TERM GOAL #4   Title  Patient will demonstrate ability to retrieve a lightweight item (less than 2lb) from waist height or lower and transport 6-12 inches laterally with RUE    Status  Deferred      OT LONG TERM GOAL #5   Title  Patient will demonstrate RUE  mid level reach with minimal LUE support to hit a target at chest height directly in front of body    Status  Deferred      OT LONG TERM GOAL #6   Title  Patient will demonstrate improved postural alignment and control with functional mobility to reduce fall risk and spasticity in RUE and RLE. - 07/13/2019    Status  On-going      OT LONG TERM GOAL #7   Title  Pt and family  will demonstrate understanding of aquatic based HEP to address balance, spasticity mgmt, LE strengthening    Status  On-going  Plan - 06/08/19 1811    Clinical Impression Statement  Pt progressing toward goals - pt has new AFO for RLE.    OT Occupational Profile and History  Comprehensive Assessment- Review of records and extensive additional review of physical, cognitive, psychosocial history related to current functional performance    Occupational performance deficits (Please refer to evaluation for details):  ADL's;IADL's;Rest and Sleep;Education;Work;Play;Leisure;Social Participation    Body Structure / Function / Physical Skills  ADL;Decreased knowledge of use of DME;Gait;Strength;Balance;Dexterity;GMC;Tone;Body mechanics;Hearing;Proprioception;UE functional use;Endurance;IADL;ROM;Vestibular;Vision;Coordination;Flexibility;Mobility;Sensation;FMC;Decreased knowledge of precautions    Cognitive Skills  Attention;Problem Solve;Emotional;Safety Awareness;Sequencing;Memory;Learn;Thought;Understand;Perception    Psychosocial Skills  Routines and Behaviors;Interpersonal Interaction    Rehab Potential  Good    Clinical Decision Making  Multiple treatment options, significant modification of task necessary    Comorbidities Affecting Occupational Performance:  May have comorbidities impacting occupational performance    Modification or Assistance to Complete Evaluation   Min-Moderate modification of tasks or assist with assess necessary to complete eval    OT Frequency  1x / week    OT Duration  8 weeks    OT Treatment/Interventions  Self-care/ADL training;DME and/or AE instruction;Splinting;Balance training;Aquatic Therapy;Therapeutic activities;Therapeutic exercise;Cognitive remediation/compensation;Neuromuscular education;Functional Mobility Training;Visual/perceptual remediation/compensation;Electrical Stimulation;Manual Therapy;Patient/family education    Plan  aquatic therapy to  address postural alignment and control, incorporation and activation of R side to decrease spasitcity and reduce fall risk    Consulted and Agree with Plan of Care  Patient;Family member/caregiver    Family Member Consulted  mom       Patient will benefit from skilled therapeutic intervention in order to improve the following deficits and impairments:   Body Structure / Function / Physical Skills: ADL, Decreased knowledge of use of DME, Gait, Strength, Balance, Dexterity, GMC, Tone, Body mechanics, Hearing, Proprioception, UE functional use, Endurance, IADL, ROM, Vestibular, Vision, Coordination, Flexibility, Mobility, Sensation, FMC, Decreased knowledge of precautions Cognitive Skills: Attention, Problem Solve, Emotional, Safety Awareness, Sequencing, Memory, Learn, Thought, Understand, Perception Psychosocial Skills: Routines and Behaviors, Interpersonal Interaction   Visit Diagnosis: Hemiplegia affecting right dominant side, unspecified etiology, unspecified hemiplegia type (HCC)  Muscle weakness (generalized)  Other symptoms and signs involving the nervous system  Unsteadiness on feet  Apraxia  Visuospatial deficit    Problem List Patient Active Problem List   Diagnosis Date Noted  . Goals of care, counseling/discussion 12/16/2018  . Palliative care by specialist   . Glioblastoma multiforme of temporal lobe ([facility]) 11/11/2018  . Spastic hemiparesis ([facility])   . Cerebral edema (HCC)   . Intracranial tumor ([facility])   . Steroid-induced hyperglycemia   . Spastic hemiplegia affecting nondominant side ([facility])   . Hyponatremia   . Transaminitis   . Leucocytosis   . Right spastic hemiplegia ([facility]) 10/21/2018  . Dysphagia 10/21/2018  . 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 Brain mass   . ICH (intracerebral hemorrhage) ([facility]) 10/13/2018    Quay Burow, OTR/L 06/08/2019, 6:15 PM  [facility] [address] [place], [place], [address] Phone: [phone]   Fax:  [phone]  Name: Gregory Moreno MRN: [id number] Date of Birth: 25-Jul-1995

## 2019-06-08 NOTE — Therapy (Signed)
[facility] [address] [place], [place], [address] Phone: [phone]   Fax:  [phone]  Speech Language Pathology Treatment  Patient Details  Name: Gregory Moreno MRN: [id number] Date of Birth: [birth year] Referring Provider (SLP): Alger Simons, MD   Encounter Date: 06/08/2019  End of Session - 06/08/19 1357    Visit Number  38    Number of Visits  41    Date for SLP Re-Evaluation  06/29/19    SLP Start Time  4650    SLP Stop Time   1445    SLP Time Calculation (min)  40 min    Activity Tolerance  Patient tolerated treatment well       History reviewed. No pertinent past medical history.  Past Surgical History:  Procedure Laterality Date  . CRANIOTOMY Left 10/13/2018   Procedure: LEFT CRANIOTOMY FOR TUMOR RESECTION;  Surgeon: Judith Part, MD;  Location: [facility];  Service: Neurosurgery;  Laterality: Left;    There were no vitals filed for this visit.  Subjective Assessment - 06/08/19 1317    Subjective  Pt was independent with AM and PM meds - but not during chemo weeks.    Patient is accompained by:  Family member   mother   Currently in Pain?  No/denies            ADULT SLP TREATMENT - 06/08/19 1318      General Information   Behavior/Cognition  Alert;Cooperative;Pleasant mood;Requires cueing      Treatment Provided   Treatment provided  Cognitive-Linquistic      Cognitive-Linquistic Treatment   Treatment focused on  Apraxia;Aphasia    Skilled Treatment  [name] mother told SLP chemo since Thursday (today is last day). Pt used compenstory strategy of synonym to tell about the color of a dog staying with them. SLP took this opportunity to reiterate anomia techniques. SLP reiterated that phonemic/semancitc cues are just dine if listener knows the exact word pt is trying to say. SLP targeted verbal expression in conversation with emphasis on compensatory strategies for anomia. At end of  session pt stated he has difficulty with reading. SLP unsure if pt has more difficlty with comprehension or with seeing words, but SLP agreed best to assess pt's reading comprehension next session.      Assessment / Recommendations / Plan   Plan  Continue with current plan of care      Progression Toward Goals   Progression toward goals  Progressing toward goals       SLP Education - 06/08/19 1356    Education Details  SLP to do reading evaluation next visit    Person(s) Educated  Patient;Parent(s)    Methods  Explanation    Comprehension  Verbalized understanding       SLP Short Term Goals - 06/08/19 1536      SLP SHORT TERM GOAL #1   Title  pt will complete aphasia assessment of auditory comprehension and verbal expression within 1-2 sessions    Status  Achieved      SLP SHORT TERM GOAL #2   Title  pt will demo understanding of simple commands (spoken) with 70% and occasional min cues over three sessions    Time  1    Period  Weeks    Status  On-going      SLP SHORT TERM GOAL #3   Title  pt will answer pertinent questions with at least 3 words    Status  Not  Met      SLP SHORT TERM GOAL #4   Title  pt will demo sustained/selective attention necessary to complete efficacious speech therapy in 6 sessions    Baseline  04-07-19, 04-27-19, 04-29-19, 06-08-19    Time  1    Period  Weeks   or 8 sessions   Status  On-going       SLP Long Term Goals - 06/08/19 1536      SLP LONG TERM GOAL #1   Title  pt will demo understanding of simple-mod complex conversation 75% of the time and rare min cues over three sessions    Baseline  02-06-19, 06-01-19, 06-03-19    Period  --   or 17 sessions, for all LTGs   Status  Achieved      SLP LONG TERM GOAL #2   Title  pt will answer 5 questions with [facility] verbal responses, in 3 sessions    Baseline  01-27-19, 04-29-19    Status  Achieved      SLP LONG TERM GOAL #3   Title  pt will sustain loud /a/ with average mid 70s dB over 4 sessions     Time  5    Period  Weeks    Status  Deferred   due to focus on language     SLP LONG TERM GOAL #4   Title  pt will engage in simple conversation for 5 minutes with average loudness mid 60s dB over 3 sessions    Time  5    Period  Weeks    Status  Deferred   due to focus on language     SLP LONG TERM GOAL #5   Title  pt will generate 2 subjects and 3 objects for a given transitive verb with occasional min-mod A    Time  2    Period  Weeks    Status  On-going       Plan - 06/08/19 1357    Clinical Impression Statement  Pt with mod Broca's aphasia, dysarthria/voice, and cognitive communication deficits. Pt also exhibits s/sx verbal apraxia. SLP targeted improved verbal epression with conversitonal speech today, cueing pt for compensatory measures. Pt c/o difficlty reading - LSLP to assess pt's reading next session. See "skilled intervention" for more details on today's session. Pt would cont to benefit from skilled ST focusing on primarily receptive and expressive language but also at some point in rehab process focus on speech loudness and cognitive communication skills. Pt cont to require skilled ST focusing primarily on language skills.    Speech Therapy Frequency  2x / week    Duration  --   3 weeks or 41 total visits - see "clinical impression" for more details   Treatment/Interventions  Environmental controls;Functional tasks;Compensatory techniques;Multimodal communcation approach;SLP instruction and feedback;Cueing hierarchy;Language facilitation;Cognitive reorganization;Internal/external aids;Patient/family education    Potential to Achieve Goals  Good    Potential Considerations  Co-morbidities;Ability to learn/carryover information;Severity of impairments;Other (comment)       Patient will benefit from skilled therapeutic intervention in order to improve the following deficits and impairments:   Aphasia  Verbal apraxia  Cognitive communication deficit  Dysarthria and  anarthria    Problem List Patient Active Problem List   Diagnosis Date Noted  . Goals of care, counseling/discussion 12/16/2018  . Palliative care by specialist   . Glioblastoma multiforme of temporal lobe ([facility]) 11/11/2018  . Spastic hemiparesis ([facility])   . Cerebral edema (HCC)   . Intracranial tumor (  HCC)   . Steroid-induced hyperglycemia   . Spastic hemiplegia affecting nondominant side ([facility])   . Hyponatremia   . Transaminitis   . Leucocytosis   . Right spastic hemiplegia ([facility]) 10/21/2018  . Dysphagia 10/21/2018  . 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 Brain mass   . ICH (intracerebral hemorrhage) ([facility]) 10/13/2018    [facility], [place]  06/08/2019, 3:37 PM  [facility] [address] [place], [place], [address] Phone: [phone]   Fax:  [phone]   Name: LUKA STOHR MRN: [id number] Date of Birth: December 28, 1995

## 2019-06-08 NOTE — Patient Instructions (Signed)
  Ways to compensate for word finding:  1) "Talk around the word"- say the sentence a different way  2) Think of a synonym - a word that means the same thing as the target word  3) Describe the thing you are trying to think of

## 2019-06-08 NOTE — Therapy (Signed)
[facility] [address] [place], [place], [address] Phone: [phone]   Fax:  [phone]  Physical Therapy Treatment  Patient Details  Name: Gregory Moreno MRN: [id number] Date of Birth: 11/28/1995 Referring Provider (PT): Zachary Swartz, MD   Encounter Date: 06/08/2019  PT End of Session - 06/08/19 2003    Visit Number  40    Number of Visits  41    Date for PT Re-Evaluation  06/13/19    Authorization Type  [employer] - 2021, no VL, $40 copay per therapy per day    PT Start Time  1233    PT Stop Time  1315    PT Time Calculation (min)  42 min    Equipment Utilized During Treatment  Gait belt    Activity Tolerance  Patient tolerated treatment well    Behavior During Therapy  WFL for tasks assessed/performed       No past medical history on file.  Past Surgical History:  Procedure Laterality Date  . CRANIOTOMY Left 10/13/2018   Procedure: LEFT CRANIOTOMY FOR TUMOR RESECTION;  Surgeon: Ostergard, Thomas A, MD;  Location: [facility];  Service: Neurosurgery;  Laterality: Left;    There were no vitals filed for this visit.  Subjective Assessment - 06/08/19 1237    Subjective  was more tired this weekend after chemo. No nausea. Wants to keep working on his balance and walking.    Patient is accompained by:  Family member    How long can you walk comfortably?  100' with hemiwalker    Patient Stated Goals  wants to improve his walking, get back to normal    Currently in Pain?  No/denies                        [facility] Adult PT Treatment/Exercise - 06/08/19 0001      Ambulation/Gait   Ambulation/Gait  Yes    Ambulation/Gait Assistance  4: Min assist    Ambulation/Gait Assistance Details  around therapy gym with no AD, manual facilitation for weight shifting on RLE and R hip ABD activation for improved step length with LLE. pt not wearing a wedge in R shoe today, put a R wedge in, with pt demonstrating decr R  knee hyperextension. pt reports he has some wedges at home, but was not sure if he should still use one in his new shoes    Ambulation Distance (Feet)  115 Feet   x2   Assistive device  None    Gait Pattern  Step-through pattern;Decreased hip/knee flexion - right;Decreased dorsiflexion - right;Right genu recurvatum;Poor foot clearance - right    Ambulation Surface  Level    Gait velocity  17.6 seconds = 1.86 ft/sec   with small based quad cane     Neuro Re-ed    Neuro Re-ed Details   Stepping on black aerobic step for a modified curb: pt self selecting to step up with RLE x5 reps with min guard for balance and stepping off backwards with LLE for SLS on RLE with no UE support, performed x3 reps with pt stepping up first with LLE - needing close min guard/min A for RLE foot clearance onto step. Performed an additional 3 reps stepping up with RLE and then stepping down with RLE to descend curb. Standing at staircase: initially with UE support and progressing to none x10 reps R step ups onto 1st step, cues for weight shift. Performing SLS on RLE   and to incr weight bearing during stance x10 reps weight shifting towards R and tapping LLE to step x5 reps and then performing x5 reps of keeping LLE on step with verbal and tactile cues for R quad and glute activation during SLS - min/mod A for balance. Pt fatigued easily with incr clonus. On red mat for compliant surface: x10 reps lateral step overs with LLE over yard stick for SLS, weight shift, and closed chain hip ABD, min guard/min A for balance                PT Short Term Goals - 05/14/19 0847      PT SHORT TERM GOAL #1   Title  ALL STGS = LTGS        PT Long Term Goals - 06/08/19 1249      PT LONG TERM GOAL #1   Title  Pt will improve gait speed with quad cane and R AFO to at least 1.3 ft/sec in order to demo improved gait efficiency. ALL LTGS DUE 06/11/19    Baseline  1.87 ft/sec with small base quad cane    Time  4    Period  Weeks     Status  Achieved      PT LONG TERM GOAL #2   Title  Pt will ambulate at least 200' over outdoor paved/gravel/mulch surfaces with supervision in order to improve functional mobility with small base quad cane while camping/going on walks with family.    Baseline  supervision over indoor suraces, min guard over outdoor paved surfaces when ambulating up inclines    Time  4    Period  Weeks    Status  On-going      PT LONG TERM GOAL #3   Title  Pt will perform a curb with supervision in order to improve community mobility and independence.    Baseline  min guard    Time  4    Period  Weeks    Status  On-going      PT LONG TERM GOAL #4   Title  Pt will perform all bed mobility from level mat table with mod I in order to decrease caregiver burden.    Time  4    Period  Weeks    Status  On-going      PT LONG TERM GOAL #5   Title  Patient will perform at least 4 steps using single handrail and step to pattern with supervision in order to safely enter/exit the RV.    Baseline  4 steps with single handrail and step to pattern - needs min A on steps to clear RLE due to lip on step, min guard when descending (needed initial reminder cues for proper technique)    Time  4    Period  Weeks    Status  On-going            Plan - 06/08/19 2004    Clinical Impression Statement  Continued to perform gait during session with no AD. Min A for weight shifting during stance onto RLE for improved step length with LLE. Pt with improvements in dynamic balance today - able to perform side steps with LLE over yardstick on a compliant surface with no UE support. Pt continues to need cues for RLE quad and glute activation during SLS activities. Pt has met LTG #1 - improved gait speed with small base quad cane to 1.86 ft/sec (previously was .97 ft/sec), indicating pt is a limited  community ambulator. Will continue to progress towards LTGs.    Personal Factors and Comorbidities  --   pt ungergoing  chemo/radiation   Examination-Activity Limitations  Bed Mobility;Locomotion Level;Sit;Squat;Stairs;Stand;Transfers    Examination-Participation Restrictions  Community Activity;School;Driving    Stability/Clinical Decision Making  Evolving/Moderate complexity    Rehab Potential  Good    PT Frequency  2x / week   taking a 2 week break due to pt and family going on vacation   PT Duration  2 weeks    PT Treatment/Interventions  ADLs/Self Care Home Management;Therapeutic exercise;Therapeutic activities;Functional mobility training;Stair training;Gait training;DME Instruction;Balance training;Neuromuscular re-education;Patient/family education;Orthotic Fit/Training;Wheelchair mobility training;Energy conservation;Passive range of motion    PT Next Visit Plan  Begin to check LTG and perform recert.  continue gait outdoors over unlevel surfaces/obstacles/curb and stairs with no AD. Needs to work on closed chain glute med activation on R.  Half kneeling/tall kneeling.    PT Home Exercise Plan  [email or web address]    Consulted and Agree with Plan of Care  Patient;Family member/caregiver    Family Member Consulted  Mom       Patient will benefit from skilled therapeutic intervention in order to improve the following deficits and impairments:  Abnormal gait, Decreased activity tolerance, Decreased balance, Decreased cognition, Decreased coordination, Decreased safety awareness, Decreased range of motion, Decreased mobility, Difficulty walking, Decreased strength, Decreased endurance, Impaired tone  Visit Diagnosis: Muscle weakness (generalized)  Other symptoms and signs involving the nervous system  Unsteadiness on feet  Other abnormalities of gait and mobility     Problem List Patient Active Problem List   Diagnosis Date Noted  . Goals of care, counseling/discussion 12/16/2018  . Palliative care by specialist   . 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 Spastic hemiparesis (HCC)    . Cerebral edema (HCC)   . Intracranial tumor (HCC)   . Steroid-induced hyperglycemia   . Spastic hemiplegia affecting nondominant side (HCC)   . Hyponatremia   . Transaminitis   . Leucocytosis   . Right spastic hemiplegia (HCC) 10/21/2018  . Dysphagia 10/21/2018  . 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 Brain mass   . ICH (intracerebral hemorrhage) (HCC) 10/13/2018     N , PT, DPT  06/08/2019, 8:16 PM  [facility] [address] [place], [place], [address] Phone: [phone]   Fax:  [phone]  Name: Terri III Charpentier MRN: [id number] Date of Birth: 02/26/1995   

## 2019-06-10 ENCOUNTER — Ambulatory Visit: Payer: 59

## 2019-06-10 ENCOUNTER — Ambulatory Visit: Payer: 59 | Admitting: Physical Therapy

## 2019-06-10 ENCOUNTER — Other Ambulatory Visit: Payer: Self-pay | Admitting: Internal Medicine

## 2019-06-10 ENCOUNTER — Other Ambulatory Visit: Payer: Self-pay

## 2019-06-10 ENCOUNTER — Encounter: Payer: Self-pay | Admitting: Physical Therapy

## 2019-06-10 DIAGNOSIS — G8191 Hemiplegia, unspecified affecting right dominant side: Secondary | ICD-10-CM

## 2019-06-10 DIAGNOSIS — R2689 Other abnormalities of gait and mobility: Secondary | ICD-10-CM

## 2019-06-10 DIAGNOSIS — R41841 Cognitive communication deficit: Secondary | ICD-10-CM

## 2019-06-10 DIAGNOSIS — R482 Apraxia: Secondary | ICD-10-CM

## 2019-06-10 DIAGNOSIS — R4701 Aphasia: Secondary | ICD-10-CM

## 2019-06-10 DIAGNOSIS — M6281 Muscle weakness (generalized): Secondary | ICD-10-CM

## 2019-06-10 DIAGNOSIS — R471 Dysarthria and anarthria: Secondary | ICD-10-CM

## 2019-06-10 DIAGNOSIS — R2681 Unsteadiness on feet: Secondary | ICD-10-CM

## 2019-06-10 DIAGNOSIS — R29818 Other symptoms and signs involving the nervous system: Secondary | ICD-10-CM

## 2019-06-10 DIAGNOSIS — C712 Malignant neoplasm of temporal lobe: Secondary | ICD-10-CM

## 2019-06-10 NOTE — Therapy (Signed)
[facility] [address] [place], [place], [address] Phone: [phone]   Fax:  [phone]  Speech Language Pathology Treatment  Patient Details  Name: Gregory Moreno MRN: [id number] Date of Birth: [birth year] Referring Provider (SLP): Alger Simons, MD   Encounter Date: 06/10/2019  End of Session - 06/10/19 1810    Visit Number  39    Number of Visits  43    Date for SLP Re-Evaluation  08/21/19   pushed date out in case pt not seen until 07-22-19   SLP Start Time  1450    SLP Stop Time   1530    SLP Time Calculation (min)  40 min       No past medical history on file.  Past Surgical History:  Procedure Laterality Date  . CRANIOTOMY Left 10/13/2018   Procedure: LEFT CRANIOTOMY FOR TUMOR RESECTION;  Surgeon: Judith Part, MD;  Location: [facility];  Service: Neurosurgery;  Laterality: Left;    There were no vitals filed for this visit.         ADULT SLP TREATMENT - 06/10/19 1806      General Information   Behavior/Cognition  Alert;Cooperative;Pleasant mood;Requires cueing      Treatment Provided   Treatment provided  Cognitive-Linquistic      Cognitive-Linquistic Treatment   Treatment focused on  Aphasia;Apraxia    Skilled Treatment  Pt was administered the Reading Comprehension Battery of Aphasia -2 today. "Semantic Categoriztion" subtest of RCBA next session. Due to scheduling preferences, pt was not scheduled for any further ST until  07-22-19. SLP instructed pt mother to stop at appointment desk to ascertain if another SLP could see pt prior to 07-22-19. IF not, patient may be put on hold until then.       Assessment / Recommendations / Plan   Plan  Other (Comment);Goals updated   possibly put pt on hold if no ST until 07-22-19     Progression Toward Goals   Progression toward goals  Progressing toward goals         SLP Short Term Goals - 06/08/19 1536      SLP SHORT TERM GOAL #1   Title   pt will complete aphasia assessment of auditory comprehension and verbal expression within 1-2 sessions    Status  Achieved      SLP SHORT TERM GOAL #2   Title  pt will demo understanding of simple commands (spoken) with 70% and occasional min cues over three sessions    Time  1    Period  Weeks    Status  On-going      SLP SHORT TERM GOAL #3   Title  pt will answer pertinent questions with at least 3 words    Status  Not Met      SLP SHORT TERM GOAL #4   Title  pt will demo sustained/selective attention necessary to complete efficacious speech therapy in 6 sessions    Baseline  04-07-19, 04-27-19, 04-29-19, 06-08-19    Time  1    Period  Weeks   or 8 sessions   Status  On-going       SLP Long Term Goals - 06/10/19 1815      SLP LONG TERM GOAL #1   Title  pt will demo understanding of simple-mod complex conversation 75% of the time and rare min cues over three sessions    Baseline  02-06-19, 06-01-19, 06-03-19    Period  --  or 17 sessions, for all LTGs   Status  Achieved      SLP LONG TERM GOAL #2   Title  pt will answer 5 questions with [facility] verbal responses, in 3 sessions    Baseline  01-27-19, 04-29-19    Status  Achieved      SLP LONG TERM GOAL #3   Title  pt will sustain loud /a/ with average mid 70s dB over 4 sessions    Time  5    Period  Weeks    Status  Deferred   due to focus on language     SLP LONG TERM GOAL #4   Title  pt will engage in simple conversation for 5 minutes with average loudness mid 60s dB over 3 sessions    Time  5    Period  Weeks    Status  Deferred   due to focus on language     SLP LONG TERM GOAL #5   Title  pt will generate 2 subjects and 3 objects for a given transitive verb with occasional min-mod A    Time  4    Period  Weeks   or 47 total visits (for LTGs 5 and on); renewed 06-10-19   Status  Not Met   and ongoing     Additional Long Term Goals   Additional Long Term Goals  Yes      SLP LONG TERM GOAL #6   Title  pt will  participate functionally simple-mod complex conversation for 8 minutes in 2 sessions (receptively and expressively)    Time  4    Period  Weeks    Status  New      SLP LONG TERM GOAL #7   Title  pt will successfully access text-to-speech function on his phone in 3 sessions    Time  4    Period  Weeks    Status  New      SLP LONG TERM GOAL #8   Title  pt will demo understanding of >/= 1 sentence text messages, given modifications and compensations, in 3 sessions    Time  4    Period  Weeks    Status  New       Plan - 06/10/19 1813    Clinical Impression Statement  Pt with mod Broca's aphasia, dysarthria/voice, and cognitive communication deficits.  Pt also exhibits s/sx verbal apraxia. He continues to see slow improvement in verbal expression, auditory comprehension, and cognition. Pt c/o difficlty reading - SLP assessed pt's reading and found pt mostly successful at word level but as written text lengthens pt's success in comprehension decreases. Added goal/s for reading today. See "skilled intervention" for more details on today's session. Pt would cont to benefit from skilled ST focusing on primarily receptive and expressive language but also at some point in rehab process focus on speech loudness and cognitive communication skills. Pt cont to require skilled ST focusing primarily on language skills.    Speech Therapy Frequency  2x / week    Duration  --   3 weeks or 41 total visits - see "clinical impression" for more details   Treatment/Interventions  Environmental controls;Functional tasks;Compensatory techniques;Multimodal communcation approach;SLP instruction and feedback;Cueing hierarchy;Language facilitation;Cognitive reorganization;Internal/external aids;Patient/family education    Potential to Achieve Goals  Good    Potential Considerations  Co-morbidities;Ability to learn/carryover information;Severity of impairments;Other (comment)       Patient will benefit from skilled  therapeutic intervention in order to improve  the following deficits and impairments:   Aphasia - Plan: SLP plan of care cert/re-cert  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 Goals of care, counseling/discussion 12/16/2018  . Palliative care by specialist   . Glioblastoma multiforme of temporal lobe ([facility]) 11/11/2018  . Spastic hemiparesis ([facility])   . Cerebral edema (HCC)   . Intracranial tumor ([place])   . Steroid-induced hyperglycemia   . Spastic hemiplegia affecting nondominant side ([facility])   . Hyponatremia   . Transaminitis   . Leucocytosis   . Right spastic hemiplegia ([place]) 10/21/2018  . Dysphagia 10/21/2018  . 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 Brain mass   . ICH (intracerebral hemorrhage) ([place]) 10/13/2018    [facility] ,[place], [place]  06/10/2019, 6:23 PM  [facility] [address] [place], [place], [address] Phone: [phone]   Fax:  [phone]   Name: Gregory Moreno MRN: [id number] Date of Birth: 1995/07/27

## 2019-06-11 NOTE — Therapy (Signed)
[facility] [address] [place], [place], [address] Phone: [phone]   Fax:  [phone]  Physical Therapy Treatment/Re-Cert  Patient Details  Name: BALTHAZAR DOOLY MRN: [id number] Date of Birth: [birth year] Referring Provider (PT): Alger Simons, MD   Encounter Date: 06/10/2019  PT End of Session - 06/11/19 0913    Visit Number  41    Number of Visits  57    Date for PT Re-Evaluation  08/12/19    Authorization Type  [employer] - 2021, no VL, $40 copay per day (regardless of how many visits)    PT Start Time  1401    PT Stop Time  1446    PT Time Calculation (min)  45 min    Equipment Utilized During Treatment  Gait belt    Activity Tolerance  Patient tolerated treatment well    Behavior During Therapy  [facility] for tasks assessed/performed       History reviewed. No pertinent past medical history.  Past Surgical History:  Procedure Laterality Date  . CRANIOTOMY Left 10/13/2018   Procedure: LEFT CRANIOTOMY FOR TUMOR RESECTION;  Surgeon: Judith Part, MD;  Location: [facility];  Service: Neurosurgery;  Laterality: Left;    There were no vitals filed for this visit.  Subjective Assessment - 06/10/19 1403    Subjective  the pool was awesome - worked on his arm.    Patient is accompained by:  Family member    How long can you walk comfortably?  100' with hemiwalker    Patient Stated Goals  wants to improve his walking, get back to normal    Currently in Pain?  No/denies                        [facility] Adult PT Treatment/Exercise - 06/11/19 0920      Transfers   Transfers  Sit to Stand;Stand to Sit    Sit to Stand  6: Modified independent (Device/Increase time)    Sit to Stand Details (indicate cue type and reason)  multiple reps throughout session with cues for no UE support, pt at times with decr eccentric control when lowering    Stand Pivot Transfers  5: Supervision    Stand Pivot Transfer Details  (indicate cue type and reason)  without AD      Ambulation/Gait   Ambulation/Gait  Yes    Ambulation/Gait Assistance  4: Min guard;4: Min assist    Ambulation/Gait Assistance Details  Outside on paved surfaces with R AFO but no AD, min guard and min A when ambulating up inclines and occasional assist for hip ABD activation during stance on RLE for incr weight bearing for incr LLE step length. With ambulating on pavement, had pt scan environment and perform head turns and asked pt to name color of the car to pt's R for dual tasking with gait. Ambulated in the grass with min guard/min A for balance with pt demonstrating slower gait speed and needed assist for weight shift to R for longer step length with LLE. Performed side stepping down and back 8' x2 reps in grass with min A and cues for weight shift - no LOB      Ambulation Distance (Feet)  300 Feet   plus additional 100' indoors   Assistive device  None    Gait Pattern  Step-through pattern;Decreased hip/knee flexion - right;Decreased dorsiflexion - right;Right genu recurvatum;Poor foot clearance - right    Ambulation Surface  Level;Unlevel;Indoor;Outdoor;Paved;Grass    Stairs  Yes    Stairs Assistance  4: Min guard;4: Min assist;5: Supervision    Stairs Assistance Details (indicate cue type and reason)  pt able to clear RLE on step a couple of reps using compensatory hip hiking to clear due to RLE hip flexion and knee flexion weakness, needing min A at times to help clear RLE due to lip on step. while descending needed initial verbal cue for technique, able to descend with supervision/min guard    Stair Management Technique  One rail Left;Step to pattern;Forwards    Number of Stairs  8    Height of Stairs  6    Curb  Other (comment)   min guard   Curb Details (indicate cue type and reason)  x4 reps with AFO and no AD, performed with descending with RLE and pt choosing to either ascend with RLE or LLE. no LOB while descending, however pt needing  closer min guard when deciding to step up first with RLE    Gait Comments  discussed with pt and pt's mom to bring in bag of wedges that pt has received from [employer] to determine which size would be best      Neuro Re-ed    Neuro Re-ed Details   In // bars: standing on rocker board with 1" foam pad for compliant surface: M/L weight shifting with no UE support with focus on R quad activation and shifting fully to R -with pt needing intermittent UE support with L hand x10 reps, then holding board still with R quad activation with x5 reps head turns and x5 reps head nods, eyes closed balance 5 x 10-15 second reps with min guard/ min A. In A/P direction with 1" foam eyes closed 4 x 10 second reps with min A for balance due to pt losing balance posteriorly.              PT Education - 06/11/19 0912    Education Details  progress towards goals, continuation of therapy through end of July    Person(s) Educated  Patient;Parent(s)    Methods  Explanation    Comprehension  Verbalized understanding       PT Short Term Goals - 05/14/19 0847      PT SHORT TERM GOAL #1   Title  ALL STGS = LTGS      New STGs for re-cert:  PT Short Term Goals - 06/11/19 1654      PT SHORT TERM GOAL #1   Title  Pt will undergo further assessment of gait speed with no AD - LTG to be written as appropriate. ALL STGS DUE 07/09/19    Time  4    Period  Weeks    Status  New    Target Date  07/09/19      PT SHORT TERM GOAL #2   Title  Pt will undergo further assessment of TUG with no AD in order to determine fall risk - LTG to be written as appropriate    Time  4    Period  Weeks    Status  New      PT SHORT TERM GOAL #3   Title  Pt will improve gait speed with small base quad cane to at least 2.1 ft/sec in order to improve community mobility.    Baseline  1.87 ft/sec    Time  4    Period  Weeks    Status  New  PT SHORT TERM GOAL #4   Title  Pt will perform a curb with supervision with no AD in order  to improve community mobility and independence    Time  4    Period  Weeks    Status  New      PT SHORT TERM GOAL #5   Title  Pt will ambulate at least 100' outdoors on grass with min guard with no AD in order to improve functional mobility while ambulating outdoors with family.    Baseline  min A at times for balance    Time  4    Period  Weeks         PT Long Term Goals - 06/11/19 0915      PT LONG TERM GOAL #1   Title  Pt will improve gait speed with quad cane and R AFO to at least 1.3 ft/sec in order to demo improved gait efficiency. ALL LTGS DUE 06/11/19    Baseline  1.87 ft/sec with small base quad cane    Time  4    Period  Weeks    Status  Achieved      PT LONG TERM GOAL #2   Title  Pt will ambulate at least 200' over outdoor paved/gravel/mulch surfaces with supervision in order to improve functional mobility with small base quad cane while camping/going on walks with family.    Baseline  supervision over indoor suraces, min guard/min A over outdoor surfaces with no AD    Time  4    Period  Weeks    Status  Partially Met      PT LONG TERM GOAL #3   Title  Pt will perform a curb with supervision in order to improve community mobility and independence.    Baseline  min guard with no AD    Time  4    Period  Weeks    Status  Partially Met      PT LONG TERM GOAL #4   Title  Pt will perform all bed mobility from level mat table with mod I in order to decrease caregiver burden.    Time  4    Period  Weeks    Status  Achieved      PT LONG TERM GOAL #5   Title  Patient will perform at least 4 steps using single handrail and step to pattern with supervision in order to safely enter/exit the RV.    Baseline  4 steps with single handrail and step to pattern - needs min A at times, overwise min guard on steps to clear RLE due to lip on step, min guard/supervision while descending    Time  4    Period  Weeks    Status  Partially Met       new/going LTGs for  re-cert:  PT Long Term Goals - 06/11/19 1657      PT LONG TERM GOAL #1   Title  Gait speed goal to be written as appropriate with no AD. ALL LTGS DUE 08/10/19    Baseline  1.87 ft/sec with small base quad cane    Time  8    Period  Weeks    Status  New    Target Date  08/10/19      PT LONG TERM GOAL #2   Title  Pt will ambulate at least 200' over outdoor paved/gravel/mulch surfaces with supervision in order to improve functional mobility with no AD while camping/going on  walks with family.    Baseline  supervision over indoor suraces, min guard/min A over outdoor surfaces with no AD    Time  8    Period  Weeks    Status  New      PT LONG TERM GOAL #3   Title  Pt will perform a curb with MOD I with no AD in order to improve community mobility and independence.    Baseline  min guard with no AD    Time  8    Period  Weeks    Status  Revised      PT LONG TERM GOAL #4   Title  TUG goal to be written as appropriate to determine fall risk.    Time  8    Period  Weeks    Status  New      PT LONG TERM GOAL #5   Title  Patient will perform at least 4 steps using single handrail and step to pattern with supervision in order to safely enter/exit the RV.    Baseline  4 steps with single handrail and step to pattern - needs min A at times, overwise min guard on steps to clear RLE due to lip on step, min guard/supervision while descending    Time  8    Period  Weeks    Status  Revised          Plan - 06/11/19 1653    Clinical Impression Statement  Focus of today's skilled session was assessing remainder of pt's LTGs. Pt is making excellent progress with PT - pt has met 2 out of 5 LTGs and partially met 3 out of 5 LTGs. Pt continues to need min A at times when ascending stairs due to inability to clear RLE at times due to pt's shoe getting caught on the lip of the step. However, pt able to demonstrate being able to clear RLE himself, however does it by compensatory circumduction. Pt able  to ambulate over outdoor paved/grass surfaces with min guard/min A for balance. Demonstrates incr R knee hyperextension and R toe drag when fatigued after gait. Will re-cert for an additional 2x week for 8 weeks to continue to focus on improving gait mechanics and balance, increasing independence with functional mobility and decreasing pt's fall risk. STGs and LTGs revised and updated as appropriate.    Personal Factors and Comorbidities  --   pt ungergoing chemo/radiation   Examination-Activity Limitations  Bed Mobility;Locomotion Level;Sit;Squat;Stairs;Stand;Transfers    Examination-Participation Restrictions  Community Activity;School;Driving    Stability/Clinical Decision Making  Evolving/Moderate complexity    Rehab Potential  Good    PT Frequency  2x / week    PT Duration  8 weeks    PT Treatment/Interventions  ADLs/Self Care Home Management;Therapeutic exercise;Therapeutic activities;Functional mobility training;Stair training;Gait training;DME Instruction;Balance training;Neuromuscular re-education;Patient/family education;Orthotic Fit/Training;Wheelchair mobility training;Energy conservation;Passive range of motion    PT Next Visit Plan  continue gait outdoors over unlevel surfaces/obstacles/curb and stairs with no AD. Needs to work on closed chain glute med activation on R.  Half kneeling/tall kneeling for core/hip strengthening and balance. .    PT Home Exercise Plan  [id number]    Consulted and Agree with Plan of Care  Patient;Family member/caregiver    Family Member Consulted  Mom       Patient will benefit from skilled therapeutic intervention in order to improve the following deficits and impairments:  Abnormal gait, Decreased activity tolerance, Decreased balance, Decreased cognition, Decreased coordination, Decreased safety  awareness, Decreased range of motion, Decreased mobility, Difficulty walking, Decreased strength, Decreased endurance, Impaired tone  Visit Diagnosis: Muscle  weakness (generalized)  Hemiplegia affecting right dominant side, unspecified etiology, unspecified hemiplegia type (HCC)  Other symptoms and signs involving the nervous system  Unsteadiness on feet  Other abnormalities of gait and mobility     Problem List Patient Active Problem List   Diagnosis Date Noted  . Goals of care, counseling/discussion 12/16/2018  . Palliative care by specialist   . 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 Spastic hemiparesis ([place])   . Cerebral edema (HCC)   . Intracranial tumor ([place])   . Steroid-induced hyperglycemia   . Spastic hemiplegia affecting nondominant side ([place])   . Hyponatremia   . Transaminitis   . Leucocytosis   . Right spastic hemiplegia ([place]) 10/21/2018  . Dysphagia 10/21/2018  . 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 Brain mass   . ICH (intracerebral hemorrhage) ([place]) 10/13/2018    Arliss Journey, PT, DPT  06/11/2019, 4:54 PM  [facility] [address] [place], [place], [address] Phone: [phone]   Fax:  [phone]  Name: BREVIN MCFADDEN MRN: [id number] Date of Birth: 09-16-1995

## 2019-06-22 ENCOUNTER — Other Ambulatory Visit: Payer: Self-pay

## 2019-06-22 ENCOUNTER — Ambulatory Visit: Payer: 59 | Attending: Gastroenterology | Admitting: Occupational Therapy

## 2019-06-22 ENCOUNTER — Encounter: Payer: Self-pay | Admitting: Occupational Therapy

## 2019-06-22 DIAGNOSIS — R4701 Aphasia: Secondary | ICD-10-CM | POA: Insufficient documentation

## 2019-06-22 DIAGNOSIS — M6281 Muscle weakness (generalized): Secondary | ICD-10-CM | POA: Diagnosis present

## 2019-06-22 DIAGNOSIS — G8191 Hemiplegia, unspecified affecting right dominant side: Secondary | ICD-10-CM | POA: Diagnosis present

## 2019-06-22 DIAGNOSIS — R29818 Other symptoms and signs involving the nervous system: Secondary | ICD-10-CM | POA: Diagnosis present

## 2019-06-22 DIAGNOSIS — R482 Apraxia: Secondary | ICD-10-CM | POA: Diagnosis present

## 2019-06-22 DIAGNOSIS — R2689 Other abnormalities of gait and mobility: Secondary | ICD-10-CM | POA: Insufficient documentation

## 2019-06-22 DIAGNOSIS — R41842 Visuospatial deficit: Secondary | ICD-10-CM | POA: Diagnosis present

## 2019-06-22 DIAGNOSIS — R2681 Unsteadiness on feet: Secondary | ICD-10-CM | POA: Diagnosis present

## 2019-06-22 DIAGNOSIS — R41841 Cognitive communication deficit: Secondary | ICD-10-CM | POA: Diagnosis present

## 2019-06-22 NOTE — Therapy (Signed)
[facility] [address] [place], [place], [address] Phone: [phone]   Fax:  [phone]  Occupational Therapy Treatment  Patient Details  Name: Gregory Moreno MRN: [id number] Date of Birth: 23-Sep-1995 Referring Provider (OT): Marlowe Sh[place]   Encounter Date: 06/22/2019  OT End of Session - 06/22/19 [place]    Visit Number  42    Number of Visits  46    Date for OT Re-Evaluation  07/13/19    Authorization Type  No VL - No auth required    OT Start Time  1503    OT Stop Time  1549    OT Time Calculation (min)  46 min    Activity Tolerance  Patient tolerated treatment well       History reviewed. No pertinent past medical history.  Past Surgical History:  Procedure Laterality Date  . CRANIOTOMY Left 10/13/2018   Procedure: LEFT CRANIOTOMY FOR TUMOR RESECTION;  Surgeon: Judith Part, MD;  Location: [facility];  Service: Neurosurgery;  Laterality: Left;    There were no vitals filed for this visit.  Subjective Assessment - 06/22/19 1827    Subjective   I went to the beach this weekend to see friends for the first time  (mom reports without parents)    Patient is accompanied by:  Family member   mom   Pertinent History  Glioblastoma    Currently in Pain?  No/denies       Patient seen for aquatic therapy today.  Treatment took place in water 2.5-4 feet deep depending upon activity.  Pt entered the pool via ramp using 1 hand railing and min- moderate facilitation for postural alignment and control.  Transitioned pt into supine using floatation devices.  Focused on reducing spasticity in RUE as well as RLE and used aqua stretch techniques to address ROM for RUE and RLE prior to moving to demand for more active participation of R side. Pt today able to assist with shoulder hyperextension with elbow straight as well as shoulder flexion (assisted by buoyancy). Pt also able to actively initiate and complete active assisted  shoulder abduction to approximately 110* with therapy assistance for elbow extension.  Pt had more difficulty with active adduction of RUE however with repetition was able to intermittently initiate movement.  Also addressed active RLE ab/adduction with significant improvement in quality, range, direction and efffort today ( 10 reps x2 with no facilitation).  Also addressed hip and knee extension in supine using water buoyancy for resistance - today pt able to maintain knee extension without assistance (10 reps x2).  Also addressed knee flexion/extension -pt able to complete knee extension with cues only (10 reps x2) however had difficult with knee flexion in supine due to apraxia, decreased sensation - pt required mod facilitation and mod assist.  Transitioned into standing addressed weight shifting to R into single leg stance as well as single leg squats using only RUE and bilateral hand held assist. Initially pt had significant difficulty with balance for this activity as well as squats however performance improved with repetition.  Addressed functional ambulation first with bilateral UE support, progressing to RUE as support only and then progressing to no UE support (however with min a and moderate facilitation for postural alignment and control). Pt with improving ability to control R hip relative R knee and foot for alignment to avoid knee hyperextension and encourage active knee and hip extension during stance.  Pt exited the pool via the ramp and  one railing with min a and moderate facilitation. Pt overall with improving postural alignment and control, improved balance during functional mobility and slowly decreasing reliance on UEs when in the pool.                        OT Short Term Goals - 06/08/19 1810      OT SHORT TERM GOAL #1   Title  Patient will complete an HEP designed to improve active motion in RUE due 01/16/19    Status  Achieved      OT SHORT TERM GOAL #2   Title   Patient will reach forward with shoulder flexion, elbow extension pattern to make contact with target in midline and 8-12' from chest with intermittent assist    Status  Achieved      OT SHORT TERM GOAL #3   Title  Patient will transfer from wheelchair to commode with no more than supervision assistance - transferring either toward right or toward left side    Status  Achieved      OT SHORT TERM GOAL #4   Title  Patient will grasp and release a cylindrical object 2-3" in diameter with min assist    Status  Achieved      OT SHORT TERM GOAL #5   Title  Patient will dress lower body with mod assist    Status  Achieved      OT SHORT TERM GOAL #6   Title  Patient will don/doff underwear, pants, and left shoe with min assist    Status  Achieved      OT SHORT TERM GOAL #7   Title  Patient will complete shower transfer with supervision    Status  Achieved      OT SHORT TERM GOAL #8   Title  Patient will bathe self in shower with supervision and cueing assistance    Status  Achieved        OT Long Term Goals - 06/08/19 1811      OT LONG TERM GOAL #1   Title  Patient will dress himself with no greater than set up assistance - 05/15/19    Status  Achieved      OT LONG TERM GOAL #2   Title  Patient will shower with supervision    Status  Achieved      OT LONG TERM GOAL #3   Title  Patient will cut food on plate with modified independence    Status  Achieved      OT LONG TERM GOAL #4   Title  Patient will demonstrate ability to retrieve a lightweight item (less than 2lb) from waist height or lower and transport 6-12 inches laterally with RUE    Status  Deferred      OT LONG TERM GOAL #5   Title  Patient will demonstrate RUE  mid level reach with minimal LUE support to hit a target at chest height directly in front of body    Status  Deferred      OT LONG TERM GOAL #6   Title  Patient will demonstrate improved postural alignment and control with functional mobility to reduce  fall risk and spasticity in RUE and RLE. - 07/13/2019    Status  On-going      OT LONG TERM GOAL #7   Title  Pt and family will demonstrate understanding of aquatic based HEP to address balance, spasticity mgmt, LE strengthening  Status  On-going            Plan - 06/22/19 1828    Clinical Impression Statement  Pt continues to make progress toward goals and is demonstrating some carry over for postural alignment and control from session to session as well as to functional ambulation on land    OT Occupational Profile and History  Comprehensive Assessment- Review of records and extensive additional review of physical, cognitive, psychosocial history related to current functional performance    Body Structure / Function / Physical Skills  ADL;Decreased knowledge of use of DME;Gait;Strength;Balance;Dexterity;GMC;Tone;Body mechanics;Hearing;Proprioception;UE functional use;Endurance;IADL;ROM;Vestibular;Vision;Coordination;Flexibility;Mobility;Sensation;FMC;Decreased knowledge of precautions    Cognitive Skills  Attention;Problem Solve;Emotional;Safety Awareness;Sequencing;Memory;Learn;Thought;Understand;Perception    Psychosocial Skills  Routines and Behaviors;Interpersonal Interaction    Rehab Potential  Good    Clinical Decision Making  Multiple treatment options, significant modification of task necessary    Comorbidities Affecting Occupational Performance:  May have comorbidities impacting occupational performance    Modification or Assistance to Complete Evaluation   Min-Moderate modification of tasks or assist with assess necessary to complete eval    OT Duration  8 weeks    OT Treatment/Interventions  Self-care/ADL training;DME and/or AE instruction;Splinting;Balance training;Aquatic Therapy;Therapeutic activities;Therapeutic exercise;Cognitive remediation/compensation;Neuromuscular education;Functional Mobility Training;Visual/perceptual remediation/compensation;Electrical  Stimulation;Manual Therapy;Patient/family education    Plan  aquatic therapy to address postural alignment and control, incorporation and activation of R side to decrease spasitcity and reduce fall risk    Consulted and Agree with Plan of Care  Patient;Family member/caregiver    Family Member Consulted  mom       Patient will benefit from skilled therapeutic intervention in order to improve the following deficits and impairments:   Body Structure / Function / Physical Skills: ADL, Decreased knowledge of use of DME, Gait, Strength, Balance, Dexterity, GMC, Tone, Body mechanics, Hearing, Proprioception, UE functional use, Endurance, IADL, ROM, Vestibular, Vision, Coordination, Flexibility, Mobility, Sensation, FMC, Decreased knowledge of precautions Cognitive Skills: Attention, Problem Solve, Emotional, Safety Awareness, Sequencing, Memory, Learn, Thought, Understand, Perception Psychosocial Skills: Routines and Behaviors, Interpersonal Interaction   Visit Diagnosis: Muscle weakness (generalized)  Hemiplegia affecting right dominant side, unspecified etiology, unspecified hemiplegia type ([place])  Other symptoms and signs involving the nervous system  Unsteadiness on feet  Apraxia  Visuospatial deficit    Problem List Patient Active Problem List   Diagnosis Date Noted  . Goals of care, counseling/discussion 12/16/2018  . Palliative care by specialist   . 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 Spastic hemiparesis ([place])   . Cerebral edema (HCC)   . Intracranial tumor ([place])   . Steroid-induced hyperglycemia   . Spastic hemiplegia affecting nondominant side ([place])   . Hyponatremia   . Transaminitis   . Leucocytosis   . Right spastic hemiplegia ([place]) 10/21/2018  . Dysphagia 10/21/2018  . 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 Brain mass   . ICH (intracerebral hemorrhage) ([place]) 10/13/2018    Quay Burow, OTR/L 06/22/2019, 6:31 PM  [facility] [address] [place], [place], [address] Phone: [phone]   Fax:  [phone]  Name: FIORE DETJEN MRN: [id number] Date of Birth: 05-24-1995

## 2019-06-24 ENCOUNTER — Encounter: Payer: Self-pay | Admitting: Physical Therapy

## 2019-06-24 ENCOUNTER — Ambulatory Visit: Payer: 59 | Admitting: Physical Therapy

## 2019-06-24 ENCOUNTER — Other Ambulatory Visit: Payer: Self-pay

## 2019-06-24 DIAGNOSIS — R2681 Unsteadiness on feet: Secondary | ICD-10-CM

## 2019-06-24 DIAGNOSIS — R29818 Other symptoms and signs involving the nervous system: Secondary | ICD-10-CM

## 2019-06-24 DIAGNOSIS — M6281 Muscle weakness (generalized): Secondary | ICD-10-CM | POA: Diagnosis not present

## 2019-06-24 DIAGNOSIS — G8191 Hemiplegia, unspecified affecting right dominant side: Secondary | ICD-10-CM

## 2019-06-25 NOTE — Therapy (Signed)
[facility] [address] [place], [place], [address] Phone: [phone]   Fax:  [phone]  Physical Therapy Treatment  Patient Details  Name: Gregory Moreno MRN: [id number] Date of Birth: [birth year] Referring Provider (PT): Alger Simons, MD   Encounter Date: 06/24/2019   PT End of Session - 06/25/19 1602    Visit Number 42    Number of Visits 26    Date for PT Re-Evaluation 08/12/19    Authorization Type [employer] - 2021, no VL, $40 copay per day (regardless of how many visits)    PT Start Time 1315    PT Stop Time 1400    PT Time Calculation (min) 45 min    Equipment Utilized During Treatment Gait belt    Activity Tolerance Patient tolerated treatment well    Behavior During Therapy [facility] for tasks assessed/performed           History reviewed. No pertinent past medical history.  Past Surgical History:  Procedure Laterality Date  . CRANIOTOMY Left 10/13/2018   Procedure: LEFT CRANIOTOMY FOR TUMOR RESECTION;  Surgeon: Judith Part, MD;  Location: [facility];  Service: Neurosurgery;  Laterality: Left;    There were no vitals filed for this visit.   Subjective Assessment - 06/24/19 1318    Subjective Went to [facility] this past week. The pool on Monday went well. Walked a lap around the pool.    Patient is accompained by: Family member    How long can you walk comfortably? 100' with hemiwalker    Patient Stated Goals wants to improve his walking, get back to normal    Currently in Pain? No/denies                             [facility] Adult PT Treatment/Exercise - 06/25/19 0001      High Level Balance   High Level Balance Comments Standing with LUE on chair: with assist of PT tech rolling soccer ball x10 reps kicking with RLE for hip/knee flexion activation, x10 reps kicking with LLE for SLS on RLE with verbal and tactile cues for quad/glute activation before kicking ball. X10 reps  alternating kicking between RLE and LLE for weight shift and coordination, min A for balance       Neuro Re-ed    Neuro Re-ed Details  From sit > stand and then turning to face mat table, using LUE to help lower down into tall kneeling, with demo cues for sequencing, then from half kneel > tall kneel. In tall kneeling position with green theraband around distal thighs x10 reps side steps with RLE for R hip ABD strengthening, x10 reps side steps with LLE for closed chain hip ABD strengthening and weight shift to R, with therapist assist for proper alignment of pelvis. With assist of PT tech providing resistance with green theraband in tall kneeling 2 x 10 reps tall kneel mini squats with cues for hip extension activation and core stability, progressing to no UE support on mat. With single UE support, transitioned to half kneel, with pt needing verbal and demo cues for proper technique and to get into position, when in tall kneel use of mirror for postural alignment and then bouts of no UE support for balance with cues for glute and trunk extensor activation, needing min A for balance at times.  PT Education - 06/25/19 1601    Education Details discussed POC going forward, with aquatics more than likely ending at end of June (but primary PT to check if pt can continue with PT in pool due to pt's incr interest)    Person(s) Educated Patient;Parent(s)    Methods Explanation    Comprehension Verbalized understanding            PT Short Term Goals - 06/11/19 1654      PT SHORT TERM GOAL #1   Title Pt will undergo further assessment of gait speed with no AD - LTG to be written as appropriate. ALL STGS DUE 07/09/19    Time 4    Period Weeks    Status New    Target Date 07/09/19      PT SHORT TERM GOAL #2   Title Pt will undergo further assessment of TUG with no AD in order to determine fall risk - LTG to be written as appropriate    Time 4    Period Weeks    Status New        PT SHORT TERM GOAL #3   Title Pt will improve gait speed with small base quad cane to at least 2.1 ft/sec in order to improve community mobility.    Baseline 1.87 ft/sec    Time 4    Period Weeks    Status New      PT SHORT TERM GOAL #4   Title Pt will perform a curb with supervision with no AD in order to improve community mobility and independence    Time 4    Period Weeks    Status New      PT SHORT TERM GOAL #5   Title Pt will ambulate at least 100' outdoors on grass with min guard with no AD in order to improve functional mobility while ambulating outdoors with family.    Baseline min A at times for balance    Time 4    Period Weeks             PT Long Term Goals - 06/11/19 1657      PT LONG TERM GOAL #1   Title Gait speed goal to be written as appropriate with no AD. ALL LTGS DUE 08/10/19    Baseline 1.87 ft/sec with small base quad cane    Time 8    Period Weeks    Status New    Target Date 08/10/19      PT LONG TERM GOAL #2   Title Pt will ambulate at least 200' over outdoor paved/gravel/mulch surfaces with supervision in order to improve functional mobility with no AD while camping/going on walks with family.    Baseline supervision over indoor suraces, min guard/min A over outdoor surfaces with no AD    Time 8    Period Weeks    Status New      PT LONG TERM GOAL #3   Title Pt will perform a curb with MOD I with no AD in order to improve community mobility and independence.    Baseline min guard with no AD    Time 8    Period Weeks    Status Revised      PT LONG TERM GOAL #4   Title TUG goal to be written as appropriate to determine fall risk.    Time 8    Period Weeks    Status New      PT LONG TERM  GOAL #5   Title Patient will perform at least 4 steps using single handrail and step to pattern with supervision in order to safely enter/exit the RV.    Baseline 4 steps with single handrail and step to pattern - needs min A at times, overwise min  guard on steps to clear RLE due to lip on step, min guard/supervision while descending    Time 8    Period Weeks    Status Revised                 Plan - 06/25/19 1604    Clinical Impression Statement Focus of today's skilled session was standing dynamic balance with focus on SLS and strengthening/weight shifting in tall and half kneeling. Pt needing min A for stand > tall kneeling for balance and cues for proper technique with use of LUE support. Pt challenged by balancing in half kneeling with LLE anteriorly, needing min A for balance and tactile cues for glute activation for balance. Will continue to progress towards LTGs.    Personal Factors and Comorbidities --   pt ungergoing chemo/radiation   Examination-Activity Limitations Bed Mobility;Locomotion Level;Sit;Squat;Stairs;Stand;Transfers    Examination-Participation Restrictions Community Activity;School;Driving    Stability/Clinical Decision Making Evolving/Moderate complexity    Rehab Potential Good    PT Frequency 2x / week    PT Duration 8 weeks    PT Treatment/Interventions ADLs/Self Care Home Management;Therapeutic exercise;Therapeutic activities;Functional mobility training;Stair training;Gait training;DME Instruction;Balance training;Neuromuscular re-education;Patient/family education;Orthotic Fit/Training;Wheelchair mobility training;Energy conservation;Passive range of motion    PT Next Visit Plan any word with aquatics? continue gait outdoors over unlevel surfaces/obstacles/curb and stairs with no AD. Needs to work on closed chain glute med activation on R.  Half kneeling/tall kneeling for core/hip strengthening and balance. .    PT Home Exercise Plan [email or web address]    Consulted and Agree with Plan of Care Patient;Family member/caregiver    Family Member Consulted Mom           Patient will benefit from skilled therapeutic intervention in order to improve the following deficits and impairments:  Abnormal gait, Decreased  activity tolerance, Decreased balance, Decreased cognition, Decreased coordination, Decreased safety awareness, Decreased range of motion, Decreased mobility, Difficulty walking, Decreased strength, Decreased endurance, Impaired tone  Visit Diagnosis: Muscle weakness (generalized)  Hemiplegia affecting right dominant side, unspecified etiology, unspecified hemiplegia type (HCC)  Other symptoms and signs involving the nervous system  Unsteadiness on feet     Problem List Patient Active Problem List   Diagnosis Date Noted  . Goals of care, counseling/discussion 12/16/2018  . Palliative care by specialist   . 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 Spastic hemiparesis ([facility])   . Cerebral edema (HCC)   . Intracranial tumor ([place])   . Steroid-induced hyperglycemia   . Spastic hemiplegia affecting nondominant side ([place])   . Hyponatremia   . Transaminitis   . Leucocytosis   . Right spastic hemiplegia ([facility]) 10/21/2018  . Dysphagia 10/21/2018  . 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 Brain mass   . ICH (intracerebral hemorrhage) ([place]) 10/13/2018    Arliss Journey , PT, DPT  06/25/2019, 4:16 PM  [facility] [address] [place], [place], [address] Phone: [phone]   Fax:  [phone]  Name: Gregory Moreno MRN: [id number] Date of Birth: [birth year]

## 2019-06-29 ENCOUNTER — Ambulatory Visit: Payer: 59 | Admitting: Physical Therapy

## 2019-06-29 ENCOUNTER — Encounter: Payer: Self-pay | Admitting: Occupational Therapy

## 2019-06-29 ENCOUNTER — Encounter: Payer: Self-pay | Admitting: Physical Therapy

## 2019-06-29 ENCOUNTER — Other Ambulatory Visit: Payer: Self-pay

## 2019-06-29 ENCOUNTER — Ambulatory Visit: Payer: 59 | Admitting: Occupational Therapy

## 2019-06-29 DIAGNOSIS — R29818 Other symptoms and signs involving the nervous system: Secondary | ICD-10-CM

## 2019-06-29 DIAGNOSIS — M6281 Muscle weakness (generalized): Secondary | ICD-10-CM

## 2019-06-29 DIAGNOSIS — R41842 Visuospatial deficit: Secondary | ICD-10-CM

## 2019-06-29 DIAGNOSIS — R2681 Unsteadiness on feet: Secondary | ICD-10-CM

## 2019-06-29 DIAGNOSIS — R2689 Other abnormalities of gait and mobility: Secondary | ICD-10-CM

## 2019-06-29 DIAGNOSIS — R482 Apraxia: Secondary | ICD-10-CM

## 2019-06-29 DIAGNOSIS — G8191 Hemiplegia, unspecified affecting right dominant side: Secondary | ICD-10-CM

## 2019-06-29 NOTE — Therapy (Signed)
[facility] [address] [place], [place], [address] Phone: [phone]   Fax:  [phone]  Occupational Therapy Treatment  Patient Details  Name: Gregory Moreno MRN: [id number] Date of Birth: August 26, 1995 Referring Provider (OT): Marlowe [place]   Encounter Date: 06/29/2019   OT End of Session - 06/29/19 1834    Visit Number 43    Number of Visits 46    Date for OT Re-Evaluation 07/13/19    Authorization Type No VL - No auth required    OT Start Time 1503    OT Stop Time 1546    OT Time Calculation (min) 43 min    Activity Tolerance Patient tolerated treatment well           History reviewed. No pertinent past medical history.  Past Surgical History:  Procedure Laterality Date  . CRANIOTOMY Left 10/13/2018   Procedure: LEFT CRANIOTOMY FOR TUMOR RESECTION;  Surgeon: Judith Part, MD;  Location: [facility];  Service: Neurosurgery;  Laterality: Left;    There were no vitals filed for this visit.   Subjective Assessment - 06/29/19 1800    Subjective  I am doing pretty good    Patient is accompanied by: Family member   mom   Pertinent History Glioblastoma    Currently in Pain? No/denies           Patient seen for aquatic therapy today.  Treatment took place in water 2.5-4 feet deep depending upon activity.  Pt entered the pool via steps using 1 hand railing and min guard, cues for sequencing and safety.  Addressed crouched walking with hand held assist length of pool x2 to increase activation of RLE, increase attention to RLE, address postural alignment and control, and improve step length.  Transitioned into supine using floatation devices and utilized aqua stretch techniques to improve hip extension and hip abduction prior to therapeutic exercise. Pt today able to complete 10 reps x2 of hip extension and hip abduction with knee extension using floatation cuff on upper thigh (for shorter lever arm) to introduce  increased resistance for first time.  Transitioned into kneeling on pool floor in front of bench to utilize buoyancy and weightlessness in order to address moving from sitting on heels to tall kneeling with focus on hip extension. Initially pt only activating intact side therefore progressed pt to weight bearing on RLE in squatted kneeling with LLE "floating" off pool floor then facilitating and cueing pt to come into tall kneeling using RLE hip extension only. After several repetitions pt able to initiate and then able to complete full ROM into high kneeling. Transitioned into standing and facilitated carry over of active hip extension into stance during functional ambulation with moderate facilitation in open water with single UE support. With this facilitation pt able to demonstrate improved alignment over RLE during stance phase, demonstrate improved hip and knee control and improved weight shift onto RLE during stance with more normalized step length and overall decreased spasticity in RUE and improved trunk activation.  Pt exited the pool via steps using 1 hand railing, cues and intermittent min guard.                            OT Short Term Goals - 06/08/19 1810      OT SHORT TERM GOAL #1   Title Patient will complete an HEP designed to improve active motion in RUE due 01/16/19  Status Achieved      OT SHORT TERM GOAL #2   Title Patient will reach forward with shoulder flexion, elbow extension pattern to make contact with target in midline and 8-12' from chest with intermittent assist    Status Achieved      OT SHORT TERM GOAL #3   Title Patient will transfer from wheelchair to commode with no more than supervision assistance - transferring either toward right or toward left side    Status Achieved      OT SHORT TERM GOAL #4   Title Patient will grasp and release a cylindrical object 2-3" in diameter with min assist    Status Achieved      OT SHORT TERM GOAL #5    Title Patient will dress lower body with mod assist    Status Achieved      OT SHORT TERM GOAL #6   Title Patient will don/doff underwear, pants, and left shoe with min assist    Status Achieved      OT SHORT TERM GOAL #7   Title Patient will complete shower transfer with supervision    Status Achieved      OT [place] #8   Title Patient will bathe self in shower with supervision and cueing assistance    Status Achieved             OT Long Term Goals - 06/29/19 1801      OT LONG TERM GOAL #1   Title Patient will dress himself with no greater than set up assistance - 05/15/19    Status Achieved      OT LONG TERM GOAL #2   Title Patient will shower with supervision    Status Achieved      OT LONG TERM GOAL #3   Title Patient will cut food on plate with modified independence    Status Achieved      OT LONG TERM GOAL #4   Title Patient will demonstrate ability to retrieve a lightweight item (less than 2lb) from waist height or lower and transport 6-12 inches laterally with RUE    Status Deferred      OT LONG TERM GOAL #5   Title Patient will demonstrate RUE  mid level reach with minimal LUE support to hit a target at chest height directly in front of body    Status Deferred      OT LONG TERM GOAL #6   Title Patient will demonstrate improved postural alignment and control with functional mobility to reduce fall risk and spasticity in RUE and RLE. - 07/13/2019    Status On-going      OT LONG TERM GOAL #7   Title Pt and family will demonstrate understanding of aquatic based HEP to address balance, spasticity mgmt, LE strengthening    Status On-going                 Plan - 06/29/19 1801    Clinical Impression Statement Pt with slowly improving postural alignemnt and control for functional mobility    OT Occupational Profile and History Comprehensive Assessment- Review of records and extensive additional review of physical, cognitive, psychosocial history  related to current functional performance    Body Structure / Function / Physical Skills ADL;Decreased knowledge of use of DME;Gait;Strength;Balance;Dexterity;GMC;Tone;Body mechanics;Hearing;Proprioception;UE functional use;Endurance;IADL;ROM;Vestibular;Vision;Coordination;Flexibility;Mobility;Sensation;FMC;Decreased knowledge of precautions    Cognitive Skills Attention;Problem Solve;Emotional;Safety Awareness;Sequencing;Memory;Learn;Thought;Understand;Perception    Psychosocial Skills Routines and Behaviors;Interpersonal Interaction    Clinical Decision Making Multiple treatment options, significant  modification of task necessary    Comorbidities Affecting Occupational Performance: May have comorbidities impacting occupational performance    Modification or Assistance to Complete Evaluation  Min-Moderate modification of tasks or assist with assess necessary to complete eval    OT Frequency 1x / week    OT Duration 8 weeks    OT Treatment/Interventions Self-care/ADL training;DME and/or AE instruction;Splinting;Balance training;Aquatic Therapy;Therapeutic activities;Therapeutic exercise;Cognitive remediation/compensation;Neuromuscular education;Functional Mobility Training;Visual/perceptual remediation/compensation;Electrical Stimulation;Manual Therapy;Patient/family education    Plan aquatic therapy to address postural alignment and control, incorporation and activation of R side to decrease spasitcity and reduce fall risk    Consulted and Agree with Plan of Care Patient;Family member/caregiver    Family Member Consulted mom           Patient will benefit from skilled therapeutic intervention in order to improve the following deficits and impairments:   Body Structure / Function / Physical Skills: ADL, Decreased knowledge of use of DME, Gait, Strength, Balance, Dexterity, GMC, Tone, Body mechanics, Hearing, Proprioception, UE functional use, Endurance, IADL, ROM, Vestibular, Vision, Coordination,  Flexibility, Mobility, Sensation, FMC, Decreased knowledge of precautions Cognitive Skills: Attention, Problem Solve, Emotional, Safety Awareness, Sequencing, Memory, Learn, Thought, Understand, Perception Psychosocial Skills: Routines and Behaviors, Interpersonal Interaction   Visit Diagnosis: Muscle weakness (generalized)  Hemiplegia affecting right dominant side, unspecified etiology, unspecified hemiplegia type ([place])  Other symptoms and signs involving the nervous system  Unsteadiness on feet  Apraxia  Visuospatial deficit    Problem List Patient Active Problem List   Diagnosis Date Noted  . Goals of care, counseling/discussion 12/16/2018  . Palliative care by specialist   . 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 Spastic hemiparesis ([place])   . Cerebral edema (HCC)   . Intracranial tumor ([place])   . Steroid-induced hyperglycemia   . Spastic hemiplegia affecting nondominant side ([place])   . Hyponatremia   . Transaminitis   . Leucocytosis   . Right spastic hemiplegia ([place]) 10/21/2018  . Dysphagia 10/21/2018  . 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 Brain mass   . ICH (intracerebral hemorrhage) ([place]) 10/13/2018    Quay Burow , OTR/L 06/29/2019, 6:35 PM  [facility] [address] [place], [place], [address] Phone: [phone]   Fax:  [phone]  Name: BRAESON RUPE MRN: [id number] Date of Birth: 1995-09-19

## 2019-06-30 ENCOUNTER — Inpatient Hospital Stay: Payer: 59 | Attending: Internal Medicine | Admitting: Internal Medicine

## 2019-06-30 ENCOUNTER — Inpatient Hospital Stay: Payer: 59

## 2019-06-30 ENCOUNTER — Other Ambulatory Visit: Payer: Self-pay

## 2019-06-30 VITALS — BP 132/73 | HR 75 | Temp 97.7°F | Resp 18 | Ht 69.0 in | Wt 186.0 lb

## 2019-06-30 DIAGNOSIS — Z9221 Personal history of antineoplastic chemotherapy: Secondary | ICD-10-CM | POA: Insufficient documentation

## 2019-06-30 DIAGNOSIS — Z79899 Other long term (current) drug therapy: Secondary | ICD-10-CM | POA: Insufficient documentation

## 2019-06-30 DIAGNOSIS — C712 Malignant neoplasm of temporal lobe: Secondary | ICD-10-CM

## 2019-06-30 LAB — CBC WITH DIFFERENTIAL (CANCER CENTER ONLY)
Abs Immature Granulocytes: 0.03 10*3/uL (ref 0.00–0.07)
Basophils Absolute: 0 10*3/uL (ref 0.0–0.1)
Basophils Relative: 0 %
Eosinophils Absolute: 0.1 10*3/uL (ref 0.0–0.5)
Eosinophils Relative: 1 %
HCT: 39 % (ref 39.0–52.0)
Hemoglobin: 12.8 g/dL — ABNORMAL LOW (ref 13.0–17.0)
Immature Granulocytes: 1 %
Lymphocytes Relative: 14 %
Lymphs Abs: 0.6 10*3/uL — ABNORMAL LOW (ref 0.7–4.0)
MCH: 30.3 pg (ref 26.0–34.0)
MCHC: 32.8 g/dL (ref 30.0–36.0)
MCV: 92.4 fL (ref 80.0–100.0)
Monocytes Absolute: 0.3 10*3/uL (ref 0.1–1.0)
Monocytes Relative: 8 %
Neutro Abs: 3.4 10*3/uL (ref 1.7–7.7)
Neutrophils Relative %: 76 %
Platelet Count: 187 10*3/uL (ref 150–400)
RBC: 4.22 MIL/uL (ref 4.22–5.81)
RDW: 15.4 % (ref 11.5–15.5)
WBC Count: 4.5 10*3/uL (ref 4.0–10.5)
nRBC: 0 % (ref 0.0–0.2)

## 2019-06-30 LAB — CMP (CANCER CENTER ONLY)
ALT: 12 U/L (ref 0–44)
AST: 14 U/L — ABNORMAL LOW (ref 15–41)
Albumin: 3.8 g/dL (ref 3.5–5.0)
Alkaline Phosphatase: 77 U/L (ref 38–126)
Anion gap: 10 (ref 5–15)
BUN: 8 mg/dL (ref 6–20)
CO2: 26 mmol/L (ref 22–32)
Calcium: 9.8 mg/dL (ref 8.9–10.3)
Chloride: 105 mmol/L (ref 98–111)
Creatinine: 0.87 mg/dL (ref 0.61–1.24)
GFR, Est AFR Am: >60 mL/min (ref >60)
GFR, Estimated: >60 mL/min (ref >60)
Glucose, Bld: 81 mg/dL (ref 70–99)
Potassium: 3.9 mmol/L (ref 3.5–5.1)
Sodium: 141 mmol/L (ref 135–145)
Total Bilirubin: 0.3 mg/dL (ref 0.3–1.2)
Total Protein: 8.1 g/dL (ref 6.5–8.1)

## 2019-06-30 MED ORDER — TEMOZOLOMIDE 100 MG PO CAPS: 150.0000 mg/m2/d | ORAL_CAPSULE | Freq: Every day | ORAL | 0 refills | Status: DC

## 2019-06-30 NOTE — Therapy (Signed)
[facility] [address] [place], [place], [address] Phone: [phone]   Fax:  [phone]  Physical Therapy Treatment  Patient Details  Name: Gregory Moreno MRN: [id number] Date of Birth: 05-14-1995 Referring Provider (PT): Alger Simons, MD   Encounter Date: 06/29/2019   PT End of Session - 06/30/19 0910    Visit Number 43    Number of Visits 57    Date for PT Re-Evaluation 08/12/19    Authorization Type [employer] - 2021, no VL, $40 copay per day (regardless of how many visits)    PT Start Time 1317    PT Stop Time 1359    PT Time Calculation (min) 42 min    Equipment Utilized During Treatment Gait belt    Activity Tolerance Patient tolerated treatment well    Behavior During Therapy [facility] for tasks assessed/performed           History reviewed. No pertinent past medical history.  Past Surgical History:  Procedure Laterality Date  . CRANIOTOMY Left 10/13/2018   Procedure: LEFT CRANIOTOMY FOR TUMOR RESECTION;  Surgeon: Judith Part, MD;  Location: [facility];  Service: Neurosurgery;  Laterality: Left;    There were no vitals filed for this visit.   Subjective Assessment - 06/29/19 1319    Subjective Went up stairs with a reciprocal pattern over the weekend.    Patient is accompained by: Family member    How long can you walk comfortably? 100' with hemiwalker    Patient Stated Goals wants to improve his walking, get back to normal    Currently in Pain? No/denies                             [facility] Adult PT Treatment/Exercise - 06/30/19 0001      Ambulation/Gait   Ambulation/Gait Yes    Ambulation/Gait Assistance 4: Min guard;4: Min assist    Ambulation/Gait Assistance Details no AD in clinic and outdoors - needing facilitation through R pelvis for incr stance time and glute med activation with cues for longer step length with LLE, pt with improved knee control during gait today      Ambulation Distance (Feet) 250 Feet   approx. indoors and outdoors   Assistive device None    Gait Pattern Step-through pattern;Decreased hip/knee flexion - right;Decreased dorsiflexion - right;Right genu recurvatum;Poor foot clearance - right    Ambulation Surface Level;Indoor    Stairs Yes    Stairs Assistance 4: Min guard;4: Min assist    Stairs Assistance Details (indicate cue type and reason) pt ascending with step over step with min A at times for RLE foot clearance and for incr hip/knee flexion, descending as well with step over step with initial min A for forward weight shifting over RLE when descending, pt with improved forward translation with incr reps     Stair Management Technique Alternating pattern;One rail Left;Forwards    Number of Stairs 12    Height of Stairs 6    Curb 5: Supervision;Other (comment)   min guard   Curb Details (indicate cue type and reason) needing initial cues for proper technique, performed x5 reps descending (progressing from min guard to supervision) and then ambulating approx. 26' with no AD and ascending curb with min guard, pt needing cues to keep forward momentum initially and for incr hip/knee flexion for RLE foot clearance due to first initial reps with toe scuffing when trying to clear  RLE, pt with no LOB      Neuro Re-ed    Neuro Re-ed Details  standing with single UE support: hip/knee flexion onto 4" step x10 reps with min A when picking RLE off of step. In // bars: on blue compliant mat with no UE support, multi directional reaching with LLE towards R for weight shifting and performing mini squats to reach for 15 beanbags (from PT tech, therapist providing min guard for balance and tactile cues for hip and R quad activation) and then tossing into basket - x2 reps. With single UE support and then progressing to fingertip support: lateral step overs a 2" beam with LLE for SLS and weight shifting on RLE x10 reps, min guard for balance with tactile cues for  extensor activation on RLE                     PT Short Term Goals - 06/11/19 1654      PT SHORT TERM GOAL #1   Title Pt will undergo further assessment of gait speed with no AD - LTG to be written as appropriate. ALL STGS DUE 07/09/19    Time 4    Period Weeks    Status New    Target Date 07/09/19      PT SHORT TERM GOAL #2   Title Pt will undergo further assessment of TUG with no AD in order to determine fall risk - LTG to be written as appropriate    Time 4    Period Weeks    Status New      PT SHORT TERM GOAL #3   Title Pt will improve gait speed with small base quad cane to at least 2.1 ft/sec in order to improve community mobility.    Baseline 1.87 ft/sec    Time 4    Period Weeks    Status New      PT SHORT TERM GOAL #4   Title Pt will perform a curb with supervision with no AD in order to improve community mobility and independence    Time 4    Period Weeks    Status New      PT SHORT TERM GOAL #5   Title Pt will ambulate at least 100' outdoors on grass with min guard with no AD in order to improve functional mobility while ambulating outdoors with family.    Baseline min A at times for balance    Time 4    Period Weeks             PT Long Term Goals - 06/11/19 1657      PT LONG TERM GOAL #1   Title Gait speed goal to be written as appropriate with no AD. ALL LTGS DUE 08/10/19    Baseline 1.87 ft/sec with small base quad cane    Time 8    Period Weeks    Status New    Target Date 08/10/19      PT LONG TERM GOAL #2   Title Pt will ambulate at least 200' over outdoor paved/gravel/mulch surfaces with supervision in order to improve functional mobility with no AD while camping/going on walks with family.    Baseline supervision over indoor suraces, min guard/min A over outdoor surfaces with no AD    Time 8    Period Weeks    Status New      PT LONG TERM GOAL #3   Title Pt will perform a curb with  MOD I with no AD in order to improve community  mobility and independence.    Baseline min guard with no AD    Time 8    Period Weeks    Status Revised      PT LONG TERM GOAL #4   Title TUG goal to be written as appropriate to determine fall risk.    Time 8    Period Weeks    Status New      PT LONG TERM GOAL #5   Title Patient will perform at least 4 steps using single handrail and step to pattern with supervision in order to safely enter/exit the RV.    Baseline 4 steps with single handrail and step to pattern - needs min A at times, overwise min guard on steps to clear RLE due to lip on step, min guard/supervision while descending    Time 8    Period Weeks    Status Revised                 Plan - 06/30/19 0911    Clinical Impression Statement Continued to ambulate throughout session with no AD and outdoors over paved surfaces with pt demonstrating improved R knee control. When fatigued, pt does have a couple instances of decr R foot clearance. Pt able to perform stairs today with single handrail with reciprocal pattern ascending (needing intermittent min A for R foot clearance) and descending with min A at times for forward weight shift over RLE when descendng, however pt did improve with incr reps. Will continue to progress towards LTGs.    Personal Factors and Comorbidities --   pt ungergoing chemo/radiation   Examination-Activity Limitations Bed Mobility;Locomotion Level;Sit;Squat;Stairs;Stand;Transfers    Examination-Participation Restrictions Community Activity;School;Driving    Stability/Clinical Decision Making Evolving/Moderate complexity    Rehab Potential Good    PT Frequency 2x / week    PT Duration 8 weeks    PT Treatment/Interventions ADLs/Self Care Home Management;Therapeutic exercise;Therapeutic activities;Functional mobility training;Stair training;Gait training;DME Instruction;Balance training;Neuromuscular re-education;Patient/family education;Orthotic Fit/Training;Wheelchair mobility training;Energy  conservation;Passive range of motion    PT Next Visit Plan continue gait outdoors over unlevel surfaces/obstacles/curb and stairs with no AD. Needs to work on closed chain glute med activation on R and hip ext.  Half kneeling/tall kneeling for core/hip strengthening and balance.    PT Home Exercise Plan [id number]    Consulted and Agree with Plan of Care Patient;Family member/caregiver    Family Member Consulted Mom           Patient will benefit from skilled therapeutic intervention in order to improve the following deficits and impairments:  Abnormal gait, Decreased activity tolerance, Decreased balance, Decreased cognition, Decreased coordination, Decreased safety awareness, Decreased range of motion, Decreased mobility, Difficulty walking, Decreased strength, Decreased endurance, Impaired tone  Visit Diagnosis: Muscle weakness (generalized)  Other symptoms and signs involving the nervous system  Unsteadiness on feet  Other abnormalities of gait and mobility     Problem List Patient Active Problem List   Diagnosis Date Noted  . Goals of care, counseling/discussion 12/16/2018  . Palliative care by specialist   . 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 Spastic hemiparesis ([place])   . Cerebral edema ([facility])   . Intracranial tumor ([place])   . Steroid-induced hyperglycemia   . Spastic hemiplegia affecting nondominant side ([place])   . Hyponatremia   . Transaminitis   . Leucocytosis   . Right spastic hemiplegia ([place]) 10/21/2018  . Dysphagia 10/21/2018  . 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 Brain mass   . ICH (intracerebral hemorrhage) ([place]) 10/13/2018    Arliss Journey, PT, DPT  06/30/2019, 9:25 AM  [facility] [address] [place], [place], [address] Phone: [phone]   Fax:  [phone]  Name: Gregory Moreno MRN: [id number] Date of Birth: 1995/08/03

## 2019-06-30 NOTE — Progress Notes (Signed)
[facility] [place], [place] [address] [phone]   Interval Evaluation  Date of Service: 06/30/19 Patient Name: Gregory Moreno Patient MRN: [id number] Patient DOB: [birth year] Provider: Ventura Sellers, MD  Identifying Statement:  [name] is a 24 y.o. male with left temporal glioblastoma   Referring Provider: [facility] [address] 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    02/09/2019 -  Chemotherapy   The patient had [No matching medication found in this treatment plan]  for chemotherapy treatment.      Biomarkers:  MGMT Unknown.  IDH 1/2 Wild type.  EGFR Unknown  TERT Unknown   Interval History:  [name] presents today for follow up following after completing cycle #4 adjuvant Temodar.  Right side strength has been stable, still ambulating consistently with a cane, and some without any assistance clinic today.  Remains with dense left visual field impairment.  Language continues to have expression limitations.  No issues with tolerating chemo.  Medications: Current Outpatient Medications on File Prior to Visit  Medication Sig Dispense Refill  . camphor-menthol (SARNA) lotion Apply 1 application topically as needed for itching. 222 mL 0  . escitalopram (LEXAPRO) 5 MG tablet TAKE 1 TABLET BY MOUTH EVERYDAY AT BEDTIME 90 tablet 1  . ondansetron (ZOFRAN) 4 MG tablet Take 1 tablet (4 mg total) by mouth every 8 (eight) hours as needed for nausea or vomiting. 30 tablet 1  . ondansetron (ZOFRAN) 8 MG tablet TAKE 1 TABLET BY MOUTH TWICE DAILY AS NEEDED FOR NAUSEA AND VOMITING. MAY  TAKE 30-60 MINUTES PRIOR TO TEMODAR ADMINISTRATION IF NAUSEA/VOMITING OCCURS. 30 tablet 0  . temozolomide (TEMODAR) 100 MG capsule Take 4 capsules (400 mg total) by mouth daily. May take on an empty stomach to decrease nausea & vomiting. 20 capsule 0  . temozolomide (TEMODAR) 100 MG capsule Take 3 capsules (300 mg total) by mouth daily. May take on an empty stomach to decrease nausea & vomiting. 15 capsule 0  . temozolomide (TEMODAR) 100 MG capsule Take 3 capsules (300 mg total) by mouth daily. May take on an empty stomach to decrease nausea & vomiting. 15 capsule 0  . [DISCONTINUED] escitalopram (LEXAPRO) 5 MG tablet TAKE 1 TABLET BY MOUTH EVERYDAY AT BEDTIME 30 tablet 0   No current facility-administered medications on file prior to visit.    Allergies: No Known Allergies Past Medical History: No past medical history on file. Past Surgical History:  Past Surgical History:  Procedure Laterality Date  . CRANIOTOMY Left 10/13/2018   Procedure: LEFT CRANIOTOMY FOR TUMOR RESECTION;  Surgeon: Judith Part, MD;  Location: [facility];  Service: Neurosurgery;  Laterality: Left;   Social History:  Social History   Socioeconomic History  . Marital status: Single    Spouse name: Not on file  . Number of children: Not on file  . Years of education: Not on file  . Highest education level: Not on file  Occupational History  . Not on file  Tobacco Use  . Smoking status: Never Smoker  . Smokeless tobacco: Never Used  Substance and Sexual Activity  . Alcohol use: Not on file  .  Drug use: Not on file  . Sexual activity: Not on file  Other Topics Concern  . Not on file  Social History Narrative  . Not on file   Social Determinants of Health   Financial Resource Strain:   . Difficulty of Paying Living Expenses:   Food Insecurity:   . Worried About Charity fundraiser in the Last Year:   . Arboriculturist in the Last Year:   Transportation Needs:   . Film/video editor (Medical):     Marland Kitchen Lack of Transportation (Non-Medical):   Physical Activity:   . Days of Exercise per Week:   . Minutes of Exercise per Session:   Stress:   . Feeling of Stress :   Social Connections:   . Frequency of Communication with Friends and Family:   . Frequency of Social Gatherings with Friends and Family:   . Attends Religious Services:   . Active Member of Clubs or Organizations:   . Attends Archivist Meetings:   Marland Kitchen Marital Status:   Intimate Partner Violence:   . Fear of Current or Ex-Partner:   . Emotionally Abused:   Marland Kitchen Physically Abused:   . Sexually Abused:    Family History: No family history on file.  Review of Systems: Constitutional: Denies fevers, chills or abnormal weight loss Eyes: Denies blurriness of vision Ears, nose, mouth, throat, and face: Denies mucositis or sore throat Respiratory: Denies cough, dyspnea or wheezes Cardiovascular: Denies palpitation, chest discomfort or lower extremity swelling Gastrointestinal:  Denies nausea, constipation, diarrhea GU: Denies dysuria or incontinence Skin: Denies abnormal skin rashes Neurological: Per HPI Musculoskeletal: Denies joint pain, back or neck discomfort. No decrease in ROM Behavioral/Psych: Denies anxiety, disturbance in thought content, and mood instability  Physical Exam: Vitals:   06/30/19 1145  BP: 132/73  Pulse: 75  Resp: 18  Temp: 97.7 F (36.5 C)  SpO2: 100%   KPS: 60. General: Alert, cooperative, pleasant, in no acute distress Head: Craniotomy scar noted, dry and intact. EENT: No conjunctival injection or scleral icterus. Oral mucosa moist Lungs: Resp effort normal Cardiac: Regular rate and rhythm Abdomen: Soft, non-distended abdomen Skin: No rashes cyanosis or petechiae. Extremities: No clubbing or edema  Neurologic Exam: Mental Status: Awake, alert, attentive to examiner. Oriented to self and environment. Language is impaired with regards to fluency.  Comprehension impaired to  multi-step complex commands.  Simple repetition is preserved. Cranial Nerves: Visual acuity is grossly normal. Dense R homonymous hemianopia. Extra-ocular movements intact. No ptosis. Face is symmetric, tongue midline. Motor: Tone and bulk are normal. Power is 3/5 in right arm and leg, 5/5 on left side. Reflexes are symmetric, no pathologic reflexes present. Sensory: Intact to light touch and temperature Gait: Dystaxic, hemiparetic  Labs: I have reviewed the data as listed    Component Value Date/Time   NA 142 06/02/2019 1129   K 4.0 06/02/2019 1129   CL 106 06/02/2019 1129   CO2 26 06/02/2019 1129   GLUCOSE 84 06/02/2019 1129   BUN 12 06/02/2019 1129   CREATININE 0.88 06/02/2019 1129   CALCIUM 9.5 06/02/2019 1129   PROT 7.7 06/02/2019 1129   ALBUMIN 3.6 06/02/2019 1129   AST 14 (L) 06/02/2019 1129   ALT 9 06/02/2019 1129   ALKPHOS 78 06/02/2019 1129   BILITOT 0.4 06/02/2019 1129   GFRNONAA >60 06/02/2019 1129   GFRAA >60 06/02/2019 1129   Lab Results  Component Value Date   WBC 4.5 06/30/2019   NEUTROABS  3.4 06/30/2019   HGB 12.8 (L) 06/30/2019   HCT 39.0 06/30/2019   MCV 92.4 06/30/2019   PLT 187 06/30/2019    Assessment/Plan 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   We recommended continuing treatment with cycle #5 Temozolomide 193m/m2, on for five days and off for twenty three days in twenty eight day cycles. The patient will have a complete blood count performed on days 21 and 28 of each cycle, and a comprehensive metabolic panel performed on day 28 of each cycle. Labs may need to be performed more often. Zofran will prescribed for home use for nausea/vomiting.   Chemotherapy should be held for the following:  ANC less than 1,000  Platelets less than 100,000  LFT or creatinine greater than 2x ULN  If clinical concerns/contraindications develop  Clinically, we continue to be  very pleased with his functional improvement through aggressive neuro-directed PT and OT.  We will follow up with him in 4 weeks prior to cycle #6 with MRI brain for review.  All questions were answered. The patient knows to call the clinic with any problems, questions or concerns. No barriers to learning were detected.  The total time spent in the encounter was 30 minutes and more than 50% was on counseling and review of test results   ZVentura Sellers MD Medical Director of Neuro-Oncology [facility] [place]06/15/21 11:41 AM

## 2019-07-01 ENCOUNTER — Telehealth: Payer: Self-pay | Admitting: Internal Medicine

## 2019-07-01 ENCOUNTER — Ambulatory Visit: Payer: 59 | Admitting: Physical Therapy

## 2019-07-01 ENCOUNTER — Encounter: Payer: Self-pay | Admitting: Physical Therapy

## 2019-07-01 DIAGNOSIS — M6281 Muscle weakness (generalized): Secondary | ICD-10-CM

## 2019-07-01 DIAGNOSIS — R29818 Other symptoms and signs involving the nervous system: Secondary | ICD-10-CM

## 2019-07-01 DIAGNOSIS — R2681 Unsteadiness on feet: Secondary | ICD-10-CM

## 2019-07-01 DIAGNOSIS — R2689 Other abnormalities of gait and mobility: Secondary | ICD-10-CM

## 2019-07-01 NOTE — Therapy (Signed)
[facility] [address] [place], [place], [address] Phone: [phone]   Fax:  [phone]  Physical Therapy Treatment  Patient Details  Name: LJ MIYAMOTO MRN: [id number] Date of Birth: [birth year] Referring Provider (PT): Alger Simons, MD   Encounter Date: 07/01/2019   PT End of Session - 07/01/19 1614    Visit Number 44    Number of Visits 57    Date for PT Re-Evaluation 08/12/19    Authorization Type [employer] - 2021, no VL, $40 copay per day (regardless of how many visits)    PT Start Time 1403    PT Stop Time 1445    PT Time Calculation (min) 42 min    Equipment Utilized During Treatment Gait belt    Activity Tolerance Patient tolerated treatment well    Behavior During Therapy [facility] for tasks assessed/performed           History reviewed. No pertinent past medical history.  Past Surgical History:  Procedure Laterality Date  . CRANIOTOMY Left 10/13/2018   Procedure: LEFT CRANIOTOMY FOR TUMOR RESECTION;  Surgeon: Judith Part, MD;  Location: [facility];  Service: Neurosurgery;  Laterality: Left;    There were no vitals filed for this visit.   Subjective Assessment - 07/01/19 1407    Subjective Platelets are up and are starting round 5 of chemo tomorrow.    Patient is accompained by: Family member    How long can you walk comfortably? 100' with hemiwalker    Patient Stated Goals wants to improve his walking, get back to normal    Currently in Pain? No/denies                             [facility] Adult PT Treatment/Exercise - 07/01/19 0001      Ambulation/Gait   Ambulation/Gait Yes    Ambulation/Gait Assistance 4: Min guard;4: Min assist    Ambulation/Gait Assistance Details clinic distances between activities with no AD, facilitation through R pelvis for R glute med activation and stance time for incr step length with LLE with no AD    Assistive device None    Gait Pattern  Step-through pattern;Decreased hip/knee flexion - right;Decreased dorsiflexion - right;Right genu recurvatum;Poor foot clearance - right    Ambulation Surface Level;Indoor    Stairs Yes    Stairs Assistance 4: Min guard;4: Min assist    Stairs Assistance Details (indicate cue type and reason) pt ascending with step over step with min A at times for RLE foot clearance and for incr hip/knee flexion, descending as well with step over step with initial min A for forward weight shifting over RLE when descending, pt with improved forward translation with incr reps, pt with tendency to get stuck at last step and perform with step to pattern when descending to floor, pt with more fluid pattern with incr reps.     Stair Management Technique Alternating pattern;One rail Left;Forwards    Number of Stairs 16    Height of Stairs 6      Neuro Re-ed    Neuro Re-ed Details  At bottom of staircase: beginning with single LUE support, SLS taps with LLE with RLE on 1" foam for SLS, tapping first step with R toes, tactile and verbal cues for R quad and hip activation during stance and with weight shifting, then progressing to no UE support and holding LLE on step and then performing 3 x 5  reps head turns R/L, then progressing to quick taps with LLE to step and back to floor with no UE support x10 reps. Standing next to countertop (using intermittent UE support with LLE as needed) with therapist providing min guard/min A, pt performing step to pattern while clearing 4 obstacles (1" beams and yard sticks), pt leading with RLE with focus on incr hip/knee flexion, pt unable to perform with leading with LLE due to RLE getting stuck on the obstacles due to decr foot clearance, multiple reps. Then performed side stepping over obstacles with manual facilitation for incr weight shift and cues for incr step length laterally, pt needing min A for RLE foot clearance when stepping towards R, however pt did improve with incr reps especially  over yardsticks, needing single UE support on counter.       Knee/Hip Exercises: Standing   Lateral Step Up Right;2 sets;10 reps;Hand Hold: 1    Lateral Step Up Limitations for closed chain R hip ABD strengthening, lifting and floating LLE, tactile and verbal cues for hip ABD activation                    PT Short Term Goals - 06/11/19 1654      PT SHORT TERM GOAL #1   Title Pt will undergo further assessment of gait speed with no AD - LTG to be written as appropriate. ALL STGS DUE 07/09/19    Time 4    Period Weeks    Status New    Target Date 07/09/19      PT SHORT TERM GOAL #2   Title Pt will undergo further assessment of TUG with no AD in order to determine fall risk - LTG to be written as appropriate    Time 4    Period Weeks    Status New      PT SHORT TERM GOAL #3   Title Pt will improve gait speed with small base quad cane to at least 2.1 ft/sec in order to improve community mobility.    Baseline 1.87 ft/sec    Time 4    Period Weeks    Status New      PT SHORT TERM GOAL #4   Title Pt will perform a curb with supervision with no AD in order to improve community mobility and independence    Time 4    Period Weeks    Status New      PT SHORT TERM GOAL #5   Title Pt will ambulate at least 100' outdoors on grass with min guard with no AD in order to improve functional mobility while ambulating outdoors with family.    Baseline min A at times for balance    Time 4    Period Weeks             PT Long Term Goals - 06/11/19 1657      PT LONG TERM GOAL #1   Title Gait speed goal to be written as appropriate with no AD. ALL LTGS DUE 08/10/19    Baseline 1.87 ft/sec with small base quad cane    Time 8    Period Weeks    Status New    Target Date 08/10/19      PT LONG TERM GOAL #2   Title Pt will ambulate at least 200' over outdoor paved/gravel/mulch surfaces with supervision in order to improve functional mobility with no AD while camping/going on walks  with family.    Baseline supervision  over indoor suraces, min guard/min A over outdoor surfaces with no AD    Time 8    Period Weeks    Status New      PT LONG TERM GOAL #3   Title Pt will perform a curb with MOD I with no AD in order to improve community mobility and independence.    Baseline min guard with no AD    Time 8    Period Weeks    Status Revised      PT LONG TERM GOAL #4   Title TUG goal to be written as appropriate to determine fall risk.    Time 8    Period Weeks    Status New      PT LONG TERM GOAL #5   Title Patient will perform at least 4 steps using single handrail and step to pattern with supervision in order to safely enter/exit the RV.    Baseline 4 steps with single handrail and step to pattern - needs min A at times, overwise min guard on steps to clear RLE due to lip on step, min guard/supervision while descending    Time 8    Period Weeks    Status Revised                 Plan - 07/01/19 1622    Clinical Impression Statement Continued to ambulate throughout session with no AD with manual facilitation through RLE for incr step length with LLE. Pt with improved control of R knee throughout today's session. Pt continues with decr R hip/knee flexion when performing obstacle negotiation, especially with side stepping to L and when trailing with RLE when forward stepping over obstacles with step to pattern. Will continue to progress towards LTGs.    Personal Factors and Comorbidities --   pt ungergoing chemo/radiation   Examination-Activity Limitations Bed Mobility;Locomotion Level;Sit;Squat;Stairs;Stand;Transfers    Examination-Participation Restrictions Community Activity;School;Driving    Stability/Clinical Decision Making Evolving/Moderate complexity    Rehab Potential Good    PT Frequency 2x / week    PT Duration 8 weeks    PT Treatment/Interventions ADLs/Self Care Home Management;Therapeutic exercise;Therapeutic activities;Functional mobility  training;Stair training;Gait training;DME Instruction;Balance training;Neuromuscular re-education;Patient/family education;Orthotic Fit/Training;Wheelchair mobility training;Energy conservation;Passive range of motion    PT Next Visit Plan any word on aquatics for PT? SLS activites on RLE. continue gait outdoors over unlevel surfaces/obstacles/curb and stairs with no AD. Needs to work on closed chain glute med activation on R and hip ext.  Half kneeling/tall kneeling for core/hip strengthening and balance.    PT Home Exercise Plan [id number]    Consulted and Agree with Plan of Care Patient;Family member/caregiver    Family Member Consulted Mom           Patient will benefit from skilled therapeutic intervention in order to improve the following deficits and impairments:  Abnormal gait, Decreased activity tolerance, Decreased balance, Decreased cognition, Decreased coordination, Decreased safety awareness, Decreased range of motion, Decreased mobility, Difficulty walking, Decreased strength, Decreased endurance, Impaired tone  Visit Diagnosis: Muscle weakness (generalized)  Other symptoms and signs involving the nervous system  Unsteadiness on feet  Other abnormalities of gait and mobility     Problem List Patient Active Problem List   Diagnosis Date Noted  . Goals of care, counseling/discussion 12/16/2018  . Palliative care by specialist   . 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 Spastic hemiparesis ([place])   . Cerebral edema ([facility])   . Intracranial tumor ([place])   .  Steroid-induced hyperglycemia   . Spastic hemiplegia affecting nondominant side ([place])   . Hyponatremia   . Transaminitis   . Leucocytosis   . Right spastic hemiplegia ([place]) 10/21/2018  . Dysphagia 10/21/2018  . 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 Brain mass   . ICH (intracerebral hemorrhage) ([place]) 10/13/2018    Arliss Journey, PT ,DPT  07/01/2019, 4:54 PM  [facility] [address] [place], [place], [address] Phone: [phone]   Fax:  [phone]  Name: MUNG RINKER MRN: [id number] Date of Birth: 12-Feb-1995

## 2019-07-01 NOTE — Telephone Encounter (Signed)
Scheduled appt per 6/15 los.  Left a vm of the appt date and time. 

## 2019-07-02 ENCOUNTER — Other Ambulatory Visit: Payer: Self-pay | Admitting: *Deleted

## 2019-07-06 ENCOUNTER — Ambulatory Visit: Payer: 59 | Admitting: Occupational Therapy

## 2019-07-06 ENCOUNTER — Encounter: Payer: Self-pay | Admitting: Occupational Therapy

## 2019-07-06 ENCOUNTER — Ambulatory Visit: Payer: 59 | Admitting: Physical Therapy

## 2019-07-06 ENCOUNTER — Other Ambulatory Visit: Payer: Self-pay

## 2019-07-06 ENCOUNTER — Encounter: Payer: Self-pay | Admitting: Speech Pathology

## 2019-07-06 ENCOUNTER — Ambulatory Visit: Payer: 59 | Admitting: Speech Pathology

## 2019-07-06 DIAGNOSIS — R41842 Visuospatial deficit: Secondary | ICD-10-CM

## 2019-07-06 DIAGNOSIS — R29818 Other symptoms and signs involving the nervous system: Secondary | ICD-10-CM

## 2019-07-06 DIAGNOSIS — R482 Apraxia: Secondary | ICD-10-CM

## 2019-07-06 DIAGNOSIS — R4701 Aphasia: Secondary | ICD-10-CM

## 2019-07-06 DIAGNOSIS — M6281 Muscle weakness (generalized): Secondary | ICD-10-CM

## 2019-07-06 DIAGNOSIS — G8191 Hemiplegia, unspecified affecting right dominant side: Secondary | ICD-10-CM

## 2019-07-06 DIAGNOSIS — R2681 Unsteadiness on feet: Secondary | ICD-10-CM

## 2019-07-06 DIAGNOSIS — R2689 Other abnormalities of gait and mobility: Secondary | ICD-10-CM

## 2019-07-06 NOTE — Therapy (Signed)
[facility] [address] [place], [place], [address] Phone: [phone]   Fax:  [phone]  Occupational Therapy Treatment  Patient Details  Name: Gregory Moreno MRN: [id number] Date of Birth: [birth year] Referring Provider (OT): Mar[place]e [place]   Encounter Date: 07/06/2019   OT End of Session - 07/06/19 1930    Visit Number 44    Number of Visits 46    Authorization Type No VL - No auth required    OT Start Time 1502    OT Stop Time 1546    OT Time Calculation (min) 44 min    Activity Tolerance Patient tolerated treatment well           History reviewed. No pertinent past medical history.  Past Surgical History:  Procedure Laterality Date  . CRANIOTOMY Left 10/13/2018   Procedure: LEFT CRANIOTOMY FOR TUMOR RESECTION;  Surgeon: Judith Part, MD;  Location: [facility];  Service: Neurosurgery;  Laterality: Left;    There were no vitals filed for this visit.   Subjective Assessment - 07/06/19 1926    Subjective  I am feeling a little down today - I am in the middle of a chemo round    Patient is accompanied by: Family member   mom   Pertinent History Glioblastoma    Currently in Pain? No/denies           Patient seen for aquatic therapy today.  Treatment took place in water 2.5-4 feet deep depending upon activity.  Pt entered the pool via ramp using 1 hand railing, mod facilitation for postural alignment and control for trunk, hip and RLE placement.  Utilized crouched walking with hand held assist to address more normalized step length bilaterally as well as to facilitate increased weight bearing and activation of RLE, alignment of trunk over BOS.  Progressed to crouch walking using large dumb bells for UE support (vs hand held) during crouched walking - initially pt require moderate assistance however with practice and repetition pt able to complete with only intermittent light facilitation via dumb bells and  cues.  Transitioned into supine using floatation devices to reduce spasticity.  Also in supine addressed RUE AAROM for shoulder hyper extension as well as ab/adduction - pt can initiate movement in all planes but requires assist for arc of movement.  In supine also utilized aqua stretch techniques to address hip flexors and hip adductors followed by AROM for strengthening for hip extension with knee extension as well as hip abduction with knee extension. Pt needs assistance for pelvic stabilization and alignment for these activities.  Transitioned into kneeling first at bottom of pool (chest deep) and then pt able to complete on seat at more shallow depth (approximately 2.5 feet). Utilized kneeling to address hip extension and trunk alignment as well as lateral weight shifting onto RLE while maintaining hip extension.  Transitioned into standing and addressed functional ambulation in open water with bilateral UE support on therapist's shoulder - pt required min facilitation today and able to complete with faster pace and more normalized step length - completed 2 lengths of pool.  Pt exited pool via ramp with one hand railing and moderate assistance for postural alignment and control. [name] RLE was weak at end of session -"it feels tired" so pt assisted to seat to allow pt to rest. Mom instructed to don AFO prior to any functional ambulation.  OT Short Term Goals - 06/08/19 1810      OT SHORT TERM GOAL #1   Title Patient will complete an HEP designed to improve active motion in RUE due 01/16/19    Status Achieved      OT SHORT TERM GOAL #2   Title Patient will reach forward with shoulder flexion, elbow extension pattern to make contact with target in midline and 8-12' from chest with intermittent assist    Status Achieved      OT SHORT TERM GOAL #3   Title Patient will transfer from wheelchair to commode with no more than supervision assistance - transferring  either toward right or toward left side    Status Achieved      OT SHORT TERM GOAL #4   Title Patient will grasp and release a cylindrical object 2-3" in diameter with min assist    Status Achieved      OT SHORT TERM GOAL #5   Title Patient will dress lower body with mod assist    Status Achieved      OT SHORT TERM GOAL #6   Title Patient will don/doff underwear, pants, and left shoe with min assist    Status Achieved      OT SHORT TERM GOAL #7   Title Patient will complete shower transfer with supervision    Status Achieved      OT SHORT TERM GOAL #8   Title Patient will bathe self in shower with supervision and cueing assistance    Status Achieved             OT Long Term Goals - 07/06/19 1927      OT LONG TERM GOAL #1   Title Patient will dress himself with no greater than set up assistance - 05/15/19    Status Achieved      OT LONG TERM GOAL #2   Title Patient will shower with supervision    Status Achieved      OT LONG TERM GOAL #3   Title Patient will cut food on plate with modified independence    Status Achieved      OT LONG TERM GOAL #4   Title Patient will demonstrate ability to retrieve a lightweight item (less than 2lb) from waist height or lower and transport 6-12 inches laterally with RUE    Status Deferred      OT LONG TERM GOAL #5   Title Patient will demonstrate RUE  mid level reach with minimal LUE support to hit a target at chest height directly in front of body    Status Deferred      OT LONG TERM GOAL #6   Title Patient will demonstrate improved postural alignment and control with functional mobility to reduce fall risk and spasticity in RUE and RLE. - 07/13/2019    Status On-going      OT LONG TERM GOAL #7   Title Pt and family will demonstrate understanding of aquatic based HEP to address balance, spasticity mgmt, LE strengthening    Status On-going                 Plan - 07/06/19 1928    Clinical Impression Statement Pt  progressing toward goals.  Pt will complete aquatic therapy for OT next session and will then participate in aquatic therapy as part of PT POC.  Pt and mom in agreement with plan.    OT Occupational Profile and History Comprehensive Assessment- Review of records and extensive additional  review of physical, cognitive, psychosocial history related to current functional performance    Occupational performance deficits (Please refer to evaluation for details): ADL's;IADL's;Rest and Sleep;Education;Work;Play;Leisure;Social Participation    Body Structure / Function / Physical Skills ADL;Decreased knowledge of use of DME;Gait;Strength;Balance;Dexterity;GMC;Tone;Body mechanics;Hearing;Proprioception;UE functional use;Endurance;IADL;ROM;Vestibular;Vision;Coordination;Flexibility;Mobility;Sensation;FMC;Decreased knowledge of precautions    Cognitive Skills Attention;Problem Solve;Emotional;Safety Awareness;Sequencing;Memory;Learn;Thought;Understand;Perception    Psychosocial Skills Routines and Behaviors;Interpersonal Interaction    Rehab Potential Good    Clinical Decision Making Multiple treatment options, significant modification of task necessary    Comorbidities Affecting Occupational Performance: May have comorbidities impacting occupational performance    Modification or Assistance to Complete Evaluation  Min-Moderate modification of tasks or assist with assess necessary to complete eval    OT Frequency 1x / week    OT Duration 8 weeks    OT Treatment/Interventions Self-care/ADL training;DME and/or AE instruction;Splinting;Balance training;Aquatic Therapy;Therapeutic activities;Therapeutic exercise;Cognitive remediation/compensation;Neuromuscular education;Functional Mobility Training;Visual/perceptual remediation/compensation;Electrical Stimulation;Manual Therapy;Patient/family education    Plan aquatic therapy to address postural alignment and control, incorporation and activation of R side to decrease  spasitcity and reduce fall risk, d/c from OT aquatic therapy next session - pt to continue with aquatic therapy as part of PT POC at that time.    Consulted and Agree with Plan of Care Patient;Family member/caregiver    Family Member Consulted mom           Patient will benefit from skilled therapeutic intervention in order to improve the following deficits and impairments:   Body Structure / Function / Physical Skills: ADL, Decreased knowledge of use of DME, Gait, Strength, Balance, Dexterity, GMC, Tone, Body mechanics, Hearing, Proprioception, UE functional use, Endurance, IADL, ROM, Vestibular, Vision, Coordination, Flexibility, Mobility, Sensation, FMC, Decreased knowledge of precautions Cognitive Skills: Attention, Problem Solve, Emotional, Safety Awareness, Sequencing, Memory, Learn, Thought, Understand, Perception Psychosocial Skills: Routines and Behaviors, Interpersonal Interaction   Visit Diagnosis: Muscle weakness (generalized)  Other symptoms and signs involving the nervous system  Unsteadiness on feet  Hemiplegia affecting right dominant side, unspecified etiology, unspecified hemiplegia type ([place])  Apraxia  Visuospatial deficit    Problem List Patient Active Problem List   Diagnosis Date Noted  . Goals of care, counseling/discussion 12/16/2018  . Palliative care by specialist   . 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 Spastic hemiparesis ([place])   . Cerebral edema ([facility])   . Intracranial tumor ([place])   . Steroid-induced hyperglycemia   . Spastic hemiplegia affecting nondominant side ([place])   . Hyponatremia   . Transaminitis   . Leucocytosis   . Right spastic hemiplegia ([place]) 10/21/2018  . Dysphagia 10/21/2018  . 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 Brain mass   . ICH (intracerebral hemorrhage) ([place]) 10/13/2018    Quay Burow, OTR/L 07/06/2019, 7:31 PM  [facility] [address] [place], [place], [address] Phone: [phone]   Fax:  [phone]  Name: RICHY SPRADLEY MRN: [id number] Date of Birth: March 18, 1995

## 2019-07-06 NOTE — Therapy (Signed)
[facility] [address] [place], [place], [address] Phone: [phone]   Fax:  [phone]  Speech Language Pathology Treatment  Patient Details  Name: Gregory Moreno MRN: [id number] Date of Birth: 05/17/1995 Referring Provider (SLP): Alger Simons, MD   Encounter Date: 07/06/2019   End of Session - 07/06/19 1410    Visit Number 40    Number of Visits 47    Date for SLP Re-Evaluation 08/21/19    Authorization - Visit Number 16    Authorization - Number of Visits 30    SLP Start Time 1232    SLP Stop Time  0300    SLP Time Calculation (min) 41 min           History reviewed. No pertinent past medical history.  Past Surgical History:  Procedure Laterality Date  . CRANIOTOMY Left 10/13/2018   Procedure: LEFT CRANIOTOMY FOR TUMOR RESECTION;  Surgeon: Judith Part, MD;  Location: [facility];  Service: Neurosurgery;  Laterality: Left;    There were no vitals filed for this visit.   Subjective Assessment - 07/06/19 1318    Subjective "We fell offf of the schedule"    Patient is accompained by: Family member    Currently in Pain? No/denies                 ADULT SLP TREATMENT - 07/06/19 1256      General Information   Behavior/Cognition Alert;Cooperative;Pleasant mood;Requires cueing      Treatment Provided   Treatment provided Cognitive-Linquistic      Cognitive-Linquistic Treatment   Treatment focused on Aphasia;Apraxia;Patient/family/caregiver education    Skilled Treatment Added text to speech option on AJ's phone. Trained pt on use of text to speech on his phone, pt demonstrated use of this with an email and text with occasional min A. Introduced AJ to [employer] for home practice. AJ demonstrated use of Talk Path with text to speech, 2/3 quesitons answered correctly.       Assessment / Recommendations / Plan   Plan Continue with current plan of care      Progression Toward Goals    Progression toward goals Progressing toward goals            SLP Education - 07/06/19 1319    Education Details use text to speech, talk path news    Person(s) Educated Patient;Parent(s)    Methods Explanation;Demonstration    Comprehension Verbalized understanding;Returned demonstration;Need further instruction            SLP Short Term Goals - 07/06/19 1409      SLP SHORT TERM GOAL #1   Title pt will complete aphasia assessment of auditory comprehension and verbal expression within 1-2 sessions    Status Achieved      SLP SHORT TERM GOAL #2   Title pt will demo understanding of simple commands (spoken) with 70% and occasional min cues over three sessions    Time 1    Period Weeks    Status On-going      SLP SHORT TERM GOAL #3   Title pt will answer pertinent questions with at least 3 words    Status Not Met      SLP SHORT TERM GOAL #4   Title pt will demo sustained/selective attention necessary to complete efficacious speech therapy in 6 sessions    Baseline 04-07-19, 04-27-19, 04-29-19, 06-08-19    Time 1    Period Weeks   or 8 sessions  Status On-going            SLP Long Term Goals - 07/06/19 1409      SLP LONG TERM GOAL #1   Title pt will demo understanding of simple-mod complex conversation 75% of the time and rare min cues over three sessions    Baseline 02-06-19, 06-01-19, 06-03-19    Period --   or 17 sessions, for all LTGs   Status Achieved      SLP LONG TERM GOAL #2   Title pt will answer 5 questions with [facility] verbal responses, in 3 sessions    Baseline 01-27-19, 04-29-19    Status Achieved      SLP LONG TERM GOAL #3   Title pt will sustain loud /a/ with average mid 70s dB over 4 sessions    Time 5    Period Weeks    Status Deferred   due to focus on language     SLP LONG TERM GOAL #4   Title pt will engage in simple conversation for 5 minutes with average loudness mid 60s dB over 3 sessions    Time 5    Period Weeks    Status Deferred   due to  focus on language     SLP LONG TERM GOAL #5   Title pt will generate 2 subjects and 3 objects for a given transitive verb with occasional min-mod A    Time 4    Period Weeks   or 47 total visits (for LTGs 5 and on); renewed 06-10-19   Status Not Met   and ongoing     SLP LONG TERM GOAL #6   Title pt will participate functionally simple-mod complex conversation for 8 minutes in 2 sessions (receptively and expressively)    Time 4    Period Weeks    Status On-going      SLP LONG TERM GOAL #7   Title pt will successfully access text-to-speech function on his phone in 3 sessions    Time 4    Period Weeks    Status On-going      SLP LONG TERM GOAL #8   Title pt will demo understanding of >/= 1 sentence text messages, given modifications and compensations, in 3 sessions    Time 4    Period Weeks    Status On-going            Plan - 07/06/19 1320    Duration --   13 weeks or 41 visits   Treatment/Interventions Environmental controls;Functional tasks;Compensatory techniques;Multimodal communcation approach;SLP instruction and feedback;Cueing hierarchy;Language facilitation;Cognitive reorganization;Internal/external aids;Patient/family education    Potential to Achieve Goals Good           Patient will benefit from skilled therapeutic intervention in order to improve the following deficits and impairments:   Aphasia  Verbal apraxia    Problem List Patient Active Problem List   Diagnosis Date Noted  . Goals of care, counseling/discussion 12/16/2018  . Palliative care by specialist   . Glioblastoma multiforme of temporal lobe ([facility]) 11/11/2018  . Spastic hemiparesis ([facility])   . Cerebral edema ([facility])   . Intracranial tumor ([place])   . Steroid-induced hyperglycemia   . Spastic hemiplegia affecting nondominant side ([facility])   . Hyponatremia   . Transaminitis   . Leucocytosis   . Right spastic hemiplegia ([facility]) 10/21/2018  . Dysphagia 10/21/2018  . 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 Brain mass   . ICH (intracerebral hemorrhage) ([facility]) 10/13/2018  Merriam Brandner, Annye Rusk MS, CCC-SLP 07/06/2019, 2:11 PM  [facility] [address] [place], [place], [address] Phone: [phone]   Fax:  [phone]   Name: Gregory Moreno MRN: [id number] Date of Birth: 1995-04-25

## 2019-07-07 NOTE — Therapy (Signed)
[facility] [address] [place], [place], [address] Phone: [phone]   Fax:  [phone]  Physical Therapy Treatment  Patient Details  Name: Gregory Moreno MRN: [id number] Date of Birth: [birth year] Referring Provider (PT): Alger Simons, MD   Encounter Date: 07/06/2019   PT End of Session - 07/07/19 0922    Visit Number 45    Number of Visits 57    Date for PT Re-Evaluation 08/12/19    Authorization Type [employer] - 2021, no VL, $40 copay per day (regardless of how many visits)    PT Start Time 1320    PT Stop Time 1400    PT Time Calculation (min) 40 min    Equipment Utilized During Treatment Gait belt    Activity Tolerance Patient tolerated treatment well    Behavior During Therapy [facility] for tasks assessed/performed           No past medical history on file.  Past Surgical History:  Procedure Laterality Date  . CRANIOTOMY Left 10/13/2018   Procedure: LEFT CRANIOTOMY FOR TUMOR RESECTION;  Surgeon: Judith Part, MD;  Location: [facility];  Service: Neurosurgery;  Laterality: Left;    There were no vitals filed for this visit.   Subjective Assessment - 07/06/19 1323    Subjective Day 4 of chemo - has just been tired. Had a good birthday, went to a [employer] steakhouse    Patient is accompained by: Family member    How long can you walk comfortably? 100' with hemiwalker    Patient Stated Goals wants to improve his walking, get back to normal    Currently in Pain? No/denies                             [facility] Adult PT Treatment/Exercise - 07/07/19 1049      Ambulation/Gait   Ambulation/Gait Yes    Ambulation/Gait Assistance 4: Min guard;4: Min assist    Ambulation/Gait Assistance Details therapist providing facilitation for incr weight shift to RLE and cues for longer step length with LLE, performed scanning environment over indoor surfaces with asking pt to look towards R/L and up/down with  pt having no overt LOB    Ambulation Distance (Feet) 300 Feet    Assistive device None    Gait Pattern Step-through pattern;Decreased hip/knee flexion - right;Decreased dorsiflexion - right;Right genu recurvatum;Poor foot clearance - right    Ambulation Surface Level;Indoor;Unlevel;Paved      Exercises   Exercises Other Exercises    Other Exercises  Pt transitioning from sit > supine > L sidelying > prone with supervision, in prone: 2 x10 reps R knee flexion AAROM from therapist with facilitation by tapping for R knee flexion, x5 reps with R knee bent hip extension AAROM, cues for pt to activate R glutes first for hip extensor strengthening. In supine: bridging x5 reps on blue balance discs with BLE for compliant surface for balance and core strengthening, progressing to alternating single leg stance bridge with RLE staying on balance disc and marching LLE off disc x6 reps with therapist helping maintaining position of RLE, therapist resisted R hip flexion marching x10 reps in hooklying position, x10 reps bridging with use of gait belt for isometric hip ABD x10 reps - cues to push out into belt with BLE for carryover to LLE, needs tactile cues for technique. Seated at edge of mat, therapist performing ankle DF stretch to R 4 x 30  seconds with RLE extended, educated pt's mom on importance on performing at home. Then performing closed chain ankle DF stretch in sitting with pt shifting weight forward x5 reps.                   PT Education - 07/07/19 [phone]    Education Details importance of performing ankle DF stretch at home    Person(s) Educated Patient;Parent(s)    Methods Explanation;Demonstration    Comprehension Verbalized understanding            PT Short Term Goals - 06/11/19 1654      PT SHORT TERM GOAL #1   Title Pt will undergo further assessment of gait speed with no AD - LTG to be written as appropriate. ALL STGS DUE 07/09/19    Time 4    Period Weeks    Status New     Target Date 07/09/19      PT SHORT TERM GOAL #2   Title Pt will undergo further assessment of TUG with no AD in order to determine fall risk - LTG to be written as appropriate    Time 4    Period Weeks    Status New      PT SHORT TERM GOAL #3   Title Pt will improve gait speed with small base quad cane to at least 2.1 ft/sec in order to improve community mobility.    Baseline 1.87 ft/sec    Time 4    Period Weeks    Status New      PT SHORT TERM GOAL #4   Title Pt will perform a curb with supervision with no AD in order to improve community mobility and independence    Time 4    Period Weeks    Status New      PT SHORT TERM GOAL #5   Title Pt will ambulate at least 100' outdoors on grass with min guard with no AD in order to improve functional mobility while ambulating outdoors with family.    Baseline min A at times for balance    Time 4    Period Weeks             PT Long Term Goals - 06/11/19 1657      PT LONG TERM GOAL #1   Title Gait speed goal to be written as appropriate with no AD. ALL LTGS DUE 08/10/19    Baseline 1.87 ft/sec with small base quad cane    Time 8    Period Weeks    Status New    Target Date 08/10/19      PT LONG TERM GOAL #2   Title Pt will ambulate at least 200' over outdoor paved/gravel/mulch surfaces with supervision in order to improve functional mobility with no AD while camping/going on walks with family.    Baseline supervision over indoor suraces, min guard/min A over outdoor surfaces with no AD    Time 8    Period Weeks    Status New      PT LONG TERM GOAL #3   Title Pt will perform a curb with MOD I with no AD in order to improve community mobility and independence.    Baseline min guard with no AD    Time 8    Period Weeks    Status Revised      PT LONG TERM GOAL #4   Title TUG goal to be written as appropriate to determine fall risk.  Time 8    Period Weeks    Status New      PT LONG TERM GOAL #5   Title Patient will  perform at least 4 steps using single handrail and step to pattern with supervision in order to safely enter/exit the RV.    Baseline 4 steps with single handrail and step to pattern - needs min A at times, overwise min guard on steps to clear RLE due to lip on step, min guard/supervision while descending    Time 8    Period Weeks    Status Revised                 Plan - 07/07/19 0925    Clinical Impression Statement Pt able to perform supine > L sidelying > prone position with supervision and pt able to tolerate prone position today for approx. 5 minutes while performing AAROM of R hamstrings and hip extensors, also for more prolonged hip flexor stretch. Pt with decr R ankle DF ROM, performed stretching with educating pt's mom about continued importance of performing at home. Pt demonstrating improved knee control with gait with no AD, even on unlevel paved surfaces. Will continue to progress towards LTGs.    Personal Factors and Comorbidities --   pt ungergoing chemo/radiation   Examination-Activity Limitations Bed Mobility;Locomotion Level;Sit;Squat;Stairs;Stand;Transfers    Examination-Participation Restrictions Community Activity;School;Driving    Stability/Clinical Decision Making Evolving/Moderate complexity    Rehab Potential Good    PT Frequency 2x / week    PT Duration 8 weeks    PT Treatment/Interventions ADLs/Self Care Home Management;Therapeutic exercise;Therapeutic activities;Functional mobility training;Stair training;Gait training;DME Instruction;Balance training;Neuromuscular re-education;Patient/family education;Orthotic Fit/Training;Wheelchair mobility training;Energy conservation;Passive range of motion    PT Next Visit Plan need to check STGs! any word on aquatics for PT? SLS activites on RLE. continue gait outdoors over unlevel surfaces/obstacles/curb and stairs with no AD. Needs to work on closed chain glute med activation on R and hip ext.  Half kneeling/tall kneeling  for core/hip strengthening and balance.    PT Home Exercise Plan [id number]    Consulted and Agree with Plan of Care Patient;Family member/caregiver    Family Member Consulted Mom           Patient will benefit from skilled therapeutic intervention in order to improve the following deficits and impairments:  Abnormal gait, Decreased activity tolerance, Decreased balance, Decreased cognition, Decreased coordination, Decreased safety awareness, Decreased range of motion, Decreased mobility, Difficulty walking, Decreased strength, Decreased endurance, Impaired tone  Visit Diagnosis: Other symptoms and signs involving the nervous system  Muscle weakness (generalized)  Unsteadiness on feet  Other abnormalities of gait and mobility     Problem List Patient Active Problem List   Diagnosis Date Noted  . Goals of care, counseling/discussion 12/16/2018  . Palliative care by specialist   . Glioblastoma multiforme of temporal lobe ([facility]) 11/11/2018  . Spastic hemiparesis ([place])   . Cerebral edema ([facility])   . Intracranial tumor ([place])   . Steroid-induced hyperglycemia   . Spastic hemiplegia affecting nondominant side ([facility])   . Hyponatremia   . Transaminitis   . Leucocytosis   . Right spastic hemiplegia ([place]) 10/21/2018  . Dysphagia 10/21/2018  . 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 Brain mass   . ICH (intracerebral hemorrhage) ([facility]) 10/13/2018    Arliss Journey, PT, DPT  07/07/2019, 10:50 AM  [facility] [address] [place], [place], [address] Phone: [phone]   Fax:  [phone]  Name: Gregory Moreno MRN: [id number] Date of Birth: 11-Jan-1996

## 2019-07-08 ENCOUNTER — Ambulatory Visit: Payer: 59 | Admitting: Physical Therapy

## 2019-07-08 ENCOUNTER — Encounter: Payer: Self-pay | Admitting: Physical Therapy

## 2019-07-08 ENCOUNTER — Other Ambulatory Visit: Payer: Self-pay

## 2019-07-08 ENCOUNTER — Other Ambulatory Visit: Payer: Self-pay | Admitting: Internal Medicine

## 2019-07-08 DIAGNOSIS — R2681 Unsteadiness on feet: Secondary | ICD-10-CM

## 2019-07-08 DIAGNOSIS — M6281 Muscle weakness (generalized): Secondary | ICD-10-CM | POA: Diagnosis not present

## 2019-07-08 DIAGNOSIS — C712 Malignant neoplasm of temporal lobe: Secondary | ICD-10-CM

## 2019-07-08 DIAGNOSIS — R29818 Other symptoms and signs involving the nervous system: Secondary | ICD-10-CM

## 2019-07-08 DIAGNOSIS — R2689 Other abnormalities of gait and mobility: Secondary | ICD-10-CM

## 2019-07-08 NOTE — Telephone Encounter (Signed)
Refill request. Thank you

## 2019-07-08 NOTE — Therapy (Signed)
[facility] [address] [place], [place], [address] Phone: [phone]   Fax:  [phone]  Physical Therapy Treatment  Patient Details  Name: Gregory Moreno MRN: [id number] Date of Birth: 06-22-1995 Referring Provider (PT): Alger Simons, MD   Encounter Date: 07/08/2019   PT End of Session - 07/08/19 1509    Visit Number 46    Number of Visits 70    Date for PT Re-Evaluation 08/12/19    Authorization Type [employer] - 2021, no VL, $40 copay per day (regardless of how many visits)    PT Start Time 1404    PT Stop Time 1450    PT Time Calculation (min) 46 min    Equipment Utilized During Treatment Gait belt    Activity Tolerance Patient tolerated treatment well    Behavior During Therapy [facility] for tasks assessed/performed           History reviewed. No pertinent past medical history.  Past Surgical History:  Procedure Laterality Date  . CRANIOTOMY Left 10/13/2018   Procedure: LEFT CRANIOTOMY FOR TUMOR RESECTION;  Surgeon: Judith Part, MD;  Location: [facility];  Service: Neurosurgery;  Laterality: Left;    There were no vitals filed for this visit.   Subjective Assessment - 07/08/19 1408    Subjective Feeling a little more nauseous today from the chemo.    Patient is accompained by: Family member    How long can you walk comfortably? 100' with hemiwalker    Patient Stated Goals wants to improve his walking, get back to normal    Currently in Pain? No/denies              [facility] PT Assessment - 07/08/19 1416      Standardized Balance Assessment   Standardized Balance Assessment Timed Up and Go Test      Timed Up and Go Test   Normal TUG (seconds) 13.75   with small base quad cane   TUG Comments 15.25 with no AD                         [facility] Adult PT Treatment/Exercise - 07/08/19 1416      Ambulation/Gait   Ambulation/Gait Yes    Ambulation/Gait Assistance 4: Min guard;4: Min assist      Ambulation/Gait Assistance Details ambulated over grass and paved surfaces with intermittent min A for incr weight shift towards RLE for incr step length with LLE, esp over grass surfaces, over grass performed 2 x 15' reps of side stepping R/L with focus on incr weight shift and incr step length and for hip ABD activation    Ambulation Distance (Feet) 300 Feet    Assistive device None    Gait Pattern Step-through pattern;Decreased hip/knee flexion - right;Decreased dorsiflexion - right;Right genu recurvatum;Poor foot clearance - right    Ambulation Surface Level;Indoor;Outdoor;Grass    Gait velocity 18.38 seconds = 1.78 ft/sec with small base quad cane, 16.13 seconds = 2.03 ft/sec with no AD     Stairs Yes    Stairs Assistance 4: Min guard;4: Min assist    Stairs Assistance Details (indicate cue type and reason) needing min A for foot clearance with RLE due to decr hip/knee flexion strength and to clear toe on lip of stair, descending pt needing cues for wider BOS with RLE when descending in order to incr BOS and perform with step over step pattern    Stair Management Technique Alternating pattern;One  rail Left;Forwards    Number of Stairs 12    Height of Stairs 6    Curb 5: Supervision   min guard when ascending   Curb Details (indicate cue type and reason) x2 reps, descending with RLE and ascending with LLE with focus on maintaining momentum when ascending curb    Gait Comments 14.1 seconds with small base quad cane = 2.32 ft/sec, 2nd attempt                   PT Education - 07/08/19 1509    Education Details progress towards goals, areas to continue working on in therapy    Person(s) Educated Patient;Parent(s)    Methods Explanation    Comprehension Verbalized understanding            PT Short Term Goals - 07/08/19 1414      PT SHORT TERM GOAL #1   Title Pt will undergo further assessment of gait speed with no AD - LTG to be written as appropriate. ALL STGS DUE 07/09/19     Baseline 16.13 seconds = 2.03 ft/sec with no AD    Time 4    Period Weeks    Status Achieved    Target Date 07/09/19      PT SHORT TERM GOAL #2   Title Pt will undergo further assessment of TUG with no AD in order to determine fall risk - LTG to be written as appropriate    Baseline 15.25 seconds with no AD    Time 4    Period Weeks    Status Achieved      PT SHORT TERM GOAL #3   Title Pt will improve gait speed with small base quad cane to at least 2.1 ft/sec in order to improve community mobility.    Baseline 1.87 ft/sec, 14.1 seconds with small base quad cane = 2.32 ft/sec, 16.13 seconds = 2.03 ft/sec with no AD    Time 4    Period Weeks    Status Achieved      PT SHORT TERM GOAL #4   Title Pt will perform a curb with supervision with no AD in order to improve community mobility and independence    Baseline supervision while descending, min guard while ascending    Time 4    Period Weeks    Status Partially Met      PT SHORT TERM GOAL #5   Title Pt will ambulate at least 100' outdoors on grass with min guard with no AD in order to improve functional mobility while ambulating outdoors with family.    Baseline min guard/min A over grass surfaces with no AD    Time 4    Period Weeks    Status Partially Met             PT Long Term Goals - 07/08/19 1511      PT LONG TERM GOAL #1   Title Pt will improve gait speed with no AD to at least 2.2 ft/sec in order to demo improved gait efficiency. ALL LTGS DUE 08/10/19    Baseline 16.13 seconds = 2.03 ft/sec with no AD    Time 8    Period Weeks    Status Revised      PT LONG TERM GOAL #2   Title Pt will ambulate at least 200' over outdoor paved/gravel/mulch surfaces with supervision in order to improve functional mobility with no AD while camping/going on walks with family.    Baseline supervision  over indoor suraces, min guard/min A over outdoor surfaces with no AD    Time 8    Period Weeks    Status New      PT LONG  TERM GOAL #3   Title Pt will perform a curb with MOD I with no AD in order to improve community mobility and independence.    Baseline min guard with no AD    Time 8    Period Weeks    Status Revised      PT LONG TERM GOAL #4   Title Pt will decr TUG time to 13.5 seconds or less with no AD and with small base quad cane in order to demo decr fall risk.    Baseline 13.75 seconds with small base quad cane, 15.25 with no AD    Time 8    Period Weeks    Status Revised      PT LONG TERM GOAL #5   Title Patient will perform at least 4 steps using single handrail and step to pattern with supervision in order to safely enter/exit the RV.    Baseline 4 steps with single handrail and step to pattern - needs min A at times, overwise min guard on steps to clear RLE due to lip on step, min guard/supervision while descending    Time 8    Period Weeks    Status Revised      PT LONG TERM GOAL #6   Title Pt will improve gait speed with small base quad cane to at least 2.5 ft/sec in order to demo improved community mobility.    Baseline 2.32 ft/sec on 07/08/19    Time 8    Period Weeks    Status New                 Plan - 07/08/19 1519    Clinical Impression Statement Pt with no incr nausea throughout session today due to chemo. Focused on assessing pt's STGs. Pt has achieved 3 out of 5 STGs and partially met 2 out of 5 STGs. [name] gait speed with no AD was 2.03 ft/sec and gait speed with small base quad cane was 2.32 ft/sec (previously 1.87 ft/sec), indicating pt is a limited community ambulator. Performed the TUG with and without an AD, pt performing the TUG in 15.25 seconds with no AD and with small base quad cane pt performing in 13.75 seconds. Pt able to ambulate outdoors over grass surfaces with min guard/min A at times for balance and for weight shift towards RLE. Pt improving in being able to perform curbs with no AD with incr forward momentum while ascending, however does still need min  guard. Pt is making excellent progress with PT. LTGs revised as appropriate.    Personal Factors and Comorbidities --   pt ungergoing chemo/radiation   Examination-Activity Limitations Bed Mobility;Locomotion Level;Sit;Squat;Stairs;Stand;Transfers    Examination-Participation Restrictions Community Activity;School;Driving    Stability/Clinical Decision Making Evolving/Moderate complexity    Rehab Potential Good    PT Frequency 2x / week    PT Duration 8 weeks    PT Treatment/Interventions ADLs/Self Care Home Management;Therapeutic exercise;Therapeutic activities;Functional mobility training;Stair training;Gait training;DME Instruction;Balance training;Neuromuscular re-education;Patient/family education;Orthotic Fit/Training;Wheelchair mobility training;Energy conservation;Passive range of motion    PT Next Visit Plan SLS activites on RLE. continue gait outdoors over unlevel surfaces/obstacles/curb and stairs with no AD. Needs to work on closed chain glute med activation on R and hip ext.  Half kneeling/tall kneeling for core/hip strengthening and balance. picking  up objects off floor.    PT Home Exercise Plan [id number]    Consulted and Agree with Plan of Care Patient;Family member/caregiver    Family Member Consulted Mom           Patient will benefit from skilled therapeutic intervention in order to improve the following deficits and impairments:  Abnormal gait, Decreased activity tolerance, Decreased balance, Decreased cognition, Decreased coordination, Decreased safety awareness, Decreased range of motion, Decreased mobility, Difficulty walking, Decreased strength, Decreased endurance, Impaired tone  Visit Diagnosis: Other symptoms and signs involving the nervous system  Muscle weakness (generalized)  Unsteadiness on feet  Other abnormalities of gait and mobility     Problem List Patient Active Problem List   Diagnosis Date Noted  . Goals of care, counseling/discussion 12/16/2018   . Palliative care by specialist   . 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 Spastic hemiparesis ([place])   . Cerebral edema (HCC)   . Intracranial tumor ([place])   . Steroid-induced hyperglycemia   . Spastic hemiplegia affecting nondominant side ([place])   . Hyponatremia   . Transaminitis   . Leucocytosis   . Right spastic hemiplegia ([place]) 10/21/2018  . Dysphagia 10/21/2018  . 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 Brain mass   . ICH (intracerebral hemorrhage) ([place]) 10/13/2018    Arliss Journey, PT, DPT  07/08/2019, 3:22 PM  [facility] [address] [place], [place], [address] Phone: [phone]   Fax:  [phone]  Name: Gregory Moreno MRN: [id number] Date of Birth: 1995/03/20

## 2019-07-13 ENCOUNTER — Encounter: Payer: Self-pay | Admitting: Occupational Therapy

## 2019-07-13 ENCOUNTER — Ambulatory Visit: Payer: 59 | Admitting: Occupational Therapy

## 2019-07-13 ENCOUNTER — Other Ambulatory Visit: Payer: Self-pay

## 2019-07-13 DIAGNOSIS — R482 Apraxia: Secondary | ICD-10-CM

## 2019-07-13 DIAGNOSIS — R41842 Visuospatial deficit: Secondary | ICD-10-CM

## 2019-07-13 DIAGNOSIS — M6281 Muscle weakness (generalized): Secondary | ICD-10-CM | POA: Diagnosis not present

## 2019-07-13 DIAGNOSIS — R2681 Unsteadiness on feet: Secondary | ICD-10-CM

## 2019-07-13 DIAGNOSIS — G8191 Hemiplegia, unspecified affecting right dominant side: Secondary | ICD-10-CM

## 2019-07-13 DIAGNOSIS — R29818 Other symptoms and signs involving the nervous system: Secondary | ICD-10-CM

## 2019-07-13 NOTE — Therapy (Signed)
[facility] [address] [place], [place], [address] Phone: [phone]   Fax:  [phone]  Occupational Therapy Treatment  Patient Details  Name: Gregory Moreno MRN: [id number] Date of Birth: 01/09/1996 Referring Provider (OT): [place]   Encounter Date: 07/13/2019   OT End of Session - 07/13/19 1905    Visit Number 45    Number of Visits 46    Date for OT Re-Evaluation 07/13/19    Authorization Type No VL - No auth required    OT Start Time 1502    OT Stop Time 1545    OT Time Calculation (min) 43 min    Activity Tolerance Patient tolerated treatment well           History reviewed. No pertinent past medical history.  Past Surgical History:  Procedure Laterality Date  . CRANIOTOMY Left 10/13/2018   Procedure: LEFT CRANIOTOMY FOR TUMOR RESECTION;  Surgeon: Judith Part, MD;  Location: [facility];  Service: Neurosurgery;  Laterality: Left;    There were no vitals filed for this visit.   Subjective Assessment - 07/13/19 1901    Subjective  I guess I am walking a little better    Patient is accompanied by: Family member   mom   Pertinent History Glioblastoma    Currently in Pain? No/denies              Patient seen for aquatic therapy today.  Treatment took place in water 2.5-4 feet deep depending upon activity.  Pt entered the pool via steps using 1 hand railing, cues for sequencing and guide/contact guard for placement of RLE.  Utilized crouched walking with hand held assist to facilitate more normalized step length, weight shifting over RLE, and postural alignment and control.  Transitioned into supine and utilized supported floating to decrease spasticity and rigidity. Utilized [place] to address trunk mobility with emphasis on lower trunk on upper trunk movement.  In supine utilized aqua stretch techniques to address improved ROM for hip extension followed immediately by demand for active hip  extension with knee extension using water and buoyancy for resistance. Also utilized aqua stretch techniques for improved ROM into hip abduction with decreased adductor spasticity followed by immediate demand for active hip ab/adduction with water as resistance.  Transitioned into kneeling in deep water and addressed active hip extension with trunk alignment moving from squatting to high kneeling with repetition and then progressing to high kneeling with active hip extension engagement with lateral weight shifting onto RLE. Advanced to half kneeling to use RLE for transitioning from kneeling into half squat to further activate and load RLE. Transitioned into standing and addressed functional ambulation with emphasis on postural alignment and control length of pool x2 - pt today needing less facilitation for postural alignment and R hip control resulting in improved postural alignment and control with decreased functional LOB.  Pt exited the pool via steps using 1 hand railing and contact guard.    Note:  Pt to d/c from OT today and continue aquatic therapy via PT services to focus more on gait. Pt and mom in agreement with plan.                       OT Short Term Goals - 07/13/19 1901      OT SHORT TERM GOAL #1   Title Patient will complete an HEP designed to improve active motion in RUE due 01/16/19    Status  Achieved      OT SHORT TERM GOAL #2   Title Patient will reach forward with shoulder flexion, elbow extension pattern to make contact with target in midline and 8-12' from chest with intermittent assist    Status Achieved      OT SHORT TERM GOAL #3   Title Patient will transfer from wheelchair to commode with no more than supervision assistance - transferring either toward right or toward left side    Status Achieved      OT SHORT TERM GOAL #4   Title Patient will grasp and release a cylindrical object 2-3" in diameter with min assist    Status Achieved      OT SHORT  TERM GOAL #5   Title Patient will dress lower body with mod assist    Status Achieved      OT SHORT TERM GOAL #6   Title Patient will don/doff underwear, pants, and left shoe with min assist    Status Achieved      OT SHORT TERM GOAL #7   Title Patient will complete shower transfer with supervision    Status Achieved      OT [place] #8   Title Patient will bathe self in shower with supervision and cueing assistance    Status Achieved             OT Long Term Goals - 07/13/19 1902      OT LONG TERM GOAL #1   Title Patient will dress himself with no greater than set up assistance - 05/15/19    Status Achieved      OT LONG TERM GOAL #2   Title Patient will shower with supervision    Status Achieved      OT LONG TERM GOAL #3   Title Patient will cut food on plate with modified independence    Status Achieved      OT LONG TERM GOAL #4   Title Patient will demonstrate ability to retrieve a lightweight item (less than 2lb) from waist height or lower and transport 6-12 inches laterally with RUE    Status Deferred      OT LONG TERM GOAL #5   Title Patient will demonstrate RUE  mid level reach with minimal LUE support to hit a target at chest height directly in front of body    Status Deferred      OT LONG TERM GOAL #6   Title Patient will demonstrate improved postural alignment and control with functional mobility to reduce fall risk and spasticity in RUE and RLE. - 07/13/2019    Status Achieved      OT LONG TERM GOAL #7   Title Pt and family will demonstrate understanding of aquatic based HEP to address balance, spasticity mgmt, LE strengthening    Status Deferred   pt to continue with aquatic therapy via PT services will defer education of aquatic HEP to PT.                Plan - 07/13/19 1903    Clinical Impression Statement Pt has met all STG's and all LTG's (except 3 goals that were deferred - see LTG section for details). Pt is ready for d/c from OT and  pt and mom in agreement.    OT Occupational Profile and History Comprehensive Assessment- Review of records and extensive additional review of physical, cognitive, psychosocial history related to current functional performance    Body Structure / Function / Physical Skills  ADL;Decreased knowledge of use of DME;Gait;Strength;Balance;Dexterity;GMC;Tone;Body mechanics;Hearing;Proprioception;UE functional use;Endurance;IADL;ROM;Vestibular;Vision;Coordination;Flexibility;Mobility;Sensation;FMC;Decreased knowledge of precautions    Cognitive Skills Attention;Problem Solve;Emotional;Safety Awareness;Sequencing;Memory;Learn;Thought;Understand;Perception    Psychosocial Skills Routines and Behaviors;Interpersonal Interaction    Rehab Potential Good    Clinical Decision Making Multiple treatment options, significant modification of task necessary    Comorbidities Affecting Occupational Performance: May have comorbidities impacting occupational performance    Modification or Assistance to Complete Evaluation  Min-Moderate modification of tasks or assist with assess necessary to complete eval    OT Frequency 1x / week    OT Duration 8 weeks    OT Treatment/Interventions Self-care/ADL training;DME and/or AE instruction;Splinting;Balance training;Aquatic Therapy;Therapeutic activities;Therapeutic exercise;Cognitive remediation/compensation;Neuromuscular education;Functional Mobility Training;Visual/perceptual remediation/compensation;Electrical Stimulation;Manual Therapy;Patient/family education    Plan d/c from OT today. Pt to continue with aquatic therapy through PT service to further focus on gait.    Consulted and Agree with Plan of Care Patient;Family member/caregiver    Family Member Consulted mom           Patient will benefit from skilled therapeutic intervention in order to improve the following deficits and impairments:   Body Structure / Function / Physical Skills: ADL, Decreased knowledge of use  of DME, Gait, Strength, Balance, Dexterity, GMC, Tone, Body mechanics, Hearing, Proprioception, UE functional use, Endurance, IADL, ROM, Vestibular, Vision, Coordination, Flexibility, Mobility, Sensation, FMC, Decreased knowledge of precautions Cognitive Skills: Attention, Problem Solve, Emotional, Safety Awareness, Sequencing, Memory, Learn, Thought, Understand, Perception Psychosocial Skills: Routines and Behaviors, Interpersonal Interaction   Visit Diagnosis: Other symptoms and signs involving the nervous system  Muscle weakness (generalized)  Unsteadiness on feet  Hemiplegia affecting right dominant side, unspecified etiology, unspecified hemiplegia type (HCC)  Apraxia  Visuospatial deficit    Problem List Patient Active Problem List   Diagnosis Date Noted  . Goals of care, counseling/discussion 12/16/2018  . Palliative care by specialist   . 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 Spastic hemiparesis ([place])   . Cerebral edema (HCC)   . Intracranial tumor ([place])   . Steroid-induced hyperglycemia   . Spastic hemiplegia affecting nondominant side ([place])   . Hyponatremia   . Transaminitis   . Leucocytosis   . Right spastic hemiplegia ([place]) 10/21/2018  . Dysphagia 10/21/2018  . 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 Brain mass   . ICH (intracerebral hemorrhage) ([place]) 10/13/2018   OCCUPATIONAL THERAPY DISCHARGE SUMMARY  Visits from Start of Care: 45  Current functional level related to goals / functional outcomes: See above   Remaining deficits: R spastic hemiplegia, apraxia, abnormal posture, cognitive deficits.    Education / Equipment: Land based HEP, aquatic HEP to be deferred to PT  Plan: Patient agrees to discharge.  Patient goals were met. Patient is being discharged due to meeting the stated rehab goals.  ?????     Quay Burow , OTR/L 07/13/2019, 7:06 PM  [facility] [address] [place], [place], [address] Phone: [phone]   Fax:  [phone]  Name: Gregory Moreno MRN: [id number] Date of Birth: [birth year]

## 2019-07-14 ENCOUNTER — Other Ambulatory Visit: Payer: Self-pay | Admitting: Radiation Therapy

## 2019-07-15 ENCOUNTER — Ambulatory Visit: Payer: 59 | Admitting: Physical Therapy

## 2019-07-15 ENCOUNTER — Ambulatory Visit: Payer: 59 | Admitting: Speech Pathology

## 2019-07-15 ENCOUNTER — Other Ambulatory Visit: Payer: Self-pay

## 2019-07-15 ENCOUNTER — Encounter: Payer: Self-pay | Admitting: Speech Pathology

## 2019-07-15 ENCOUNTER — Encounter: Payer: Self-pay | Admitting: Physical Therapy

## 2019-07-15 DIAGNOSIS — R2689 Other abnormalities of gait and mobility: Secondary | ICD-10-CM

## 2019-07-15 DIAGNOSIS — R482 Apraxia: Secondary | ICD-10-CM

## 2019-07-15 DIAGNOSIS — R29818 Other symptoms and signs involving the nervous system: Secondary | ICD-10-CM

## 2019-07-15 DIAGNOSIS — R2681 Unsteadiness on feet: Secondary | ICD-10-CM

## 2019-07-15 DIAGNOSIS — R41841 Cognitive communication deficit: Secondary | ICD-10-CM

## 2019-07-15 DIAGNOSIS — M6281 Muscle weakness (generalized): Secondary | ICD-10-CM

## 2019-07-15 DIAGNOSIS — R4701 Aphasia: Secondary | ICD-10-CM

## 2019-07-15 NOTE — Therapy (Addendum)
[facility] [address] [place], [place], [address] Phone: [phone]   Fax:  [phone]  Physical Therapy Treatment  Patient Details  Name: Gregory Moreno MRN: [id number] Date of Birth: July 08, 1995 Referring Provider (PT): Alger Simons, MD   Encounter Date: 07/15/2019   PT End of Session - 07/15/19 1404    Visit Number 47    Number of Visits 57    Date for PT Re-Evaluation 08/12/19    Authorization Type [employer] - 2021, no VL, $40 copay per day (regardless of how many visits)    PT Start Time 1315    PT Stop Time 1358    PT Time Calculation (min) 43 min    Equipment Utilized During Treatment Gait belt    Activity Tolerance Patient tolerated treatment well    Behavior During Therapy [facility] for tasks assessed/performed           History reviewed. No pertinent past medical history.  Past Surgical History:  Procedure Laterality Date  . CRANIOTOMY Left 10/13/2018   Procedure: LEFT CRANIOTOMY FOR TUMOR RESECTION;  Surgeon: Judith Part, MD;  Location: [facility];  Service: Neurosurgery;  Laterality: Left;    There were no vitals filed for this visit.   Subjective Assessment - 07/15/19 1319    Subjective Got his 2nd covid vaccine a couple days ago, was just feeling tired yesterday, but feeling fine now.    Patient is accompained by: Family member    How long can you walk comfortably? 100' with hemiwalker    Patient Stated Goals wants to improve his walking, get back to normal    Currently in Pain? No/denies                             [facility] Adult PT Treatment/Exercise - 07/16/19 0001      Transfers   Transfers Sit to Stand;Stand to Sit    Sit to Stand 6: Modified independent (Device/Increase time)    Stand to Sit 6: Modified independent (Device/Increase time)      Ambulation/Gait   Ambulation/Gait Yes    Ambulation/Gait Assistance 4: Min guard;4: Min assist    Ambulation/Gait Assistance  Details throughout session, verbal and manual cues for incr weight shift over to RLE for incr step length with LLE, ambulated at end of session when pt more fatigued - demonstrated incr episodes of decr R knee control     Ambulation Distance (Feet) 250 Feet    Assistive device None    Gait Pattern Step-through pattern;Decreased hip/knee flexion - right;Decreased dorsiflexion - right;Right genu recurvatum;Poor foot clearance - right    Ambulation Surface Level;Indoor      Neuro Re-ed    Neuro Re-ed Details  With elevated mat table, modified plank with BLE on floor weight and BUE on table shifting A/P with therapist assisting at pt's RUE for elbow extension and incr weight shift x5 reps, pt unable to tolerate position with RUE for incr reps. With LUE on mat table for balance support, holding mini squat position (verbal and demo cues for proper technique), stepping LLE out and in x10 reps for incr weight shift and hip ABD activation on R, progressed to use of green theraband around distal thighs for resistance x10 reps. With single UE support: x10 reps heel raises B with pt able to activate R PF, for incr push off performed 2 x 10 reps mini squats and then heel raises, seated  rest break taken between each. Standing at countertop with no UE support: stepping over yardstick, 2" obstacles (3 in total), performing with step to pattern with first clearing with RLE for incr hip/knee flexion activation and then stepping over with LLE for incr SLS and extensor control on RLE, down and back 5 reps, x2 sets, needed seated rest break in between due to fatigue, needing one episode of min A for balance.                     PT Short Term Goals - 07/08/19 1414      PT SHORT TERM GOAL #1   Title Pt will undergo further assessment of gait speed with no AD - LTG to be written as appropriate. ALL STGS DUE 07/09/19    Baseline 16.13 seconds = 2.03 ft/sec with no AD    Time 4    Period Weeks    Status Achieved     Target Date 07/09/19      PT SHORT TERM GOAL #2   Title Pt will undergo further assessment of TUG with no AD in order to determine fall risk - LTG to be written as appropriate    Baseline 15.25 seconds with no AD    Time 4    Period Weeks    Status Achieved      PT SHORT TERM GOAL #3   Title Pt will improve gait speed with small base quad cane to at least 2.1 ft/sec in order to improve community mobility.    Baseline 1.87 ft/sec, 14.1 seconds with small base quad cane = 2.32 ft/sec, 16.13 seconds = 2.03 ft/sec with no AD    Time 4    Period Weeks    Status Achieved      PT SHORT TERM GOAL #4   Title Pt will perform a curb with supervision with no AD in order to improve community mobility and independence    Baseline supervision while descending, min guard while ascending    Time 4    Period Weeks    Status Partially Met      PT SHORT TERM GOAL #5   Title Pt will ambulate at least 100' outdoors on grass with min guard with no AD in order to improve functional mobility while ambulating outdoors with family.    Baseline min guard/min A over grass surfaces with no AD    Time 4    Period Weeks    Status Partially Met             PT Long Term Goals - 07/08/19 1511      PT LONG TERM GOAL #1   Title Pt will improve gait speed with no AD to at least 2.2 ft/sec in order to demo improved gait efficiency. ALL LTGS DUE 08/10/19    Baseline 16.13 seconds = 2.03 ft/sec with no AD    Time 8    Period Weeks    Status Revised      PT LONG TERM GOAL #2   Title Pt will ambulate at least 200' over outdoor paved/gravel/mulch surfaces with supervision in order to improve functional mobility with no AD while camping/going on walks with family.    Baseline supervision over indoor suraces, min guard/min A over outdoor surfaces with no AD    Time 8    Period Weeks    Status New      PT LONG TERM GOAL #3   Title Pt will perform a  curb with MOD I with no AD in order to improve community  mobility and independence.    Baseline min guard with no AD    Time 8    Period Weeks    Status Revised      PT LONG TERM GOAL #4   Title Pt will decr TUG time to 13.5 seconds or less with no AD and with small base quad cane in order to demo decr fall risk.    Baseline 13.75 seconds with small base quad cane, 15.25 with no AD    Time 8    Period Weeks    Status Revised      PT LONG TERM GOAL #5   Title Patient will perform at least 4 steps using single handrail and step to pattern with supervision in order to safely enter/exit the RV.    Baseline 4 steps with single handrail and step to pattern - needs min A at times, overwise min guard on steps to clear RLE due to lip on step, min guard/supervision while descending    Time 8    Period Weeks    Status Revised      PT LONG TERM GOAL #6   Title Pt will improve gait speed with small base quad cane to at least 2.5 ft/sec in order to demo improved community mobility.    Baseline 2.32 ft/sec on 07/08/19    Time 8    Period Weeks    Status New                 Plan - 07/16/19 1009    Clinical Impression Statement Focus of today's skilled session was gait training with no AD and NMR for incr weight shifting towards RLE and push off activities for RLE. Pt able to demonstrate B PF activation today with use of single UE support on mat. Also focused on obstacle negotiation with no UE support and step to pattern - leading with RLE for incr hip/knee flexion and SLS on RLE. Pt demonstrates decr R knee control during gait when fatigued at end of session. Will continue to progress towards LTGs.    Personal Factors and Comorbidities --   pt ungergoing chemo/radiation   Examination-Activity Limitations Bed Mobility;Locomotion Level;Sit;Squat;Stairs;Stand;Transfers    Examination-Participation Restrictions Community Activity;School;Driving    Stability/Clinical Decision Making Evolving/Moderate complexity    Rehab Potential Good    PT Frequency  2x / week    PT Duration 8 weeks    PT Treatment/Interventions ADLs/Self Care Home Management;Therapeutic exercise;Therapeutic activities;Functional mobility training;Stair training;Gait training;DME Instruction;Balance training;Neuromuscular re-education;Patient/family education;Orthotic Fit/Training;Wheelchair mobility training;Energy conservation;Passive range of motion    PT Next Visit Plan SLS activites on RLE. continue gait outdoors over unlevel surfaces/obstacles/curb and stairs with no AD. Needs to work on closed chain glute med activation on R and hip ext.  Half kneeling/tall kneeling for core/hip strengthening and balance. picking up objects off floor.    PT Home Exercise Plan [id number]    Consulted and Agree with Plan of Care Patient;Family member/caregiver    Family Member Consulted Mom           Patient will benefit from skilled therapeutic intervention in order to improve the following deficits and impairments:  Abnormal gait, Decreased activity tolerance, Decreased balance, Decreased cognition, Decreased coordination, Decreased safety awareness, Decreased range of motion, Decreased mobility, Difficulty walking, Decreased strength, Decreased endurance, Impaired tone  Visit Diagnosis: Other symptoms and signs involving the nervous system  Muscle weakness (generalized)  Unsteadiness on feet  Other abnormalities of gait and mobility     Problem List Patient Active Problem List   Diagnosis Date Noted  . Goals of care, counseling/discussion 12/16/2018  . Palliative care by specialist   . Glioblastoma multiforme of temporal lobe ([facility]) 11/11/2018  . Spastic hemiparesis ([facility])   . Cerebral edema ([facility])   . Intracranial tumor ([place])   . Steroid-induced hyperglycemia   . Spastic hemiplegia affecting nondominant side ([facility])   . Hyponatremia   . Transaminitis   . Leucocytosis   . Right spastic hemiplegia ([facility]) 10/21/2018  . Dysphagia 10/21/2018  . 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 Brain mass   . ICH (intracerebral hemorrhage) ([facility] [facility]) 10/13/2018    Arliss Journey, PT, DPT  07/16/2019, 10:17 AM  [facility] [address] [place], [place], [address] Phone: [phone]   Fax:  [phone]  Name: Gregory Moreno MRN: [id number] Date of Birth: 1995/08/24

## 2019-07-15 NOTE — Therapy (Signed)
[facility] [address] [place], [place], [address] Phone: [phone]   Fax:  [phone]  Speech Language Pathology Treatment  Patient Details  Name: Gregory Moreno MRN: [id number] Date of Birth: 1995/09/14 Referring Provider (SLP): Alger Simons, MD   Encounter Date: 07/15/2019   End of Session - 07/15/19 1503    Visit Number 41    Number of Visits 47    Date for SLP Re-Evaluation 08/21/19    Authorization - Visit Number 41    Authorization - Number of Visits 30    SLP Start Time 3532    SLP Stop Time  9924    SLP Time Calculation (min) 41 min    Activity Tolerance Patient tolerated treatment well           History reviewed. No pertinent past medical history.  Past Surgical History:  Procedure Laterality Date  . CRANIOTOMY Left 10/13/2018   Procedure: LEFT CRANIOTOMY FOR TUMOR RESECTION;  Surgeon: Judith Part, MD;  Location: [facility];  Service: Neurosurgery;  Laterality: Left;    There were no vitals filed for this visit.   Subjective Assessment - 07/15/19 1414    Subjective "I'm going to [place]"    Currently in Pain? No/denies                 ADULT SLP TREATMENT - 07/15/19 1415      General Information   Behavior/Cognition Alert;Cooperative;Pleasant mood;Requires cueing      Cognitive-Linquistic Treatment   Treatment focused on Aphasia;Apraxia;Patient/family/caregiver education    Skilled Treatment Gregory Moreno has practiced text to speech to read e mails and articles on his phone successfully.  Gregory Moreno required frequent questioning cues to use compensations for aphasia in structured task and conversation, as well as extended time. Auditory comprehension in structured task conversations required usual repetition, pausing and slow rate      Assessment / Recommendations / [place] with current plan of care      Progression Toward Goals   Progression toward goals Progressing toward  goals            SLP Education - 07/15/19 1500    Education Details keep practicing with text to speech    Person(s) Educated Patient    Methods Explanation;Demonstration;Verbal cues    Comprehension Verbalized understanding;Returned demonstration;Verbal cues required            SLP Short Term Goals - 07/15/19 1501      SLP SHORT TERM GOAL #1   Title pt will complete aphasia assessment of auditory comprehension and verbal expression within 1-2 sessions    Status Achieved      SLP SHORT TERM GOAL #2   Title pt will demo understanding of simple commands (spoken) with 70% and occasional min cues over three sessions    Time 1    Period Weeks    Status On-going      SLP SHORT TERM GOAL #3   Title pt will answer pertinent questions with at least 3 words    Status Not Met      SLP SHORT TERM GOAL #4   Title pt will demo sustained/selective attention necessary to complete efficacious speech therapy in 6 sessions    Baseline 04-07-19, 04-27-19, 04-29-19, 06-08-19    Time 1    Period Weeks   or 8 sessions   Status On-going            SLP Long Term Goals - 07/15/19  [place] #1   Title pt will demo understanding of simple-mod complex conversation 75% of the time and rare min cues over three sessions    Baseline 02-06-19, 06-01-19, 06-03-19    Period --   or 17 sessions, for all LTGs   Status Achieved      SLP LONG TERM GOAL #2   Title pt will answer 5 questions with [facility] verbal responses, in 3 sessions    Baseline 01-27-19, 04-29-19    Status Achieved      SLP LONG TERM GOAL #3   Title pt will sustain loud /a/ with average mid 70s dB over 4 sessions    Time 5    Period Weeks    Status Deferred   due to focus on language     SLP LONG TERM GOAL #4   Title pt will engage in simple conversation for 5 minutes with average loudness mid 60s dB over 3 sessions    Time 5    Period Weeks    Status Deferred   due to focus on language     SLP LONG TERM GOAL #5    Title pt will generate 2 subjects and 3 objects for a given transitive verb with occasional min-mod A    Time 4    Period Weeks   or 47 total visits (for LTGs 5 and on); renewed 06-10-19   Status Not Met   and ongoing     SLP LONG TERM GOAL #6   Title pt will participate functionally simple-mod complex conversation for 8 minutes in 2 sessions (receptively and expressively)    Time 3    Period Weeks    Status On-going      SLP LONG TERM GOAL #7   Title pt will successfully access text-to-speech function on his phone in 3 sessions    Baseline 07/15/19    Time 4    Period Weeks    Status On-going      SLP LONG TERM GOAL #8   Title pt will demo understanding of >/= 1 sentence text messages, given modifications and compensations, in 3 sessions    Time 3    Period Weeks    Status On-going            Plan - 07/15/19 1501    Clinical Impression Statement Pt with mod Broca's aphasia, dysarthria/voice, and cognitive communication deficits.  Pt also exhibits s/sx verbal apraxia. He continues to see slow improvement in verbal expression, auditory comprehension, and cognition. Pt c/o difficlty reading - SLP assessed pt's reading and found pt mostly successful at word level but as written text lengthens pt's success in comprehension decreases. Added goal/s for reading today. See "skilled intervention" for more details on today's session. Pt would cont to benefit from skilled ST focusing on primarily receptive and expressive language but also at some point in rehab process focus on speech loudness and cognitive communication skills. Pt cont to require skilled ST focusing primarily on language skills.    Speech Therapy Frequency 2x / week    Duration --   13 weeks or 41 visits   Treatment/Interventions Environmental controls;Functional tasks;Compensatory techniques;Multimodal communcation approach;SLP instruction and feedback;Cueing hierarchy;Language facilitation;Cognitive  reorganization;Internal/external aids;Patient/family education    Potential to Achieve Goals Good    Potential Considerations Co-morbidities;Ability to learn/carryover information;Severity of impairments;Other (comment)           Patient will benefit from skilled therapeutic intervention in order  to improve the following deficits and impairments:   Aphasia  Verbal apraxia  Cognitive communication deficit    Problem List Patient Active Problem List   Diagnosis Date Noted  . Goals of care, counseling/discussion 12/16/2018  . Palliative care by specialist   . Glioblastoma multiforme of temporal lobe ([facility]) 11/11/2018  . Spastic hemiparesis ([facility])   . Cerebral edema ([facility])   . Intracranial tumor ([place])   . Steroid-induced hyperglycemia   . Spastic hemiplegia affecting nondominant side ([facility])   . Hyponatremia   . Transaminitis   . Leucocytosis   . Right spastic hemiplegia ([facility]) 10/21/2018  . Dysphagia 10/21/2018  . 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 Brain mass   . ICH (intracerebral hemorrhage) ([facility]) 10/13/2018    Jenica Costilow, Annye Rusk MS, CCC-SLP 07/15/2019, 3:04 PM  [facility] [address] [place], [place], [address] Phone: [phone]   Fax:  [phone]   Name: Gregory Moreno MRN: [id number] Date of Birth: 1995-04-13

## 2019-07-17 ENCOUNTER — Other Ambulatory Visit: Payer: 59

## 2019-07-22 ENCOUNTER — Ambulatory Visit: Payer: 59 | Attending: Gastroenterology | Admitting: Physical Therapy

## 2019-07-22 ENCOUNTER — Ambulatory Visit: Payer: 59 | Admitting: Physical Therapy

## 2019-07-22 ENCOUNTER — Other Ambulatory Visit: Payer: Self-pay

## 2019-07-22 ENCOUNTER — Ambulatory Visit: Payer: 59

## 2019-07-22 ENCOUNTER — Encounter: Payer: Self-pay | Admitting: Physical Therapy

## 2019-07-22 DIAGNOSIS — G8191 Hemiplegia, unspecified affecting right dominant side: Secondary | ICD-10-CM | POA: Insufficient documentation

## 2019-07-22 DIAGNOSIS — R482 Apraxia: Secondary | ICD-10-CM | POA: Diagnosis present

## 2019-07-22 DIAGNOSIS — R41841 Cognitive communication deficit: Secondary | ICD-10-CM | POA: Insufficient documentation

## 2019-07-22 DIAGNOSIS — R2689 Other abnormalities of gait and mobility: Secondary | ICD-10-CM | POA: Diagnosis present

## 2019-07-22 DIAGNOSIS — R2681 Unsteadiness on feet: Secondary | ICD-10-CM | POA: Diagnosis present

## 2019-07-22 DIAGNOSIS — R4701 Aphasia: Secondary | ICD-10-CM | POA: Diagnosis present

## 2019-07-22 DIAGNOSIS — M6281 Muscle weakness (generalized): Secondary | ICD-10-CM | POA: Insufficient documentation

## 2019-07-22 DIAGNOSIS — R471 Dysarthria and anarthria: Secondary | ICD-10-CM | POA: Diagnosis present

## 2019-07-22 DIAGNOSIS — R29818 Other symptoms and signs involving the nervous system: Secondary | ICD-10-CM | POA: Insufficient documentation

## 2019-07-23 ENCOUNTER — Ambulatory Visit
Admission: RE | Admit: 2019-07-23 | Discharge: 2019-07-23 | Disposition: A | Discharge status: Home / Self Care | Payer: 59 | Source: Ambulatory Visit | Attending: Internal Medicine | Admitting: Internal Medicine

## 2019-07-23 DIAGNOSIS — C712 Malignant neoplasm of temporal lobe: Secondary | ICD-10-CM

## 2019-07-23 MED ORDER — GADOBENATE DIMEGLUMINE 529 MG/ML IV SOLN
15.0000 mL | Freq: Once | INTRAVENOUS | Status: AC | PRN
  Administered 2019-07-23: 15 mL via INTRAVENOUS

## 2019-07-23 NOTE — Therapy (Addendum)
[facility] [address] [place], [place], [address] Phone: [phone]   Fax:  [phone]  Physical Therapy Treatment  Patient Details  Name: Gregory Moreno MRN: [id number] Date of Birth: May 21, 1995 Referring Provider (PT): Alger Simons, MD   Encounter Date: 07/22/2019   PT End of Session - 07/22/19 1318    Visit Number 48    Number of Visits 57    Date for PT Re-Evaluation 08/12/19    Authorization Type [employer] - 2021, no VL, $40 copay per day (regardless of how many visits)    PT Start Time 1228    PT Stop Time 1315    PT Time Calculation (min) 47 min    Equipment Utilized During Treatment Gait belt    Activity Tolerance Patient tolerated treatment well    Behavior During Therapy [facility] for tasks assessed/performed           History reviewed. No pertinent past medical history.  Past Surgical History:  Procedure Laterality Date  . CRANIOTOMY Left 10/13/2018   Procedure: LEFT CRANIOTOMY FOR TUMOR RESECTION;  Surgeon: Judith Part, MD;  Location: [facility];  Service: Neurosurgery;  Laterality: Left;    There were no vitals filed for this visit.   Subjective Assessment - 07/22/19 1232    [place] this weekend.    Patient is accompained by: Family member    How long can you walk comfortably? 100' with hemiwalker    Patient Stated Goals wants to improve his walking, get back to normal    Currently in Pain? No/denies                       07/26/19 0001  Ambulation/Gait  Ambulation/Gait Yes  Ambulation/Gait Assistance 4: Min guard;4: Min assist  Ambulation/Gait Assistance Details clinic distances between activities with no AD, with manual assist of weight shifting towards RLE for incr step length with LLE, pt with incr episodes of genu recurvatum when more fatigued at end of session  Assistive device None  Gait Pattern Step-through  pattern;Decreased hip/knee flexion - right;Decreased dorsiflexion - right;Right genu recurvatum;Poor foot clearance - right  Ambulation Surface Level;Indoor  Neuro Re-ed   Neuro Re-ed Details  Pt performing stand pivot transfer from mat table to face mat, needing cues and min guard for proper sequencing to come down into half kneel > tall kneel position with LUE support on mat table. Performed half kneeling with LUE support and facing mirror for visual feedback, with RLE anteriorly performed holding balance with cues for weight shift and glute activation 2 x 10 seconds and performed x10 reps anterior/posterior weight shifting. Pt needing assist bringing RLE back into tall kneel position. Then with LLE anteriorly, focused on glute and trunk extensor activation with use of mirror as visual cue for midline, attempting to hold balance without UE support performing x10 reps of approx.Marland Kitchen 5- 10 seconds each, then with single UE support for dynamic balance performed 2 x 5 reps head turns, then performing weight shifting A/P x10 reps with visual and demo cues for proper technique. Pt needing min A to get out of half kneel position and push up back to mat using LLE and LUE.            07/26/19 0001  Balance Exercises: Standing  Other Standing Exercises Standing on thicker blue foam with feet apart eyes closed 8 x  15-20 second reps with  tactile and verbal cues for R quad activation, with feet close together performing 2 x 10 reps head turns, 2 x 10 reps head nods with eyes open, modified tandem stance with RLE posteriorly for incr weight shifting with eyes closed 2 x 15 seconds. On red compliant surface performed three 2" or below obstacles with focus on clearing with RLE and step to pattern for incr hip/knee flexion activation and SLS. Down and back In // bars x5 reps, needing intermittent UE support                 PT Short Term Goals - 07/08/19 1414      PT SHORT TERM GOAL #1   Title Pt will  undergo further assessment of gait speed with no AD - LTG to be written as appropriate. ALL STGS DUE 07/09/19    Baseline 16.13 seconds = 2.03 ft/sec with no AD    Time 4    Period Weeks    Status Achieved    Target Date 07/09/19      PT SHORT TERM GOAL #2   Title Pt will undergo further assessment of TUG with no AD in order to determine fall risk - LTG to be written as appropriate    Baseline 15.25 seconds with no AD    Time 4    Period Weeks    Status Achieved      PT SHORT TERM GOAL #3   Title Pt will improve gait speed with small base quad cane to at least 2.1 ft/sec in order to improve community mobility.    Baseline 1.87 ft/sec, 14.1 seconds with small base quad cane = 2.32 ft/sec, 16.13 seconds = 2.03 ft/sec with no AD    Time 4    Period Weeks    Status Achieved      PT SHORT TERM GOAL #4   Title Pt will perform a curb with supervision with no AD in order to improve community mobility and independence    Baseline supervision while descending, min guard while ascending    Time 4    Period Weeks    Status Partially Met      PT SHORT TERM GOAL #5   Title Pt will ambulate at least 100' outdoors on grass with min guard with no AD in order to improve functional mobility while ambulating outdoors with family.    Baseline min guard/min A over grass surfaces with no AD    Time 4    Period Weeks    Status Partially Met             PT Long Term Goals - 07/08/19 1511      PT LONG TERM GOAL #1   Title Pt will improve gait speed with no AD to at least 2.2 ft/sec in order to demo improved gait efficiency. ALL LTGS DUE 08/10/19    Baseline 16.13 seconds = 2.03 ft/sec with no AD    Time 8    Period Weeks    Status Revised      PT LONG TERM GOAL #2   Title Pt will ambulate at least 200' over outdoor paved/gravel/mulch surfaces with supervision in order to improve functional mobility with no AD while camping/going on walks with family.    Baseline supervision over indoor  suraces, min guard/min A over outdoor surfaces with no AD    Time 8    Period Weeks    Status New      PT LONG TERM GOAL #  3   Title Pt will perform a curb with MOD I with no AD in order to improve community mobility and independence.    Baseline min guard with no AD    Time 8    Period Weeks    Status Revised      PT LONG TERM GOAL #4   Title Pt will decr TUG time to 13.5 seconds or less with no AD and with small base quad cane in order to demo decr fall risk.    Baseline 13.75 seconds with small base quad cane, 15.25 with no AD    Time 8    Period Weeks    Status Revised      PT LONG TERM GOAL #5   Title Patient will perform at least 4 steps using single handrail and step to pattern with supervision in order to safely enter/exit the RV.    Baseline 4 steps with single handrail and step to pattern - needs min A at times, overwise min guard on steps to clear RLE due to lip on step, min guard/supervision while descending    Time 8    Period Weeks    Status Revised      PT LONG TERM GOAL #6   Title Pt will improve gait speed with small base quad cane to at least 2.5 ft/sec in order to demo improved community mobility.    Baseline 2.32 ft/sec on 07/08/19    Time 8    Period Weeks    Status New                07/26/19 1935  Plan  Clinical Impression Statement Focus of today's skilled session was balance strategies on compliant surfaces and strengthening/NMR in half kneeling position. Pt most challenged in half kneeling position with RLE posterior, able to let go for periods of approx. 5-10 seconds to maintain balance before needing UE support, pt needing cues for glute activation. Pt demonstrating incr episodes of R genu recurvatum during SLS activities at end of session when more fatigued. Will continue to progress towards LTGs.  Personal Factors and Comorbidities  (pt ungergoing chemo/radiation)  Examination-Activity Limitations Bed Mobility;Locomotion  Level;Sit;Squat;Stairs;Stand;Transfers  Examination-Participation Restrictions Community Activity;School;Driving  Pt will benefit from skilled therapeutic intervention in order to improve on the following deficits Abnormal gait;Decreased activity tolerance;Decreased balance;Decreased cognition;Decreased coordination;Decreased safety awareness;Decreased range of motion;Decreased mobility;Difficulty walking;Decreased strength;Decreased endurance;Impaired tone  Stability/Clinical Decision Making Evolving/Moderate complexity  Rehab Potential Good  PT Frequency 2x / week  PT Duration 8 weeks  PT Treatment/Interventions ADLs/Self Care Home Management;Therapeutic exercise;Therapeutic activities;Functional mobility training;Stair training;Gait training;DME Instruction;Balance training;Neuromuscular re-education;Patient/family education;Orthotic Fit/Training;Wheelchair mobility training;Energy conservation;Passive range of motion  PT Next Visit Plan dynamic gait/ taller step ups with RLE. SLS activites on RLE. continue gait outdoors over unlevel surfaces/obstacles/curb and stairs with no AD. Needs to work on closed chain glute med activation on R and hip ext.  Half kneeling/tall kneeling for core/hip strengthening and balance. picking up objects off floor.  PT Home Exercise Plan [id number]  Consulted and Agree with Plan of Care Patient;Family member/caregiver  Family Member Consulted Mom       Patient will benefit from skilled therapeutic intervention in order to improve the following deficits and impairments:     Visit Diagnosis: Other symptoms and signs involving the nervous system  Muscle weakness (generalized)  Unsteadiness on feet  Other abnormalities of gait and mobility     Problem List Patient Active Problem List   Diagnosis Date Noted  .  Goals of care, counseling/discussion 12/16/2018  . Palliative care by specialist   . Glioblastoma multiforme of temporal lobe ([facility]) 11/11/2018  .  Spastic hemiparesis ([facility])   . Cerebral edema ([facility])   . Intracranial tumor ([facility])   . Steroid-induced hyperglycemia   . Spastic hemiplegia affecting nondominant side ([facility])   . Hyponatremia   . Transaminitis   . Leucocytosis   . Right spastic hemiplegia ([facility]) 10/21/2018  . Dysphagia 10/21/2018  . 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 Brain mass   . ICH (intracerebral hemorrhage) ([facility]) 10/13/2018    Arliss Journey, PT, DPT  07/23/2019, 12:22 PM  [facility] [address] [place], [place], [address] Phone: [phone]   Fax:  [phone]  Name: KAIEA ESSELMAN MRN: [id number] Date of Birth: 1995/02/20

## 2019-07-24 ENCOUNTER — Telehealth: Payer: Self-pay

## 2019-07-24 NOTE — Telephone Encounter (Signed)
Received a call from [facility] calling report on patient's MRI done today. Copy of report printed and given to Dr. [name].

## 2019-07-27 ENCOUNTER — Ambulatory Visit: Payer: 59 | Admitting: Physical Therapy

## 2019-07-27 ENCOUNTER — Inpatient Hospital Stay: Payer: 59 | Attending: Internal Medicine

## 2019-07-27 ENCOUNTER — Other Ambulatory Visit: Payer: Self-pay

## 2019-07-27 ENCOUNTER — Ambulatory Visit: Payer: 59

## 2019-07-27 DIAGNOSIS — R2689 Other abnormalities of gait and mobility: Secondary | ICD-10-CM

## 2019-07-27 DIAGNOSIS — R471 Dysarthria and anarthria: Secondary | ICD-10-CM

## 2019-07-27 DIAGNOSIS — R482 Apraxia: Secondary | ICD-10-CM

## 2019-07-27 DIAGNOSIS — R2681 Unsteadiness on feet: Secondary | ICD-10-CM

## 2019-07-27 DIAGNOSIS — M6281 Muscle weakness (generalized): Secondary | ICD-10-CM

## 2019-07-27 DIAGNOSIS — Z923 Personal history of irradiation: Secondary | ICD-10-CM | POA: Insufficient documentation

## 2019-07-27 DIAGNOSIS — Z9221 Personal history of antineoplastic chemotherapy: Secondary | ICD-10-CM | POA: Insufficient documentation

## 2019-07-27 DIAGNOSIS — R29818 Other symptoms and signs involving the nervous system: Secondary | ICD-10-CM | POA: Diagnosis not present

## 2019-07-27 DIAGNOSIS — Z79899 Other long term (current) drug therapy: Secondary | ICD-10-CM | POA: Insufficient documentation

## 2019-07-27 DIAGNOSIS — R41841 Cognitive communication deficit: Secondary | ICD-10-CM

## 2019-07-27 DIAGNOSIS — C712 Malignant neoplasm of temporal lobe: Secondary | ICD-10-CM | POA: Insufficient documentation

## 2019-07-27 DIAGNOSIS — R4701 Aphasia: Secondary | ICD-10-CM

## 2019-07-27 NOTE — Therapy (Signed)
[facility] [address] [place], [place], [address] Phone: [phone]   Fax:  [phone]  Speech Language Pathology Treatment  Patient Details  Name: Gregory Moreno MRN: [id number] Date of Birth: 1995/11/04 Referring Provider (SLP): Alger Simons, MD   Encounter Date: 07/27/2019   End of Session - 07/27/19 1723    Visit Number 42    Number of Visits 47    Date for SLP Re-Evaluation 08/21/19    SLP Start Time 1406    SLP Stop Time  1448    SLP Time Calculation (min) 42 min    Activity Tolerance Patient tolerated treatment well           History reviewed. No pertinent past medical history.  Past Surgical History:  Procedure Laterality Date  . CRANIOTOMY Left 10/13/2018   Procedure: LEFT CRANIOTOMY FOR TUMOR RESECTION;  Surgeon: Judith Part, MD;  Location: [facility];  Service: Neurosurgery;  Laterality: Left;    There were no vitals filed for this visit.   Subjective Assessment - 07/27/19 1429    Subjective "It's good for [employer])."    Patient is accompained by: --   father [place]   Currently in Pain? No/denies                 ADULT SLP TREATMENT - 07/27/19 1432      General Information   Behavior/Cognition Alert;Cooperative;Pleasant mood;Requires cueing      Treatment Provided   Treatment provided Cognitive-Linquistic      Cognitive-Linquistic Treatment   Treatment focused on Aphasia;Apraxia;Cognition    Skilled Treatment Pt showed SLP his text to speech option on his phone upon SLP request - pt was independent with this feature, but noted to have 1.0x speed and did not comprehend all details. When SLP asked, pt showed SLP how to slow speed. SLP enocuaged pt to slow speed furhter if pt still not comprehnding at 0.7x speed. Pt denied he had difficulty at 1.0 speed but answered 2/4 questions correctly about the article he had read to him. SLP reiterated need to slow speed to, at  most, x0.7. SLP told pt/father this would be a good compensation for pt to cont to use in the future due to unsure how much more written language comprehension pt will have return. SLP targeted compensations for anomia working with pt on describing items by attributes with usual mod-max A; occasionally necessary to rephrase or repeat comments or questions for patient.      Assessment / Recommendations / Plan   Plan Continue with current plan of care      Progression Toward Goals   Progression toward goals Progressing toward goals            SLP Education - 07/27/19 1722    Education Details need to have speed for text to speech be at most x0.7, will need to use this going forward    Person(s) Educated Patient;Parent(s)    Methods Explanation    Comprehension Verbalized understanding            SLP Short Term Goals - 07/15/19 1501      SLP SHORT TERM GOAL #1   Title pt will complete aphasia assessment of auditory comprehension and verbal expression within 1-2 sessions    Status Achieved      SLP SHORT TERM GOAL #2   Title pt will demo understanding of simple commands (spoken) with 70% and occasional min cues over three sessions  Time 1    Period Weeks    Status On-going      SLP SHORT TERM GOAL #3   Title pt will answer pertinent questions with at least 3 words    Status Not Met      SLP SHORT TERM GOAL #4   Title pt will demo sustained/selective attention necessary to complete efficacious speech therapy in 6 sessions    Baseline 04-07-19, 04-27-19, 04-29-19, 06-08-19    Time 1    Period Weeks   or 8 sessions   Status On-going            SLP Long Term Goals - 07/27/19 1725      SLP LONG TERM GOAL #1   Title pt will demo understanding of simple-mod complex conversation 75% of the time and rare min cues over three sessions    Baseline 02-06-19, 06-01-19, 06-03-19    Period --   or 17 sessions, for all LTGs   Status Achieved      SLP LONG TERM GOAL #2   Title pt will  answer 5 questions with [facility] verbal responses, in 3 sessions    Baseline 01-27-19, 04-29-19    Status Achieved      SLP LONG TERM GOAL #3   Title pt will sustain loud /a/ with average mid 70s dB over 4 sessions    Status Deferred   due to focus on language     SLP LONG TERM GOAL #4   Title pt will engage in simple conversation for 5 minutes with average loudness mid 60s dB over 3 sessions    Status Deferred   due to focus on language     SLP LONG TERM GOAL #5   Title pt will generate 2 subjects and 3 objects for a given transitive verb with occasional min-mod A    Time 4    Period Weeks   or 47 total visits (for LTGs 5 and on); renewed 06-10-19   Status Not Met   and ongoing     SLP LONG TERM GOAL #6   Title pt will participate functionally simple-mod complex conversation for 8 minutes in 2 sessions (receptively and expressively)    Time 2    Period Weeks    Status On-going      SLP LONG TERM GOAL #7   Title pt will successfully access text-to-speech function on his phone in 3 sessions    Baseline 07/15/19, 07-27-19    Time 3    Period Weeks    Status On-going      SLP LONG TERM GOAL #8   Title pt will demo understanding of >/= 1 sentence text messages, given modifications and compensations, in 3 sessions    Time 2    Period Weeks    Status On-going            Plan - 07/27/19 1723    Clinical Impression Statement Pt with mod Broca's aphasia, dysarthria/voice, and cognitive communication deficits.  Pt also exhibits s/sx verbal apraxia. Since SLP last saw pt, he demos some minor improvement in verbal expression - less anomia noted. See "skilled intervention" for more details on today's session. Pt would cont to benefit from skilled ST focusing on primarily receptive and expressive language but also at some point in rehab process focus on speech loudness and cognitive communication skills. Pt cont to require skilled ST focusing primarily on language skills.    Speech Therapy  Frequency 2x / week  Duration --   13 weeks or 41 visits   Treatment/Interventions Environmental controls;Functional tasks;Compensatory techniques;Multimodal communcation approach;SLP instruction and feedback;Cueing hierarchy;Language facilitation;Cognitive reorganization;Internal/external aids;Patient/family education    Potential to Achieve Goals Good    Potential Considerations Co-morbidities;Ability to learn/carryover information;Severity of impairments;Other (comment)           Patient will benefit from skilled therapeutic intervention in order to improve the following deficits and impairments:   Aphasia  Verbal apraxia  Cognitive communication deficit  Dysarthria and anarthria    Problem List Patient Active Problem List   Diagnosis Date Noted  . Goals of care, counseling/discussion 12/16/2018  . Palliative care by specialist   . Glioblastoma multiforme of temporal lobe ([facility]) 11/11/2018  . Spastic hemiparesis ([facility])   . Cerebral edema ([facility])   . Intracranial tumor ([facility])   . Steroid-induced hyperglycemia   . Spastic hemiplegia affecting nondominant side ([facility])   . Hyponatremia   . Transaminitis   . Leucocytosis   . Right spastic hemiplegia ([facility]) 10/21/2018  . Dysphagia 10/21/2018  . 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 Brain mass   . ICH (intracerebral hemorrhage) ([facility]) 10/13/2018    [facility] ,[place], [place]  07/27/2019, 5:26 PM  [facility] [address] [place], [place], [address] Phone: [phone]   Fax:  [phone]   Name: HEZAKIAH CHAMPEAU MRN: [id number] Date of Birth: Aug 03, 1995

## 2019-07-27 NOTE — Therapy (Signed)
[facility] [address] [place], [place], [address] Phone: [phone]   Fax:  [phone]  Physical Therapy Treatment  Patient Details  Name: Gregory Moreno MRN: [id number] Date of Birth: May 28, 1995 Referring Provider (PT): Alger Simons, MD   Encounter Date: 07/27/2019   PT End of Session - 07/27/19 1835    Visit Number 49    Number of Visits 57    Date for PT Re-Evaluation 08/12/19    Authorization Type U[employer] - 2021, no VL, $40 copay per day (regardless of how many visits)    PT Start Time 1315    PT Stop Time 1400    PT Time Calculation (min) 45 min    Equipment Utilized During Treatment Gait belt    Activity Tolerance Patient tolerated treatment well    Behavior During Therapy [facility] for tasks assessed/performed           No past medical history on file.  Past Surgical History:  Procedure Laterality Date  . CRANIOTOMY Left 10/13/2018   Procedure: LEFT CRANIOTOMY FOR TUMOR RESECTION;  Surgeon: Judith Part, MD;  Location: [facility];  Service: Neurosurgery;  Laterality: Left;    There were no vitals filed for this visit.   Subjective Assessment - 07/27/19 1319    Subjective Went to [employer] this past weekend. Doing well. Going to see Dr. [name] tomorrow.    Patient is accompained by: Family member    How long can you walk comfortably? 100' with hemiwalker    Patient Stated Goals wants to improve his walking, get back to normal    Currently in Pain? No/denies                             [facility] Adult PT Treatment/Exercise - 07/27/19 0001      Ambulation/Gait   Ambulation/Gait Yes    Ambulation/Gait Assistance 4: Min guard;4: Min assist    Ambulation/Gait Assistance Details clinic distances with no AD, intermittent min A and tactile cues for incr weight shift towards RLE    Assistive device None    Gait Pattern Step-through pattern;Decreased hip/knee flexion - right;Decreased  dorsiflexion - right;Right genu recurvatum;Poor foot clearance - right    Ambulation Surface Level;Indoor      Neuro Re-ed    Neuro Re-ed Details  On rockerboard in M/L direction on 1" foam for compliant surface: M/L weight shifting with no UE support x10 reps and progressing to weight shifting with eyes closed with min A x5 reps and intermittent UE support for balance. Performed maintaining board steady with eyes closed: 5 x 10 second reps. With eyes open on board: x10 reps head turns, x10 reps head nods. Throughout pt needing tactile and verbal cues for R quad activation and weight shifting.                Balance Exercises - 07/27/19 0001      Balance Exercises: Standing   SLS Eyes open;Solid surface;Limitations    SLS Limitations with no UE support - x12 reps stepping LLE over 4" beam for SLS on RLE and closed chain hip ABD strengthening, cues for "soft" landing with LLE for more controlled SLS, pt improving with incr reps    Step Ups 6 inch;Forward;Lateral;Limitations    Step Ups Limitations keeping RLE on step, performed x10 reps of each - cues for eccentric control. performing with no UE support    Other Standing Exercises Standing  on foam pad: reaching superiorly/laterally/and forward outside of BOS to grab cone from pt's dad with pt's LUE and then placing it on table to pt's R for incr weight shift towards R, 2 x 10 reps - min guard/intermittent min A for balance and tactile/verbal cues for R quad activation. Performed an additional x12 reps of pt reaching posteriorly and posteriorly/laterally to grab cone from lower where pt needing to perform and hold a mini squat each time, min guard/min A for balance, cues for widened BOS at times.              PT Education - 07/27/19 1838    Education Details pt wearing new shoes today - discussed going to [employer] for leather toe cap if this is more permanent of a shoe due to pt demonstrating decr foot clearance at times during gait     Person(s) Educated Patient;Parent(s)    Methods Explanation    Comprehension Verbalized understanding            PT Short Term Goals - 07/08/19 1414      PT SHORT TERM GOAL #1   Title Pt will undergo further assessment of gait speed with no AD - LTG to be written as appropriate. ALL STGS DUE 07/09/19    Baseline 16.13 seconds = 2.03 ft/sec with no AD    Time 4    Period Weeks    Status Achieved    Target Date 07/09/19      PT SHORT TERM GOAL #2   Title Pt will undergo further assessment of TUG with no AD in order to determine fall risk - LTG to be written as appropriate    Baseline 15.25 seconds with no AD    Time 4    Period Weeks    Status Achieved      PT SHORT TERM GOAL #3   Title Pt will improve gait speed with small base quad cane to at least 2.1 ft/sec in order to improve community mobility.    Baseline 1.87 ft/sec, 14.1 seconds with small base quad cane = 2.32 ft/sec, 16.13 seconds = 2.03 ft/sec with no AD    Time 4    Period Weeks    Status Achieved      PT SHORT TERM GOAL #4   Title Pt will perform a curb with supervision with no AD in order to improve community mobility and independence    Baseline supervision while descending, min guard while ascending    Time 4    Period Weeks    Status Partially Met      PT SHORT TERM GOAL #5   Title Pt will ambulate at least 100' outdoors on grass with min guard with no AD in order to improve functional mobility while ambulating outdoors with family.    Baseline min guard/min A over grass surfaces with no AD    Time 4    Period Weeks    Status Partially Met             PT Long Term Goals - 07/08/19 1511      PT LONG TERM GOAL #1   Title Pt will improve gait speed with no AD to at least 2.2 ft/sec in order to demo improved gait efficiency. ALL LTGS DUE 08/10/19    Baseline 16.13 seconds = 2.03 ft/sec with no AD    Time 8    Period Weeks    Status Revised      PT LONG TERM  GOAL #2   Title Pt will ambulate at  least 200' over outdoor paved/gravel/mulch surfaces with supervision in order to improve functional mobility with no AD while camping/going on walks with family.    Baseline supervision over indoor suraces, min guard/min A over outdoor surfaces with no AD    Time 8    Period Weeks    Status New      PT LONG TERM GOAL #3   Title Pt will perform a curb with MOD I with no AD in order to improve community mobility and independence.    Baseline min guard with no AD    Time 8    Period Weeks    Status Revised      PT LONG TERM GOAL #4   Title Pt will decr TUG time to 13.5 seconds or less with no AD and with small base quad cane in order to demo decr fall risk.    Baseline 13.75 seconds with small base quad cane, 15.25 with no AD    Time 8    Period Weeks    Status Revised      PT LONG TERM GOAL #5   Title Patient will perform at least 4 steps using single handrail and step to pattern with supervision in order to safely enter/exit the RV.    Baseline 4 steps with single handrail and step to pattern - needs min A at times, overwise min guard on steps to clear RLE due to lip on step, min guard/supervision while descending    Time 8    Period Weeks    Status Revised      PT LONG TERM GOAL #6   Title Pt will improve gait speed with small base quad cane to at least 2.5 ft/sec in order to demo improved community mobility.    Baseline 2.32 ft/sec on 07/08/19    Time 8    Period Weeks    Status New                 Plan - 07/27/19 1835    Clinical Impression Statement Focus of today's skilled session was RLE strengthening, balance strategies on compliant surfaces, and SLS activities on RLE. Pt tolerated session well - had incr RLE tremors after prolonged standing activities/mini squats on blue foam. With incr reps, pt able to demonstrate improved control with RLE and needing no UE support with stepping LLE over 4" obstacle and then back to midline. Will continue to progress towards LTGs.      Personal Factors and Comorbidities --   pt ungergoing chemo/radiation   Examination-Activity Limitations Bed Mobility;Locomotion Level;Sit;Squat;Stairs;Stand;Transfers    Examination-Participation Restrictions Community Activity;School;Driving    Stability/Clinical Decision Making Evolving/Moderate complexity    Rehab Potential Good    PT Frequency 2x / week    PT Duration 8 weeks    PT Treatment/Interventions ADLs/Self Care Home Management;Therapeutic exercise;Therapeutic activities;Functional mobility training;Stair training;Gait training;DME Instruction;Balance training;Neuromuscular re-education;Patient/family education;Orthotic Fit/Training;Wheelchair mobility training;Energy conservation;Passive range of motion    PT Next Visit Plan dynamic gait/ taller step ups with RLE/tall kneeling walking? SLS activites on RLE. continue gait outdoors over unlevel surfaces/obstacles/curb and stairs with no AD. Needs to work on closed chain glute med activation on R and hip ext.  Half kneeling/tall kneeling for core/hip strengthening and balance. picking up objects off floor.    PT Home Exercise Plan [id number]    Consulted and Agree with Plan of Care Patient;Family member/caregiver    Family Member Consulted Mom  Patient will benefit from skilled therapeutic intervention in order to improve the following deficits and impairments:  Abnormal gait, Decreased activity tolerance, Decreased balance, Decreased cognition, Decreased coordination, Decreased safety awareness, Decreased range of motion, Decreased mobility, Difficulty walking, Decreased strength, Decreased endurance, Impaired tone  Visit Diagnosis: Other symptoms and signs involving the nervous system  Muscle weakness (generalized)  Unsteadiness on feet  Other abnormalities of gait and mobility     Problem List Patient Active Problem List   Diagnosis Date Noted  . Goals of care, counseling/discussion 12/16/2018  . Palliative  care by specialist   . Glioblastoma multiforme of temporal lobe ([facility]) 11/11/2018  . Spastic hemiparesis ([facility])   . Cerebral edema ([facility])   . Intracranial tumor ([facility])   . Steroid-induced hyperglycemia   . Spastic hemiplegia affecting nondominant side ([facility])   . Hyponatremia   . Transaminitis   . Leucocytosis   . Right spastic hemiplegia ([facility]) 10/21/2018  . Dysphagia 10/21/2018  . 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 Brain mass   . ICH (intracerebral hemorrhage) ([facility]) 10/13/2018    Arliss Journey , PT, DPT  07/27/2019, 6:44 PM  [facility] [address] [place], [place], [address] Phone: [phone]   Fax:  [phone]  Name: ZAYLAN KISSOON MRN: [id number] Date of Birth: Mar 20, 1995

## 2019-07-28 ENCOUNTER — Other Ambulatory Visit: Payer: Self-pay

## 2019-07-28 ENCOUNTER — Inpatient Hospital Stay: Payer: 59 | Admitting: Internal Medicine

## 2019-07-28 ENCOUNTER — Inpatient Hospital Stay: Payer: 59

## 2019-07-28 ENCOUNTER — Other Ambulatory Visit: Payer: Self-pay | Admitting: Internal Medicine

## 2019-07-28 VITALS — BP 138/81 | HR 86 | Temp 98.1°F | Resp 18 | Ht 69.0 in | Wt 186.5 lb

## 2019-07-28 DIAGNOSIS — C712 Malignant neoplasm of temporal lobe: Secondary | ICD-10-CM

## 2019-07-28 DIAGNOSIS — G8110 Spastic hemiplegia affecting unspecified side: Secondary | ICD-10-CM | POA: Diagnosis not present

## 2019-07-28 DIAGNOSIS — Z79899 Other long term (current) drug therapy: Secondary | ICD-10-CM | POA: Diagnosis not present

## 2019-07-28 DIAGNOSIS — Z923 Personal history of irradiation: Secondary | ICD-10-CM | POA: Diagnosis not present

## 2019-07-28 DIAGNOSIS — Z9221 Personal history of antineoplastic chemotherapy: Secondary | ICD-10-CM | POA: Diagnosis not present

## 2019-07-28 LAB — CMP (CANCER CENTER ONLY)
ALT: 16 U/L (ref 0–44)
AST: 15 U/L (ref 15–41)
Albumin: 3.8 g/dL (ref 3.5–5.0)
Alkaline Phosphatase: 82 U/L (ref 38–126)
Anion gap: 9 (ref 5–15)
BUN: 13 mg/dL (ref 6–20)
CO2: 27 mmol/L (ref 22–32)
Calcium: 10 mg/dL (ref 8.9–10.3)
Chloride: 103 mmol/L (ref 98–111)
Creatinine: 0.89 mg/dL (ref 0.61–1.24)
GFR, Est AFR Am: >60 mL/min (ref >60)
GFR, Estimated: >60 mL/min (ref >60)
Glucose, Bld: 101 mg/dL — ABNORMAL HIGH (ref 70–99)
Potassium: 3.9 mmol/L (ref 3.5–5.1)
Sodium: 139 mmol/L (ref 135–145)
Total Bilirubin: 0.5 mg/dL (ref 0.3–1.2)
Total Protein: 8.1 g/dL (ref 6.5–8.1)

## 2019-07-28 LAB — CBC WITH DIFFERENTIAL (CANCER CENTER ONLY)
Abs Immature Granulocytes: 0.02 10*3/uL (ref 0.00–0.07)
Basophils Absolute: 0 10*3/uL (ref 0.0–0.1)
Basophils Relative: 0 %
Eosinophils Absolute: 0.1 10*3/uL (ref 0.0–0.5)
Eosinophils Relative: 2 %
HCT: 41.2 % (ref 39.0–52.0)
Hemoglobin: 13.5 g/dL (ref 13.0–17.0)
Immature Granulocytes: 0 %
Lymphocytes Relative: 13 %
Lymphs Abs: 0.7 10*3/uL (ref 0.7–4.0)
MCH: 30.4 pg (ref 26.0–34.0)
MCHC: 32.8 g/dL (ref 30.0–36.0)
MCV: 92.8 fL (ref 80.0–100.0)
Monocytes Absolute: 0.5 10*3/uL (ref 0.1–1.0)
Monocytes Relative: 8 %
Neutro Abs: 4.3 10*3/uL (ref 1.7–7.7)
Neutrophils Relative %: 77 %
Platelet Count: 135 10*3/uL — ABNORMAL LOW (ref 150–400)
RBC: 4.44 MIL/uL (ref 4.22–5.81)
RDW: 14.9 % (ref 11.5–15.5)
WBC Count: 5.6 10*3/uL (ref 4.0–10.5)
nRBC: 0 % (ref 0.0–0.2)

## 2019-07-28 NOTE — Telephone Encounter (Signed)
Rx request 

## 2019-07-28 NOTE — Progress Notes (Signed)
[facility] [place], [place] [address] [phone]   Interval Evaluation  Date of Service: 07/28/19 Patient Name: Gregory Moreno Patient MRN: [id number] Patient DOB: 1995-06-02 Provider: Ventura Sellers, MD  Identifying Statement:  Gregory Moreno is a 24 y.o. male with left temporal glioblastoma   Referring Provider: [facility] [address] [place],  [place] [address]  Oncologic History: Oncology History  Glioblastoma multiforme of temporal lobe ([facility])  10/13/2018 Surgery   Craniotomy, debulking resection and hematoma evacuation with Dr. Zada Finders.   12/02/2018 - 01/13/2019 Radiation Therapy   IMRT and concurrent Temodar 57mm2   02/09/2019 -  Chemotherapy   The patient had temozolomide (TEMODAR) 100 MG capsule, 150 mg/m2/day = 300 mg, Oral, Daily, 1 of 1 cycle, Start date: 06/30/2019, End date: -- Dose modification: 150 mg/m2/day (original dose 150 mg/m2/day, Cycle 1), 200 mg/m2/day (original dose 150 mg/m2/day, Cycle 1)  for chemotherapy treatment.      Biomarkers:  MGMT Methylated.  IDH 1/2 Wild type.  EGFR Not expressed  TERT Unmutated   Interval History:  Gregory IQBALpresents today for follow up following after completing cycle #5 adjuvant Temodar. Continues to demonstrate modest improvements right sided motor function, able to ambulate short distances without the cane.  Remains with dense left visual field impairment.  Language continues to have expression limitations, but making some gains there as well.  No issues with tolerating chemo, aside from one episode of small amount of vomiting.  Medications: Current Outpatient Medications on File Prior to Visit  Medication Sig Dispense Refill  . camphor-menthol (SARNA) lotion Apply 1 application topically as needed for itching. (Patient not taking: Reported on 06/30/2019) 222 mL 0  . escitalopram (LEXAPRO) 5 MG tablet TAKE 1  TABLET BY MOUTH EVERYDAY AT BEDTIME 90 tablet 1  . ondansetron (ZOFRAN) 4 MG tablet Take 1 tablet (4 mg total) by mouth every 8 (eight) hours as needed for nausea or vomiting. (Patient not taking: Reported on 06/30/2019) 30 tablet 1  . ondansetron (ZOFRAN) 8 MG tablet TAKE 1 TABLET BY MOUTH TWICE DAILY AS NEEDED FOR NAUSEA AND VOMITING. MAY TAKE 30-60 MINUTES PRIOR TO TEMODAR ADMINISTRATION IF NAUSEA/VOMITING OCCURS. (Patient not taking: Reported on 06/30/2019) 30 tablet 0  . temozolomide (TEMODAR) 100 MG capsule Take 3 capsules (300 mg total) by mouth daily. May take on an empty stomach to decrease nausea & vomiting. 15 capsule 0  . [DISCONTINUED] escitalopram (LEXAPRO) 5 MG tablet TAKE 1 TABLET BY MOUTH EVERYDAY AT BEDTIME 30 tablet 0   No current facility-administered medications on file prior to visit.    Allergies: No Known Allergies Past Medical History: No past medical history on file. Past Surgical History:  Past Surgical History:  Procedure Laterality Date  . CRANIOTOMY Left 10/13/2018   Procedure: LEFT CRANIOTOMY FOR TUMOR RESECTION;  Surgeon: OJudith Part MD;  Location: [facility]  Service: Neurosurgery;  Laterality: Left;   Social History:  Social History   Socioeconomic History  . Marital status: Single    Spouse name: Not on file  . Number of children: Not on file  . Years of education: Not on file  . Highest education level: Not on file  Occupational History  . Not on file  Tobacco Use  . Smoking status: Never Smoker  . Smokeless tobacco: Never Used  Substance and Sexual Activity  . Alcohol use: Not on file  . Drug use: Not  on file  . Sexual activity: Not on file  Other Topics Concern  . Not on file  Social History Narrative  . Not on file   Social Determinants of Health   Financial Resource Strain:   . Difficulty of Paying Living Expenses:   Food Insecurity:   . Worried About Charity fundraiser in the Last Year:   . Arboriculturist in the Last Year:     Transportation Needs:   . Film/video editor (Medical):   Marland Kitchen Lack of Transportation (Non-Medical):   Physical Activity:   . Days of Exercise per Week:   . Minutes of Exercise per Session:   Stress:   . Feeling of Stress :   Social Connections:   . Frequency of Communication with Friends and Family:   . Frequency of Social Gatherings with Friends and Family:   . Attends Religious Services:   . Active Member of Clubs or Organizations:   . Attends Archivist Meetings:   Marland Kitchen Marital Status:   Intimate Partner Violence:   . Fear of Current or Ex-Partner:   . Emotionally Abused:   Marland Kitchen Physically Abused:   . Sexually Abused:    Family History: No family history on file.  Review of Systems: Constitutional: Denies fevers, chills or abnormal weight loss Eyes: Denies blurriness of vision Ears, nose, mouth, throat, and face: Denies mucositis or sore throat Respiratory: Denies cough, dyspnea or wheezes Cardiovascular: Denies palpitation, chest discomfort or lower extremity swelling Gastrointestinal:  Denies nausea, constipation, diarrhea GU: Denies dysuria or incontinence Skin: Denies abnormal skin rashes Neurological: Per HPI Musculoskeletal: Denies joint pain, back or neck discomfort. No decrease in ROM Behavioral/Psych: Denies anxiety, disturbance in thought content, and mood instability  Physical Exam: Vitals:   07/28/19 1050  BP: 138/81  Pulse: 86  Resp: 18  Temp: 98.1 F (36.7 C)  SpO2: 99%   KPS: 60. General: Alert, cooperative, pleasant, in no acute distress Head: Craniotomy scar noted, dry and intact. EENT: No conjunctival injection or scleral icterus. Oral mucosa moist Lungs: Resp effort normal Cardiac: Regular rate and rhythm Abdomen: Soft, non-distended abdomen Skin: No rashes cyanosis or petechiae. Extremities: No clubbing or edema  Neurologic Exam: Mental Status: Awake, alert, attentive to examiner. Oriented to self and environment. Language is  impaired with regards to fluency.  Comprehension impaired to multi-step complex commands.  Simple repetition is preserved. Cranial Nerves: Visual acuity is grossly normal. Dense R homonymous hemianopia. Extra-ocular movements intact. No ptosis. Face is symmetric, tongue midline. Motor: Tone and bulk are normal. Power is 3/5 in right arm and leg, 5/5 on left side. Reflexes are symmetric, no pathologic reflexes present. Sensory: Intact to light touch and temperature Gait: Dystaxic, hemiparetic  Labs: I have reviewed the data as listed    Component Value Date/Time   NA 141 06/30/2019 1121   K 3.9 06/30/2019 1121   CL 105 06/30/2019 1121   CO2 26 06/30/2019 1121   GLUCOSE 81 06/30/2019 1121   BUN 8 06/30/2019 1121   CREATININE 0.87 06/30/2019 1121   CALCIUM 9.8 06/30/2019 1121   PROT 8.1 06/30/2019 1121   ALBUMIN 3.8 06/30/2019 1121   AST 14 (L) 06/30/2019 1121   ALT 12 06/30/2019 1121   ALKPHOS 77 06/30/2019 1121   BILITOT 0.3 06/30/2019 1121   GFRNONAA >60 06/30/2019 1121   GFRAA >60 06/30/2019 1121   Lab Results  Component Value Date   WBC 4.5 06/30/2019   NEUTROABS 3.4 06/30/2019  HGB 12.8 (L) 06/30/2019   HCT 39.0 06/30/2019   MCV 92.4 06/30/2019   PLT 187 06/30/2019   Imaging:  [facility] Clinician Interpretation: I have personally reviewed the CNS images as listed.  My interpretation, in the context of the patient's clinical presentation, is treatment effect vs true progression  MR BRAIN W WO CONTRAST  Result Date: 07/24/2019 CLINICAL DATA:  Glioblastoma multiform a of temporal lobe. Brain/CNS neoplasm, assess treatment response. EXAM: MRI HEAD WITHOUT AND WITH CONTRAST TECHNIQUE: Multiplanar, multiecho pulse sequences of the brain and surrounding structures were obtained without and with intravenous contrast. Additional history obtained from previous records: Left temporal glioblastoma status post resection September 2020, postop radiation. CONTRAST:  27m MULTIHANCE  GADOBENATE DIMEGLUMINE 529 MG/ML IV SOLN COMPARISON:  Prior brain MRI examinations 05/08/2019. FINDINGS: Brain: Redemonstrated sequela of previous left sided craniotomy and left temporal lobe mass resection with heterogeneous and cystic encephalomalacia. Unchanged small focus of restricted diffusion within the left temporal lobe which may reflect susceptibility artifact from hemosiderin deposition. There has been an interval decrease of the solid and nodular enhancement within the posterior limb of internal capsule as compared to brain MRI 05/08/2019. A 4 mm nodular focus of enhancement persists at this site (series 16, image 73). New from prior examination, there is ependymal/subependymal enhancement along the posterior left lateral ventricle (series 16, image 69). Additionally, T2/FLAIR hyperintensity at this site appears subtly increased (for instance as seen on series 11, image 12). T2/FLAIR hyperintensity within the left cerebral hemisphere is otherwise unchanged. Redemonstrated findings suggestive of wallerian degeneration within the left brainstem. There is no evidence of acute infarct. No extra-axial fluid collection. No midline shift. Vascular: Expected proximal arterial flow voids. Skull and upper cervical spine: Sequela of prior left sided craniotomy. Sinuses/Orbits: Visualized orbits show no acute finding. Minimal ethmoid sinus mucosal thickening. These results will be called to the ordering clinician or representative by the Radiologist Assistant, and communication documented in the PACS or [employer] IMPRESSION: Ependymal/subependymal enhancement along the posterior left lateral ventricle is new from prior MRI 05/08/2019. Additionally, T2/FLAIR hyperintensity at this site has subtly increased. Primary differential considerations include tumor progression/spread versus new post radiation inflammatory changes. Short interval contrast-enhanced MRI follow-up is recommended. Solid and nodular  enhancement within the posterior limb of left internal capsule has decreased. A 4 mm nodular enhancing focus persists at this site. Otherwise stable post treatment appearance of the brain as compared to 05/08/2019. Electronically Signed   By: KKellie SimmeringDO   On: 07/24/2019 09:55    Assessment/Plan Glioblastoma multiforme of temporal lobe ([facility] [C71.2]   ABlenda Mountsis clinically stable today following cycle #5.  MRI demonstrates new focus of enhancement adjacent to the left lateral ventricle which represents either organic tumor progression or radio-inflammatory effect.  Of note, this region is close by to the This change is not symptomatic.   For radiographic changes, we recommended repeating MRI brain in 1 month for further characterization.  At that time we would consider transitioning to second line therapy or clinical trial if additional progression is noted.  We recommended continuing treatment with cycle #6 Temozolomide 1528mm2, on for five days and off for twenty three days in twenty eight day cycles. The patient will have a complete blood count performed on days 21 and 28 of each cycle, and a comprehensive metabolic panel performed on day 28 of each cycle. Labs may need to be performed more often. Zofran will prescribed for home use for nausea/vomiting.   Chemotherapy  should be held for the following:  ANC less than 1,000  Platelets less than 100,000  LFT or creatinine greater than 2x ULN  If clinical concerns/contraindications develop  He continues to progress with functional improvement via neuro-directed PT and OT.  We will follow up with him in 4 weeks prior to cycle #7 with short interval MRI brain for review.  All questions were answered. The patient knows to call the clinic with any problems, questions or concerns. No barriers to learning were detected.  I have spent a total of 40 minutes of face-to-face and non-face-to-face time, excluding clinical staff time,  preparing to see patient, ordering tests and/or medications, counseling the patient, and independently interpreting results and communicating results to the patient/family/caregiver    Ventura Sellers, MD Medical Director of Neuro-Oncology [facility] at [facility] 07/28/19 10:33 AM

## 2019-07-29 ENCOUNTER — Ambulatory Visit: Payer: 59

## 2019-07-29 ENCOUNTER — Ambulatory Visit: Payer: 59 | Admitting: Physical Therapy

## 2019-07-29 ENCOUNTER — Encounter: Payer: Self-pay | Admitting: Physical Therapy

## 2019-07-29 ENCOUNTER — Telehealth: Payer: Self-pay | Admitting: Internal Medicine

## 2019-07-29 DIAGNOSIS — R2681 Unsteadiness on feet: Secondary | ICD-10-CM

## 2019-07-29 DIAGNOSIS — R2689 Other abnormalities of gait and mobility: Secondary | ICD-10-CM

## 2019-07-29 DIAGNOSIS — R41841 Cognitive communication deficit: Secondary | ICD-10-CM

## 2019-07-29 DIAGNOSIS — M6281 Muscle weakness (generalized): Secondary | ICD-10-CM

## 2019-07-29 DIAGNOSIS — R471 Dysarthria and anarthria: Secondary | ICD-10-CM

## 2019-07-29 DIAGNOSIS — R29818 Other symptoms and signs involving the nervous system: Secondary | ICD-10-CM

## 2019-07-29 DIAGNOSIS — R4701 Aphasia: Secondary | ICD-10-CM

## 2019-07-29 DIAGNOSIS — R482 Apraxia: Secondary | ICD-10-CM

## 2019-07-29 DIAGNOSIS — G8191 Hemiplegia, unspecified affecting right dominant side: Secondary | ICD-10-CM

## 2019-07-29 NOTE — Therapy (Signed)
[facility] [address] [place], [place], [address] Phone: [phone]   Fax:  [phone]  Speech Language Pathology Treatment  Patient Details  Name: Gregory Moreno MRN: [id number] Date of Birth: 04-06-1995 Referring Provider (SLP): Alger Simons, MD   Encounter Date: 07/29/2019   End of Session - 07/29/19 1712    Visit Number 43    Number of Visits 47    Date for SLP Re-Evaluation 08/21/19    SLP Start Time 1406    SLP Stop Time  1446    SLP Time Calculation (min) 40 min           No past medical history on file.  Past Surgical History:  Procedure Laterality Date  . CRANIOTOMY Left 10/13/2018   Procedure: LEFT CRANIOTOMY FOR TUMOR RESECTION;  Surgeon: Judith Part, MD;  Location: [facility];  Service: Neurosurgery;  Laterality: Left;    There were no vitals filed for this visit.   Subjective Assessment - 07/29/19 1413    Patient is accompained by: Family member   Gregory Moreno - mother   Currently in Pain? No/denies                 ADULT SLP TREATMENT - 07/29/19 1413      General Information   Behavior/Cognition Alert;Cooperative;Pleasant mood;Requires cueing      Treatment Provided   Treatment provided Cognitive-Linquistic      Cognitive-Linquistic Treatment   Treatment focused on Aphasia;Apraxia;Cognition    Skilled Treatment SLP reiterated pt can slow text to speech function on his phone slower than 0.7x if he needs to for more complex information. Pt named people at his surprise party with rare cues and extra time. SLP had to rephrase question about pt understanding texts or sending texts x2 for comprehension. SLP wrote out 5-7 word text messages (general) and pt knew 6/7 and got 7/7 with extra time. Pt generated names of those people at his birthday party with occasional min-mod cues. VNEsT was competed to foster expressive language with verb "throw" - pt thought of one object and one  subject spontaneously; req'd usual mod-max cues for two more of each. He generated a sentence with one set with mod A, and answered questions to expand the sentence with mod-max A.       Assessment / Recommendations / Plan   Plan Continue with current plan of care      Progression Toward Goals   Progression toward goals Progressing toward goals              SLP Short Term Goals - 07/15/19 1501      SLP SHORT TERM GOAL #1   Title pt will complete aphasia assessment of auditory comprehension and verbal expression within 1-2 sessions    Status Achieved      SLP SHORT TERM GOAL #2   Title pt will demo understanding of simple commands (spoken) with 70% and occasional min cues over three sessions    Time 1    Period Weeks    Status On-going      SLP SHORT TERM GOAL #3   Title pt will answer pertinent questions with at least 3 words    Status Not Met      SLP SHORT TERM GOAL #4   Title pt will demo sustained/selective attention necessary to complete efficacious speech therapy in 6 sessions    Baseline 04-07-19, 04-27-19, 04-29-19, 06-08-19    Time 1    Period Weeks  or 8 sessions   Status On-going            SLP Long Term Goals - 07/29/19 1713      SLP LONG TERM GOAL #1   Title pt will demo understanding of simple-mod complex conversation 75% of the time and rare min cues over three sessions    Baseline 02-06-19, 06-01-19, 06-03-19    Period --   or 17 sessions, for all LTGs   Status Achieved      SLP LONG TERM GOAL #2   Title pt will answer 5 questions with [facility] verbal responses, in 3 sessions    Baseline 01-27-19, 04-29-19    Status Achieved      SLP LONG TERM GOAL #3   Title pt will sustain loud /a/ with average mid 70s dB over 4 sessions    Status Deferred   due to focus on language     SLP LONG TERM GOAL #4   Title pt will engage in simple conversation for 5 minutes with average loudness mid 60s dB over 3 sessions    Status Deferred   due to focus on language      SLP LONG TERM GOAL #5   Title pt will generate 2 subjects and 3 objects for a given transitive verb with occasional min-mod A    Time 4    Period Weeks   or 47 total visits (for LTGs 5 and on); renewed 06-10-19   Status On-going   and ongoing     SLP LONG TERM GOAL #6   Title pt will participate functionally simple-mod complex conversation for 8 minutes in 2 sessions (receptively and expressively)    Time 2    Period Weeks    Status On-going      SLP LONG TERM GOAL #7   Title pt will successfully access text-to-speech function on his phone in 3 sessions    Baseline 07/15/19, 07-27-19    Time 3    Period Weeks    Status On-going      SLP LONG TERM GOAL #8   Title pt will demo understanding of >/= 1 sentence text messages, given modifications and compensations, in 3 sessions    Baseline 07-29-19    Time 2    Period Weeks    Status On-going            Plan - 07/29/19 1713    Clinical Impression Statement Pt with mod Broca's aphasia, dysarthria/voice, and cognitive communication deficits.  Pt also exhibits s/sx verbal apraxia. See "skilled intervention" for more details on today's session. Pt would cont to benefit from skilled ST focusing on primarily receptive and expressive language but also at some point in rehab process focus on speech loudness and cognitive communication skills. Pt cont to require skilled ST focusing primarily on language skills.    Speech Therapy Frequency 2x / week    Duration --   13 weeks or 41 visits   Treatment/Interventions Environmental controls;Functional tasks;Compensatory techniques;Multimodal communcation approach;SLP instruction and feedback;Cueing hierarchy;Language facilitation;Cognitive reorganization;Internal/external aids;Patient/family education    Potential to Achieve Goals Good    Potential Considerations Co-morbidities;Ability to learn/carryover information;Severity of impairments;Other (comment)           Patient will benefit from skilled  therapeutic intervention in order to improve the following deficits and impairments:   Aphasia  Verbal apraxia  Cognitive communication deficit  Dysarthria and anarthria    Problem List Patient Active Problem List   Diagnosis Date Noted  .  Goals of care, counseling/discussion 12/16/2018  . Palliative care by specialist   . Glioblastoma multiforme of temporal lobe ([facility]) 11/11/2018  . Spastic hemiparesis ([facility])   . Cerebral edema ([facility])   . Intracranial tumor ([facility])   . Steroid-induced hyperglycemia   . Spastic hemiplegia affecting nondominant side ([facility])   . Hyponatremia   . Transaminitis   . Leucocytosis   . Right spastic hemiplegia ([facility]) 10/21/2018  . Dysphagia 10/21/2018  . 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 Brain mass   . ICH (intracerebral hemorrhage) ([facility]) 10/13/2018    [facility] ,[place], [place]  07/29/2019, 5:15 PM  [facility] [address] [place], [place], [address] Phone: [phone]   Fax:  [phone]   Name: Gregory Moreno MRN: [id number] Date of Birth: 27-Jun-1995

## 2019-07-29 NOTE — Telephone Encounter (Signed)
Scheduled per 7/13 los. Unable to reach pt. Left voicemail with appt time and date.

## 2019-07-30 NOTE — Therapy (Signed)
[facility] [address] [place], [place], [address] Phone: [phone]   Fax:  [phone]  Physical Therapy Treatment  Patient Details  Name: Gregory Moreno MRN: [id number] Date of Birth: 11-18-1995 Referring Provider (PT): Alger Simons, MD   Encounter Date: 07/29/2019   PT End of Session - 07/30/19 0825    Visit Number 50    Number of Visits 57    Date for PT Re-Evaluation 08/12/19    Authorization Type [employer] - 2021, no VL, $40 copay per day (regardless of how many visits)    PT Start Time 1448    PT Stop Time 1530    PT Time Calculation (min) 42 min    Equipment Utilized During Treatment Gait belt    Activity Tolerance Patient tolerated treatment well    Behavior During Therapy [facility] for tasks assessed/performed           History reviewed. No pertinent past medical history.  Past Surgical History:  Procedure Laterality Date  . CRANIOTOMY Left 10/13/2018   Procedure: LEFT CRANIOTOMY FOR TUMOR RESECTION;  Surgeon: Judith Part, MD;  Location: [facility];  Service: Neurosurgery;  Laterality: Left;    There were no vitals filed for this visit.   Subjective Assessment - 07/29/19 1452    Subjective Saw Dr. [name] yesterday, will be restarting chemo tomorrow. Blood work is good.    Patient is accompained by: Family member    How long can you walk comfortably? 100' with hemiwalker    Patient Stated Goals wants to improve his walking, get back to normal    Currently in Pain? No/denies                             [facility] Adult PT Treatment/Exercise - 07/30/19 0001      Ambulation/Gait   Ambulation/Gait Yes    Ambulation/Gait Assistance 4: Min guard    Ambulation/Gait Assistance Details clinic distances between activities with no AD.    Assistive device None    Gait Pattern Step-through pattern;Decreased hip/knee flexion - right;Decreased dorsiflexion - right;Right genu recurvatum;Poor foot  clearance - right    Ambulation Surface Level;Indoor      Neuro Re-ed    Neuro Re-ed Details  Performed sit <> stand and then turned to face mat table with min guard, to get down to floor in half/tall kneeling on red compliant mat, needing min A to help assist with RLE and initial verbal/demo cues for technique. With LUE support on mat table performed resisted tall kneeling walking (with PT tech providing blue theraband resistance at pt's ASIS), x3 reps forward (approx. 4 steps), and then x3 reps backwards tall kneeling walking with therapist helping to assist with RLE hip extension and cues/tactile assist for upright posture and glute activation. Facing mat table performed resisted tall kneeling side steps to R x3 reps and then back to L x3 reps for hip ABD strengthening, cues for technique and therapist assist for proper pelvis alignment when performing. Pt needing min A and cues for technique to come from tall kneel > half kneel > stand.                Balance Exercises - 07/30/19 0001      Balance Exercises: Standing   SLS Eyes open;Solid surface;Limitations    SLS Limitations progressing from single UE support and then with no UE support - x12 reps stepping LLE over 4" beam  for SLS on RLE and closed chain hip ABD strengthening, cues for "soft" landing with LLE for more controlled SLS, pt improving with incr reps     Other Standing Exercises At edge of mat table, with chair to pt's L intermittently for balance with LUE, 2 x 10 reps hip/knee flexion with RLE tapping to 4" step, pt demonstrating improved hip/knee flexion to place foot on step, but needing frequent min A to pick foot off the step to place back on floor esp when fatigue. Step taps to 4" step with LLE for SLS on RLE x10 reps on level ground progressing to no UE support, then progressing to adding 1" foam under RLE for compliant surface x10 reps - with pt needing min A and verbal/tactile cues for R quad and glute activation during  SLS for incr stability. With RLE on 1" foam x15 reps step forward and back with LLE with no UE support for weight shift, SLS  and push off with RLE.                PT Short Term Goals - 07/08/19 1414      PT SHORT TERM GOAL #1   Title Pt will undergo further assessment of gait speed with no AD - LTG to be written as appropriate. ALL STGS DUE 07/09/19    Baseline 16.13 seconds = 2.03 ft/sec with no AD    Time 4    Period Weeks    Status Achieved    Target Date 07/09/19      PT SHORT TERM GOAL #2   Title Pt will undergo further assessment of TUG with no AD in order to determine fall risk - LTG to be written as appropriate    Baseline 15.25 seconds with no AD    Time 4    Period Weeks    Status Achieved      PT SHORT TERM GOAL #3   Title Pt will improve gait speed with small base quad cane to at least 2.1 ft/sec in order to improve community mobility.    Baseline 1.87 ft/sec, 14.1 seconds with small base quad cane = 2.32 ft/sec, 16.13 seconds = 2.03 ft/sec with no AD    Time 4    Period Weeks    Status Achieved      PT SHORT TERM GOAL #4   Title Pt will perform a curb with supervision with no AD in order to improve community mobility and independence    Baseline supervision while descending, min guard while ascending    Time 4    Period Weeks    Status Partially Met      PT SHORT TERM GOAL #5   Title Pt will ambulate at least 100' outdoors on grass with min guard with no AD in order to improve functional mobility while ambulating outdoors with family.    Baseline min guard/min A over grass surfaces with no AD    Time 4    Period Weeks    Status Partially Met             PT Long Term Goals - 07/08/19 1511      PT LONG TERM GOAL #1   Title Pt will improve gait speed with no AD to at least 2.2 ft/sec in order to demo improved gait efficiency. ALL LTGS DUE 08/10/19    Baseline 16.13 seconds = 2.03 ft/sec with no AD    Time 8    Period Weeks  Status Revised       PT LONG TERM GOAL #2   Title Pt will ambulate at least 200' over outdoor paved/gravel/mulch surfaces with supervision in order to improve functional mobility with no AD while camping/going on walks with family.    Baseline supervision over indoor suraces, min guard/min A over outdoor surfaces with no AD    Time 8    Period Weeks    Status New      PT LONG TERM GOAL #3   Title Pt will perform a curb with MOD I with no AD in order to improve community mobility and independence.    Baseline min guard with no AD    Time 8    Period Weeks    Status Revised      PT LONG TERM GOAL #4   Title Pt will decr TUG time to 13.5 seconds or less with no AD and with small base quad cane in order to demo decr fall risk.    Baseline 13.75 seconds with small base quad cane, 15.25 with no AD    Time 8    Period Weeks    Status Revised      PT LONG TERM GOAL #5   Title Patient will perform at least 4 steps using single handrail and step to pattern with supervision in order to safely enter/exit the RV.    Baseline 4 steps with single handrail and step to pattern - needs min A at times, overwise min guard on steps to clear RLE due to lip on step, min guard/supervision while descending    Time 8    Period Weeks    Status Revised      PT LONG TERM GOAL #6   Title Pt will improve gait speed with small base quad cane to at least 2.5 ft/sec in order to demo improved community mobility.    Baseline 2.32 ft/sec on 07/08/19    Time 8    Period Weeks    Status New                 Plan - 07/30/19 0826    Clinical Impression Statement Today's skilled session continued to focus on RLE strengthening in tall kneeling and standing, and  SLS activities on RLE on level and compliant surfaces. Pt demonstrating improved R knee control with SLS activities with RLE and pt needing no UE support for balance with incr reps. Due to apraxia, pt still needing min A and verbal/demo cues for floor <> stand transfers. Will  continue to progress towards LTGs.    Personal Factors and Comorbidities --   pt ungergoing chemo/radiation   Examination-Activity Limitations Bed Mobility;Locomotion Level;Sit;Squat;Stairs;Stand;Transfers    Examination-Participation Restrictions Community Activity;School;Driving    Stability/Clinical Decision Making Evolving/Moderate complexity    Rehab Potential Good    PT Frequency 2x / week    PT Duration 8 weeks    PT Treatment/Interventions ADLs/Self Care Home Management;Therapeutic exercise;Therapeutic activities;Functional mobility training;Stair training;Gait training;DME Instruction;Balance training;Neuromuscular re-education;Patient/family education;Orthotic Fit/Training;Wheelchair mobility training;Energy conservation;Passive range of motion    PT Next Visit Plan any update on pool? SLS activites on RLE. continue gait outdoors over unlevel surfaces/obstacles/curb and stairs with no AD. Half kneeling/tall kneeling for core/hip strengthening and balance.    PT Home Exercise Plan [id number]    Consulted and Agree with Plan of Care Patient;Family member/caregiver    Family Member Consulted Mom           Patient will benefit from skilled therapeutic  intervention in order to improve the following deficits and impairments:  Abnormal gait, Decreased activity tolerance, Decreased balance, Decreased cognition, Decreased coordination, Decreased safety awareness, Decreased range of motion, Decreased mobility, Difficulty walking, Decreased strength, Decreased endurance, Impaired tone  Visit Diagnosis: Other symptoms and signs involving the nervous system  Muscle weakness (generalized)  Unsteadiness on feet  Other abnormalities of gait and mobility  Hemiplegia affecting right dominant side, unspecified etiology, unspecified hemiplegia type [facility])     Problem List Patient Active Problem List   Diagnosis Date Noted  . Goals of care, counseling/discussion 12/16/2018  . Palliative care  by specialist   . Glioblastoma multiforme of temporal lobe ([place]) 11/11/2018  . Spastic hemiparesis ([place])   . Cerebral edema ([facility])   . Intracranial tumor ([place])   . Steroid-induced hyperglycemia   . Spastic hemiplegia affecting nondominant side ([place])   . Hyponatremia   . Transaminitis   . Leucocytosis   . Right spastic hemiplegia ([place]) 10/21/2018  . Dysphagia 10/21/2018  . 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 Brain mass   . ICH (intracerebral hemorrhage) ([place]) 10/13/2018    Arliss Journey, PT, DPT  07/30/2019, 8:40 AM  [facility] [address] [place], [place], [address] Phone: [phone]   Fax:  [phone]  Name: Gregory Moreno MRN: [id number] Date of Birth: Mar 03, 1995

## 2019-07-31 ENCOUNTER — Encounter: Payer: Self-pay | Admitting: Physical Medicine and Rehabilitation

## 2019-07-31 ENCOUNTER — Encounter: Payer: 59 | Attending: Physical Medicine and Rehabilitation | Admitting: Physical Medicine and Rehabilitation

## 2019-07-31 ENCOUNTER — Other Ambulatory Visit: Payer: Self-pay

## 2019-07-31 ENCOUNTER — Other Ambulatory Visit: Payer: Self-pay | Admitting: Radiation Therapy

## 2019-07-31 VITALS — BP 129/79 | HR 75 | Temp 98.9°F | Ht 69.0 in | Wt 184.6 lb

## 2019-07-31 DIAGNOSIS — G8110 Spastic hemiplegia affecting unspecified side: Secondary | ICD-10-CM

## 2019-07-31 DIAGNOSIS — C712 Malignant neoplasm of temporal lobe: Secondary | ICD-10-CM | POA: Diagnosis not present

## 2019-07-31 DIAGNOSIS — R252 Cramp and spasm: Secondary | ICD-10-CM | POA: Diagnosis present

## 2019-07-31 MED ORDER — HYDROXYZINE HCL 10 MG PO TABS
10.0000 mg | ORAL_TABLET | Freq: Three times a day (TID) | ORAL | 0 refills | Status: DC | PRN
Start: 2019-07-31 — End: 2019-10-03

## 2019-07-31 NOTE — Progress Notes (Signed)
Botox Injection for spasticity using Ultrasound guidance  Indication: Severe spasticity which interferes with ADL,mobility and/or  hygiene and is unresponsive to medication management and other conservative care Informed consent was obtained after describing risks and benefits of the procedure with the patient. This includes bleeding, bruising, infection, excessive weakness, or medication side effects. A REMS form is on file and signed.  All injections were done after locating the muscle using ultrasound and after negative drawback for blood. The patient tolerated the procedure well. Post procedure instructions were given. A follow-up appointment was made.   Biceps Brachii: 200U: 50 in 4 sites Flexor Digitorum profundus: 50U in 1 site Flexor Pollicis Longus: 16X in 1 site Flexor Digitorum Superficialis: 50U in 1 site Flexor carpi ulnaris: 30U in 1 site Flexor carpi radialis: 30U in 1 site Adductor Pollicis: 09U in 1 site.

## 2019-08-04 ENCOUNTER — Other Ambulatory Visit: Payer: Self-pay

## 2019-08-04 ENCOUNTER — Ambulatory Visit: Payer: 59

## 2019-08-04 DIAGNOSIS — R482 Apraxia: Secondary | ICD-10-CM

## 2019-08-04 DIAGNOSIS — R29818 Other symptoms and signs involving the nervous system: Secondary | ICD-10-CM | POA: Diagnosis not present

## 2019-08-04 DIAGNOSIS — R4701 Aphasia: Secondary | ICD-10-CM

## 2019-08-04 DIAGNOSIS — R41841 Cognitive communication deficit: Secondary | ICD-10-CM

## 2019-08-04 DIAGNOSIS — R471 Dysarthria and anarthria: Secondary | ICD-10-CM

## 2019-08-04 NOTE — Therapy (Signed)
[facility] [address] [place], [place], [address] Phone: [phone]   Fax:  [phone]  Speech Language Pathology Treatment  Patient Details  Name: Gregory Moreno MRN: [id number] Date of Birth: 1995/11/20 Referring Provider (SLP): Alger Simons, MD   Encounter Date: 08/04/2019   End of Session - 08/04/19 1603    Visit Number 44    Number of Visits 47    Date for SLP Re-Evaluation 08/21/19    SLP Start Time 1404    SLP Stop Time  1445    SLP Time Calculation (min) 41 min    Activity Tolerance Patient tolerated treatment well           No past medical history on file.  Past Surgical History:  Procedure Laterality Date  . CRANIOTOMY Left 10/13/2018   Procedure: LEFT CRANIOTOMY FOR TUMOR RESECTION;  Surgeon: Judith Part, MD;  Location: [facility];  Service: Neurosurgery;  Laterality: Left;    There were no vitals filed for this visit.   Subjective Assessment - 08/04/19 1435    Subjective "Hi"    Patient is accompained by: Family member   mother Gregory Moreno   Currently in Pain? No/denies                 ADULT SLP TREATMENT - 08/04/19 1436      General Information   Behavior/Cognition Cooperative;Lethargic;Requires cueing   flat mood     Treatment Provided   Treatment provided Cognitive-Linquistic      Cognitive-Linquistic Treatment   Treatment focused on Aphasia;Apraxia    Skilled Treatment Pt with last dose oral chemo last night. VNeST completed with pt with max cues consistently (total assist occasionally necessary).       Assessment / Recommendations / Plan   Plan Continue with current plan of care      Progression Toward Goals   Progression toward goals Progressing toward goals              SLP Short Term Goals - 07/15/19 1501      SLP SHORT TERM GOAL #1   Title pt will complete aphasia assessment of auditory comprehension and verbal expression within 1-2 sessions     Status Achieved      SLP SHORT TERM GOAL #2   Title pt will demo understanding of simple commands (spoken) with 70% and occasional min cues over three sessions    Time 1    Period Weeks    Status On-going      SLP SHORT TERM GOAL #3   Title pt will answer pertinent questions with at least 3 words    Status Not Met      SLP SHORT TERM GOAL #4   Title pt will demo sustained/selective attention necessary to complete efficacious speech therapy in 6 sessions    Baseline 04-07-19, 04-27-19, 04-29-19, 06-08-19    Time 1    Period Weeks   or 8 sessions   Status On-going            SLP Long Term Goals - 08/04/19 1410      SLP LONG TERM GOAL #1   Title pt will demo understanding of simple-mod complex conversation 75% of the time and rare min cues over three sessions    Baseline 02-06-19, 06-01-19, 06-03-19    Period --   or 17 sessions, for all LTGs   Status Achieved      SLP LONG TERM GOAL #2   Title  pt will answer 5 questions with [facility] verbal responses, in 3 sessions    Baseline 01-27-19, 04-29-19    Status Achieved      SLP LONG TERM GOAL #3   Title pt will sustain loud /a/ with average mid 70s dB over 4 sessions    Status Deferred   due to focus on language     SLP LONG TERM GOAL #4   Title pt will engage in simple conversation for 5 minutes with average loudness mid 60s dB over 3 sessions    Status Deferred   due to focus on language     SLP LONG TERM GOAL #5   Title pt will generate 2 subjects and 3 objects for a given transitive verb with occasional min-mod A    Time 3    Period Weeks   or 47 total visits (for LTGs 5 and on); renewed 06-10-19   Status On-going   and ongoing     SLP LONG TERM GOAL #6   Title pt will participate functionally simple-mod complex conversation for 8 minutes in 2 sessions (receptively and expressively)    Time 1    Period Weeks    Status On-going      SLP LONG TERM GOAL #7   Title pt will successfully access text-to-speech function on his phone in  3 sessions    Baseline 07/15/19, 07-27-19    Status Achieved      SLP LONG TERM GOAL #8   Title pt will demo understanding of >/= 1 sentence text messages, given modifications and compensations, in 3 sessions    Baseline 07-29-19    Time 1    Period Weeks    Status On-going            Plan - 08/04/19 1603    Clinical Impression Statement Pt with mod Broca's aphasia, dysarthria/voice, and cognitive communication deficits.  Today pt more lethargic and had much more difficulty with semantic paraphasias and persevration than in previous sessions - pt had last dose of oral chemo last night. Pt also exhibits s/sx verbal apraxia. See "skilled intervention" for more details on today's session. Pt would cont to benefit from skilled ST focusing on primarily receptive and expressive language but also at some point in rehab process focus on speech loudness and cognitive communication skills. Pt cont to require skilled ST focusing primarily on language skills.    Speech Therapy Frequency 2x / week    Duration --   13 weeks or 41 visits   Treatment/Interventions Environmental controls;Functional tasks;Compensatory techniques;Multimodal communcation approach;SLP instruction and feedback;Cueing hierarchy;Language facilitation;Cognitive reorganization;Internal/external aids;Patient/family education    Potential to Achieve Goals Good    Potential Considerations Co-morbidities;Ability to learn/carryover information;Severity of impairments;Other (comment)           Patient will benefit from skilled therapeutic intervention in order to improve the following deficits and impairments:   Verbal apraxia  Aphasia  Cognitive communication deficit  Dysarthria and anarthria    Problem List Patient Active Problem List   Diagnosis Date Noted  . Goals of care, counseling/discussion 12/16/2018  . Palliative care by specialist   . 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 Spastic  hemiparesis ([facility])   . Cerebral edema (HCC)   . Intracranial tumor ([facility])   . Steroid-induced hyperglycemia   . Spastic hemiplegia affecting nondominant side ([facility])   . Hyponatremia   . Transaminitis   . Leucocytosis   . Right spastic hemiplegia ([place]) 10/21/2018  . Dysphagia  10/21/2018  . 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 Brain mass   . ICH (intracerebral hemorrhage) ([facility]) 10/13/2018    [facility] ,[place], [place]  08/04/2019, 4:05 PM  [facility] [address] [place], [place], [address] Phone: [phone]   Fax:  [phone]   Name: RIESE HELLARD MRN: [id number] Date of Birth: 11-08-1995

## 2019-08-06 ENCOUNTER — Other Ambulatory Visit: Payer: Self-pay

## 2019-08-06 ENCOUNTER — Ambulatory Visit: Payer: 59

## 2019-08-06 DIAGNOSIS — R471 Dysarthria and anarthria: Secondary | ICD-10-CM

## 2019-08-06 DIAGNOSIS — R482 Apraxia: Secondary | ICD-10-CM

## 2019-08-06 DIAGNOSIS — R41841 Cognitive communication deficit: Secondary | ICD-10-CM

## 2019-08-06 DIAGNOSIS — R4701 Aphasia: Secondary | ICD-10-CM

## 2019-08-06 DIAGNOSIS — R29818 Other symptoms and signs involving the nervous system: Secondary | ICD-10-CM | POA: Diagnosis not present

## 2019-08-06 NOTE — Therapy (Signed)
[facility] [address] [place], [place], [address] Phone: [phone]   Fax:  [phone]  Speech Language Pathology Treatment  Patient Details  Name: Gregory Moreno MRN: [id number] Date of Birth: 1995/04/29 Referring Provider (SLP): Alger Simons, MD   Encounter Date: 08/06/2019   End of Session - 08/06/19 1702    Visit Number 45    Number of Visits 17    Date for SLP Re-Evaluation 08/21/19    SLP Start Time 9678    SLP Stop Time  1445    SLP Time Calculation (min) 40 min    Activity Tolerance Patient tolerated treatment well           History reviewed. No pertinent past medical history.  Past Surgical History:  Procedure Laterality Date  . CRANIOTOMY Left 10/13/2018   Procedure: LEFT CRANIOTOMY FOR TUMOR RESECTION;  Surgeon: Judith Part, MD;  Location: [facility];  Service: Neurosurgery;  Laterality: Left;    There were no vitals filed for this visit.   Subjective Assessment - 08/06/19 1420    Subjective "I did the computer." (pt, describing  his work yesterday for his mom)    Patient is accompained by: Family member   mom   Currently in Pain? No/denies                 ADULT SLP TREATMENT - 08/06/19 1420      General Information   Behavior/Cognition Cooperative;Requires cueing;Distractible      Treatment Provided   Treatment provided Cognitive-Linquistic      Cognitive-Linquistic Treatment   Treatment focused on Aphasia;Apraxia    Skilled Treatment Pt appears more alert today than previous session. SLP engaged pt in conversation about recent events in his daily routine/schedule. Pt req'd mod A usually for anomia. SLP had pt complete semantic feature analysis Pt responded more frequenty loday than last session however cont to require mod cues at usual level for anomia.      Assessment / Recommendations / Plan   Plan Continue with current plan of care      Progression Toward Goals    Progression toward goals Not progressing toward goals (comment)   ? benefit of skilled therapy may be slowing             SLP Short Term Goals - 08/06/19 1708      SLP SHORT TERM GOAL #1   Title pt will complete aphasia assessment of auditory comprehension and verbal expression within 1-2 sessions    Status Achieved      SLP SHORT TERM GOAL #2   Title pt will demo understanding of simple commands (spoken) with 70% and occasional min cues over three sessions    Status Achieved      SLP SHORT TERM GOAL #3   Title pt will answer pertinent questions with at least 3 words    Status Not Met      SLP SHORT TERM GOAL #4   Title pt will demo sustained/selective attention necessary to complete efficacious speech therapy in 6 sessions    Baseline 04-07-19, 04-27-19, 04-29-19, 06-08-19    Time 1    Period Weeks   or 8 sessions   Status Partially Met            SLP Long Term Goals - 08/06/19 1708      SLP LONG TERM GOAL #1   Title pt will demo understanding of simple-mod complex conversation 75% of the time and rare  min cues over three sessions    Baseline 02-06-19, 06-01-19, 06-03-19    Period --   or 17 sessions, for all LTGs   Status Achieved      SLP LONG TERM GOAL #2   Title pt will answer 5 questions with [facility] verbal responses, in 3 sessions    Baseline 01-27-19, 04-29-19    Status Achieved      SLP LONG TERM GOAL #3   Title pt will sustain loud /a/ with average mid 70s dB over 4 sessions    Status Deferred   due to focus on language     SLP LONG TERM GOAL #4   Title pt will engage in simple conversation for 5 minutes with average loudness mid 60s dB over 3 sessions    Status Deferred   due to focus on language     SLP LONG TERM GOAL #5   Title pt will generate 2 subjects and 3 objects for a given transitive verb with occasional min-mod A    Time 3    Period Weeks   or 47 total visits (for LTGs 5 and on); renewed 06-10-19   Status On-going   and ongoing     SLP LONG TERM  GOAL #6   Title pt will participate functionally simple-mod complex conversation for 8 minutes in 2 sessions (receptively and expressively)    Time 1    Period Weeks    Status On-going      SLP LONG TERM GOAL #7   Title pt will successfully access text-to-speech function on his phone in 3 sessions    Baseline 07/15/19, 07-27-19    Status Achieved      SLP LONG TERM GOAL #8   Title pt will demo understanding of >/= 1 sentence text messages, given modifications and compensations, in 3 sessions    Baseline 07-29-19, 08-06-19    Time 1    Period Weeks    Status Partially Met            Plan - 08/06/19 1702    Clinical Impression Statement Pt with mod Broca's aphasia, dysarthria/voice, and cognitive communication deficits.  Today pt more alert and interactive than previous session. 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 See "skilled intervention" for more details on today's session. SLP ?s pt's benefit for cont'd skilled ST. Pt may still cont to benefit from skilled ST focusing on primarily receptive and expressive language. Possible change of focus to speech loudness however SLP believes as pt is more confident about what he verbalizes speech volume improves to near normal.    Speech Therapy Frequency 2x / week    Duration --   13 weeks or 41 visits   Treatment/Interventions Environmental controls;Functional tasks;Compensatory techniques;Multimodal communcation approach;SLP instruction and feedback;Cueing hierarchy;Language facilitation;Cognitive reorganization;Internal/external aids;Patient/family education    Potential to Achieve Goals Good    Potential Considerations Co-morbidities;Ability to learn/carryover information;Severity of impairments;Other (comment)           Patient will benefit from skilled therapeutic intervention in order to improve the following deficits and impairments:   Aphasia  Verbal apraxia  Cognitive communication deficit  Dysarthria and  anarthria    Problem List Patient Active Problem List   Diagnosis Date Noted  . Goals of care, counseling/discussion 12/16/2018  . Palliative care by specialist   . Glioblastoma multiforme of temporal lobe ([facility]) 11/11/2018  . Spastic hemiparesis ([place])   . Cerebral edema (HCC)   . Intracranial tumor ([facility])   .  Steroid-induced hyperglycemia   . Spastic hemiplegia affecting nondominant side ([place])   . Hyponatremia   . Transaminitis   . Leucocytosis   . Right spastic hemiplegia ([place]) 10/21/2018  . Dysphagia 10/21/2018  . 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 Brain mass   . ICH (intracerebral hemorrhage) ([place]) 10/13/2018    [facility] ,[place], [place]  08/06/2019, 5:10 PM  [facility] [address] [place], [place], [address] Phone: [phone]   Fax:  [phone]   Name: Gregory Moreno MRN: [id number] Date of Birth: [birth year]

## 2019-08-06 NOTE — Patient Instructions (Signed)
Complete more of the "six questions" task at home with common objects

## 2019-08-10 ENCOUNTER — Ambulatory Visit: Payer: 59 | Admitting: Physical Therapy

## 2019-08-10 ENCOUNTER — Ambulatory Visit: Payer: 59

## 2019-08-10 ENCOUNTER — Other Ambulatory Visit: Payer: Self-pay

## 2019-08-10 DIAGNOSIS — R4701 Aphasia: Secondary | ICD-10-CM

## 2019-08-10 DIAGNOSIS — R482 Apraxia: Secondary | ICD-10-CM

## 2019-08-10 DIAGNOSIS — R2689 Other abnormalities of gait and mobility: Secondary | ICD-10-CM

## 2019-08-10 DIAGNOSIS — R2681 Unsteadiness on feet: Secondary | ICD-10-CM

## 2019-08-10 DIAGNOSIS — R41841 Cognitive communication deficit: Secondary | ICD-10-CM

## 2019-08-10 DIAGNOSIS — R29818 Other symptoms and signs involving the nervous system: Secondary | ICD-10-CM

## 2019-08-10 DIAGNOSIS — M6281 Muscle weakness (generalized): Secondary | ICD-10-CM

## 2019-08-10 NOTE — Patient Instructions (Signed)
° °  Do 2 of the V-NeST tasks each day at home.

## 2019-08-10 NOTE — Therapy (Signed)
[facility] [address] [place], [place], [address] Phone: [phone]   Fax:  [phone]  Physical Therapy Treatment  Patient Details  Name: Gregory Moreno MRN: [id number] Date of Birth: 1995-06-01 Referring Provider (PT): Alger Simons, MD   Encounter Date: 08/10/2019   PT End of Session - 08/10/19 1611    Visit Number 51    Number of Visits 57    Date for PT Re-Evaluation 08/12/19    Authorization Type [employer] - 2021, no VL, $40 copay per day (regardless of how many visits)    PT Start Time 1316    PT Stop Time 1401    PT Time Calculation (min) 45 min    Equipment Utilized During Treatment Gait belt    Activity Tolerance Patient tolerated treatment well    Behavior During Therapy [facility] for tasks assessed/performed           No past medical history on file.  Past Surgical History:  Procedure Laterality Date  . CRANIOTOMY Left 10/13/2018   Procedure: LEFT CRANIOTOMY FOR TUMOR RESECTION;  Surgeon: Judith Part, MD;  Location: [facility];  Service: Neurosurgery;  Laterality: Left;    There were no vitals filed for this visit.   Subjective Assessment - 08/10/19 1319    Subjective Washed his jeep yesterday.    Patient is accompained by: Family member    How long can you walk comfortably? 100' with hemiwalker    Patient Stated Goals wants to improve his walking, get back to normal    Currently in Pain? No/denies                             [facility] Adult PT Treatment/Exercise - 08/10/19 0001      Ambulation/Gait   Ambulation/Gait Yes    Ambulation/Gait Assistance 4: Min guard;3: Mod assist;4: Min assist    Ambulation/Gait Assistance Details ambulating outdoors over unlevel grass, mulch, and gravel surfaces. pt needing one episode of min/mod A to recover balance outdoors on grass when pt had too narrow of a BOS, and pt needing min A when ambulating down and up grassy inclines, when ambulating  back indoors when pt was fatigued, pt needing min A to recover balance due to another episode of having too narrow of a BOS. otherwise, pt only needing min guard with no AD    Ambulation Distance (Feet) 300 Feet    Assistive device None    Gait Pattern Step-through pattern;Decreased hip/knee flexion - right;Decreased dorsiflexion - right;Right genu recurvatum;Poor foot clearance - right    Ambulation Surface Level;Indoor;Outdoor;Paved;Grass;Unlevel    Stairs Yes    Stairs Assistance 4: Min guard    Stairs Assistance Details (indicate cue type and reason) pt able to clear RLE while ascending steps without assist from therapist, min guard when descending, pt with improvement in translating weight forwards over RLE when descending     Stair Management Technique Alternating pattern;One rail Left;Forwards    Number of Stairs 8    Height of Stairs 6    Curb 5: Supervision    Curb Details (indicate cue type and reason) x2 reps with ambulating up to curb with no AD and then stepping down with no AD, pt able to perform without hesistation and maintain gait speed     Gait Comments Standing on mulch for a compliant surface: x10 reps lateral stepping out with LLE for incr weight bearing/weight shifting towards RLE and  closed chain hip ABD strengthening, x10 reps lateral step outs with RLE for incr foot clearance and hip flexion (cues to not scrape the mulch so pt lifts up RLE), x10 reps posterior stepping with LLE for incr weight shifting towards RLE       Therapeutic Activites    Therapeutic Activities Other Therapeutic Activities    Other Therapeutic Activities discussed with pt and pt's dad about going on hold with therapy after this week (for land therapy) until pt can get scheduled in the pool for aquatics for PT, both verbalized understanding               Balance Exercises - 08/10/19 0001      Balance Exercises: Standing   Other Standing Exercises Balance in corner on thick blue foam: with  eyes closed with wider BOS x30 seconds, then feet hip width distance x30 seconds and feet together 2 x 30 seconds, with feet together and eyes closed progressed to head turns 2 x 5 reps and head nods 2 x 5 reps (with pt needing intermittent UE support on chair for balance and min A)    Other Standing Exercises Comments standing with BUE support: x12 reps heel raises for RLE PF activation             PT Education - 08/10/19 1611    Education Details see TA    Person(s) Educated Patient;Parent(s)    Methods Explanation    Comprehension Verbalized understanding            PT Short Term Goals - 07/08/19 1414      PT SHORT TERM GOAL #1   Title Pt will undergo further assessment of gait speed with no AD - LTG to be written as appropriate. ALL STGS DUE 07/09/19    Baseline 16.13 seconds = 2.03 ft/sec with no AD    Time 4    Period Weeks    Status Achieved    Target Date 07/09/19      PT SHORT TERM GOAL #2   Title Pt will undergo further assessment of TUG with no AD in order to determine fall risk - LTG to be written as appropriate    Baseline 15.25 seconds with no AD    Time 4    Period Weeks    Status Achieved      PT SHORT TERM GOAL #3   Title Pt will improve gait speed with small base quad cane to at least 2.1 ft/sec in order to improve community mobility.    Baseline 1.87 ft/sec, 14.1 seconds with small base quad cane = 2.32 ft/sec, 16.13 seconds = 2.03 ft/sec with no AD    Time 4    Period Weeks    Status Achieved      PT SHORT TERM GOAL #4   Title Pt will perform a curb with supervision with no AD in order to improve community mobility and independence    Baseline supervision while descending, min guard while ascending    Time 4    Period Weeks    Status Partially Met      PT SHORT TERM GOAL #5   Title Pt will ambulate at least 100' outdoors on grass with min guard with no AD in order to improve functional mobility while ambulating outdoors with family.    Baseline  min guard/min A over grass surfaces with no AD    Time 4    Period Weeks    Status Partially Met  PT Long Term Goals - 08/10/19 1620      PT LONG TERM GOAL #1   Title Pt will improve gait speed with no AD to at least 2.2 ft/sec in order to demo improved gait efficiency. ALL LTGS DUE 08/10/19    Baseline 16.13 seconds = 2.03 ft/sec with no AD    Time 8    Period Weeks    Status Revised      PT LONG TERM GOAL #2   Title Pt will ambulate at least 200' over outdoor paved/gravel/mulch surfaces with supervision in order to improve functional mobility with no AD while camping/going on walks with family.    Baseline min guard and needing one episode of min/mod A for balance, pt meeting distance portion    Time 8    Period Weeks    Status Partially Met      PT LONG TERM GOAL #3   Title Pt will perform a curb with MOD I with no AD in order to improve community mobility and independence.    Baseline supervision with no AD    Time 8    Period Weeks    Status Partially Met      PT LONG TERM GOAL #4   Title Pt will decr TUG time to 13.5 seconds or less with no AD and with small base quad cane in order to demo decr fall risk.    Baseline 13.75 seconds with small base quad cane, 15.25 with no AD    Time 8    Period Weeks    Status Revised      PT LONG TERM GOAL #5   Title Patient will perform at least 4 steps using single handrail and step to pattern with supervision in order to safely enter/exit the RV.    Baseline performed 4 steps with step through pattern and single handrail with min guard    Time 8    Period Weeks    Status Partially Met      PT LONG TERM GOAL #6   Title Pt will improve gait speed with small base quad cane to at least 2.5 ft/sec in order to demo improved community mobility.    Baseline 2.32 ft/sec on 07/08/19    Time 8    Period Weeks    Status New                 Plan - 08/10/19 1621    Clinical Impression Statement Today's skilled  session continued to focus on balance strategies and gait training on compliant surfaces and stair training. Pt needing 2 episodes of min/mod A today for balance during gait due to pt having too narrow of a BOS (one episode indoors when fatigued and 2nd outdoors over grass surfaces). Pt demonstrating improvement with stair negotiation - did not need assist when clearing RLE when ascending and pt demonstrating improved forward translation of weight over RLE when descending. Discussed at the end of this week going on hold for PT until pt can get scheduled for aquatics - pt and pt's dad in agreement.    Personal Factors and Comorbidities --   pt ungergoing chemo/radiation   Examination-Activity Limitations Bed Mobility;Locomotion Level;Sit;Squat;Stairs;Stand;Transfers    Examination-Participation Restrictions Community Activity;School;Driving    Stability/Clinical Decision Making Evolving/Moderate complexity    Rehab Potential Good    PT Frequency 2x / week    PT Duration 8 weeks    PT Treatment/Interventions ADLs/Self Care Home Management;Therapeutic exercise;Therapeutic activities;Functional mobility training;Stair training;Gait training;DME  Instruction;Balance training;Neuromuscular re-education;Patient/family education;Orthotic Fit/Training;Wheelchair mobility training;Energy conservation;Passive range of motion    PT Next Visit Plan check remainder of LTGs. any update on pool? SLS activites on RLE. continue gait outdoors over unlevel surfaces/obstacles/curb and stairs with no AD. Half kneeling/tall kneeling for core/hip strengthening and balance.    PT Home Exercise Plan [email or web address]    Consulted and Agree with Plan of Care Patient;Family member/caregiver    Family Member Consulted Mom           Patient will benefit from skilled therapeutic intervention in order to improve the following deficits and impairments:  Abnormal gait, Decreased activity tolerance, Decreased balance, Decreased cognition,  Decreased coordination, Decreased safety awareness, Decreased range of motion, Decreased mobility, Difficulty walking, Decreased strength, Decreased endurance, Impaired tone  Visit Diagnosis: Other symptoms and signs involving the nervous system  Muscle weakness (generalized)  Unsteadiness on feet  Other abnormalities of gait and mobility     Problem List Patient Active Problem List   Diagnosis Date Noted  . Goals of care, counseling/discussion 12/16/2018  . Palliative care by specialist   . Glioblastoma multiforme of temporal lobe ([place]) 11/11/2018  . Spastic hemiparesis ([place])   . Cerebral edema (HCC)   . Intracranial tumor ([place])   . Steroid-induced hyperglycemia   . Spastic hemiplegia affecting nondominant side ([place])   . Hyponatremia   . Transaminitis   . Leucocytosis   . Right spastic hemiplegia ([place]) 10/21/2018  . Dysphagia 10/21/2018  . 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 Brain mass   . ICH (intracerebral hemorrhage) ([place]) 10/13/2018    Arliss Journey, PT, DPT  08/10/2019, 4:23 PM  [facility] [address] [place], [place], [address] Phone: [phone]   Fax:  [phone]  Name: Gregory Moreno MRN: [id number] Date of Birth: [birth year]

## 2019-08-10 NOTE — Therapy (Signed)
[facility] [address] [place], [place], [address] Phone: [phone]   Fax:  [phone]  Speech Language Pathology Treatment  Patient Details  Name: Gregory Moreno MRN: [id number] Date of Birth: September 18, 1995 Referring Provider (SLP): Alger Simons, MD   Encounter Date: 08/10/2019   End of Session - 08/10/19 1719    Visit Number 46    Number of Visits 47    Date for SLP Re-Evaluation 08/21/19    SLP Start Time 5701    SLP Stop Time  7793    SLP Time Calculation (min) 43 min    Activity Tolerance Patient tolerated treatment well           History reviewed. No pertinent past medical history.  Past Surgical History:  Procedure Laterality Date  . CRANIOTOMY Left 10/13/2018   Procedure: LEFT CRANIOTOMY FOR TUMOR RESECTION;  Surgeon: Judith Part, MD;  Location: [facility];  Service: Neurosurgery;  Laterality: Left;    There were no vitals filed for this visit.   Subjective Assessment - 08/10/19 1404    Patient is accompained by: Family member   dad   Currently in Pain? No/denies                 ADULT SLP TREATMENT - 08/10/19 1404      General Information   Behavior/Cognition Alert;Cooperative;Distractible      Treatment Provided   Treatment provided Cognitive-Linquistic      Cognitive-Linquistic Treatment   Treatment focused on Aphasia;Apraxia    Skilled Treatment IUsing V-NesT today, pt req'd max cues consistently lfor subjects, and consistent mod-max cues for objects. Suspect pt is not performing these at home so SLP told pt's father that pt should complete at least 2 of these per day at home, using the verbs provided to pt's mother.       Assessment / Recommendations / Plan   Plan Continue with current plan of care      Progression Toward Goals   Progression toward goals Not progressing toward goals (comment)   progress slowing?           SLP Education - 08/10/19 1719    Education  Details encourage V-NEsT tasks at home    Person(s) Educated Patient;Parent(s)    Methods Explanation;Demonstration    Comprehension Verbalized understanding;Need further instruction            SLP Short Term Goals - 08/06/19 1708      SLP SHORT TERM GOAL #1   Title pt will complete aphasia assessment of auditory comprehension and verbal expression within 1-2 sessions    Status Achieved      SLP SHORT TERM GOAL #2   Title pt will demo understanding of simple commands (spoken) with 70% and occasional min cues over three sessions    Status Achieved      SLP Assumption #3   Title pt will answer pertinent questions with at least 3 words    Status Not Met      SLP SHORT TERM GOAL #4   Title pt will demo sustained/selective attention necessary to complete efficacious speech therapy in 6 sessions    Baseline 04-07-19, 04-27-19, 04-29-19, 06-08-19    Time 1    Period Weeks   or 8 sessions   Status Partially Met            SLP Long Term Goals - 08/10/19 1722      SLP LONG TERM GOAL #1  Title pt will demo understanding of simple-mod complex conversation 75% of the time and rare min cues over three sessions    Baseline 02-06-19, 06-01-19, 06-03-19    Period --   or 17 sessions, for all LTGs   Status Achieved      SLP LONG TERM GOAL #2   Title pt will answer 5 questions with [facility] verbal responses, in 3 sessions    Baseline 01-27-19, 04-29-19    Status Achieved      SLP LONG TERM GOAL #3   Title pt will sustain loud /a/ with average mid 70s dB over 4 sessions    Status Deferred   due to focus on language     SLP LONG TERM GOAL #4   Title pt will engage in simple conversation for 5 minutes with average loudness mid 60s dB over 3 sessions    Status Deferred   due to focus on language     SLP LONG TERM GOAL #5   Title pt will generate 2 subjects and 3 objects for a given transitive verb with occasional min-mod A    Time 3    Period Weeks   or 47 total visits (for LTGs 5 and on);  renewed 06-10-19   Status On-going   and ongoing     SLP LONG TERM GOAL #6   Title pt will participate functionally simple-mod complex conversation for 8 minutes in 2 sessions (receptively and expressively)    Time 1    Period Weeks    Status On-going      SLP LONG TERM GOAL #7   Title pt will successfully access text-to-speech function on his phone in 3 sessions    Baseline 07/15/19, 07-27-19    Status Achieved      SLP LONG TERM GOAL #8   Title pt will demo understanding of >/= 1 sentence text messages, given modifications and compensations, in 3 sessions    Baseline 07-29-19, 08-06-19    Time 1    Period Weeks    Status Achieved            Plan - 08/10/19 1719    Clinical Impression Statement Pt with mod Broca's aphasia, dysarthria/voice, and cognitive communication deficits. 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 See "skilled intervention" for more details on today's session. SLP beginning to ? pt's benefit for cont'd skilled ST. May renew for 4 weeks only, instead of 8. ST will cont to focusing on primarily receptive and expressive language. SLP contineus to believe as pt is more confident about what he verbalizes, speech volume improves/normalizes.    Speech Therapy Frequency 2x / week    Duration --   13 weeks or 41 visits   Treatment/Interventions Environmental controls;Functional tasks;Compensatory techniques;Multimodal communcation approach;SLP instruction and feedback;Cueing hierarchy;Language facilitation;Cognitive reorganization;Internal/external aids;Patient/family education    Potential to Achieve Goals Good    Potential Considerations Co-morbidities;Ability to learn/carryover information;Severity of impairments;Other (comment)           Patient will benefit from skilled therapeutic intervention in order to improve the following deficits and impairments:   Aphasia  Cognitive communication deficit  Verbal apraxia    Problem List Patient Active Problem  List   Diagnosis Date Noted  . Goals of care, counseling/discussion 12/16/2018  . Palliative care by specialist   . 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 Spastic hemiparesis ([facility])   . Cerebral edema (HCC)   . Intracranial tumor ([facility])   . Steroid-induced hyperglycemia   .  Spastic hemiplegia affecting nondominant side ([facility])   . Hyponatremia   . Transaminitis   . Leucocytosis   . Right spastic hemiplegia ([facility]) 10/21/2018  . Dysphagia 10/21/2018  . 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 Brain mass   . ICH (intracerebral hemorrhage) ([place]) 10/13/2018    [facility] ,[place], [place]  08/10/2019, 5:23 PM  [facility] [address] [place], [place], [address] Phone: [phone]   Fax:  [phone]   Name: DEMONIE KASSA MRN: [id number] Date of Birth: May 12, 1995

## 2019-08-12 ENCOUNTER — Other Ambulatory Visit: Payer: Self-pay

## 2019-08-12 ENCOUNTER — Ambulatory Visit: Payer: 59 | Admitting: Physical Therapy

## 2019-08-12 ENCOUNTER — Ambulatory Visit: Payer: 59

## 2019-08-12 DIAGNOSIS — R482 Apraxia: Secondary | ICD-10-CM

## 2019-08-12 DIAGNOSIS — R2689 Other abnormalities of gait and mobility: Secondary | ICD-10-CM

## 2019-08-12 DIAGNOSIS — R2681 Unsteadiness on feet: Secondary | ICD-10-CM

## 2019-08-12 DIAGNOSIS — R29818 Other symptoms and signs involving the nervous system: Secondary | ICD-10-CM | POA: Diagnosis not present

## 2019-08-12 DIAGNOSIS — R41841 Cognitive communication deficit: Secondary | ICD-10-CM

## 2019-08-12 DIAGNOSIS — R4701 Aphasia: Secondary | ICD-10-CM

## 2019-08-12 DIAGNOSIS — M6281 Muscle weakness (generalized): Secondary | ICD-10-CM

## 2019-08-12 NOTE — Therapy (Signed)
[facility] [address] [place], [place], [address] Phone: [phone]   Fax:  [phone]  Physical Therapy Treatment  Patient Details  Name: Gregory Moreno MRN: [id number] Date of Birth: Oct 12, 1995 Referring Provider (PT): Alger Simons, MD   Encounter Date: 08/12/2019   PT End of Session - 08/12/19 1611    Visit Number 52    Number of Visits 57    Date for PT Re-Evaluation 08/12/19    Authorization Type [employer] - 2021, no VL, $40 copay per day (regardless of how many visits)    PT Start Time 1404    PT Stop Time 1445    PT Time Calculation (min) 41 min    Equipment Utilized During Treatment Gait belt    Activity Tolerance Patient tolerated treatment well    Behavior During Therapy [facility] for tasks assessed/performed           No past medical history on file.  Past Surgical History:  Procedure Laterality Date  . CRANIOTOMY Left 10/13/2018   Procedure: LEFT CRANIOTOMY FOR TUMOR RESECTION;  Surgeon: Judith Part, MD;  Location: [facility];  Service: Neurosurgery;  Laterality: Left;    There were no vitals filed for this visit.   Subjective Assessment - 08/12/19 1410    Subjective Going on vacation in a couple of weeks.    Patient is accompained by: Family member    How long can you walk comfortably? 100' with hemiwalker    Patient Stated Goals wants to improve his walking, get back to normal    Currently in Pain? No/denies              [facility] PT Assessment - 08/12/19 1418      Timed Up and Go Test   Normal TUG (seconds) 12.5   with small base quad cane   TUG Comments 12.7 with no AD                         [facility] Adult PT Treatment/Exercise - 08/12/19 1418      Ambulation/Gait   Ambulation/Gait Yes    Ambulation/Gait Assistance 4: Min guard    Ambulation/Gait Assistance Details throughout session with no AD    Assistive device None    Gait Pattern Step-through pattern;Decreased  hip/knee flexion - right;Decreased dorsiflexion - right;Right genu recurvatum;Poor foot clearance - right    Ambulation Surface Level;Indoor    Gait velocity 14.77 seconds = 2.22 ft/sec with small base quad cane, 13.97 seconds with no AD = 2.34 FT/SEC     Stairs Yes    Stairs Assistance 4: Min guard    Stairs Assistance Details (indicate cue type and reason) cues for initial sequencing and for bending RLE when descending in order to properly translate weight anteriorly, pt improved with descending with incr reps    Stair Management Technique Alternating pattern;One rail Left;Forwards    Number of Stairs 12    Height of Stairs 6      Therapeutic Activites    Therapeutic Activities Other Therapeutic Activities    Other Therapeutic Activities discussed going on hold with PT until pt can be seen for PT for aquatics (current aquatic PT is out due to injury and pt will be on vacation for a couple weeks)      Neuro Re-ed    Neuro Re-ed Details  standing at bottom of stair case with no UE support: x10 reps R hip and knee flexion  onto first 6" step with occassional min A for foot clearance. in // bars: standing on red beam: alternating forward stepping with LLE (progressing to taking incr step lengths) and then bringing foot back onto beam, x15 reps with intermittent UE support on bars      Knee/Hip Exercises: Standing   Forward Step Up Right;10 reps;Hand Hold: 0;Step Height: 6";Step Height: 8"    Forward Step Up Limitations x10 reps onto 6" step, x10 reps onto 8" step, tapping LLE up to step (cues to perform with control)                  PT Education - 08/12/19 1609    Education Details progress and results of LTGs. discussed going on hold with PT until pt can be seen for aquatics (aquatic PT currently out at this time and pt will be going on vacation for the next month), therapist to call pt when more info is known about aquatics.    Person(s) Educated Patient;Parent(s)    Methods  Explanation    Comprehension Verbalized understanding            PT Short Term Goals - 07/08/19 1414      PT SHORT TERM GOAL #1   Title Pt will undergo further assessment of gait speed with no AD - LTG to be written as appropriate. ALL STGS DUE 07/09/19    Baseline 16.13 seconds = 2.03 ft/sec with no AD    Time 4    Period Weeks    Status Achieved    Target Date 07/09/19      PT SHORT TERM GOAL #2   Title Pt will undergo further assessment of TUG with no AD in order to determine fall risk - LTG to be written as appropriate    Baseline 15.25 seconds with no AD    Time 4    Period Weeks    Status Achieved      PT SHORT TERM GOAL #3   Title Pt will improve gait speed with small base quad cane to at least 2.1 ft/sec in order to improve community mobility.    Baseline 1.87 ft/sec, 14.1 seconds with small base quad cane = 2.32 ft/sec, 16.13 seconds = 2.03 ft/sec with no AD    Time 4    Period Weeks    Status Achieved      PT SHORT TERM GOAL #4   Title Pt will perform a curb with supervision with no AD in order to improve community mobility and independence    Baseline supervision while descending, min guard while ascending    Time 4    Period Weeks    Status Partially Met      PT SHORT TERM GOAL #5   Title Pt will ambulate at least 100' outdoors on grass with min guard with no AD in order to improve functional mobility while ambulating outdoors with family.    Baseline min guard/min A over grass surfaces with no AD    Time 4    Period Weeks    Status Partially Met             PT Long Term Goals - 08/12/19 1416      PT LONG TERM GOAL #1   Title Pt will improve gait speed with no AD to at least 2.2 ft/sec in order to demo improved gait efficiency. ALL LTGS DUE 08/10/19    Baseline 16.13 seconds = 2.03 ft/sec with no AD, 13.97 seconds =  2.34 ft.sec    Time 8    Period Weeks    Status Achieved      PT LONG TERM GOAL #2   Title Pt will ambulate at least 200' over  outdoor paved/gravel/mulch surfaces with supervision in order to improve functional mobility with no AD while camping/going on walks with family.    Baseline min guard and needing one episode of min/mod A for balance, pt meeting distance portion    Time 8    Period Weeks    Status Partially Met      PT LONG TERM GOAL #3   Title Pt will perform a curb with MOD I with no AD in order to improve community mobility and independence.    Baseline supervision with no AD    Time 8    Period Weeks    Status Partially Met      PT LONG TERM GOAL #4   Title Pt will decr TUG time to 13.5 seconds or less with no AD and with small base quad cane in order to demo decr fall risk.    Baseline 12.5 seconds with small base quad cane, 12.7 seconds with no AD    Time 8    Period Weeks    Status Achieved      PT LONG TERM GOAL #5   Title Patient will perform at least 4 steps using single handrail and step to pattern with supervision in order to safely enter/exit the RV.    Baseline performed 4 steps with step through pattern and single handrail with min guard    Time 8    Period Weeks    Status Partially Met      PT LONG TERM GOAL #6   Title Pt will improve gait speed with small base quad cane to at least 2.5 ft/sec in order to demo improved community mobility.    Baseline 2.32 ft/sec on 07/08/19, 2.22 ft/sec on 08/12/19    Time 8    Period Weeks    Status Not Met                 Plan - 08/12/19 1626    Clinical Impression Statement Focus of today's skilled session was assessing remainder of pt's LTGs. Pt achieved LTG #1 and LTG #4. Pt improved gait speed with no AD to 2.34 ft/sec (previously 2.03 ft/sec). Pt improved TUG time with no AD to 12.7 seconds and with small base quad cane to 12.5 seconds, indicating pt is at a decreased risk for falls. Pt with minimal change in gait speed with use of small base quad cane - 2.22 ft/sec today and last assessed at 2.32 ft/sec. However pt demonstrating  improved gait mechanics with improved heel strike and foot clearance. Discussed with pt and pt's mom about going on hold with PT until pt can be seen for aquatic therapy (current aquatic PT is out due to injury and pt will be on vacation for a month) and then have one more land session as a follow-up. Both in agreement.    Personal Factors and Comorbidities --   pt ungergoing chemo/radiation   Examination-Activity Limitations Bed Mobility;Locomotion Level;Sit;Squat;Stairs;Stand;Transfers    Examination-Participation Restrictions Community Activity;School;Driving    Stability/Clinical Decision Making Evolving/Moderate complexity    Rehab Potential Good    PT Frequency 2x / week    PT Duration 8 weeks    PT Treatment/Interventions ADLs/Self Care Home Management;Therapeutic exercise;Therapeutic activities;Functional mobility training;Stair training;Gait training;DME Instruction;Balance training;Neuromuscular re-education;Patient/family education;Orthotic Fit/Training;Wheelchair  mobility training;Energy conservation;Passive range of motion    PT Next Visit Plan waiting for pool. SLS activites on RLE. continue gait outdoors over unlevel surfaces/obstacles/curb and stairs with no AD. Half kneeling/tall kneeling for core/hip strengthening and balance.    PT Home Exercise Plan [id number]    Consulted and Agree with Plan of Care Patient;Family member/caregiver    Family Member Consulted Mom           Patient will benefit from skilled therapeutic intervention in order to improve the following deficits and impairments:  Abnormal gait, Decreased activity tolerance, Decreased balance, Decreased cognition, Decreased coordination, Decreased safety awareness, Decreased range of motion, Decreased mobility, Difficulty walking, Decreased strength, Decreased endurance, Impaired tone  Visit Diagnosis: Other symptoms and signs involving the nervous system  Muscle weakness (generalized)  Unsteadiness on  feet  Other abnormalities of gait and mobility     Problem List Patient Active Problem List   Diagnosis Date Noted  . Goals of care, counseling/discussion 12/16/2018  . Palliative care by specialist   . Glioblastoma multiforme of temporal lobe ([place]) 11/11/2018  . Spastic hemiparesis ([place])   . Cerebral edema (HCC)   . Intracranial tumor ([place])   . Steroid-induced hyperglycemia   . Spastic hemiplegia affecting nondominant side ([place])   . Hyponatremia   . Transaminitis   . Leucocytosis   . Right spastic hemiplegia ([place]) 10/21/2018  . Dysphagia 10/21/2018  . 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 Brain mass   . ICH (intracerebral hemorrhage) ([place]) 10/13/2018    Arliss Journey, PT, DPT  08/12/2019, 4:26 PM  [facility] [address] [place], [place], [address] Phone: [phone]   Fax:  [phone]  Name: Gregory Moreno MRN: [id number] Date of Birth: 12/04/1995

## 2019-08-12 NOTE — Therapy (Signed)
[facility] [address] [place], [place], [address] Phone: [phone]   Fax:  [phone]  Speech Language Pathology Treatment  Patient Details  Name: Gregory Moreno MRN: [id number] Date of Birth: [birth year] Referring Provider (SLP): Alger Simons, MD   Encounter Date: 08/12/2019   End of Session - 08/12/19 1651    Visit Number 47    Number of Visits 47    Date for SLP Re-Evaluation 08/21/19    SLP Start Time 3094    SLP Stop Time  1530    SLP Time Calculation (min) 41 min    Activity Tolerance Patient tolerated treatment well           No past medical history on file.  Past Surgical History:  Procedure Laterality Date  . CRANIOTOMY Left 10/13/2018   Procedure: LEFT CRANIOTOMY FOR TUMOR RESECTION;  Surgeon: Judith Part, MD;  Location: [facility];  Service: Neurosurgery;  Laterality: Left;    There were no vitals filed for this visit.   Subjective Assessment - 08/12/19 1459    Subjective "Speech therapy" (pt re: What therapy did you just have?)    Patient is accompained by: Family member   mother   Currently in Pain? No/denies                 ADULT SLP TREATMENT - 08/12/19 1645      General Information   Behavior/Cognition Alert;Cooperative;Distractible      Treatment Provided   Treatment provided Cognitive-Linquistic      Cognitive-Linquistic Treatment   Treatment focused on Aphasia;Apraxia    Skilled Treatment SLP used V-NesT today and also cont to educate pt's parents how to complete at home by engaging them frequently during the session, explaining procedure. Pt req'd max cues consistently for generating subjects and objects, and also req'd encouragement to use anomia compensations instead of staying "stuck". SLP told mother that semantic feature analysis could be used with nouns that are difficult but salient for pt on the V-NEsT. Pt is out of current visits. He is using tesxt to  speech independently and using speech to text with some min=mod cues occasionally.       Assessment / Recommendations / Plan   Plan Hold/pause therapy at this time until pt returns from vacation in mid-September     Progression Toward Goals   Progression toward goals Not progressing toward goals (comment)   severity,            SLP Education - 08/12/19 1650    Education Details best therapy tasks (e.g., V-NesT) arise out of LIFE at this point, scheduling 8 more visits for renewal    Person(s) Educated Patient;Parent(s)    Methods Explanation    Comprehension Verbalized understanding            SLP Short Term Goals - 08/06/19 1708      SLP SHORT TERM GOAL #1   Title pt will complete aphasia assessment of auditory comprehension and verbal expression within 1-2 sessions    Status Achieved      SLP SHORT TERM GOAL #2   Title pt will demo understanding of simple commands (spoken) with 70% and occasional min cues over three sessions    Status Achieved      SLP SHORT TERM GOAL #3   Title pt will answer pertinent questions with at least 3 words    Status Not Met      SLP SHORT TERM GOAL #4  Title pt will demo sustained/selective attention necessary to complete efficacious speech therapy in 6 sessions    Baseline 04-07-19, 04-27-19, 04-29-19, 06-08-19    Time 1    Period Weeks   or 8 sessions   Status Partially Met            SLP Long Term Goals - 08/12/19 1653      SLP LONG TERM GOAL #1   Title pt will demo understanding of simple-mod complex conversation 75% of the time and rare min cues over three sessions    Baseline 02-06-19, 06-01-19, 06-03-19    Period --   or 17 sessions, for all LTGs   Status Achieved      SLP LONG TERM GOAL #2   Title pt will answer 5 questions with [facility] verbal responses, in 3 sessions    Baseline 01-27-19, 04-29-19    Status Achieved      SLP LONG TERM GOAL #3   Title pt will sustain loud /a/ with average mid 70s dB over 4 sessions    Status  Deferred   due to focus on language     SLP LONG TERM GOAL #4   Title pt will engage in simple conversation for 5 minutes with average loudness mid 60s dB over 3 sessions    Status Deferred   due to focus on language     SLP LONG TERM GOAL #5   Title pt will generate 2 subjects and 3 objects for a given transitive verb with usual min-mod A    Time 3    Period Weeks   needs renewed in Sept.   Status Not Met   and ongoing     SLP LONG TERM GOAL #6   Title pt will participate functionally simple-mod complex conversation for 8 minutes in 2 sessions (receptively and expressively)    Time 1    Period Weeks   (needs renewed in Sept)   Status Not Met   and onoging     SLP LONG TERM GOAL #7   Title pt will successfully access text-to-speech function on his phone in 3 sessions    Baseline 07/15/19, 07-27-19    Status Achieved      SLP LONG TERM GOAL #8   Title pt will demo understanding of >/= 1 sentence text messages, given modifications and compensations, in 3 sessions    Baseline 07-29-19, 08-06-19    Time 1    Period Weeks    Status Achieved            Plan - 08/12/19 1651    Clinical Impression Statement Pt with mod Broca's aphasia, dysarthria/voice, and cognitive communication deficits. 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 See "skilled intervention" for more details on today's session. SLP beginning to ? pt's benefit for cont'd skilled ST.Pt will be on vacation from mid-August to mid-September; SLP told mother today that pt's best ST arises from life experience and talking about what pt has felt, seen, tasted, heard. SLP to renew for 4 weeks only, instead of 8, when pt returns from vacation in mid-September. ST will cont to focusing on primarily receptive and expressive language. SLP contineus to believe as pt is more confident about what he verbalizes, speech volume improves/normalizes.    Speech Therapy Frequency 2x / week    Duration --   13 weeks or 41 visits    Treatment/Interventions Environmental controls;Functional tasks;Compensatory techniques;Multimodal communcation approach;SLP instruction and feedback;Cueing hierarchy;Language facilitation;Cognitive reorganization;Internal/external aids;Patient/family education  Potential to Achieve Goals Good    Potential Considerations Co-morbidities;Ability to learn/carryover information;Severity of impairments;Other (comment)           Patient will benefit from skilled therapeutic intervention in order to improve the following deficits and impairments:   Aphasia  Cognitive communication deficit  Verbal apraxia    Problem List Patient Active Problem List   Diagnosis Date Noted  . Goals of care, counseling/discussion 12/16/2018  . Palliative care by specialist   . 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 Spastic hemiparesis ([facility])   . Cerebral edema ([facility])   . Intracranial tumor ([facility])   . Steroid-induced hyperglycemia   . Spastic hemiplegia affecting nondominant side ([facility])   . Hyponatremia   . Transaminitis   . Leucocytosis   . Right spastic hemiplegia ([facility]) 10/21/2018  . Dysphagia 10/21/2018  . 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 Brain mass   . ICH (intracerebral hemorrhage) ([facility]) 10/13/2018    [facility] ,[place], [place]  08/12/2019, 4:56 PM  [facility] [address] [place], [place], [address] Phone: [phone]   Fax:  [phone]   Name: CORDARRO SPINNATO MRN: [id number] Date of Birth: 1995-02-14

## 2019-08-21 ENCOUNTER — Ambulatory Visit
Admission: RE | Admit: 2019-08-21 | Discharge: 2019-08-21 | Disposition: A | Discharge status: Home / Self Care | Payer: 59 | Source: Ambulatory Visit | Attending: Internal Medicine | Admitting: Internal Medicine

## 2019-08-21 ENCOUNTER — Other Ambulatory Visit: Payer: Self-pay

## 2019-08-21 DIAGNOSIS — C712 Malignant neoplasm of temporal lobe: Secondary | ICD-10-CM

## 2019-08-21 MED ORDER — GADOBENATE DIMEGLUMINE 529 MG/ML IV SOLN
15.0000 mL | Freq: Once | INTRAVENOUS | Status: AC | PRN
  Administered 2019-08-21: 15 mL via INTRAVENOUS

## 2019-08-24 ENCOUNTER — Inpatient Hospital Stay: Payer: 59

## 2019-08-24 ENCOUNTER — Inpatient Hospital Stay: Payer: 59 | Attending: Internal Medicine | Admitting: Internal Medicine

## 2019-08-24 ENCOUNTER — Other Ambulatory Visit: Payer: Self-pay

## 2019-08-24 ENCOUNTER — Encounter: Payer: Self-pay | Admitting: Internal Medicine

## 2019-08-24 VITALS — BP 136/88 | HR 81 | Temp 99.3°F | Resp 18 | Ht 69.0 in | Wt 186.5 lb

## 2019-08-24 DIAGNOSIS — C712 Malignant neoplasm of temporal lobe: Secondary | ICD-10-CM

## 2019-08-24 DIAGNOSIS — Z79899 Other long term (current) drug therapy: Secondary | ICD-10-CM | POA: Diagnosis not present

## 2019-08-24 DIAGNOSIS — Z923 Personal history of irradiation: Secondary | ICD-10-CM | POA: Insufficient documentation

## 2019-08-24 LAB — CBC WITH DIFFERENTIAL (CANCER CENTER ONLY)
Abs Immature Granulocytes: 0.64 10*3/uL — ABNORMAL HIGH (ref 0.00–0.07)
Basophils Absolute: 0 10*3/uL (ref 0.0–0.1)
Basophils Relative: 0 %
Eosinophils Absolute: 0.1 10*3/uL (ref 0.0–0.5)
Eosinophils Relative: 1 %
HCT: 44.4 % (ref 39.0–52.0)
Hemoglobin: 14.5 g/dL (ref 13.0–17.0)
Immature Granulocytes: 6 %
Lymphocytes Relative: 11 %
Lymphs Abs: 1.1 10*3/uL (ref 0.7–4.0)
MCH: 29.3 pg (ref 26.0–34.0)
MCHC: 32.7 g/dL (ref 30.0–36.0)
MCV: 89.7 fL (ref 80.0–100.0)
Monocytes Absolute: 0.9 10*3/uL (ref 0.1–1.0)
Monocytes Relative: 9 %
Neutro Abs: 7.4 10*3/uL (ref 1.7–7.7)
Neutrophils Relative %: 73 %
Platelet Count: 114 10*3/uL — ABNORMAL LOW (ref 150–400)
RBC: 4.95 MIL/uL (ref 4.22–5.81)
RDW: 16.1 % — ABNORMAL HIGH (ref 11.5–15.5)
WBC Count: 10.1 10*3/uL (ref 4.0–10.5)
nRBC: 0 % (ref 0.0–0.2)

## 2019-08-24 LAB — CMP (CANCER CENTER ONLY)
ALT: 15 U/L (ref 0–44)
AST: 11 U/L — ABNORMAL LOW (ref 15–41)
Albumin: 3.5 g/dL (ref 3.5–5.0)
Alkaline Phosphatase: 98 U/L (ref 38–126)
Anion gap: 7 (ref 5–15)
BUN: 13 mg/dL (ref 6–20)
CO2: 26 mmol/L (ref 22–32)
Calcium: 9.6 mg/dL (ref 8.9–10.3)
Chloride: 103 mmol/L (ref 98–111)
Creatinine: 0.88 mg/dL (ref 0.61–1.24)
GFR, Est AFR Am: >60 mL/min (ref >60)
GFR, Estimated: >60 mL/min (ref >60)
Glucose, Bld: 87 mg/dL (ref 70–99)
Potassium: 4.2 mmol/L (ref 3.5–5.1)
Sodium: 136 mmol/L (ref 135–145)
Total Bilirubin: 0.4 mg/dL (ref 0.3–1.2)
Total Protein: 7.6 g/dL (ref 6.5–8.1)

## 2019-08-24 NOTE — Progress Notes (Signed)
[facility] [place], [place] [address] [phone]   Interval Evaluation  Date of Service: 08/24/19 Patient Name: Gregory Moreno Patient MRN: [id number] Patient DOB: 09/07/1995 Provider: Ventura Sellers, MD  Identifying Statement:  [name] is a 24 y.o. male with left temporal glioblastoma   Referring Provider: [facility] [address] [place],  [place] [address]  Oncologic History: Oncology History  Glioblastoma multiforme of temporal lobe ([facility])  10/13/2018 Surgery   Craniotomy, debulking resection and hematoma evacuation with Dr. Zada Finders.   12/02/2018 - 01/13/2019 Radiation Therapy   IMRT and concurrent Temodar 69mm2   02/09/2019 -  Chemotherapy   The patient had temozolomide (TEMODAR) 100 MG capsule, 150 mg/m2/day = 300 mg, Oral, Daily, 1 of 1 cycle, Start date: 06/30/2019, End date: -- Dose modification: 150 mg/m2/day (original dose 150 mg/m2/day, Cycle 1), 200 mg/m2/day (original dose 150 mg/m2/day, Cycle 1)  for chemotherapy treatment.      Biomarkers:  MGMT Methylated.  IDH 1/2 Wild type.  EGFR Not expressed  TERT Unmutated   Interval History:  AKNOWLEDGE ESCANDONpresents today for follow up following after completing cycle #6 adjuvant Temodar. No significant changes in right sided motor function.  Still able to ambulate short distances without use of a cane.  Remains with dense left visual field impairment.  Language continues to have expression limitations, but making some gains there as well.  No problems with Temodar this month, no vomiting.  Has been working 20 hours per week at his [employer]  Medications: Current Outpatient Medications on File Prior to Visit  Medication Sig Dispense Refill  . camphor-menthol (SARNA) lotion Apply 1 application topically as needed for itching. 222 mL 0  . escitalopram (LEXAPRO) 5 MG tablet TAKE 1 TABLET BY MOUTH  EVERYDAY AT BEDTIME 90 tablet 1  . hydrOXYzine (ATARAX/VISTARIL) 10 MG tablet Take 1 tablet (10 mg total) by mouth 3 (three) times daily as needed. 30 tablet 0  . ondansetron (ZOFRAN) 4 MG tablet Take 1 tablet (4 mg total) by mouth every 8 (eight) hours as needed for nausea or vomiting. 30 tablet 1  . ondansetron (ZOFRAN) 8 MG tablet TAKE 1 TABLET BY MOUTH TWICE DAILY AS NEEDED FOR NAUSEA AND VOMITING. MAY TAKE 30-60 MINUTES PRIOR TO TEMODAR ADMINISTRATION IF NAUSEA/VOMITING OCCURS. 30 tablet 0  . temozolomide (TEMODAR) 100 MG capsule TAKE 3 CAPSULES (300 MG TOTAL) BY MOUTH DAILY. MAY TAKE ON AN EMPTY STOMACH TO DECREASE NAUSEA & VOMITING. 15 capsule 0  . [DISCONTINUED] escitalopram (LEXAPRO) 5 MG tablet TAKE 1 TABLET BY MOUTH EVERYDAY AT BEDTIME 30 tablet 0   No current facility-administered medications on file prior to visit.    Allergies: No Known Allergies Past Medical History: History reviewed. No pertinent past medical history. Past Surgical History:  Past Surgical History:  Procedure Laterality Date  . CRANIOTOMY Left 10/13/2018   Procedure: LEFT CRANIOTOMY FOR TUMOR RESECTION;  Surgeon: OJudith Part MD;  [place] [facility]  Service: Neurosurgery;  Laterality: Left;   Social History:  Social History   Socioeconomic History  . Marital status: Single    Spouse name: Not on file  . Number of children: Not on file  . Years of education: Not on file  . Highest education level: Not on file  Occupational History  . Not on file  Tobacco Use  . Smoking status: Never Smoker  . Smokeless tobacco: Never Used  Substance and Sexual Activity  . Alcohol use: Not on file  . Drug use: Not on file  . Sexual activity: Not on file  Other Topics Concern  . Not on file  Social History Narrative  . Not on file   Social Determinants of Health   Financial Resource Strain:   . Difficulty of Paying Living Expenses:   Food Insecurity:   . Worried About Charity fundraiser in the Last  Year:   . Arboriculturist in the Last Year:   Transportation Needs:   . Film/video editor (Medical):   [place] Kitchen Lack of Transportation (Non-Medical):   Physical Activity:   . Days of Exercise per Week:   . Minutes of Exercise per Session:   Stress:   . Feeling of Stress :   Social Connections:   . Frequency of Communication with Friends and Family:   . Frequency of Social Gatherings with Friends and Family:   . Attends Religious Services:   . Active Member of Clubs or Organizations:   . Attends Archivist Meetings:   Mar[place] Kitchen Marital Status:   Intimate Partner Violence:   . Fear of Current or Ex-Partner:   . Emotionally Abused:   Mar[place] Kitchen Physically Abused:   . Sexually Abused:    Family History: History reviewed. No pertinent family history.  Review of Systems: Constitutional: Denies fevers, chills or abnormal weight loss Eyes: Denies blurriness of vision Ears, nose, mouth, throat, and face: Denies mucositis or sore throat Respiratory: Denies cough, dyspnea or wheezes Cardiovascular: Denies palpitation, chest discomfort or lower extremity swelling Gastrointestinal:  Denies nausea, constipation, diarrhea GU: Denies dysuria or incontinence Skin: Denies abnormal skin rashes Neurological: Per HPI Musculoskeletal: Denies joint pain, back or neck discomfort. No decrease in ROM Behavioral/Psych: Denies anxiety, disturbance in thought content, and mood instability  Physical Exam: Vitals:   08/24/19 0942  BP: 136/88  Pulse: 81  Resp: 18  Temp: 99.3 F (37.4 C)  SpO2: 98%   KPS: 70. General: Alert, cooperative, pleasant, in no acute distress Head: Craniotomy scar noted, dry and intact. EENT: No conjunctival injection or scleral icterus. Oral mucosa moist Lungs: Resp effort normal Cardiac: Regular rate and rhythm Abdomen: Soft, non-distended abdomen Skin: No rashes cyanosis or petechiae. Extremities: No clubbing or edema  Neurologic Exam: Mental Status: Awake, alert,  attentive to examiner. Oriented to self and environment. Language demonstrates modest impairments with regards to fluency. Cranial Nerves: Visual acuity is grossly normal. Dense R homonymous hemianopia. Extra-ocular movements intact. No ptosis. Face is symmetric, tongue midline. Motor: Tone and bulk are normal. Power is 3/5 in right arm and leg, 5/5 on left side. Reflexes are symmetric, no pathologic reflexes present. Sensory: Intact to light touch and temperature Gait: Dystaxic, hemiparetic  Labs: I have reviewed the data as listed    Component Value Date/Time   NA 136 08/24/2019 0912   K 4.2 08/24/2019 0912   CL 103 08/24/2019 0912   CO2 26 08/24/2019 0912   GLUCOSE 87 08/24/2019 0912   BUN 13 08/24/2019 0912   CREATININE 0.88 08/24/2019 0912   CALCIUM 9.6 08/24/2019 0912   PROT 7.6 08/24/2019 0912   ALBUMIN 3.5 08/24/2019 0912   AST 11 (L) 08/24/2019 0912   ALT 15 08/24/2019 0912   ALKPHOS 98 08/24/2019 0912   BILITOT 0.4 08/24/2019 0912   GFRNONAA >60 08/24/2019 0912   GFRAA >60 08/24/2019 0912   Lab Results  Component Value Date   WBC 10.1 08/24/2019  NEUTROABS 7.4 08/24/2019   HGB 14.5 08/24/2019   HCT 44.4 08/24/2019   MCV 89.7 08/24/2019   PLT 114 (L) 08/24/2019   Imaging:  [facility] Clinician Interpretation: I have personally reviewed the CNS images as listed.  My interpretation, in the context of the patient's clinical presentation, is progressive disease  MR BRAIN W WO CONTRAST  Result Date: 08/21/2019 CLINICAL DATA:  24 year old male with left temporal glioblastoma status post resection in September 2020, postoperative radiation using IMRT.  Continues on Temozolomide. Fluctuating enhancement along the left lateral ventricle and posterior limb left internal capsule this year. EXAM: MRI HEAD WITHOUT AND WITH CONTRAST TECHNIQUE: Multiplanar, multiecho pulse sequences of the brain and surrounding structures were obtained without and with intravenous contrast. CONTRAST:   80m MULTIHANCE GADOBENATE DIMEGLUMINE 529 MG/ML IV SOLN COMPARISON:  MRI 07/23/2019 and earlier. FINDINGS: Brain: There is a new 10 mm mildly spiculated and nodular area of enhancement along the genu of the corpus callosum to the right of midline (series 13, image 88) associated with new diffusion restriction here (series 3, image 77). Mild new associated T2 and FLAIR hyperintensity there (series 7, image 12). The left occipital horn periventricular/subependymal persists, and has increased mildly medial to the ventricle as seen on series 13, image 76 and image 80, with a similar new focus of restricted diffusion there (series 3, image 76). Encephalomalacia and FLAIR hyperintensity in the left temporal lobe is stable. T2 and FLAIR hyperintensity tracking to the left occipital pole is stable. However, T2 and FLAIR hyperintensity in the left posterior corona radiata has mildly increased on series 8, image 22 and series 12, image 16. Additionally, there is new since April faint T2 and FLAIR hyperintensity in the contralateral right posterior corona radiata (also on image 22). No significant mass effect. At the same time enhancement at the residual posterior limb left internal capsule has resolved, with perhaps mildly increased cystic encephalomalacia there. 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 No superimposed restricted diffusion suggestive of acute infarction. No midline shift, mass effect, ventriculomegaly, or acute intracranial hemorrhage. Cervicomedullary junction and pituitary are within normal limits. Vascular: Major intracranial vascular flow voids are stable. The major dural venous sinuses are enhancing and remain patent. Skull and upper cervical spine: Stable changes of left frontotemporal craniotomy. Normal underlying bone marrow signal. Negative visible cervical spine and spinal cord. Sinuses/Orbits: Stable, negative. Other: Middle ears and  mastoids remain negative. IMPRESSION: 1. Suspicious new nodular diffusion abnormality, mild T2 hyperintensity and stellate enhancement at the RIGHT genu of the corpus callosum (series 13, image 88). Although there has been similar increase in the LEFT subependymal enhancement noted in July (see #2), if this parenchyma was outside of of the radiation treatment site then true progression is strongly favored. 2. Subtle increased enhancement and new DWI abnormality in the left lateral ventricle subependymal area described last month. This is suspicious, although remains indeterminate (see #3). And subtle increased T2 signal abnormality in the BILATERAL posterior corona radiata, also suspicious for true progression. 3. Completely resolved enhancement of the posterior limb left internal capsule since April. 4. 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 Electronically Signed   By: HGenevie AnnM.D.   On: 08/21/2019 11:51    Assessment/Plan Glioblastoma multiforme of temporal lobe ([place] [C71.2]   ABlenda Mountsremains clinically stable today following cycle #6 of 5-day Temozolomide.  MRI re-demonstrates new region of enhancement adjacent to the left lateral ventricle, which lies on the edge  of the low-dose radiation field.  Additionally demonstrated on today's scan is a previously un-visualized nodular focus of enhancement within ventral aspect of corpus callosum.  Together these findings are highly suggestive of frank tumor progression.  We recommended deferring next cycle of Temozolomide and will help him search for best available clinical trial.  Based on preliminary review of available studies, referrals will be placed for consultation locally to Dr. Vedia Coffer at [facility], and remotely at [facility] in [place].     Will continue to work aggressively with neuro-directed PT and OT.  He has been greatly helped by his significant gains made through therapy.  We will continue  to follow along closely, and can offer salvage chemotherapy if not eligible for study.   All questions were answered. The patient knows to call the clinic with any problems, questions or concerns. No barriers to learning were detected.  I have spent a total of 40 minutes of face-to-face and non-face-to-face time, excluding clinical staff time, preparing to see patient, ordering tests and/or medications, counseling the patient, and independently interpreting results and communicating results to the patient/family/caregiver   Ventura Sellers, MD Medical Director of Neuro-Oncology [facility] [place] 08/24/19 3:55 PM

## 2019-08-26 ENCOUNTER — Telehealth: Payer: Self-pay | Admitting: Internal Medicine

## 2019-08-26 NOTE — Telephone Encounter (Signed)
No 8/9 los 

## 2019-09-04 ENCOUNTER — Other Ambulatory Visit: Payer: Self-pay | Admitting: Physical Medicine and Rehabilitation

## 2019-09-15 ENCOUNTER — Encounter: Payer: 59 | Admitting: Physical Medicine and Rehabilitation

## 2019-09-18 ENCOUNTER — Other Ambulatory Visit: Payer: Self-pay | Admitting: Internal Medicine

## 2019-09-18 ENCOUNTER — Telehealth: Payer: Self-pay | Admitting: *Deleted

## 2019-09-18 DIAGNOSIS — C712 Malignant neoplasm of temporal lobe: Secondary | ICD-10-CM

## 2019-09-18 NOTE — Telephone Encounter (Signed)
Ms. Trueba called office -states they are traveling back from [place] (state).They expect to be back in [facility] Tuesday 9/7.  During trip, patient began vomiting on 8/22. Able to keep fluids and some food down - vomiting 1-2x a day. Ondansetron as ordered did not help much. They took him to an Emergency Dept on Monday 8/31 (visit notes in his chart via [facility]). According to mother, he received IV fluids, a CT was done and he was discharged with Metoclopramide prescription. Still vomiting, about once per day. Continues to be able to keep fluids and some food down.  Ms. Biermann wants to know why he is vomiting.  Contacted Ms. Mostafa - left voice mail on named VM: Dr. [name] will be given information received in messages.  Call information routed to Dr. [name]

## 2019-09-22 ENCOUNTER — Encounter: Payer: Self-pay | Admitting: Internal Medicine

## 2019-09-22 ENCOUNTER — Other Ambulatory Visit: Payer: Self-pay | Admitting: Internal Medicine

## 2019-09-22 ENCOUNTER — Encounter: Payer: Self-pay | Admitting: *Deleted

## 2019-09-22 MED ORDER — OLANZAPINE 5 MG PO TABS: 5.0000 mg | ORAL_TABLET | Freq: Every day | ORAL | 0 refills | Status: DC

## 2019-09-24 ENCOUNTER — Other Ambulatory Visit: Payer: Self-pay

## 2019-09-24 ENCOUNTER — Inpatient Hospital Stay: Payer: 59 | Attending: Internal Medicine | Admitting: Internal Medicine

## 2019-09-24 VITALS — BP 118/77 | HR 117 | Temp 97.5°F | Resp 17 | Ht 69.0 in | Wt 175.0 lb

## 2019-09-24 DIAGNOSIS — C712 Malignant neoplasm of temporal lobe: Secondary | ICD-10-CM | POA: Diagnosis not present

## 2019-09-24 DIAGNOSIS — R112 Nausea with vomiting, unspecified: Secondary | ICD-10-CM | POA: Diagnosis not present

## 2019-09-24 MED ORDER — LEVETIRACETAM 250 MG PO TABS: 250.0000 mg | ORAL_TABLET | Freq: Two times a day (BID) | ORAL | 3 refills | Status: DC

## 2019-09-24 NOTE — Progress Notes (Signed)
[facility] at [address], [place] [address] [phone]   Interval Evaluation  Date of Service: 09/24/19 Patient Name: Gregory Moreno Patient MRN: [id number] Patient DOB: 1995-02-29 Provider: Ventura Sellers, MD  Identifying Statement:  Gregory Moreno is a 24 y.o. male with left temporal glioblastoma   Referring Provider: [facility] [address] [place],  [place] [address]  Oncologic History: Oncology History  Glioblastoma multiforme of temporal lobe ([place])  10/13/2018 Surgery   Craniotomy, debulking resection and hematoma evacuation with Dr. Zada Finders.   12/02/2018 - 01/13/2019 Radiation Therapy   IMRT and concurrent Temodar 66mm2   02/09/2019 -  Chemotherapy   The patient had temozolomide (TEMODAR) 100 MG capsule, 150 mg/m2/day = 300 mg, Oral, Daily, 1 of 1 cycle, Start date: 06/30/2019, End date: -- Dose modification: 150 mg/m2/day (original dose 150 mg/m2/day, Cycle 1), 200 mg/m2/day (original dose 150 mg/m2/day, Cycle 1)  for chemotherapy treatment.      Biomarkers:  MGMT Methylated.  IDH 1/2 Wild type.  EGFR Not expressed  TERT Unmutated   Interval History:  Gregory HURRELLpresents today for follow up after recent cross country trip with his family. No major changes in motor function, but he has been complaining of sporadic nausea and vomiting, starting ~10 days ago.  Symptoms have improved with zyprexa.  His mother also describes an event overnight described as "trembling, unresponsive, incontinent followed by consfusion for several hours".  Still able to ambulate short distances without use of a cane.  Remains with dense left visual field impairment. No chemo in the past month.  Medications: Current Outpatient Medications on File Prior to Visit  Medication Sig Dispense Refill  . camphor-menthol (SARNA) lotion Apply 1 application topically as needed for itching. 222 mL 0  .  escitalopram (LEXAPRO) 5 MG tablet TAKE 1 TABLET BY MOUTH EVERYDAY AT BEDTIME 90 tablet 1  . hydrOXYzine (ATARAX/VISTARIL) 10 MG tablet Take 1 tablet (10 mg total) by mouth 3 (three) times daily as needed. 30 tablet 0  . OLANZapine (ZYPREXA) 5 MG tablet Take 1 tablet (5 mg total) by mouth at bedtime. 7 tablet 0  . ondansetron (ZOFRAN) 4 MG tablet Take 1 tablet (4 mg total) by mouth every 8 (eight) hours as needed for nausea or vomiting. 30 tablet 1  . ondansetron (ZOFRAN) 8 MG tablet TAKE 1 TABLET BY MOUTH TWICE DAILY AS NEEDED FOR NAUSEA AND VOMITING. MAY TAKE 30-60 MINUTES PRIOR TO TEMODAR ADMINISTRATION IF NAUSEA/VOMITING OCCURS. 30 tablet 0  . temozolomide (TEMODAR) 100 MG capsule TAKE 3 CAPSULES (300 MG TOTAL) BY MOUTH DAILY. MAY TAKE ON AN EMPTY STOMACH TO DECREASE NAUSEA & VOMITING. 15 capsule 0  . [DISCONTINUED] escitalopram (LEXAPRO) 5 MG tablet TAKE 1 TABLET BY MOUTH EVERYDAY AT BEDTIME 30 tablet 0   No current facility-administered medications on file prior to visit.    Allergies: No Known Allergies Past Medical History: No past medical history on file. Past Surgical History:  Past Surgical History:  Procedure Laterality Date  . CRANIOTOMY Left 10/13/2018   Procedure: LEFT CRANIOTOMY FOR TUMOR RESECTION;  Surgeon: OJudith Part MD;  Location[facility]  Service: Neurosurgery;  Laterality: Left;   Social History:  Social History   Socioeconomic History  . Marital status: Single    Spouse name: Not on file  . Number of children: Not on file  . Years of education: Not on file  . Highest education  level: Not on file  Occupational History  . Not on file  Tobacco Use  . Smoking status: Never Smoker  . Smokeless tobacco: Never Used  Substance and Sexual Activity  . Alcohol use: Not on file  . Drug use: Not on file  . Sexual activity: Not on file  Other Topics Concern  . Not on file  Social History Narrative  . Not on file   Social Determinants of Health   Financial  Resource Strain:   . Difficulty of Paying Living Expenses: Not on file  Food Insecurity:   . Worried About Charity fundraiser in the Last Year: Not on file  . Ran Out of Food in the Last Year: Not on file  Transportation Needs:   . Lack of Transportation (Medical): Not on file  . Lack of Transportation (Non-Medical): Not on file  Physical Activity:   . Days of Exercise per Week: Not on file  . Minutes of Exercise per Session: Not on file  Stress:   . Feeling of Stress : Not on file  Social Connections:   . Frequency of Communication with Friends and Family: Not on file  . Frequency of Social Gatherings with Friends and Family: Not on file  . Attends Religious Services: Not on file  . Active Member of Clubs or Organizations: Not on file  . Attends Archivist Meetings: Not on file  . Marital Status: Not on file  Intimate Partner Violence:   . Fear of Current or Ex-Partner: Not on file  . Emotionally Abused: Not on file  . Physically Abused: Not on file  . Sexually Abused: Not on file   Family History: No family history on file.  Review of Systems: Constitutional: Denies fevers, chills or abnormal weight loss Eyes: Denies blurriness of vision Ears, nose, mouth, throat, and face: Denies mucositis or sore throat Respiratory: Denies cough, dyspnea or wheezes Cardiovascular: Denies palpitation, chest discomfort or lower extremity swelling Gastrointestinal:  Denies nausea, constipation, diarrhea GU: Denies dysuria or incontinence Skin: Denies abnormal skin rashes Neurological: Per HPI Musculoskeletal: Denies joint pain, back or neck discomfort. No decrease in ROM Behavioral/Psych: Denies anxiety, disturbance in thought content, and mood instability  Physical Exam: Vitals:   09/24/19 1100  BP: 118/77  Pulse: (!) 117  Resp: 17  Temp: (!) 97.5 F (36.4 C)  SpO2: 98%   KPS: 70. General: Alert, cooperative, pleasant, in no acute distress Head: Craniotomy scar noted,  dry and intact. EENT: No conjunctival injection or scleral icterus. Oral mucosa moist Lungs: Resp effort normal Cardiac: Regular rate and rhythm Abdomen: Soft, non-distended abdomen Skin: No rashes cyanosis or petechiae. Extremities: No clubbing or edema  Neurologic Exam: Mental Status: Awake, alert, attentive to examiner. Oriented to self and environment. Language demonstrates modest impairments with regards to fluency. Cranial Nerves: Visual acuity is grossly normal. Dense R homonymous hemianopia. Extra-ocular movements intact. No ptosis. Face is symmetric, tongue midline. Motor: Tone and bulk are normal. Power is 3/5 in right arm and leg, 5/5 on left side. Reflexes are symmetric, no pathologic reflexes present. Sensory: Intact to light touch and temperature Gait: Dystaxic, hemiparetic  Labs: I have reviewed the data as listed    Component Value Date/Time   NA 136 08/24/2019 0912   K 4.2 08/24/2019 0912   CL 103 08/24/2019 0912   CO2 26 08/24/2019 0912   GLUCOSE 87 08/24/2019 0912   BUN 13 08/24/2019 0912   CREATININE 0.88 08/24/2019 0912   CALCIUM 9.6  08/24/2019 0912   PROT 7.6 08/24/2019 0912   ALBUMIN 3.5 08/24/2019 0912   AST 11 (L) 08/24/2019 0912   ALT 15 08/24/2019 0912   ALKPHOS 98 08/24/2019 0912   BILITOT 0.4 08/24/2019 0912   GFRNONAA >60 08/24/2019 0912   GFRAA >60 08/24/2019 0912   Lab Results  Component Value Date   WBC 10.1 08/24/2019   NEUTROABS 7.4 08/24/2019   HGB 14.5 08/24/2019   HCT 44.4 08/24/2019   MCV 89.7 08/24/2019   PLT 114 (L) 08/24/2019    Assessment/Plan Glioblastoma multiforme of temporal lobe (HCC) [C71.2]   Gregory Moreno presents today with modest clinical changes.  Event described by mom is likely c/w focal seizure; we have recommended starting Keppra 281m BID for seizure prevention.  We don't have a clear explanation for the nausea as has been of cytotoxic chemotherapy.  May continue Zyprexa 564mdaily if efficacious, otherwise  may return to Zofran.  Prior MRI had demonstrated two region of enhancement which were of concern.  [facility] changes did not quite meet RANO criteria so no trial was offered unless further progression occurs radiographically.  We recommended repeating MRI brain in 2 weeks and returning for reconsideration of systemic therapy options.  Will continue to work aggressively with neuro-directed PT and OT.  He has been greatly helped by his significant gains made through therapy.  All questions were answered. The patient knows to call the clinic with any problems, questions or concerns. No barriers to learning were detected.  I have spent a total of 30 minutes of face-to-face and non-face-to-face time, excluding clinical staff time, preparing to see patient, ordering tests and/or medications, counseling the patient, and independently interpreting results and communicating results to the patient/family/caregiver   ZaVentura SellersMD Medical Director of Neuro-Oncology [facility] [place]9/09/21 4:38 PM

## 2019-09-28 ENCOUNTER — Ambulatory Visit: Payer: 59

## 2019-09-30 ENCOUNTER — Telehealth: Payer: Self-pay

## 2019-09-30 ENCOUNTER — Ambulatory Visit: Payer: 59

## 2019-09-30 ENCOUNTER — Other Ambulatory Visit: Payer: Self-pay

## 2019-09-30 ENCOUNTER — Inpatient Hospital Stay: Payer: 59

## 2019-09-30 ENCOUNTER — Ambulatory Visit: Payer: 59 | Admitting: Physical Therapy

## 2019-09-30 ENCOUNTER — Emergency Department (HOSPITAL_COMMUNITY): Payer: 59

## 2019-09-30 ENCOUNTER — Inpatient Hospital Stay (HOSPITAL_BASED_OUTPATIENT_CLINIC_OR_DEPARTMENT_OTHER): Payer: 59 | Admitting: Medical

## 2019-09-30 ENCOUNTER — Inpatient Hospital Stay (HOSPITAL_COMMUNITY)
Admission: EM | Admit: 2019-09-30 | Discharge: 2019-10-03 | DRG: 055 | Disposition: A | Discharge status: Home Health | Payer: 59 | Attending: Internal Medicine | Admitting: Internal Medicine

## 2019-09-30 ENCOUNTER — Encounter (HOSPITAL_COMMUNITY): Payer: Self-pay | Admitting: Internal Medicine

## 2019-09-30 DIAGNOSIS — R112 Nausea with vomiting, unspecified: Secondary | ICD-10-CM | POA: Diagnosis present

## 2019-09-30 DIAGNOSIS — Z20822 Contact with and (suspected) exposure to covid-19: Secondary | ICD-10-CM | POA: Diagnosis present

## 2019-09-30 DIAGNOSIS — C712 Malignant neoplasm of temporal lobe: Secondary | ICD-10-CM

## 2019-09-30 DIAGNOSIS — T380X5A Adverse effect of glucocorticoids and synthetic analogues, initial encounter: Secondary | ICD-10-CM | POA: Diagnosis not present

## 2019-09-30 DIAGNOSIS — R569 Unspecified convulsions: Secondary | ICD-10-CM

## 2019-09-30 DIAGNOSIS — C719 Malignant neoplasm of brain, unspecified: Secondary | ICD-10-CM | POA: Diagnosis present

## 2019-09-30 DIAGNOSIS — R4182 Altered mental status, unspecified: Secondary | ICD-10-CM | POA: Diagnosis not present

## 2019-09-30 DIAGNOSIS — E860 Dehydration: Secondary | ICD-10-CM | POA: Diagnosis present

## 2019-09-30 DIAGNOSIS — R627 Adult failure to thrive: Secondary | ICD-10-CM | POA: Diagnosis present

## 2019-09-30 DIAGNOSIS — D72829 Elevated white blood cell count, unspecified: Secondary | ICD-10-CM | POA: Diagnosis not present

## 2019-09-30 LAB — CBC WITH DIFFERENTIAL/PLATELET
Abs Immature Granulocytes: 0.04 10*3/uL (ref 0.00–0.07)
Basophils Absolute: 0 10*3/uL (ref 0.0–0.1)
Basophils Relative: 0 %
Eosinophils Absolute: 0 10*3/uL (ref 0.0–0.5)
Eosinophils Relative: 0 %
HCT: 47.4 % (ref 39.0–52.0)
Hemoglobin: 15.4 g/dL (ref 13.0–17.0)
Immature Granulocytes: 1 %
Lymphocytes Relative: 11 %
Lymphs Abs: 0.9 10*3/uL (ref 0.7–4.0)
MCH: 30 pg (ref 26.0–34.0)
MCHC: 32.5 g/dL (ref 30.0–36.0)
MCV: 92.4 fL (ref 80.0–100.0)
Monocytes Absolute: 0.5 10*3/uL (ref 0.1–1.0)
Monocytes Relative: 6 %
Neutro Abs: 6.9 10*3/uL (ref 1.7–7.7)
Neutrophils Relative %: 82 %
Platelets: 209 10*3/uL (ref 150–400)
RBC: 5.13 MIL/uL (ref 4.22–5.81)
RDW: 15.1 % (ref 11.5–15.5)
WBC: 8.4 10*3/uL (ref 4.0–10.5)
nRBC: 0 % (ref 0.0–0.2)

## 2019-09-30 LAB — COMPREHENSIVE METABOLIC PANEL
ALT: 25 U/L (ref 0–44)
AST: 18 U/L (ref 15–41)
Albumin: 4.1 g/dL (ref 3.5–5.0)
Alkaline Phosphatase: 73 U/L (ref 38–126)
Anion gap: 12 (ref 5–15)
BUN: 17 mg/dL (ref 6–20)
CO2: 29 mmol/L (ref 22–32)
Calcium: 9.7 mg/dL (ref 8.9–10.3)
Chloride: 100 mmol/L (ref 98–111)
Creatinine, Ser: 0.84 mg/dL (ref 0.61–1.24)
GFR calc Af Amer: >60 mL/min (ref >60)
GFR calc non Af Amer: >60 mL/min (ref >60)
Glucose, Bld: 96 mg/dL (ref 70–99)
Potassium: 4.2 mmol/L (ref 3.5–5.1)
Sodium: 141 mmol/L (ref 135–145)
Total Bilirubin: 0.7 mg/dL (ref 0.3–1.2)
Total Protein: 8.1 g/dL (ref 6.5–8.1)

## 2019-09-30 LAB — SARS CORONAVIRUS 2 BY RT PCR (HOSPITAL ORDER, PERFORMED IN ~~LOC~~ HOSPITAL LAB): SARS Coronavirus 2: NEGATIVE

## 2019-09-30 LAB — LIPASE, BLOOD: Lipase: 28 U/L (ref 11–51)

## 2019-09-30 MED ORDER — LACTATED RINGERS IV BOLUS
1000.0000 mL | Freq: Once | INTRAVENOUS | Status: AC
  Administered 2019-09-30: 1000 mL via INTRAVENOUS

## 2019-09-30 MED ORDER — ACETAMINOPHEN 650 MG RE SUPP: 650.0000 mg | Freq: Four times a day (QID) | RECTAL | Status: DC | PRN

## 2019-09-30 MED ORDER — DEXAMETHASONE SODIUM PHOSPHATE 4 MG/ML IJ SOLN
4.0000 mg | Freq: Four times a day (QID) | INTRAMUSCULAR | Status: DC
  Administered 2019-09-30 – 2019-10-03 (×10): 4 mg via INTRAVENOUS
  Filled 2019-09-30 (×10): qty 1

## 2019-09-30 MED ORDER — ENSURE ENLIVE PO LIQD
237.0000 mL | Freq: Two times a day (BID) | ORAL | Status: DC
  Administered 2019-10-01 – 2019-10-03 (×5): 237 mL via ORAL

## 2019-09-30 MED ORDER — DEXTROSE-NACL 5-0.9 % IV SOLN: INTRAVENOUS | Status: DC

## 2019-09-30 MED ORDER — ACETAMINOPHEN 325 MG PO TABS: 650.0000 mg | ORAL_TABLET | Freq: Four times a day (QID) | ORAL | Status: DC | PRN

## 2019-09-30 MED ORDER — GADOBUTROL 1 MMOL/ML IV SOLN
7.5000 mL | Freq: Once | INTRAVENOUS | Status: AC | PRN
  Administered 2019-09-30: 7.5 mL via INTRAVENOUS

## 2019-09-30 MED ORDER — ONDANSETRON HCL 4 MG/2ML IJ SOLN: 4.0000 mg | Freq: Four times a day (QID) | INTRAMUSCULAR | Status: DC | PRN

## 2019-09-30 MED ORDER — ONDANSETRON HCL 4 MG PO TABS: 4.0000 mg | ORAL_TABLET | Freq: Four times a day (QID) | ORAL | Status: DC | PRN

## 2019-09-30 NOTE — ED Triage Notes (Signed)
Patient arrived from [facility]

## 2019-09-30 NOTE — ED Provider Notes (Signed)
[facility] DEPT Provider Note   CSN: [id number] Arrival date & time: 09/30/19  1551     History Chief Complaint  Patient presents with  . Emesis  . Altered Mental Status    Gregory Moreno  is a 24 y.o. male. Level 5 caveat due to difficulty speaking/confusion. HPI Patient presents with nausea vomiting weakness and overall worsening status.  Has a known glioblastoma post surgery and post chemotherapy.  For the last 3 weeks patient has been doing worse.  Has had vomiting and did have a seizure.  Had been seen at outside hospital.  Reportedly had CT scan done at that time that looked bad but sort of unknown chronicity.  Since then patient is continued to vomit.  Seen by Dr. [name] the neuro oncologist 6 days ago.  Had some adjustment of medications but has continued to vomit.  Patient has continued to worsen.  Usually will walk with a cane but has had to go to a wheelchair.  Increasing confusion.  Oncology is seen patient and reportedly was hoping to get MRI done sooner.  Had been scheduled for 10 days from now.  However patient not tolerating being at home very well.  Most of the history comes from patient's father.    No past medical history on file.  Patient Active Problem List   Diagnosis Date Noted  . Goals of care, counseling/discussion 12/16/2018  . Palliative care by specialist   . 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 Spastic hemiparesis ([place])   . Cerebral edema (HCC)   . Intracranial tumor ([place])   . Steroid-induced hyperglycemia   . 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 Hyponatremia   . Transaminitis   . Leucocytosis   . 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 Dysphagia 10/21/2018  . 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 Brain mass   . ICH (intracerebral hemorrhage) ([place]) 10/13/2018    Past Surgical History:  Procedure Laterality Date  . CRANIOTOMY Left 10/13/2018   Procedure: LEFT  CRANIOTOMY FOR TUMOR RESECTION;  Surgeon: Judith Part, MD;  Location: [facility];  Service: Neurosurgery;  Laterality: Left;       No family history on file.  Social History   Tobacco Use  . Smoking status: Never Smoker  . Smokeless tobacco: Never Used  Substance Use Topics  . Alcohol use: Not on file  . Drug use: Not on file    Home Medications Prior to Admission medications   Medication Sig Start Date End Date Taking? Authorizing Provider  camphor-menthol Timoteo Ace) lotion Apply 1 application topically as needed for itching. 06/04/19   Raulkar, Clide Deutscher, MD  escitalopram (LEXAPRO) 5 MG tablet TAKE 1 TABLET BY MOUTH EVERYDAY AT BEDTIME 09/04/19   Raulkar, Clide Deutscher, MD  hydrOXYzine (ATARAX/VISTARIL) 10 MG tablet Take 1 tablet (10 mg total) by mouth 3 (three) times daily as needed. 07/31/19   Raulkar, Clide Deutscher, MD  levETIRAcetam (KEPPRA) 250 MG tablet Take 1 tablet (250 mg total) by mouth 2 (two) times daily. 09/24/19   Vaslow, Acey Lav, MD  OLANZapine (ZYPREXA) 5 MG tablet Take 1 tablet (5 mg total) by mouth at bedtime. 09/22/19   Vaslow, Acey Lav, MD  ondansetron (ZOFRAN) 4 MG tablet Take 1 tablet (4 mg total) by mouth every 8 (eight) hours as needed for nausea or vomiting. 05/14/19   Vaslow, Acey Lav, MD  ondansetron (ZOFRAN) 8 MG tablet TAKE 1 TABLET BY MOUTH TWICE DAILY  AS NEEDED FOR NAUSEA AND VOMITING. MAY TAKE 30-60 MINUTES PRIOR TO TEMODAR ADMINISTRATION IF NAUSEA/VOMITING OCCURS. 03/31/19   Vaslow, Acey Lav, MD  temozolomide (TEMODAR) 100 MG capsule TAKE 3 CAPSULES (300 MG TOTAL) BY MOUTH DAILY. MAY TAKE ON AN EMPTY STOMACH TO DECREASE NAUSEA & VOMITING. 07/28/19   Ventura Sellers, MD  escitalopram (LEXAPRO) 5 MG tablet TAKE 1 TABLET BY MOUTH EVERYDAY AT BEDTIME 02/20/19   Raulkar, Clide Deutscher, MD    Allergies    Patient has no known allergies.  Review of Systems   Review of Systems  Unable to perform ROS: Mental status change    Physical Exam Updated Vital Signs BP  123/76 (BP Location: Left Arm)   Pulse 66   Temp 98.9 F (37.2 C) (Oral)   Resp (!) 21   Ht 5\' 9"  (1.753 m)   Wt 79.4 kg   SpO2 99%   BMI 25.84 kg/m   Physical Exam Vitals reviewed.  HENT:     Head: Atraumatic.  Eyes:     General: No scleral icterus. Cardiovascular:     Rate and Rhythm: Regular rhythm.  Pulmonary:     Breath sounds: No wheezing or rhonchi.  Abdominal:     Tenderness: There is no abdominal tenderness.  Musculoskeletal:        General: No tenderness.     Cervical back: Neck supple.  Skin:    General: Skin is warm.     Capillary Refill: Capillary refill takes less than 2 seconds.  Neurological:     Mental Status: He is alert.     Comments: Chronic right-sided weakness.  Slow to answer.  Will answer questions but does have some mild confusion.     ED Results / Procedures / Treatments   Labs (all labs ordered are listed, but only abnormal results are displayed) Labs Reviewed  SARS CORONAVIRUS 2 BY RT PCR (HOSPITAL ORDER, [facility])  COMPREHENSIVE METABOLIC PANEL  LIPASE, BLOOD  CBC WITH DIFFERENTIAL/PLATELET    EKG None  Radiology No results found.  Procedures Procedures (including critical care time)  Medications Ordered in ED Medications  lactated ringers bolus 1,000 mL (has no administration in time range)    ED Course  I have reviewed the triage vital signs and the nursing notes.  Pertinent labs & imaging results that were available during my care of the patient were reviewed by me and considered in my medical decision making (see chart for details).    MDM Rules/Calculators/A&P                          Patient presents with worsening mental status.  History of severe glioblastoma.  Has been declining over the last 2 to 3 weeks.  Now unable to walk with a cane and having to use a wheelchair.  Also vomiting and not tolerating orals well.  MRI been scheduled another 10 days but done today and showed severe  progression of disease.  Discussed with Dr. [name].  Will admit patient to hospital since not eating well and not tolerating at home very well currently.  Will be seen by Dr. [name] in the morning tomorrow. Final Clinical Impression(s) / ED Diagnoses Final diagnoses:  None    Rx / DC Orders ED Discharge Orders    None       Davonna Belling, MD 09/30/19 1921

## 2019-09-30 NOTE — Telephone Encounter (Signed)
Received TC back from patients mom stating that pt will be at appointment today at 2pm. Infusion also made aware.

## 2019-09-30 NOTE — Progress Notes (Signed)
[facility] Progress Note   Gregory Moreno [id number] 1996-01-15 24 y.o.  Gregory Moreno is managed by Dr. 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  He continues on Temodar under the care of Dr. Cecil Cobbs.  He was scheduled to have a restaging MRI of the brain on 10/10/2019 but was transferred to the emergency room from the [facility] after report of a significant decline in the patient's performance status over the past several days.  Please see After Visit Summary for patient specific instructions.  Future Appointments  Date Time Provider [place]  10/01/2019 11:30 AM [name], Acey Lav, MD [facility] None  10/02/2019 10:20 AM Raulkar, Clide Deutscher, MD [facility] CPR  10/05/2019  1:15 PM Sharen Counter, CCC-SLP [facility]  10/05/2019  3:00 PM Guido Sander, PT [facility]  10/07/2019  1:15 PM Schinke, Perry Mount, CCC-SLP [facility]NR [facility]  10/10/2019  1:40 PM GI-315 MR 2 GI-315MRI GI-[address] [facility]  10/12/2019  1:15 PM Schinke, Perry Mount, CCC-SLP [facility]-NR O[facility]NR  10/14/2019  1:15 PM Sharen Counter CCC-SLP [facility]NR [facility]  10/19/2019  1:15 PM Sharen Counter, CCC-SLP [facility]  10/21/2019  1:15 PM Lovvorn, Hewitt Shorts, CCC-SLP [facility]-NR O[facility]NR    No orders of the defined types were placed in this encounter.      Subjective:   Patient ID:  Gregory Moreno is a 24 y.o. (DOB [birth year]) male.  Chief Complaint: No chief complaint on file.   HPI Gregory Moreno is a 24 y.o. male with a diagnosis of a Glioblastoma multiforme of the temporal lobe.  He is status post a debulking surgery and radiation.  He continues on Temodar under the care  of Dr. Cecil Cobbs.  He was scheduled to have a restaging MRI of the brain on 10/10/2019 but was transferred to the emergency room from the [facility] after report of a significant decline in the patient's performance status over the past several days.  According to the patient's father who accompanies him today, Gregory Moreno has been working with PT and OT and was ambulating independently as late as this past Saturday when his father last saw him before traveling out of town.  His father reports that he was found earlier today lying in his bed after having vomited on himself.  He was unable to get out of bed and care for himself.  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 Zada Finders.   12/02/2018 - 01/13/2019 Radiation Therapy   IMRT and concurrent Temodar 62m/m2   02/09/2019 -  Chemotherapy   The patient had temozolomide (TEMODAR) 100 MG capsule, 150 mg/m2/day = 300 mg, Oral, Daily, 1 of 1 cycle, Start date: 06/30/2019, End date: -- Dose modification: 150 mg/m2/day (original dose 150 mg/m2/day, Cycle 1), 200 mg/m2/day (original dose 150 mg/m2/day, Cycle 1)  for chemotherapy treatment.       Medications: I have reviewed the patient's current medications.  Allergies: No Known Allergies  No past medical history on file.  Past Surgical History:  Procedure  Laterality Date  . CRANIOTOMY Left 10/13/2018   Procedure: LEFT CRANIOTOMY FOR TUMOR RESECTION;  Surgeon: Judith Part, MD;  Location: [facility];  Service: Neurosurgery;  Laterality: Left;    No family history on file.  Social History   Socioeconomic History  . Marital status: Single    Spouse name: Not on file  . Number of children: Not on file  . Years of education: Not on file  . Highest education level: Not on file    Occupational History  . Not on file  Tobacco Use  . Smoking status: Never Smoker  . Smokeless tobacco: Never Used  Substance and Sexual Activity  . Alcohol use: Not on file  . Drug use: Not on file  . Sexual activity: Not on file  Other Topics Concern  . Not on file  Social History Narrative  . Not on file   Social Determinants of Health   Financial Resource Strain:   . Difficulty of Paying Living Expenses: Not on file  Food Insecurity:   . Worried About Charity fundraiser in the Last Year: Not on file  . Ran Out of Food in the Last Year: Not on file  Transportation Needs:   . Lack of Transportation (Medical): Not on file  . Lack of Transportation (Non-Medical): Not on file  Physical Activity:   . Days of Exercise per Week: Not on file  . Minutes of Exercise per Session: Not on file  Stress:   . Feeling of Stress : Not on file  Social Connections:   . Frequency of Communication with Friends and Family: Not on file  . Frequency of Social Gatherings with Friends and Family: Not on file  . Attends Religious Services: Not on file  . Active Member of Clubs or Organizations: Not on file  . Attends Archivist Meetings: Not on file  . Marital Status: Not on file  Intimate Partner Violence:   . Fear of Current or Ex-Partner: Not on file  . Emotionally Abused: Not on file  . Physically Abused: Not on file  . Sexually Abused: Not on file    Past Medical History, Surgical history, Social history, and Family history were reviewed and updated as appropriate.   Please see review of systems for further details on the patient's review from today.   Review of Systems:  Review of Systems  Constitutional: Positive for activity change.  HENT: Positive for voice change.   Gastrointestinal: Positive for vomiting.  Musculoskeletal: Positive for gait problem.  Neurological: Positive for tremors (Right lower extremity), speech difficulty and weakness (Right-sided weakness).     Objective:   Physical Exam:  There were no vitals taken for this visit. ECOG: 2  Physical Exam Constitutional:      General: He is not in acute distress.    Appearance: He is not ill-appearing, toxic-appearing or diaphoretic.  HENT:     Head: Normocephalic.  Eyes:     General: No scleral icterus.       Right eye: No discharge.        Left eye: No discharge.     Conjunctiva/sclera: Conjunctivae normal.  Skin:    General: Skin is warm and dry.     Coloration: Skin is not jaundiced or pale.  Neurological:     Mental Status: He is alert and oriented to person, place, and time.     Motor: Weakness (right sided weakness) present.     Gait: Gait abnormal.  Lab Review:     Component Value Date/Time   NA 141 09/30/2019 1617   K 4.2 09/30/2019 1617   CL 100 09/30/2019 1617   CO2 29 09/30/2019 1617   GLUCOSE 96 09/30/2019 1617   BUN 17 09/30/2019 1617   CREATININE 0.84 09/30/2019 1617   CREATININE 0.88 08/24/2019 0912   CALCIUM 9.7 09/30/2019 1617   PROT 8.1 09/30/2019 1617   ALBUMIN 4.1 09/30/2019 1617   AST 18 09/30/2019 1617   AST 11 (L) 08/24/2019 0912   ALT 25 09/30/2019 1617   ALT 15 08/24/2019 0912   ALKPHOS 73 09/30/2019 1617   BILITOT 0.7 09/30/2019 1617   BILITOT 0.4 08/24/2019 0912   GFRNONAA >60 09/30/2019 1617   GFRNONAA >60 08/24/2019 0912   GFRAA >60 09/30/2019 1617   GFRAA >60 08/24/2019 0912       Component Value Date/Time   WBC 8.4 09/30/2019 1617   RBC 5.13 09/30/2019 1617   HGB 15.4 09/30/2019 1617   HGB 14.5 08/24/2019 0912   HCT 47.4 09/30/2019 1617   PLT 209 09/30/2019 1617   PLT 114 (L) 08/24/2019 0912   MCV 92.4 09/30/2019 1617   MCH 30.0 09/30/2019 1617   MCHC 32.5 09/30/2019 1617   RDW 15.1 09/30/2019 1617   LYMPHSABS 0.9 09/30/2019 1617   MONOABS 0.5 09/30/2019 1617   EOSABS 0.0 09/30/2019 1617   BASOSABS 0.0 09/30/2019 1617   -------------------------------  Imaging from last 24 hours (if applicable):  Radiology  interpretation: No results found.

## 2019-09-30 NOTE — ED Notes (Signed)
Pt has hx of glioblastoma. [name] father left the pt on Saturday, retuning today. Pt has quickly declined since. Pt had emesis and couldn't move to clean himself. Pt was scheduled for an MRI in several weeks, but cancer center was hoping to do it sooner and have him admited.

## 2019-09-30 NOTE — H&P (Signed)
History and Physical    Gregory Moreno LPF:[id number] DOB: February 22, 1995 DOA: 09/30/2019  PCP: [place], [place]  Patient coming from: Home.  Chief Complaint: Nausea vomiting and weakness.  HPI: Gregory Moreno is a 24 y.o. 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 [name] neuro oncologist had just recently followed up on September 24, 2019 at the time was started on Keppra for possible focal seizures and planned to have a repeat MRI within 2 weeks started getting very weak and nausea vomiting with profound weakness was brought to the ER.  ED Course: In the ER patient had MRI of the brain which showed significant progression of his glioblastoma.  Labs were unremarkable Covid test was negative.  Dr. [name] was consulted.  Patient admitted for further observation.  Review of Systems: As per HPI, rest all negative.   History reviewed. No pertinent past medical history.  Past Surgical History:  Procedure Laterality Date  . CRANIOTOMY Left 10/13/2018   Procedure: LEFT CRANIOTOMY FOR TUMOR RESECTION;  Surgeon: Judith Part, MD;  Location: [facility];  Service: Neurosurgery;  Laterality: Left;     reports that he has never smoked. He has never used smokeless tobacco. No history on file for alcohol use and drug use.  No Known Allergies  History reviewed. No pertinent family history.  Prior to Admission medications   Medication Sig Start Date End Date Taking? Authorizing Provider  escitalopram (LEXAPRO) 5 MG tablet TAKE 1 TABLET BY MOUTH EVERYDAY AT BEDTIME Patient taking differently: Take 5 mg by mouth at bedtime.  09/04/19  Yes Raulkar, Clide Deutscher, MD  levETIRAcetam (KEPPRA) 250 MG tablet Take 1 tablet (250 mg total) by mouth 2 (two) times daily. 09/24/19  Yes Vaslow, Acey Lav, MD  camphor-menthol [facility]) lotion Apply 1 application topically as needed for itching. Patient not taking: Reported on  09/30/2019 06/04/19   Izora Ribas, MD  hydrOXYzine (ATARAX/VISTARIL) 10 MG tablet Take 1 tablet (10 mg total) by mouth 3 (three) times daily as needed. Patient not taking: Reported on 09/30/2019 07/31/19   Raulkar, Clide Deutscher, MD  OLANZapine (ZYPREXA) 5 MG tablet Take 1 tablet (5 mg total) by mouth at bedtime. Patient not taking: Reported on 09/30/2019 09/22/19   Ventura Sellers, MD  ondansetron (ZOFRAN) 4 MG tablet Take 1 tablet (4 mg total) by mouth every 8 (eight) hours as needed for nausea or vomiting. Patient not taking: Reported on 09/30/2019 05/14/19   Ventura Sellers, MD  ondansetron (ZOFRAN) 8 MG tablet TAKE 1 TABLET BY MOUTH TWICE DAILY AS NEEDED FOR NAUSEA AND VOMITING. MAY TAKE 30-60 MINUTES PRIOR TO TEMODAR ADMINISTRATION IF NAUSEA/VOMITING OCCURS. Patient not taking: Reported on 09/30/2019 03/31/19   Ventura Sellers, MD  temozolomide (TEMODAR) 100 MG capsule TAKE 3 CAPSULES (300 MG TOTAL) BY MOUTH DAILY. MAY TAKE ON AN EMPTY STOMACH TO DECREASE NAUSEA & VOMITING. Patient not taking: Reported on 09/30/2019 07/28/19   Ventura Sellers, MD  escitalopram (LEXAPRO) 5 MG tablet TAKE 1 TABLET BY MOUTH EVERYDAY AT BEDTIME 02/20/19   Raulkar, Clide Deutscher, MD    Physical Exam: Constitutional: Moderately built and nourished. Vitals:   09/30/19 1600 09/30/19 1623 09/30/19 1745  BP:  123/76 122/84  Pulse:  66 (!) 52  Resp:  (!) 21 13  Temp:  98.9 F (37.2 C)   TempSrc:  Oral   SpO2:  99% 99%  Weight: 79.4 kg  Height: 5\' 9"  (1.753 m)     Eyes: Anicteric no pallor.  Has poor vision on the left eye. ENMT: No discharge from the ears eyes nose or mouth. Neck: No mass felt.  No neck rigidity. Respiratory: No rhonchi or crepitations. Cardiovascular: S1-S2 heard. Abdomen: Soft nontender bowel sounds present. Musculoskeletal: No edema. Skin: No rash. Neurologic: Alert awake oriented to time place and person.  Has right-sided weakness and left-sided patient is poor. Psychiatric: Appears  normal.  Normal affect.   Labs on Admission: I have personally reviewed following labs and imaging studies  CBC: Recent Labs  Lab 09/30/19 1617  WBC 8.4  NEUTROABS 6.9  HGB 15.4  HCT 47.4  MCV 92.4  PLT 389   Basic Metabolic Panel: Recent Labs  Lab 09/30/19 1617  NA 141  K 4.2  CL 100  CO2 29  GLUCOSE 96  BUN 17  CREATININE 0.84  CALCIUM 9.7   GFR: Estimated Creatinine Clearance: 136.8 mL/min (by C-G formula based on SCr of 0.84 mg/dL). Liver Function Tests: Recent Labs  Lab 09/30/19 1617  AST 18  ALT 25  ALKPHOS 73  BILITOT 0.7  PROT 8.1  ALBUMIN 4.1   Recent Labs  Lab 09/30/19 1617  LIPASE 28   No results for input(s): AMMONIA in the last 168 hours. Coagulation Profile: No results for input(s): INR, PROTIME in the last 168 hours. Cardiac Enzymes: No results for input(s): CKTOTAL, CKMB, CKMBINDEX, TROPONINI in the last 168 hours. BNP (last 3 results) No results for input(s): PROBNP in the last 8760 hours. HbA1C: No results for input(s): HGBA1C in the last 72 hours. CBG: No results for input(s): GLUCAP in the last 168 hours. Lipid Profile: No results for input(s): CHOL, HDL, LDLCALC, TRIG, CHOLHDL, LDLDIRECT in the last 72 hours. Thyroid Function Tests: No results for input(s): TSH, T4TOTAL, FREET4, T3FREE, THYROIDAB in the last 72 hours. Anemia Panel: No results for input(s): VITAMINB12, FOLATE, FERRITIN, TIBC, IRON, RETICCTPCT in the last 72 hours. Urine analysis:    Component Value Date/Time   COLORURINE YELLOW 11/21/2018 1800   APPEARANCEUR CLEAR 11/21/2018 1800   LABSPEC 1.026 11/21/2018 1800   PHURINE 6.0 11/21/2018 1800   GLUCOSEU NEGATIVE 11/21/2018 1800   HGBUR NEGATIVE 11/21/2018 1800   BILIRUBINUR NEGATIVE 11/21/2018 1800   KETONESUR NEGATIVE 11/21/2018 1800   PROTEINUR NEGATIVE 11/21/2018 1800   NITRITE NEGATIVE 11/21/2018 1800   LEUKOCYTESUR NEGATIVE 11/21/2018 1800   Sepsis  Labs: @LABRCNTIP (procalcitonin:4,lacticidven:4) ) Recent Results (from the past 240 hour(s))  SARS Coronavirus 2 by RT PCR (hospital order, performed in [place] hospital lab) Nasopharyngeal Nasopharyngeal Swab     Status: None   Collection Time: 09/30/19  4:19 PM   Specimen: Nasopharyngeal Swab  Result Value Ref Range Status   SARS Coronavirus 2 NEGATIVE NEGATIVE Final    Comment: (NOTE) SARS-CoV-2 target nucleic acids are NOT DETECTED.  The SARS-CoV-2 RNA is generally detectable in upper and lower respiratory specimens during the acute phase of infection. The lowest concentration of SARS-CoV-2 viral copies this assay can detect is 250 copies / mL. A negative result does not preclude SARS-CoV-2 infection and should not be used as the sole basis for treatment or other patient management decisions.  A negative result may occur with improper specimen collection / handling, submission of specimen other than nasopharyngeal swab, presence of viral mutation(s) within the areas targeted by this assay, and inadequate number of viral copies (<250 copies / mL). A negative result must be combined with clinical observations,  patient history, and epidemiological information.  Fact Sheet for Patients:   StrictlyIdeas.no  Fact Sheet for Healthcare Providers: BankingDealers.co.za  This test is not yet approved or  cleared by the [place] FDA and has been authorized for detection and/or diagnosis of SARS-CoV-2 by FDA under an Emergency Use Authorization (EUA).  This EUA will remain in effect (meaning this test can be used) for the duration of the COVID-19 declaration under Section 564(b)(1) of the Act, 21 U.S.C. section 360bbb-3(b)(1), unless the authorization is terminated or revoked sooner.  Performed at [facility], [place] [address], [place], [place] [address]      Radiological Exams on Admission: MR Brain W and Wo  Contrast  Result Date: 09/30/2019 CLINICAL DATA:  24 year old male with left temporal glioblastoma status post resection in September 2020, postoperative radiation using IMRT.  Altered mental status. EXAM: MRI HEAD WITHOUT AND WITH CONTRAST TECHNIQUE: Multiplanar, multiecho pulse sequences of the brain and surrounding structures were obtained without and with intravenous contrast. CONTRAST:  7.6mL GADAVIST GADOBUTROL 1 MMOL/ML IV SOLN COMPARISON:  Brain MRI 08/21/2019 and earlier. FINDINGS: Brain: Severe 1 month progression of confluent and masslike T2 and FLAIR hyperintensity along the genu of the corpus callosum and in both frontal lobes left greater than right (series 11, image 33). Nodular likely hypercellular related restricted diffusion in both the corpus callosum and along the body of the right lateral ventricle has progressed. Similar progression of nodular restricted diffusion along the left splenium of the corpus callosum and left periatrial white matter, along with similar marked increased left parietal lobe masslike T2 and FLAIR hyperintensity. Following contrast today there is extensive progression of irregular periventricular enhancement along the anterior and posterior corpus callosum, and involving most of the lateral periventricular soft tissue bilaterally (series 16, image 92). Associated new nodular enhancement of the fornix. No ependymal enhancement identified along the lower 3rd ventricle, the aqueduct, or 4th ventricle. However, there is abnormal leptomeningeal enhancement now along the ventral brainstem and involving the bilateral 7th/8th cranial nerves at the IACs (series 16, images 38-45). Mild new mass effect on both lateral ventricles. Mildly effaced suprasellar cistern. Other basilar cisterns remain patent. There is trace leftward midline shift at the septum pellucidum. Superimposed post resection encephalomalacia in the left temporal lobe. Brainstem Wallerian degeneration again  suspected. No restricted diffusion suggestive of acute infarction. No ventriculomegaly, new extra-axial collection or acute intracranial hemorrhage. Cervicomedullary junction and pituitary are within normal limits. Vascular: Major intracranial vascular flow voids are stable. Skull and upper cervical spine: Previous left craniotomy, otherwise negative. Sinuses/Orbits: Rightward gaze deviation today. Paranasal sinuses and mastoids are stable and well pneumatized. Other: Abnormal enhancement in both IACs. Stable scalp soft tissues. IMPRESSION: 1. Severe progression of tumor since 08/21/2019. Increased multifocal periventricular, pericallosal, and also brainstem leptomeningeal burden of disease. Increased edema versus nonenhancing tumor in both anterior frontal lobes and the left parietal lobe. 2. Mild new intracranial mass effect, with no midline shift or loss of basilar cistern patency. Electronically Signed   By: Genevie Ann M.D.   On: 09/30/2019 19:07     Assessment/Plan Principal Problem:   Nausea & vomiting Active Problems:   Glioblastoma multiforme of temporal lobe (HCC)    1. Nausea vomiting with weakness with severe progression of patient's known glioblastoma -discussed with Dr. [name] patient's oncologist.  Dr. [name] advised to keep patient on IV fluids Decadron antiemetics and he will be seeing patient in consult for further recommendation. 2. Patient was recently placed on Keppra  for focal seizures which we will dose it is IV for now until patient can reliably take p.o.   DVT prophylaxis: SCDs.  Dr. [name] advised to avoid anticoagulants given history of intracranial bleed. Code Status: Full code. Family Communication: We will need to discuss with family. Disposition Plan: To be determined. Consults called: Oncologist. Admission status: Observation.   Rise Patience MD [facility] Pager [phone].  If 7PM-7AM, please contact night-coverage [email or web address] Password  [facility]  09/30/2019, 8:44 PM

## 2019-09-30 NOTE — Telephone Encounter (Signed)
PROGRESS NOTE: Per Dr [name] sent schedule message to get patient added for phone visit tomorrow with him. Schedule Message sent.

## 2019-09-30 NOTE — Telephone Encounter (Signed)
Patients mother called and left a voicemail stating that the patient was throwing up, getting weaker, walking slow, balance was off, and thinks that he may be dehydrated. Wanted to know if he could receive fluids here at the cancer center or should she take patient to ED. Checked with infusion and per Tora Kindred pt can receive fluids here @2 . Attempted to call patients mother back 5 times and left 5 voicemail's.unable to reach her by phone. Also called home phone number and it goes straight to voicemail.  Waiting for returned phone call to let her know time that patient can come in to get fluids. Sent schedule message to get appointment scheduled as well.

## 2019-10-01 ENCOUNTER — Inpatient Hospital Stay: Payer: 59 | Admitting: Internal Medicine

## 2019-10-01 DIAGNOSIS — C7949 Secondary malignant neoplasm of other parts of nervous system: Secondary | ICD-10-CM

## 2019-10-01 DIAGNOSIS — C712 Malignant neoplasm of temporal lobe: Secondary | ICD-10-CM | POA: Diagnosis present

## 2019-10-01 DIAGNOSIS — R112 Nausea with vomiting, unspecified: Secondary | ICD-10-CM | POA: Diagnosis not present

## 2019-10-01 DIAGNOSIS — R4182 Altered mental status, unspecified: Secondary | ICD-10-CM | POA: Diagnosis present

## 2019-10-01 DIAGNOSIS — C719 Malignant neoplasm of brain, unspecified: Secondary | ICD-10-CM | POA: Diagnosis present

## 2019-10-01 DIAGNOSIS — R627 Adult failure to thrive: Secondary | ICD-10-CM | POA: Diagnosis present

## 2019-10-01 DIAGNOSIS — Z20822 Contact with and (suspected) exposure to covid-19: Secondary | ICD-10-CM | POA: Diagnosis present

## 2019-10-01 DIAGNOSIS — E860 Dehydration: Secondary | ICD-10-CM

## 2019-10-01 DIAGNOSIS — T380X5A Adverse effect of glucocorticoids and synthetic analogues, initial encounter: Secondary | ICD-10-CM | POA: Diagnosis not present

## 2019-10-01 DIAGNOSIS — R569 Unspecified convulsions: Secondary | ICD-10-CM

## 2019-10-01 DIAGNOSIS — D72829 Elevated white blood cell count, unspecified: Secondary | ICD-10-CM | POA: Diagnosis not present

## 2019-10-01 LAB — CBC
HCT: 48.9 % (ref 39.0–52.0)
Hemoglobin: 16 g/dL (ref 13.0–17.0)
MCH: 29.6 pg (ref 26.0–34.0)
MCHC: 32.7 g/dL (ref 30.0–36.0)
MCV: 90.4 fL (ref 80.0–100.0)
Platelets: 192 10*3/uL (ref 150–400)
RBC: 5.41 MIL/uL (ref 4.22–5.81)
RDW: 14.9 % (ref 11.5–15.5)
WBC: 6.3 10*3/uL (ref 4.0–10.5)
nRBC: 0 % (ref 0.0–0.2)

## 2019-10-01 LAB — BASIC METABOLIC PANEL
Anion gap: 9 (ref 5–15)
BUN: 11 mg/dL (ref 6–20)
CO2: 27 mmol/L (ref 22–32)
Calcium: 9.7 mg/dL (ref 8.9–10.3)
Chloride: 103 mmol/L (ref 98–111)
Creatinine, Ser: 0.73 mg/dL (ref 0.61–1.24)
GFR calc Af Amer: >60 mL/min (ref >60)
GFR calc non Af Amer: >60 mL/min (ref >60)
Glucose, Bld: 107 mg/dL — ABNORMAL HIGH (ref 70–99)
Potassium: 4.7 mmol/L (ref 3.5–5.1)
Sodium: 139 mmol/L (ref 135–145)

## 2019-10-01 LAB — HIV ANTIBODY (ROUTINE TESTING W REFLEX): HIV Screen 4th Generation wRfx: NONREACTIVE

## 2019-10-01 MED ORDER — OLANZAPINE 5 MG PO TABS
5.0000 mg | ORAL_TABLET | Freq: Every day | ORAL | Status: DC
Start: 2019-10-01 — End: 2019-10-01

## 2019-10-01 MED ORDER — ESCITALOPRAM OXALATE 10 MG PO TABS
5.0000 mg | ORAL_TABLET | Freq: Every day | ORAL | Status: DC
  Administered 2019-10-01 – 2019-10-02 (×2): 5 mg via ORAL
  Filled 2019-10-01 (×2): qty 1

## 2019-10-01 MED ORDER — DEXTROSE-NACL 5-0.9 % IV SOLN: INTRAVENOUS | Status: DC

## 2019-10-01 MED ORDER — SODIUM CHLORIDE 0.9 % IV SOLN
250.0000 mg | Freq: Two times a day (BID) | INTRAVENOUS | Status: DC
  Administered 2019-10-01 – 2019-10-03 (×5): 250 mg via INTRAVENOUS
  Filled 2019-10-01 (×7): qty 2.5

## 2019-10-01 MED ORDER — OLANZAPINE 5 MG PO TABS: 5.0000 mg | ORAL_TABLET | Freq: Every day | ORAL | Status: DC | PRN

## 2019-10-01 NOTE — Consult Note (Signed)
[facility] Consult Note  Patient Care Team: [facility], [facility] as PCP - General (Family Medicine)  CHIEF COMPLAINTS/PURPOSE OF CONSULTATION:  Glioblastoma Refractory Nausea/vomiting  HISTORY OF PRESENTING ILLNESS:  [name] 24 y.o. male presented with several days history of acute on subacute nausea and vomiting, dehydration, poor food and fluid intake.  Symptoms had been refractory to Zofran, Reglan, and Zyprexa.  Weakness and poor energy have been noted in recent days, including lethargy and sleepiness at times.  In ED he underwent contrast enhanced brain MRI which we are present today to discuss.    MEDICAL HISTORY:  History reviewed. No pertinent past medical history.  SURGICAL HISTORY: Past Surgical History:  Procedure Laterality Date  . CRANIOTOMY Left 10/13/2018   Procedure: LEFT CRANIOTOMY FOR TUMOR RESECTION;  Surgeon: Judith Part, MD;  Location: [facility];  Service: Neurosurgery;  Laterality: Left;    SOCIAL HISTORY: Social History   Socioeconomic History  . Marital status: Single    Spouse name: Not on file  . Number of children: Not on file  . Years of education: Not on file  . Highest education level: Not on file  Occupational History  . Not on file  Tobacco Use  . Smoking status: Never Smoker  . Smokeless tobacco: Never Used  Substance and Sexual Activity  . Alcohol use: Not on file  . Drug use: Not on file  . Sexual activity: Not on file  Other Topics Concern  . Not on file  Social History Narrative  . Not on file   Social Determinants of Health   Financial Resource Strain:   . Difficulty of Paying Living Expenses: Not on file  Food Insecurity:   . Worried About Charity fundraiser in the Last Year: Not on file  . Ran Out of Food in the Last Year: Not on file  Transportation Needs:   . Lack of Transportation (Medical): Not on file  . Lack of Transportation (Non-Medical): Not on file  Physical  Activity:   . Days of Exercise per Week: Not on file  . Minutes of Exercise per Session: Not on file  Stress:   . Feeling of Stress : Not on file  Social Connections:   . Frequency of Communication with Friends and Family: Not on file  . Frequency of Social Gatherings with Friends and Family: Not on file  . Attends Religious Services: Not on file  . Active Member of Clubs or Organizations: Not on file  . Attends Archivist Meetings: Not on file  . Marital Status: Not on file  Intimate Partner Violence:   . Fear of Current or Ex-Partner: Not on file  . Emotionally Abused: Not on file  . Physically Abused: Not on file  . Sexually Abused: Not on file    FAMILY HISTORY: History reviewed. No pertinent family history.  ALLERGIES:  has No Known Allergies.  MEDICATIONS:  Current Facility-Administered Medications  Medication Dose Route Frequency Provider Last Rate Last Admin  . acetaminophen (TYLENOL) tablet 650 mg  650 mg Oral Q6H PRN Rise Patience, MD       Or  . acetaminophen (TYLENOL) suppository 650 mg  650 mg Rectal Q6H PRN Rise Patience, MD      . dexamethasone (DECADRON) injection 4 mg  4 mg Intravenous Q6H Rise Patience, MD   4 mg at 10/01/19 1223  . dextrose 5 %-0.9 % sodium chloride infusion   Intravenous Continuous [place] Silos,  Malachy Moan, MD 75 mL/hr at 10/01/19 0951 New Bag at 10/01/19 0951  . escitalopram (LEXAPRO) tablet 5 mg  5 mg Oral QHS Eugenie Filler, MD      . feeding supplement (ENSURE ENLIVE) (ENSURE ENLIVE) liquid 237 mL  237 mL Oral BID BM Rise Patience, MD   237 mL at 10/01/19 0938  . levETIRAcetam (KEPPRA) 250 mg in sodium chloride 0.9 % 100 mL IVPB  250 mg Intravenous Q12H Rise Patience, MD 410 mL/hr at 10/01/19 1225 250 mg at 10/01/19 1225  . ondansetron (ZOFRAN) tablet 4 mg  4 mg Oral Q6H PRN Rise Patience, MD       Or  . ondansetron [facility]) injection 4 mg  4 mg Intravenous Q6H PRN Rise Patience, MD         REVIEW OF SYSTEMS:   Limited by dysphasia  PHYSICAL EXAMINATION: Vitals:   10/01/19 1110 10/01/19 1432  BP: 138/85 128/87  Pulse: 90 84  Resp: 20 (!) 22  Temp: 97.8 F (36.6 C) 98.6 F (37 C)  SpO2: 100% 98%   KPS: 60. General: Alert, cooperative, pleasant, in no acute distress Head: Craniotomy scar noted, dry and intact. EENT: No conjunctival injection or scleral icterus. Oral mucosa moist Lungs: Resp effort normal Cardiac: Regular rate and rhythm Abdomen: Soft, non-distended abdomen Skin: No rashes cyanosis or petechiae. Extremities: No clubbing or edema  NEUROLOGIC EXAM: Mental Status: Awake, alert, attentive to examiner. Oriented to self and environment. Language demonstrates modest impairments with regards to fluency. Cranial Nerves: Visual acuity is grossly normal. Dense R homonymous hemianopia. Extra-ocular movements intact. No ptosis. Face is symmetric, tongue midline. Motor: Tone and bulk are normal. Power is 3/5 in right arm and leg, 5/5 on left side. Reflexes are symmetric, no pathologic reflexes present. Sensory: Intact to light touch and temperature Gait: deferred LABORATORY DATA:  I have reviewed the data as listed Lab Results  Component Value Date   WBC 6.3 10/01/2019   HGB 16.0 10/01/2019   HCT 48.9 10/01/2019   MCV 90.4 10/01/2019   PLT 192 10/01/2019   Recent Labs    07/28/19 1023 07/28/19 1023 08/24/19 0912 09/30/19 1617 10/01/19 0606  NA 139   < > 136 141 139  K 3.9   < > 4.2 4.2 4.7  CL 103   < > 103 100 103  CO2 27   < > 26 29 27   GLUCOSE 101*   < > 87 96 107*  BUN 13   < > 13 17 11   CREATININE 0.89   < > 0.88 0.84 0.73  CALCIUM 10.0   < > 9.6 9.7 9.7  GFRNONAA >60   < > >60 >60 >60  GFRAA >60   < > >60 >60 >60  PROT 8.1  --  7.6 8.1  --   ALBUMIN 3.8  --  3.5 4.1  --   AST 15  --  11* 18  --   ALT 16  --  15 25  --   ALKPHOS 82  --  98 73  --   BILITOT 0.5  --  0.4 0.7  --    < > = values in this interval not displayed.     RADIOGRAPHIC STUDIES: I have personally reviewed the radiological images as listed and agreed with the findings in the report. MR Brain W and Wo Contrast  Result Date: 09/30/2019 CLINICAL DATA:  24 year old male with left temporal glioblastoma status post resection in  September 2020, postoperative radiation using IMRT.  Altered mental status. EXAM: MRI HEAD WITHOUT AND WITH CONTRAST TECHNIQUE: Multiplanar, multiecho pulse sequences of the brain and surrounding structures were obtained without and with intravenous contrast. CONTRAST:  7.49mL GADAVIST GADOBUTROL 1 MMOL/ML IV SOLN COMPARISON:  Brain MRI 08/21/2019 and earlier. FINDINGS: Brain: Severe 1 month progression of confluent and masslike T2 and FLAIR hyperintensity along the genu of the corpus callosum and in both frontal lobes left greater than right (series 11, image 33). Nodular likely hypercellular related restricted diffusion in both the corpus callosum and along the body of the right lateral ventricle has progressed. Similar progression of nodular restricted diffusion along the left splenium of the corpus callosum and left periatrial white matter, along with similar marked increased left parietal lobe masslike T2 and FLAIR hyperintensity. Following contrast today there is extensive progression of irregular periventricular enhancement along the anterior and posterior corpus callosum, and involving most of the lateral periventricular soft tissue bilaterally (series 16, image 92). Associated new nodular enhancement of the fornix. No ependymal enhancement identified along the lower 3rd ventricle, the aqueduct, or 4th ventricle. However, there is abnormal leptomeningeal enhancement now along the ventral brainstem and involving the bilateral 7th/8th cranial nerves at the IACs (series 16, images 38-45). Mild new mass effect on both lateral ventricles. Mildly effaced suprasellar cistern. Other basilar cisterns remain patent. There is trace leftward  midline shift at the septum pellucidum. Superimposed post resection encephalomalacia in the left temporal lobe. Brainstem Wallerian degeneration again suspected. No restricted diffusion suggestive of acute infarction. No ventriculomegaly, new extra-axial collection or acute intracranial hemorrhage. Cervicomedullary junction and pituitary are within normal limits. Vascular: Major intracranial vascular flow voids are stable. Skull and upper cervical spine: Previous left craniotomy, otherwise negative. Sinuses/Orbits: Rightward gaze deviation today. Paranasal sinuses and mastoids are stable and well pneumatized. Other: Abnormal enhancement in both IACs. Stable scalp soft tissues. IMPRESSION: 1. Severe progression of tumor since 08/21/2019. Increased multifocal periventricular, pericallosal, and also brainstem leptomeningeal burden of disease. Increased edema versus nonenhancing tumor in both anterior frontal lobes and the left parietal lobe. 2. Mild new intracranial mass effect, with no midline shift or loss of basilar cistern patency. Electronically Signed   By: Genevie Ann M.D.   On: 09/30/2019 19:07    ASSESSMENT & PLAN:  Progressive Glioblastoma  [name] presents with clinical and radiographic syndrome consistent with aggressive parenchymal and leptomeningeal dissemination of glioblastoma.  The depth and degree of progression has been rapid over just the past one month.  His symptomology at this time, refractory nausea and vomiting, is secondary to focal irritation of the chemoreceptor trigger zone in the dorsal medulla and bilateral enhancement along vestibular nerve tracts.  We unfortunately have limited options for intervention at this time.  Aggressive cytotoxic chemotherapy regimens have not demonstrated survival benefit in this scenario, and though craniospinal irradiation could buy him some time, the toxicity is such that quality of life could degrade considerably.  Patient and family are  still processing this difficult news.  Understandably, they would like to reach out to other providers for second, third, fourth opinions if possible.  We will communicate recent developments with Dr. Vedia Coffer, neuro-oncologist at [facility] who had evaluated [place] for possible clinical trial last month.  In addition, we will reach out to [facility] for possible consult or evaluation.    In the meantime recommend the following: -Continue decadron 4mg  QID if tolerated for symptoms -Palliative care evaluation, at  this time mainly for symptom management but also to (potentially) gently further our goals of care discussion    -Disposition TBD based on clinical status, patient and family wishes  Tomorrow (10/02/19) I will be taking [facility] board exam, so will be unavailable until later in the afternoon.  Otherwise I will be available for call/page throughout the weekend for updates.  All questions were answered. The patient knows to call the clinic with any problems, questions or concerns.  The total time spent in the encounter was 55 minutes and more than 50% was on counseling and review of test results     Ventura Sellers, MD 10/01/2019 3:21 PM

## 2019-10-01 NOTE — Progress Notes (Signed)
PROGRESS NOTE    Gregory Moreno  EUM:[id number] DOB: [birth year] DOA: 09/30/2019 PCP: [facility]    Chief Complaint  Patient presents with  . Emesis  . Altered Mental Status    Brief Narrative: HPI per Dr. Marice Potter III Gregory Moreno is a 24 y.o. 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 [name] neuro oncologist had just recently followed up on September 24, 2019 at the time was started on Keppra for possible focal seizures and planned to have a repeat MRI within 2 weeks started getting very weak and nausea vomiting with profound weakness was brought to the ER.  ED Course: In the ER patient had MRI of the brain which showed significant progression of his glioblastoma.  Labs were unremarkable Covid test was negative.  Dr. [name] was consulted.  Patient admitted for further observation.   Assessment & Plan:   Principal Problem:   Nausea & vomiting Active Problems:   Glioblastoma multiforme of temporal lobe (HCC)   1  Nausea vomiting weakness secondary to severe progression of known glioblastoma Patient presenting with nausea vomiting weakness.  MRI brain done with significant progression of his glioblastoma.  Admitting physician spoke with Dr. [name] neuro oncologist who recommended IV Decadron, antiemetics, hydration with IV fluids, continuation of Keppra.  Patient with clinical improvement with no further emesis and able to tolerate his breakfast this morning.  Dr. [name], neuro-oncology to assess patient today and discussed with family and patient on progression of glioblastoma.  PT/OT.  Supportive care.  2.  Probable Focal seizures Patient recently placed on Keppra for concerns for focal seizures.  Continue IV Keppra and if continued improvement with nausea and vomiting could transition back to home dose oral Keppra.   DVT prophylaxis: SCDs, Dr. [name] advised to avoid anticoagulants  given history of intracranial bleed. Code Status: Full Family Communication: Updated patient and father at bedside. Disposition:   Status is: Inpatient    Dispo: The patient is from: Home              Anticipated d/c is to: Likely home              Anticipated d/c date is: To be determined              Patient currently unstable for discharge.  Patient presented with nausea vomiting with progression of glioblastoma on MRI brain.  Neuro oncology following.       Consultants:  Neuro oncology: Dr. [name] pending  Procedures:   MRI brain 09/30/2019    Antimicrobials:   None   Subjective: Patient sitting up in bed.  Denies any emesis.  Able to keep his breakfast down.  States some improvement with his strength.  Feeling better.  Father at bedside.  Objective: Vitals:   09/30/19 2217 09/30/19 2258 10/01/19 0634 10/01/19 1110  BP: 112/73 (!) 143/82 117/81 138/85  Pulse: 61 (!) 58 74 90  Resp: 12 16 19 20   Temp: (!) 97.5 F (36.4 C) 98.3 F (36.8 C) 97.7 F (36.5 C) 97.8 F (36.6 C)  TempSrc: Oral Oral Oral Oral  SpO2: 100% 99% 91% 100%  Weight:      Height:        Intake/Output Summary (Last 24 hours) at 10/01/2019 1211 Last data filed at 10/01/2019 1000 Gross per 24 hour  Intake 1365.58 ml  Output --  Net 1365.58 ml   Filed Weights   09/30/19 1600  Weight: 79.4 kg    Examination:  General exam: Appears calm and comfortable  Respiratory system: Clear to auscultation. Respiratory effort normal. Cardiovascular system: S1 & S2 heard, RRR. No JVD, murmurs, rubs, gallops or clicks. No pedal edema. Gastrointestinal system: Abdomen is nondistended, soft and nontender. No organomegaly or masses felt. Normal bowel sounds heard. Central nervous system: Alert and oriented.  Chronic right upper extremity paresis.  Extremities: 4/5 right lower extremity strength.  5/5 left upper and left lower extremity strength.  0-1/5 right upper extremity strength.  Skin: No  rashes, lesions or ulcers Psychiatry: Judgement and insight appear normal. Mood & affect appropriate.     Data Reviewed: I have personally reviewed following labs and imaging studies  CBC: Recent Labs  Lab 09/30/19 1617 10/01/19 0606  WBC 8.4 6.3  NEUTROABS 6.9  --   HGB 15.4 16.0  HCT 47.4 48.9  MCV 92.4 90.4  PLT 209 638    Basic Metabolic Panel: Recent Labs  Lab 09/30/19 1617 10/01/19 0606  NA 141 139  K 4.2 4.7  CL 100 103  CO2 29 27  GLUCOSE 96 107*  BUN 17 11  CREATININE 0.84 0.73  CALCIUM 9.7 9.7    GFR: Estimated Creatinine Clearance: 143.6 mL/min (by C-G formula based on SCr of 0.73 mg/dL).  Liver Function Tests: Recent Labs  Lab 09/30/19 1617  AST 18  ALT 25  ALKPHOS 73  BILITOT 0.7  PROT 8.1  ALBUMIN 4.1    CBG: No results for input(s): GLUCAP in the last 168 hours.   Recent Results (from the past 240 hour(s))  SARS Coronavirus 2 by RT PCR (hospital order, performed in [facility] hospital lab) Nasopharyngeal Nasopharyngeal Swab     Status: None   Collection Time: 09/30/19  4:19 PM   Specimen: Nasopharyngeal Swab  Result Value Ref Range Status   SARS Coronavirus 2 NEGATIVE NEGATIVE Final    Comment: (NOTE) SARS-CoV-2 target nucleic acids are NOT DETECTED.  The SARS-CoV-2 RNA is generally detectable in upper and lower respiratory specimens during the acute phase of infection. The lowest concentration of SARS-CoV-2 viral copies this assay can detect is 250 copies / mL. A negative result does not preclude SARS-CoV-2 infection and should not be used as the sole basis for treatment or other patient management decisions.  A negative result may occur with improper specimen collection / handling, submission of specimen other than nasopharyngeal swab, presence of viral mutation(s) within the areas targeted by this assay, and inadequate number of viral copies (<250 copies / mL). A negative result must be combined with clinical observations,  patient history, and epidemiological information.  Fact Sheet for Patients:   StrictlyIdeas.no  Fact Sheet for Healthcare Providers: BankingDealers.co.za  This test is not yet approved or  cleared by the [place] FDA and has been authorized for detection and/or diagnosis of SARS-CoV-2 by FDA under an Emergency Use Authorization (EUA).  This EUA will remain in effect (meaning this test can be used) for the duration of the COVID-19 declaration under Section 564(b)(1) of the Act, 21 U.S.C. section 360bbb-3(b)(1), unless the authorization is terminated or revoked sooner.  Performed at [facility], [place] [address] [place], [place] [address]          Radiology Studies: MR Brain W and Wo Contrast  Result Date: 09/30/2019 CLINICAL DATA:  24 year old male with left temporal glioblastoma status post resection in September 2020, postoperative radiation using IMRT.  Altered mental status. EXAM: MRI HEAD WITHOUT  AND WITH CONTRAST TECHNIQUE: Multiplanar, multiecho pulse sequences of the brain and surrounding structures were obtained without and with intravenous contrast. CONTRAST:  7.30mL GADAVIST GADOBUTROL 1 MMOL/ML IV SOLN COMPARISON:  Brain MRI 08/21/2019 and earlier. FINDINGS: Brain: Severe 1 month progression of confluent and masslike T2 and FLAIR hyperintensity along the genu of the corpus callosum and in both frontal lobes left greater than right (series 11, image 33). Nodular likely hypercellular related restricted diffusion in both the corpus callosum and along the body of the right lateral ventricle has progressed. Similar progression of nodular restricted diffusion along the left splenium of the corpus callosum and left periatrial white matter, along with similar marked increased left parietal lobe masslike T2 and FLAIR hyperintensity. Following contrast today there is extensive progression of irregular periventricular  enhancement along the anterior and posterior corpus callosum, and involving most of the lateral periventricular soft tissue bilaterally (series 16, image 92). Associated new nodular enhancement of the fornix. No ependymal enhancement identified along the lower 3rd ventricle, the aqueduct, or 4th ventricle. However, there is abnormal leptomeningeal enhancement now along the ventral brainstem and involving the bilateral 7th/8th cranial nerves at the IACs (series 16, images 38-45). Mild new mass effect on both lateral ventricles. Mildly effaced suprasellar cistern. Other basilar cisterns remain patent. There is trace leftward midline shift at the septum pellucidum. Superimposed post resection encephalomalacia in the left temporal lobe. Brainstem Wallerian degeneration again suspected. No restricted diffusion suggestive of acute infarction. No ventriculomegaly, new extra-axial collection or acute intracranial hemorrhage. Cervicomedullary junction and pituitary are within normal limits. Vascular: Major intracranial vascular flow voids are stable. Skull and upper cervical spine: Previous left craniotomy, otherwise negative. Sinuses/Orbits: Rightward gaze deviation today. Paranasal sinuses and mastoids are stable and well pneumatized. Other: Abnormal enhancement in both IACs. Stable scalp soft tissues. IMPRESSION: 1. Severe progression of tumor since 08/21/2019. Increased multifocal periventricular, pericallosal, and also brainstem leptomeningeal burden of disease. Increased edema versus nonenhancing tumor in both anterior frontal lobes and the left parietal lobe. 2. Mild new intracranial mass effect, with no midline shift or loss of basilar cistern patency. Electronically Signed   By: Genevie Ann M.D.   On: 09/30/2019 19:07        Scheduled Meds: . dexamethasone (DECADRON) injection  4 mg Intravenous Q6H  . escitalopram  5 mg Oral QHS  . feeding supplement (ENSURE ENLIVE)  237 mL Oral BID BM   Continuous  Infusions: . dextrose 5 % and 0.9% NaCl 75 mL/hr at 10/01/19 0951  . levETIRAcetam 250 mg (10/01/19 0115)     LOS: 0 days    Time spent: 35 minutes    Irine Seal, MD [facility]   To contact the attending provider between 7A-7P or the covering provider during after hours 7P-7A, please log into the web site [email or web address] and access using universal [place] password for that web site. If you do not have the password, please call the hospital operator.  10/01/2019, 12:11 PM

## 2019-10-02 ENCOUNTER — Encounter: Payer: 59 | Admitting: Physical Medicine and Rehabilitation

## 2019-10-02 LAB — BASIC METABOLIC PANEL
Anion gap: 10 (ref 5–15)
BUN: 19 mg/dL (ref 6–20)
CO2: 26 mmol/L (ref 22–32)
Calcium: 9.1 mg/dL (ref 8.9–10.3)
Chloride: 105 mmol/L (ref 98–111)
Creatinine, Ser: 0.76 mg/dL (ref 0.61–1.24)
GFR calc Af Amer: >60 mL/min (ref >60)
GFR calc non Af Amer: >60 mL/min (ref >60)
Glucose, Bld: 116 mg/dL — ABNORMAL HIGH (ref 70–99)
Potassium: 3.6 mmol/L (ref 3.5–5.1)
Sodium: 141 mmol/L (ref 135–145)

## 2019-10-02 LAB — CBC
HCT: 45.8 % (ref 39.0–52.0)
Hemoglobin: 14.8 g/dL (ref 13.0–17.0)
MCH: 30 pg (ref 26.0–34.0)
MCHC: 32.3 g/dL (ref 30.0–36.0)
MCV: 92.9 fL (ref 80.0–100.0)
Platelets: 219 10*3/uL (ref 150–400)
RBC: 4.93 MIL/uL (ref 4.22–5.81)
RDW: 14.9 % (ref 11.5–15.5)
WBC: 13.2 10*3/uL — ABNORMAL HIGH (ref 4.0–10.5)
nRBC: 0 % (ref 0.0–0.2)

## 2019-10-02 LAB — MAGNESIUM: Magnesium: 2.1 mg/dL (ref 1.7–2.4)

## 2019-10-02 NOTE — Progress Notes (Signed)
PROGRESS NOTE    Gregory Moreno  NUU:[id number] DOB: 03/21/1995 DOA: 09/30/2019 PCP: [facility]    Chief Complaint  Patient presents with  . Emesis  . Altered Mental Status    Brief Narrative: HPI per Dr. Marice Potter III Gregory Moreno is a 24 y.o. 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 [name] neuro oncologist had just recently followed up on September 24, 2019 at the time was started on Keppra for possible focal seizures and planned to have a repeat MRI within 2 weeks started getting very weak and nausea vomiting with profound weakness was brought to the ER.  ED Course: In the ER patient had MRI of the brain which showed significant progression of his glioblastoma.  Labs were unremarkable Covid test was negative.  Dr. [name] was consulted.  Patient admitted for further observation.   Assessment & Plan:   Principal Problem:   Nausea & vomiting Active Problems:   Glioblastoma multiforme of temporal lobe (HCC)   Failure to thrive in adult   Glioblastoma (HCC)   Focal seizure (HCC)   1  Nausea vomiting weakness secondary to severe progression of known glioblastoma Patient presenting with nausea vomiting weakness.  MRI brain done with significant progression of his glioblastoma.  Admitting physician spoke with Dr. [name] neuro oncologist who recommended IV Decadron, antiemetics, hydration with IV fluids, continuation of Keppra.  Patient with clinical improvement with no further emesis and able to tolerate his diet.  Dr. [name] neuro-oncology has assessed patient and discussed with patient on patient's progression of glioblastoma.  We will saline lock IV fluids.  Continue IV Decadron.  PT/OT.  Palliative care following.  Supportive care.  2.  Probable Focal seizures Patient recently placed on Keppra for concerns for focal seizures.  Continue IV Keppra and if continued improvement with  nausea and vomiting could transition back to home dose oral Keppra tomorrow or on discharge.  3.  Leukocytosis Secondary to steroids.   DVT prophylaxis: SCDs, Dr. [name] advised to avoid anticoagulants given history of intracranial bleed. Code Status: Full Family Communication: Updated patient and mother at bedside. Disposition:   Status is: Inpatient    Dispo: The patient is from: Home              Anticipated d/c is to: Home              Anticipated d/c date is: 1 to 2 days.              Patient currently unstable for discharge.  Patient presented with nausea vomiting with progression of glioblastoma on MRI brain.  Neuro oncology following.       Consultants:  Neuro oncology: Dr. [name] 10/01/2019 Palliative care: Dr. Hilma Favors 10/01/2019  Procedures:   MRI brain 09/30/2019    Antimicrobials:   None   Subjective: Patient sitting up in chair.  No further nausea or emesis.  No chest pain.  No shortness of breath.  Mother at bedside stated patient was able to ambulate in the hallway with the aid of a walker with physical therapy earlier on today.  Patient noted to be tolerating diet however cannot quite remember whether he ate breakfast.  Awaiting his lunch.   Objective: Vitals:   10/01/19 0634 10/01/19 1110 10/01/19 1432 10/01/19 2142  BP: 117/81 138/85 128/87 138/86  Pulse: 74 90 84 (!) 107  Resp: 19 20 (!) 22 15  Temp: 97.7 F (36.5 C)  97.8 F (36.6 C) 98.6 F (37 C) 98.1 F (36.7 C)  TempSrc: Oral Oral Oral Oral  SpO2: 91% 100% 98% 97%  Weight:      Height:        Intake/Output Summary (Last 24 hours) at 10/02/2019 1234 Last data filed at 10/02/2019 1000 Gross per 24 hour  Intake 1939.75 ml  Output 1775 ml  Net 164.75 ml   Filed Weights   09/30/19 1600  Weight: 79.4 kg    Examination:  General exam: NAD Respiratory system: CTAB.  No wheezes, no crackles, no rhonchi.  Normal respiratory effort.  Cardiovascular system: Regular rate rhythm no  murmurs rubs or gallops.  No JVD.  No lower extremity edema. Gastrointestinal system: Abdomen is nondistended, soft, nontender, positive bowel sounds.  No rebound.  No guarding.  Central nervous system: Alert and oriented.  Chronic right upper extremity paresis.  Extremities: 4/5 right lower extremity strength.  5/5 left upper and left lower extremity strength.  0-1/5 right upper extremity strength.  Skin: No rashes, lesions or ulcers Psychiatry: Judgement and insight appear normal. Mood & affect appropriate.     Data Reviewed: I have personally reviewed following labs and imaging studies  CBC: Recent Labs  Lab 09/30/19 1617 10/01/19 0606 10/02/19 0541  WBC 8.4 6.3 13.2*  NEUTROABS 6.9  --   --   HGB 15.4 16.0 14.8  HCT 47.4 48.9 45.8  MCV 92.4 90.4 92.9  PLT 209 192 096    Basic Metabolic Panel: Recent Labs  Lab 09/30/19 1617 10/01/19 0606 10/02/19 0541  NA 141 139 141  K 4.2 4.7 3.6  CL 100 103 105  CO2 29 27 26   GLUCOSE 96 107* 116*  BUN 17 11 19   CREATININE 0.84 0.73 0.76  CALCIUM 9.7 9.7 9.1  MG  --   --  2.1    GFR: Estimated Creatinine Clearance: 143.6 mL/min (by C-G formula based on SCr of 0.76 mg/dL).  Liver Function Tests: Recent Labs  Lab 09/30/19 1617  AST 18  ALT 25  ALKPHOS 73  BILITOT 0.7  PROT 8.1  ALBUMIN 4.1    CBG: No results for input(s): GLUCAP in the last 168 hours.   Recent Results (from the past 240 hour(s))  SARS Coronavirus 2 by RT PCR (hospital order, performed in [facility] hospital lab) Nasopharyngeal Nasopharyngeal Swab     Status: None   Collection Time: 09/30/19  4:19 PM   Specimen: Nasopharyngeal Swab  Result Value Ref Range Status   SARS Coronavirus 2 NEGATIVE NEGATIVE Final    Comment: (NOTE) SARS-CoV-2 target nucleic acids are NOT DETECTED.  The SARS-CoV-2 RNA is generally detectable in upper and lower respiratory specimens during the acute phase of infection. The lowest concentration of SARS-CoV-2 viral  copies this assay can detect is 250 copies / mL. A negative result does not preclude SARS-CoV-2 infection and should not be used as the sole basis for treatment or other patient management decisions.  A negative result may occur with improper specimen collection / handling, submission of specimen other than nasopharyngeal swab, presence of viral mutation(s) within the areas targeted by this assay, and inadequate number of viral copies (<250 copies / mL). A negative result must be combined with clinical observations, patient history, and epidemiological information.  Fact Sheet for Patients:   StrictlyIdeas.no  Fact Sheet for Healthcare Providers: BankingDealers.co.za  This test is not yet approved or  cleared by the [place] FDA and has been authorized for detection and/or  diagnosis of SARS-CoV-2 by FDA under an Emergency Use Authorization (EUA).  This EUA will remain in effect (meaning this test can be used) for the duration of the COVID-19 declaration under Section 564(b)(1) of the Act, 21 U.S.C. section 360bbb-3(b)(1), unless the authorization is terminated or revoked sooner.  Performed at [facility], [place] [address] [place], [place] [address]          Radiology Studies: MR Brain W and Wo Contrast  Result Date: 09/30/2019 CLINICAL DATA:  24 year old male with left temporal glioblastoma status post resection in September 2020, postoperative radiation using IMRT.  Altered mental status. EXAM: MRI HEAD WITHOUT AND WITH CONTRAST TECHNIQUE: Multiplanar, multiecho pulse sequences of the brain and surrounding structures were obtained without and with intravenous contrast. CONTRAST:  7.44mL GADAVIST GADOBUTROL 1 MMOL/ML IV SOLN COMPARISON:  Brain MRI 08/21/2019 and earlier. FINDINGS: Brain: Severe 1 month progression of confluent and masslike T2 and FLAIR hyperintensity along the genu of the corpus callosum and in  both frontal lobes left greater than right (series 11, image 33). Nodular likely hypercellular related restricted diffusion in both the corpus callosum and along the body of the right lateral ventricle has progressed. Similar progression of nodular restricted diffusion along the left splenium of the corpus callosum and left periatrial white matter, along with similar marked increased left parietal lobe masslike T2 and FLAIR hyperintensity. Following contrast today there is extensive progression of irregular periventricular enhancement along the anterior and posterior corpus callosum, and involving most of the lateral periventricular soft tissue bilaterally (series 16, image 92). Associated new nodular enhancement of the fornix. No ependymal enhancement identified along the lower 3rd ventricle, the aqueduct, or 4th ventricle. However, there is abnormal leptomeningeal enhancement now along the ventral brainstem and involving the bilateral 7th/8th cranial nerves at the IACs (series 16, images 38-45). Mild new mass effect on both lateral ventricles. Mildly effaced suprasellar cistern. Other basilar cisterns remain patent. There is trace leftward midline shift at the septum pellucidum. Superimposed post resection encephalomalacia in the left temporal lobe. Brainstem Wallerian degeneration again suspected. No restricted diffusion suggestive of acute infarction. No ventriculomegaly, new extra-axial collection or acute intracranial hemorrhage. Cervicomedullary junction and pituitary are within normal limits. Vascular: Major intracranial vascular flow voids are stable. Skull and upper cervical spine: Previous left craniotomy, otherwise negative. Sinuses/Orbits: Rightward gaze deviation today. Paranasal sinuses and mastoids are stable and well pneumatized. Other: Abnormal enhancement in both IACs. Stable scalp soft tissues. IMPRESSION: 1. Severe progression of tumor since 08/21/2019. Increased multifocal periventricular,  pericallosal, and also brainstem leptomeningeal burden of disease. Increased edema versus nonenhancing tumor in both anterior frontal lobes and the left parietal lobe. 2. Mild new intracranial mass effect, with no midline shift or loss of basilar cistern patency. Electronically Signed   By: Genevie Ann M.D.   On: 09/30/2019 19:07        Scheduled Meds: . dexamethasone (DECADRON) injection  4 mg Intravenous Q6H  . escitalopram  5 mg Oral QHS  . feeding supplement (ENSURE ENLIVE)  237 mL Oral BID BM   Continuous Infusions: . dextrose 5 % and 0.9% NaCl 75 mL/hr at 10/02/19 0958  . levETIRAcetam 250 mg (10/02/19 1228)     LOS: 1 day    Time spent: 35 minutes    Irine Seal, MD [facility]   To contact the attending provider between 7A-7P or the covering provider during after hours 7P-7A, please log into the web site [email or web address] and access using universal [place]  password for that web site. If you do not have the password, please call the hospital operator.  10/02/2019, 12:34 PM

## 2019-10-02 NOTE — Progress Notes (Signed)
Occupational Therapy Evaluation  Patient with functional deficits listed below impacting safety and independence with self care. Patient's mom reports patient was ambulatory and had progressed to no AD until mid August with very recent transition to wheelchair level due to decreased strength, balance. Currently patient requiring mod A for bed mobility to elevate trunk, min A x2 with cues for body mechanics and weight shifting to stand EOB. Patient require assist to raise R UE to walker and hand over hand to maintain grip. Min A x2 to ambulate in hallway with decreased motor control R LE and cues for posture due to posterior bias. Recommend continued acute OT services in order to facilitate D/C to venue listed below.    10/02/19 1300  OT Visit Information  Last OT Received On 10/02/19  Assistance Needed +2  PT/OT/SLP Co-Evaluation/Treatment Yes  Reason for Co-Treatment Complexity of the patient's impairments (multi-system involvement);Necessary to address cognition/behavior during functional activity;For patient/therapist safety;To address functional/ADL transfers  PT goals addressed during session Mobility/safety with mobility  OT goals addressed during session ADL's and self-care  History of Present Illness Pt admitted from [place] with AMS and N/V.  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  Pt now with spread of CA noted on imaging.  Precautions  Precautions Fall  Required Braces or Orthoses Other Brace (R AFO)  Restrictions  Weight Bearing Restrictions No  Home Living  Family/patient expects to be discharged to: Private residence  Living Arrangements Parent  Available Help at Discharge Family;Available 24 hours/day  Type of [place] entrance  Entrance Stairs-Rails None  Home Layout Multi-level;Able to live on main level with bedroom/bathroom  Bathroom Biomedical scientist Yes  How  Accessible Accessible via walker  [place] - single point;BSC;Shower seat;Walker - 2 wheels;Wheelchair - Education administrator (comment) (lift chair)  Prior Function  Level of Independence Needs assistance  Gait / Transfers Assistance Needed Pt ambulating sans AD in August but progressed to use of cane and most recently largely using WC  ADL's / [place] of parents  Communication  Communication Expressive difficulties  Pain Assessment  Pain Assessment No/denies pain  Cognition  Arousal/Alertness Awake/alert  Behavior During Therapy Flat affect  Overall Cognitive Status Impaired/Different from baseline  Area of Impairment Following commands;Problem solving  Following Commands Follows one step commands with increased time  Problem Solving Slow processing;Difficulty sequencing;Requires verbal cues;Requires tactile cues  Upper Extremity Assessment  Upper Extremity Assessment RUE deficits/detail  RUE Deficits / Details flexor tone noted in R hand, mom reports has splints at home, trace wrist extension, grossly intact elbow flexion, unable to actively move shoulder pivots against gravity  RUE Coordination decreased fine motor;decreased gross motor  Lower Extremity Assessment  Lower Extremity Assessment Defer to PT evaluation  Cervical / Trunk Assessment  Cervical / Trunk Assessment Other exceptions  Cervical / Trunk Exceptions poor core stability  ADL  Overall ADL's  Needs assistance/impaired  Grooming Minimal assistance;Bed level  Upper Body Bathing Minimal assistance;Bed level  Lower Body Bathing Moderate assistance;Bed level;Sitting/lateral leans  Upper Body Dressing  Moderate assistance;Bed level  Lower Body Dressing Total assistance;Sitting/lateral leans  Lower Body Dressing Details (indicate cue type and reason) to don shoes + AFO, requiring intermittent min G to mod assist for trunk support  Toilet Transfer Minimal assistance;+2 for physical  assistance;+2 for safety/equipment;Cueing for safety;Cueing for sequencing;Ambulation;RW  Toilet Transfer Details (indicate cue type and  reason) simulated with functional ambulation in hallway, decreased motor control of R LE with stepping and require hand over hand for maintaining R grip on walker with poor wrist mobility  Toileting- Clothing Manipulation and Hygiene Total assistance  Functional mobility during ADLs Minimal assistance;+2 for physical assistance;+2 for safety/equipment;Cueing for safety;Cueing for sequencing;Rolling walker  General ADL Comments patient requiring increased assistance with self care due to decreased strength, coordination, activity tolerance, safety awareness, balance  Bed Mobility  Overal bed mobility Needs Assistance  Bed Mobility Supine to Sit  Supine to sit Mod assist  General bed mobility comments Pt shifting self to EOB but requiring assist to manage R LE over EOB and to bring trunk to upright  Transfers  Overall transfer level Needs assistance  Equipment used Rolling walker (2 wheeled)  Transfers Sit to/from Stand  Sit to Stand Min assist;+2 physical assistance;+2 safety/equipment;From elevated surface  General transfer comment cues for body mechanics and to shift weight forward in standing due to posterior lean. Patient require assist to transition R hand to walker  Balance  Overall balance assessment Needs assistance  Sitting-balance support Feet supported;Single extremity supported  Sitting balance-Leahy Scale Poor  Postural control Posterior lean  Standing balance support Bilateral upper extremity supported  Standing balance-Leahy Scale Poor  Standing balance comment min A x2 and RW  OT - End of Session  Equipment Utilized During Treatment Gait belt;Rolling walker  Activity Tolerance Patient tolerated treatment well  Patient left in chair;with call bell/phone within reach;with chair alarm set  Nurse Communication Mobility status  OT Assessment   OT Recommendation/Assessment Patient needs continued OT Services  OT Visit Diagnosis Unsteadiness on feet (R26.81);Other abnormalities of gait and mobility (R26.89);Muscle weakness (generalized) (M62.81);Other symptoms and signs involving cognitive function  OT Problem List Decreased strength;Decreased range of motion;Decreased activity tolerance;Impaired balance (sitting and/or standing);Decreased coordination;Decreased cognition;Decreased safety awareness;Impaired tone;Impaired UE functional use  OT Plan  OT Frequency (ACUTE ONLY) Min 2X/week  OT Treatment/Interventions (ACUTE ONLY) Self-care/ADL training;Therapeutic exercise;Neuromuscular education;Energy conservation;DME and/or AE instruction;Therapeutic activities;Cognitive remediation/compensation;Patient/family education;Balance training  AM-PAC OT "6 Clicks" Daily Activity Outcome Measure (Version 2)  Help from another person eating meals? 3  Help from another person taking care of personal grooming? 3  Help from another person toileting, which includes using toliet, bedpan, or urinal? 2  Help from another person bathing (including washing, rinsing, drying)? 2  Help from another person to put on and taking off regular upper body clothing? 2  Help from another person to put on and taking off regular lower body clothing? 1  6 Click Score 13  OT Recommendation  Follow Up Recommendations Home health OT;Supervision/Assistance - 24 hour  OT Equipment None recommended by OT (family has necessary home DME)  Individuals Consulted  Consulted and Agree with Results and Recommendations Patient  Acute Rehab OT Goals  Patient Stated Goal Walk in hallway  OT Goal Formulation With patient/family  Time For Goal Achievement 10/16/19  Potential to Achieve Goals Good  OT Time Calculation  OT Start Time (ACUTE ONLY) 1110  OT Stop Time (ACUTE ONLY) 1143  OT Time Calculation (min) 33 min  OT General Charges  $OT Visit 1 Visit  OT Evaluation  $OT  Eval Moderate Complexity 1 Mod  Written Expression  Dominant Hand Right   Delbert Phenix OT OT pager: [phone]

## 2019-10-02 NOTE — Evaluation (Signed)
Physical Therapy Evaluation Patient Details Name: Gregory Moreno MRN: [id number] DOB: September 11, 1995 Today's Date: 10/02/2019   History of Present Illness  Pt admitted from [facility] with AMS and N/V.  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  Pt now with spread of CA noted on imaging.  Clinical Impression  Pt admitted as above and presenting with functional mobility limitations 2* generalized weakness, R side hemiparesis with intermittent increased tone R LE, and balance deficits.  Pt very cooperative and parents very supportive - hope to progress to return to parents home and could benefit from follow up [place].  This date pt requiring assist of two for safe performance of all mobility tasks but was able to ambulate 72' in hall with RW.    Follow Up Recommendations Home health PT    Equipment Recommendations  Other (comment) (May need to consider hospital bed depending on progress)    Recommendations for Other Services       Precautions / Restrictions Precautions Precautions: Fall Required Braces or Orthoses: Other Brace (R AFO) Restrictions Weight Bearing Restrictions: No      Mobility  Bed Mobility Overal bed mobility: Needs Assistance Bed Mobility: Supine to Sit     Supine to sit: Min assist;Mod assist     General bed mobility comments: Pt shifting self to EOB but requiring assist to manage R LE over EOB and to bring trunk to upright  Transfers Overall transfer level: Needs assistance Equipment used: Rolling walker (2 wheeled) Transfers: Sit to/from Stand Sit to Stand: Min assist;+2 physical assistance;+2 safety/equipment;From elevated surface         General transfer comment: Cues for transition position and use of UEs to self assist.  Physical assist to bring wt up and fwd and to balance in standing with RW  Ambulation/Gait Ambulation/Gait assistance: Min assist;Mod assist;+2 physical assistance;+2 safety/equipment Gait Distance (Feet): 80  Feet Assistive device: Rolling walker (2 wheeled) Gait Pattern/deviations: Step-to pattern;Step-through pattern;Decreased step length - right;Decreased step length - left;Shuffle;Narrow base of support;Decreased stance time - right Gait velocity: decr   General Gait Details: Pt with steppage noted on R and with difficulty placing R foot appropriately; Physical assist for balance/support, for RW management and to maintain grip on R side RW  Stairs            Wheelchair Mobility    Modified Rankin (Stroke Patients Only)       Balance Overall balance assessment: Needs assistance Sitting-balance support: Feet supported;Single extremity supported Sitting balance-Leahy Scale: Poor Sitting balance - Comments: posterior drift   Standing balance support: Bilateral upper extremity supported Standing balance-Leahy Scale: Poor                               Pertinent Vitals/Pain Pain Assessment: No/denies pain    Home Living Family/patient expects to be discharged to:: Private residence Living Arrangements: Parent Available Help at Discharge: Family;Available 24 hours/day Type of Home: House Home Access: Ramped entrance Entrance Stairs-Rails: None   Home Layout: Multi-level;Able to live on main level with bedroom/bathroom Home Equipment: Kasandra Knudsen - single point;Bedside commode;Shower seat;Walker - 2 wheels;Wheelchair - Education administrator (comment)      Prior Function Level of Independence: Needs assistance   Gait / Transfers Assistance Needed: Pt ambulating sans AD in August but progressed to use of cane and most recently largely using WC  ADL's / Homemaking Assistance Needed: Assist of parents  Hand Dominance   Dominant Hand: Right    Extremity/Trunk Assessment   Upper Extremity Assessment Upper Extremity Assessment: RUE deficits/detail;Defer to OT evaluation    Lower Extremity Assessment Lower Extremity Assessment: RLE deficits/detail RLE Deficits /  Details: Intermittent increased tone; difficult to disseminate voluntary muscle strength RLE Coordination: decreased gross motor;decreased fine motor    Cervical / Trunk Assessment Cervical / Trunk Assessment: Normal  Communication   Communication: Expressive difficulties  Cognition Arousal/Alertness: Awake/alert Behavior During Therapy: WFL for tasks assessed/performed;Flat affect Overall Cognitive Status: Within Functional Limits for tasks assessed                                 General Comments: Some delayed processing noted and occasional repetition of cues required      General Comments      Exercises     Assessment/Plan    PT Assessment Patient needs continued PT services  PT Problem List Decreased strength;Decreased activity tolerance;Decreased balance;Decreased mobility;Decreased knowledge of use of DME;Decreased safety awareness       PT Treatment Interventions DME instruction;Gait training;Functional mobility training;Therapeutic activities;Therapeutic exercise;Balance training;Patient/family education    PT Goals (Current goals can be found in the Care Plan section)  Acute Rehab PT Goals Patient Stated Goal: Walk in hallway PT Goal Formulation: With patient Time For Goal Achievement: 10/16/19 Potential to Achieve Goals: Fair    Frequency Min 3X/week   Barriers to discharge        Co-evaluation PT/OT/SLP Co-Evaluation/Treatment: Yes Reason for Co-Treatment: Complexity of the patient's impairments (multi-system involvement);For patient/therapist safety PT goals addressed during session: Mobility/safety with mobility OT goals addressed during session: ADL's and self-care       AM-PAC PT "6 Clicks" Mobility  Outcome Measure Help needed turning from your back to your side while in a flat bed without using bedrails?: A Little Help needed moving from lying on your back to sitting on the side of a flat bed without using bedrails?: A Lot Help  needed moving to and from a bed to a chair (including a wheelchair)?: A Lot Help needed standing up from a chair using your arms (e.g., wheelchair or bedside chair)?: A Little Help needed to walk in hospital room?: A Lot Help needed climbing 3-5 steps with a railing? : A Lot 6 Click Score: 14    End of Session Equipment Utilized During Treatment: Gait belt Activity Tolerance: Patient tolerated treatment well Patient left: in chair;with call bell/phone within reach;with chair alarm set;with family/visitor present Nurse Communication: Mobility status PT Visit Diagnosis: Unsteadiness on feet (R26.81);Difficulty in walking, not elsewhere classified (R26.2);Ataxic gait (R26.0)    Time: 1110-1147 PT Time Calculation (min) (ACUTE ONLY): 37 min   Charges:   PT Evaluation $PT Eval Moderate Complexity: 1 Mod          [place] Pager [phone] Office [phone]   Brynlee Pennywell 10/02/2019, 12:34 PM

## 2019-10-02 NOTE — Progress Notes (Signed)
OT Cancellation Note  Patient Details Name: Gregory Moreno MRN: [id number] DOB: [birth year]   Cancelled Treatment:    Reason Eval/Treat Not Completed: Other (comment) Attempted to see patient this AM however sleeping soundly. Will re-attempt later today as schedule permits.  Delbert Phenix OT OT pager: [place] 10/02/2019, 9:16 AM

## 2019-10-02 NOTE — Plan of Care (Signed)
Pt VS WNL this am.  No complaints at this time.   Problem: Education: Goal: Knowledge of General Education information will improve Description: Including pain rating scale, medication(s)/side effects and non-pharmacologic comfort measures Outcome: Progressing   Problem: Clinical Measurements: Goal: Ability to maintain clinical measurements within normal limits will improve Outcome: Progressing Goal: Will remain free from infection Outcome: Progressing Goal: Diagnostic test results will improve Outcome: Progressing Goal: Respiratory complications will improve Outcome: Progressing Goal: Cardiovascular complication will be avoided Outcome: Progressing   Problem: Coping: Goal: Level of anxiety will decrease Outcome: Progressing   Problem: Elimination: Goal: Will not experience complications related to bowel motility Outcome: Progressing Goal: Will not experience complications related to urinary retention Outcome: Progressing   Problem: Pain Managment: Goal: General experience of comfort will improve Outcome: Progressing   Problem: Safety: Goal: Ability to remain free from injury will improve Outcome: Progressing   Problem: Skin Integrity: Goal: Risk for impaired skin integrity will decrease Outcome: Progressing   Problem: Education: Goal: Knowledge of the prescribed therapeutic regimen will improve Outcome: Progressing   Problem: Bowel/Gastric: Goal: Will not experience complications related to bowel motility Outcome: Progressing   Problem: Coping: Goal: Ability to identify and develop effective coping behavior will improve Outcome: Progressing   Problem: Nutritional: Goal: Maintenance of adequate nutrition will improve Outcome: Progressing   Problem: Health Behavior/Discharge Planning: Goal: Ability to manage health-related needs will improve Outcome: Not Progressing   Problem: Activity: Goal: Risk for activity intolerance will decrease Outcome: Not  Progressing   Problem: Nutrition: Goal: Adequate nutrition will be maintained Outcome: Not Progressing   Problem: Activity: Goal: Ability to implement measures to reduce episodes of fatigue will improve Outcome: Not Progressing

## 2019-10-02 NOTE — Progress Notes (Signed)
Initial Nutrition Assessment  RD working remotely.  DOCUMENTATION CODES:   Not applicable  INTERVENTION:   - Ensure Enlive po BID, each supplement provides 350 kcal and 20 grams of protein  - Encourage adequate PO intake  NUTRITION DIAGNOSIS:   Increased nutrient needs related to cancer and cancer related treatments as evidenced by estimated needs.  GOAL:   Patient will meet greater than or equal to 90% of their needs  MONITOR:   PO intake, Supplement acceptance, Weight trends  REASON FOR ASSESSMENT:   Malnutrition Screening Tool    ASSESSMENT:   24 year old male who presented to the ED from the [facility] with N/V and weakness. PMH of left temporal glioblastoma s/p surgery, radiation, and chemotherapy.   Per notes, MRI of the brain showing significant progression of pt's glioblastoma. Palliative Care Team has been consulted for symptom management and ongoing [place] discussions.  Per Palliative note yesterday, pt has not needed any N/V medication since starting on decadron and has not had additional seizure-like activity. Per note, pt's appetite has returned and he is eating well. Meal completions documented as 75%.  Unable to reach pt via phone call to room. Reviewed weight history in chart. Pt with a 5.2 kg weight loss since 08/24/19. This is a 6.1% weight loss in just over 1 month which is significant for timeframe. Pt may meet criteria for malnutrition but unable to confirm at this time.  Pt with Ensure Enlive ordered BID. Pt has been accepting 100% of Ensure Enlive supplements per [facility] documentation. Will continue with current regimen.  Medications reviewed and include: IV decadron, Ensure Enlive BID, Keppra IVF: D5 in NS @ 75 ml/hr  Labs reviewed.  UOP: 1775 ml x 24 hours I/O's: +1.5 L since admit  NUTRITION - FOCUSED PHYSICAL EXAM:  Unable to complete at this time. RD working remotely.  Diet Order:   Diet Order            Diet regular Room service  appropriate? Yes; Fluid consistency: Thin  Diet effective now                 EDUCATION NEEDS:   No education needs have been identified at this time  Skin:  Skin Assessment: Reviewed RN Assessment  Last BM:  no documented BM  Height:   Ht Readings from Last 1 Encounters:  09/30/19 5\' 9"  (1.753 m)    Weight:   Wt Readings from Last 1 Encounters:  09/30/19 79.4 kg    Ideal Body Weight:  72.7 kg  BMI:  Body mass index is 25.84 kg/m.  Estimated Nutritional Needs:   Kcal:  2300-2500  Protein:  105-120 grams  Fluid:  >/= 2.0 L    Gaynell Face, MS, RD, LDN Inpatient Clinical Dietitian Please see AMi[facility] for contact information.

## 2019-10-02 NOTE — Consult Note (Signed)
Palliative Care Consultation Note Reason: Symptom Management, Goals of Care  Gregory Moreno is a 24 yo man with glioblastoma s/p resection 10/13/2018 followed by neuro-oncology specialist here at [facility] and also has been evaluated for clinical trials at [facility] who was admitted on 9/16 with refractory Nausea and Vomiting and decline in his neurological status. 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   I met with Gregory Moreno and his dad Maor Meckel who was at bedside during my visit today. I introduced myself and concept of palliative care and share our commitment to support them during this journey. Prior to my visit they had met with Dr. [name] and the results of Gregory Moreno's imaging were discussed. For now they are processing this news and acknowledge they will need to have difficult but necessary discussions about how to proceed and plan- at least as best we can for the possible trajectories that Gregory Moreno's cancer may take.   On my visit Gregory Moreno is calm and in no distress. He is pleasant, makes good eye contact and interactive to the degree that he is able to communicate. He has not needed any NV prns since starting on decadron. No additional seizure like activity, His appetite has returned and he is eating well.  We discussed the use of steroids -benefits and also side effects.  Plan: 1. Full Code- will need to discuss Code Status in detail when they are ready- our intent should be to not cause harm.  2. I will encourage them to always have hope and seek whatever information or options they need to feel that they and we have done everything possible to help him and that we have exhausted all possible treatment options- at least ones that will not deteriorate his functional status and quality of life at the expense of length of life.  3. For now they are processing this news and acknowledge they  will need to have difficult but necessary discussions about how to proceed and plan- at least as best we can for the possible trajectories that Gregory Moreno's cancer may take.   4. Gregory Moreno is drastically improved with his symptoms- likely attributed to higher doses of decadron- he failed multi-drug regimens of anti-emetics. Our goal should be to get him on the lowest dose of decadron that controls his nausea and symptoms and hope that this continues to control the intracranial symptoms. Continue Decadron at current dose-may transition to oral.  5. There may be a role for Cannabinoids to prevent and control nausea and vomiting- will discuss with Gregory Moreno and his family.   6. Dr. [name] to follow again tomorrow. I am available to see Gregory Moreno in the [facility] as an outpatient as needed -now that he is feeling better he is ready to go home.  Lane Hacker, DO Palliative Medicine [phone]

## 2019-10-03 ENCOUNTER — Other Ambulatory Visit: Payer: Self-pay | Admitting: Internal Medicine

## 2019-10-03 DIAGNOSIS — D72829 Elevated white blood cell count, unspecified: Secondary | ICD-10-CM

## 2019-10-03 LAB — CBC
HCT: 44.3 % (ref 39.0–52.0)
Hemoglobin: 14.2 g/dL (ref 13.0–17.0)
MCH: 29.6 pg (ref 26.0–34.0)
MCHC: 32.1 g/dL (ref 30.0–36.0)
MCV: 92.3 fL (ref 80.0–100.0)
Platelets: 211 10*3/uL (ref 150–400)
RBC: 4.8 MIL/uL (ref 4.22–5.81)
RDW: 15 % (ref 11.5–15.5)
WBC: 14.3 10*3/uL — ABNORMAL HIGH (ref 4.0–10.5)
nRBC: 0 % (ref 0.0–0.2)

## 2019-10-03 LAB — BASIC METABOLIC PANEL
Anion gap: 9 (ref 5–15)
BUN: 22 mg/dL — ABNORMAL HIGH (ref 6–20)
CO2: 26 mmol/L (ref 22–32)
Calcium: 9.2 mg/dL (ref 8.9–10.3)
Chloride: 109 mmol/L (ref 98–111)
Creatinine, Ser: 0.66 mg/dL (ref 0.61–1.24)
GFR calc Af Amer: >60 mL/min (ref >60)
GFR calc non Af Amer: >60 mL/min (ref >60)
Glucose, Bld: 109 mg/dL — ABNORMAL HIGH (ref 70–99)
Potassium: 4 mmol/L (ref 3.5–5.1)
Sodium: 144 mmol/L (ref 135–145)

## 2019-10-03 MED ORDER — ONDANSETRON HCL 4 MG PO TABS: 4.0000 mg | ORAL_TABLET | Freq: Three times a day (TID) | ORAL | 0 refills | Status: DC | PRN

## 2019-10-03 MED ORDER — DRONABINOL 5 MG PO CAPS: 5.0000 mg | ORAL_CAPSULE | Freq: Two times a day (BID) | ORAL | 0 refills | Status: DC

## 2019-10-03 MED ORDER — DEXAMETHASONE 4 MG PO TABS: 4.0000 mg | ORAL_TABLET | Freq: Three times a day (TID) | ORAL | 0 refills | Status: DC

## 2019-10-03 NOTE — Progress Notes (Signed)
Physical Therapy Treatment Patient Details Name: Gregory Moreno MRN: [id number] DOB: 1995-03-23 Today's Date: 10/03/2019    History of Present Illness Pt admitted from [facility] with AMS and N/V.  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  Pt now with spread of CA noted on imaging.    PT Comments    Patient more clear this morning, pt and mom hopeful will get to go home today. Patient min A for trunk support this session for bed mobility. Pt maintain midline at EOB with intermittent min G and x1 posterior lean when shoes being donned which patient was able to self correct. Patient min A x1 with cues for body mechanics to stand from EOB, require min A x2 with functional ambulation especially when fatigued and increase in cues for R LE control. Will continue to follow  Follow Up Recommendations  Home health PT     Equipment Recommendations  Other (comment)    Recommendations for Other Services       Precautions / Restrictions Precautions Precautions: Fall Required Braces or Orthoses: Other Brace Restrictions Weight Bearing Restrictions: No    Mobility  Bed Mobility Overal bed mobility: Needs Assistance Bed Mobility: Supine to Sit     Supine to sit: Min assist;HOB elevated     General bed mobility comments: improved trunk control this session requiring min A to elevate trunk  Transfers Overall transfer level: Needs assistance Equipment used: Rolling walker (2 wheeled) Transfers: Sit to/from Stand Sit to Stand: Min assist;+2 safety/equipment;From elevated surface         General transfer comment: patient power up to standing with min A x1 this session with cues for body mechanics, with fatigue patient requiring min A x2 with functional ambulation and OT holding R hand on walker  Ambulation/Gait Ambulation/Gait assistance: Min assist;+2 safety/equipment;+2 physical assistance Gait Distance (Feet): 280 Feet Assistive device: Rolling walker (2  wheeled) Gait Pattern/deviations: Step-to pattern;Step-through pattern;Decreased step length - right;Decreased step length - left;Shuffle;Narrow base of support;Decreased stance time - right Gait velocity: decr   General Gait Details: Pt with steppage noted on R and with difficulty placing R foot appropriately; Physical assist for balance/support, for RW management and to maintain grip on R side RW   Stairs             Wheelchair Mobility    Modified Rankin (Stroke Patients Only)       Balance Overall balance assessment: Needs assistance Sitting-balance support: Feet supported Sitting balance-Leahy Scale: Fair Sitting balance - Comments: only 1 posterior lean when donning shoes, able to correct Postural control: Posterior lean Standing balance support: Bilateral upper extremity supported Standing balance-Leahy Scale: Poor Standing balance comment: min A x2 and RW                            Cognition Arousal/Alertness: Awake/alert Behavior During Therapy: WFL for tasks assessed/performed Overall Cognitive Status: Impaired/Different from baseline Area of Impairment: Following commands;Problem solving                       Following Commands: Follows one step commands with increased time     Problem Solving: Requires verbal cues;Requires tactile cues;Difficulty sequencing General Comments: patient seems clearer this session, however does still require intermittent cues for problem solving/sequencing      Exercises      General Comments        Pertinent Vitals/Pain Pain  Assessment: No/denies pain    Home Living                      Prior Function            PT Goals (current goals can now be found in the care plan section) Acute Rehab PT Goals Patient Stated Goal: Walk in hallway PT Goal Formulation: With patient Time For Goal Achievement: 10/16/19 Potential to Achieve Goals: Fair Progress towards PT goals: Progressing  toward goals    Frequency    Min 3X/week      PT Plan Current plan remains appropriate    Co-evaluation PT/OT/SLP Co-Evaluation/Treatment: Yes Reason for Co-Treatment: Complexity of the patient's impairments (multi-system involvement);For patient/therapist safety PT goals addressed during session: Mobility/safety with mobility OT goals addressed during session: ADL's and self-care      AM-PAC PT "6 Clicks" Mobility   Outcome Measure  Help needed turning from your back to your side while in a flat bed without using bedrails?: A Little Help needed moving from lying on your back to sitting on the side of a flat bed without using bedrails?: A Little Help needed moving to and from a bed to a chair (including a wheelchair)?: A Lot Help needed standing up from a chair using your arms (e.g., wheelchair or bedside chair)?: A Little Help needed to walk in hospital room?: A Lot Help needed climbing 3-5 steps with a railing? : A Lot 6 Click Score: 15    End of Session Equipment Utilized During Treatment: Gait belt Activity Tolerance: Patient tolerated treatment well Patient left: in chair;with call bell/phone within reach;with chair alarm set;with family/visitor present Nurse Communication: Mobility status PT Visit Diagnosis: Unsteadiness on feet (R26.81);Difficulty in walking, not elsewhere classified (R26.2);Ataxic gait (R26.0)     Time: 3235-5732 PT Time Calculation (min) (ACUTE ONLY): 25 min  Charges:  $Gait Training: 8-22 mins                     [place] Pager [phone] Office [phone]    Jersey Espinoza 10/03/2019, 11:52 AM

## 2019-10-03 NOTE — Progress Notes (Signed)
Palliative Care Progress Note  Followed up with Gregory Moreno today and met his mom who was at bedside. Gregory Moreno was OOB in a Chair , he was more interactive today and mostly back to his baseline. He was able to ambulate with a walker in the hallways and he is eating meals. Has required no additional nausea medication.  I spoke with Gregory Moreno and his mom today about planning for his illness trajectory - we spoke about a family centered approach to his care, began a conversation about end of life including the loss of his functional status, uncertainty, fear, anxiety, anger and intense frustration. I also spoke with them about the very broad and normal range of emotions they may be feeling especially now with this unexpected rapid progression. We talked about the pressure to always be "positive" and "Strong" (both of which he seems to be naturally) which can lead to silent suffering, but I also gave him permission to fall apart if needed and that whatever he is feeling positive or negative is valid and strong. They have anticipatory grief and are very aware of how serious his prognosis is and starting to brace for the difficult road ahead. I asked Gregory Moreno what kinds of things brought him 'joy' even in the midst of his illness and while he wasn't able to express details he was able to smile and imagine that joy-  He feels content at home with his parents and with his pets.  I discussed advance care planning with them-his parents are his formal HCPOA-his mom will bring in a copy of that document. We also discussed his code status and in the event of a cardiac arrest DNR would be most appropriate medical intervention to not cause harm. I reassured him this should not in any way limit his treatment options or change the way we are caring for him. I will complete a DNR form and scan into [facility] and provide them with an original.  I discussed use of cannabinoids for refractory nausea and other symptoms. Will prescribe Marinol PRN on  discharge. I explained to them that is was a legal synthetic form of THC that may help with nausea when other agents fail.  TOC: Plan to d/c in AM. I am available to see him as needed in outpatient Med-Onc-Pall clinic. They are awaiting additional discussion with Dr. [name] about [facility] and any possible additional treatment options or clinical trials. Family comfortable meeting his care needs at home.  Gregory Hacker, DO Palliative Medicine [phone]  Time: 35 minutes Greater than 50%  of this time was spent counseling and coordinating care related to the above assessment and plan.

## 2019-10-03 NOTE — Progress Notes (Signed)
Marinol sent to [facility] [address] [facility] [address]- they did not have it in stock at [place].

## 2019-10-03 NOTE — TOC Transition Note (Signed)
Transition of Care [facility]) - CM/SW Discharge Note   Patient Details  Name: Gregory Moreno MRN: [id number] Date of Birth: [birth year]  [facility] CM/SW Contact:  Servando Snare, LCSW Phone Number: 10/03/2019, 11:53 AM   Clinical Narrative:    Patient to dc home with home health. Home health arranged with [facility].   Final next level of care: [place] Barriers to Discharge: No Barriers Identified   Patient Goals and CMS Choice Patient states their goals for this hospitalization and ongoing recovery are:: Go home   Choice offered to / list presented to : NA  Discharge Placement                       Discharge Plan and Services                DME Arranged: N/A DME Agency: NA       HH Arranged: PT, OT HH Agency: [place] Date [place]: 10/03/19 Time [place]: 1594 Representative spoke with at [place]: [place] Interventions     Readmission Risk Interventions Readmission Risk Prevention Plan 10/21/2018  Transportation Screening (No Data)  Some recent data might be hidden

## 2019-10-03 NOTE — Discharge Summary (Signed)
Physician Discharge Summary  Gregory Moreno IEP:[id number] DOB: 02-20-1995 DOA: 09/30/2019  PCP: [facility]  Admit date: 09/30/2019 Discharge date: 10/03/2019  Time spent: 50 minutes  Recommendations for Outpatient Follow-up:  1. Follow-up with Gregory Moreno, neuro oncology in 1 week.  Office will call with appointment time. 2. Follow-up with Gregory Moreno, palliative care.  Office will call with an appointment time. 3. Patient will be discharged home with home health therapies.   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 male with history of left temporal glioblastoma status post surgery radiation and chemotherapy follows up with Gregory Moreno neuro oncologist had just recently followed up on September 24, 2019 at the time was started on Keppra for possible focal seizures and planned to have a repeat MRI within 2 weeks started getting very weak and nausea vomiting with profound weakness was brought to the ER.  ED Course: In the ER patient had MRI of the brain which showed significant progression of his glioblastoma.  Labs were unremarkable Covid test was negative.  Gregory Moreno was consulted.  Patient admitted for further observation.  Hospital Course:  1  Nausea vomiting weakness secondary to severe progression of known glioblastoma Patient presented with nausea vomiting weakness.  MRI brain done with significant progression of his glioblastoma.  Admitting physician spoke with Gregory Moreno neuro oncologist who recommended IV Decadron, antiemetics, hydration with IV fluids, continuation of Keppra.  Gregory Moreno  neuro-oncology has assessed patient and discussed with patient and patient's parents on patient's progression of glioblastoma.  Patient improved clinically had no further nausea or vomiting, tolerating oral intake and able to participate with physical therapy with ambulation in the hallway with rolling walker.  Patient will be discharged home on oral Decadron 4 mg 3 times daily.  Patient will also be discharged home with home health PT.  Outpatient follow-up with Gregory Moreno, neuro oncology in a week.  Dr. Renda Rolls office will be calling patient with outpatient follow-up appointment time.  Patient was also seen by palliative care and patient will follow up with palliative care in the outpatient setting.  2.  Probable Focal seizures Patient recently placed on Keppra for concerns for focal seizures.  During hospitalization patient maintained on IV Keppra and transition back to home regimen of oral Keppra on discharge.  Outpatient follow-up with neuro-oncology.  3.  Leukocytosis Secondary to steroids.  Procedures:  MRI brain 09/30/2019  Consultations: Neuro oncology: Gregory Moreno 10/01/2019 Palliative care: Gregory Moreno 10/01/2019  Discharge Exam: Vitals:   10/02/19 2156 10/03/19 0624  BP: 122/68 117/65  Pulse: 78 61  Resp: 14 14  Temp: 97.7 F (36.5 C) (!) 97.4 F (36.3 C)  SpO2: 96% 97%    General: NAD Cardiovascular: RRR Respiratory: CTAB  Discharge Instructions   Discharge Instructions    Diet general   Complete by: As directed    Increase activity slowly   Complete by: As directed      Allergies as of 10/03/2019   No Known Allergies     Medication List    STOP taking these medications  hydrOXYzine 10 MG tablet Commonly known as: ATARAX/VISTARIL   OLANZapine 5 MG tablet Commonly known as: ZyPREXA   temozolomide 100 MG capsule Commonly known as: TEMODAR     TAKE these medications   dexamethasone 4 MG tablet Commonly known as: DECADRON Take 1 tablet (4 mg  total) by mouth 3 (three) times daily.   dronabinol 5 MG capsule Commonly known as: Marinol Take 1 capsule (5 mg total) by mouth 2 (two) times daily before lunch and supper.   escitalopram 5 MG tablet Commonly known as: LEXAPRO TAKE 1 TABLET BY MOUTH EVERYDAY AT BEDTIME What changed: See the new instructions.   levETIRAcetam 250 MG tablet Commonly known as: Keppra Take 1 tablet (250 mg total) by mouth 2 (two) times daily.   ondansetron 4 MG tablet Commonly known as: Zofran Take 1 tablet (4 mg total) by mouth every 8 (eight) hours as needed for nausea or vomiting. What changed: Another medication with the same name was removed. Continue taking this medication, and follow the directions you see here.   Sarna lotion Generic drug: camphor-menthol Apply 1 application topically as needed for itching.      No Known Allergies  Follow-up Information    Ventura Sellers, MD. Schedule an appointment as soon as possible for a visit in 1 week(s).   Specialties: Psychiatry, Neurology, Oncology Why: office will call with outpatient f/u appointment time. Contact information: [place] [address] [phone]        Lane Hacker L, DO Follow up.   Specialty: Internal Medicine Why: office will call with follow up appointment time. Contact information: [place] [address] [phone]                The results of significant diagnostics from this hospitalization (including imaging, microbiology, ancillary and laboratory) are listed below for reference.    Significant Diagnostic Studies: MR Brain W and Wo Contrast  Result Date: 09/30/2019 CLINICAL DATA:  24 year old male with left temporal glioblastoma status post resection in September 2020, postoperative radiation using IMRT.  Altered mental status. EXAM: MRI HEAD WITHOUT AND WITH CONTRAST TECHNIQUE: Multiplanar, multiecho pulse sequences of the brain and surrounding  structures were obtained without and with intravenous contrast. CONTRAST:  7.58mL GADAVIST GADOBUTROL 1 MMOL/ML IV SOLN COMPARISON:  Brain MRI 08/21/2019 and earlier. FINDINGS: Brain: Severe 1 month progression of confluent and masslike T2 and FLAIR hyperintensity along the genu of the corpus callosum and in both frontal lobes left greater than right (series 11, image 33). Nodular likely hypercellular related restricted diffusion in both the corpus callosum and along the body of the right lateral ventricle has progressed. Similar progression of nodular restricted diffusion along the left splenium of the corpus callosum and left periatrial white matter, along with similar marked increased left parietal lobe masslike T2 and FLAIR hyperintensity. Following contrast today there is extensive progression of irregular periventricular enhancement along the anterior and posterior corpus callosum, and involving most of the lateral periventricular soft tissue bilaterally (series 16, image 92). Associated new nodular enhancement of the fornix. No ependymal enhancement identified along the lower 3rd ventricle, the aqueduct, or 4th ventricle. However, there is abnormal leptomeningeal enhancement now along the ventral brainstem and involving the bilateral 7th/8th cranial nerves at the IACs (series 16, images 38-45). Mild new mass effect on both lateral ventricles. Mildly effaced suprasellar cistern. Other basilar cisterns remain patent. There is trace leftward midline shift at the septum pellucidum. Superimposed post resection encephalomalacia in  the left temporal lobe. Brainstem Wallerian degeneration again suspected. No restricted diffusion suggestive of acute infarction. No ventriculomegaly, new extra-axial collection or acute intracranial hemorrhage. Cervicomedullary junction and pituitary are within normal limits. Vascular: Major intracranial vascular flow voids are stable. Skull and upper cervical spine: Previous left  craniotomy, otherwise negative. Sinuses/Orbits: Rightward gaze deviation today. Paranasal sinuses and mastoids are stable and well pneumatized. Other: Abnormal enhancement in both IACs. Stable scalp soft tissues. IMPRESSION: 1. Severe progression of tumor since 08/21/2019. Increased multifocal periventricular, pericallosal, and also brainstem leptomeningeal burden of disease. Increased edema versus nonenhancing tumor in both anterior frontal lobes and the left parietal lobe. 2. Mild new intracranial mass effect, with no midline shift or loss of basilar cistern patency. Electronically Signed   By: Genevie Ann M.D.   On: 09/30/2019 19:07    Microbiology: Recent Results (from the past 240 hour(s))  SARS Coronavirus 2 by RT PCR (hospital order, performed in [facility] hospital lab) Nasopharyngeal Nasopharyngeal Swab     Status: None   Collection Time: 09/30/19  4:19 PM   Specimen: Nasopharyngeal Swab  Result Value Ref Range Status   SARS Coronavirus 2 NEGATIVE NEGATIVE Final    Comment: (NOTE) SARS-CoV-2 target nucleic acids are NOT DETECTED.  The SARS-CoV-2 RNA is generally detectable in upper and lower respiratory specimens during the acute phase of infection. The lowest concentration of SARS-CoV-2 viral copies this assay can detect is 250 copies / mL. A negative result does not preclude SARS-CoV-2 infection and should not be used as the sole basis for treatment or other patient management decisions.  A negative result may occur with improper specimen collection / handling, submission of specimen other than nasopharyngeal swab, presence of viral mutation(s) within the areas targeted by this assay, and inadequate number of viral copies (<250 copies / mL). A negative result must be combined with clinical observations, patient history, and epidemiological information.  Fact Sheet for Patients:   StrictlyIdeas.no  Fact Sheet for Healthcare  Providers: BankingDealers.co.za  This test is not yet approved or  cleared by the [place] FDA and has been authorized for detection and/or diagnosis of SARS-CoV-2 by FDA under an Emergency Use Authorization (EUA).  This EUA will remain in effect (meaning this test can be used) for the duration of the COVID-19 declaration under Section 564(b)(1) of the Act, 21 U.S.C. section 360bbb-3(b)(1), unless the authorization is terminated or revoked sooner.  Performed at [facility], [place] [address] [place], [place] [address]      Labs: Basic Metabolic Panel: Recent Labs  Lab 09/30/19 1617 10/01/19 0606 10/02/19 0541 10/03/19 0630  NA 141 139 141 144  K 4.2 4.7 3.6 4.0  CL 100 103 105 109  CO2 29 27 26 26   GLUCOSE 96 107* 116* 109*  BUN 17 11 19  22*  CREATININE 0.84 0.73 0.76 0.66  CALCIUM 9.7 9.7 9.1 9.2  MG  --   --  2.1  --    Liver Function Tests: Recent Labs  Lab 09/30/19 1617  AST 18  ALT 25  ALKPHOS 73  BILITOT 0.7  PROT 8.1  ALBUMIN 4.1   Recent Labs  Lab 09/30/19 1617  LIPASE 28   No results for input(s): AMMONIA in the last 168 hours. CBC: Recent Labs  Lab 09/30/19 1617 10/01/19 0606 10/02/19 0541 10/03/19 0630  WBC 8.4 6.3 13.2* 14.3*  NEUTROABS 6.9  --   --   --   HGB 15.4 16.0 14.8 14.2  HCT  47.4 48.9 45.8 44.3  MCV 92.4 90.4 92.9 92.3  PLT 209 192 219 211   Cardiac Enzymes: No results for input(s): CKTOTAL, CKMB, CKMBINDEX, TROPONINI in the last 168 hours. BNP: BNP (last 3 results) No results for input(s): BNP in the last 8760 hours.  ProBNP (last 3 results) No results for input(s): PROBNP in the last 8760 hours.  CBG: No results for input(s): GLUCAP in the last 168 hours.     Signed:  Irine Seal MD.  [facility] 10/03/2019, 11:31 AM

## 2019-10-03 NOTE — Progress Notes (Signed)
Occupational Therapy Progress Note  Patient more clear this morning, pt and mom hopeful will get to go home today. Patient min A for trunk support this session for bed mobility. Pt maintain midline at EOB with intermittent min G and x1 posterior lean when shoes being donned which patient was able to self correct. Patient min A x1 with cues for body mechanics to stand from EOB, require min A x2 with functional ambulation especially when fatigued and increase in cues for R LE control. Will continue to follow.    10/03/19 1000  OT Visit Information  Last OT Received On 10/03/19  Assistance Needed +2  PT/OT/SLP Co-Evaluation/Treatment Yes  Reason for Co-Treatment Complexity of the patient's impairments (multi-system involvement);For patient/therapist safety;To address functional/ADL transfers  OT goals addressed during session ADL's and self-care  History of Present Illness Pt admitted from [facility] with AMS and N/V.  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  Pt now with spread of CA noted on imaging.  Precautions  Precautions Fall  Required Braces or Orthoses Other Brace (R AFO)  Pain Assessment  Pain Assessment No/denies pain  Cognition  Arousal/Alertness Awake/alert  Behavior During Therapy WFL for tasks assessed/performed  Overall Cognitive Status Impaired/Different from baseline  Area of Impairment Following commands;Problem solving  Following Commands Follows one step commands with increased time  Problem Solving Requires verbal cues;Requires tactile cues;Difficulty sequencing  General Comments patient seems clearer this session, however does still require intermittent cues for problem solving/sequencing  ADL  Overall ADL's  Needs assistance/impaired  Upper Body Dressing  Moderate assistance;Sitting  Upper Body Dressing Details (indicate cue type and reason) to don clean gown, patient with improved sitting balance this session requiring mod A primarily for R UE   Lower Body Dressing Total assistance;Sitting/lateral leans  Lower Body Dressing Details (indicate cue type and reason) to don shoes and AFO, patient intermittent min G assist this morning with improved sitting balance/trunk control  Toilet Transfer Minimal assistance;+2 for physical assistance;+2 for safety/equipment;Cueing for safety;Cueing for sequencing;Ambulation;RW  Toilet Transfer Details (indicate cue type and reason) simulated with functional ambulation and transfer into recliner, patient requiring increased cues with fatigue   Functional mobility during ADLs Minimal assistance;+2 for physical assistance;+2 for safety/equipment;Cueing for safety;Cueing for sequencing;Rolling walker  Bed Mobility  Overal bed mobility Needs Assistance  Bed Mobility Supine to Sit  Supine to sit Min assist;HOB elevated  General bed mobility comments improved trunk control this session requiring min A to elevate trunk  Balance  Overall balance assessment Needs assistance  Sitting-balance support Feet supported  Sitting balance-Leahy Scale Fair  Sitting balance - Comments only 1 posterior lean when donning shoes, able to correct  Standing balance support Bilateral upper extremity supported  Standing balance-Leahy Scale Poor  Standing balance comment min A x2 and RW  Restrictions  Weight Bearing Restrictions No  Transfers  Overall transfer level Needs assistance  Equipment used Rolling walker (2 wheeled)  Transfers Sit to/from Stand  Sit to Stand Min assist;+2 safety/equipment;From elevated surface  General transfer comment patient power up to standing with min A x1 this session with cues for body mechanics, with fatigue patient requiring min A x2 with functional ambulation and OT holding R hand on walker  OT - End of Session  Equipment Utilized During Treatment Gait belt;Rolling walker  Activity Tolerance Patient tolerated treatment well  Patient left in chair;with call bell/phone within reach;with  family/visitor present;with nursing/sitter in room  Nurse Communication Mobility status  OT Assessment/Plan  OT Plan Discharge plan remains appropriate  OT Visit Diagnosis Unsteadiness on feet (R26.81);Other abnormalities of gait and mobility (R26.89);Muscle weakness (generalized) (M62.81);Other symptoms and signs involving cognitive function  OT Frequency (ACUTE ONLY) Min 2X/week  Follow Up Recommendations Home health OT;Supervision/Assistance - 24 hour  OT Equipment None recommended by OT  AM-PAC OT "6 Clicks" Daily Activity Outcome Measure (Version 2)  Help from another person eating meals? 3  Help from another person taking care of personal grooming? 3  Help from another person toileting, which includes using toliet, bedpan, or urinal? 2  Help from another person bathing (including washing, rinsing, drying)? 2  Help from another person to put on and taking off regular upper body clothing? 2  Help from another person to put on and taking off regular lower body clothing? 1  6 Click Score 13  OT Goal Progression  Progress towards OT goals Progressing toward goals  Acute Rehab OT Goals  Patient Stated Goal Walk in hallway  OT Goal Formulation With patient/family  Time For Goal Achievement 10/16/19  Potential to Achieve Goals Good  ADL Goals  Pt Will Perform Grooming sitting;with supervision (EOB)  Pt Will Perform Upper Body Dressing with supervision;sitting (EOB)  Pt Will Transfer to Toilet ambulating;bedside commode;with min assist (walker)  Additional ADL Goal #1 Patient will demonstrate fair sitting balance for 8 minutes in order to engage in self care tasks.  OT Time Calculation  OT Start Time (ACUTE ONLY) 0845  OT Stop Time (ACUTE ONLY) 0911  OT Time Calculation (min) 26 min  OT General Charges  $OT Visit 1 Visit  OT Treatments  $Self Care/Home Management  8-22 mins   Delbert Phenix OT OT pager: [phone]

## 2019-10-03 NOTE — Progress Notes (Signed)
Nurse reviewed discharge instructions with pt and his mother.  Pt and mother verbalized understanding of discharge instructions, follow up appointments and new medications.  DNR form and prescriptions given to pt's mother prior to discharge.

## 2019-10-05 ENCOUNTER — Ambulatory Visit: Payer: Self-pay | Admitting: Physical Therapy

## 2019-10-06 ENCOUNTER — Telehealth: Payer: Self-pay | Admitting: Pharmacist

## 2019-10-06 ENCOUNTER — Inpatient Hospital Stay (HOSPITAL_BASED_OUTPATIENT_CLINIC_OR_DEPARTMENT_OTHER): Payer: 59 | Admitting: Internal Medicine

## 2019-10-06 ENCOUNTER — Other Ambulatory Visit: Payer: Self-pay

## 2019-10-06 VITALS — BP 116/85 | HR 82 | Temp 97.4°F | Resp 18 | Ht 69.0 in

## 2019-10-06 DIAGNOSIS — C712 Malignant neoplasm of temporal lobe: Secondary | ICD-10-CM | POA: Diagnosis present

## 2019-10-06 DIAGNOSIS — C719 Malignant neoplasm of brain, unspecified: Secondary | ICD-10-CM | POA: Diagnosis not present

## 2019-10-06 DIAGNOSIS — R112 Nausea with vomiting, unspecified: Secondary | ICD-10-CM | POA: Diagnosis not present

## 2019-10-06 MED ORDER — TEMOZOLOMIDE 100 MG PO CAPS: 100.0000 mg | ORAL_CAPSULE | Freq: Every day | ORAL | 0 refills | Status: DC

## 2019-10-06 NOTE — Progress Notes (Signed)
START ON PATHWAY REGIMEN - Neuro     A cycle is every 14 days:     Bevacizumab-xxxx   **Always confirm dose/schedule in your pharmacy ordering system**  Patient Characteristics: Glioblastoma (Grade IV Glioma), Recurrent or Progressive, Nonsurgical Candidate, Systemic Therapy Candidate Disease Classification: Glioma Disease Classification: Glioblastoma (Grade IV Glioma) Disease Status: Recurrent or Progressive Treatment Classification: Nonsurgical Candidate Treatment (Nonsurgical/Adjuvant): Systemic Therapy Candidate Intent of Therapy: Non-Curative / Palliative Intent, Discussed with Patient

## 2019-10-06 NOTE — Progress Notes (Signed)
[facility] [place], [place] [address] [phone]   Interval Evaluation  Date of Service: 10/06/19 Patient Name: Gregory Moreno Patient MRN: [id number] Patient DOB: [birth year] Provider: Ventura Sellers, MD  Identifying Statement:  Gregory Moreno is a 24 y.o. male with left temporal glioblastoma   Referring Provider: [facility] [address] [place],  [place] [address]  Oncologic History: Oncology History  Glioblastoma multiforme of temporal lobe ([place])  10/13/2018 Surgery   Craniotomy, debulking resection and hematoma evacuation with Dr. Zada Finders.   12/02/2018 - 01/13/2019 Radiation Therapy   IMRT and concurrent Temodar 2mm2   02/09/2019 -  Chemotherapy   The patient had temozolomide (TEMODAR) 100 MG capsule, 100 mg (original dose 150 mg/m2/day), Oral, Daily, 1 of 1 cycle, Start date: 10/06/2019, End date: -- Dose modification: 150 mg/m2/day (original dose 150 mg/m2/day, Cycle 1), 200 mg/m2/day (original dose 150 mg/m2/day, Cycle 1), 100 mg (original dose 150 mg/m2/day, Cycle 1)  for chemotherapy treatment.    [place]  10/[place]/2019 Initial Diagnosis   [place] ([facility]   10/13/2019 -  Chemotherapy   The patient had bevacizumab-bvzr (ZIRABEV) 800 mg in sodium chloride 0.9 % 100 mL chemo infusion, 10 mg/kg, Intravenous,  Once, 0 of 6 cycles  for chemotherapy treatment.      Biomarkers:  MGMT Methylated.  IDH 1/2 Wild type.  EGFR Not expressed  TERT Unmutated   Interval History:  Gregory CROTTEAUpresents today for follow up after recent hospitalization for refractory vomiting, dehydration, failure to thrive. Nausea and vomiting have remitted since initiating the decadron 429mtwice per day, in addition to the zofran three times per day.  He is overall much weaker in the past week, in particular on the left side.  Fatigue is high and energy level is poor overall.  No further  seizures after several shaking event and one large seizure noted at prior visit, continues on Keppra 25059mwice per day.  Inconinence of urine is occurring at night time, this is also new.  No urgency or lack of sensation in the daytime.  Denies neck or back pain.  Medications: Current Outpatient Medications on File Prior to Visit  Medication Sig Dispense Refill  . dexamethasone (DECADRON) 4 MG tablet Take 1 tablet (4 mg total) by mouth 3 (three) times daily. 30 tablet 0  . dronabinol (MARINOL) 5 MG capsule Take 1 capsule (5 mg total) by mouth 2 (two) times daily before lunch and supper. 60 capsule 0  . escitalopram (LEXAPRO) 5 MG tablet TAKE 1 TABLET BY MOUTH EVERYDAY AT BEDTIME (Patient taking differently: Take 5 mg by mouth at bedtime. ) 90 tablet 1  . levETIRAcetam (KEPPRA) 250 MG tablet Take 1 tablet (250 mg total) by mouth 2 (two) times daily. 60 tablet 3  . ondansetron (ZOFRAN) 4 MG tablet Take 1 tablet (4 mg total) by mouth every 8 (eight) hours as needed for nausea or vomiting. 20 tablet 0  . camphor-menthol (SARNA) lotion Apply 1 application topically as needed for itching. (Patient not taking: Reported on 09/30/2019) 222 mL 0  . [DISCONTINUED] escitalopram (LEXAPRO) 5 MG tablet TAKE 1 TABLET BY MOUTH EVERYDAY AT BEDTIME 30 tablet 0   No current facility-administered medications on file prior to visit.    Allergies: No Known Allergies Past Medical History: No past medical history on file. Past Surgical History:  Past Surgical History:  Procedure Laterality Date  . CRANIOTOMY Left  10/13/2018   Procedure: LEFT CRANIOTOMY FOR TUMOR RESECTION;  Surgeon: Gregory Part, MD;  Location: [facility];  Service: Neurosurgery;  Laterality: Left;   Social History:  Social History   Socioeconomic History  . Marital status: Single    Spouse name: Not on file  . Number of children: Not on file  . Years of education: Not on file  . Highest education level: Not on file  Occupational History    . Not on file  Tobacco Use  . Smoking status: Never Smoker  . Smokeless tobacco: Never Used  Substance and Sexual Activity  . Alcohol use: Not on file  . Drug use: Not on file  . Sexual activity: Not on file  Other Topics Concern  . Not on file  Social History Narrative  . Not on file   Social Determinants of Health   Financial Resource Strain:   . Difficulty of Paying Living Expenses: Not on file  Food Insecurity:   . Worried About Charity fundraiser in the Last Year: Not on file  . Ran Out of Food in the Last Year: Not on file  Transportation Needs:   . Lack of Transportation (Medical): Not on file  . Lack of Transportation (Non-Medical): Not on file  Physical Activity:   . Days of Exercise per Week: Not on file  . Minutes of Exercise per Session: Not on file  Stress:   . Feeling of Stress : Not on file  Social Connections:   . Frequency of Communication with Friends and Family: Not on file  . Frequency of Social Gatherings with Friends and Family: Not on file  . Attends Religious Services: Not on file  . Active Member of Clubs or Organizations: Not on file  . Attends Archivist Meetings: Not on file  . Marital Status: Not on file  Intimate Partner Violence:   . Fear of Current or Ex-Partner: Not on file  . Emotionally Abused: Not on file  . Physically Abused: Not on file  . Sexually Abused: Not on file   Family History: No family history on file.  Review of Systems: Constitutional: Denies fevers, chills or abnormal weight loss Eyes: Denies blurriness of vision Ears, nose, mouth, throat, and face: Denies mucositis or sore throat Respiratory: Denies cough, dyspnea or wheezes Cardiovascular: Denies palpitation, chest discomfort or lower extremity swelling Gastrointestinal:  Denies nausea, constipation, diarrhea GU: Denies dysuria or incontinence Skin: Denies abnormal skin rashes Neurological: Per HPI Musculoskeletal: Denies joint pain, back or neck  discomfort. No decrease in ROM Behavioral/Psych: Denies anxiety, disturbance in thought content, and mood instability  Physical Exam: Vitals:   10/06/19 1432  BP: 116/85  Pulse: 82  Resp: 18  Temp: (!) 97.4 F (36.3 C)  SpO2: 97%   KPS: 70. General: Alert, cooperative, pleasant, in no acute distress Head: Craniotomy scar noted, dry and intact. EENT: No conjunctival injection or scleral icterus. Oral mucosa moist Lungs: Resp effort normal Cardiac: Regular rate and rhythm Abdomen: Soft, non-distended abdomen Skin: No rashes cyanosis or petechiae. Extremities: No clubbing or edema  Neurologic Exam: Mental Status: Awake, alert, attentive to examiner. Oriented to self and environment. Language demonstrates modest impairments with regards to fluency. Cranial Nerves: Visual acuity is grossly normal. Dense R homonymous hemianopia. Extra-ocular movements intact. No ptosis. Face is symmetric, tongue midline. Motor: Tone and bulk are normal. Power is 2/5 in right arm and leg, now 4/5 on left side. Reflexes are symmetric, no pathologic reflexes present. Sensory:  Intact to light touch and temperature Gait: Dystaxic, hemiparetic  Labs: I have reviewed the data as listed    Component Value Date/Time   NA 144 10/03/2019 0630   K 4.0 10/03/2019 0630   CL 109 10/03/2019 0630   CO2 26 10/03/2019 0630   GLUCOSE 109 (H) 10/03/2019 0630   BUN 22 (H) 10/03/2019 0630   CREATININE 0.66 10/03/2019 0630   CREATININE 0.88 08/24/2019 0912   CALCIUM 9.2 10/03/2019 0630   PROT 8.1 09/30/2019 1617   ALBUMIN 4.1 09/30/2019 1617   AST 18 09/30/2019 1617   AST 11 (L) 08/24/2019 0912   ALT 25 09/30/2019 1617   ALT 15 08/24/2019 0912   ALKPHOS 73 09/30/2019 1617   BILITOT 0.7 09/30/2019 1617   BILITOT 0.4 08/24/2019 0912   GFRNONAA >60 10/03/2019 0630   GFRNONAA >60 08/24/2019 0912   GFRAA >60 10/03/2019 0630   GFRAA >60 08/24/2019 0912   Lab Results  Component Value Date   WBC 14.3 (H)  10/03/2019   NEUTROABS 6.9 09/30/2019   HGB 14.2 10/03/2019   HCT 44.3 10/03/2019   MCV 92.3 10/03/2019   PLT 211 10/03/2019   Imaging:  [facility] Clinician Interpretation: I have personally reviewed the CNS images as listed.  My interpretation, in the context of the patient's clinical presentation, is progressive disease  MR Brain W and Wo Contrast  Result Date: 09/30/2019 CLINICAL DATA:  24 year old male with left temporal glioblastoma status post resection in September 2020, postoperative radiation using IMRT.  Altered mental status. EXAM: MRI HEAD WITHOUT AND WITH CONTRAST TECHNIQUE: Multiplanar, multiecho pulse sequences of the brain and surrounding structures were obtained without and with intravenous contrast. CONTRAST:  7.85m GADAVIST GADOBUTROL 1 MMOL/ML IV SOLN COMPARISON:  Brain MRI 08/21/2019 and earlier. FINDINGS: Brain: Severe 1 month progression of confluent and masslike T2 and FLAIR hyperintensity along the genu of the corpus callosum and in both frontal lobes left greater than right (series 11, image 33). Nodular likely hypercellular related restricted diffusion in both the corpus callosum and along the body of the right lateral ventricle has progressed. Similar progression of nodular restricted diffusion along the left splenium of the corpus callosum and left periatrial white matter, along with similar marked increased left parietal lobe masslike T2 and FLAIR hyperintensity. Following contrast today there is extensive progression of irregular periventricular enhancement along the anterior and posterior corpus callosum, and involving most of the lateral periventricular soft tissue bilaterally (series 16, image 92). Associated new nodular enhancement of the fornix. No ependymal enhancement identified along the lower 3rd ventricle, the aqueduct, or 4th ventricle. However, there is abnormal leptomeningeal enhancement now along the ventral brainstem and involving the bilateral 7th/8th cranial  nerves at the IACs (series 16, images 38-45). Mild new mass effect on both lateral ventricles. Mildly effaced suprasellar cistern. Other basilar cisterns remain patent. There is trace leftward midline shift at the septum pellucidum. Superimposed post resection encephalomalacia in the left temporal lobe. Brainstem Wallerian degeneration again suspected. No restricted diffusion suggestive of acute infarction. No ventriculomegaly, new extra-axial collection or acute intracranial hemorrhage. Cervicomedullary junction and pituitary are within normal limits. Vascular: Major intracranial vascular flow voids are stable. Skull and upper cervical spine: Previous left craniotomy, otherwise negative. Sinuses/Orbits: Rightward gaze deviation today. Paranasal sinuses and mastoids are stable and well pneumatized. Other: Abnormal enhancement in both IACs. Stable scalp soft tissues. IMPRESSION: 1. Severe progression of tumor since 08/21/2019. Increased multifocal periventricular, pericallosal, and also brainstem leptomeningeal burden of disease. Increased edema versus  nonenhancing tumor in both anterior frontal lobes and the left parietal lobe. 2. Mild new intracranial mass effect, with no midline shift or loss of basilar cistern patency. Electronically Signed   By: Genevie Ann M.D.   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  This had been reviewed and discussed with each of them at bedside during his hospitalization here late last week. They understand the limited options for LMD in glioblastoma, and treatment will be aimed at balancing prolonging lifespan with palliation of symptom burden.  We reviewed treatment options which I had discussed additionally with prior consulting oncologist, Vedia Coffer.  Ultimately decision was made to move  forward with daily metronomic Temozolomide dosed at 101m/m2.  In addition, we will treat with Avastin 135mkg IV q2 weeks to treat rapidly progressive bulky enhancing periventricular disease.  Side effects of avastin were reviewed including tumor bed hemorrhage, wound healing abnormalities.   The patient will have a complete blood count performed on days 14 and 28 of each cycle, and a comprehensive metabolic panel performed on day 14 and 28 of each cycle. Labs may need to be performed more often.   Chemotherapy should be held for the following:  ANC less than 1,000  Platelets less than 100,000  LFT or creatinine greater than 2x ULN  If clinical concerns/contraindications develop    Avastin should be held for the following:  ANC less than 500  Platelets less than 50,000  LFT or creatinine greater than 2x ULN  If clinical concerns/contraindications develop    For N/V will continue Zofran 74m674mID and Decadron 4mg74mD.    Keppra will con't at 250mg83m.  Physical therapy will come out to the home and evaluate current needs based on clinical developments per above.  If DME is needed or further in home care we are happy to arrange as able.  All questions were answered. The patient knows to call the clinic with any problems, questions or concerns. No barriers to learning were detected.  I have spent a total of 40 minutes of face-to-face and non-face-to-face time, excluding clinical staff time, preparing to see patient, ordering tests and/or medications, counseling the patient, and independently interpreting results and communicating results to the patient/family/caregiver   ZachaVentura SellersMedical Director of Neuro-Oncology [facility]leBanks Lake South1/21 4:16 PM

## 2019-10-06 NOTE — Telephone Encounter (Signed)
Oral Oncology Pharmacist Encounter   Medication shipment set up with [facility] for Temodar (temozolomide). Medication is going to be expedited and shipped overnight for delivery to patient on 10/07/19 between 10AM - 12PM. Pharmacy confirmed with patient's mother address for shipment and shipping details.  Oral oncology clinic will continue to follow.   Leron Croak, PharmD, BCPS Hematology/Oncology Clinical Pharmacist [facility] [phone] 10/06/2019 4:24 PM

## 2019-10-08 ENCOUNTER — Telehealth: Payer: Self-pay | Admitting: Internal Medicine

## 2019-10-08 ENCOUNTER — Telehealth: Payer: Self-pay | Admitting: *Deleted

## 2019-10-08 ENCOUNTER — Encounter: Payer: Self-pay | Admitting: Internal Medicine

## 2019-10-08 NOTE — Telephone Encounter (Signed)
no 9/21 los

## 2019-10-08 NOTE — Telephone Encounter (Signed)
Received call for verbal confirmation of home health PT orders with [facility] for transfer education/safety needs/ equipment/ and teaching.

## 2019-10-09 ENCOUNTER — Telehealth: Payer: Self-pay | Admitting: Internal Medicine

## 2019-10-09 NOTE — Telephone Encounter (Signed)
Scheduled appt per 9/23 sch msg - pt mother is aware.

## 2019-10-10 ENCOUNTER — Other Ambulatory Visit: Payer: 59

## 2019-10-12 ENCOUNTER — Telehealth: Payer: Self-pay | Admitting: *Deleted

## 2019-10-12 NOTE — Telephone Encounter (Signed)
[name]/ranger with [facility] called to confirm verbal orders for OT 1 x week for 1 week; 2 X week for 3 weeks.  Sharyn Lull [phone]

## 2019-10-13 NOTE — Progress Notes (Signed)
Pharmacist Chemotherapy Monitoring - Initial Assessment    Anticipated start date: 10/19/19   Regimen:  . Are orders appropriate based on the patient's diagnosis, regimen, and cycle? Yes . Does the plan date match the patient's scheduled date? Yes . Is the sequencing of drugs appropriate? Yes . Are the premedications appropriate for the patient's regimen? Yes . Prior Authorization for treatment is: Pending o If applicable, is the correct biosimilar selected based on the patient's insurance? not applicable  Organ Function and Labs: Marland Kitchen Are dose adjustments needed based on the patient's renal function, hepatic function, or hematologic function? No . Are appropriate labs ordered prior to the start of patient's treatment? Yes . Other organ system assessment, if indicated: bevacizumab: baseline BP . The following baseline labs, if indicated, have been ordered: N/A  Dose Assessment: . Are the drug doses appropriate? Yes . Are the following correct: o Drug concentrations Yes o IV fluid compatible with drug Yes o Administration routes Yes o Timing of therapy Yes . If applicable, does the patient have documented access for treatment and/or plans for port-a-cath placement? not applicable . If applicable, have lifetime cumulative doses been properly documented and assessed? not applicable Lifetime Dose Tracking  No doses have been documented on this patient for the following tracked chemicals: Doxorubicin, Epirubicin, Idarubicin, Daunorubicin, Mitoxantrone, Bleomycin, Oxaliplatin, Carboplatin, Liposomal Doxorubicin  o   Toxicity Monitoring/Prevention: . The patient has the following take home antiemetics prescribed: N/A . The patient has the following take home medications prescribed: N/A . Medication allergies and previous infusion related reactions, if applicable, have been reviewed and addressed. Yes . The patient's current medication list has been assessed for drug-drug interactions with  their chemotherapy regimen. no significant drug-drug interactions were identified on review.  Order Review: . Are the treatment plan orders signed? Yes . Is the patient scheduled to see a provider prior to their treatment? No  I verify that I have reviewed each item in the above checklist and answered each question accordingly.  Gregory Moreno K 10/13/2019 1:09 PM

## 2019-10-14 ENCOUNTER — Telehealth: Payer: Self-pay | Admitting: *Deleted

## 2019-10-14 NOTE — Telephone Encounter (Signed)
Mother called to see if it is OK for Gregory Moreno to take baclofen for muscle spasms caused by OT/PT. OK per Dr [name]

## 2019-10-15 ENCOUNTER — Other Ambulatory Visit: Payer: Self-pay | Admitting: Internal Medicine

## 2019-10-15 DIAGNOSIS — C712 Malignant neoplasm of temporal lobe: Secondary | ICD-10-CM

## 2019-10-15 MED ORDER — ONDANSETRON HCL 4 MG PO TABS: 4.0000 mg | ORAL_TABLET | Freq: Three times a day (TID) | ORAL | 3 refills | Status: DC | PRN

## 2019-10-15 MED ORDER — DEXAMETHASONE 4 MG PO TABS
4.0000 mg | ORAL_TABLET | Freq: Three times a day (TID) | ORAL | 3 refills | Status: DC
Start: 2019-10-15 — End: 2019-11-02

## 2019-10-15 MED ORDER — ESCITALOPRAM OXALATE 5 MG PO TABS
5.0000 mg | ORAL_TABLET | Freq: Every day | ORAL | 2 refills | Status: DC
Start: 2019-10-15 — End: 2019-11-26

## 2019-10-15 NOTE — Progress Notes (Signed)
.  The following biosimilar Mvasi (bevacizumab-awwb) has been selected for use in this patient due to insurance requirements.  Henreitta Leber, PharmD

## 2019-10-19 ENCOUNTER — Inpatient Hospital Stay (HOSPITAL_BASED_OUTPATIENT_CLINIC_OR_DEPARTMENT_OTHER): Payer: 59 | Admitting: Internal Medicine

## 2019-10-19 ENCOUNTER — Inpatient Hospital Stay: Payer: 59

## 2019-10-19 ENCOUNTER — Other Ambulatory Visit: Payer: Self-pay | Admitting: Internal Medicine

## 2019-10-19 ENCOUNTER — Inpatient Hospital Stay: Payer: 59 | Attending: Internal Medicine

## 2019-10-19 ENCOUNTER — Other Ambulatory Visit: Payer: Self-pay

## 2019-10-19 VITALS — BP 119/79 | HR 85

## 2019-10-19 VITALS — BP 142/88 | HR 107 | Temp 97.8°F | Resp 18 | Ht 69.0 in

## 2019-10-19 DIAGNOSIS — R569 Unspecified convulsions: Secondary | ICD-10-CM | POA: Diagnosis not present

## 2019-10-19 DIAGNOSIS — C712 Malignant neoplasm of temporal lobe: Secondary | ICD-10-CM | POA: Insufficient documentation

## 2019-10-19 DIAGNOSIS — C719 Malignant neoplasm of brain, unspecified: Secondary | ICD-10-CM

## 2019-10-19 DIAGNOSIS — Z79899 Other long term (current) drug therapy: Secondary | ICD-10-CM | POA: Diagnosis not present

## 2019-10-19 DIAGNOSIS — Z23 Encounter for immunization: Secondary | ICD-10-CM | POA: Diagnosis not present

## 2019-10-19 DIAGNOSIS — Z7189 Other specified counseling: Secondary | ICD-10-CM

## 2019-10-19 DIAGNOSIS — Z5112 Encounter for antineoplastic immunotherapy: Secondary | ICD-10-CM | POA: Insufficient documentation

## 2019-10-19 LAB — CMP (CANCER CENTER ONLY)
ALT: 46 U/L — ABNORMAL HIGH (ref 0–44)
AST: 15 U/L (ref 15–41)
Albumin: 3.4 g/dL — ABNORMAL LOW (ref 3.5–5.0)
Alkaline Phosphatase: 61 U/L (ref 38–126)
Anion gap: 9 (ref 5–15)
BUN: 24 mg/dL — ABNORMAL HIGH (ref 6–20)
CO2: 28 mmol/L (ref 22–32)
Calcium: 9.4 mg/dL (ref 8.9–10.3)
Chloride: 104 mmol/L (ref 98–111)
Creatinine: 0.71 mg/dL (ref 0.61–1.24)
GFR, Est AFR Am: >60 mL/min (ref >60)
GFR, Estimated: >60 mL/min (ref >60)
Glucose, Bld: 111 mg/dL — ABNORMAL HIGH (ref 70–99)
Potassium: 4.1 mmol/L (ref 3.5–5.1)
Sodium: 141 mmol/L (ref 135–145)
Total Bilirubin: 0.4 mg/dL (ref 0.3–1.2)
Total Protein: 7.1 g/dL (ref 6.5–8.1)

## 2019-10-19 LAB — CBC WITH DIFFERENTIAL (CANCER CENTER ONLY)
Abs Immature Granulocytes: 0.57 10*3/uL — ABNORMAL HIGH (ref 0.00–0.07)
Basophils Absolute: 0 10*3/uL (ref 0.0–0.1)
Basophils Relative: 0 %
Eosinophils Absolute: 0 10*3/uL (ref 0.0–0.5)
Eosinophils Relative: 0 %
HCT: 48.2 % (ref 39.0–52.0)
Hemoglobin: 15.8 g/dL (ref 13.0–17.0)
Immature Granulocytes: 4 %
Lymphocytes Relative: 4 %
Lymphs Abs: 0.6 10*3/uL — ABNORMAL LOW (ref 0.7–4.0)
MCH: 30 pg (ref 26.0–34.0)
MCHC: 32.8 g/dL (ref 30.0–36.0)
MCV: 91.5 fL (ref 80.0–100.0)
Monocytes Absolute: 0.8 10*3/uL (ref 0.1–1.0)
Monocytes Relative: 6 %
Neutro Abs: 12.5 10*3/uL — ABNORMAL HIGH (ref 1.7–7.7)
Neutrophils Relative %: 86 %
Platelet Count: 139 10*3/uL — ABNORMAL LOW (ref 150–400)
RBC: 5.27 MIL/uL (ref 4.22–5.81)
RDW: 16.7 % — ABNORMAL HIGH (ref 11.5–15.5)
WBC Count: 14.4 10*3/uL — ABNORMAL HIGH (ref 4.0–10.5)
nRBC: 0 % (ref 0.0–0.2)

## 2019-10-19 LAB — TOTAL PROTEIN, URINE DIPSTICK: Protein, ur: NEGATIVE mg/dL

## 2019-10-19 MED ORDER — SODIUM CHLORIDE 0.9 % IV SOLN
Freq: Once | INTRAVENOUS | Status: AC
  Filled 2019-10-19: qty 250

## 2019-10-19 MED ORDER — SODIUM CHLORIDE 0.9 % IV SOLN
10.0000 mg/kg | Freq: Once | INTRAVENOUS | Status: AC
  Administered 2019-10-19: 800 mg via INTRAVENOUS
  Filled 2019-10-19: qty 32

## 2019-10-19 NOTE — Patient Instructions (Signed)
[facility] Discharge Instructions for Patients Receiving Chemotherapy  Today you received the following chemotherapy agents Bevacizumab  To help prevent nausea and vomiting after your treatment, we encourage you to take your nausea medication as directed   If you develop nausea and vomiting that is not controlled by your nausea medication, call the clinic.   BELOW ARE SYMPTOMS THAT SHOULD BE REPORTED IMMEDIATELY:  *FEVER GREATER THAN 100.5 F  *CHILLS WITH OR WITHOUT FEVER  NAUSEA AND VOMITING THAT IS NOT CONTROLLED WITH YOUR NAUSEA MEDICATION  *UNUSUAL SHORTNESS OF BREATH  *UNUSUAL BRUISING OR BLEEDING  TENDERNESS IN MOUTH AND THROAT WITH OR WITHOUT PRESENCE OF ULCERS  *URINARY PROBLEMS  *BOWEL PROBLEMS  UNUSUAL RASH Items with * indicate a potential emergency and should be followed up as soon as possible.  Feel free to call the clinic should you have any questions or concerns. The clinic phone number is [phone].  Please show the [place] at check-in to the Emergency Department and triage nurse.    Bevacizumab injection What is this medicine? BEVACIZUMAB (be va SIZ yoo mab) is a monoclonal antibody. It is used to treat many types of cancer. This medicine may be used for other purposes; ask your health care provider or pharmacist if you have questions. COMMON BRAND NAME(S): Avastin, MVASI, Zirabev What should I tell my health care provider before I take this medicine? They need to know if you have any of these conditions:  diabetes  heart disease  high blood pressure  history of coughing up blood  prior anthracycline chemotherapy (e.g., doxorubicin, daunorubicin, epirubicin)  recent or ongoing radiation therapy  recent or planning to have surgery  stroke  an unusual or allergic reaction to bevacizumab, hamster proteins, mouse proteins, other medicines, foods, dyes, or preservatives  pregnant or trying to get  pregnant  breast-feeding How should I use this medicine? This medicine is for infusion into a vein. It is given by a health care professional in a hospital or clinic setting. Talk to your pediatrician regarding the use of this medicine in children. Special care may be needed. Overdosage: If you think you have taken too much of this medicine contact a poison control center or emergency room at once. NOTE: This medicine is only for you. Do not share this medicine with others. What if I miss a dose? It is important not to miss your dose. Call your doctor or health care professional if you are unable to keep an appointment. What may interact with this medicine? Interactions are not expected. This list may not describe all possible interactions. Give your health care provider a list of all the medicines, herbs, non-prescription drugs, or dietary supplements you use. Also tell them if you smoke, drink alcohol, or use illegal drugs. Some items may interact with your medicine. What should I watch for while using this medicine? Your condition will be monitored carefully while you are receiving this medicine. You will need important blood work and urine testing done while you are taking this medicine. This medicine may increase your risk to bruise or bleed. Call your doctor or health care professional if you notice any unusual bleeding. Before having surgery, talk to your health care provider to make sure it is ok. This drug can increase the risk of poor healing of your surgical site or wound. You will need to stop this drug for 28 days before surgery. After surgery, wait at least 28 days before restarting this drug. Make sure  the surgical site or wound is healed enough before restarting this drug. Talk to your health care provider if questions. Do not become pregnant while taking this medicine or for 6 months after stopping it. Women should inform their doctor if they wish to become pregnant or think they  might be pregnant. There is a potential for serious side effects to an unborn child. Talk to your health care professional or pharmacist for more information. Do not breast-feed an infant while taking this medicine and for 6 months after the last dose. This medicine has caused ovarian failure in some women. This medicine may interfere with the ability to have a child. You should talk to your doctor or health care professional if you are concerned about your fertility. What side effects may I notice from receiving this medicine? Side effects that you should report to your doctor or health care professional as soon as possible:  allergic reactions like skin rash, itching or hives, swelling of the face, lips, or tongue  chest pain or chest tightness  chills  coughing up blood  high fever  seizures  severe constipation  signs and symptoms of bleeding such as bloody or black, tarry stools; red or dark-brown urine; spitting up blood or brown material that looks like coffee grounds; red spots on the skin; unusual bruising or bleeding from the eye, gums, or nose  signs and symptoms of a blood clot such as breathing problems; chest pain; severe, sudden headache; pain, swelling, warmth in the leg  signs and symptoms of a stroke like changes in vision; confusion; trouble speaking or understanding; severe headaches; sudden numbness or weakness of the face, arm or leg; trouble walking; dizziness; loss of balance or coordination  stomach pain  sweating  swelling of legs or ankles  vomiting  weight gain Side effects that usually do not require medical attention (report to your doctor or health care professional if they continue or are bothersome):  back pain  changes in taste  decreased appetite  dry skin  nausea  tiredness This list may not describe all possible side effects. Call your doctor for medical advice about side effects. You may report side effects to FDA at  1-800-FDA-1088. Where should I keep my medicine? This drug is given in a hospital or clinic and will not be stored at home. NOTE: This sheet is a summary. It may not cover all possible information. If you have questions about this medicine, talk to your doctor, pharmacist, or health care provider.  2020 Elsevier/Gold Standard (2018-10-29 10:50:46)

## 2019-10-19 NOTE — Progress Notes (Signed)
[facility] [place], [place] [address] [phone]   Interval Evaluation  Date of Service: 10/19/19 Patient Name: Gregory Moreno Patient MRN: [id number] Patient DOB: [birth year] Provider: Ventura Sellers, MD  Identifying Statement:  [name] is a 24 y.o. 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 Zada Finders.   12/02/2018 - 01/13/2019 Radiation Therapy   IMRT and concurrent Temodar 76mm2   02/09/2019 -  Chemotherapy   The patient had temozolomide (TEMODAR) 100 MG capsule, 100 mg (original dose 150 mg/m2/day), Oral, Daily, 1 of 1 cycle, Start date: 10/06/2019, End date: -- Dose modification: 150 mg/m2/day (original dose 150 mg/m2/day, Cycle 1), 200 mg/m2/day (original dose 150 mg/m2/day, Cycle 1), 100 mg (original dose 150 mg/m2/day, Cycle 1)  for chemotherapy treatment.    Glioblastoma ([place]  10/01/2019 Initial Diagnosis   Glioblastoma ([place]   10/19/2019 -  Chemotherapy   The patient had bevacizumab-awwb (MVASI) 800 mg in sodium chloride 0.9 % 100 mL chemo infusion, 10 mg/kg = 800 mg, Intravenous,  Once, 0 of 6 cycles  for chemotherapy treatment.      Biomarkers:  MGMT Methylated.  IDH 1/2 Wild type.  EGFR Not expressed  TERT Unmutated   Interval History:  ADESMON HITCHNERpresents today for follow up for initial avastin infusion. He continues to graduallly become weaker overall, in particular with the left side.  The higher dose steroids (now 449mthree times per day) have helped.  They visited with [facility] last Friday, Dr. LaHali Marry Fatigue remains high and energy level is poor overall.  No further seizures since prior visit.  Remains  partially incontinent of urine, no urgency or lack of sensation in the daytime.  Denies neck or back pain.  Medications: Current Outpatient Medications on File Prior to Visit  Medication Sig Dispense Refill  . camphor-menthol (SARNA) lotion Apply 1 application topically as needed for itching. (Patient not taking: Reported on 09/30/2019) 222 mL 0  . dexamethasone (DECADRON) 4 MG tablet Take 1 tablet (4 mg total) by mouth 3 (three) times daily. 30 tablet 3  . dronabinol (MARINOL) 5 MG capsule Take 1 capsule (5 mg total) by mouth 2 (two) times daily before lunch and supper. 60 capsule 0  . escitalopram (LEXAPRO) 5 MG tablet Take 1 tablet (5 mg total) by mouth at bedtime. 60 tablet 2  . levETIRAcetam (KEPPRA) 250 MG tablet Take 1 tablet (250 mg total) by mouth 2 (two) times daily. 60 tablet 3  . ondansetron (ZOFRAN) 4 MG tablet Take 1 tablet (4 mg total) by mouth every 8 (eight) hours as needed for nausea or vomiting. 60 tablet 3  . temozolomide (TEMODAR) 100 MG capsule Take 1 capsule (100 mg total) by mouth daily. May take on an empty stomach to decrease nausea & vomiting. 30 capsule 0  . [DISCONTINUED] escitalopram (LEXAPRO) 5 MG tablet TAKE 1 TABLET BY MOUTH EVERYDAY AT BEDTIME 30 tablet 0   No current facility-administered medications on file prior to visit.    Allergies: No Known Allergies Past Medical History: No past medical history on file. Past Surgical History:  Past Surgical History:  Procedure Laterality Date  . CRANIOTOMY Left 10/13/2018   Procedure: LEFT CRANIOTOMY FOR TUMOR  RESECTION;  Surgeon: Judith Part, MD;  Location: [facility];  Service: Neurosurgery;  Laterality: Left;   Social History:  Social History   Socioeconomic History  . Marital status: Single    Spouse name: Not on file  . Number of children: Not on file  . Years of education: Not on file  . Highest education level: Not on file  Occupational History  . Not on file  Tobacco Use  . Smoking status: Never  Smoker  . Smokeless tobacco: Never Used  Substance and Sexual Activity  . Alcohol use: Not on file  . Drug use: Not on file  . Sexual activity: Not on file  Other Topics Concern  . Not on file  Social History Narrative  . Not on file   Social Determinants of Health   Financial Resource Strain:   . Difficulty of Paying Living Expenses: Not on file  Food Insecurity:   . Worried About Charity fundraiser in the Last Year: Not on file  . Ran Out of Food in the Last Year: Not on file  Transportation Needs:   . Lack of Transportation (Medical): Not on file  . Lack of Transportation (Non-Medical): Not on file  Physical Activity:   . Days of Exercise per Week: Not on file  . Minutes of Exercise per Session: Not on file  Stress:   . Feeling of Stress : Not on file  Social Connections:   . Frequency of Communication with Friends and Family: Not on file  . Frequency of Social Gatherings with Friends and Family: Not on file  . Attends Religious Services: Not on file  . Active Member of Clubs or Organizations: Not on file  . Attends Archivist Meetings: Not on file  . Marital Status: Not on file  Intimate Partner Violence:   . Fear of Current or Ex-Partner: Not on file  . Emotionally Abused: Not on file  . Physically Abused: Not on file  . Sexually Abused: Not on file   Family History: No family history on file.  Review of Systems: Constitutional: Denies fevers, chills or abnormal weight loss Eyes: Denies blurriness of vision Ears, nose, mouth, throat, and face: Denies mucositis or sore throat Respiratory: Denies cough, dyspnea or wheezes Cardiovascular: Denies palpitation, chest discomfort or lower extremity swelling Gastrointestinal:  Denies nausea, constipation, diarrhea GU: Denies dysuria or incontinence Skin: Denies abnormal skin rashes Neurological: Per HPI Musculoskeletal: Denies joint pain, back or neck discomfort. No decrease in ROM Behavioral/Psych: Denies  anxiety, disturbance in thought content, and mood instability  Physical Exam: Vitals:   10/19/19 1144  BP: (!) 142/88  Pulse: (!) 107  Resp: 18  Temp: 97.8 F (36.6 C)  SpO2: 99%   KPS: 70. General: Alert, cooperative, pleasant, in no acute distress Head: Craniotomy scar noted, dry and intact. EENT: No conjunctival injection or scleral icterus. Oral mucosa moist Lungs: Resp effort normal Cardiac: Regular rate and rhythm Abdomen: Soft, non-distended abdomen Skin: No rashes cyanosis or petechiae. Extremities: No clubbing or edema  Neurologic Exam: Mental Status: Awake, alert, attentive to examiner. Oriented to self and environment. Language demonstrates modest impairments with regards to fluency. Cranial Nerves: Visual acuity is grossly normal. Dense R homonymous hemianopia. Extra-ocular movements intact. No ptosis. Face is symmetric, tongue midline. Motor: Tone and bulk are normal. Power is 2/5 in right arm and leg, now 4/5 on left side. Reflexes are symmetric, no pathologic reflexes present. Sensory: Intact to light touch and temperature Gait: Dystaxic,  hemiparetic  Labs: I have reviewed the data as listed    Component Value Date/Time   NA 144 10/03/2019 0630   K 4.0 10/03/2019 0630   CL 109 10/03/2019 0630   CO2 26 10/03/2019 0630   GLUCOSE 109 (H) 10/03/2019 0630   BUN 22 (H) 10/03/2019 0630   CREATININE 0.66 10/03/2019 0630   CREATININE 0.88 08/24/2019 0912   CALCIUM 9.2 10/03/2019 0630   PROT 8.1 09/30/2019 1617   ALBUMIN 4.1 09/30/2019 1617   AST 18 09/30/2019 1617   AST 11 (L) 08/24/2019 0912   ALT 25 09/30/2019 1617   ALT 15 08/24/2019 0912   ALKPHOS 73 09/30/2019 1617   BILITOT 0.7 09/30/2019 1617   BILITOT 0.4 08/24/2019 0912   GFRNONAA >60 10/03/2019 0630   GFRNONAA >60 08/24/2019 0912   GFRAA >60 10/03/2019 0630   GFRAA >60 08/24/2019 0912   Lab Results  Component Value Date   WBC 14.4 (H) 10/19/2019   NEUTROABS 12.5 (H) 10/19/2019   HGB 15.8  10/19/2019   HCT 48.2 10/19/2019   MCV 91.5 10/19/2019   PLT 139 (L) 10/19/2019    Assessment/Plan 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  His labs are within normal limits.  He is cleared to dose cycle #1 of Avastin today, 9m/kg IV q2 weeks.  He will also continue with the daily metronomic Temodar 5142mm2.    The patient will have a complete blood count performed on days 14 and 28 of each cycle, and a comprehensive metabolic panel performed on day 14 and 28 of each cycle. Labs may need to be performed more often.   Chemotherapy should be held for the following:  ANC less than 1,000  Platelets less than 100,000  LFT or creatinine greater than 2x ULN  If clinical concerns/contraindications develop    Avastin should be held for the following:  ANC less than 500  Platelets less than 50,000  LFT or creatinine greater than 2x ULN  If clinical concerns/contraindications develop    For N/V will continue Zofran 42m63mRN, and Decadron will reduce to 4mg29mD x3 days, then 4mg 84mly thereafter.    Keppra will con't at 250mg 97m  Will have social work reach out to them for recommendations on in-home support services.  We ask that Byrl Blenda Mountsn to clinic in 2 weeks for next Avastin infusion.  All questions were answered. The patient knows to call the clinic with any problems, questions or concerns. No barriers to learning were detected.  I have spent a total of 30 minutes of face-to-face and non-face-to-face time, excluding clinical staff time, preparing to see patient, ordering tests and/or medications, counseling the patient, and independently interpreting results and communicating results to the patient/family/caregiver   ZacharVentura Sellersedical Director of Neuro-Oncology [facility]/21 11:41 AM

## 2019-10-20 ENCOUNTER — Telehealth: Payer: Self-pay | Admitting: Internal Medicine

## 2019-10-20 NOTE — Telephone Encounter (Signed)
Scheduled per 10/4 los. Unable to reach pt. Left voicemail with appt times and dates. 

## 2019-10-21 ENCOUNTER — Telehealth: Payer: Self-pay | Admitting: *Deleted

## 2019-10-21 ENCOUNTER — Other Ambulatory Visit: Payer: Self-pay | Admitting: *Deleted

## 2019-10-21 ENCOUNTER — Encounter: Payer: 59 | Admitting: Speech Pathology

## 2019-10-21 DIAGNOSIS — C712 Malignant neoplasm of temporal lobe: Secondary | ICD-10-CM

## 2019-10-21 NOTE — Progress Notes (Addendum)
[facility] Physical therapist (w/[employer] [phone]) evaluated patient.  Need hoyer lift with sling with blue netted sling with color coded nylon straps.  A reclining back wheelchair with adjustable long back, trunk & head support.     Request called to [employer]

## 2019-10-21 NOTE — Telephone Encounter (Signed)
[place] Work  Clinical Social Work was referred by Insurance underwriter for assessment of psychosocial needs.  Clinical Social Worker contacted caregiver by phone  to offer support and assess for needs.  CSW spoke with patient's mother, Mrs. Gombos.  She shared brief overview of patient's cancer experience and current functional status of patient.   CSW and patient's mother discussed concept of hope and waiting for a miracle and also coping with current needs.  Patient's father is currently out of work on leave and patient's mother owns her own business and has flexible schedule.  She reported it is getting more difficult to lift and move patient who is now "total care" per mother's report. She shared patient's PT, Suezanne Jacquet, has recommended equipment to help assist. CSW spoke with PT Suezanne Jacquet with [facility], he requested MD place order for hoyer lift and reclining long back wheelchair.  CSW notified neuro-oncology RN.  CSW provided information support programs and CSW role.  CSW normalized caregiver's need for emotional support/counseling throughout treatment experience- encouraged patient's mother to call as needed.   Gwinda Maine, LCSW  Clinical Social Worker [facility]

## 2019-10-27 ENCOUNTER — Telehealth: Payer: Self-pay

## 2019-10-27 NOTE — Telephone Encounter (Signed)
Oral Oncology Patient Advocate Encounter  Received notification from [facility] that prior authorization for Temodar is required.  PA submitted by fax Status is pending  Oral Oncology Clinic will continue to follow.  [place] Patient [place] Phone [phone] Fax [phone] 10/27/2019 10:15 AM

## 2019-10-27 NOTE — Telephone Encounter (Signed)
Oral Oncology Patient Advocate Encounter  Prior Authorization for Temodar has been approved.    Effective dates: 10/26/19 through 10/25/20  Patient must fill at [facility] will continue to follow.   [place] Patient [place] Phone [phone] Fax [phone] 10/27/2019 10:16 AM

## 2019-11-02 ENCOUNTER — Ambulatory Visit: Payer: 59

## 2019-11-02 ENCOUNTER — Other Ambulatory Visit: Payer: Self-pay

## 2019-11-02 ENCOUNTER — Inpatient Hospital Stay: Payer: 59

## 2019-11-02 ENCOUNTER — Inpatient Hospital Stay: Payer: 59 | Admitting: Internal Medicine

## 2019-11-02 VITALS — BP 118/88 | HR 121 | Temp 97.8°F | Resp 18 | Ht 69.0 in

## 2019-11-02 VITALS — BP 102/60 | HR 79 | Temp 98.4°F | Resp 20 | Wt 174.5 lb

## 2019-11-02 DIAGNOSIS — C719 Malignant neoplasm of brain, unspecified: Secondary | ICD-10-CM

## 2019-11-02 DIAGNOSIS — Z23 Encounter for immunization: Secondary | ICD-10-CM

## 2019-11-02 DIAGNOSIS — C712 Malignant neoplasm of temporal lobe: Secondary | ICD-10-CM | POA: Diagnosis not present

## 2019-11-02 DIAGNOSIS — Z5112 Encounter for antineoplastic immunotherapy: Secondary | ICD-10-CM | POA: Diagnosis not present

## 2019-11-02 LAB — CMP (CANCER CENTER ONLY)
ALT: 31 U/L (ref 0–44)
AST: 14 U/L — ABNORMAL LOW (ref 15–41)
Albumin: 3.4 g/dL — ABNORMAL LOW (ref 3.5–5.0)
Alkaline Phosphatase: 61 U/L (ref 38–126)
Anion gap: 8 (ref 5–15)
BUN: 14 mg/dL (ref 6–20)
CO2: 27 mmol/L (ref 22–32)
Calcium: 9.6 mg/dL (ref 8.9–10.3)
Chloride: 106 mmol/L (ref 98–111)
Creatinine: 0.71 mg/dL (ref 0.61–1.24)
GFR, Estimated: >60 mL/min (ref >60)
Glucose, Bld: 137 mg/dL — ABNORMAL HIGH (ref 70–99)
Potassium: 4.3 mmol/L (ref 3.5–5.1)
Sodium: 141 mmol/L (ref 135–145)
Total Bilirubin: 0.3 mg/dL (ref 0.3–1.2)
Total Protein: 7.1 g/dL (ref 6.5–8.1)

## 2019-11-02 LAB — CBC WITH DIFFERENTIAL (CANCER CENTER ONLY)
Abs Immature Granulocytes: 0.17 10*3/uL — ABNORMAL HIGH (ref 0.00–0.07)
Basophils Absolute: 0.1 10*3/uL (ref 0.0–0.1)
Basophils Relative: 1 %
Eosinophils Absolute: 0.1 10*3/uL (ref 0.0–0.5)
Eosinophils Relative: 1 %
HCT: 47.8 % (ref 39.0–52.0)
Hemoglobin: 15.7 g/dL (ref 13.0–17.0)
Immature Granulocytes: 2 %
Lymphocytes Relative: 6 %
Lymphs Abs: 0.5 10*3/uL — ABNORMAL LOW (ref 0.7–4.0)
MCH: 29.5 pg (ref 26.0–34.0)
MCHC: 32.8 g/dL (ref 30.0–36.0)
MCV: 89.7 fL (ref 80.0–100.0)
Monocytes Absolute: 0.2 10*3/uL (ref 0.1–1.0)
Monocytes Relative: 3 %
Neutro Abs: 7.2 10*3/uL (ref 1.7–7.7)
Neutrophils Relative %: 87 %
Platelet Count: 126 10*3/uL — ABNORMAL LOW (ref 150–400)
RBC: 5.33 MIL/uL (ref 4.22–5.81)
RDW: 16.4 % — ABNORMAL HIGH (ref 11.5–15.5)
WBC Count: 8.3 10*3/uL (ref 4.0–10.5)
nRBC: 0 % (ref 0.0–0.2)

## 2019-11-02 MED ORDER — TEMOZOLOMIDE 100 MG PO CAPS: 100.0000 mg | ORAL_CAPSULE | Freq: Every day | ORAL | 0 refills | Status: DC

## 2019-11-02 MED ORDER — ONDANSETRON HCL 4 MG PO TABS: 4.0000 mg | ORAL_TABLET | Freq: Three times a day (TID) | ORAL | 3 refills | Status: DC | PRN

## 2019-11-02 MED ORDER — DEXAMETHASONE 4 MG PO TABS
4.0000 mg | ORAL_TABLET | Freq: Every day | ORAL | 1 refills | Status: DC
Start: 2019-11-02 — End: 2019-11-26

## 2019-11-02 MED ORDER — SODIUM CHLORIDE 0.9 % IV SOLN
10.0000 mg/kg | Freq: Once | INTRAVENOUS | Status: AC
  Administered 2019-11-02: 800 mg via INTRAVENOUS
  Filled 2019-11-02: qty 32

## 2019-11-02 MED ORDER — SODIUM CHLORIDE 0.9 % IV SOLN
Freq: Once | INTRAVENOUS | Status: AC
  Filled 2019-11-02: qty 250

## 2019-11-02 MED ORDER — INFLUENZA VAC SPLIT QUAD 0.5 ML IM SUSY
PREFILLED_SYRINGE | INTRAMUSCULAR | Status: AC
  Filled 2019-11-02: qty 0.5

## 2019-11-02 MED ORDER — INFLUENZA VAC SPLIT QUAD 0.5 ML IM SUSY
0.5000 mL | PREFILLED_SYRINGE | Freq: Once | INTRAMUSCULAR | Status: AC
  Administered 2019-11-02: 0.5 mL via INTRAMUSCULAR

## 2019-11-02 NOTE — Patient Instructions (Signed)
[facility] Discharge Instructions for Patients Receiving Chemotherapy  Today you received the following chemotherapy agents Bevacizumab  To help prevent nausea and vomiting after your treatment, we encourage you to take your nausea medication as directed   If you develop nausea and vomiting that is not controlled by your nausea medication, call the clinic.   BELOW ARE SYMPTOMS THAT SHOULD BE REPORTED IMMEDIATELY:  *FEVER GREATER THAN 100.5 F  *CHILLS WITH OR WITHOUT FEVER  NAUSEA AND VOMITING THAT IS NOT CONTROLLED WITH YOUR NAUSEA MEDICATION  *UNUSUAL SHORTNESS OF BREATH  *UNUSUAL BRUISING OR BLEEDING  TENDERNESS IN MOUTH AND THROAT WITH OR WITHOUT PRESENCE OF ULCERS  *URINARY PROBLEMS  *BOWEL PROBLEMS  UNUSUAL RASH Items with * indicate a potential emergency and should be followed up as soon as possible.  Feel free to call the clinic should you have any questions or concerns. The clinic phone number is [phone].  Please show the CHEMO ALERT CARD at check-in to the Emergency Department and triage nurse.   

## 2019-11-02 NOTE — Progress Notes (Signed)
[facility] at [place], [place] [address] [phone]   Interval Evaluation  Date of Service: 11/02/19 Patient Name: Gregory Moreno Patient MRN: [id number] Patient DOB: 09-May-1995 Provider: Ventura Sellers, MD  Identifying Statement:  Gregory Moreno is a 24 y.o. male with left temporal glioblastoma   Referring Provider: [facility] [address] [place],  [place] [address]  Oncologic History: Oncology History  Glioblastoma multiforme of temporal lobe ([facility])  10/13/2018 Surgery   Craniotomy, debulking resection and hematoma evacuation with Dr. Zada Finders.   12/02/2018 - 01/13/2019 Radiation Therapy   IMRT and concurrent Temodar 36mm2   02/09/2019 -  Chemotherapy   The patient had temozolomide (TEMODAR) 100 MG capsule, 100 mg (original dose 150 mg/m2/day), Oral, Daily, 1 of 1 cycle, Start date: 10/06/2019, End date: -- Dose modification: 150 mg/m2/day (original dose 150 mg/m2/day, Cycle 1), 200 mg/m2/day (original dose 150 mg/m2/day, Cycle 1), 100 mg (original dose 150 mg/m2/day, Cycle 1)  for chemotherapy treatment.    [facility]lioblastoma ([facility]  10/01/2019 Initial Diagnosis   Glioblastoma ([facility]   10/19/2019 -  Chemotherapy   The patient had bevacizumab-awwb (MVASI) 800 mg in sodium chloride 0.9 % 100 mL chemo infusion, 10 mg/kg = 800 mg, Intravenous,  Once, 1 of 6 cycles Administration: 800 mg (10/19/2019)  for chemotherapy treatment.      Biomarkers:  MGMT Methylated.  IDH 1/2 Wild type.  EGFR Not expressed  TERT Unmutated   Interval History:  Gregory MANGOLDpresents today for follow up for second avastin infusion.  Per father, he continues to experience progressive weakness and dysfunction.  Requiring help with all of his ADLs.  No longer seeing clear benefit from decadron. Fatigue remains high and energy level is poor overall.  No further seizures since prior visit.  Remains partially  incontinent of urine, no urgency or lack of sensation in the daytime.  Denies neck or back pain.  Medications: Current Outpatient Medications on File Prior to Visit  Medication Sig Dispense Refill  . camphor-menthol (SARNA) lotion Apply 1 application topically as needed for itching. (Patient not taking: Reported on 09/30/2019) 222 mL 0  . dexamethasone (DECADRON) 4 MG tablet Take 1 tablet (4 mg total) by mouth 3 (three) times daily. 30 tablet 3  . dronabinol (MARINOL) 5 MG capsule Take 1 capsule (5 mg total) by mouth 2 (two) times daily before lunch and supper. 60 capsule 0  . escitalopram (LEXAPRO) 5 MG tablet Take 1 tablet (5 mg total) by mouth at bedtime. 60 tablet 2  . levETIRAcetam (KEPPRA) 250 MG tablet Take 1 tablet (250 mg total) by mouth 2 (two) times daily. 60 tablet 3  . ondansetron (ZOFRAN) 4 MG tablet Take 1 tablet (4 mg total) by mouth every 8 (eight) hours as needed for nausea or vomiting. 60 tablet 3  . temozolomide (TEMODAR) 100 MG capsule Take 1 capsule (100 mg total) by mouth daily. May take on an empty stomach to decrease nausea & vomiting. 30 capsule 0  . [DISCONTINUED] escitalopram (LEXAPRO) 5 MG tablet TAKE 1 TABLET BY MOUTH EVERYDAY AT BEDTIME 30 tablet 0   No current facility-administered medications on file prior to visit.    Allergies: No Known Allergies Past Medical History: No past medical history on file. Past Surgical History:  Past Surgical History:  Procedure Laterality Date  . CRANIOTOMY Left 10/13/2018   Procedure: LEFT CRANIOTOMY FOR TUMOR RESECTION;  Surgeon: OJudith Part  MD;  Location: [facility];  Service: Neurosurgery;  Laterality: Left;   Social History:  Social History   Socioeconomic History  . Marital status: Single    Spouse name: Not on file  . Number of children: Not on file  . Years of education: Not on file  . Highest education level: Not on file  Occupational History  . Not on file  Tobacco Use  . Smoking status: Never Smoker  .  Smokeless tobacco: Never Used  Substance and Sexual Activity  . Alcohol use: Not on file  . Drug use: Not on file  . Sexual activity: Not on file  Other Topics Concern  . Not on file  Social History Narrative  . Not on file   Social Determinants of Health   Financial Resource Strain:   . Difficulty of Paying Living Expenses: Not on file  Food Insecurity:   . Worried About Charity fundraiser in the Last Year: Not on file  . Ran Out of Food in the Last Year: Not on file  Transportation Needs:   . Lack of Transportation (Medical): Not on file  . Lack of Transportation (Non-Medical): Not on file  Physical Activity:   . Days of Exercise per Week: Not on file  . Minutes of Exercise per Session: Not on file  Stress:   . Feeling of Stress : Not on file  Social Connections:   . Frequency of Communication with Friends and Family: Not on file  . Frequency of Social Gatherings with Friends and Family: Not on file  . Attends Religious Services: Not on file  . Active Member of Clubs or Organizations: Not on file  . Attends Archivist Meetings: Not on file  . Marital Status: Not on file  Intimate Partner Violence:   . Fear of Current or Ex-Partner: Not on file  . Emotionally Abused: Not on file  . Physically Abused: Not on file  . Sexually Abused: Not on file   Family History: No family history on file.  Review of Systems: Constitutional: Denies fevers, chills or abnormal weight loss Eyes: Denies blurriness of vision Ears, nose, mouth, throat, and face: Denies mucositis or sore throat Respiratory: Denies cough, dyspnea or wheezes Cardiovascular: Denies palpitation, chest discomfort or lower extremity swelling Gastrointestinal:  Denies nausea, constipation, diarrhea GU: Denies dysuria or incontinence Skin: Denies abnormal skin rashes Neurological: Per HPI Musculoskeletal: Denies joint pain, back or neck discomfort. No decrease in ROM Behavioral/Psych: Denies anxiety,  disturbance in thought content, and mood instability  Physical Exam: Vitals:   11/02/19 1046  BP: 118/88  Pulse: (!) 121  Resp: 18  Temp: 97.8 F (36.6 C)  SpO2: 99%   KPS: 50. General: Alert, cooperative, pleasant, in no acute distress Head: Craniotomy scar noted, dry and intact. EENT: No conjunctival injection or scleral icterus. Oral mucosa moist Lungs: Resp effort normal Cardiac: Regular rate and rhythm Abdomen: Soft, non-distended abdomen Skin: No rashes cyanosis or petechiae. Extremities: No clubbing or edema  Neurologic Exam: Mental Status: Awake, alert, attentive to examiner. Oriented to self and environment. Language densely impaired with regards to fluency and comprehension. Cranial Nerves: Visual acuity is grossly normal. Dense R homonymous hemianopia. Extra-ocular movements intact. No ptosis. Face is symmetric, tongue midline. Motor: Tone and bulk are normal. Power is 2/5 in right arm and leg, now 4/5 on left side. Reflexes are symmetric, no pathologic reflexes present. Sensory: Intact to light touch and temperature Gait: Non ambulatory  Labs: I have reviewed  the data as listed    Component Value Date/Time   NA 141 10/19/2019 1108   K 4.1 10/19/2019 1108   CL 104 10/19/2019 1108   CO2 28 10/19/2019 1108   GLUCOSE 111 (H) 10/19/2019 1108   BUN 24 (H) 10/19/2019 1108   CREATININE 0.71 10/19/2019 1108   CALCIUM 9.4 10/19/2019 1108   PROT 7.1 10/19/2019 1108   ALBUMIN 3.4 (L) 10/19/2019 1108   AST 15 10/19/2019 1108   ALT 46 (H) 10/19/2019 1108   ALKPHOS 61 10/19/2019 1108   BILITOT 0.4 10/19/2019 1108   GFRNONAA >60 10/19/2019 1108   GFRAA >60 10/19/2019 1108   Lab Results  Component Value Date   WBC 8.3 11/02/2019   NEUTROABS 7.2 11/02/2019   HGB 15.7 11/02/2019   HCT 47.8 11/02/2019   MCV 89.7 11/02/2019   PLT 126 (L) 11/02/2019    Assessment/Plan Glioblastoma multiforme of temporal lobe ([facility]) [C71.2]   Gregory Moreno is clinically stable  today.  His labs are within normal limits.  He is cleared to dose cycle #2 of Avastin today, 187m/kg IV q2 weeks.  He will also continue with the daily metronomic Temodar 5687mm2.  Per [facility] team and drug company, Avapritinib will need to defer until Avastin is washed out (3 weeks).  We will continue current regimen until progression/failure on imaging.  The patient will have a complete blood count performed on days 14 and 28 of each cycle, and a comprehensive metabolic panel performed on day 14 and 28 of each cycle. Labs may need to be performed more often.   Chemotherapy should be held for the following:  ANC less than 1,000  Platelets less than 100,000  LFT or creatinine greater than 2x ULN  If clinical concerns/contraindications develop    Avastin should be held for the following:  ANC less than 500  Platelets less than 50,000  LFT or creatinine greater than 2x ULN  If clinical concerns/contraindications develop    For N/V will continue Zofran 87m36mRN, and Decadron will con't 4mg61mily.    Keppra will con't at 250mg44m.  Family will continue to search for in-home support services, resouces.  They have been in touch with [facility] ask that AllenBlenda Mountsrn to clinic in 2 weeks for next Avastin infusion.  Next MRI brain will be in 4 weeks.  All questions were answered. The patient knows to call the clinic with any problems, questions or concerns. No barriers to learning were detected.  I have spent a total of 30 minutes of face-to-face and non-face-to-face time, excluding clinical staff time, preparing to see patient, ordering tests and/or medications, counseling the patient, and independently interpreting results and communicating results to the patient/family/caregiver   ZachaVentura SellersMedical Director of Neuro-Oncology [facility]8/21 10:50 AM

## 2019-11-03 ENCOUNTER — Other Ambulatory Visit: Payer: Self-pay | Admitting: Internal Medicine

## 2019-11-03 ENCOUNTER — Telehealth: Payer: Self-pay | Admitting: Medical Oncology

## 2019-11-03 ENCOUNTER — Telehealth: Payer: Self-pay | Admitting: Internal Medicine

## 2019-11-03 DIAGNOSIS — C719 Malignant neoplasm of brain, unspecified: Secondary | ICD-10-CM

## 2019-11-03 NOTE — Telephone Encounter (Signed)
GI does not have the equipment to lift pt onto scan table. Please reorder to be done at [facility] or [facility].  Dr [name] notified. Mother notified.

## 2019-11-03 NOTE — Telephone Encounter (Signed)
Scheduled per 10/18 los. Spoke with pt's mother and is aware of appt times and date.

## 2019-11-04 ENCOUNTER — Telehealth: Payer: Self-pay | Admitting: *Deleted

## 2019-11-04 NOTE — Telephone Encounter (Signed)
Called to inquire if the temozolomide is on/off cycle or continuous. Informed them it is continuous.

## 2019-11-12 ENCOUNTER — Telehealth: Payer: Self-pay

## 2019-11-12 NOTE — Telephone Encounter (Signed)
Ms. Gregory Moreno LM to report the following:  1. Pts BP was a little elevated at 120/106 today.  2. PT is now complete and pt has been discharged. 3. Mother indicated pt vomited once 11/11/19 and had some involuntary movement but it cleared up. 4. Mr. Gregory Moreno observation of the pt is he appeared to be the "normal AJ" when he saw him.  Please advise if there is any action I need to take at this time.

## 2019-11-16 ENCOUNTER — Other Ambulatory Visit: Payer: Self-pay

## 2019-11-16 ENCOUNTER — Inpatient Hospital Stay (HOSPITAL_BASED_OUTPATIENT_CLINIC_OR_DEPARTMENT_OTHER): Payer: 59 | Admitting: Internal Medicine

## 2019-11-16 ENCOUNTER — Inpatient Hospital Stay: Payer: 59

## 2019-11-16 ENCOUNTER — Inpatient Hospital Stay: Payer: 59 | Attending: Internal Medicine

## 2019-11-16 VITALS — BP 126/92 | HR 71 | Temp 97.4°F | Resp 18 | Ht 69.0 in

## 2019-11-16 DIAGNOSIS — Z79899 Other long term (current) drug therapy: Secondary | ICD-10-CM | POA: Diagnosis not present

## 2019-11-16 DIAGNOSIS — G8110 Spastic hemiplegia affecting unspecified side: Secondary | ICD-10-CM

## 2019-11-16 DIAGNOSIS — Z5112 Encounter for antineoplastic immunotherapy: Secondary | ICD-10-CM | POA: Diagnosis present

## 2019-11-16 DIAGNOSIS — R569 Unspecified convulsions: Secondary | ICD-10-CM

## 2019-11-16 DIAGNOSIS — C712 Malignant neoplasm of temporal lobe: Secondary | ICD-10-CM

## 2019-11-16 DIAGNOSIS — C719 Malignant neoplasm of brain, unspecified: Secondary | ICD-10-CM

## 2019-11-16 LAB — CBC WITH DIFFERENTIAL (CANCER CENTER ONLY)
Abs Immature Granulocytes: 0.46 10*3/uL — ABNORMAL HIGH (ref 0.00–0.07)
Basophils Absolute: 0.1 10*3/uL (ref 0.0–0.1)
Basophils Relative: 1 %
Eosinophils Absolute: 0 10*3/uL (ref 0.0–0.5)
Eosinophils Relative: 0 %
HCT: 47.3 % (ref 39.0–52.0)
Hemoglobin: 16 g/dL (ref 13.0–17.0)
Immature Granulocytes: 5 %
Lymphocytes Relative: 6 %
Lymphs Abs: 0.6 10*3/uL — ABNORMAL LOW (ref 0.7–4.0)
MCH: 30.6 pg (ref 26.0–34.0)
MCHC: 33.8 g/dL (ref 30.0–36.0)
MCV: 90.4 fL (ref 80.0–100.0)
Monocytes Absolute: 0.5 10*3/uL (ref 0.1–1.0)
Monocytes Relative: 5 %
Neutro Abs: 8.7 10*3/uL — ABNORMAL HIGH (ref 1.7–7.7)
Neutrophils Relative %: 83 %
Platelet Count: 145 10*3/uL — ABNORMAL LOW (ref 150–400)
RBC: 5.23 MIL/uL (ref 4.22–5.81)
RDW: 15.9 % — ABNORMAL HIGH (ref 11.5–15.5)
WBC Count: 10.3 10*3/uL (ref 4.0–10.5)
nRBC: 0 % (ref 0.0–0.2)

## 2019-11-16 LAB — CMP (CANCER CENTER ONLY)
ALT: 23 U/L (ref 0–44)
AST: 15 U/L (ref 15–41)
Albumin: 3.8 g/dL (ref 3.5–5.0)
Alkaline Phosphatase: 55 U/L (ref 38–126)
Anion gap: 10 (ref 5–15)
BUN: 18 mg/dL (ref 6–20)
CO2: 27 mmol/L (ref 22–32)
Calcium: 9.7 mg/dL (ref 8.9–10.3)
Chloride: 103 mmol/L (ref 98–111)
Creatinine: 0.72 mg/dL (ref 0.61–1.24)
GFR, Estimated: >60 mL/min (ref >60)
Glucose, Bld: 97 mg/dL (ref 70–99)
Potassium: 3.8 mmol/L (ref 3.5–5.1)
Sodium: 140 mmol/L (ref 135–145)
Total Bilirubin: 0.4 mg/dL (ref 0.3–1.2)
Total Protein: 7.3 g/dL (ref 6.5–8.1)

## 2019-11-16 LAB — TOTAL PROTEIN, URINE DIPSTICK: Protein, ur: NEGATIVE mg/dL

## 2019-11-16 MED ORDER — TRAMADOL HCL 50 MG PO TABS: 50.0000 mg | ORAL_TABLET | Freq: Four times a day (QID) | ORAL | 2 refills | Status: DC | PRN

## 2019-11-16 MED ORDER — SODIUM CHLORIDE 0.9 % IV SOLN
10.0000 mg/kg | Freq: Once | INTRAVENOUS | Status: AC
  Administered 2019-11-16: 800 mg via INTRAVENOUS
  Filled 2019-11-16: qty 32

## 2019-11-16 MED ORDER — PANTOPRAZOLE SODIUM 40 MG PO TBEC: 40.0000 mg | DELAYED_RELEASE_TABLET | Freq: Every day | ORAL | 3 refills | Status: DC

## 2019-11-16 MED ORDER — SODIUM CHLORIDE 0.9 % IV SOLN
Freq: Once | INTRAVENOUS | Status: AC
  Filled 2019-11-16: qty 250

## 2019-11-16 NOTE — Patient Instructions (Signed)
[facility] Discharge Instructions for Patients Receiving Chemotherapy  Today you received the following chemotherapy agents: bevacizumab  To help prevent nausea and vomiting after your treatment, we encourage you to take your nausea medication as directed.   If you develop nausea and vomiting that is not controlled by your nausea medication, call the clinic.   BELOW ARE SYMPTOMS THAT SHOULD BE REPORTED IMMEDIATELY:  *FEVER GREATER THAN 100.5 F  *CHILLS WITH OR WITHOUT FEVER  NAUSEA AND VOMITING THAT IS NOT CONTROLLED WITH YOUR NAUSEA MEDICATION  *UNUSUAL SHORTNESS OF BREATH  *UNUSUAL BRUISING OR BLEEDING  TENDERNESS IN MOUTH AND THROAT WITH OR WITHOUT PRESENCE OF ULCERS  *URINARY PROBLEMS  *BOWEL PROBLEMS  UNUSUAL RASH Items with * indicate a potential emergency and should be followed up as soon as possible.  Feel free to call the clinic should you have any questions or concerns. The clinic phone number is [phone].  Please show the CHEMO ALERT CARD at check-in to the Emergency Department and triage nurse.   

## 2019-11-16 NOTE — Progress Notes (Signed)
[facility] at [place], [place] [address] [phone]   Interval Evaluation  Date of Service: 11/16/19 Patient Name: ALRICK CUBBAGE Patient MRN: [id number] Patient DOB: January 24, 1995 Provider: Ventura Sellers, MD  Identifying Statement:  [name] is a 24 y.o. male with left temporal glioblastoma   Referring Provider: [facility] [address] [place],  [place] [address]  Oncologic History: Oncology History  Glioblastoma multiforme of temporal lobe ([place])  10/13/2018 Surgery   Craniotomy, debulking resection and hematoma evacuation with Dr. Zada Finders.   12/02/2018 - 01/13/2019 Radiation Therapy   IMRT and concurrent Temodar 91mm2   02/09/2019 -  Chemotherapy   The patient had temozolomide (TEMODAR) 100 MG capsule, 100 mg (original dose 150 mg/m2/day), Oral, Daily, 1 of 1 cycle, Start date: 10/06/2019, End date: -- Dose modification: 150 mg/m2/day (original dose 150 mg/m2/day, Cycle 1), 200 mg/m2/day (original dose 150 mg/m2/day, Cycle 1), 100 mg (original dose 150 mg/m2/day, Cycle 1)  for chemotherapy treatment.    Glioblastoma ([facility]  10/01/2019 Initial Diagnosis   Glioblastoma ([facility]   10/19/2019 -  Chemotherapy   The patient had bevacizumab-awwb (MVASI) 800 mg in sodium chloride 0.9 % 100 mL chemo infusion, 10 mg/kg = 800 mg, Intravenous,  Once, 2 of 6 cycles Administration: 800 mg (10/19/2019), 800 mg (11/02/2019)  for chemotherapy treatment.      Biomarkers:  MGMT Methylated.  IDH 1/2 Wild type.  EGFR Not expressed  TERT Unmutated   Interval History:  AALBI RAPPAPORTpresents today for follow up for third avastin infusion.  Overall stability of weakness and dysfunction.  Is full assist with all ADLs, confined fully to wheelchair.  Seems to be having abdominal pain after eating, lasts only several minutes.  As prior, fatigue remains high and energy level is poor overall.  No further  seizures since prior visit.  He is communicating minimally.  Medications: Current Outpatient Medications on File Prior to Visit  Medication Sig Dispense Refill  . Baclofen 5 MG TABS Take 1 tablet by mouth 2 (two) times daily as needed.    . camphor-menthol (SARNA) lotion Apply 1 application topically as needed for itching. 222 mL 0  . dexamethasone (DECADRON) 4 MG tablet Take 1 tablet (4 mg total) by mouth daily. 60 tablet 1  . escitalopram (LEXAPRO) 5 MG tablet Take 1 tablet (5 mg total) by mouth at bedtime. 60 tablet 2  . levETIRAcetam (KEPPRA) 250 MG tablet Take 1 tablet (250 mg total) by mouth 2 (two) times daily. 60 tablet 3  . ondansetron (ZOFRAN) 4 MG tablet Take 1 tablet (4 mg total) by mouth every 8 (eight) hours as needed for nausea or vomiting. 60 tablet 3  . senna (SENOKOT) 8.6 MG tablet Take 2 tablets by mouth daily.    .Marland Kitchentemozolomide (TEMODAR) 100 MG capsule Take 1 capsule (100 mg total) by mouth daily. May take on an empty stomach to decrease nausea & vomiting. 30 capsule 0  . dronabinol (MARINOL) 5 MG capsule Take 1 capsule (5 mg total) by mouth 2 (two) times daily before lunch and supper. 60 capsule 0  . [DISCONTINUED] escitalopram (LEXAPRO) 5 MG tablet TAKE 1 TABLET BY MOUTH EVERYDAY AT BEDTIME 30 tablet 0   No current facility-administered medications on file prior to visit.    Allergies: No Known Allergies Past Medical History: No past medical history on file. Past Surgical History:  Past Surgical History:  Procedure Laterality  Date  . CRANIOTOMY Left 10/13/2018   Procedure: LEFT CRANIOTOMY FOR TUMOR RESECTION;  Surgeon: Judith Part, MD;  Location: [facility];  Service: Neurosurgery;  Laterality: Left;   Social History:  Social History   Socioeconomic History  . Marital status: Single    Spouse name: Not on file  . Number of children: Not on file  . Years of education: Not on file  . Highest education level: Not on file  Occupational History  . Not on file    Tobacco Use  . Smoking status: Never Smoker  . Smokeless tobacco: Never Used  Substance and Sexual Activity  . Alcohol use: Not on file  . Drug use: Not on file  . Sexual activity: Not on file  Other Topics Concern  . Not on file  Social History Narrative  . Not on file   Social Determinants of Health   Financial Resource Strain:   . Difficulty of Paying Living Expenses: Not on file  Food Insecurity:   . Worried About Charity fundraiser in the Last Year: Not on file  . Ran Out of Food in the Last Year: Not on file  Transportation Needs:   . Lack of Transportation (Medical): Not on file  . Lack of Transportation (Non-Medical): Not on file  Physical Activity:   . Days of Exercise per Week: Not on file  . Minutes of Exercise per Session: Not on file  Stress:   . Feeling of Stress : Not on file  Social Connections:   . Frequency of Communication with Friends and Family: Not on file  . Frequency of Social Gatherings with Friends and Family: Not on file  . Attends Religious Services: Not on file  . Active Member of Clubs or Organizations: Not on file  . Attends Archivist Meetings: Not on file  . Marital Status: Not on file  Intimate Partner Violence:   . Fear of Current or Ex-Partner: Not on file  . Emotionally Abused: Not on file  . Physically Abused: Not on file  . Sexually Abused: Not on file   Family History: No family history on file.  Review of Systems: Constitutional: Denies fevers, chills or abnormal weight loss Eyes: Denies blurriness of vision Ears, nose, mouth, throat, and face: Denies mucositis or sore throat Respiratory: Denies cough, dyspnea or wheezes Cardiovascular: Denies palpitation, chest discomfort or lower extremity swelling Gastrointestinal:  Denies nausea, constipation, diarrhea GU: Denies dysuria or incontinence Skin: Denies abnormal skin rashes Neurological: Per HPI Musculoskeletal: Denies joint pain, back or neck discomfort. No  decrease in ROM Behavioral/Psych: Denies anxiety, disturbance in thought content, and mood instability  Physical Exam: Vitals:   11/16/19 1217  BP: (!) 126/92  Pulse: 71  Resp: 18  Temp: (!) 97.4 F (36.3 C)  SpO2: 99%   KPS: 50. General: Alert, cooperative, pleasant, in no acute distress Head: Craniotomy scar noted, dry and intact. EENT: No conjunctival injection or scleral icterus. Oral mucosa moist Lungs: Resp effort normal Cardiac: Regular rate and rhythm Abdomen: Soft, non-distended abdomen Skin: No rashes cyanosis or petechiae. Extremities: No clubbing or edema  Neurologic Exam: Mental Status: Awake, alert, attentive to examiner. Oriented to self and environment. Language densely impaired with regards to fluency and comprehension. Cranial Nerves: Visual acuity is grossly normal. Dense R homonymous hemianopia. Extra-ocular movements intact. L eye ptotic. Face is symmetric, tongue midline. Motor: Tone and bulk are normal. Power is 2/5 in right arm and leg, now 4/5 on left side.  Diffuse spasticity with B/L clonus. Sensory: Intact to light touch and temperature Gait: Non ambulatory  Labs: I have reviewed the data as listed    Component Value Date/Time   NA 141 11/02/2019 1020   K 4.3 11/02/2019 1020   CL 106 11/02/2019 1020   CO2 27 11/02/2019 1020   GLUCOSE 137 (H) 11/02/2019 1020   BUN 14 11/02/2019 1020   CREATININE 0.71 11/02/2019 1020   CALCIUM 9.6 11/02/2019 1020   PROT 7.1 11/02/2019 1020   ALBUMIN 3.4 (L) 11/02/2019 1020   AST 14 (L) 11/02/2019 1020   ALT 31 11/02/2019 1020   ALKPHOS 61 11/02/2019 1020   BILITOT 0.3 11/02/2019 1020   GFRNONAA >60 11/02/2019 1020   GFRAA >60 10/19/2019 1108   Lab Results  Component Value Date   WBC 10.3 11/16/2019   NEUTROABS 8.7 (H) 11/16/2019   HGB 16.0 11/16/2019   HCT 47.3 11/16/2019   MCV 90.4 11/16/2019   PLT 145 (L) 11/16/2019    Assessment/Plan Glioblastoma multiforme of temporal lobe ([place]) [C71.2]    [name] is clinically stable today, still with very poor functional status and overall quality of life.  His labs are within normal limits.  Patient and family would still like to complete course of Avastin and Temodar.  He is cleared to dose cycle #3 of Avastin today, 42m/kg IV q2 weeks.  He will also continue with the daily metronomic Temodar 542mm2.  Per [facility] team and drug company, Avapritinib will need to defer until Avastin is washed out (3 weeks) following any tumor progression.  We will continue current regimen until progression/failure on imaging.  Chemotherapy should be held for the following:  ANC less than 1,000  Platelets less than 100,000  LFT or creatinine greater than 2x ULN  If clinical concerns/contraindications develop    Avastin should be held for the following:  ANC less than 500  Platelets less than 50,000  LFT or creatinine greater than 2x ULN  If clinical concerns/contraindications develop    For N/V will continue Zofran 38m34mRN, and Decadron will con't 4mg22mily.    Keppra will con't at 250mg538m.  For suspected GERD will start Protonix 40mg 26my.  Also prescribing Tramadol 50mg q75mn severe pain.  We will try to get MRI brain performed sooner due to failure to thrive picture, concern for tumor progression clinically.  We ask that Corran IBlenda Mounts to clinic following MRI or for next avastin in 2 weeks.  All questions were answered. The patient knows to call the clinic with any problems, questions or concerns. No barriers to learning were detected.  I have spent a total of 40 minutes of face-to-face and non-face-to-face time, excluding clinical staff time, preparing to see patient, ordering tests and/or medications, counseling the patient, and independently interpreting results and communicating results to the patient/family/caregiver   ZacharyVentura Sellersdical Director of Neuro-Oncology [facility] [address] 12:28 PM

## 2019-11-17 ENCOUNTER — Telehealth: Payer: Self-pay | Admitting: Internal Medicine

## 2019-11-17 ENCOUNTER — Other Ambulatory Visit: Payer: Self-pay | Admitting: *Deleted

## 2019-11-17 DIAGNOSIS — C712 Malignant neoplasm of temporal lobe: Secondary | ICD-10-CM

## 2019-11-17 NOTE — Telephone Encounter (Signed)
Scheduled per 11/1 los. Spoke with pt's mother and is aware of appt times and date.

## 2019-11-19 ENCOUNTER — Other Ambulatory Visit: Payer: Self-pay

## 2019-11-19 ENCOUNTER — Ambulatory Visit (HOSPITAL_COMMUNITY)
Admission: RE | Admit: 2019-11-19 | Discharge: 2019-11-19 | Disposition: A | Discharge status: Home / Self Care | Payer: 59 | Source: Ambulatory Visit | Attending: Internal Medicine | Admitting: Internal Medicine

## 2019-11-19 DIAGNOSIS — C712 Malignant neoplasm of temporal lobe: Secondary | ICD-10-CM | POA: Diagnosis not present

## 2019-11-19 MED ORDER — GADOBUTROL 1 MMOL/ML IV SOLN
7.9000 mL | Freq: Once | INTRAVENOUS | Status: AC | PRN
  Administered 2019-11-19: 7.9 mL via INTRAVENOUS

## 2019-11-20 ENCOUNTER — Other Ambulatory Visit: Payer: Self-pay | Admitting: Internal Medicine

## 2019-11-20 DIAGNOSIS — C712 Malignant neoplasm of temporal lobe: Secondary | ICD-10-CM

## 2019-11-20 NOTE — Telephone Encounter (Signed)
Refill request

## 2019-11-23 ENCOUNTER — Encounter (HOSPITAL_COMMUNITY): Payer: Self-pay | Admitting: Emergency Medicine

## 2019-11-23 ENCOUNTER — Inpatient Hospital Stay (HOSPITAL_COMMUNITY)
Admission: EM | Admit: 2019-11-23 | Discharge: 2019-11-26 | DRG: 070 | Disposition: A | Discharge status: Hospice | Payer: 59 | Attending: Internal Medicine | Admitting: Internal Medicine

## 2019-11-23 ENCOUNTER — Emergency Department (HOSPITAL_COMMUNITY): Payer: 59

## 2019-11-23 ENCOUNTER — Inpatient Hospital Stay (HOSPITAL_COMMUNITY)
Admit: 2019-11-23 | Discharge: 2019-11-23 | Disposition: A | Discharge status: Home / Self Care | Payer: 59 | Attending: Internal Medicine | Admitting: Internal Medicine

## 2019-11-23 ENCOUNTER — Other Ambulatory Visit: Payer: Self-pay

## 2019-11-23 ENCOUNTER — Telehealth: Payer: Self-pay | Admitting: *Deleted

## 2019-11-23 DIAGNOSIS — R509 Fever, unspecified: Secondary | ICD-10-CM

## 2019-11-23 DIAGNOSIS — E876 Hypokalemia: Secondary | ICD-10-CM | POA: Diagnosis present

## 2019-11-23 DIAGNOSIS — G8111 Spastic hemiplegia affecting right dominant side: Secondary | ICD-10-CM | POA: Diagnosis present

## 2019-11-23 DIAGNOSIS — R4182 Altered mental status, unspecified: Secondary | ICD-10-CM | POA: Diagnosis present

## 2019-11-23 DIAGNOSIS — G936 Cerebral edema: Secondary | ICD-10-CM | POA: Diagnosis present

## 2019-11-23 DIAGNOSIS — Z79899 Other long term (current) drug therapy: Secondary | ICD-10-CM | POA: Diagnosis not present

## 2019-11-23 DIAGNOSIS — R4701 Aphasia: Secondary | ICD-10-CM | POA: Diagnosis present

## 2019-11-23 DIAGNOSIS — Z7189 Other specified counseling: Secondary | ICD-10-CM | POA: Diagnosis not present

## 2019-11-23 DIAGNOSIS — Z515 Encounter for palliative care: Secondary | ICD-10-CM

## 2019-11-23 DIAGNOSIS — E663 Overweight: Secondary | ICD-10-CM | POA: Diagnosis present

## 2019-11-23 DIAGNOSIS — T451X5A Adverse effect of antineoplastic and immunosuppressive drugs, initial encounter: Secondary | ICD-10-CM | POA: Diagnosis present

## 2019-11-23 DIAGNOSIS — Z7952 Long term (current) use of systemic steroids: Secondary | ICD-10-CM | POA: Diagnosis not present

## 2019-11-23 DIAGNOSIS — D6959 Other secondary thrombocytopenia: Secondary | ICD-10-CM | POA: Diagnosis present

## 2019-11-23 DIAGNOSIS — G8110 Spastic hemiplegia affecting unspecified side: Secondary | ICD-10-CM | POA: Diagnosis present

## 2019-11-23 DIAGNOSIS — Z66 Do not resuscitate: Secondary | ICD-10-CM | POA: Diagnosis present

## 2019-11-23 DIAGNOSIS — G9341 Metabolic encephalopathy: Secondary | ICD-10-CM | POA: Diagnosis present

## 2019-11-23 DIAGNOSIS — Z6825 Body mass index (BMI) 25.0-25.9, adult: Secondary | ICD-10-CM

## 2019-11-23 DIAGNOSIS — G253 Myoclonus: Secondary | ICD-10-CM | POA: Diagnosis present

## 2019-11-23 DIAGNOSIS — Z20822 Contact with and (suspected) exposure to covid-19: Secondary | ICD-10-CM | POA: Diagnosis present

## 2019-11-23 DIAGNOSIS — G9340 Encephalopathy, unspecified: Secondary | ICD-10-CM | POA: Diagnosis not present

## 2019-11-23 DIAGNOSIS — C712 Malignant neoplasm of temporal lobe: Secondary | ICD-10-CM | POA: Diagnosis present

## 2019-11-23 HISTORY — DX: Malignant neoplasm of brain, unspecified: C71.9

## 2019-11-23 LAB — PROTEIN AND GLUCOSE, CSF
Glucose, CSF: <20 mg/dL — CL (ref 40–70)
Total  Protein, CSF: >600 mg/dL — ABNORMAL HIGH (ref 15–45)

## 2019-11-23 LAB — CSF CELL COUNT WITH DIFFERENTIAL
RBC Count, CSF: 138 /mm3 — ABNORMAL HIGH
Tube #: 4
WBC, CSF: 10 /mm3 — ABNORMAL HIGH (ref 0–5)

## 2019-11-23 LAB — LACTIC ACID, PLASMA
Lactic Acid, Venous: 2.4 mmol/L (ref 0.5–1.9)
Lactic Acid, Venous: 2.6 mmol/L (ref 0.5–1.9)

## 2019-11-23 LAB — CBC WITH DIFFERENTIAL/PLATELET
Abs Immature Granulocytes: 0.32 10*3/uL — ABNORMAL HIGH (ref 0.00–0.07)
Basophils Absolute: 0.1 10*3/uL (ref 0.0–0.1)
Basophils Relative: 0 %
Eosinophils Absolute: 0 10*3/uL (ref 0.0–0.5)
Eosinophils Relative: 0 %
HCT: 44.6 % (ref 39.0–52.0)
Hemoglobin: 15.1 g/dL (ref 13.0–17.0)
Immature Granulocytes: 2 %
Lymphocytes Relative: 4 %
Lymphs Abs: 0.5 10*3/uL — ABNORMAL LOW (ref 0.7–4.0)
MCH: 30.8 pg (ref 26.0–34.0)
MCHC: 33.9 g/dL (ref 30.0–36.0)
MCV: 90.8 fL (ref 80.0–100.0)
Monocytes Absolute: 1 10*3/uL (ref 0.1–1.0)
Monocytes Relative: 7 %
Neutro Abs: 11.7 10*3/uL — ABNORMAL HIGH (ref 1.7–7.7)
Neutrophils Relative %: 87 %
Platelets: 143 10*3/uL — ABNORMAL LOW (ref 150–400)
RBC: 4.91 MIL/uL (ref 4.22–5.81)
RDW: 16 % — ABNORMAL HIGH (ref 11.5–15.5)
WBC: 13.6 10*3/uL — ABNORMAL HIGH (ref 4.0–10.5)
nRBC: 0 % (ref 0.0–0.2)

## 2019-11-23 LAB — PROTIME-INR
INR: 1 (ref 0.8–1.2)
Prothrombin Time: 12.6 seconds (ref 11.4–15.2)

## 2019-11-23 LAB — COMPREHENSIVE METABOLIC PANEL
ALT: 28 U/L (ref 0–44)
AST: 24 U/L (ref 15–41)
Albumin: 3.9 g/dL (ref 3.5–5.0)
Alkaline Phosphatase: 42 U/L (ref 38–126)
Anion gap: 11 (ref 5–15)
BUN: 20 mg/dL (ref 6–20)
CO2: 25 mmol/L (ref 22–32)
Calcium: 9.1 mg/dL (ref 8.9–10.3)
Chloride: 102 mmol/L (ref 98–111)
Creatinine, Ser: 0.9 mg/dL (ref 0.61–1.24)
GFR, Estimated: >60 mL/min (ref >60)
Glucose, Bld: 97 mg/dL (ref 70–99)
Potassium: 3.4 mmol/L — ABNORMAL LOW (ref 3.5–5.1)
Sodium: 138 mmol/L (ref 135–145)
Total Bilirubin: 0.8 mg/dL (ref 0.3–1.2)
Total Protein: 6.6 g/dL (ref 6.5–8.1)

## 2019-11-23 LAB — RESPIRATORY PANEL BY RT PCR (FLU A&B, COVID)
Influenza A by PCR: NEGATIVE
Influenza B by PCR: NEGATIVE
SARS Coronavirus 2 by RT PCR: NEGATIVE

## 2019-11-23 LAB — URINALYSIS, ROUTINE W REFLEX MICROSCOPIC
Bacteria, UA: NONE SEEN
Bilirubin Urine: NEGATIVE
Glucose, UA: NEGATIVE mg/dL
Hgb urine dipstick: NEGATIVE
Ketones, ur: NEGATIVE mg/dL
Leukocytes,Ua: NEGATIVE
Nitrite: NEGATIVE
Protein, ur: 30 mg/dL — AB
Specific Gravity, Urine: 1.021 (ref 1.005–1.030)
pH: 5 (ref 5.0–8.0)

## 2019-11-23 MED ORDER — LEVETIRACETAM IN NACL 500 MG/100ML IV SOLN
500.0000 mg | Freq: Two times a day (BID) | INTRAVENOUS | Status: DC
  Administered 2019-11-23 – 2019-11-24 (×3): 500 mg via INTRAVENOUS
  Filled 2019-11-23 (×3): qty 100

## 2019-11-23 MED ORDER — ONDANSETRON HCL 4 MG PO TABS: 4.0000 mg | ORAL_TABLET | Freq: Four times a day (QID) | ORAL | Status: DC | PRN

## 2019-11-23 MED ORDER — LIDOCAINE HCL (PF) 1 % IJ SOLN
INTRAMUSCULAR | Status: AC
  Filled 2019-11-23: qty 5

## 2019-11-23 MED ORDER — SODIUM CHLORIDE 0.9 % IV SOLN
INTRAVENOUS | Status: DC | PRN
  Administered 2019-11-23: 500 mL via INTRAVENOUS

## 2019-11-23 MED ORDER — POTASSIUM CHLORIDE 10 MEQ/100ML IV SOLN
INTRAVENOUS | Status: AC
  Administered 2019-11-23: 10 meq via INTRAVENOUS
  Filled 2019-11-23: qty 100

## 2019-11-23 MED ORDER — ONDANSETRON HCL 4 MG/2ML IJ SOLN: 4.0000 mg | Freq: Four times a day (QID) | INTRAMUSCULAR | Status: DC | PRN

## 2019-11-23 MED ORDER — PANTOPRAZOLE SODIUM 40 MG PO TBEC: 40.0000 mg | DELAYED_RELEASE_TABLET | Freq: Every day | ORAL | Status: DC

## 2019-11-23 MED ORDER — POTASSIUM CHLORIDE 10 MEQ/100ML IV SOLN
10.0000 meq | INTRAVENOUS | Status: AC
  Administered 2019-11-23 (×2): 10 meq via INTRAVENOUS
  Filled 2019-11-23 (×2): qty 100

## 2019-11-23 MED ORDER — LACTATED RINGERS IV SOLN: INTRAVENOUS | Status: DC

## 2019-11-23 MED ORDER — POVIDONE-IODINE 10 % EX SOLN
CUTANEOUS | Status: AC
  Filled 2019-11-23: qty 15

## 2019-11-23 MED ORDER — POTASSIUM CHLORIDE 10 MEQ/100ML IV SOLN
INTRAVENOUS | Status: AC
  Administered 2019-11-24: 10 meq via INTRAVENOUS
  Filled 2019-11-23: qty 100

## 2019-11-23 MED ORDER — SODIUM CHLORIDE 0.9 % IV BOLUS
1000.0000 mL | Freq: Once | INTRAVENOUS | Status: AC
  Administered 2019-11-23: 1000 mL via INTRAVENOUS

## 2019-11-23 MED ORDER — ACETAMINOPHEN 325 MG PO TABS: 650.0000 mg | ORAL_TABLET | Freq: Four times a day (QID) | ORAL | Status: DC | PRN

## 2019-11-23 MED ORDER — LORAZEPAM 2 MG/ML IJ SOLN
INTRAMUSCULAR | Status: AC
  Administered 2019-11-23: 2 mg
  Filled 2019-11-23: qty 1

## 2019-11-23 MED ORDER — DEXTROSE 5 % IV SOLN
10.0000 mg/kg | Freq: Three times a day (TID) | INTRAVENOUS | Status: DC
  Administered 2019-11-23 – 2019-11-26 (×8): 790 mg via INTRAVENOUS
  Filled 2019-11-23 (×12): qty 15.8

## 2019-11-23 MED ORDER — CAMPHOR-MENTHOL 0.5-0.5 % EX LOTN
1.0000 "application " | TOPICAL_LOTION | CUTANEOUS | Status: DC | PRN
  Filled 2019-11-23: qty 222

## 2019-11-23 MED ORDER — ACETAMINOPHEN 650 MG RE SUPP: 650.0000 mg | Freq: Four times a day (QID) | RECTAL | Status: DC | PRN

## 2019-11-23 MED ORDER — VANCOMYCIN HCL IN DEXTROSE 1-5 GM/200ML-% IV SOLN
1000.0000 mg | Freq: Three times a day (TID) | INTRAVENOUS | Status: DC
  Administered 2019-11-24 – 2019-11-26 (×6): 1000 mg via INTRAVENOUS
  Filled 2019-11-23 (×8): qty 200

## 2019-11-23 MED ORDER — SODIUM CHLORIDE 0.9 % IV SOLN
2.0000 g | Freq: Two times a day (BID) | INTRAVENOUS | Status: DC
  Administered 2019-11-23 – 2019-11-26 (×6): 2 g via INTRAVENOUS
  Filled 2019-11-23 (×6): qty 20

## 2019-11-23 MED ORDER — DEXAMETHASONE 4 MG PO TABS: 10.0000 mg | ORAL_TABLET | Freq: Every day | ORAL | Status: DC

## 2019-11-23 MED ORDER — VANCOMYCIN HCL 2000 MG/400ML IV SOLN
2000.0000 mg | Freq: Once | INTRAVENOUS | Status: AC
  Administered 2019-11-23: 2000 mg via INTRAVENOUS
  Filled 2019-11-23: qty 400

## 2019-11-23 MED ORDER — ESCITALOPRAM OXALATE 10 MG PO TABS
5.0000 mg | ORAL_TABLET | Freq: Every day | ORAL | Status: DC
  Administered 2019-11-23: 5 mg via ORAL
  Filled 2019-11-23 (×2): qty 1

## 2019-11-23 MED ORDER — DEXAMETHASONE SODIUM PHOSPHATE 10 MG/ML IJ SOLN: 8.0000 mg | Freq: Once | INTRAMUSCULAR | Status: DC

## 2019-11-23 MED ORDER — DEXAMETHASONE 4 MG PO TABS: 4.0000 mg | ORAL_TABLET | Freq: Every day | ORAL | Status: DC

## 2019-11-23 MED ORDER — ACYCLOVIR SODIUM 50 MG/ML IV SOLN
INTRAVENOUS | Status: AC
  Filled 2019-11-23: qty 10

## 2019-11-23 MED ORDER — LORAZEPAM 2 MG/ML IJ SOLN
2.0000 mg | INTRAMUSCULAR | Status: DC | PRN
  Administered 2019-11-24 – 2019-11-26 (×9): 2 mg via INTRAVENOUS
  Filled 2019-11-23 (×11): qty 1

## 2019-11-23 NOTE — ED Notes (Signed)
Bed ready; attempt to call report

## 2019-11-23 NOTE — ED Provider Notes (Signed)
[facility] Emergency Department Provider Note MRN:  [id number]  Arrival date & time: 11/23/19     Chief Complaint   Altered Mental Status   History of Present Illness   Gregory Moreno is a 24 y.o. year-old male with a history of glioblastoma presenting to the ED with chief complaint of mental status.  Glioblastoma history with debulking surgery and surgery to remove intracranial bleeding back in September.  Overall a debilitated baseline per recent records, here for worsening altered mental status, possible seizure activity at home today.  Arrives with fever.  I was unable to obtain an accurate HPI, PMH, or ROS due to the patient's altered mental status.  Level 5 caveat.  Review of Systems  Positive for fever, altered mental status.  Patient's Health History   History reviewed. No pertinent past medical history.  Past Surgical History:  Procedure Laterality Date  . CRANIOTOMY Left 10/13/2018   Procedure: LEFT CRANIOTOMY FOR TUMOR RESECTION;  Surgeon: Judith Part, MD;  Location: [facility];  Service: Neurosurgery;  Laterality: Left;    No family history on file.  Social History   Socioeconomic History  . Marital status: Single    Spouse name: Not on file  . Number of children: Not on file  . Years of education: Not on file  . Highest education level: Not on file  Occupational History  . Not on file  Tobacco Use  . Smoking status: Never Smoker  . Smokeless tobacco: Never Used  Substance and Sexual Activity  . Alcohol use: Not on file  . Drug use: Not on file  . Sexual activity: Not on file  Other Topics Concern  . Not on file  Social History Narrative  . Not on file   Social Determinants of Health   Financial Resource Strain:   . Difficulty of Paying Living Expenses: Not on file  Food Insecurity:   . Worried About Charity fundraiser in the Last Year: Not on file  . Ran Out of Food in the Last Year: Not on file  Transportation  Needs:   . Lack of Transportation (Medical): Not on file  . Lack of Transportation (Non-Medical): Not on file  Physical Activity:   . Days of Exercise per Week: Not on file  . Minutes of Exercise per Session: Not on file  Stress:   . Feeling of Stress : Not on file  Social Connections:   . Frequency of Communication with Friends and Family: Not on file  . Frequency of Social Gatherings with Friends and Family: Not on file  . Attends Religious Services: Not on file  . Active Member of Clubs or Organizations: Not on file  . Attends Archivist Meetings: Not on file  . Marital Status: Not on file  Intimate Partner Violence:   . Fear of Current or Ex-Partner: Not on file  . Emotionally Abused: Not on file  . Physically Abused: Not on file  . Sexually Abused: Not on file     Physical Exam   Vitals:   11/23/19 1305 11/23/19 1311  BP: 99/66   Pulse: 83   Resp: 14   Temp:  98 F (36.7 C)  SpO2: 95%     CONSTITUTIONAL: Chronically ill-appearing, NAD NEURO: Somnolent, opens eyes to pain, spastic deformities to right arm, does not follow commands EYES: Pupils dilated bilaterally ENT/NECK:  no LAD, no JVD CARDIO: Tachycardic rate, well-perfused, normal S1 and S2 PULM:  CTAB no wheezing or  rhonchi GI/GU:  normal bowel sounds, non-distended, non-tender MSK/SPINE:  No gross deformities, no edema SKIN:  no rash, atraumatic PSYCH:  Appropriate speech and behavior  *Additional and/or pertinent findings included in MDM below  Diagnostic and Interventional Summary    EKG Interpretation  Date/Time:  Monday November 23 2019 09:27:39 EST Ventricular Rate:  96 PR Interval:    QRS Duration: 80 QT Interval:  363 QTC Calculation: 459 R Axis:   81 Text Interpretation: Sinus rhythm Borderline repolarization abnormality Confirmed by Gerlene Fee [phone]) on 11/23/2019 1:14:21 PM      Labs Reviewed  COMPREHENSIVE METABOLIC PANEL - Abnormal; Notable for the following components:       Result Value   Potassium 3.4 (*)    All other components within normal limits  LACTIC ACID, PLASMA - Abnormal; Notable for the following components:   Lactic Acid, Venous 2.4 (*)    All other components within normal limits  CBC WITH DIFFERENTIAL/PLATELET - Abnormal; Notable for the following components:   WBC 13.6 (*)    RDW 16.0 (*)    Platelets 143 (*)    Neutro Abs 11.7 (*)    Lymphs Abs 0.5 (*)    Abs Immature Granulocytes 0.32 (*)    All other components within normal limits  URINALYSIS, ROUTINE W REFLEX MICROSCOPIC - Abnormal; Notable for the following components:   Protein, ur 30 (*)    All other components within normal limits  CULTURE, BLOOD (ROUTINE X 2)  CULTURE, BLOOD (ROUTINE X 2)  RESPIRATORY PANEL BY RT PCR (FLU A&B, COVID)  PROTIME-INR  LACTIC ACID, PLASMA    CT HEAD WO CONTRAST  Final Result    DG Chest Port 1 View  Final Result    DG Lumbar Puncture Fluoro Guide    (Results Pending)    Medications  vancomycin (VANCOCIN) IVPB 1000 mg/200 mL premix (has no administration in time range)  lidocaine (PF) (XYLOCAINE) 1 % injection (has no administration in time range)  povidone-iodine (BETADINE) 10 % external solution (has no administration in time range)  sodium chloride 0.9 % bolus 1,000 mL (1,000 mLs Intravenous New Bag/Given 11/23/19 1006)  vancomycin (VANCOREADY) IVPB 2000 mg/400 mL (0 mg Intravenous Stopped 11/23/19 1217)  LORazepam (ATIVAN) 2 MG/ML injection (2 mg  Given 11/23/19 1152)     Procedures  /  Critical Care .Critical Care Performed by: Maudie Flakes, MD Authorized by: Maudie Flakes, MD   Critical care provider statement:    Critical care time (minutes):  32   Critical care was necessary to treat or prevent imminent or life-threatening deterioration of the following conditions:  CNS failure or compromise (worsening encephalopathy, concern for sepsis/CNS infection)   Critical care was time spent personally by me on the following  activities:  Discussions with consultants, evaluation of patient's response to treatment, examination of patient, ordering and performing treatments and interventions, ordering and review of laboratory studies, ordering and review of radiographic studies, pulse oximetry, re-evaluation of patient's condition, obtaining history from patient or surrogate and review of old charts .Lumbar Puncture  Date/Time: 11/23/2019 1:10 PM Performed by: Maudie Flakes, MD Authorized by: Maudie Flakes, MD   Consent:    Consent obtained:  Verbal and written   Consent given by:  Parent   Risks discussed:  Bleeding, headache, infection, pain, repeat procedure and nerve damage   Alternatives discussed:  No treatment Pre-procedure details:    Procedure purpose:  Diagnostic   Preparation: Patient was prepped and draped  in usual sterile fashion   Sedation:    Sedation type:  Anxiolysis (ativan IV prn) Anesthesia (see MAR for exact dosages):    Anesthesia method:  Local infiltration   Local anesthetic:  Lidocaine 1% w/o epi Procedure details:    Lumbar space:  L3-L4 interspace   Patient position:  L lateral decubitus   Needle gauge:  20   Needle length (in):  1.5   Number of attempts:  2 Post-procedure:    Puncture site:  Adhesive bandage applied   Patient tolerance of procedure:  Tolerated well, no immediate complications Comments:     Difficult LP attempt due to mild agitation and difficulties obtaining proper positioning.  Unsuccessful, unable to obtain CSF fluid.    ED Course and Medical Decision Making  I have reviewed the triage vital signs, the nursing notes, and pertinent available records from the EMR.  Listed above are laboratory and imaging tests that I personally ordered, reviewed, and interpreted and then considered in my medical decision making (see below for details).  Concern for sepsis versus worsening intracranial pressure due to bleeding versus progression of tumor burden, less  likely but also considering CNS infection.  Awaiting chest x-ray, urinalysis, labs, has received ceftriaxone with EMS, providing IV fluids.     Work-up is overall without obvious source of fever but there is leukocytosis and mildly elevated lactate.  Given the fever, worsening mental status, lumbar puncture attempted as described above, unfortunately unsuccessful.  Admitted to hospitalist service and fluoroscopic lumbar puncture ordered.  Patient did have redness of the left upper extremity shortly after starting vancomycin and so this was paused  Barth Kirks. Sedonia Small, MD [place] [email or web address]  Final Clinical Impressions(s) / ED Diagnoses     ICD-10-CM   1. Fever, unspecified fever cause  R50.9   2. AMS (altered mental status)  R41.82 DG Chest [facility] 1 View    DG Chest Port 1 View  3. Altered mental status, unspecified altered mental status type  R41.82     ED Discharge Orders    None       Discharge Instructions Discussed with and Provided to Patient:   Discharge Instructions   None       Maudie Flakes, MD 11/23/19 1314

## 2019-11-23 NOTE — ED Notes (Signed)
Date and time results received: 11/23/19 1634 (use smartphrase ".now" to insert current time)  Test: CSF glucose Critical Value: 20  Name of Provider Notified: Brigitte Pulse  Orders Received? Or Actions Taken?:

## 2019-11-23 NOTE — Consult Note (Signed)
Webb A. Merlene Laughter, MD     [email or web address]          Gregory Moreno is an 24 y.o. male.   ASSESSMENT/PLAN: 1. Acute encephalopathy: Etiology includes primary CNS infection such as meningitis or encephalitis, seizures and the progression of glioblastoma. An EEG will be obtained. The patient has been started on broad-spectrum high doses doses antibiotics. His baseline steroids will be increased. 2. Glioblastoma multiforme with recent recurrence 3. 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 The patient presented September 2020 with headache and focal neurological deficit/right-sided hemiparesis. He subsequently diagnosed with the about some multiforme in his had typical treatment with improvement of his aphasia and right-sided hemiparesis to the point he was able to ambulate. He was on steroids with this was discontinued until several weeks ago when he had a down turn for the worse with it. Neurological function in imaging showing recurrence of the brain tumor. The mother noted this morning that he was not his usual self being much more restless in the morning wakening and unresponsiveness. Additionally, she noted fever when he 4. The patient did have a low-grade fever here 100.8 but has been afebrile since. Patient is being seen by a neuro oncologist in [place] also has input proved from [facility] and [facility] for experimental treatment. This is apparently going to be started about 2 weeks from now. Patient is on seizure medication but has never had a clear clinical seizure. The mother reports patient having jerking activity of the legs which seems consistent with myoclonus. They happen most and right-sided also left side.  GENERAL: This is a mildly overweight male who is laying unresponsive in bed.  HEENT: Neck is most is  supple although there is some axial rigidity noted episodically. No trauma noted.  ABDOMEN: Soft  EXTREMITIES: No edema   BACK: Normal alignment.  SKIN: Normal by inspection.    MENTAL STATUS: He lays in bed with eyes closed. He does open his eyes to deep painful stimuli. He occasionally tends to follow commands. There is no verbal output and he clearly has a severe global aphasia.  CRANIAL NERVES: Pupils are equal, round and reactive to light (often quite dilated 8 mm at times); extraocular movements are full - by oculocephalic reflexes, there is no significant nystagmus; upper and lower facial muscles are normal in strength and symmetric, there is no flattening of the nasolabial folds.  MOTOR: He does lift the upper extremity against gravity. The left lower extremity is graded as 2/5. Right upper extremity is flexed with markedly increased tone/spasticity in graded as 0/5. The right leg has intermittent increased tone in graded as 1 to 2/5.  COORDINATION: No abnormal movements noted at this time. There is no tremors, myoclonus or clues noted. No parkinsonian features.  REFLEXES: Deep tendon reflexes are symmetrical and normal to brisk throughout. Plantar responses are upgoing on the right and equivocal on the left.   SENSATION: Grimaces to deep painful stimuli bilaterally.    Blood pressure (!) 75/48, pulse 79, temperature 98 F (36.7 C), temperature source Axillary, resp. rate (!) 9, height 5\' 9"  (1.753 m), weight 79.2 kg, SpO2 100 %.  Past Medical History:  Diagnosis Date  . Glioblastoma [facility])     Past Surgical History:  Procedure Laterality Date  . CRANIOTOMY Left 10/13/2018   Procedure: LEFT CRANIOTOMY  FOR TUMOR RESECTION;  Surgeon: Judith Part, MD;  Location: [facility];  Service: Neurosurgery;  Laterality: Left;    No family history on file.  Social History:  reports that he has never smoked. He has never used smokeless tobacco. No history on file for alcohol use and  drug use.  Allergies: No Known Allergies  Medications: Prior to Admission medications   Medication Sig Start Date End Date Taking? Authorizing Provider  Baclofen 5 MG TABS Take 1 tablet by mouth daily as needed.  12/25/18  Yes [provider]  camphor-menthol Timoteo Ace) lotion Apply 1 application topically as needed for itching. 06/04/19  Yes Raulkar, Clide Deutscher, MD  dexamethasone (DECADRON) 4 MG tablet Take 1 tablet (4 mg total) by mouth daily. 11/02/19  Yes Vaslow, Acey Lav, MD  dronabinol (MARINOL) 5 MG capsule Take 1 capsule (5 mg total) by mouth 2 (two) times daily before lunch and supper. 10/03/19  Yes Lane Hacker L, DO  escitalopram (LEXAPRO) 5 MG tablet Take 1 tablet (5 mg total) by mouth at bedtime. 10/15/19  Yes Vaslow, Acey Lav, MD  levETIRAcetam (KEPPRA) 250 MG tablet Take 1 tablet (250 mg total) by mouth 2 (two) times daily. 09/24/19  Yes Vaslow, Acey Lav, MD  ondansetron (ZOFRAN) 4 MG tablet Take 1 tablet (4 mg total) by mouth every 8 (eight) hours as needed for nausea or vomiting. Patient taking differently: Take 4 mg by mouth in the morning and at bedtime.  11/02/19  Yes Vaslow, Acey Lav, MD  pantoprazole (PROTONIX) 40 MG tablet Take 1 tablet (40 mg total) by mouth daily. 11/16/19  Yes Vaslow, Acey Lav, MD  senna (SENOKOT) 8.6 MG tablet Take 2 tablets by mouth daily as needed for constipation.    Yes [provider]  temozolomide (TEMODAR) 100 MG capsule TAKE 1 CAPSULE BY MOUTH ONCE DAILY. MAY TAKE ON EMPTY STOMACH TO DECREASE NAUSEA AND VOMITING Patient taking differently: Take 100 mg by mouth daily.  11/20/19  Yes Vaslow, Acey Lav, MD  traMADol (ULTRAM) 50 MG tablet Take 1 tablet (50 mg total) by mouth every 6 (six) hours as needed for severe pain. 11/16/19  Yes Vaslow, Acey Lav, MD  escitalopram (LEXAPRO) 5 MG tablet TAKE 1 TABLET BY MOUTH EVERYDAY AT BEDTIME 02/20/19   Raulkar, Clide Deutscher, MD    Scheduled Meds: . [START ON 11/24/2019] dexamethasone  4 mg  Oral Daily  . escitalopram  5 mg Oral QHS  . [START ON 11/24/2019] pantoprazole  40 mg Oral Daily   Continuous Infusions: . cefTRIAXone (ROCEPHIN)  IV 2 g (11/23/19 1558)  . lactated ringers    . levETIRAcetam    . potassium chloride    . vancomycin     PRN Meds:.acetaminophen **OR** acetaminophen, camphor-menthol, LORazepam, ondansetron **OR** ondansetron (ZOFRAN) IV     Results for orders placed or performed during the hospital encounter of 11/23/19 (from the past 48 hour(s))  Culture, blood (Routine x 2)     Status: None (Preliminary result)   Collection Time: 11/23/19  9:20 AM   Specimen: BLOOD LEFT HAND  Result Value Ref Range   Specimen Description BLOOD LEFT HAND    Special Requests      BOTTLES DRAWN AEROBIC AND ANAEROBIC Blood Culture adequate volume Performed at [facility], [address] [place], [place] [address]    Culture PENDING    Report Status PENDING   Urinalysis, Routine w reflex microscopic Urine, Catheterized     Status: Abnormal   Collection Time:  11/23/19  9:24 AM  Result Value Ref Range   Color, Urine YELLOW YELLOW   APPearance CLEAR CLEAR   Specific Gravity, Urine 1.021 1.005 - 1.030   pH 5.0 5.0 - 8.0   Glucose, UA NEGATIVE NEGATIVE mg/dL   Hgb urine dipstick NEGATIVE NEGATIVE   Bilirubin Urine NEGATIVE NEGATIVE   Ketones, ur NEGATIVE NEGATIVE mg/dL   Protein, ur 30 (A) NEGATIVE mg/dL   Nitrite NEGATIVE NEGATIVE   Leukocytes,Ua NEGATIVE NEGATIVE   RBC / HPF 0-5 0 - 5 RBC/hpf   WBC, UA 0-5 0 - 5 WBC/hpf   Bacteria, UA NONE SEEN NONE SEEN   Squamous Epithelial / LPF 0-5 0 - 5   Mucus PRESENT    Hyaline Casts, UA PRESENT     Comment: Performed at [facility], [address] [place], [place] [address]  Comprehensive metabolic panel     Status: Abnormal   Collection Time: 11/23/19  9:48 AM  Result Value Ref Range   Sodium 138 135 - 145 mmol/L   Potassium 3.4 (L) 3.5 - 5.1 mmol/L   Chloride 102 98 - 111 mmol/L   CO2 25 22 - 32 mmol/L    Glucose, Bld 97 70 - 99 mg/dL    Comment: Glucose reference range applies only to samples taken after fasting for at least 8 hours.   BUN 20 6 - 20 mg/dL   Creatinine, Ser 0.90 0.61 - 1.24 mg/dL   Calcium 9.1 8.9 - 10.3 mg/dL   Total Protein 6.6 6.5 - 8.1 g/dL   Albumin 3.9 3.5 - 5.0 g/dL   AST 24 15 - 41 U/L   ALT 28 0 - 44 U/L   Alkaline Phosphatase 42 38 - 126 U/L   Total Bilirubin 0.8 0.3 - 1.2 mg/dL   GFR, Estimated >60 >60 mL/min    Comment: (NOTE) Calculated using the CKD-EPI Creatinine Equation (2021)    Anion gap 11 5 - 15    Comment: Performed at [facility], [address], [place], [place] [address]  Lactic acid, plasma     Status: Abnormal   Collection Time: 11/23/19  9:48 AM  Result Value Ref Range   Lactic Acid, Venous 2.4 (HH) 0.5 - 1.9 mmol/L    Comment: CRITICAL RESULT CALLED TO, READ BACK BY AND VERIFIED WITH: DOSS,M AT 10:10AM ON 11/23/19 BY [facility] Performed at [facility], [address], [place], [place] [address]   CBC with Differential     Status: Abnormal   Collection Time: 11/23/19  9:48 AM  Result Value Ref Range   WBC 13.6 (H) 4.0 - 10.5 K/uL   RBC 4.91 4.22 - 5.81 MIL/uL   Hemoglobin 15.1 13.0 - 17.0 g/dL   HCT 44.6 39 - 52 %   MCV 90.8 80.0 - 100.0 fL   MCH 30.8 26.0 - 34.0 pg   MCHC 33.9 30.0 - 36.0 g/dL   RDW 16.0 (H) 11.5 - 15.5 %   Platelets 143 (L) 150 - 400 K/uL   nRBC 0.0 0.0 - 0.2 %   Neutrophils Relative % 87 %   Neutro Abs 11.7 (H) 1.7 - 7.7 K/uL   Lymphocytes Relative 4 %   Lymphs Abs 0.5 (L) 0.7 - 4.0 K/uL   Monocytes Relative 7 %   Monocytes Absolute 1.0 0.1 - 1.0 K/uL   Eosinophils Relative 0 %   Eosinophils Absolute 0.0 0.0 - 0.5 K/uL   Basophils Relative 0 %   Basophils Absolute 0.1 0.0 - 0.1 K/uL  Immature Granulocytes 2 %   Abs Immature Granulocytes 0.32 (H) 0.00 - 0.07 K/uL    Comment: Performed at [facility], [address], [place], [place] [address]  Protime-INR     Status: None   Collection Time: 11/23/19   9:48 AM  Result Value Ref Range   Prothrombin Time 12.6 11.4 - 15.2 seconds   INR 1.0 0.8 - 1.2    Comment: (NOTE) INR goal varies based on device and disease states. Performed at [facility], [address], [place], [place] [address]   Respiratory Panel by RT PCR (Flu A&B, Covid) - Nasopharyngeal Swab     Status: None   Collection Time: 11/23/19  9:48 AM   Specimen: Nasopharyngeal Swab  Result Value Ref Range   SARS Coronavirus 2 by RT PCR NEGATIVE NEGATIVE    Comment: (NOTE) SARS-CoV-2 target nucleic acids are NOT DETECTED.  The SARS-CoV-2 RNA is generally detectable in upper respiratoy specimens during the acute phase of infection. The lowest concentration of SARS-CoV-2 viral copies this assay can detect is 131 copies/mL. A negative result does not preclude SARS-Cov-2 infection and should not be used as the sole basis for treatment or other patient management decisions. A negative result may occur with  improper specimen collection/handling, submission of specimen other than nasopharyngeal swab, presence of viral mutation(s) within the areas targeted by this assay, and inadequate number of viral copies (<131 copies/mL). A negative result must be combined with clinical observations, patient history, and epidemiological information. The expected result is Negative.  Fact Sheet for Patients:  PinkCheek.be  Fact Sheet for Healthcare Providers:  GravelBags.it  This test is no t yet approved or cleared by the [place] FDA and  has been authorized for detection and/or diagnosis of SARS-CoV-2 by FDA under an Emergency Use Authorization (EUA). This EUA will remain  in effect (meaning this test can be used) for the duration of the COVID-19 declaration under Section 564(b)(1) of the Act, 21 U.S.C. section 360bbb-3(b)(1), unless the authorization is terminated or revoked sooner.     Influenza A by PCR NEGATIVE NEGATIVE    Influenza B by PCR NEGATIVE NEGATIVE    Comment: (NOTE) The Xpert Xpress SARS-CoV-2/FLU/RSV assay is intended as an aid in  the diagnosis of influenza from Nasopharyngeal swab specimens and  should not be used as a sole basis for treatment. Nasal washings and  aspirates are unacceptable for Xpert Xpress SARS-CoV-2/FLU/RSV  testing.  Fact Sheet for Patients: PinkCheek.be  Fact Sheet for Healthcare Providers: GravelBags.it  This test is not yet approved or cleared by the [place] FDA and  has been authorized for detection and/or diagnosis of SARS-CoV-2 by  FDA under an Emergency Use Authorization (EUA). This EUA will remain  in effect (meaning this test can be used) for the duration of the  Covid-19 declaration under Section 564(b)(1) of the Act, 21  U.S.C. section 360bbb-3(b)(1), unless the authorization is  terminated or revoked. Performed at [facility], [address], [place], [place] [address]   Culture, blood (Routine x 2)     Status: None (Preliminary result)   Collection Time: 11/23/19 10:17 AM   Specimen: BLOOD  Result Value Ref Range   Specimen Description BLOOD RIGHT ANTECUBITAL    Special Requests      BOTTLES DRAWN AEROBIC AND ANAEROBIC Blood Culture adequate volume Performed at [facility], [address], [place], [place] [address]    Culture PENDING    Report Status PENDING   Lactic acid, plasma  Status: Abnormal   Collection Time: 11/23/19 12:43 PM  Result Value Ref Range   Lactic Acid, Venous 2.6 (HH) 0.5 - 1.9 mmol/L    Comment: CRITICAL RESULT CALLED TO, READ BACK BY AND VERIFIED WITH: HEATHER CRAWFORD,RN @1331  11/23/2019 KAY Performed at [facility], [address], [place], [place] [address]   CSF cell count with differential     Status: Abnormal   Collection Time: 11/23/19  2:32 PM  Result Value Ref Range   Tube # 4    Color, CSF COLORLESS COLORLESS   Appearance, CSF CLEAR CLEAR    Supernatant CLEAR    RBC Count, CSF 138 (H) 0 /cu mm   WBC, CSF 10 (H) 0 - 5 /cu mm   Other Cells, CSF TOO FEW TO COUNT, SMEAR AVAILABLE FOR REVIEW     Comment: Performed at [facility], [address], [place], [place] [address]  Protein and glucose, CSF     Status: Abnormal   Collection Time: 11/23/19  2:33 PM  Result Value Ref Range   Glucose, CSF <20 (LL) 40 - 70 mg/dL    Comment: CRITICAL RESULT CALLED TO, READ BACK BY AND VERIFIED WITH: GANTT,E ON 11/23/19 AT 1635 BY LOY,C    Total  Protein, CSF >600 (H) 15 - 45 mg/dL    Comment: RESULTS CONFIRMED BY MANUAL DILUTION Performed at [facility], [address] [place], [place] [address]     Studies/Results:  HEAD CT  FINDINGS: Brain: The mass involving the right frontal white matter, corpus callosum, and right temporal lobe was better characterized on recent MRI with contrast, but appears grossly similar. Grossly similar cystic encephalomalacia in the left temporal lobe and edema in the left frontal lobe. Similar rounding of the right temporal horn, likely represent a component of ventricular entrapment. Similar ventricular size. No acute hemorrhage. Absence of contrast precludes evaluation for IAC and brainstem finding seen on prior MRI.  Vascular: No hyperdense vessel or unexpected calcification.  Skull: Left craniotomy.  Sinuses/Orbits: No acute findings.  Other: No mastoid effusions.  IMPRESSION: 1. The mass involving the right frontal white matter, corpus callosum, and right temporal lobe appears grossly similar when comparing across modalities. MRI with contrast could better evaluate for progression. 2. Similar rounding of the right temporal horn, likely representing a component of ventricular entrapment. No progressive ventriculomegaly. 3. No specific evidence of new/acute superimposed abnormality.        The head CT scan is reviewed in person and compared to the brain MRI scan both show a large  area of encephalomalacia involving the entire left temple region. There is reduced signal involving the June new of the corpus callosum bilaterally more on the left side. Similar findings are noted the deep white matter structure on the right side adjacent to the lateral ventricle involving the centrum semiovale. This associated with mass effect and likely due to cytotoxic edema. There is evidence of enlarged right temporal horn. No clear evidence of acute hydrocephalus and change from previous MRI scan which shows similar findings. The MRI done a few days ago is also reviewed in person in in their seemed low change in both scans.    Morton Simson A. Merlene Laughter, M.D.  Diplomate, Tax adviser of Psychiatry and Neurology ( Neurology). 11/23/2019, 5:51 PM

## 2019-11-23 NOTE — Telephone Encounter (Signed)
Patients mother Gregory Moreno) called to report AJ having a very restless night.  States he was distressed in a manner that he normally is with having to have a bowel movement, however in this case it persisted and also involved shaking and heavy breathing.  This has went on for about and hour and half.  Upon return call to mother, she states they had already called 911 since they feel like it might be a seizure.  Advised mother that I would let Dr. [name] know.

## 2019-11-23 NOTE — ED Triage Notes (Signed)
Pt arrives via [facility] from home. EMS called to residence for altered mental status. Upon arrival pt had decreased LOC, HR 177. Family reports pt had what "looked like a seizure" prior to EMS arrival. Pt received 1g Rocephin IM en route.

## 2019-11-23 NOTE — Sedation Documentation (Signed)
Patient tolerated fluro lumbar puncture procedure well and CSF fluid collected and sent to lab for processing. Patient returned to ED room at this time and mother present at bedside.

## 2019-11-23 NOTE — Procedures (Signed)
CLINICAL DATA: Fever and worsening mental status.  History of brain tumor with prior resection.  EXAM:  DIAGNOSTIC LUMBAR PUNCTURE UNDER FLUOROSCOPIC GUIDANCE  FLUOROSCOPY TIME: Fluoroscopy Time:  0 minutes, 24 seconds  Radiation Exposure Index (if provided by the fluoroscopic device):  3.2 mGy  Number of Acquired Spot Images: 0  PROCEDURE: The patient is not able to consent for himself due to altered mental status.  I discussed the risks (including hemorrhage, infection, headache, and nerve damage, among others), benefits, and alternatives to fluoroscopically guided lumbar puncture with the patient's mother, who has healthcare power of attorney.  We specifically discussed the high technical likelihood of success of the procedure. The patient's mother understood and elected for the patient to undergo the procedure.    Standard time-out was employed.  Following sterile skin prep and local anesthetic administration consisting of 1 percent lidocaine, a 22 gauge spinal needle was advanced without difficulty into the thecal sac at the at the L4-5 level under fluoroscopic guidance.  Very slightly yellowish tinged CSF was returned.  Because of issues with patient agitation, I did not consider it feasible to turn the patient onto his side to obtain an opening pressure measurement.  However, CSF flow was mildly sluggish and subjectively I do not feel that there is elevated CSF pressure.   12 cc of CSF was collected.  The needle was subsequently removed and the skin cleansed and bandaged.  No immediate complications were observed.    IMPRESSION:   Technically successful fluoroscopically guided lumbar puncture yielding 12 cc of CSF.

## 2019-11-23 NOTE — Progress Notes (Signed)
   11/23/19 2132  Assess: MEWS Score  Temp 98.2 F (36.8 C)  BP (!) 90/44  Pulse Rate 87  Resp 16  SpO2 98 %  O2 Device Room Air  O2 Flow Rate (L/min) 2 L/min  Assess: MEWS Score  MEWS Temp 0  MEWS Systolic 1  MEWS Pulse 0  MEWS RR 0  MEWS LOC 2  MEWS Score 3  MEWS Score Color Yellow  Assess: if the MEWS score is Yellow or Red  Were vital signs taken at a resting state? Yes  Focused Assessment No change from prior assessment  Early Detection of Sepsis Score *See Row Information* Low  MEWS guidelines implemented *See Row Information* No, previously yellow, continue vital signs every 4 hours  Treat  MEWS Interventions Administered scheduled meds/treatments  Pain Scale Faces  Pain Score 0  Faces Pain Scale 0  Breathing 0  Negative Vocalization 0  Facial Expression 0  Body Language 0  Consolability 0  PAINAD Score 0  Take Vital Signs  Increase Vital Sign Frequency  Yellow: Q 2hr X 2 then Q 4hr X 2, if remains yellow, continue Q 4hrs  Escalate  MEWS: Escalate Yellow: discuss with charge nurse/RN and consider discussing with provider and RRT  Notify: Charge Nurse/RN  Name of Charge Nurse/RN Notified Larene Beach  Date Charge Nurse/RN Notified 11/23/19  Time Charge Nurse/RN Notified 2140  Document  Patient Outcome Other (Comment) (stable)  Progress note created (see row info) Yes

## 2019-11-23 NOTE — ED Notes (Signed)
Date and time results received: 11/23/19 1334 (use smartphrase ".now" to insert current time)  Test: Lactic Acid Critical Value: 2.6 Name of Provider Notified: Dr Sedonia Small Orders Received? Or Actions Taken?:NA

## 2019-11-23 NOTE — Progress Notes (Signed)
EEG Completed; Results Pending  

## 2019-11-23 NOTE — Progress Notes (Addendum)
Pharmacy Antibiotic Note  Gregory Moreno is a 24 y.o. male admitted on 11/23/2019 with possible CNS infection.  Pharmacy has been consulted for vancomcyin dosing.  Plan:  Give vancomycin 2g  IV x1 dose, then start vancomycin 1g IV  q8h Goal vancomycin trough range:  15-20  Mcg/mL (AUC calculator unavailable) Pharmacy will continue to monitor renal function, vancomycin troughs as clinically appropriate,  cultures and patient progress.  Height: 5\' 9"  (175.3 cm) Weight: 79.2 kg (174 lb 9.7 oz) IBW/kg (Calculated) : 70.7  Temp (24hrs), Avg:100.8 F (38.2 C), Min:100.8 F (38.2 C), Max:100.8 F (38.2 C)  Recent Labs  Lab 11/23/19 0948  WBC 13.6*  CREATININE 0.90  LATICACIDVEN 2.4*    Estimated Creatinine Clearance: 127.7 mL/min (by C-G formula based on SCr of 0.9 mg/dL).    No Known Allergies  Antimicrobials this admission: vancomycin 11/8>>      Microbiology results: 11/8  [facility] x2:   11/8 Resp PCR: SARS CoV-2 negative; Flu A/B negative   Thank you for allowing pharmacy to be a part of this patient's care.  Despina Pole 11/23/2019 12:53 PM

## 2019-11-23 NOTE — H&P (Signed)
History and Physical    DORR PERROT LGX:[id number] DOB: Sep 16, 1995 DOA: 11/23/2019  PCP: [place], [place]   Patient coming from: Home  Chief Complaint: Altered mentation  HPI: Gregory Moreno is a 24 y.o. 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 Mother at bedside is the primary historian who states that the patient became flushed to his face earlier this morning at approximately 0630 and started behaving quite abnormally for him. Unfortunately, given his history of glioblastoma he has poor baseline level of functioning and does not adequately communicate verbally and therefore, further history cannot be obtained.   ED Course: Vital signs demonstrating temperature 100.8 F, but otherwise stable with some softer blood pressure readings noted. Leukocytosis of 13,600 and potassium 3.4. Platelet count of 143 and lactic acid of 2.4. LP was attempted in the ED, but fluid could not be obtained and therefore LP under fluoroscopy ordered and pending. CT of the head with no new findings noted from comparison to prior imaging studies. Patient appears quite somnolent at this time and is currently on 1.5 L nasal cannula oxygen.  Review of Systems: Cannot be obtained given patient condition.  History reviewed. No pertinent past medical history.  Past Surgical History:  Procedure Laterality Date  . CRANIOTOMY Left 10/13/2018   Procedure: LEFT CRANIOTOMY FOR TUMOR RESECTION;  Surgeon: Judith Part, MD;  Location: [facility];  Service: Neurosurgery;  Laterality: Left;     reports that he has never smoked. He has never used smokeless tobacco. No history on file for alcohol use and drug use.  No Known Allergies  No family history on  file.  Prior to Admission medications   Medication Sig Start Date End Date Taking? Authorizing Provider  Baclofen 5 MG TABS Take 1 tablet by mouth daily as needed.  12/25/18  Yes [provider]  camphor-menthol Timoteo Ace) lotion Apply 1 application topically as needed for itching. 06/04/19  Yes Raulkar, Clide Deutscher, MD  dexamethasone (DECADRON) 4 MG tablet Take 1 tablet (4 mg total) by mouth daily. 11/02/19  Yes Vaslow, Acey Lav, MD  dronabinol (MARINOL) 5 MG capsule Take 1 capsule (5 mg total) by mouth 2 (two) times daily before lunch and supper. 10/03/19  Yes Lane Hacker L, DO  escitalopram (LEXAPRO) 5 MG tablet Take 1 tablet (5 mg total) by mouth at bedtime. 10/15/19  Yes Vaslow, Acey Lav, MD  levETIRAcetam (KEPPRA) 250 MG tablet Take 1 tablet (250 mg total) by mouth 2 (two) times daily. 09/24/19  Yes Vaslow, Acey Lav, MD  ondansetron (ZOFRAN) 4 MG tablet Take 1 tablet (4 mg total) by mouth every 8 (eight) hours as needed for nausea or vomiting. Patient taking differently: Take 4 mg by mouth in the morning and at bedtime.  11/02/19  Yes Vaslow, Acey Lav, MD  pantoprazole (PROTONIX) 40 MG tablet Take 1 tablet (40 mg total) by mouth daily. 11/16/19  Yes Vaslow, Acey Lav, MD  senna (SENOKOT) 8.6 MG tablet Take 2 tablets by mouth daily as needed for constipation.    Yes [provider]  temozolomide (TEMODAR) 100 MG capsule TAKE 1 CAPSULE BY MOUTH ONCE DAILY. MAY TAKE ON EMPTY STOMACH TO DECREASE NAUSEA AND VOMITING Patient taking differently: Take 100 mg by mouth daily.  11/20/19  Yes Cecil Cobbs  K, MD  traMADol (ULTRAM) 50 MG tablet Take 1 tablet (50 mg total) by mouth every 6 (six) hours as needed for severe pain. 11/16/19  Yes Vaslow, Acey Lav, MD  escitalopram (LEXAPRO) 5 MG tablet TAKE 1 TABLET BY MOUTH EVERYDAY AT BEDTIME 02/20/19   Izora Ribas, MD    Physical Exam: Vitals:   11/23/19 1130 11/23/19 1305 11/23/19 1311 11/23/19 1330  BP: 105/73 99/66  98/68   Pulse: 92 83  81  Resp: 15 14  17   Temp:   98 F (36.7 C)   TempSrc:   Axillary   SpO2: 100% 95%  97%  Weight:      Height:        Constitutional: NAD, somnolent Vitals:   11/23/19 1130 11/23/19 1305 11/23/19 1311 11/23/19 1330  BP: 105/73 99/66  98/68  Pulse: 92 83  81  Resp: 15 14  17   Temp:   98 F (36.7 C)   TempSrc:   Axillary   SpO2: 100% 95%  97%  Weight:      Height:       Eyes: lids and conjunctivae normal ENMT: Not assessed. Neck: normal, supple Respiratory: clear to auscultation bilaterally. Normal respiratory effort. No accessory muscle use. Currently with 1.5 L nasal cannula supplementation. Cardiovascular: Regular rate and rhythm, no murmurs. No extremity edema. Abdomen: no tenderness, no distention. Bowel sounds positive.  Musculoskeletal:  No joint deformity upper and lower extremities.   Skin: no rashes, lesions, ulcers.  Psychiatric: Cannot be adequately assessed.  Labs on Admission: I have personally reviewed following labs and imaging studies  CBC: Recent Labs  Lab 11/23/19 0948  WBC 13.6*  NEUTROABS 11.7*  HGB 15.1  HCT 44.6  MCV 90.8  PLT 381*   Basic Metabolic Panel: Recent Labs  Lab 11/23/19 0948  NA 138  K 3.4*  CL 102  CO2 25  GLUCOSE 97  BUN 20  CREATININE 0.90  CALCIUM 9.1   GFR: Estimated Creatinine Clearance: 127.7 mL/min (by C-G formula based on SCr of 0.9 mg/dL). Liver Function Tests: Recent Labs  Lab 11/23/19 0948  AST 24  ALT 28  ALKPHOS 42  BILITOT 0.8  PROT 6.6  ALBUMIN 3.9   No results for input(s): LIPASE, AMYLASE in the last 168 hours. No results for input(s): AMMONIA in the last 168 hours. Coagulation Profile: Recent Labs  Lab 11/23/19 0948  INR 1.0   Cardiac Enzymes: No results for input(s): CKTOTAL, CKMB, CKMBINDEX, TROPONINI in the last 168 hours. BNP (last 3 results) No results for input(s): PROBNP in the last 8760 hours. HbA1C: No results for input(s): HGBA1C in the last 72  hours. CBG: No results for input(s): GLUCAP in the last 168 hours. Lipid Profile: No results for input(s): CHOL, HDL, LDLCALC, TRIG, CHOLHDL, LDLDIRECT in the last 72 hours. Thyroid Function Tests: No results for input(s): TSH, T4TOTAL, FREET4, T3FREE, THYROIDAB in the last 72 hours. Anemia Panel: No results for input(s): VITAMINB12, FOLATE, FERRITIN, TIBC, IRON, RETICCTPCT in the last 72 hours. Urine analysis:    Component Value Date/Time   COLORURINE YELLOW 11/23/2019 [place] 11/23/2019 0924   LABSPEC 1.021 11/23/2019 0924   PHURINE 5.0 11/23/2019 0924   GLUCOSEU NEGATIVE 11/23/2019 0924   HGBUR NEGATIVE 11/23/2019 0924   BILIRUBINUR NEGATIVE 11/23/2019 0924   KETONESUR NEGATIVE 11/23/2019 0924   PROTEINUR 30 (A) 11/23/2019 0924   NITRITE NEGATIVE 11/23/2019 0924   LEUKOCYTESUR NEGATIVE 11/23/2019 0924    Radiological Exams on  Admission: CT HEAD WO CONTRAST  Result Date: 11/23/2019 CLINICAL DATA:  Delirium. Altered mental status with seizure-like activity. EXAM: CT HEAD WITHOUT CONTRAST TECHNIQUE: Contiguous axial images were obtained from the base of the skull through the vertex without intravenous contrast. COMPARISON:  MRI November 19, 2019. FINDINGS: Brain: The mass involving the right frontal white matter, corpus callosum, and right temporal lobe was better characterized on recent MRI with contrast, but appears grossly similar. Grossly similar cystic encephalomalacia in the left temporal lobe and edema in the left frontal lobe. Similar rounding of the right temporal horn, likely represent a component of ventricular entrapment. Similar ventricular size. No acute hemorrhage. Absence of contrast precludes evaluation for IAC and brainstem finding seen on prior MRI. Vascular: No hyperdense vessel or unexpected calcification. Skull: Left craniotomy. Sinuses/Orbits: No acute findings. Other: No mastoid effusions. IMPRESSION: 1. The mass involving the right frontal white  matter, corpus callosum, and right temporal lobe appears grossly similar when comparing across modalities. MRI with contrast could better evaluate for progression. 2. Similar rounding of the right temporal horn, likely representing a component of ventricular entrapment. No progressive ventriculomegaly. 3. No specific evidence of new/acute superimposed abnormality. Electronically Signed   By: Margaretha Sheffield MD   On: 11/23/2019 10:21   DG Chest Port 1 View  Result Date: 11/23/2019 CLINICAL DATA:  Altered mental status. EXAM: PORTABLE CHEST 1 VIEW COMPARISON:  Prior chest radiographs 10/15/2018 and earlier. FINDINGS: Shallow inspiration radiograph. Heart size within normal limits. No appreciable airspace consolidation or pulmonary edema. No evidence of pleural effusion or pneumothorax. No acute bony abnormality identified. A thoracolumbar levocurvature may be positional. IMPRESSION: No evidence of acute cardiopulmonary abnormality. Electronically Signed   By: Kellie Simmering DO   On: 11/23/2019 09:48    EKG: Independently reviewed. SR 96bpm.  Assessment/Plan Active Problems:   Acute encephalopathy    Acute encephalopathy-in setting of fever -In setting of left temporal glioblastoma -Suspect infectious etiology such as meningitis/encephalitis versus possible seizure -CT head with no acute findings noted -Recent brain MRI 11/4 with increased findings to the right corpus callosum as well as right temporal lobe regions despite ongoing cancer treatment -CSF evaluation with plan for diagnostic LP under fluoroscopy since this could not be collected in ED -Plan to start on empiric treatment until results obtained -Appreciate neurology assistance and evaluation -Plan to keep on IV fluid until tolerating diet  Prior history of seizure -No seizure-like activity noted at home -Remains compliant with home Keppra use -Plan to start IV Keppra while here -Seizure precautions and Ativan as needed -Obtain  EEG  Mild hypokalemia -Replete  Thrombocytopenia -Likely in setting of ongoing chemotherapy -Monitor CBC -Avoid heparin agents   DVT prophylaxis: SCDs Code Status: DNR Family Communication: Mother at bedside Disposition Plan:Admit for evaluation of meningitis/seizure Consults called:Neurology Admission status: Inpatient, Tele   Maeson Lourenco D Manuella Ghazi DO [facility]  If 7PM-7AM, please contact night-coverage [email or web address]  11/23/2019, 2:16 PM

## 2019-11-24 DIAGNOSIS — R4182 Altered mental status, unspecified: Secondary | ICD-10-CM

## 2019-11-24 LAB — CBC
HCT: 40.7 % (ref 39.0–52.0)
Hemoglobin: 13.1 g/dL (ref 13.0–17.0)
MCH: 30.4 pg (ref 26.0–34.0)
MCHC: 32.2 g/dL (ref 30.0–36.0)
MCV: 94.4 fL (ref 80.0–100.0)
Platelets: 123 10*3/uL — ABNORMAL LOW (ref 150–400)
RBC: 4.31 MIL/uL (ref 4.22–5.81)
RDW: 15.9 % — ABNORMAL HIGH (ref 11.5–15.5)
WBC: 8.1 10*3/uL (ref 4.0–10.5)
nRBC: 0 % (ref 0.0–0.2)

## 2019-11-24 LAB — BASIC METABOLIC PANEL
Anion gap: 10 (ref 5–15)
BUN: 14 mg/dL (ref 6–20)
CO2: 26 mmol/L (ref 22–32)
Calcium: 8.7 mg/dL — ABNORMAL LOW (ref 8.9–10.3)
Chloride: 102 mmol/L (ref 98–111)
Creatinine, Ser: 0.72 mg/dL (ref 0.61–1.24)
GFR, Estimated: >60 mL/min (ref >60)
Glucose, Bld: 88 mg/dL (ref 70–99)
Potassium: 3.6 mmol/L (ref 3.5–5.1)
Sodium: 138 mmol/L (ref 135–145)

## 2019-11-24 LAB — GLUCOSE, CAPILLARY
Glucose-Capillary: 87 mg/dL (ref 70–99)
Glucose-Capillary: 124 mg/dL — ABNORMAL HIGH (ref 70–99)

## 2019-11-24 LAB — MAGNESIUM: Magnesium: 1.9 mg/dL (ref 1.7–2.4)

## 2019-11-24 MED ORDER — ACYCLOVIR SODIUM 50 MG/ML IV SOLN
INTRAVENOUS | Status: AC
  Filled 2019-11-24: qty 10

## 2019-11-24 MED ORDER — KCL IN DEXTROSE-NACL 20-5-0.45 MEQ/L-%-% IV SOLN: INTRAVENOUS | Status: DC

## 2019-11-24 MED ORDER — LORAZEPAM 2 MG/ML IJ SOLN
2.0000 mg | Freq: Once | INTRAMUSCULAR | Status: AC
  Administered 2019-11-24: 2 mg via INTRAVENOUS

## 2019-11-24 MED ORDER — FAMOTIDINE IN NACL 20-0.9 MG/50ML-% IV SOLN
20.0000 mg | INTRAVENOUS | Status: DC
  Administered 2019-11-24 – 2019-11-26 (×3): 20 mg via INTRAVENOUS
  Filled 2019-11-24 (×3): qty 50

## 2019-11-24 MED ORDER — ACETAMINOPHEN 650 MG RE SUPP
650.0000 mg | Freq: Once | RECTAL | Status: AC
  Administered 2019-11-24: 650 mg via RECTAL
  Filled 2019-11-24: qty 1

## 2019-11-24 MED ORDER — DEXAMETHASONE SODIUM PHOSPHATE 10 MG/ML IJ SOLN
10.0000 mg | INTRAMUSCULAR | Status: DC
  Administered 2019-11-24 – 2019-11-26 (×3): 10 mg via INTRAVENOUS
  Filled 2019-11-24 (×3): qty 1

## 2019-11-24 MED ORDER — LEVETIRACETAM IN NACL 1500 MG/100ML IV SOLN
1500.0000 mg | Freq: Two times a day (BID) | INTRAVENOUS | Status: DC
  Administered 2019-11-25 – 2019-11-26 (×4): 1500 mg via INTRAVENOUS
  Filled 2019-11-24 (×4): qty 100

## 2019-11-24 NOTE — Progress Notes (Signed)
Patient was alert but mostly nonverbal when brought from ED. Could answer questions with just short one work answers, if at all.  Seemed unable to follow commands except for weak grip with left hand.   Sacral dressing placed to sacrum and condom cath replaced along with scd's.  Bed pads applied to railing. Able to swallow pills and drank some water through straw.  At around 0100 began to make loud noises and upon entering room was agitated and moving right arm as if trying to remove condom cath and right leg seemed to be shaking involuntarily.  Unable to verbalized what was wrong.  Ativan prn given but seemed to calm down even before that was given.

## 2019-11-24 NOTE — Progress Notes (Addendum)
[place] A. Merlene Laughter, MD     [email or web address]          Gregory Moreno is an 24 y.o. male.   ASSESSMENT/PLAN: 1. Acute encephalopathy: Etiology includes primary CNS infection such as meningitis or encephalitis, seizures and the progression of glioblastoma.  EEG has been unremarkable for seizure activity but clearly has focal findings.  Keppra will be increased to also cover for any ongoing seizure activity not seen on EEG.  We will continue with antibiotics for now.  Dexamethasone has been switched to IV. 2. Glioblastoma multiforme with recent recurrence 3. 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  The patient had gotten better after his initial treatment.  He was able to ambulate and feed himself.  However he started getting worse in August with imaging showing recurrence and progression of his tumor.  He apparently has these episodes of restlessness after he eats and with bowel movements.  Yesterday however he had extensive restlessness.  Today he has been restless once but mostly has been unresponsive which is unusual for the patient.  He has had some episodes of jerking of the legs especially on the right side most consistent with myoclonus.  Again, no clear seizure activities been reported.  He did have 1 episode of prolonged unresponsiveness a couple months ago while he was sleeping.  This was thought to be due to seizures.  GENERAL: This is a mildly overweight male who is laying unresponsive in bed.  HEENT: Neck is most is supple although there is some axial rigidity noted episodically. No trauma noted.  ABDOMEN: Soft  EXTREMITIES: No edema   BACK: Normal alignment.  SKIN: Normal by inspection.    MENTAL STATUS: He lays in bed with eyes closed. He does open his eyes to deep painful stimuli. There is no verbal output and he clearly has a severe global aphasia.  CRANIAL  NERVES: Pupils are equal, round and reactive to light (often quite dilated 8 mm at times); extraocular movements are full - by oculocephalic reflexes, there is no significant nystagmus; upper and lower facial muscles are normal in strength and symmetric, there is no flattening of the nasolabial folds.  MOTOR: He does lift the upper extremity against gravity.  He withdraws purposefully to painful stimuli in all 4 extremities.  The left lower extremity is graded as 2/5.  Left upper extremity 1/5.  Right upper extremity is flexed with markedly increased tone/spasticity in graded as 2/5. The right leg has intermittent increased tone in graded as  2/5.  COORDINATION: No abnormal movements noted at this time. There is no tremors, myoclonus or clues noted. No parkinsonian features.  REFLEXES: Deep tendon reflexes are symmetrical and normal to brisk throughout. Plantar responses are upgoing on the right and equivocal on the left.   SENSATION: Grimaces to deep painful stimuli bilaterally.    Blood pressure 104/63, pulse (!) 110, temperature 99.6 F (37.6 C), temperature source Axillary, resp. rate (!) 21, height 5\' 9"  (1.753 m), weight 78.8 kg, SpO2 99 %.  Past Medical History:  Diagnosis Date  . Glioblastoma [facility])     Past Surgical History:  Procedure Laterality Date  . CRANIOTOMY Left 10/13/2018   Procedure: LEFT CRANIOTOMY FOR TUMOR RESECTION;  Surgeon: Judith Part, MD;  Location: [place];  Service: Neurosurgery;  Laterality: Left;    No family history on file.  Social History:  reports that he has never  smoked. He has never used smokeless tobacco. No history on file for alcohol use and drug use.  Allergies: No Known Allergies  Medications: Prior to Admission medications   Medication Sig Start Date End Date Taking? Authorizing Provider  Baclofen 5 MG TABS Take 1 tablet by mouth daily as needed.  12/25/18  Yes [provider]  camphor-menthol Timoteo Ace) lotion Apply 1  application topically as needed for itching. 06/04/19  Yes Raulkar, Clide Deutscher, MD  dexamethasone (DECADRON) 4 MG tablet Take 1 tablet (4 mg total) by mouth daily. 11/02/19  Yes Vaslow, Acey Lav, MD  dronabinol (MARINOL) 5 MG capsule Take 1 capsule (5 mg total) by mouth 2 (two) times daily before lunch and supper. 10/03/19  Yes Lane Hacker L, DO  escitalopram (LEXAPRO) 5 MG tablet Take 1 tablet (5 mg total) by mouth at bedtime. 10/15/19  Yes Vaslow, Acey Lav, MD  levETIRAcetam (KEPPRA) 250 MG tablet Take 1 tablet (250 mg total) by mouth 2 (two) times daily. 09/24/19  Yes Vaslow, Acey Lav, MD  ondansetron (ZOFRAN) 4 MG tablet Take 1 tablet (4 mg total) by mouth every 8 (eight) hours as needed for nausea or vomiting. Patient taking differently: Take 4 mg by mouth in the morning and at bedtime.  11/02/19  Yes Vaslow, Acey Lav, MD  pantoprazole (PROTONIX) 40 MG tablet Take 1 tablet (40 mg total) by mouth daily. 11/16/19  Yes Vaslow, Acey Lav, MD  senna (SENOKOT) 8.6 MG tablet Take 2 tablets by mouth daily as needed for constipation.    Yes [provider]  temozolomide (TEMODAR) 100 MG capsule TAKE 1 CAPSULE BY MOUTH ONCE DAILY. MAY TAKE ON EMPTY STOMACH TO DECREASE NAUSEA AND VOMITING Patient taking differently: Take 100 mg by mouth daily.  11/20/19  Yes Vaslow, Acey Lav, MD  traMADol (ULTRAM) 50 MG tablet Take 1 tablet (50 mg total) by mouth every 6 (six) hours as needed for severe pain. 11/16/19  Yes Vaslow, Acey Lav, MD  escitalopram (LEXAPRO) 5 MG tablet TAKE 1 TABLET BY MOUTH EVERYDAY AT BEDTIME 02/20/19   Raulkar, Clide Deutscher, MD    Scheduled Meds: . dexamethasone (DECADRON) injection  10 mg Intravenous Q24H  . escitalopram  5 mg Oral QHS  . pantoprazole  40 mg Oral Daily   Continuous Infusions: . sodium chloride 500 mL (11/23/19 2252)  . acyclovir 790 mg (11/24/19 1544)  . cefTRIAXone (ROCEPHIN)  IV 2 g (11/24/19 1630)  . dextrose 5 % and 0.45 % NaCl with KCl 20 mEq/L 125 mL/hr  at 11/24/19 1011  . famotidine (PEPCID) IV 20 mg (11/24/19 1010)  . lactated ringers 75 mL/hr at 11/24/19 0611  . levETIRAcetam 500 mg (11/24/19 1458)  . vancomycin 1,000 mg (11/24/19 1152)   PRN Meds:.sodium chloride, acetaminophen **OR** acetaminophen, camphor-menthol, LORazepam, ondansetron **OR** ondansetron (ZOFRAN) IV     Results for orders placed or performed during the hospital encounter of 11/23/19 (from the past 48 hour(s))  Culture, blood (Routine x 2)     Status: None (Preliminary result)   Collection Time: 11/23/19  9:20 AM   Specimen: BLOOD LEFT HAND  Result Value Ref Range   Specimen Description BLOOD LEFT HAND    Special Requests      BOTTLES DRAWN AEROBIC AND ANAEROBIC Blood Culture adequate volume   Culture      NO GROWTH < 24 HOURS Performed at [facility], [address] [place], [place] [address]    Report Status PENDING   Urinalysis, Routine w reflex  microscopic Urine, Catheterized     Status: Abnormal   Collection Time: 11/23/19  9:24 AM  Result Value Ref Range   Color, Urine YELLOW YELLOW   APPearance CLEAR CLEAR   Specific Gravity, Urine 1.021 1.005 - 1.030   pH 5.0 5.0 - 8.0   Glucose, UA NEGATIVE NEGATIVE mg/dL   Hgb urine dipstick NEGATIVE NEGATIVE   Bilirubin Urine NEGATIVE NEGATIVE   Ketones, ur NEGATIVE NEGATIVE mg/dL   Protein, ur 30 (A) NEGATIVE mg/dL   Nitrite NEGATIVE NEGATIVE   Leukocytes,Ua NEGATIVE NEGATIVE   RBC / HPF 0-5 0 - 5 RBC/hpf   WBC, UA 0-5 0 - 5 WBC/hpf   Bacteria, UA NONE SEEN NONE SEEN   Squamous Epithelial / LPF 0-5 0 - 5   Mucus PRESENT    Hyaline Casts, UA PRESENT     Comment: Performed at [facility], [address], [place], [place] [address]  Comprehensive metabolic panel     Status: Abnormal   Collection Time: 11/23/19  9:48 AM  Result Value Ref Range   Sodium 138 135 - 145 mmol/L   Potassium 3.4 (L) 3.5 - 5.1 mmol/L   Chloride 102 98 - 111 mmol/L   CO2 25 22 - 32 mmol/L   Glucose, Bld 97 70 - 99 mg/dL     Comment: Glucose reference range applies only to samples taken after fasting for at least 8 hours.   BUN 20 6 - 20 mg/dL   Creatinine, Ser 0.90 0.61 - 1.24 mg/dL   Calcium 9.1 8.9 - 10.3 mg/dL   Total Protein 6.6 6.5 - 8.1 g/dL   Albumin 3.9 3.5 - 5.0 g/dL   AST 24 15 - 41 U/L   ALT 28 0 - 44 U/L   Alkaline Phosphatase 42 38 - 126 U/L   Total Bilirubin 0.8 0.3 - 1.2 mg/dL   GFR, Estimated >60 >60 mL/min    Comment: (NOTE) Calculated using the CKD-EPI Creatinine Equation (2021)    Anion gap 11 5 - 15    Comment: Performed at [facility], [address], [place], [place] [address]  Lactic acid, plasma     Status: Abnormal   Collection Time: 11/23/19  9:48 AM  Result Value Ref Range   Lactic Acid, Venous 2.4 (HH) 0.5 - 1.9 mmol/L    Comment: CRITICAL RESULT CALLED TO, READ BACK BY AND VERIFIED WITH: DOSS,M AT 10:10AM ON 11/23/19 BY [facility] Performed at [facility], [address] [place], [place] [address]   CBC with Differential     Status: Abnormal   Collection Time: 11/23/19  9:48 AM  Result Value Ref Range   WBC 13.6 (H) 4.0 - 10.5 K/uL   RBC 4.91 4.22 - 5.81 MIL/uL   Hemoglobin 15.1 13.0 - 17.0 g/dL   HCT 44.6 39 - 52 %   MCV 90.8 80.0 - 100.0 fL   MCH 30.8 26.0 - 34.0 pg   MCHC 33.9 30.0 - 36.0 g/dL   RDW 16.0 (H) 11.5 - 15.5 %   Platelets 143 (L) 150 - 400 K/uL   nRBC 0.0 0.0 - 0.2 %   Neutrophils Relative % 87 %   Neutro Abs 11.7 (H) 1.7 - 7.7 K/uL   Lymphocytes Relative 4 %   Lymphs Abs 0.5 (L) 0.7 - 4.0 K/uL   Monocytes Relative 7 %   Monocytes Absolute 1.0 0.1 - 1.0 K/uL   Eosinophils Relative 0 %   Eosinophils Absolute 0.0 0.0 - 0.5 K/uL  Basophils Relative 0 %   Basophils Absolute 0.1 0.0 - 0.1 K/uL   Immature Granulocytes 2 %   Abs Immature Granulocytes 0.32 (H) 0.00 - 0.07 K/uL    Comment: Performed at [facility], [address] [place], [place] [address]  Protime-INR     Status: None   Collection Time: 11/23/19  9:48 AM  Result Value Ref Range    Prothrombin Time 12.6 11.4 - 15.2 seconds   INR 1.0 0.8 - 1.2    Comment: (NOTE) INR goal varies based on device and disease states. Performed at [facility], [address] [place], [place] [address]   Respiratory Panel by RT PCR (Flu A&B, Covid) - Nasopharyngeal Swab     Status: None   Collection Time: 11/23/19  9:48 AM   Specimen: Nasopharyngeal Swab  Result Value Ref Range   SARS Coronavirus 2 by RT PCR NEGATIVE NEGATIVE    Comment: (NOTE) SARS-CoV-2 target nucleic acids are NOT DETECTED.  The SARS-CoV-2 RNA is generally detectable in upper respiratoy specimens during the acute phase of infection. The lowest concentration of SARS-CoV-2 viral copies this assay can detect is 131 copies/mL. A negative result does not preclude SARS-Cov-2 infection and should not be used as the sole basis for treatment or other patient management decisions. A negative result may occur with  improper specimen collection/handling, submission of specimen other than nasopharyngeal swab, presence of viral mutation(s) within the areas targeted by this assay, and inadequate number of viral copies (<131 copies/mL). A negative result must be combined with clinical observations, patient history, and epidemiological information. The expected result is Negative.  Fact Sheet for Patients:  PinkCheek.be  Fact Sheet for Healthcare Providers:  GravelBags.it  This test is no t yet approved or cleared by the [place] FDA and  has been authorized for detection and/or diagnosis of SARS-CoV-2 by FDA under an Emergency Use Authorization (EUA). This EUA will remain  in effect (meaning this test can be used) for the duration of the COVID-19 declaration under Section 564(b)(1) of the Act, 21 U.S.C. section 360bbb-3(b)(1), unless the authorization is terminated or revoked sooner.     Influenza A by PCR NEGATIVE NEGATIVE   Influenza B by PCR NEGATIVE  NEGATIVE    Comment: (NOTE) The Xpert Xpress SARS-CoV-2/FLU/RSV assay is intended as an aid in  the diagnosis of influenza from Nasopharyngeal swab specimens and  should not be used as a sole basis for treatment. Nasal washings and  aspirates are unacceptable for Xpert Xpress SARS-CoV-2/FLU/RSV  testing.  Fact Sheet for Patients: PinkCheek.be  Fact Sheet for Healthcare Providers: GravelBags.it  This test is not yet approved or cleared by the [place] FDA and  has been authorized for detection and/or diagnosis of SARS-CoV-2 by  FDA under an Emergency Use Authorization (EUA). This EUA will remain  in effect (meaning this test can be used) for the duration of the  Covid-19 declaration under Section 564(b)(1) of the Act, 21  U.S.C. section 360bbb-3(b)(1), unless the authorization is  terminated or revoked. Performed at [facility], [address], [place], [place] [address]   Culture, blood (Routine x 2)     Status: None (Preliminary result)   Collection Time: 11/23/19 10:17 AM   Specimen: BLOOD  Result Value Ref Range   Specimen Description BLOOD RIGHT ANTECUBITAL    Special Requests      BOTTLES DRAWN AEROBIC AND ANAEROBIC Blood Culture adequate volume   Culture      NO GROWTH < 24  HOURS Performed at [facility], [address] [place], [place] [address]    Report Status PENDING   Lactic acid, plasma     Status: Abnormal   Collection Time: 11/23/19 12:43 PM  Result Value Ref Range   Lactic Acid, Venous 2.6 (HH) 0.5 - 1.9 mmol/L    Comment: CRITICAL RESULT CALLED TO, READ BACK BY AND VERIFIED WITH: HEATHER CRAWFORD,RN @1331  11/23/2019 KAY Performed at [facility], [address] [place], [place] [address]   CSF cell count with differential     Status: Abnormal   Collection Time: 11/23/19  2:32 PM  Result Value Ref Range   Tube # 4    Color, CSF COLORLESS COLORLESS   Appearance, CSF CLEAR CLEAR   Supernatant  CLEAR    RBC Count, CSF 138 (H) 0 /cu mm   WBC, CSF 10 (H) 0 - 5 /cu mm   Other Cells, CSF TOO FEW TO COUNT, SMEAR AVAILABLE FOR REVIEW     Comment: Performed at [facility], [address] [place], [place] [address]  CSF culture with Stat gram stain     Status: None (Preliminary result)   Collection Time: 11/23/19  2:32 PM   Specimen: CSF; Cerebrospinal Fluid  Result Value Ref Range   Specimen Description      CSF Performed at [facility], [address], [place], [place] [address]    Special Requests      Immunocompromised Performed at [facility], [address] [place], [place] [address]    Gram Stain      NO WBC SEEN NO ORGANISMS SEEN CYTOSPIN SMEAR Performed at [facility], [place] [address], [place], [place] [address]    Culture PENDING    Report Status PENDING   Protein and glucose, CSF     Status: Abnormal   Collection Time: 11/23/19  2:33 PM  Result Value Ref Range   Glucose, CSF <20 (LL) 40 - 70 mg/dL    Comment: CRITICAL RESULT CALLED TO, READ BACK BY AND VERIFIED WITH: GANTT,E ON 11/23/19 AT 1635 BY LOY,C    Total  Protein, CSF >600 (H) 15 - 45 mg/dL    Comment: RESULTS CONFIRMED BY MANUAL DILUTION Performed at [facility], [address] [place], [place] [address]   Magnesium     Status: None   Collection Time: 11/24/19  6:38 AM  Result Value Ref Range   Magnesium 1.9 1.7 - 2.4 mg/dL    Comment: Performed at [facility], [address], [place], [place] [address]  Basic metabolic panel     Status: Abnormal   Collection Time: 11/24/19  6:38 AM  Result Value Ref Range   Sodium 138 135 - 145 mmol/L   Potassium 3.6 3.5 - 5.1 mmol/L   Chloride 102 98 - 111 mmol/L   CO2 26 22 - 32 mmol/L   Glucose, Bld 88 70 - 99 mg/dL    Comment: Glucose reference range applies only to samples taken after fasting for at least 8 hours.   BUN 14 6 - 20 mg/dL   Creatinine, Ser 0.72 0.61 - 1.24 mg/dL   Calcium 8.7 (L) 8.9 - 10.3 mg/dL   GFR, Estimated >60 >60 mL/min     Comment: (NOTE) Calculated using the CKD-EPI Creatinine Equation (2021)    Anion gap 10 5 - 15    Comment: Performed at [facility], [address], [place], [place] [address]  CBC     Status: Abnormal   Collection Time:  11/24/19  6:38 AM  Result Value Ref Range   WBC 8.1 4.0 - 10.5 K/uL   RBC 4.31 4.22 - 5.81 MIL/uL   Hemoglobin 13.1 13.0 - 17.0 g/dL   HCT 40.7 39 - 52 %   MCV 94.4 80.0 - 100.0 fL   MCH 30.4 26.0 - 34.0 pg   MCHC 32.2 30.0 - 36.0 g/dL   RDW 15.9 (H) 11.5 - 15.5 %   Platelets 123 (L) 150 - 400 K/uL   nRBC 0.0 0.0 - 0.2 %    Comment: Performed at [facility], [address], [place], [place] [address]  Glucose, capillary     Status: None   Collection Time: 11/24/19 11:01 AM  Result Value Ref Range   Glucose-Capillary 87 70 - 99 mg/dL    Comment: Glucose reference range applies only to samples taken after fasting for at least 8 hours.    Studies/Results:  HEAD CT  FINDINGS: Brain: The mass involving the right frontal white matter, corpus callosum, and right temporal lobe was better characterized on recent MRI with contrast, but appears grossly similar. Grossly similar cystic encephalomalacia in the left temporal lobe and edema in the left frontal lobe. Similar rounding of the right temporal horn, likely represent a component of ventricular entrapment. Similar ventricular size. No acute hemorrhage. Absence of contrast precludes evaluation for IAC and brainstem finding seen on prior MRI.  Vascular: No hyperdense vessel or unexpected calcification.  Skull: Left craniotomy.  Sinuses/Orbits: No acute findings.  Other: No mastoid effusions.  IMPRESSION: 1. The mass involving the right frontal white matter, corpus callosum, and right temporal lobe appears grossly similar when comparing across modalities. MRI with contrast could better evaluate for progression. 2. Similar rounding of the right temporal horn, likely representing a component of  ventricular entrapment. No progressive ventriculomegaly. 3. No specific evidence of new/acute superimposed abnormality.    EEG Description: Sleep was characterized by asymmetric sleep spindles (12 to 14 Hz), L>R, maximal frontocentral region.  EEG showed continuous 3 to 6 Hz theta-delta slowing in left hemisphere with overriding sharply contoured 15-18Hz  beta activity in left frontal region consistent with breach rhythm. Hyperventilation and photic stimulation were not performed.     ABNORMALITY -Continuous slow, left hemisphere -Breach rhythm, left frontal region  IMPRESSION: This study showed evidence of cortical dysfunction in left hemisphere, maximal left frontal region consistent with underlying tumor and craniotomy.   No seizures or epileptiform discharges were seen throughout the recording.         The head CT scan is reviewed in person and compared to the brain MRI scan both show a large area of encephalomalacia involving the entire left temple region. There is reduced signal involving the June new of the corpus callosum bilaterally more on the left side. Similar findings are noted the deep white matter structure on the right side adjacent to the lateral ventricle involving the centrum semiovale. This associated with mass effect and likely due to cytotoxic edema. There is evidence of enlarged right temporal horn. No clear evidence of acute hydrocephalus and change from previous MRI scan which shows similar findings. The MRI done a few days ago is also reviewed in person in in their seemed low change in both scans.    Elisa Sorlie A. Merlene Laughter, M.D.  Diplomate, Tax adviser of Psychiatry and Neurology ( Neurology). 11/24/2019, 4:57 PM

## 2019-11-24 NOTE — Progress Notes (Addendum)
Patient Demographics:    Gregory Moreno, is a 24 y.o. male, DOB - 08-06-1995, KDT:[id number]  Admit date - 11/23/2019   Admitting Physician Pratik Darleen Crocker, DO  Outpatient Primary MD for the patient is [place]  LOS - 1   Chief Complaint  Patient presents with  . Altered Mental Status        Subjective:    Gregory Moreno today has no emesis, patient having fevers, -Patient's father is at bedside, patient with lots of clonic type movements, patient largely encephalopathic and not really responsive  Assessment  & Plan :    Principal Problem:   Acute encephalopathy Active Problems:   Right spastic hemiplegia (HCC)   Spastic hemiplegia affecting nondominant side (HCC)   Glioblastoma multiforme of temporal lobe [facility])  Brief Summary:- 24 y.o. 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 Merlene Laughter -Differential diagnosis includes possible progression of underlying glioblastoma Versus seizure disorder versus CNS infection including possible meningitis or encephalitis =-=-WBC is down to 8.1 from 13.6 on admission --Continue Decadron - continue vancomycin and Rocephin and acyclovir pending CNS studies done cultures -Keppra has been adjusted upward --May use as needed IV lorazepam for seizures or restlessness  2) social/ethics--- plan of care discussed with patient's father at bedside patient is a DNR  3)FEN--- patient is too altered for oral  intake at this time give dextrose IV until patient is awake enough to safely take oral intake  Disposition/Need for in-Hospital Stay- patient unable to be discharged at this time due to --possible meningitis/encephalitis requiring IV acyclovir, Rocephin, Decadron and vancomycin pending CSF studies/cultures, seizures requiring IV lorazepam  Status is: Inpatient  Remains inpatient appropriate because:-possible meningitis/encephalitis requiring IV acyclovir, Rocephin, Decadron and vancomycin pending CSF studies/cultures, seizures requiring IV lorazepam   Disposition: The patient is from: Home              Anticipated d/c is to: Home              Anticipated d/c date is: > 3 days              Patient currently is not medically stable to d/c. Barriers: Not Clinically Stable- -possible meningitis/encephalitis requiring IV acyclovir, Rocephin, Decadron and vancomycin pending CSF studies/cultures, seizures requiring IV lorazepam  Code Status : Full code  Family Communication:   Discussed with dad at bedside  Consults  :  Neurology  DVT Prophylaxis  :   - SCDs   Lab Results  Component Value Date   PLT 123 (L) 11/24/2019    Inpatient Medications  Scheduled Meds: . dexamethasone (DECADRON) injection  10 mg Intravenous Q24H  . escitalopram  5 mg Oral QHS   Continuous Infusions: . sodium chloride 500 mL (11/23/19 2252)  . acyclovir 790 mg (11/24/19 1544)  . cefTRIAXone (ROCEPHIN)  IV 2 g (11/24/19 1630)  . dextrose 5 % and 0.45 % NaCl with  KCl 20 mEq/L 125 mL/hr at 11/24/19 1011  . famotidine (PEPCID) IV 20 mg (11/24/19 1010)  . [START ON 11/25/2019] levETIRAcetam    . vancomycin 1,000 mg (11/24/19 1152)   PRN Meds:.sodium chloride, acetaminophen **OR** acetaminophen, camphor-menthol, LORazepam, ondansetron **OR** ondansetron (ZOFRAN) IV    Anti-infectives (From admission, onward)   Start     Dose/Rate Route Frequency Ordered Stop   11/23/19 2200  acyclovir (ZOVIRAX) 790 mg in  dextrose 5 % 150 mL IVPB        10 mg/kg  79.2 kg 165.8 mL/hr over 60 Minutes Intravenous Every 8 hours 11/23/19 1938     11/23/19 2000  vancomycin (VANCOCIN) IVPB 1000 mg/200 mL premix        1,000 mg 200 mL/hr over 60 Minutes Intravenous Every 8 hours 11/23/19 1259     11/23/19 1530  cefTRIAXone (ROCEPHIN) 2 g in sodium chloride 0.9 % 100 mL IVPB        2 g 200 mL/hr over 30 Minutes Intravenous Every 12 hours 11/23/19 1520     11/23/19 1145  vancomycin (VANCOREADY) IVPB 2000 mg/400 mL        2,000 mg 200 mL/hr over 120 Minutes Intravenous  Once 11/23/19 1050 11/23/19 1217        Objective:   Vitals:   11/24/19 1447 11/24/19 1600 11/24/19 1700 11/24/19 1800  BP: (!) 145/95 104/63 105/66 104/63  Pulse: (!) 155 (!) 110 (!) 106 (!) 105  Resp: (!) 22 (!) 21 (!) 21 20  Temp: 100.3 F (37.9 C) 99.6 F (37.6 C) 98.8 F (37.1 C) 98.9 F (37.2 C)  TempSrc: Axillary Axillary Axillary Axillary  SpO2: 98% 99% 98% 99%  Weight:      Height:        Wt Readings from Last 3 Encounters:  11/24/19 78.8 kg  11/02/19 79.2 kg  09/30/19 79.4 kg     Intake/Output Summary (Last 24 hours) at 11/24/2019 1843 Last data filed at 11/24/2019 1706 Gross per 24 hour  Intake 1825.95 ml  Output 2675 ml  Net -849.05 ml     Physical Exam  Gen:-Very lethargic, HEENT:- [place].AT, No sclera icterus Neck-Supple Neck,No JVD,.  Lungs-slightly diminished, no wheezing CV- S1, S2 normal, regular, tachycardic Abd-  +ve B.Sounds, Abd Soft, No tenderness,    Extremity/Skin:- No  edema, pedal pulses present  Psych-lethargic and largely unresponsive Neuro-uncontrollable clonic movements, especially on the right side   Data Review:   Micro Results Recent Results (from the past 240 hour(s))  Culture, blood (Routine x 2)     Status: None (Preliminary result)   Collection Time: 11/23/19  9:20 AM   Specimen: BLOOD LEFT HAND  Result Value Ref Range Status   Specimen Description BLOOD LEFT HAND  Final    Special Requests   Final    BOTTLES DRAWN AEROBIC AND ANAEROBIC Blood Culture adequate volume   Culture   Final    NO GROWTH < 24 HOURS Performed at [facility], [address] [place], [place] [address]    Report Status PENDING  Incomplete  Respiratory Panel by RT PCR (Flu A&B, Covid) - Nasopharyngeal Swab     Status: None   Collection Time: 11/23/19  9:48 AM   Specimen: Nasopharyngeal Swab  Result Value Ref Range Status   SARS Coronavirus 2 by RT PCR NEGATIVE NEGATIVE Final    Comment: (NOTE) SARS-CoV-2 target nucleic acids are NOT DETECTED.  The SARS-CoV-2 RNA is generally detectable in upper respiratoy specimens during the  acute phase of infection. The lowest concentration of SARS-CoV-2 viral copies this assay can detect is 131 copies/mL. A negative result does not preclude SARS-Cov-2 infection and should not be used as the sole basis for treatment or other patient management decisions. A negative result may occur with  improper specimen collection/handling, submission of specimen other than nasopharyngeal swab, presence of viral mutation(s) within the areas targeted by this assay, and inadequate number of viral copies (<131 copies/mL). A negative result must be combined with clinical observations, patient history, and epidemiological information. The expected result is Negative.  Fact Sheet for Patients:  PinkCheek.be  Fact Sheet for Healthcare Providers:  GravelBags.it  This test is no t yet approved or cleared by the [place] FDA and  has been authorized for detection and/or diagnosis of SARS-CoV-2 by FDA under an Emergency Use Authorization (EUA). This EUA will remain  in effect (meaning this test can be used) for the duration of the COVID-19 declaration under Section 564(b)(1) of the Act, 21 U.S.C. section 360bbb-3(b)(1), unless the authorization is terminated or revoked sooner.     Influenza A by PCR  NEGATIVE NEGATIVE Final   Influenza B by PCR NEGATIVE NEGATIVE Final    Comment: (NOTE) The Xpert Xpress SARS-CoV-2/FLU/RSV assay is intended as an aid in  the diagnosis of influenza from Nasopharyngeal swab specimens and  should not be used as a sole basis for treatment. Nasal washings and  aspirates are unacceptable for Xpert Xpress SARS-CoV-2/FLU/RSV  testing.  Fact Sheet for Patients: PinkCheek.be  Fact Sheet for Healthcare Providers: GravelBags.it  This test is not yet approved or cleared by the [place] FDA and  has been authorized for detection and/or diagnosis of SARS-CoV-2 by  FDA under an Emergency Use Authorization (EUA). This EUA will remain  in effect (meaning this test can be used) for the duration of the  Covid-19 declaration under Section 564(b)(1) of the Act, 21  U.S.C. section 360bbb-3(b)(1), unless the authorization is  terminated or revoked. Performed at [facility], [address], [place], [place] [address]   Culture, blood (Routine x 2)     Status: None (Preliminary result)   Collection Time: 11/23/19 10:17 AM   Specimen: BLOOD  Result Value Ref Range Status   Specimen Description BLOOD RIGHT ANTECUBITAL  Final   Special Requests   Final    BOTTLES DRAWN AEROBIC AND ANAEROBIC Blood Culture adequate volume   Culture   Final    NO GROWTH < 24 HOURS Performed at [facility], [address] [place], [place] [address]    Report Status PENDING  Incomplete  CSF culture with Stat gram stain     Status: None (Preliminary result)   Collection Time: 11/23/19  2:32 PM   Specimen: CSF; Cerebrospinal Fluid  Result Value Ref Range Status   Specimen Description   Final    CSF Performed at [facility], [address], [place], [place] [address]    Special Requests   Final    Immunocompromised Performed at [facility], [address] [place], [place] [address]    Gram Stain   Final    NO WBC SEEN NO  ORGANISMS SEEN CYTOSPIN SMEAR Performed at [facility], [place] [address] [place], [place] [address]    Culture PENDING  Incomplete   Report Status PENDING  Incomplete    Radiology Reports CT HEAD WO CONTRAST  Result Date: 11/23/2019 CLINICAL DATA:  Delirium. Altered mental status with seizure-like activity. EXAM: CT HEAD WITHOUT  CONTRAST TECHNIQUE: Contiguous axial images were obtained from the base of the skull through the vertex without intravenous contrast. COMPARISON:  MRI November 19, 2019. FINDINGS: Brain: The mass involving the right frontal white matter, corpus callosum, and right temporal lobe was better characterized on recent MRI with contrast, but appears grossly similar. Grossly similar cystic encephalomalacia in the left temporal lobe and edema in the left frontal lobe. Similar rounding of the right temporal horn, likely represent a component of ventricular entrapment. Similar ventricular size. No acute hemorrhage. Absence of contrast precludes evaluation for IAC and brainstem finding seen on prior MRI. Vascular: No hyperdense vessel or unexpected calcification. Skull: Left craniotomy. Sinuses/Orbits: No acute findings. Other: No mastoid effusions. IMPRESSION: 1. The mass involving the right frontal white matter, corpus callosum, and right temporal lobe appears grossly similar when comparing across modalities. MRI with contrast could better evaluate for progression. 2. Similar rounding of the right temporal horn, likely representing a component of ventricular entrapment. No progressive ventriculomegaly. 3. No specific evidence of new/acute superimposed abnormality. Electronically Signed   By: Margaretha Sheffield MD   On: 11/23/2019 10:21   MR Brain W Wo Contrast  Result Date: 11/19/2019 CLINICAL DATA:  Brain/CNS neoplasm, staging. EXAM: MRI HEAD WITHOUT AND WITH CONTRAST TECHNIQUE: Multiplanar, multiecho pulse sequences of the brain and surrounding structures were obtained without and  with intravenous contrast. CONTRAST:  7.45mL GADAVIST GADOBUTROL 1 MMOL/ML IV SOLN COMPARISON:  09/30/2019 MRI head and prior. FINDINGS: Brain: Confluent T2/FLAIR hyperintense signal involving the left cerebrum and bifrontal region traversing the genu is less conspicuous than prior exam. Correlative restricted diffusion involving the left periatrial white matter and corpus callosum is grossly unchanged. Prior irregular enhancement predominantly involving the corpus callosum and left greater than right periventricular white matter has decreased (24:27). Persistent leptomeningeal enhancement involving the anterior brainstem, however less conspicuous than prior exam. Prior leptomeningeal enhancement involving the bilateral IAC's is not demonstrated. Persistent ependymal enhancement involving the right atrium. Masslike confluent T2/FLAIR hyperintense signal involving the right corpus callosum body/splenium, centrum semiovale and right corona radiata is more conspicuous than prior exam measuring up to 5.0 x 3.8 cm (17:19, previously 1.7 cm when remeasured). New micronodular enhancement is also seen within this region (24:34, 35). Associated mild restricted diffusion has increased. Masslike confluent T2 hyperintense signal involving the mesial right temporal lobe/hippocampus is also increased with subtle associated heterogenous enhancement (25:14). Associated restricted diffusion has also increased. Redemonstration of left temporal cystic encephalomalacia. Extension of T2 hyperintense signal along the left brainstem is unchanged, likely wallerian degeneration. Leftward midline shift of 2-3 mm is unchanged. Partial effacement of the right lateral ventricle. Increased conspicuity of the right temporal horn may reflect entrapment. Scattered foci of SWI signal dropout more conspicuous within the left periatrial region. Vascular: Proximally preserved major intracranial flow voids. Skull and upper cervical spine: Sequela of left  craniotomy. Sinuses/Orbits: Normal orbits. Pneumatized paranasal sinuses. No mastoid effusion. Other: None. IMPRESSION: Bifrontal and left cerebrum T2 hyperintense signal with associated irregular enhancement is less conspicuous than prior exam. Masslike T2 hyperintense signal involving the right corpus callosum, centrum semiovale and corona radiata is more conspicuous than prior exam with new micronodular enhancement. Masslike T2 hyperintense signal involving the right temporal lobe with associated heterogenous enhancement is increased since prior exam. Anterior brainstem and bilateral IAC leptomeningeal enhancement, less conspicuous than prior exam. Electronically Signed   By: Primitivo Gauze M.D.   On: 11/19/2019 18:47   DG Chest Port 1 View  Result Date: 11/23/2019 CLINICAL  DATA:  Altered mental status. EXAM: PORTABLE CHEST 1 VIEW COMPARISON:  Prior chest radiographs 10/15/2018 and earlier. FINDINGS: Shallow inspiration radiograph. Heart size within normal limits. No appreciable airspace consolidation or pulmonary edema. No evidence of pleural effusion or pneumothorax. No acute bony abnormality identified. A thoracolumbar levocurvature may be positional. IMPRESSION: No evidence of acute cardiopulmonary abnormality. Electronically Signed   By: Kellie Simmering DO   On: 11/23/2019 09:48   EEG adult  Result Date: 11/24/2019 Lora Havens, MD     11/24/2019  9:13 AM Patient Name: Gregory Moreno MRN: [id number] Epilepsy Attending: Lora Havens Referring Physician/Provider: Dr. Heath Lark Date: 11/23/2019 Duration: 22.49 mins Patient history: 24 year old male with history of glioblastoma involving left temporal lobe who presented with altered mental status.  EEG to evaluate for seizures. Level of alertness: asleep AEDs during EEG study: Keppra, Ativan Technical aspects: This EEG study was done with scalp electrodes positioned according to the 10-20 International system of electrode placement.  Electrical activity was acquired at a sampling rate of 500Hz  and reviewed with a high frequency filter of 70Hz  and a low frequency filter of 1Hz . EEG data were recorded continuously and digitally stored. Description: Sleep was characterized by asymmetric sleep spindles (12 to 14 Hz), L>R, maximal frontocentral region.  EEG showed continuous 3 to 6 Hz theta-delta slowing in left hemisphere with overriding sharply contoured 15-18Hz  beta activity in left frontal region consistent with breach rhythm. Hyperventilation and photic stimulation were not performed.   ABNORMALITY -Continuous slow, left hemisphere -Breach rhythm, left frontal region IMPRESSION: This study showed evidence of cortical dysfunction in left hemisphere, maximal left frontal region consistent with underlying tumor and craniotomy.   No seizures or epileptiform discharges were seen throughout the recording. Lora Havens   DG Lumbar Puncture Fluoro Guide  Result Date: 11/23/2019 CLINICAL DATA:  Fever and worsening mental status. History of brain tumor with prior resection. EXAM: DIAGNOSTIC LUMBAR PUNCTURE UNDER FLUOROSCOPIC GUIDANCE FLUOROSCOPY TIME:  Fluoroscopy Time:  0 minutes, 24 seconds Radiation Exposure Index (if provided by the fluoroscopic device): 3.2 mGy Number of Acquired Spot Images: 0 PROCEDURE: The patient is not able to consent for himself due to altered mental status. I discussed the risks (including hemorrhage, infection, headache, and nerve damage, among others), benefits, and alternatives to fluoroscopically guided lumbar puncture with the patient's mother, who has healthcare power of attorney. We specifically discussed the high technical likelihood of success of the procedure. The patient's mother understood and elected for the patient to undergo the procedure. Standard time-out was employed. Following sterile skin prep and local anesthetic administration consisting of 1 percent lidocaine, a 22 gauge spinal needle was  advanced without difficulty into the thecal sac at the at the L4-5 level under fluoroscopic guidance. Very slightly yellowish tinged CSF was returned. Because of issues with patient agitation, I did not consider it feasible to turn the patient onto his side to obtain an opening pressure measurement. However, CSF flow was mildly sluggish and subjectively I do not feel that there is elevated CSF pressure. 12 cc of CSF was collected. The needle was subsequently removed and the skin cleansed and bandaged. No immediate complications were observed. IMPRESSION: 1. Technically successful fluoroscopically guided lumbar puncture yielding 12 cc of CSF. Electronically Signed   By: Van Clines M.D.   On: 11/23/2019 15:30     CBC Recent Labs  Lab 11/23/19 0948 11/24/19 0638  WBC 13.6* 8.1  HGB 15.1 13.1  HCT 44.6 40.7  PLT  143* 123*  MCV 90.8 94.4  MCH 30.8 30.4  MCHC 33.9 32.2  RDW 16.0* 15.9*  LYMPHSABS 0.5*  --   MONOABS 1.0  --   EOSABS 0.0  --   BASOSABS 0.1  --     Chemistries  Recent Labs  Lab 11/23/19 0948 11/24/19 0638  NA 138 138  K 3.4* 3.6  CL 102 102  CO2 25 26  GLUCOSE 97 88  BUN 20 14  CREATININE 0.90 0.72  CALCIUM 9.1 8.7*  MG  --  1.9  AST 24  --   ALT 28  --   ALKPHOS 42  --   BILITOT 0.8  --    ------------------------------------------------------------------------------------------------------------------ No results for input(s): CHOL, HDL, LDLCALC, TRIG, CHOLHDL, LDLDIRECT in the last 72 hours.  No results found for: HGBA1C ------------------------------------------------------------------------------------------------------------------ No results for input(s): TSH, T4TOTAL, T3FREE, THYROIDAB in the last 72 hours.  Invalid input(s): FREET3 ------------------------------------------------------------------------------------------------------------------ No results for input(s): VITAMINB12, FOLATE, FERRITIN, TIBC, IRON, RETICCTPCT in the last 72  hours.  Coagulation profile Recent Labs  Lab 11/23/19 0948  INR 1.0    No results for input(s): DDIMER in the last 72 hours.  Cardiac Enzymes No results for input(s): CKMB, TROPONINI, MYOGLOBIN in the last 168 hours.  Invalid input(s): CK ------------------------------------------------------------------------------------------------------------------ No results found for: BNP   Roxan Hockey M.D on 11/24/2019 at 6:43 PM  Go to [email or web address] - for contact info  [facility] - Office  [phone]

## 2019-11-24 NOTE — Procedures (Signed)
Patient Name: Gregory Moreno  MRN: [id number]  Epilepsy Attending: Lora Havens  Referring Physician/Provider: Dr. Heath Lark Date: 11/23/2019 Duration: 22.49 mins  Patient history: 24 year old male with history of glioblastoma involving left temporal lobe who presented with altered mental status.  EEG to evaluate for seizures.  Level of alertness: asleep  AEDs during EEG study: Keppra, Ativan  Technical aspects: This EEG study was done with scalp electrodes positioned according to the 10-20 International system of electrode placement. Electrical activity was acquired at a sampling rate of 500Hz  and reviewed with a high frequency filter of 70Hz  and a low frequency filter of 1Hz . EEG data were recorded continuously and digitally stored.   Description: Sleep was characterized by asymmetric sleep spindles (12 to 14 Hz), L>R, maximal frontocentral region.  EEG showed continuous 3 to 6 Hz theta-delta slowing in left hemisphere with overriding sharply contoured 15-18Hz  beta activity in left frontal region consistent with breach rhythm. Hyperventilation and photic stimulation were not performed.     ABNORMALITY -Continuous slow, left hemisphere -Breach rhythm, left frontal region  IMPRESSION: This study showed evidence of cortical dysfunction in left hemisphere, maximal left frontal region consistent with underlying tumor and craniotomy.   No seizures or epileptiform discharges were seen throughout the recording.  Baker Moronta Barbra Sarks

## 2019-11-24 NOTE — Progress Notes (Signed)
   11/24/19 0115  Assess: MEWS Score  Temp 98.3 F (36.8 C)  BP 95/62  Pulse Rate (!) 110  Resp 16  Level of Consciousness Responds to Voice  SpO2 97 %  O2 Device Nasal Cannula  O2 Flow Rate (L/min) 2 L/min  Assess: MEWS Score  MEWS Temp 0  MEWS Systolic 1  MEWS Pulse 1  MEWS RR 0  MEWS LOC 1  MEWS Score 3  MEWS Score Color Yellow  Assess: if the MEWS score is Yellow or Red  Were vital signs taken at a resting state? Yes  Focused Assessment No change from prior assessment  Early Detection of Sepsis Score *See Row Information* Low  MEWS guidelines implemented *See Row Information* No, previously yellow, continue vital signs every 4 hours  Treat  MEWS Interventions Administered scheduled meds/treatments  Pain Scale 0-10  Pain Score 0  Faces Pain Scale 0  Breathing 0  Negative Vocalization 0  Facial Expression 0  Body Language 0  Consolability 0  PAINAD Score 0  Take Vital Signs  Increase Vital Sign Frequency  Yellow: Q 2hr X 2 then Q 4hr X 2, if remains yellow, continue Q 4hrs  Escalate  MEWS: Escalate Yellow: discuss with charge nurse/RN and consider discussing with provider and RRT  Notify: Charge Nurse/RN  Name of Charge Nurse/RN Notified Larene Beach  Date Charge Nurse/RN Notified 11/24/19  Time Charge Nurse/RN Notified 1320  Document  Patient Outcome Other (Comment) (stable)  Progress note created (see row info) Yes

## 2019-11-24 NOTE — Progress Notes (Signed)
   11/24/19 1447  Assess: MEWS Score  Temp 100.3 F (37.9 C)  BP (!) 145/95  Pulse Rate (!) 155 (notified dr courage)  Resp (!) 22  Level of Consciousness Responds to Voice  SpO2 98 %  O2 Device Nasal Cannula  Patient Activity (if Appropriate) In bed  O2 Flow Rate (L/min) 2 L/min  Assess: MEWS Score  MEWS Temp 0  MEWS Systolic 0  MEWS Pulse 3  MEWS RR 1  MEWS LOC 1  MEWS Score 5  MEWS Score Color Red  Assess: if the MEWS score is Yellow or Red  Were vital signs taken at a resting state? Yes  Focused Assessment Change from prior assessment (see assessment flowsheet) (provider aware, ativan and fluids )  Early Detection of Sepsis Score *See Row Information* Low  MEWS guidelines implemented *See Row Information* Yes  Treat  MEWS Interventions Administered scheduled meds/treatments;Escalated (See documentation below);Other (Comment) (notified provider)  Pain Scale PAINAD  Take Vital Signs  Increase Vital Sign Frequency  Red: Q 1hr X 4 then Q 4hr X 4, if remains red, continue Q 4hrs  Escalate  MEWS: Escalate Red: discuss with charge nurse/RN and provider, consider discussing with RRT  Notify: Charge Nurse/RN  Name of Charge Nurse/RN Notified tiffany vogler, RN  Date Charge Nurse/RN Notified 11/24/19  Time Charge Nurse/RN Notified 1454  Notify: Provider  Provider Name/Title courage emokpae, MD  Date Provider Notified 11/24/19  Time Provider Notified 1454  Notification Type Page  Notification Reason Change in status  Response No new orders  Date of Provider Response 11/24/19  Time of Provider Response 1455  Notify: Rapid Response  Name of Rapid Response RN Notified Cydney Ok, RN  Date Rapid Response Notified 11/24/19  Time Rapid Response Notified [id number]  Document  Patient Outcome Other (Comment) (no new orders, monitor per MD)  Progress note created (see row info) Yes

## 2019-11-24 NOTE — Progress Notes (Signed)
Vanc not given due to [facility] in ED stated it seemed to make patient's face turn red.

## 2019-11-25 DIAGNOSIS — G8110 Spastic hemiplegia affecting unspecified side: Secondary | ICD-10-CM

## 2019-11-25 LAB — CBC
HCT: 37.5 % — ABNORMAL LOW (ref 39.0–52.0)
Hemoglobin: 12.4 g/dL — ABNORMAL LOW (ref 13.0–17.0)
MCH: 30.9 pg (ref 26.0–34.0)
MCHC: 33.1 g/dL (ref 30.0–36.0)
MCV: 93.5 fL (ref 80.0–100.0)
Platelets: 123 10*3/uL — ABNORMAL LOW (ref 150–400)
RBC: 4.01 MIL/uL — ABNORMAL LOW (ref 4.22–5.81)
RDW: 15.4 % (ref 11.5–15.5)
WBC: 7.8 10*3/uL (ref 4.0–10.5)
nRBC: 0 % (ref 0.0–0.2)

## 2019-11-25 LAB — GLUCOSE, CAPILLARY
Glucose-Capillary: 102 mg/dL — ABNORMAL HIGH (ref 70–99)
Glucose-Capillary: 120 mg/dL — ABNORMAL HIGH (ref 70–99)
Glucose-Capillary: 68 mg/dL — ABNORMAL LOW (ref 70–99)
Glucose-Capillary: 78 mg/dL (ref 70–99)
Glucose-Capillary: 97 mg/dL (ref 70–99)
Glucose-Capillary: 98 mg/dL (ref 70–99)

## 2019-11-25 LAB — BASIC METABOLIC PANEL
Anion gap: 10 (ref 5–15)
BUN: 9 mg/dL (ref 6–20)
CO2: 26 mmol/L (ref 22–32)
Calcium: 8.5 mg/dL — ABNORMAL LOW (ref 8.9–10.3)
Chloride: 100 mmol/L (ref 98–111)
Creatinine, Ser: 0.67 mg/dL (ref 0.61–1.24)
GFR, Estimated: >60 mL/min (ref >60)
Glucose, Bld: 106 mg/dL — ABNORMAL HIGH (ref 70–99)
Potassium: 3.3 mmol/L — ABNORMAL LOW (ref 3.5–5.1)
Sodium: 136 mmol/L (ref 135–145)

## 2019-11-25 LAB — CYTOLOGY - NON PAP

## 2019-11-25 MED ORDER — DEXTROSE 50 % IV SOLN
INTRAVENOUS | Status: AC
  Administered 2019-11-25: 50 mL
  Filled 2019-11-25: qty 50

## 2019-11-25 NOTE — Progress Notes (Signed)
   11/25/19 1154  Assess: MEWS Score  Temp 98.9 F (37.2 C)  BP (!) 155/51  Pulse Rate (!) 108  Resp (!) 22  SpO2 98 %  O2 Device Nasal Cannula  O2 Flow Rate (L/min) 2 L/min  Assess: MEWS Score  MEWS Temp 0  MEWS Systolic 0  MEWS Pulse 1  MEWS RR 1  MEWS LOC 2  MEWS Score 4  MEWS Score Color Red  Assess: if the MEWS score is Yellow or Red  Were vital signs taken at a resting state? Yes  Focused Assessment No change from prior assessment  Early Detection of Sepsis Score *See Row Information* Low  MEWS guidelines implemented *See Row Information* No, other (Comment) (patient at baseline, provider aware)  Notify: Provider  Provider Name/Title Dr Roderic Palau  Date Provider Notified 11/25/19  Time Provider Notified 1209  Notification Type Page  Notification Reason Other (Comment)  Response Other (Comment) (awaiting orders.)  Date of Provider Response 11/25/19

## 2019-11-25 NOTE — Progress Notes (Signed)
PROGRESS NOTE    Gregory Moreno  EUM:[id number] DOB: 04/13/1995 DOA: 11/23/2019 PCP: [facility]    Brief Narrative:  24 year old male with a history of glioblastoma multiforme admitted to the hospital with worsening mental status.  Differential diagnosis include possible CNS infection versus seizure versus progression of underlying malignancy.  Neurology following.  Palliative care consulted.   Assessment & Plan:   Principal Problem:   Acute encephalopathy Active Problems:   Right spastic hemiplegia (HCC)   Spastic hemiplegia affecting nondominant side (HCC)   Glioblastoma multiforme of temporal lobe (HCC)   Acute encephalopathy -Mental status is not back to baseline, he continues to be lethargic and agitated -EEG without any signs of seizure activity -Since the patient does have neurologic deficits, Keppra dose was increased to 1500 mg twice daily -WBC is not significantly elevated -CSF did show low glucose and high protein -He is on IV antibiotics and antivirals to rule out any infectious cause -Final CSF culture/HSV PCR still in process -Continue to use lorazepam for seizures versus agitation  Glioblastoma multiforme -Originally diagnosed 09/2018 -Patient underwent debulking resection has had radiation therapy as well as chemotherapy -09/2019 patient had recurrence of malignancy with multiple areas affected in the brain and was placed back on chemotherapy -Overall prognosis is poor -Consult note from [facility] reviewed, no further management offered at this time -Long discussion with Dr. [name] who is patient's primary neurooncologist and knows the patient's case well. -Considering the patient's progression of illness and lack of response to therapy, no further aggressive therapy was recommended at this time -Recommendations were for palliative care/hospice  Goals of care -I had an honest discussion with the patient's mother -She recognized  that patient was originally diagnosed approximately 14 months ago and has had a recent decline in the past 2 months since his cancer has recurred -She was willing to meet with palliative care to discuss further goals of care  DVT prophylaxis: SCDs Start: 11/23/19 1520  Code Status: DNR Family Communication: discussed with mother at the bedside Disposition Plan: Status is: Inpatient  Remains inpatient appropriate because:Inpatient level of care appropriate due to severity of illness   Dispo: The patient is from: Home              Anticipated d/c is to: TBD              Anticipated d/c date is: 3 days              Patient currently is not medically stable to d/c.         Consultants:   Neurology  Procedures:   Lumbar puncture 11/8  Antimicrobials:   Acyclovir 11/8>  Ceftriaxone 11/8>  Vancomycin 11/8>   Subjective: Patient is lethargic.  He was agitated earlier in the day and received a dose of Ativan.  Staff reports that he has not been able to have any meaningful movements.  At the most he was moaning earlier today.  Objective: Vitals:   11/25/19 0614 11/25/19 1052 11/25/19 1154 11/25/19 1329  BP: (!) 110/52 117/81 (!) 155/51 (!) 105/52  Pulse: 95 (!) 138 (!) 108 (!) 104  Resp: 20 (!) 24 (!) 22 20  Temp: 98.1 F (36.7 C) 99.6 F (37.6 C) 98.9 F (37.2 C)   TempSrc: Oral Axillary Axillary   SpO2: 100% 97% 98% 99%  Weight:      Height:        Intake/Output Summary (Last 24 hours) at 11/25/2019 1902 Last  data filed at 11/25/2019 1700 Gross per 24 hour  Intake 1438.03 ml  Output 800 ml  Net 638.03 ml   Filed Weights   11/23/19 0921 11/24/19 0115  Weight: 79.2 kg 78.8 kg    Examination:  General exam: lethargic, does not respond to voice  Respiratory system: Clear to auscultation. Respiratory effort normal. Cardiovascular system: S1 & S2 heard, RRR. No JVD, murmurs, rubs, gallops or clicks. No pedal edema. Gastrointestinal system: Abdomen is  nondistended, soft and nontender. No organomegaly or masses felt. Normal bowel sounds heard. Central nervous system: does not follow commands Extremities: no edema bilaterally Skin: No rashes, lesions or ulcers Psychiatry: lethargic    Data Reviewed: I have personally reviewed following labs and imaging studies  CBC: Recent Labs  Lab 11/23/19 0948 11/24/19 0638 11/25/19 0513  WBC 13.6* 8.1 7.8  NEUTROABS 11.7*  --   --   HGB 15.1 13.1 12.4*  HCT 44.6 40.7 37.5*  MCV 90.8 94.4 93.5  PLT 143* 123* 220*   Basic Metabolic Panel: Recent Labs  Lab 11/23/19 0948 11/24/19 0638 11/25/19 0513  NA 138 138 136  K 3.4* 3.6 3.3*  CL 102 102 100  CO2 25 26 26   GLUCOSE 97 88 106*  BUN 20 14 9   CREATININE 0.90 0.72 0.67  CALCIUM 9.1 8.7* 8.5*  MG  --  1.9  --    GFR: Estimated Creatinine Clearance: 143.6 mL/min (by C-G formula based on SCr of 0.67 mg/dL). Liver Function Tests: Recent Labs  Lab 11/23/19 0948  AST 24  ALT 28  ALKPHOS 42  BILITOT 0.8  PROT 6.6  ALBUMIN 3.9   No results for input(s): LIPASE, AMYLASE in the last 168 hours. No results for input(s): AMMONIA in the last 168 hours. Coagulation Profile: Recent Labs  Lab 11/23/19 0948  INR 1.0   Cardiac Enzymes: No results for input(s): CKTOTAL, CKMB, CKMBINDEX, TROPONINI in the last 168 hours. BNP (last 3 results) No results for input(s): PROBNP in the last 8760 hours. HbA1C: No results for input(s): HGBA1C in the last 72 hours. CBG: Recent Labs  Lab 11/25/19 0617 11/25/19 0743 11/25/19 0834 11/25/19 1119 11/25/19 1642  GLUCAP 98 68* 102* 78 120*   Lipid Profile: No results for input(s): CHOL, HDL, LDLCALC, TRIG, CHOLHDL, LDLDIRECT in the last 72 hours. Thyroid Function Tests: No results for input(s): TSH, T4TOTAL, FREET4, T3FREE, THYROIDAB in the last 72 hours. Anemia Panel: No results for input(s): VITAMINB12, FOLATE, FERRITIN, TIBC, IRON, RETICCTPCT in the last 72 hours. Sepsis Labs: Recent  Labs  Lab 11/23/19 0948 11/23/19 1243  LATICACIDVEN 2.4* 2.6*    Recent Results (from the past 240 hour(s))  Culture, blood (Routine x 2)     Status: None (Preliminary result)   Collection Time: 11/23/19  9:20 AM   Specimen: BLOOD LEFT HAND  Result Value Ref Range Status   Specimen Description BLOOD LEFT HAND  Final   Special Requests   Final    BOTTLES DRAWN AEROBIC AND ANAEROBIC Blood Culture adequate volume   Culture   Final    NO GROWTH 2 DAYS Performed at [facility], [address] [place], [place] [address]    Report Status PENDING  Incomplete  Respiratory Panel by RT PCR (Flu A&B, Covid) - Nasopharyngeal Swab     Status: None   Collection Time: 11/23/19  9:48 AM   Specimen: Nasopharyngeal Swab  Result Value Ref Range Status   SARS Coronavirus 2 by RT PCR NEGATIVE NEGATIVE Final  Comment: (NOTE) SARS-CoV-2 target nucleic acids are NOT DETECTED.  The SARS-CoV-2 RNA is generally detectable in upper respiratoy specimens during the acute phase of infection. The lowest concentration of SARS-CoV-2 viral copies this assay can detect is 131 copies/mL. A negative result does not preclude SARS-Cov-2 infection and should not be used as the sole basis for treatment or other patient management decisions. A negative result may occur with  improper specimen collection/handling, submission of specimen other than nasopharyngeal swab, presence of viral mutation(s) within the areas targeted by this assay, and inadequate number of viral copies (<131 copies/mL). A negative result must be combined with clinical observations, patient history, and epidemiological information. The expected result is Negative.  Fact Sheet for Patients:  PinkCheek.be  Fact Sheet for Healthcare Providers:  GravelBags.it  This test is no t yet approved or cleared by the [place] FDA and  has been authorized for detection and/or diagnosis of  SARS-CoV-2 by FDA under an Emergency Use Authorization (EUA). This EUA will remain  in effect (meaning this test can be used) for the duration of the COVID-19 declaration under Section 564(b)(1) of the Act, 21 U.S.C. section 360bbb-3(b)(1), unless the authorization is terminated or revoked sooner.     Influenza A by PCR NEGATIVE NEGATIVE Final   Influenza B by PCR NEGATIVE NEGATIVE Final    Comment: (NOTE) The Xpert Xpress SARS-CoV-2/FLU/RSV assay is intended as an aid in  the diagnosis of influenza from Nasopharyngeal swab specimens and  should not be used as a sole basis for treatment. Nasal washings and  aspirates are unacceptable for Xpert Xpress SARS-CoV-2/FLU/RSV  testing.  Fact Sheet for Patients: PinkCheek.be  Fact Sheet for Healthcare Providers: GravelBags.it  This test is not yet approved or cleared by the [place] FDA and  has been authorized for detection and/or diagnosis of SARS-CoV-2 by  FDA under an Emergency Use Authorization (EUA). This EUA will remain  in effect (meaning this test can be used) for the duration of the  Covid-19 declaration under Section 564(b)(1) of the Act, 21  U.S.C. section 360bbb-3(b)(1), unless the authorization is  terminated or revoked. Performed at [facility], [address], [place], [place] [address]   Culture, blood (Routine x 2)     Status: None (Preliminary result)   Collection Time: 11/23/19 10:17 AM   Specimen: BLOOD  Result Value Ref Range Status   Specimen Description BLOOD RIGHT ANTECUBITAL  Final   Special Requests   Final    BOTTLES DRAWN AEROBIC AND ANAEROBIC Blood Culture adequate volume   Culture   Final    NO GROWTH 2 DAYS Performed at [facility], [address] [place], [place] [address]    Report Status PENDING  Incomplete  CSF culture with Stat gram stain     Status: None (Preliminary result)   Collection Time: 11/23/19  2:32 PM   Specimen: CSF;  Cerebrospinal Fluid  Result Value Ref Range Status   Specimen Description   Final    CSF Performed at [facility], [address] [place], [place] [address]    Special Requests   Final    Immunocompromised Performed at [facility], [address], [place], [place] [address]    Gram Stain NO WBC SEEN NO ORGANISMS SEEN CYTOSPIN SMEAR   Final   Culture   Final    NO GROWTH < 24 HOURS Performed at [facility], [place] [address] [place], [place] [address]    Report Status PENDING  Incomplete  Radiology Studies: EEG adult  Result Date: 2019-12-02 Lora Havens, MD     December 02, 2019  9:13 AM Patient Name: KHOLE BRANCH MRN: [id number] Epilepsy Attending: Lora Havens Referring Physician/Provider: Dr. Heath Lark Date: 11/23/2019 Duration: 22.49 mins Patient history: 24 year old male with history of glioblastoma involving left temporal lobe who presented with altered mental status.  EEG to evaluate for seizures. Level of alertness: asleep AEDs during EEG study: Keppra, Ativan Technical aspects: This EEG study was done with scalp electrodes positioned according to the 10-20 International system of electrode placement. Electrical activity was acquired at a sampling rate of 500Hz  and reviewed with a high frequency filter of 70Hz  and a low frequency filter of 1Hz . EEG data were recorded continuously and digitally stored. Description: Sleep was characterized by asymmetric sleep spindles (12 to 14 Hz), L>R, maximal frontocentral region.  EEG showed continuous 3 to 6 Hz theta-delta slowing in left hemisphere with overriding sharply contoured 15-18Hz  beta activity in left frontal region consistent with breach rhythm. Hyperventilation and photic stimulation were not performed.   ABNORMALITY -Continuous slow, left hemisphere -Breach rhythm, left frontal region IMPRESSION: This study showed evidence of cortical dysfunction in left hemisphere, maximal left frontal region consistent with  underlying tumor and craniotomy.   No seizures or epileptiform discharges were seen throughout the recording. Priyanka Barbra Sarks        Scheduled Meds: . dexamethasone (DECADRON) injection  10 mg Intravenous Q24H  . escitalopram  5 mg Oral QHS   Continuous Infusions: . sodium chloride 500 mL (11/23/19 2252)  . acyclovir 790 mg (11/25/19 1500)  . cefTRIAXone (ROCEPHIN)  IV 2 g (11/25/19 1719)  . dextrose 5 % and 0.45 % NaCl with KCl 20 mEq/L 100 mL/hr at 12/02/2019 2245  . famotidine (PEPCID) IV 20 mg (11/25/19 1033)  . levETIRAcetam 1,500 mg (11/25/19 1620)  . vancomycin 1,000 mg (11/25/19 1151)     LOS: 2 days    Time spent: 91mins    Kathie Dike, MD [facility]   If 7PM-7AM, please contact night-coverage [email or web address]  11/25/2019, 7:02 PM

## 2019-11-25 NOTE — Progress Notes (Signed)
[place] A. Merlene Laughter, MD     [email or web address]          Gregory Moreno is an 24 y.o. male.   ASSESSMENT/PLAN: 1. Acute encephalopathy: Etiology includes primary CNS infection such as meningitis or encephalitis, seizures and the progression of glioblastoma.  So far he has not improved with treatment.  No clear evidence of infection which suggest that the worsening neurological status is likely due to progression of glioblastoma.  The overall plan is to continue with care for now but get palliative medicine involved. 2. Glioblastoma multiforme with recent recurrence 3. 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  He has had more episodes of restlessness.  He got in dose of Ativan today because of the restlessness.  The mother is at the bedside and the case discussed with her.  GENERAL: This is a mildly overweight male who is laying unresponsive in bed.  HEENT: Neck is most is supple although there is some axial rigidity noted episodically. No trauma noted.  ABDOMEN: Soft  EXTREMITIES: No edema   BACK: Normal alignment.  SKIN: Normal by inspection.    MENTAL STATUS: His eyes are open today but he does not focus and is unresponsive.  There is no verbal output.  He does not follow commands.  CRANIAL NERVES: Pupils are equal, round and reactive to light (often quite dilated 8 mm at times); extraocular movements are full - by oculocephalic reflexes, there is no significant nystagmus; upper and lower facial muscles are normal in strength and symmetric, there is no flattening of the nasolabial folds.  MOTOR: He does lift the upper extremity against gravity.  He withdraws purposefully to painful stimuli in all 4 extremities.  The left lower extremity is graded as 2/5.  Left upper extremity 1/5.  Right upper extremity is flexed with markedly increased tone/spasticity in graded as 2/5. The right leg has  intermittent increased tone in graded as  2/5.  COORDINATION: The patient was noted to have interrupted tremor/twitching of the vastus lateralis muscle on the left side of unclear significance. No parkinsonian features.  The patient has had a couple episodes of extension of the right upper extremity suggestive of decerebrate posturing with return to his typical flexed position.  REFLEXES: Deep tendon reflexes are symmetrical and normal to brisk throughout. Plantar responses are upgoing on the right and equivocal on the left.   SENSATION: Grimaces to deep painful stimuli bilaterally.    Blood pressure (!) 105/52, pulse (!) 104, temperature 98.9 F (37.2 C), temperature source Axillary, resp. rate 20, height 5\' 9"  (1.753 m), weight 78.8 kg, SpO2 99 %.  Past Medical History:  Diagnosis Date  . Glioblastoma [facility])     Past Surgical History:  Procedure Laterality Date  . CRANIOTOMY Left 10/13/2018   Procedure: LEFT CRANIOTOMY FOR TUMOR RESECTION;  Surgeon: Judith Part, MD;  Location: [place];  Service: Neurosurgery;  Laterality: Left;    No family history on file.  Social History:  reports that he has never smoked. He has never used smokeless tobacco. No history on file for alcohol use and drug use.  Allergies: No Known Allergies  Medications: Prior to Admission medications   Medication Sig Start Date End Date Taking? Authorizing Provider  Baclofen 5 MG TABS Take 1 tablet by mouth daily as needed.  12/25/18  Yes [provider]  camphor-menthol Timoteo Ace) lotion Apply 1 application topically as needed for itching. 06/04/19  Yes Raulkar, Clide Deutscher, MD  dexamethasone (DECADRON) 4 MG tablet Take 1 tablet (4 mg total) by mouth daily. 11/02/19  Yes Vaslow, Acey Lav, MD  dronabinol (MARINOL) 5 MG capsule Take 1 capsule (5 mg total) by mouth 2 (two) times daily before lunch and supper. 10/03/19  Yes Lane Hacker L, DO  escitalopram (LEXAPRO) 5 MG tablet Take 1 tablet (5 mg  total) by mouth at bedtime. 10/15/19  Yes Vaslow, Acey Lav, MD  levETIRAcetam (KEPPRA) 250 MG tablet Take 1 tablet (250 mg total) by mouth 2 (two) times daily. 09/24/19  Yes Vaslow, Acey Lav, MD  ondansetron (ZOFRAN) 4 MG tablet Take 1 tablet (4 mg total) by mouth every 8 (eight) hours as needed for nausea or vomiting. Patient taking differently: Take 4 mg by mouth in the morning and at bedtime.  11/02/19  Yes Vaslow, Acey Lav, MD  pantoprazole (PROTONIX) 40 MG tablet Take 1 tablet (40 mg total) by mouth daily. 11/16/19  Yes Vaslow, Acey Lav, MD  senna (SENOKOT) 8.6 MG tablet Take 2 tablets by mouth daily as needed for constipation.    Yes [provider]  temozolomide (TEMODAR) 100 MG capsule TAKE 1 CAPSULE BY MOUTH ONCE DAILY. MAY TAKE ON EMPTY STOMACH TO DECREASE NAUSEA AND VOMITING Patient taking differently: Take 100 mg by mouth daily.  11/20/19  Yes Vaslow, Acey Lav, MD  traMADol (ULTRAM) 50 MG tablet Take 1 tablet (50 mg total) by mouth every 6 (six) hours as needed for severe pain. 11/16/19  Yes Vaslow, Acey Lav, MD  escitalopram (LEXAPRO) 5 MG tablet TAKE 1 TABLET BY MOUTH EVERYDAY AT BEDTIME 02/20/19   Raulkar, Clide Deutscher, MD    Scheduled Meds: . dexamethasone (DECADRON) injection  10 mg Intravenous Q24H  . escitalopram  5 mg Oral QHS   Continuous Infusions: . sodium chloride 500 mL (11/23/19 2252)  . acyclovir 790 mg (11/25/19 1500)  . cefTRIAXone (ROCEPHIN)  IV 2 g (11/25/19 0235)  . dextrose 5 % and 0.45 % NaCl with KCl 20 mEq/L 100 mL/hr at 11/24/19 2245  . famotidine (PEPCID) IV 20 mg (11/25/19 1033)  . levETIRAcetam 1,500 mg (11/25/19 1620)  . vancomycin 1,000 mg (11/25/19 1151)   PRN Meds:.sodium chloride, acetaminophen **OR** acetaminophen, camphor-menthol, LORazepam, ondansetron **OR** ondansetron (ZOFRAN) IV     Results for orders placed or performed during the hospital encounter of 11/23/19 (from the past 48 hour(s))  Magnesium     Status: None   Collection  Time: 11/24/19  6:38 AM  Result Value Ref Range   Magnesium 1.9 1.7 - 2.4 mg/dL    Comment: Performed at [facility], [address], [place], [place] [address]  Basic metabolic panel     Status: Abnormal   Collection Time: 11/24/19  6:38 AM  Result Value Ref Range   Sodium 138 135 - 145 mmol/L   Potassium 3.6 3.5 - 5.1 mmol/L   Chloride 102 98 - 111 mmol/L   CO2 26 22 - 32 mmol/L   Glucose, Bld 88 70 - 99 mg/dL    Comment: Glucose reference range applies only to samples taken after fasting for at least 8 hours.   BUN 14 6 - 20 mg/dL   Creatinine, Ser 0.72 0.61 - 1.24 mg/dL   Calcium 8.7 (L) 8.9 - 10.3 mg/dL   GFR, Estimated >60 >60 mL/min    Comment: (NOTE) Calculated using the CKD-EPI Creatinine Equation (2021)    Anion gap 10 5 - 15    Comment:  Performed at [facility], [address], [place], [place] [address]  CBC     Status: Abnormal   Collection Time: 11/24/19  6:38 AM  Result Value Ref Range   WBC 8.1 4.0 - 10.5 K/uL   RBC 4.31 4.22 - 5.81 MIL/uL   Hemoglobin 13.1 13.0 - 17.0 g/dL   HCT 40.7 39 - 52 %   MCV 94.4 80.0 - 100.0 fL   MCH 30.4 26.0 - 34.0 pg   MCHC 32.2 30.0 - 36.0 g/dL   RDW 15.9 (H) 11.5 - 15.5 %   Platelets 123 (L) 150 - 400 K/uL   nRBC 0.0 0.0 - 0.2 %    Comment: Performed at [facility], [address], [place], [place] [address]  Glucose, capillary     Status: None   Collection Time: 11/24/19 11:01 AM  Result Value Ref Range   Glucose-Capillary 87 70 - 99 mg/dL    Comment: Glucose reference range applies only to samples taken after fasting for at least 8 hours.  Glucose, capillary     Status: Abnormal   Collection Time: 11/24/19  6:09 PM  Result Value Ref Range   Glucose-Capillary 124 (H) 70 - 99 mg/dL    Comment: Glucose reference range applies only to samples taken after fasting for at least 8 hours.  Glucose, capillary     Status: None   Collection Time: 11/25/19 12:38 AM  Result Value Ref Range   Glucose-Capillary 97 70 - 99 mg/dL     Comment: Glucose reference range applies only to samples taken after fasting for at least 8 hours.   Comment 1 Notify RN    Comment 2 Document in Chart   CBC     Status: Abnormal   Collection Time: 11/25/19  5:13 AM  Result Value Ref Range   WBC 7.8 4.0 - 10.5 K/uL   RBC 4.01 (L) 4.22 - 5.81 MIL/uL   Hemoglobin 12.4 (L) 13.0 - 17.0 g/dL   HCT 37.5 (L) 39 - 52 %   MCV 93.5 80.0 - 100.0 fL   MCH 30.9 26.0 - 34.0 pg   MCHC 33.1 30.0 - 36.0 g/dL   RDW 15.4 11.5 - 15.5 %   Platelets 123 (L) 150 - 400 K/uL   nRBC 0.0 0.0 - 0.2 %    Comment: Performed at [facility], [address], [place], [place] [address]  Basic metabolic panel     Status: Abnormal   Collection Time: 11/25/19  5:13 AM  Result Value Ref Range   Sodium 136 135 - 145 mmol/L   Potassium 3.3 (L) 3.5 - 5.1 mmol/L   Chloride 100 98 - 111 mmol/L   CO2 26 22 - 32 mmol/L   Glucose, Bld 106 (H) 70 - 99 mg/dL    Comment: Glucose reference range applies only to samples taken after fasting for at least 8 hours.   BUN 9 6 - 20 mg/dL   Creatinine, Ser 0.67 0.61 - 1.24 mg/dL   Calcium 8.5 (L) 8.9 - 10.3 mg/dL   GFR, Estimated >60 >60 mL/min    Comment: (NOTE) Calculated using the CKD-EPI Creatinine Equation (2021)    Anion gap 10 5 - 15    Comment: Performed at [facility], [address] [place], [place] [address]  Glucose, capillary     Status: None   Collection Time: 11/25/19  6:17 AM  Result Value Ref Range   Glucose-Capillary 98 70 - 99 mg/dL    Comment: Glucose  reference range applies only to samples taken after fasting for at least 8 hours.   Comment 1 Notify RN    Comment 2 Document in Chart   Glucose, capillary     Status: Abnormal   Collection Time: 11/25/19  7:43 AM  Result Value Ref Range   Glucose-Capillary 68 (L) 70 - 99 mg/dL    Comment: Glucose reference range applies only to samples taken after fasting for at least 8 hours.  Glucose, capillary     Status: Abnormal   Collection Time: 11/25/19  8:34 AM   Result Value Ref Range   Glucose-Capillary 102 (H) 70 - 99 mg/dL    Comment: Glucose reference range applies only to samples taken after fasting for at least 8 hours.  Glucose, capillary     Status: None   Collection Time: 11/25/19 11:19 AM  Result Value Ref Range   Glucose-Capillary 78 70 - 99 mg/dL    Comment: Glucose reference range applies only to samples taken after fasting for at least 8 hours.    Studies/Results:  HEAD CT  FINDINGS: Brain: The mass involving the right frontal white matter, corpus callosum, and right temporal lobe was better characterized on recent MRI with contrast, but appears grossly similar. Grossly similar cystic encephalomalacia in the left temporal lobe and edema in the left frontal lobe. Similar rounding of the right temporal horn, likely represent a component of ventricular entrapment. Similar ventricular size. No acute hemorrhage. Absence of contrast precludes evaluation for IAC and brainstem finding seen on prior MRI.  Vascular: No hyperdense vessel or unexpected calcification.  Skull: Left craniotomy.  Sinuses/Orbits: No acute findings.  Other: No mastoid effusions.  IMPRESSION: 1. The mass involving the right frontal white matter, corpus callosum, and right temporal lobe appears grossly similar when comparing across modalities. MRI with contrast could better evaluate for progression. 2. Similar rounding of the right temporal horn, likely representing a component of ventricular entrapment. No progressive ventriculomegaly. 3. No specific evidence of new/acute superimposed abnormality.    EEG Description: Sleep was characterized by asymmetric sleep spindles (12 to 14 Hz), L>R, maximal frontocentral region.  EEG showed continuous 3 to 6 Hz theta-delta slowing in left hemisphere with overriding sharply contoured 15-18Hz  beta activity in left frontal region consistent with breach rhythm. Hyperventilation and photic stimulation were  not performed.     ABNORMALITY -Continuous slow, left hemisphere -Breach rhythm, left frontal region  IMPRESSION: This study showed evidence of cortical dysfunction in left hemisphere, maximal left frontal region consistent with underlying tumor and craniotomy.   No seizures or epileptiform discharges were seen throughout the recording.         The head CT scan is reviewed in person and compared to the brain MRI scan both show a large area of encephalomalacia involving the entire left temple region. There is reduced signal involving the June new of the corpus callosum bilaterally more on the left side. Similar findings are noted the deep white matter structure on the right side adjacent to the lateral ventricle involving the centrum semiovale. This associated with mass effect and likely due to cytotoxic edema. There is evidence of enlarged right temporal horn. No clear evidence of acute hydrocephalus and change from previous MRI scan which shows similar findings. The MRI done a few days ago is also reviewed in person in in their seemed low change in both scans.    Elvert Cumpton A. Merlene Laughter, M.D.  Diplomate, Tax adviser of Psychiatry and Neurology ( Neurology). 11/25/2019, 4:40 PM

## 2019-11-26 ENCOUNTER — Encounter (HOSPITAL_COMMUNITY): Payer: Self-pay | Admitting: Internal Medicine

## 2019-11-26 ENCOUNTER — Inpatient Hospital Stay (HOSPITAL_COMMUNITY)
Admission: RE | Admit: 2019-11-26 | Discharge: 2019-11-28 | DRG: 951 | Disposition: A | Discharge status: Hospice | Source: Hospice | Attending: Internal Medicine | Admitting: Internal Medicine

## 2019-11-26 DIAGNOSIS — G9340 Encephalopathy, unspecified: Secondary | ICD-10-CM

## 2019-11-26 DIAGNOSIS — G939 Disorder of brain, unspecified: Secondary | ICD-10-CM | POA: Diagnosis present

## 2019-11-26 DIAGNOSIS — C712 Malignant neoplasm of temporal lobe: Secondary | ICD-10-CM | POA: Diagnosis not present

## 2019-11-26 DIAGNOSIS — R627 Adult failure to thrive: Secondary | ICD-10-CM | POA: Diagnosis present

## 2019-11-26 DIAGNOSIS — C719 Malignant neoplasm of brain, unspecified: Secondary | ICD-10-CM | POA: Diagnosis present

## 2019-11-26 DIAGNOSIS — R4182 Altered mental status, unspecified: Secondary | ICD-10-CM | POA: Diagnosis present

## 2019-11-26 DIAGNOSIS — Z7189 Other specified counseling: Secondary | ICD-10-CM

## 2019-11-26 DIAGNOSIS — Z515 Encounter for palliative care: Secondary | ICD-10-CM | POA: Diagnosis not present

## 2019-11-26 DIAGNOSIS — R4701 Aphasia: Secondary | ICD-10-CM | POA: Diagnosis present

## 2019-11-26 DIAGNOSIS — Z20822 Contact with and (suspected) exposure to covid-19: Secondary | ICD-10-CM | POA: Diagnosis present

## 2019-11-26 DIAGNOSIS — Z66 Do not resuscitate: Secondary | ICD-10-CM | POA: Diagnosis present

## 2019-11-26 DIAGNOSIS — Z79899 Other long term (current) drug therapy: Secondary | ICD-10-CM | POA: Diagnosis not present

## 2019-11-26 DIAGNOSIS — G8111 Spastic hemiplegia affecting right dominant side: Secondary | ICD-10-CM | POA: Diagnosis present

## 2019-11-26 LAB — GLUCOSE, CAPILLARY: Glucose-Capillary: 103 mg/dL — ABNORMAL HIGH (ref 70–99)

## 2019-11-26 MED ORDER — ACETAMINOPHEN 325 MG PO TABS: 650.0000 mg | ORAL_TABLET | Freq: Four times a day (QID) | ORAL | Status: DC | PRN

## 2019-11-26 MED ORDER — GLYCOPYRROLATE 0.2 MG/ML IJ SOLN: 0.2000 mg | INTRAMUSCULAR | Status: DC | PRN

## 2019-11-26 MED ORDER — MORPHINE BOLUS VIA INFUSION
4.0000 mg | INTRAVENOUS | Status: DC | PRN
  Administered 2019-11-26: 4 mg via INTRAVENOUS
  Filled 2019-11-26: qty 4

## 2019-11-26 MED ORDER — HALOPERIDOL LACTATE 5 MG/ML IJ SOLN: 0.5000 mg | INTRAMUSCULAR | Status: DC | PRN

## 2019-11-26 MED ORDER — DEXAMETHASONE SODIUM PHOSPHATE 10 MG/ML IJ SOLN: 10.0000 mg | INTRAMUSCULAR | 0 refills | Status: DC

## 2019-11-26 MED ORDER — ONDANSETRON HCL 4 MG/2ML IJ SOLN: 4.0000 mg | Freq: Four times a day (QID) | INTRAMUSCULAR | Status: DC | PRN

## 2019-11-26 MED ORDER — ONDANSETRON 4 MG PO TBDP: 4.0000 mg | ORAL_TABLET | Freq: Four times a day (QID) | ORAL | Status: DC | PRN

## 2019-11-26 MED ORDER — LORAZEPAM 2 MG/ML IJ SOLN: 2.0000 mg | INTRAMUSCULAR | Status: DC | PRN

## 2019-11-26 MED ORDER — ACETAMINOPHEN 650 MG RE SUPP: 650.0000 mg | Freq: Four times a day (QID) | RECTAL | Status: DC | PRN

## 2019-11-26 MED ORDER — HALOPERIDOL LACTATE 2 MG/ML PO CONC: 0.5000 mg | ORAL | Status: DC | PRN

## 2019-11-26 MED ORDER — BIOTENE DRY MOUTH MT LIQD: 15.0000 mL | OROMUCOSAL | Status: DC | PRN

## 2019-11-26 MED ORDER — DEXAMETHASONE SODIUM PHOSPHATE 10 MG/ML IJ SOLN
10.0000 mg | INTRAMUSCULAR | Status: DC
  Administered 2019-11-27 – 2019-11-28 (×2): 10 mg via INTRAVENOUS
  Filled 2019-11-26 (×2): qty 1

## 2019-11-26 MED ORDER — LEVETIRACETAM IN NACL 1500 MG/100ML IV SOLN
1500.0000 mg | Freq: Two times a day (BID) | INTRAVENOUS | Status: DC
  Administered 2019-11-27 – 2019-11-28 (×2): 1500 mg via INTRAVENOUS
  Filled 2019-11-26 (×4): qty 100

## 2019-11-26 MED ORDER — MORPHINE 100MG IN NS 100ML (1MG/ML) PREMIX INFUSION
2.0000 mg/h | INTRAVENOUS | Status: DC
  Administered 2019-11-26: 2 mg/h via INTRAVENOUS
  Filled 2019-11-26: qty 100

## 2019-11-26 MED ORDER — LEVETIRACETAM IN NACL 1500 MG/100ML IV SOLN
1500.0000 mg | Freq: Two times a day (BID) | INTRAVENOUS | Status: DC
Start: 2019-11-27 — End: 2019-11-28

## 2019-11-26 MED ORDER — MORPHINE 100MG IN NS 100ML (1MG/ML) PREMIX INFUSION
2.0000 mg/h | INTRAVENOUS | Status: DC
  Administered 2019-11-28: 2 mg/h via INTRAVENOUS
  Filled 2019-11-26 (×2): qty 100

## 2019-11-26 MED ORDER — GLYCOPYRROLATE 1 MG PO TABS: 1.0000 mg | ORAL_TABLET | ORAL | Status: DC | PRN

## 2019-11-26 MED ORDER — LORAZEPAM 2 MG/ML PO CONC: 1.0000 mg | ORAL | Status: DC | PRN

## 2019-11-26 MED ORDER — POLYVINYL ALCOHOL 1.4 % OP SOLN: 1.0000 [drp] | Freq: Four times a day (QID) | OPHTHALMIC | Status: DC | PRN

## 2019-11-26 MED ORDER — HALOPERIDOL 0.5 MG PO TABS: 0.5000 mg | ORAL_TABLET | ORAL | Status: DC | PRN

## 2019-11-26 MED ORDER — MORPHINE BOLUS VIA INFUSION
4.0000 mg | INTRAVENOUS | Status: DC | PRN
  Administered 2019-11-28 (×2): 4 mg via INTRAVENOUS
  Filled 2019-11-26: qty 4

## 2019-11-26 MED ORDER — MORPHINE SULFATE (PF) 2 MG/ML IV SOLN
2.0000 mg | INTRAVENOUS | Status: DC | PRN
  Administered 2019-11-26 (×2): 2 mg via INTRAVENOUS
  Filled 2019-11-26 (×2): qty 1

## 2019-11-26 MED ORDER — LORAZEPAM 1 MG PO TABS: 1.0000 mg | ORAL_TABLET | ORAL | Status: DC | PRN

## 2019-11-26 NOTE — H&P (Signed)
History and Physical    Gregory Moreno VEL:[id number] DOB: 11/22/1995 DOA: 11/26/2019  PCP: [facility] [place]  Patient coming from: Home  I have personally briefly reviewed patient's old medical records in [place]  Chief Complaint: GIP status  HPI: Gregory Moreno is a 24 y.o. male with medical history significant of glioblastoma multiforme, admitted to the hospital with altered mental status.  Initial concerns were for underlying seizures versus CNS infection.  He was treated with antiepileptics and antibiotics.  After patient did not have any significant improvement, case was reviewed with neurology and neuro-oncology and it was felt that his symptoms may be related to progressive worsening of his underlying malignancy.  No further treatments were offered at this time.  After discussion with patient's parents, they elected to pursue comfort measures with hospice.  He is currently under GIP status and is awaiting transfer to residential hospice facility.    Review of Systems: Patient is unresponsive   Past Medical History:  Diagnosis Date  . Glioblastoma [facility])     Past Surgical History:  Procedure Laterality Date  . CRANIOTOMY Left 10/13/2018   Procedure: LEFT CRANIOTOMY FOR TUMOR RESECTION;  Surgeon: Judith Part, MD;  Location: [place];  Service: Neurosurgery;  Laterality: Left;    Social History:  reports that he has never smoked. He has never used smokeless tobacco. No history on file for alcohol use and drug use.  No Known Allergies  Family history: Family history reviewed and not pertinent  Prior to Admission medications   Medication Sig Start Date End Date Taking? Authorizing Provider  dexamethasone (DECADRON) 10 MG/ML injection Inject 1 mL (10 mg total) into the vein daily. 11/27/19   Kathie Dike, MD  levETIRacetam (KEPPRA) 1500 MG/100ML SOLN Inject 100 mLs (1,500 mg total) into the vein every 12 (twelve) hours. 11/27/19   Kathie Dike, MD  escitalopram (LEXAPRO) 5 MG tablet TAKE 1 TABLET BY MOUTH EVERYDAY AT BEDTIME 02/20/19   Raulkar, Clide Deutscher, MD    Physical Exam: There were no vitals filed for this visit.  Constitutional: Appears uncomfortable, increased respiratory effort Eyes: PERRL, lids and conjunctivae normal ENMT: Mucous membranes are dry. Posterior pharynx clear of any exudate or lesions.Normal dentition.  Neck: normal, supple, no masses, no thyromegaly Respiratory: clear to auscultation bilaterally, no wheezing, no crackles.  Increased respiratory effort.  Cardiovascular: Tachycardic. No extremity edema. 2+ pedal pulses. No carotid bruits.  Abdomen: no tenderness, no masses palpated. No hepatosplenomegaly. Bowel sounds positive.  Musculoskeletal: no clubbing / cyanosis. No joint deformity upper and lower extremities. Good ROM, no contractures. Normal muscle tone.  Skin: no rashes, lesions, ulcers. No induration Neurologic: Unresponsive Psychiatric: Unresponsive   Labs on Admission: I have personally reviewed following labs and imaging studies  CBC: Recent Labs  Lab 11/23/19 0948 11/24/19 0638 11/25/19 0513  WBC 13.6* 8.1 7.8  NEUTROABS 11.7*  --   --   HGB 15.1 13.1 12.4*  HCT 44.6 40.7 37.5*  MCV 90.8 94.4 93.5  PLT 143* 123* 258*   Basic Metabolic Panel: Recent Labs  Lab 11/23/19 0948 11/24/19 0638 11/25/19 0513  NA 138 138 136  K 3.4* 3.6 3.3*  CL 102 102 100  CO2 25 26 26   GLUCOSE 97 88 106*  BUN 20 14 9   CREATININE 0.90 0.72 0.67  CALCIUM 9.1 8.7* 8.5*  MG  --  1.9  --    GFR: Estimated Creatinine Clearance: 143.6 mL/min (by C-G formula based on SCr  of 0.67 mg/dL). Liver Function Tests: Recent Labs  Lab 11/23/19 0948  AST 24  ALT 28  ALKPHOS 42  BILITOT 0.8  PROT 6.6  ALBUMIN 3.9   No results for input(s): LIPASE, AMYLASE in the last 168 hours. No results for input(s): AMMONIA in the last 168 hours. Coagulation Profile: Recent Labs  Lab 11/23/19 0948    INR 1.0   Cardiac Enzymes: No results for input(s): CKTOTAL, CKMB, CKMBINDEX, TROPONINI in the last 168 hours. BNP (last 3 results) No results for input(s): PROBNP in the last 8760 hours. HbA1C: No results for input(s): HGBA1C in the last 72 hours. CBG: Recent Labs  Lab 11/25/19 0743 11/25/19 0834 11/25/19 1119 11/25/19 1642 11/26/19 1613  GLUCAP 68* 102* 78 120* 103*   Lipid Profile: No results for input(s): CHOL, HDL, LDLCALC, TRIG, CHOLHDL, LDLDIRECT in the last 72 hours. Thyroid Function Tests: No results for input(s): TSH, T4TOTAL, FREET4, T3FREE, THYROIDAB in the last 72 hours. Anemia Panel: No results for input(s): VITAMINB12, FOLATE, FERRITIN, TIBC, IRON, RETICCTPCT in the last 72 hours. Urine analysis:    Component Value Date/Time   COLORURINE YELLOW 11/23/2019 [place] 11/23/2019 0924   LABSPEC 1.021 11/23/2019 0924   PHURINE 5.0 11/23/2019 0924   GLUCOSEU NEGATIVE 11/23/2019 0924   HGBUR NEGATIVE 11/23/2019 0924   BILIRUBINUR NEGATIVE 11/23/2019 0924   KETONESUR NEGATIVE 11/23/2019 0924   PROTEINUR 30 (A) 11/23/2019 0924   NITRITE NEGATIVE 11/23/2019 0924   LEUKOCYTESUR NEGATIVE 11/23/2019 0924    Radiological Exams on Admission: No results found.   Assessment/Plan Active Problems:   Glioblastoma multiforme ([place])     24 year old male with history of glioblastoma multiforme, admitted to the hospital with alteration mental status.  Found to have progression of his underlying malignancy.  Family has elected comfort care.  He has been accepted by hospice and is currently under GIP status, awaiting transfer to residential hospice facility   Kathie Dike MD [facility]   If 7PM-7AM, please contact night-coverage [email or web address]   11/26/2019, 8:22 PM

## 2019-11-26 NOTE — Discharge Summary (Signed)
Physician Discharge Summary  Gregory Moreno JYN:[id number] DOB: [birth year] DOA: 11/23/2019  PCP: [facility]  Admit date: 11/23/2019 Discharge date: 11/26/2019  Admitted From: Home Disposition: Residential hospice  Recommendations for Outpatient Follow-up:  1. Patient has been accepted by hospice and is currently under GIP status   Discharge Condition: Terminal CODE STATUS: DNR, comfort measures Diet recommendation: Regular diet for comfort  Brief/Interim Summary: 24 year old male with a history of glioblastoma multiforme admitted to the hospital with worsening mental status.  Differential diagnosis included possible CNS infection versus seizure versus progression of underlying malignancy.  He was treated with broad-spectrum antibiotics/antivirals, but failed to help any meaningful improvements.  After discussion with neurology, neuro-oncology it was felt that his symptoms were likely related to progression of his underlying malignancy.  After discussion with palliative care, family has elected to transition the patient to comfort measures.  He was seen by hospice and accepted with residential hospice.  He is currently GIP status and is awaiting a bed at residential hospice.  Discharge Diagnoses:  Principal Problem:   Acute encephalopathy Active Problems:   Right spastic hemiplegia (HCC)   Spastic hemiplegia affecting nondominant side (HCC)   Glioblastoma multiforme of temporal lobe (HCC)   Encounter for hospice care discussion    Discharge Instructions  Discharge Instructions    Diet - low sodium heart healthy   Complete by: As directed    Increase activity slowly   Complete by: As directed      Allergies as of 11/26/2019   No Known Allergies     Medication List    STOP taking these medications   Baclofen 5 MG Tabs   dexamethasone 4 MG tablet Commonly known as: DECADRON   dronabinol 5 MG capsule Commonly known as: Marinol   escitalopram 5  MG tablet Commonly known as: LEXAPRO   levETIRAcetam 250 MG tablet Commonly known as: Keppra   ondansetron 4 MG tablet Commonly known as: Zofran   pantoprazole 40 MG tablet Commonly known as: PROTONIX   Sarna lotion Generic drug: camphor-menthol   senna 8.6 MG tablet Commonly known as: SENOKOT   temozolomide 100 MG capsule Commonly known as: TEMODAR   traMADol 50 MG tablet Commonly known as: ULTRAM     TAKE these medications   dexamethasone 10 MG/ML injection Commonly known as: DECADRON Inject 1 mL (10 mg total) into the vein daily. Start taking on: November 27, 2019   levETIRacetam 1500 MG/100ML Soln Commonly known as: KEPPRA Inject 100 mLs (1,500 mg total) into the vein every 12 (twelve) hours. Start taking on: November 27, 2019       Contact information for after-discharge care    [facility] .   Service: Inpatient Hospice Contact information: [address] [phone]                 No Known Allergies  Consultations:  Palliative care   Procedures/Studies: CT HEAD WO CONTRAST  Result Date: 11/23/2019 CLINICAL DATA:  Delirium. Altered mental status with seizure-like activity. EXAM: CT HEAD WITHOUT CONTRAST TECHNIQUE: Contiguous axial images were obtained from the base of the skull through the vertex without intravenous contrast. COMPARISON:  MRI November 19, 2019. FINDINGS: Brain: The mass involving the right frontal white matter, corpus callosum, and right temporal lobe was better characterized on recent MRI with contrast, but appears grossly similar. Grossly similar cystic encephalomalacia in the left temporal lobe and edema in the left  frontal lobe. Similar rounding of the right temporal horn, likely represent a component of ventricular entrapment. Similar ventricular size. No acute hemorrhage. Absence of contrast precludes evaluation for IAC and brainstem finding seen on  prior MRI. Vascular: No hyperdense vessel or unexpected calcification. Skull: Left craniotomy. Sinuses/Orbits: No acute findings. Other: No mastoid effusions. IMPRESSION: 1. The mass involving the right frontal white matter, corpus callosum, and right temporal lobe appears grossly similar when comparing across modalities. MRI with contrast could better evaluate for progression. 2. Similar rounding of the right temporal horn, likely representing a component of ventricular entrapment. No progressive ventriculomegaly. 3. No specific evidence of new/acute superimposed abnormality. Electronically Signed   By: Margaretha Sheffield MD   On: 11/23/2019 10:21   MR Brain W Wo Contrast  Result Date: 11/19/2019 CLINICAL DATA:  Brain/CNS neoplasm, staging. EXAM: MRI HEAD WITHOUT AND WITH CONTRAST TECHNIQUE: Multiplanar, multiecho pulse sequences of the brain and surrounding structures were obtained without and with intravenous contrast. CONTRAST:  7.47mL GADAVIST GADOBUTROL 1 MMOL/ML IV SOLN COMPARISON:  09/30/2019 MRI head and prior. FINDINGS: Brain: Confluent T2/FLAIR hyperintense signal involving the left cerebrum and bifrontal region traversing the genu is less conspicuous than prior exam. Correlative restricted diffusion involving the left periatrial white matter and corpus callosum is grossly unchanged. Prior irregular enhancement predominantly involving the corpus callosum and left greater than right periventricular white matter has decreased (24:27). Persistent leptomeningeal enhancement involving the anterior brainstem, however less conspicuous than prior exam. Prior leptomeningeal enhancement involving the bilateral IAC's is not demonstrated. Persistent ependymal enhancement involving the right atrium. Masslike confluent T2/FLAIR hyperintense signal involving the right corpus callosum body/splenium, centrum semiovale and right corona radiata is more conspicuous than prior exam measuring up to 5.0 x 3.8 cm (17:19,  previously 1.7 cm when remeasured). New micronodular enhancement is also seen within this region (24:34, 35). Associated mild restricted diffusion has increased. Masslike confluent T2 hyperintense signal involving the mesial right temporal lobe/hippocampus is also increased with subtle associated heterogenous enhancement (25:14). Associated restricted diffusion has also increased. Redemonstration of left temporal cystic encephalomalacia. Extension of T2 hyperintense signal along the left brainstem is unchanged, likely wallerian degeneration. Leftward midline shift of 2-3 mm is unchanged. Partial effacement of the right lateral ventricle. Increased conspicuity of the right temporal horn may reflect entrapment. Scattered foci of SWI signal dropout more conspicuous within the left periatrial region. Vascular: Proximally preserved major intracranial flow voids. Skull and upper cervical spine: Sequela of left craniotomy. Sinuses/Orbits: Normal orbits. Pneumatized paranasal sinuses. No mastoid effusion. Other: None. IMPRESSION: Bifrontal and left cerebrum T2 hyperintense signal with associated irregular enhancement is less conspicuous than prior exam. Masslike T2 hyperintense signal involving the right corpus callosum, centrum semiovale and corona radiata is more conspicuous than prior exam with new micronodular enhancement. Masslike T2 hyperintense signal involving the right temporal lobe with associated heterogenous enhancement is increased since prior exam. Anterior brainstem and bilateral IAC leptomeningeal enhancement, less conspicuous than prior exam. Electronically Signed   By: Primitivo Gauze M.D.   On: 11/19/2019 18:47   DG Chest Port 1 View  Result Date: 11/23/2019 CLINICAL DATA:  Altered mental status. EXAM: PORTABLE CHEST 1 VIEW COMPARISON:  Prior chest radiographs 10/15/2018 and earlier. FINDINGS: Shallow inspiration radiograph. Heart size within normal limits. No appreciable airspace consolidation or  pulmonary edema. No evidence of pleural effusion or pneumothorax. No acute bony abnormality identified. A thoracolumbar levocurvature may be positional. IMPRESSION: No evidence of acute cardiopulmonary abnormality. Electronically Signed   By: Marylyn Ishihara  Golden DO   On: 11/23/2019 09:48   EEG adult  Result Date: 11/24/2019 Lora Havens, MD     11/24/2019  9:13 AM Patient Name: Gregory Moreno MRN: [id number] Epilepsy Attending: Lora Havens Referring Physician/Provider: Dr. Heath Lark Date: 11/23/2019 Duration: 22.49 mins Patient history: 24 year old male with history of glioblastoma involving left temporal lobe who presented with altered mental status.  EEG to evaluate for seizures. Level of alertness: asleep AEDs during EEG study: Keppra, Ativan Technical aspects: This EEG study was done with scalp electrodes positioned according to the 10-20 International system of electrode placement. Electrical activity was acquired at a sampling rate of 500Hz  and reviewed with a high frequency filter of 70Hz  and a low frequency filter of 1Hz . EEG data were recorded continuously and digitally stored. Description: Sleep was characterized by asymmetric sleep spindles (12 to 14 Hz), L>R, maximal frontocentral region.  EEG showed continuous 3 to 6 Hz theta-delta slowing in left hemisphere with overriding sharply contoured 15-18Hz  beta activity in left frontal region consistent with breach rhythm. Hyperventilation and photic stimulation were not performed.   ABNORMALITY -Continuous slow, left hemisphere -Breach rhythm, left frontal region IMPRESSION: This study showed evidence of cortical dysfunction in left hemisphere, maximal left frontal region consistent with underlying tumor and craniotomy.   No seizures or epileptiform discharges were seen throughout the recording. Lora Havens   DG Lumbar Puncture Fluoro Guide  Result Date: 11/23/2019 CLINICAL DATA:  Fever and worsening mental status. History of brain tumor with  prior resection. EXAM: DIAGNOSTIC LUMBAR PUNCTURE UNDER FLUOROSCOPIC GUIDANCE FLUOROSCOPY TIME:  Fluoroscopy Time:  0 minutes, 24 seconds Radiation Exposure Index (if provided by the fluoroscopic device): 3.2 mGy Number of Acquired Spot Images: 0 PROCEDURE: The patient is not able to consent for himself due to altered mental status. I discussed the risks (including hemorrhage, infection, headache, and nerve damage, among others), benefits, and alternatives to fluoroscopically guided lumbar puncture with the patient's mother, who has healthcare power of attorney. We specifically discussed the high technical likelihood of success of the procedure. The patient's mother understood and elected for the patient to undergo the procedure. Standard time-out was employed. Following sterile skin prep and local anesthetic administration consisting of 1 percent lidocaine, a 22 gauge spinal needle was advanced without difficulty into the thecal sac at the at the L4-5 level under fluoroscopic guidance. Very slightly yellowish tinged CSF was returned. Because of issues with patient agitation, I did not consider it feasible to turn the patient onto his side to obtain an opening pressure measurement. However, CSF flow was mildly sluggish and subjectively I do not feel that there is elevated CSF pressure. 12 cc of CSF was collected. The needle was subsequently removed and the skin cleansed and bandaged. No immediate complications were observed. IMPRESSION: 1. Technically successful fluoroscopically guided lumbar puncture yielding 12 cc of CSF. Electronically Signed   By: Van Clines M.D.   On: 11/23/2019 15:30       Subjective: Somnolent, unresponsive  Discharge Exam: Vitals:   11/25/19 1329 11/25/19 2026 11/25/19 2355 11/26/19 0640  BP: (!) 105/52  (!) 128/94 122/85  Pulse: (!) 104  (!) 115 (!) 119  Resp: 20  (!) 21 (!) 21  Temp:   99.5 F (37.5 C) 99.9 F (37.7 C)  TempSrc:   Axillary Axillary  SpO2: 99% 98%  99% 99%  Weight:      Height:        General: Somnolent, unresponsive Cardiovascular: Tachycardic S1/S2 +,  no rubs, no gallops Respiratory: Increased work of breathing Abdominal: Soft, NT, ND, bowel sounds + Extremities: no edema, no cyanosis    The results of significant diagnostics from this hospitalization (including imaging, microbiology, ancillary and laboratory) are listed below for reference.     Microbiology: Recent Results (from the past 240 hour(s))  Culture, blood (Routine x 2)     Status: None (Preliminary result)   Collection Time: 11/23/19  9:20 AM   Specimen: BLOOD LEFT HAND  Result Value Ref Range Status   Specimen Description BLOOD LEFT HAND  Final   Special Requests   Final    BOTTLES DRAWN AEROBIC AND ANAEROBIC Blood Culture adequate volume   Culture   Final    NO GROWTH 3 DAYS Performed at [facility], [address], [place], [place] [address]    Report Status PENDING  Incomplete  Respiratory Panel by RT PCR (Flu A&B, Covid) - Nasopharyngeal Swab     Status: None   Collection Time: 11/23/19  9:48 AM   Specimen: Nasopharyngeal Swab  Result Value Ref Range Status   SARS Coronavirus 2 by RT PCR NEGATIVE NEGATIVE Final    Comment: (NOTE) SARS-CoV-2 target nucleic acids are NOT DETECTED.  The SARS-CoV-2 RNA is generally detectable in upper respiratoy specimens during the acute phase of infection. The lowest concentration of SARS-CoV-2 viral copies this assay can detect is 131 copies/mL. A negative result does not preclude SARS-Cov-2 infection and should not be used as the sole basis for treatment or other patient management decisions. A negative result may occur with  improper specimen collection/handling, submission of specimen other than nasopharyngeal swab, presence of viral mutation(s) within the areas targeted by this assay, and inadequate number of viral copies (<131 copies/mL). A negative result must be combined with clinical observations,  patient history, and epidemiological information. The expected result is Negative.  Fact Sheet for Patients:  PinkCheek.be  Fact Sheet for Healthcare Providers:  GravelBags.it  This test is no t yet approved or cleared by the [place] FDA and  has been authorized for detection and/or diagnosis of SARS-CoV-2 by FDA under an Emergency Use Authorization (EUA). This EUA will remain  in effect (meaning this test can be used) for the duration of the COVID-19 declaration under Section 564(b)(1) of the Act, 21 U.S.C. section 360bbb-3(b)(1), unless the authorization is terminated or revoked sooner.     Influenza A by PCR NEGATIVE NEGATIVE Final   Influenza B by PCR NEGATIVE NEGATIVE Final    Comment: (NOTE) The Xpert Xpress SARS-CoV-2/FLU/RSV assay is intended as an aid in  the diagnosis of influenza from Nasopharyngeal swab specimens and  should not be used as a sole basis for treatment. Nasal washings and  aspirates are unacceptable for Xpert Xpress SARS-CoV-2/FLU/RSV  testing.  Fact Sheet for Patients: PinkCheek.be  Fact Sheet for Healthcare Providers: GravelBags.it  This test is not yet approved or cleared by the [place] FDA and  has been authorized for detection and/or diagnosis of SARS-CoV-2 by  FDA under an Emergency Use Authorization (EUA). This EUA will remain  in effect (meaning this test can be used) for the duration of the  Covid-19 declaration under Section 564(b)(1) of the Act, 21  U.S.C. section 360bbb-3(b)(1), unless the authorization is  terminated or revoked. Performed at [facility], [address] [place], [place] [address]   Culture, blood (Routine x 2)     Status: None (Preliminary result)   Collection Time: 11/23/19 10:17 AM   Specimen:  BLOOD  Result Value Ref Range Status   Specimen Description BLOOD RIGHT ANTECUBITAL  Final    Special Requests   Final    BOTTLES DRAWN AEROBIC AND ANAEROBIC Blood Culture adequate volume   Culture   Final    NO GROWTH 3 DAYS Performed at [facility], [address] [place], [place] [address]    Report Status PENDING  Incomplete  CSF culture with Stat gram stain     Status: None (Preliminary result)   Collection Time: 11/23/19  2:32 PM   Specimen: CSF; Cerebrospinal Fluid  Result Value Ref Range Status   Specimen Description   Final    CSF Performed at [facility], [address], [place], [place] [address]    Special Requests   Final    Immunocompromised Performed at [facility], [address], [place], [place] [address]    Gram Stain NO WBC SEEN NO ORGANISMS SEEN CYTOSPIN SMEAR   Final   Culture   Final    NO GROWTH 2 DAYS Performed at [facility], [place] [address], [place], [place] [address]    Report Status PENDING  Incomplete     Labs: BNP (last 3 results) No results for input(s): BNP in the last 8760 hours. Basic Metabolic Panel: Recent Labs  Lab 11/23/19 0948 11/24/19 0638 11/25/19 0513  NA 138 138 136  K 3.4* 3.6 3.3*  CL 102 102 100  CO2 25 26 26   GLUCOSE 97 88 106*  BUN 20 14 9   CREATININE 0.90 0.72 0.67  CALCIUM 9.1 8.7* 8.5*  MG  --  1.9  --    Liver Function Tests: Recent Labs  Lab 11/23/19 0948  AST 24  ALT 28  ALKPHOS 42  BILITOT 0.8  PROT 6.6  ALBUMIN 3.9   No results for input(s): LIPASE, AMYLASE in the last 168 hours. No results for input(s): AMMONIA in the last 168 hours. CBC: Recent Labs  Lab 11/23/19 0948 11/24/19 0638 11/25/19 0513  WBC 13.6* 8.1 7.8  NEUTROABS 11.7*  --   --   HGB 15.1 13.1 12.4*  HCT 44.6 40.7 37.5*  MCV 90.8 94.4 93.5  PLT 143* 123* 123*   Cardiac Enzymes: No results for input(s): CKTOTAL, CKMB, CKMBINDEX, TROPONINI in the last 168 hours. BNP: Invalid input(s): POCBNP CBG: Recent Labs  Lab 11/25/19 0743 11/25/19 0834 11/25/19 1119 11/25/19 1642 11/26/19 1613  GLUCAP 68* 102*  78 120* 103*   D-Dimer No results for input(s): DDIMER in the last 72 hours. Hgb A1c No results for input(s): HGBA1C in the last 72 hours. Lipid Profile No results for input(s): CHOL, HDL, LDLCALC, TRIG, CHOLHDL, LDLDIRECT in the last 72 hours. Thyroid function studies No results for input(s): TSH, T4TOTAL, T3FREE, THYROIDAB in the last 72 hours.  Invalid input(s): FREET3 Anemia work up No results for input(s): VITAMINB12, FOLATE, FERRITIN, TIBC, IRON, RETICCTPCT in the last 72 hours. Urinalysis    Component Value Date/Time   COLORURINE YELLOW 11/23/2019 0924   APPEARANCEUR CLEAR 11/23/2019 0924   LABSPEC 1.021 11/23/2019 0924   PHURINE 5.0 11/23/2019 0924   GLUCOSEU NEGATIVE 11/23/2019 0924   HGBUR NEGATIVE 11/23/2019 0924   BILIRUBINUR NEGATIVE 11/23/2019 0924   KETONESUR NEGATIVE 11/23/2019 0924   PROTEINUR 30 (A) 11/23/2019 0924   NITRITE NEGATIVE 11/23/2019 0924   LEUKOCYTESUR NEGATIVE 11/23/2019 0924   Sepsis Labs Invalid input(s): PROCALCITONIN,  WBC,  LACTICIDVEN Microbiology Recent Results (from the past 240 hour(s))  Culture, blood (Routine x 2)  Status: None (Preliminary result)   Collection Time: 11/23/19  9:20 AM   Specimen: BLOOD LEFT HAND  Result Value Ref Range Status   Specimen Description BLOOD LEFT HAND  Final   Special Requests   Final    BOTTLES DRAWN AEROBIC AND ANAEROBIC Blood Culture adequate volume   Culture   Final    NO GROWTH 3 DAYS Performed at [facility], [address] [place], [place] [address]    Report Status PENDING  Incomplete  Respiratory Panel by RT PCR (Flu A&B, Covid) - Nasopharyngeal Swab     Status: None   Collection Time: 11/23/19  9:48 AM   Specimen: Nasopharyngeal Swab  Result Value Ref Range Status   SARS Coronavirus 2 by RT PCR NEGATIVE NEGATIVE Final    Comment: (NOTE) SARS-CoV-2 target nucleic acids are NOT DETECTED.  The SARS-CoV-2 RNA is generally detectable in upper respiratoy specimens during the acute  phase of infection. The lowest concentration of SARS-CoV-2 viral copies this assay can detect is 131 copies/mL. A negative result does not preclude SARS-Cov-2 infection and should not be used as the sole basis for treatment or other patient management decisions. A negative result may occur with  improper specimen collection/handling, submission of specimen other than nasopharyngeal swab, presence of viral mutation(s) within the areas targeted by this assay, and inadequate number of viral copies (<131 copies/mL). A negative result must be combined with clinical observations, patient history, and epidemiological information. The expected result is Negative.  Fact Sheet for Patients:  PinkCheek.be  Fact Sheet for Healthcare Providers:  GravelBags.it  This test is no t yet approved or cleared by the [place] FDA and  has been authorized for detection and/or diagnosis of SARS-CoV-2 by FDA under an Emergency Use Authorization (EUA). This EUA will remain  in effect (meaning this test can be used) for the duration of the COVID-19 declaration under Section 564(b)(1) of the Act, 21 U.S.C. section 360bbb-3(b)(1), unless the authorization is terminated or revoked sooner.     Influenza A by PCR NEGATIVE NEGATIVE Final   Influenza B by PCR NEGATIVE NEGATIVE Final    Comment: (NOTE) The Xpert Xpress SARS-CoV-2/FLU/RSV assay is intended as an aid in  the diagnosis of influenza from Nasopharyngeal swab specimens and  should not be used as a sole basis for treatment. Nasal washings and  aspirates are unacceptable for Xpert Xpress SARS-CoV-2/FLU/RSV  testing.  Fact Sheet for Patients: PinkCheek.be  Fact Sheet for Healthcare Providers: GravelBags.it  This test is not yet approved or cleared by the [place] FDA and  has been authorized for detection and/or diagnosis of  SARS-CoV-2 by  FDA under an Emergency Use Authorization (EUA). This EUA will remain  in effect (meaning this test can be used) for the duration of the  Covid-19 declaration under Section 564(b)(1) of the Act, 21  U.S.C. section 360bbb-3(b)(1), unless the authorization is  terminated or revoked. Performed at [facility], [address], [place], [place] [address]   Culture, blood (Routine x 2)     Status: None (Preliminary result)   Collection Time: 11/23/19 10:17 AM   Specimen: BLOOD  Result Value Ref Range Status   Specimen Description BLOOD RIGHT ANTECUBITAL  Final   Special Requests   Final    BOTTLES DRAWN AEROBIC AND ANAEROBIC Blood Culture adequate volume   Culture   Final    NO GROWTH 3 DAYS Performed at [facility], [address], [place], [place] [address]    Report Status  PENDING  Incomplete  CSF culture with Stat gram stain     Status: None (Preliminary result)   Collection Time: 11/23/19  2:32 PM   Specimen: CSF; Cerebrospinal Fluid  Result Value Ref Range Status   Specimen Description   Final    CSF Performed at [facility], [address], [place], [place] [address]    Special Requests   Final    Immunocompromised Performed at [facility], [address], [place], [place] [address]    Gram Stain NO WBC SEEN NO ORGANISMS SEEN CYTOSPIN SMEAR   Final   Culture   Final    NO GROWTH 2 DAYS Performed at [facility], [place] [address] [place], [place] [address]    Report Status PENDING  Incomplete     Time coordinating discharge: 32mins  SIGNED:   Kathie Dike, MD  [facility] 11/26/2019, 8:17 PM   If 7PM-7AM, please contact night-coverage [email or web address]

## 2019-11-26 NOTE — Consult Note (Signed)
Consultation Note Date: 11/26/2019   Patient Name: Gregory Moreno  DOB: 1995/12/27  MRN: [id number]  Age / Sex: 24 y.o., male  PCP: [facility] [place] Referring Physician: Kathie Dike, MD  Reason for Consultation: Establishing goals of care and Psychosocial/spiritual support  HPI/Patient Profile: 24 y.o. male  with past medical history of glioblastoma multiforme admitted to the hospital with worsening mental status, admitted on 11/23/2019 with acute encephalopathy.   Clinical Assessment and Goals of Care: Gregory Moreno, Gregory Moreno, is lying quietly in bed.  He does not respond in any meaningful way to voice or touch.  His respiratory rate is high, but overall he looks comfortable.  Present today at bedside is his father Gregory Moreno.  Gregory Moreno and I talked about Gregory Moreno's condition, needs, goals of care.  Gregory Moreno shares that they are ready for comfort measures only, and are agreeable to comfort medications.  We talked about the use of morphine for breathing, again Gregory Moreno is agreeable.   We talked about the benefits of hospice care for supporting family throughout the next year.  I shared that Gregory Moreno is likely too unstable to leave the hospital.  Gregory Moreno states understanding and agreement for hospice referral.  Conversation with attending, bedside nursing staff, transition of care team related to patient condition, needs, goals of care, hospice referral.  HCPOA   NEXT OF KIN -  Father and mother make choices as a team.     SUMMARY OF RECOMMENDATIONS   Comfort and dignity at end-of-life, let nature take its course Residential hospice referral Anticipate too unstable to discharge to hospice, GIP status Anticipate in hospital death   Code Status/Advance Care Planning:  DNR  Symptom Management:   Per hospitalist, no additional needs at this time.   Palliative Prophylaxis:   Frequent Pain  Assessment, Oral Care and Turn Reposition  Additional Recommendations (Limitations, Scope, Preferences):  Full Comfort Care  Psycho-social/Spiritual:   Desire for further Chaplaincy support:yes  Additional Recommendations: Caregiving  Support/Resources and Education on Hospice  Prognosis:   Hours - Days, anticipated based on severity of illness, no meaningful by mouth intake over the last 48 hours, family's desire to focus on comfort only  Discharge Planning: Residential hospice referral, anticipate too unstable for transfer.  Anticipate in hospital death      Primary Diagnoses: Present on Admission: . Acute encephalopathy . Glioblastoma multiforme of temporal lobe ([place]) . Spastic hemiplegia affecting nondominant side ([place]) . Right spastic hemiplegia ([place])   I have reviewed the medical record, interviewed the patient and family, and examined the patient. The following aspects are pertinent.  Past Medical History:  Diagnosis Date  . Glioblastoma ([place])    Social History   Socioeconomic History  . Marital status: Single    Spouse name: Not on file  . Number of children: Not on file  . Years of education: Not on file  . Highest education level: Not on file  Occupational History  . Not on file  Tobacco Use  . Smoking status:  Never Smoker  . Smokeless tobacco: Never Used  Substance and Sexual Activity  . Alcohol use: Not on file  . Drug use: Not on file  . Sexual activity: Not on file  Other Topics Concern  . Not on file  Social History Narrative  . Not on file   Social Determinants of Health   Financial Resource Strain:   . Difficulty of Paying Living Expenses: Not on file  Food Insecurity:   . Worried About Charity fundraiser in the Last Year: Not on file  . Ran Out of Food in the Last Year: Not on file  Transportation Needs:   . Lack of Transportation (Medical): Not on file  . Lack of Transportation (Non-Medical): Not on file  Physical Activity:   .  Days of Exercise per Week: Not on file  . Minutes of Exercise per Session: Not on file  Stress:   . Feeling of Stress : Not on file  Social Connections:   . Frequency of Communication with Friends and Family: Not on file  . Frequency of Social Gatherings with Friends and Family: Not on file  . Attends Religious Services: Not on file  . Active Member of Clubs or Organizations: Not on file  . Attends Archivist Meetings: Not on file  . Marital Status: Not on file   History reviewed. No pertinent family history. Scheduled Meds: . dexamethasone (DECADRON) injection  10 mg Intravenous Q24H  . escitalopram  5 mg Oral QHS   Continuous Infusions: . sodium chloride 500 mL (11/23/19 2252)  . acyclovir 790 mg (11/26/19 0640)  . cefTRIAXone (ROCEPHIN)  IV 2 g (11/26/19 0400)  . famotidine (PEPCID) IV 20 mg (11/26/19 0942)  . levETIRAcetam 1,500 mg (11/26/19 0304)  . vancomycin 1,000 mg (11/26/19 0458)   PRN Meds:.sodium chloride, acetaminophen **OR** acetaminophen, camphor-menthol, LORazepam, morphine injection, ondansetron **OR** ondansetron (ZOFRAN) IV Medications Prior to Admission:  Prior to Admission medications   Medication Sig Start Date End Date Taking? Authorizing Provider  Baclofen 5 MG TABS Take 1 tablet by mouth daily as needed.  12/25/18  Yes [provider]  camphor-menthol Timoteo Ace) lotion Apply 1 application topically as needed for itching. 06/04/19  Yes Raulkar, Clide Deutscher, MD  dexamethasone (DECADRON) 4 MG tablet Take 1 tablet (4 mg total) by mouth daily. 11/02/19  Yes Vaslow, Acey Lav, MD  dronabinol (MARINOL) 5 MG capsule Take 1 capsule (5 mg total) by mouth 2 (two) times daily before lunch and supper. 10/03/19  Yes Lane Hacker L, DO  escitalopram (LEXAPRO) 5 MG tablet Take 1 tablet (5 mg total) by mouth at bedtime. 10/15/19  Yes Vaslow, Acey Lav, MD  levETIRAcetam (KEPPRA) 250 MG tablet Take 1 tablet (250 mg total) by mouth 2 (two) times daily. 09/24/19   Yes Vaslow, Acey Lav, MD  ondansetron (ZOFRAN) 4 MG tablet Take 1 tablet (4 mg total) by mouth every 8 (eight) hours as needed for nausea or vomiting. Patient taking differently: Take 4 mg by mouth in the morning and at bedtime.  11/02/19  Yes Vaslow, Acey Lav, MD  pantoprazole (PROTONIX) 40 MG tablet Take 1 tablet (40 mg total) by mouth daily. 11/16/19  Yes Vaslow, Acey Lav, MD  senna (SENOKOT) 8.6 MG tablet Take 2 tablets by mouth daily as needed for constipation.    Yes [provider]  temozolomide (TEMODAR) 100 MG capsule TAKE 1 CAPSULE BY MOUTH ONCE DAILY. MAY TAKE ON EMPTY STOMACH TO DECREASE NAUSEA AND VOMITING Patient taking  differently: Take 100 mg by mouth daily.  11/20/19  Yes Vaslow, Acey Lav, MD  traMADol (ULTRAM) 50 MG tablet Take 1 tablet (50 mg total) by mouth every 6 (six) hours as needed for severe pain. 11/16/19  Yes Vaslow, Acey Lav, MD  escitalopram (LEXAPRO) 5 MG tablet TAKE 1 TABLET BY MOUTH EVERYDAY AT BEDTIME 02/20/19   Raulkar, Clide Deutscher, MD   No Known Allergies Review of Systems  Unable to perform ROS: Acuity of condition    Physical Exam Vitals and nursing note reviewed.  Constitutional:      General: He is not in acute distress.    Appearance: He is ill-appearing.  HENT:     Head: Atraumatic.  Cardiovascular:     Rate and Rhythm: Normal rate.  Pulmonary:     Effort: Pulmonary effort is normal. No respiratory distress.  Abdominal:     General: Abdomen is flat.     Palpations: Abdomen is soft.  Skin:    General: Skin is warm and dry.  Neurological:     Comments: Does not respond in meaningful way to voice or touch     Vital Signs: BP 122/85 (BP Location: Right Arm)   Pulse (!) 119   Temp 99.9 F (37.7 C) (Axillary)   Resp (!) 21   Ht 5\' 9"  (1.753 m)   Wt 78.8 kg   SpO2 99%   BMI 25.65 kg/m  Pain Scale: PAINAD   Pain Score: 0-No pain   SpO2: SpO2: 99 % O2 Device:SpO2: 99 % O2 Flow Rate: .O2 Flow Rate (L/min): 2 L/min  IO:  Intake/output summary:   Intake/Output Summary (Last 24 hours) at 11/26/2019 1307 Last data filed at 11/26/2019 0049 Gross per 24 hour  Intake --  Output 1250 ml  Net -1250 ml    LBM:   Baseline Weight: Weight: 79.2 kg Most recent weight: Weight: 78.8 kg     Palliative Assessment/Data:   Flowsheet Rows     Most Recent Value  Intake Tab  Referral Department Hospitalist  Unit at Time of Referral Cardiac/Telemetry Unit  Palliative Care Primary Diagnosis Cancer  Date Notified 11/25/19  Palliative Care Type Return patient Palliative Care  Reason for referral Clarify Goals of Care  Date of Admission 11/23/19  Date first seen by Palliative Care 11/26/19  # of days Palliative referral response time 1 Day(s)  # of days IP prior to Palliative referral 2  Clinical Assessment  Palliative Performance Scale Score 10%  Pain Max last 24 hours Not able to report  Pain Min Last 24 hours Not able to report  Dyspnea Max Last 24 Hours Not able to report  Dyspnea Min Last 24 hours Not able to report  Psychosocial & Spiritual Assessment  Palliative Care Outcomes      Time In: 1050 Time Out: 1120 Time Total: 30 minutes  Greater than 50%  of this time was spent counseling and coordinating care related to the above assessment and plan.  Signed by: Drue Novel, NP   Please contact Palliative Medicine Team phone at [phone] for questions and concerns.  For individual provider: See Shea Evans

## 2019-11-26 NOTE — Plan of Care (Signed)
  Problem: Education: Goal: Knowledge of General Education information will improve Description: Including pain rating scale, medication(s)/side effects and non-pharmacologic comfort measures Outcome: Not Progressing   

## 2019-11-26 NOTE — TOC Progression Note (Signed)
Transition of Care [facility]) - Progression Note    Patient Details  Name: Gregory Moreno MRN: [id number] Date of Birth: August 21, 1995  [facility]) CM/SW Contact  Boneta Lucks, RN Phone Number: 11/26/2019, 3:49 PM  Clinical Narrative:  Hospice consent complete. Chart can flip to GIP, RN and MD updated.        Barriers to Discharge: Hospice Bed not available

## 2019-11-27 ENCOUNTER — Encounter (HOSPITAL_COMMUNITY): Payer: Self-pay | Admitting: Internal Medicine

## 2019-11-27 ENCOUNTER — Other Ambulatory Visit: Payer: Self-pay

## 2019-11-27 LAB — GLUCOSE, CAPILLARY: Glucose-Capillary: 68 mg/dL — ABNORMAL LOW (ref 70–99)

## 2019-11-27 LAB — CSF CULTURE W GRAM STAIN
Culture: NO GROWTH
Gram Stain: NONE SEEN

## 2019-11-27 LAB — HSV 1/2 PCR, CSF
HSV-1 DNA: NEGATIVE
HSV-2 DNA: NEGATIVE

## 2019-11-27 NOTE — Plan of Care (Signed)
  Problem: Education: Goal: Knowledge of General Education information will improve Description: Including pain rating scale, medication(s)/side effects and non-pharmacologic comfort measures Outcome: Not Progressing   Problem: Health Behavior/Discharge Planning: Goal: Ability to manage health-related needs will improve Outcome: Not Progressing   Problem: Clinical Measurements: Goal: Diagnostic test results will improve Outcome: Not Progressing

## 2019-11-27 NOTE — Progress Notes (Signed)
PROGRESS NOTE    Gregory Moreno  DZH:[id number] DOB: [birth year] DOA: 11/26/2019 PCP: [facility]    Brief Narrative:  24 year old male with a history of glioblastoma multiforme admitted to the hospital with worsening mental status.  Differential diagnosis included possible CNS infection versus seizure versus progression of underlying malignancy.  He was treated with broad-spectrum antibiotics/antivirals, but failed to help any meaningful improvements.  After discussion with neurology, neuro-oncology it was felt that his symptoms were likely related to progression of his underlying malignancy.  After discussion with palliative care, family has elected to transition the patient to comfort measures.  He was seen by hospice and accepted with residential hospice.  He is currently GIP status and is awaiting a bed at residential hospice.  Patient is currently on morphine infusion for respiratory distress and currently appears comfortable. He is still somnolent and is unable to take any po intake.   Assessment & Plan:   Active Problems:   Glioblastoma multiforme (HCC)   Subjective: Patient is lethargic but appears more comfortable today. No po intake due to mental status  Objective: Vitals:   11/26/19 2128 11/26/19 2230  BP:  (!) 106/54  Pulse:  89  Resp:  17  Temp:  98.7 F (37.1 C)  TempSrc:  Axillary  SpO2: 97% 98%    Intake/Output Summary (Last 24 hours) at 11/27/2019 1843 Last data filed at 11/27/2019 0554 Gross per 24 hour  Intake --  Output 1650 ml  Net -1650 ml   There were no vitals filed for this visit.  Examination:  General exam: Appears calm and comfortable, somnolent  Respiratory system: Clear to auscultation. Respiratory effort has improved today Cardiovascular system: S1 & S2 heard, RRR. No JVD, murmurs, rubs, gallops or clicks. No pedal edema.     Data Reviewed: I have personally reviewed following labs and imaging  studies  CBC: Recent Labs  Lab 11/23/19 0948 11/24/19 0638 11/25/19 0513  WBC 13.6* 8.1 7.8  NEUTROABS 11.7*  --   --   HGB 15.1 13.1 12.4*  HCT 44.6 40.7 37.5*  MCV 90.8 94.4 93.5  PLT 143* 123* 419*   Basic Metabolic Panel: Recent Labs  Lab 11/23/19 0948 11/24/19 0638 11/25/19 0513  NA 138 138 136  K 3.4* 3.6 3.3*  CL 102 102 100  CO2 25 26 26   GLUCOSE 97 88 106*  BUN 20 14 9   CREATININE 0.90 0.72 0.67  CALCIUM 9.1 8.7* 8.5*  MG  --  1.9  --    GFR: Estimated Creatinine Clearance: 143.6 mL/min (by C-G formula based on SCr of 0.67 mg/dL). Liver Function Tests: Recent Labs  Lab 11/23/19 0948  AST 24  ALT 28  ALKPHOS 42  BILITOT 0.8  PROT 6.6  ALBUMIN 3.9   No results for input(s): LIPASE, AMYLASE in the last 168 hours. No results for input(s): AMMONIA in the last 168 hours. Coagulation Profile: Recent Labs  Lab 11/23/19 0948  INR 1.0   Cardiac Enzymes: No results for input(s): CKTOTAL, CKMB, CKMBINDEX, TROPONINI in the last 168 hours. BNP (last 3 results) No results for input(s): PROBNP in the last 8760 hours. HbA1C: No results for input(s): HGBA1C in the last 72 hours. CBG: Recent Labs  Lab 11/25/19 0834 11/25/19 1119 11/25/19 1642 11/26/19 1613 11/27/19 1136  GLUCAP 102* 78 120* 103* 68*   Lipid Profile: No results for input(s): CHOL, HDL, LDLCALC, TRIG, CHOLHDL, LDLDIRECT in the last 72 hours. Thyroid Function Tests: No results for input(s):  TSH, T4TOTAL, FREET4, T3FREE, THYROIDAB in the last 72 hours. Anemia Panel: No results for input(s): VITAMINB12, FOLATE, FERRITIN, TIBC, IRON, RETICCTPCT in the last 72 hours. Sepsis Labs: Recent Labs  Lab 11/23/19 0948 11/23/19 1243  LATICACIDVEN 2.4* 2.6*    Recent Results (from the past 240 hour(s))  Culture, blood (Routine x 2)     Status: None (Preliminary result)   Collection Time: 11/23/19  9:20 AM   Specimen: BLOOD LEFT HAND  Result Value Ref Range Status   Specimen Description  BLOOD LEFT HAND  Final   Special Requests   Final    BOTTLES DRAWN AEROBIC AND ANAEROBIC Blood Culture adequate volume   Culture   Final    NO GROWTH 3 DAYS Performed at [facility], [address], [place], [place] [address]    Report Status PENDING  Incomplete  Respiratory Panel by RT PCR (Flu A&B, Covid) - Nasopharyngeal Swab     Status: None   Collection Time: 11/23/19  9:48 AM   Specimen: Nasopharyngeal Swab  Result Value Ref Range Status   SARS Coronavirus 2 by RT PCR NEGATIVE NEGATIVE Final    Comment: (NOTE) SARS-CoV-2 target nucleic acids are NOT DETECTED.  The SARS-CoV-2 RNA is generally detectable in upper respiratoy specimens during the acute phase of infection. The lowest concentration of SARS-CoV-2 viral copies this assay can detect is 131 copies/mL. A negative result does not preclude SARS-Cov-2 infection and should not be used as the sole basis for treatment or other patient management decisions. A negative result may occur with  improper specimen collection/handling, submission of specimen other than nasopharyngeal swab, presence of viral mutation(s) within the areas targeted by this assay, and inadequate number of viral copies (<131 copies/mL). A negative result must be combined with clinical observations, patient history, and epidemiological information. The expected result is Negative.  Fact Sheet for Patients:  PinkCheek.be  Fact Sheet for Healthcare Providers:  GravelBags.it  This test is no t yet approved or cleared by the [place] FDA and  has been authorized for detection and/or diagnosis of SARS-CoV-2 by FDA under an Emergency Use Authorization (EUA). This EUA will remain  in effect (meaning this test can be used) for the duration of the COVID-19 declaration under Section 564(b)(1) of the Act, 21 U.S.C. section 360bbb-3(b)(1), unless the authorization is terminated or revoked sooner.      Influenza A by PCR NEGATIVE NEGATIVE Final   Influenza B by PCR NEGATIVE NEGATIVE Final    Comment: (NOTE) The Xpert Xpress SARS-CoV-2/FLU/RSV assay is intended as an aid in  the diagnosis of influenza from Nasopharyngeal swab specimens and  should not be used as a sole basis for treatment. Nasal washings and  aspirates are unacceptable for Xpert Xpress SARS-CoV-2/FLU/RSV  testing.  Fact Sheet for Patients: PinkCheek.be  Fact Sheet for Healthcare Providers: GravelBags.it  This test is not yet approved or cleared by the [place] FDA and  has been authorized for detection and/or diagnosis of SARS-CoV-2 by  FDA under an Emergency Use Authorization (EUA). This EUA will remain  in effect (meaning this test can be used) for the duration of the  Covid-19 declaration under Section 564(b)(1) of the Act, 21  U.S.C. section 360bbb-3(b)(1), unless the authorization is  terminated or revoked. Performed at [facility], [address] [place], [place] [address]   Culture, blood (Routine x 2)     Status: None (Preliminary result)   Collection Time: 11/23/19 10:17 AM   Specimen: BLOOD  Result  Value Ref Range Status   Specimen Description BLOOD RIGHT ANTECUBITAL  Final   Special Requests   Final    BOTTLES DRAWN AEROBIC AND ANAEROBIC Blood Culture adequate volume   Culture   Final    NO GROWTH 3 DAYS Performed at [facility], [address], [place], [place] [address]    Report Status PENDING  Incomplete  CSF culture with Stat gram stain     Status: None   Collection Time: 11/23/19  2:32 PM   Specimen: CSF; Cerebrospinal Fluid  Result Value Ref Range Status   Specimen Description   Final    CSF Performed at [facility], [address], [place], [place] [address]    Special Requests   Final    Immunocompromised Performed at [facility], [address] [place], [place] [address]    Gram Stain NO WBC SEEN NO ORGANISMS  SEEN CYTOSPIN SMEAR   Final   Culture   Final    NO GROWTH 3 DAYS Performed at [facility], [place] [address], [place], [place] [address]    Report Status 11/27/2019 FINAL  Final         Radiology Studies: No results found.      Scheduled Meds: . dexamethasone  10 mg Intravenous Q24H   Continuous Infusions: . levETIRacetam 1,500 mg (11/27/19 1757)  . morphine       LOS: 1 day    Time spent: 58mins    Kathie Dike, MD [facility]   If 7PM-7AM, please contact night-coverage [email or web address]  11/27/2019, 6:43 PM

## 2019-11-28 DIAGNOSIS — G8111 Spastic hemiplegia affecting right dominant side: Secondary | ICD-10-CM

## 2019-11-28 DIAGNOSIS — R4701 Aphasia: Secondary | ICD-10-CM

## 2019-11-28 DIAGNOSIS — G939 Disorder of brain, unspecified: Secondary | ICD-10-CM

## 2019-11-28 DIAGNOSIS — G9340 Encephalopathy, unspecified: Secondary | ICD-10-CM

## 2019-11-28 LAB — CULTURE, BLOOD (ROUTINE X 2)
Culture: NO GROWTH
Culture: NO GROWTH
Special Requests: ADEQUATE
Special Requests: ADEQUATE

## 2019-11-28 LAB — SARS CORONAVIRUS 2 BY RT PCR (HOSPITAL ORDER, PERFORMED IN ~~LOC~~ HOSPITAL LAB): SARS Coronavirus 2: NEGATIVE

## 2019-11-28 MED ORDER — ONDANSETRON 4 MG PO TBDP: 4.0000 mg | ORAL_TABLET | Freq: Four times a day (QID) | ORAL | 0 refills | Status: AC | PRN

## 2019-11-28 NOTE — Discharge Summary (Signed)
Physician Discharge Summary  Gregory Moreno CBJ:[id number] DOB: 1995-10-24 DOA: 11/26/2019  PCP: [facility]  Admit date: 11/26/2019 Discharge date: 11/28/2019  Admitted From: home Disposition:  Residential hospice  Recommendations for Outpatient Follow-up:  1. Patient to be discharged to residential hospice for end of life care  Discharge Condition:Terminal CODE STATUS:DNR Diet recommendation: regular diet for comfort  Brief/Interim Summary: 24 year old male with a history of glioblastoma multiforme admitted to the hospital with worsening mental status. Differential diagnosis included possible CNS infection versus seizure versus progression of underlying malignancy. He was treated with broad-spectrum antibiotics/antivirals, but failed to make any meaningful improvements. After discussion with neurology, neuro-oncology it was felt that his symptoms were likely related to progression of his underlying malignancy. After discussion with palliative care, family has elected to transition the patient to comfort measures. He was seen by hospice and accepted with residential hospice. He will be discharged today to residential hospice for end-of-life care  Currently, patient is receiving morphine infusion and appears to be relatively comfortable.  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   Palliative care by specialist   Failure to thrive in adult   Acute encephalopathy   Glioblastoma multiforme [facility])    Discharge Instructions  Discharge Instructions    Diet - low sodium heart healthy   Complete by: As directed    Increase activity slowly   Complete by: As directed      Allergies as of 11/28/2019   No Known Allergies     Medication List    STOP taking these  medications   dexamethasone 10 MG/ML injection Commonly known as: DECADRON   levETIRacetam 1500 MG/100ML Soln Commonly known as: KEPPRA     TAKE these medications   ondansetron 4 MG disintegrating tablet Commonly known as: ZOFRAN-ODT Take 1 tablet (4 mg total) by mouth every 6 (six) hours as needed for nausea.       No Known Allergies  Consultations:     Procedures/Studies: CT HEAD WO CONTRAST  Result Date: 11/23/2019 CLINICAL DATA:  Delirium. Altered mental status with seizure-like activity. EXAM: CT HEAD WITHOUT CONTRAST TECHNIQUE: Contiguous axial images were obtained from the base of the skull through the vertex without intravenous contrast. COMPARISON:  MRI November 19, 2019. FINDINGS: Brain: The mass involving the right frontal white matter, corpus callosum, and right temporal lobe was better characterized on recent MRI with contrast, but appears grossly similar. Grossly similar cystic encephalomalacia in the left temporal lobe and edema in the left frontal lobe. Similar rounding of the right temporal horn, likely represent a component of ventricular entrapment. Similar ventricular size. No acute hemorrhage. Absence of contrast precludes evaluation for IAC and brainstem finding seen on prior MRI. Vascular: No hyperdense vessel or unexpected calcification. Skull: Left craniotomy. Sinuses/Orbits: No acute findings. Other: No mastoid effusions. IMPRESSION: 1. The mass involving the right frontal white matter, corpus callosum, and right temporal lobe appears grossly similar when comparing across modalities. MRI with contrast could better evaluate for progression. 2. Similar rounding of the right temporal horn, likely representing a component of ventricular entrapment. No progressive ventriculomegaly. 3. No specific evidence of new/acute superimposed abnormality. Electronically Signed   By: Margaretha Sheffield MD   On: 11/23/2019 10:21   MR Brain W Wo Contrast  Result Date:  11/19/2019 CLINICAL  DATA:  Brain/CNS neoplasm, staging. EXAM: MRI HEAD WITHOUT AND WITH CONTRAST TECHNIQUE: Multiplanar, multiecho pulse sequences of the brain and surrounding structures were obtained without and with intravenous contrast. CONTRAST:  7.70mL GADAVIST GADOBUTROL 1 MMOL/ML IV SOLN COMPARISON:  09/30/2019 MRI head and prior. FINDINGS: Brain: Confluent T2/FLAIR hyperintense signal involving the left cerebrum and bifrontal region traversing the genu is less conspicuous than prior exam. Correlative restricted diffusion involving the left periatrial white matter and corpus callosum is grossly unchanged. Prior irregular enhancement predominantly involving the corpus callosum and left greater than right periventricular white matter has decreased (24:27). Persistent leptomeningeal enhancement involving the anterior brainstem, however less conspicuous than prior exam. Prior leptomeningeal enhancement involving the bilateral IAC's is not demonstrated. Persistent ependymal enhancement involving the right atrium. Masslike confluent T2/FLAIR hyperintense signal involving the right corpus callosum body/splenium, centrum semiovale and right corona radiata is more conspicuous than prior exam measuring up to 5.0 x 3.8 cm (17:19, previously 1.7 cm when remeasured). New micronodular enhancement is also seen within this region (24:34, 35). Associated mild restricted diffusion has increased. Masslike confluent T2 hyperintense signal involving the mesial right temporal lobe/hippocampus is also increased with subtle associated heterogenous enhancement (25:14). Associated restricted diffusion has also increased. Redemonstration of left temporal cystic encephalomalacia. Extension of T2 hyperintense signal along the left brainstem is unchanged, likely wallerian degeneration. Leftward midline shift of 2-3 mm is unchanged. Partial effacement of the right lateral ventricle. Increased conspicuity of the right temporal horn may  reflect entrapment. Scattered foci of SWI signal dropout more conspicuous within the left periatrial region. Vascular: Proximally preserved major intracranial flow voids. Skull and upper cervical spine: Sequela of left craniotomy. Sinuses/Orbits: Normal orbits. Pneumatized paranasal sinuses. No mastoid effusion. Other: None. IMPRESSION: Bifrontal and left cerebrum T2 hyperintense signal with associated irregular enhancement is less conspicuous than prior exam. Masslike T2 hyperintense signal involving the right corpus callosum, centrum semiovale and corona radiata is more conspicuous than prior exam with new micronodular enhancement. Masslike T2 hyperintense signal involving the right temporal lobe with associated heterogenous enhancement is increased since prior exam. Anterior brainstem and bilateral IAC leptomeningeal enhancement, less conspicuous than prior exam. Electronically Signed   By: Primitivo Gauze M.D.   On: 11/19/2019 18:47   DG Chest Port 1 View  Result Date: 11/23/2019 CLINICAL DATA:  Altered mental status. EXAM: PORTABLE CHEST 1 VIEW COMPARISON:  Prior chest radiographs 10/15/2018 and earlier. FINDINGS: Shallow inspiration radiograph. Heart size within normal limits. No appreciable airspace consolidation or pulmonary edema. No evidence of pleural effusion or pneumothorax. No acute bony abnormality identified. A thoracolumbar levocurvature may be positional. IMPRESSION: No evidence of acute cardiopulmonary abnormality. Electronically Signed   By: Kellie Simmering DO   On: 11/23/2019 09:48   EEG adult  Result Date: 11/24/2019 Lora Havens, MD     11/24/2019  9:13 AM Patient Name: WYATTE DAMES MRN: [id number] Epilepsy Attending: Lora Havens Referring Physician/Provider: Dr. Heath Lark Date: 11/23/2019 Duration: 22.49 mins Patient history: 24 year old male with history of glioblastoma involving left temporal lobe who presented with altered mental status.  EEG to evaluate for seizures.  Level of alertness: asleep AEDs during EEG study: Keppra, Ativan Technical aspects: This EEG study was done with scalp electrodes positioned according to the 10-20 International system of electrode placement. Electrical activity was acquired at a sampling rate of 500Hz  and reviewed with a high frequency filter of 70Hz  and a low frequency filter of 1Hz . EEG data were recorded continuously and digitally stored. Description:  Sleep was characterized by asymmetric sleep spindles (12 to 14 Hz), L>R, maximal frontocentral region.  EEG showed continuous 3 to 6 Hz theta-delta slowing in left hemisphere with overriding sharply contoured 15-18Hz  beta activity in left frontal region consistent with breach rhythm. Hyperventilation and photic stimulation were not performed.   ABNORMALITY -Continuous slow, left hemisphere -Breach rhythm, left frontal region IMPRESSION: This study showed evidence of cortical dysfunction in left hemisphere, maximal left frontal region consistent with underlying tumor and craniotomy.   No seizures or epileptiform discharges were seen throughout the recording. Lora Havens   DG Lumbar Puncture Fluoro Guide  Result Date: 11/23/2019 CLINICAL DATA:  Fever and worsening mental status. History of brain tumor with prior resection. EXAM: DIAGNOSTIC LUMBAR PUNCTURE UNDER FLUOROSCOPIC GUIDANCE FLUOROSCOPY TIME:  Fluoroscopy Time:  0 minutes, 24 seconds Radiation Exposure Index (if provided by the fluoroscopic device): 3.2 mGy Number of Acquired Spot Images: 0 PROCEDURE: The patient is not able to consent for himself due to altered mental status. I discussed the risks (including hemorrhage, infection, headache, and nerve damage, among others), benefits, and alternatives to fluoroscopically guided lumbar puncture with the patient's mother, who has healthcare power of attorney. We specifically discussed the high technical likelihood of success of the procedure. The patient's mother understood and elected  for the patient to undergo the procedure. Standard time-out was employed. Following sterile skin prep and local anesthetic administration consisting of 1 percent lidocaine, a 22 gauge spinal needle was advanced without difficulty into the thecal sac at the at the L4-5 level under fluoroscopic guidance. Very slightly yellowish tinged CSF was returned. Because of issues with patient agitation, I did not consider it feasible to turn the patient onto his side to obtain an opening pressure measurement. However, CSF flow was mildly sluggish and subjectively I do not feel that there is elevated CSF pressure. 12 cc of CSF was collected. The needle was subsequently removed and the skin cleansed and bandaged. No immediate complications were observed. IMPRESSION: 1. Technically successful fluoroscopically guided lumbar puncture yielding 12 cc of CSF. Electronically Signed   By: Van Clines M.D.   On: 11/23/2019 15:30      Subjective:   Discharge Exam: Vitals:   11/26/19 2128 11/26/19 2230  BP:  (!) 106/54  Pulse:  89  Resp:  17  Temp:  98.7 F (37.1 C)  TempSrc:  Axillary  SpO2: 97% 98%    General: Pt is alert, awake, not in acute distress Cardiovascular: RRR, S1/S2 +, no rubs, no gallops Respiratory: CTA bilaterally, no wheezing, no rhonchi Abdominal: Soft, NT, ND, bowel sounds + Extremities: no edema, no cyanosis    The results of significant diagnostics from this hospitalization (including imaging, microbiology, ancillary and laboratory) are listed below for reference.     Microbiology: Recent Results (from the past 240 hour(s))  Culture, blood (Routine x 2)     Status: None   Collection Time: 11/23/19  9:20 AM   Specimen: BLOOD LEFT HAND  Result Value Ref Range Status   Specimen Description BLOOD LEFT HAND  Final   Special Requests   Final    BOTTLES DRAWN AEROBIC AND ANAEROBIC Blood Culture adequate volume   Culture   Final    NO GROWTH 5 DAYS Performed at [facility], [address], [place], [place] [address]    Report Status 11/28/2019 FINAL  Final  Respiratory Panel by RT PCR (Flu A&B, Covid) - Nasopharyngeal Swab     Status: None  Collection Time: 11/23/19  9:48 AM   Specimen: Nasopharyngeal Swab  Result Value Ref Range Status   SARS Coronavirus 2 by RT PCR NEGATIVE NEGATIVE Final    Comment: (NOTE) SARS-CoV-2 target nucleic acids are NOT DETECTED.  The SARS-CoV-2 RNA is generally detectable in upper respiratoy specimens during the acute phase of infection. The lowest concentration of SARS-CoV-2 viral copies this assay can detect is 131 copies/mL. A negative result does not preclude SARS-Cov-2 infection and should not be used as the sole basis for treatment or other patient management decisions. A negative result may occur with  improper specimen collection/handling, submission of specimen other than nasopharyngeal swab, presence of viral mutation(s) within the areas targeted by this assay, and inadequate number of viral copies (<131 copies/mL). A negative result must be combined with clinical observations, patient history, and epidemiological information. The expected result is Negative.  Fact Sheet for Patients:  PinkCheek.be  Fact Sheet for Healthcare Providers:  GravelBags.it  This test is no t yet approved or cleared by the [place] FDA and  has been authorized for detection and/or diagnosis of SARS-CoV-2 by FDA under an Emergency Use Authorization (EUA). This EUA will remain  in effect (meaning this test can be used) for the duration of the COVID-19 declaration under Section 564(b)(1) of the Act, 21 U.S.C. section 360bbb-3(b)(1), unless the authorization is terminated or revoked sooner.     Influenza A by PCR NEGATIVE NEGATIVE Final   Influenza B by PCR NEGATIVE NEGATIVE Final    Comment: (NOTE) The Xpert Xpress SARS-CoV-2/FLU/RSV assay is intended as an aid in   the diagnosis of influenza from Nasopharyngeal swab specimens and  should not be used as a sole basis for treatment. Nasal washings and  aspirates are unacceptable for Xpert Xpress SARS-CoV-2/FLU/RSV  testing.  Fact Sheet for Patients: PinkCheek.be  Fact Sheet for Healthcare Providers: GravelBags.it  This test is not yet approved or cleared by the [place] FDA and  has been authorized for detection and/or diagnosis of SARS-CoV-2 by  FDA under an Emergency Use Authorization (EUA). This EUA will remain  in effect (meaning this test can be used) for the duration of the  Covid-19 declaration under Section 564(b)(1) of the Act, 21  U.S.C. section 360bbb-3(b)(1), unless the authorization is  terminated or revoked. Performed at [facility], [address], [place], [place] [address]   Culture, blood (Routine x 2)     Status: None   Collection Time: 11/23/19 10:17 AM   Specimen: BLOOD  Result Value Ref Range Status   Specimen Description BLOOD RIGHT ANTECUBITAL  Final   Special Requests   Final    BOTTLES DRAWN AEROBIC AND ANAEROBIC Blood Culture adequate volume   Culture   Final    NO GROWTH 5 DAYS Performed at [facility], [address] [place], [place] [address]    Report Status 11/28/2019 FINAL  Final  CSF culture with Stat gram stain     Status: None   Collection Time: 11/23/19  2:32 PM   Specimen: CSF; Cerebrospinal Fluid  Result Value Ref Range Status   Specimen Description   Final    CSF Performed at [facility], [address], [place], [place] [address]    Special Requests   Final    Immunocompromised Performed at [facility], [address] [place],  [address]    Gram Stain NO WBC SEEN NO ORGANISMS SEEN CYTOSPIN SMEAR   Final   Culture   Final  NO GROWTH 3 DAYS Performed at [facility], [place] [address], [place], [place] [address]    Report Status 11/27/2019 FINAL  Final  SARS  Coronavirus 2 by RT PCR (hospital order, performed in [facility] hospital lab) Nasopharyngeal Nasopharyngeal Swab     Status: None   Collection Time: 11/28/19  9:22 AM   Specimen: Nasopharyngeal Swab  Result Value Ref Range Status   SARS Coronavirus 2 NEGATIVE NEGATIVE Final    Comment: (NOTE) SARS-CoV-2 target nucleic acids are NOT DETECTED.  The SARS-CoV-2 RNA is generally detectable in upper and lower respiratory specimens during the acute phase of infection. The lowest concentration of SARS-CoV-2 viral copies this assay can detect is 250 copies / mL. A negative result does not preclude SARS-CoV-2 infection and should not be used as the sole basis for treatment or other patient management decisions.  A negative result may occur with improper specimen collection / handling, submission of specimen other than nasopharyngeal swab, presence of viral mutation(s) within the areas targeted by this assay, and inadequate number of viral copies (<250 copies / mL). A negative result must be combined with clinical observations, patient history, and epidemiological information.  Fact Sheet for Patients:   StrictlyIdeas.no  Fact Sheet for Healthcare Providers: BankingDealers.co.za  This test is not yet approved or  cleared by the [place] FDA and has been authorized for detection and/or diagnosis of SARS-CoV-2 by FDA under an Emergency Use Authorization (EUA).  This EUA will remain in effect (meaning this test can be used) for the duration of the COVID-19 declaration under Section 564(b)(1) of the Act, 21 U.S.C. section 360bbb-3(b)(1), unless the authorization is terminated or revoked sooner.  Performed at [facility], [address], [place],  [address]      Labs: BNP (last 3 results) No results for input(s): BNP in the last 8760 hours. Basic Metabolic Panel: Recent Labs  Lab 11/23/19 0948 11/24/19 0638 11/25/19 0513  NA  138 138 136  K 3.4* 3.6 3.3*  CL 102 102 100  CO2 25 26 26   GLUCOSE 97 88 106*  BUN 20 14 9   CREATININE 0.90 0.72 0.67  CALCIUM 9.1 8.7* 8.5*  MG  --  1.9  --    Liver Function Tests: Recent Labs  Lab 11/23/19 0948  AST 24  ALT 28  ALKPHOS 42  BILITOT 0.8  PROT 6.6  ALBUMIN 3.9   No results for input(s): LIPASE, AMYLASE in the last 168 hours. No results for input(s): AMMONIA in the last 168 hours. CBC: Recent Labs  Lab 11/23/19 0948 11/24/19 0638 11/25/19 0513  WBC 13.6* 8.1 7.8  NEUTROABS 11.7*  --   --   HGB 15.1 13.1 12.4*  HCT 44.6 40.7 37.5*  MCV 90.8 94.4 93.5  PLT 143* 123* 123*   Cardiac Enzymes: No results for input(s): CKTOTAL, CKMB, CKMBINDEX, TROPONINI in the last 168 hours. BNP: Invalid input(s): POCBNP CBG: Recent Labs  Lab 11/25/19 0834 11/25/19 1119 11/25/19 1642 11/26/19 1613 11/27/19 1136  GLUCAP 102* 78 120* 103* 68*   D-Dimer No results for input(s): DDIMER in the last 72 hours. Hgb A1c No results for input(s): HGBA1C in the last 72 hours. Lipid Profile No results for input(s): CHOL, HDL, LDLCALC, TRIG, CHOLHDL, LDLDIRECT in the last 72 hours. Thyroid function studies No results for input(s): TSH, T4TOTAL, T3FREE, THYROIDAB in the last 72 hours.  Invalid input(s): FREET3 Anemia work up No results for input(s): VITAMINB12, FOLATE, FERRITIN, TIBC, IRON, RETICCTPCT in  the last 72 hours. Urinalysis    Component Value Date/Time   COLORURINE YELLOW 11/23/2019 [place] 11/23/2019 0924   LABSPEC 1.021 11/23/2019 0924   PHURINE 5.0 11/23/2019 0924   GLUCOSEU NEGATIVE 11/23/2019 0924   HGBUR NEGATIVE 11/23/2019 0924   BILIRUBINUR NEGATIVE 11/23/2019 [place] 11/23/2019 0924   PROTEINUR 30 (A) 11/23/2019 0924   NITRITE NEGATIVE 11/23/2019 0924   LEUKOCYTESUR NEGATIVE 11/23/2019 0924   Sepsis Labs Invalid input(s): PROCALCITONIN,  WBC,  LACTICIDVEN Microbiology Recent Results (from the past 240  hour(s))  Culture, blood (Routine x 2)     Status: None   Collection Time: 11/23/19  9:20 AM   Specimen: BLOOD LEFT HAND  Result Value Ref Range Status   Specimen Description BLOOD LEFT HAND  Final   Special Requests   Final    BOTTLES DRAWN AEROBIC AND ANAEROBIC Blood Culture adequate volume   Culture   Final    NO GROWTH 5 DAYS Performed at [facility], [address] [place], [place] [address]    Report Status 11/28/2019 FINAL  Final  Respiratory Panel by RT PCR (Flu A&B, Covid) - Nasopharyngeal Swab     Status: None   Collection Time: 11/23/19  9:48 AM   Specimen: Nasopharyngeal Swab  Result Value Ref Range Status   SARS Coronavirus 2 by RT PCR NEGATIVE NEGATIVE Final    Comment: (NOTE) SARS-CoV-2 target nucleic acids are NOT DETECTED.  The SARS-CoV-2 RNA is generally detectable in upper respiratoy specimens during the acute phase of infection. The lowest concentration of SARS-CoV-2 viral copies this assay can detect is 131 copies/mL. A negative result does not preclude SARS-Cov-2 infection and should not be used as the sole basis for treatment or other patient management decisions. A negative result may occur with  improper specimen collection/handling, submission of specimen other than nasopharyngeal swab, presence of viral mutation(s) within the areas targeted by this assay, and inadequate number of viral copies (<131 copies/mL). A negative result must be combined with clinical observations, patient history, and epidemiological information. The expected result is Negative.  Fact Sheet for Patients:  PinkCheek.be  Fact Sheet for Healthcare Providers:  GravelBags.it  This test is no t yet approved or cleared by the [place] FDA and  has been authorized for detection and/or diagnosis of SARS-CoV-2 by FDA under an Emergency Use Authorization (EUA). This EUA will remain  in effect (meaning this test can be  used) for the duration of the COVID-19 declaration under Section 564(b)(1) of the Act, 21 U.S.C. section 360bbb-3(b)(1), unless the authorization is terminated or revoked sooner.     Influenza A by PCR NEGATIVE NEGATIVE Final   Influenza B by PCR NEGATIVE NEGATIVE Final    Comment: (NOTE) The Xpert Xpress SARS-CoV-2/FLU/RSV assay is intended as an aid in  the diagnosis of influenza from Nasopharyngeal swab specimens and  should not be used as a sole basis for treatment. Nasal washings and  aspirates are unacceptable for Xpert Xpress SARS-CoV-2/FLU/RSV  testing.  Fact Sheet for Patients: PinkCheek.be  Fact Sheet for Healthcare Providers: GravelBags.it  This test is not yet approved or cleared by the [place] FDA and  has been authorized for detection and/or diagnosis of SARS-CoV-2 by  FDA under an Emergency Use Authorization (EUA). This EUA will remain  in effect (meaning this test can be used) for the duration of the  Covid-19 declaration under Section 564(b)(1) of the Act, 21  U.S.C. section 360bbb-3(b)(1), unless the  authorization is  terminated or revoked. Performed at [facility], [address] [place], [place] [address]   Culture, blood (Routine x 2)     Status: None   Collection Time: 11/23/19 10:17 AM   Specimen: BLOOD  Result Value Ref Range Status   Specimen Description BLOOD RIGHT ANTECUBITAL  Final   Special Requests   Final    BOTTLES DRAWN AEROBIC AND ANAEROBIC Blood Culture adequate volume   Culture   Final    NO GROWTH 5 DAYS Performed at [facility], [address] [place], [place] [address]    Report Status 11/28/2019 FINAL  Final  CSF culture with Stat gram stain     Status: None   Collection Time: 11/23/19  2:32 PM   Specimen: CSF; Cerebrospinal Fluid  Result Value Ref Range Status   Specimen Description   Final    CSF Performed at [facility], [address], [place], [place]  [address]    Special Requests   Final    Immunocompromised Performed at [facility], [address] [place], [place] [address]    Gram Stain NO WBC SEEN NO ORGANISMS SEEN CYTOSPIN SMEAR   Final   Culture   Final    NO GROWTH 3 DAYS Performed at [facility], [place] [address], [place], [place] [address]    Report Status 11/27/2019 FINAL  Final  SARS Coronavirus 2 by RT PCR (hospital order, performed in [facility] hospital lab) Nasopharyngeal Nasopharyngeal Swab     Status: None   Collection Time: 11/28/19  9:22 AM   Specimen: Nasopharyngeal Swab  Result Value Ref Range Status   SARS Coronavirus 2 NEGATIVE NEGATIVE Final    Comment: (NOTE) SARS-CoV-2 target nucleic acids are NOT DETECTED.  The SARS-CoV-2 RNA is generally detectable in upper and lower respiratory specimens during the acute phase of infection. The lowest concentration of SARS-CoV-2 viral copies this assay can detect is 250 copies / mL. A negative result does not preclude SARS-CoV-2 infection and should not be used as the sole basis for treatment or other patient management decisions.  A negative result may occur with improper specimen collection / handling, submission of specimen other than nasopharyngeal swab, presence of viral mutation(s) within the areas targeted by this assay, and inadequate number of viral copies (<250 copies / mL). A negative result must be combined with clinical observations, patient history, and epidemiological information.  Fact Sheet for Patients:   StrictlyIdeas.no  Fact Sheet for Healthcare Providers: BankingDealers.co.za  This test is not yet approved or  cleared by the [place] FDA and has been authorized for detection and/or diagnosis of SARS-CoV-2 by FDA under an Emergency Use Authorization (EUA).  This EUA will remain in effect (meaning this test can be used) for the duration of the COVID-19 declaration under Section  564(b)(1) of the Act, 21 U.S.C. section 360bbb-3(b)(1), unless the authorization is terminated or revoked sooner.  Performed at [facility], [address], [place], [place] [address]      Time coordinating discharge: 58mins  SIGNED:   Kathie Dike, MD  [facility] 11/28/2019, 4:37 PM   If 7PM-7AM, please contact night-coverage [email or web address]

## 2019-11-28 NOTE — Progress Notes (Signed)
Wasted 42mls of morphine with Cydney Ok into stericycle.

## 2019-11-28 NOTE — Progress Notes (Signed)
Called report to [place] at [facility]. [facility] came to pick patient up to transport him to Hosp[facility].

## 2019-11-28 NOTE — Plan of Care (Signed)
  Problem: Education: Goal: Knowledge of General Education information will improve Description: Including pain rating scale, medication(s)/side effects and non-pharmacologic comfort measures Outcome: Not Progressing   

## 2019-11-30 ENCOUNTER — Inpatient Hospital Stay: Payer: 59 | Admitting: Internal Medicine

## 2019-11-30 ENCOUNTER — Inpatient Hospital Stay: Payer: 59

## 2019-12-16 DEATH — deceased

## 2020-01-19 ENCOUNTER — Other Ambulatory Visit: Payer: Self-pay | Admitting: Internal Medicine

## 2020-04-02 IMAGING — MR MR HEAD WO/W CM
11 of 14 series · 27 of 48 positions shown · IV contrast (gadavist)
Comparison: Brain MRI examinations 10/14/2018 and earlier, head CT
examinations 10/14/2018 and earlier

CLINICAL DATA: Ped, neoplasm; CNS primary, follow-up. Additional
history provided: Right greater than left-sided weakness secondary
to intercerebral hemorrhage with GBM.

EXAM:
MRI HEAD WITHOUT AND WITH CONTRAST
TECHNIQUE: Multiplanar, multiecho pulse sequences of the brain and surrounding
structures were obtained without and with intravenous contrast.
CONTRAST:  7mL GADAVIST GADOBUTROL 1 MMOL/ML IV SOLN

[Series 2: DWI · axial · 3.0mm · 0.94mm/px · z∈[-95,+54]mm · 5 of 106 slices shown (1 of 2)]
[im 1/106]
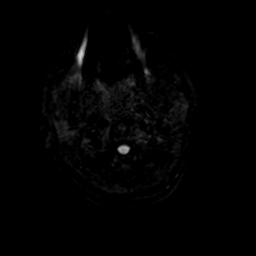
[im 27/106]
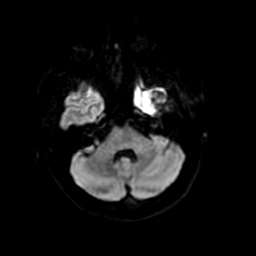
[im 53/106]
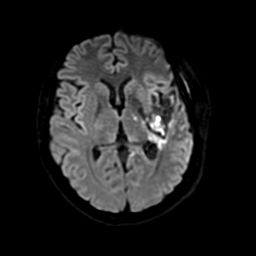
[im 79/106]
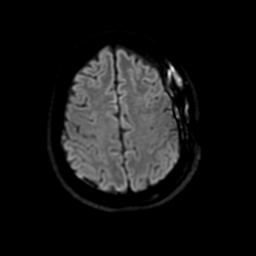
[im 106/106]
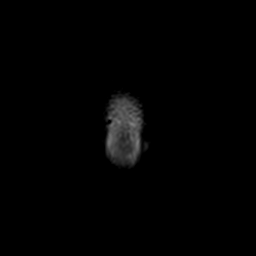

[Series 3: FLAIR · axial · 5.0mm · 0.47mm/px · 1 of 27 slices shown (1 of 2)]
[im 1/27]
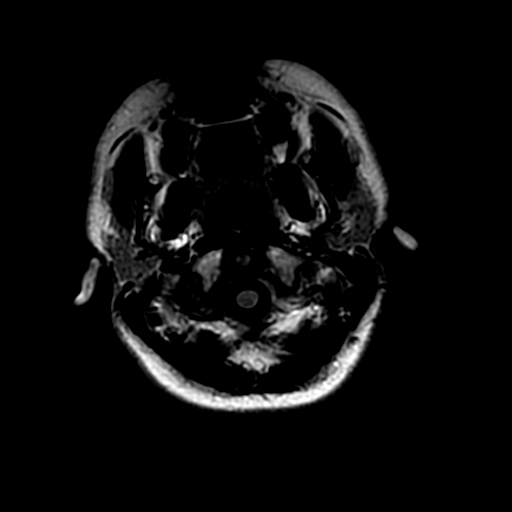

[Series 4: SWI · axial · 3.0mm · 0.47mm/px · z∈[-96,+25]mm · 5 of 108 slices shown]
[im 1/108]
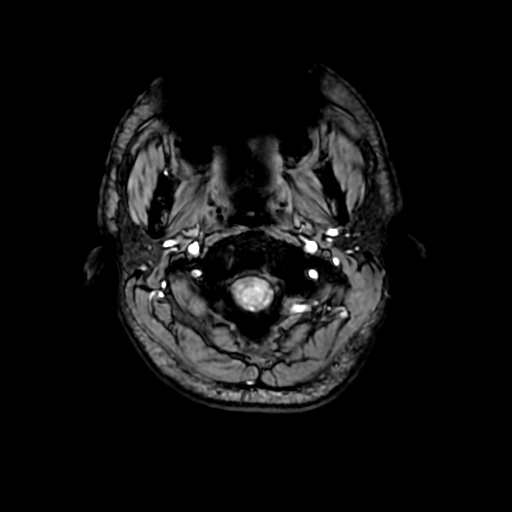
[im 22/108]
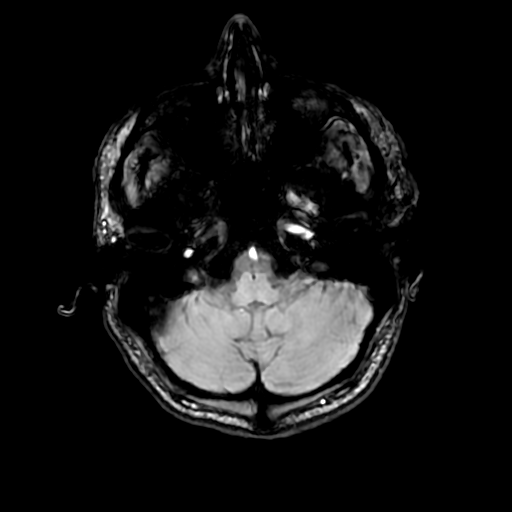
[im 43/108]
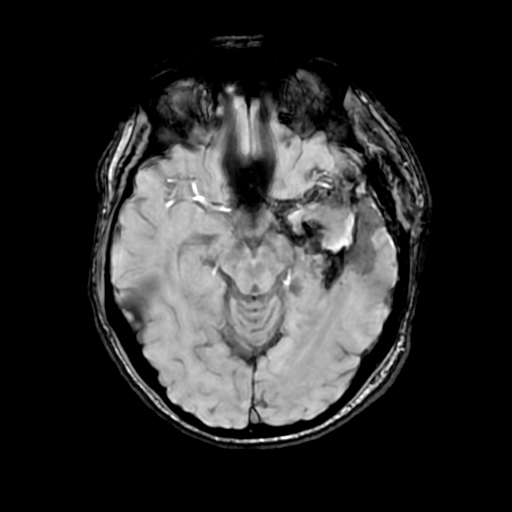
[im 65/108]
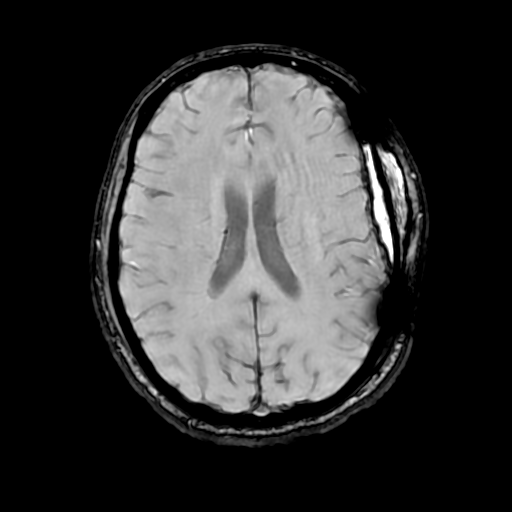
[im 86/108]
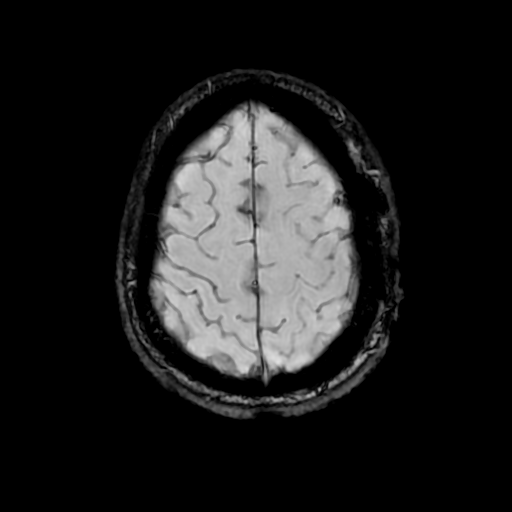

[Series 5: DWI · coronal · 4.0mm · 0.94mm/px · 4 of 74 slices shown (2 of 2)]
[im 1/74]
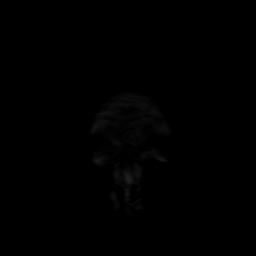
[im 25/74]
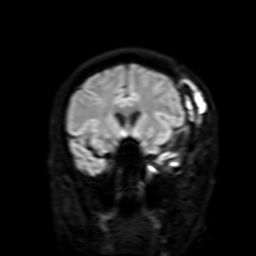
[im 49/74]
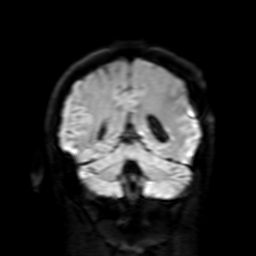
[im 74/74]
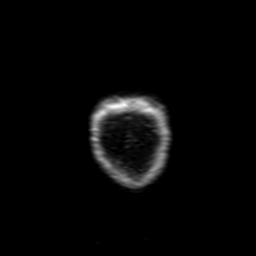

[Series 6: FLAIR · sagittal · 5.0mm · 0.47mm/px · 1 of 25 slices shown (2 of 2)]
[im 1/25]
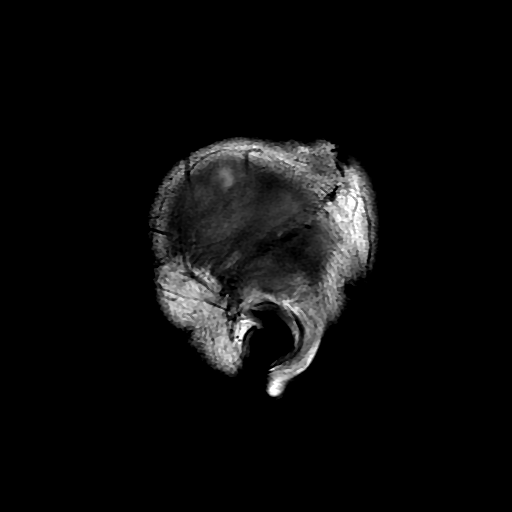

[Series 7: T2 · axial · 5.0mm · 0.47mm/px · 1 of 27 slices shown (1 of 2)]
[im 1/27]
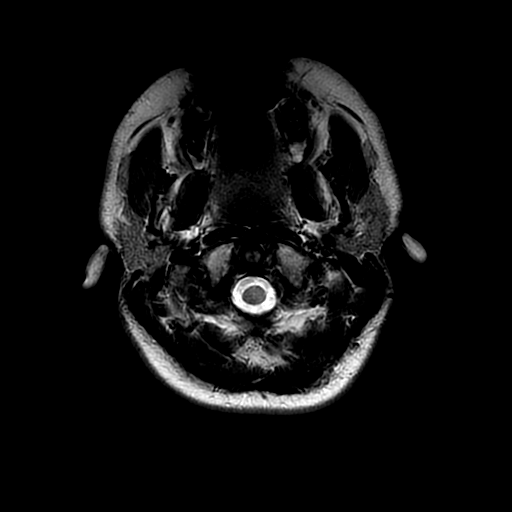

[Series 9: T2 · coronal · 5.0mm · 0.47mm/px · 2 of 31 slices shown (2 of 2)]
[im 1/31]
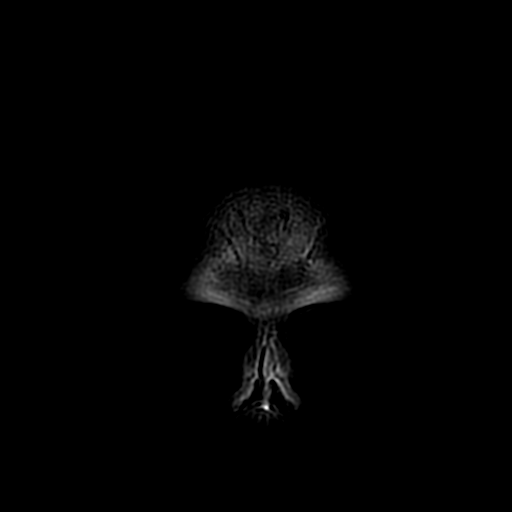
[im 31/31]
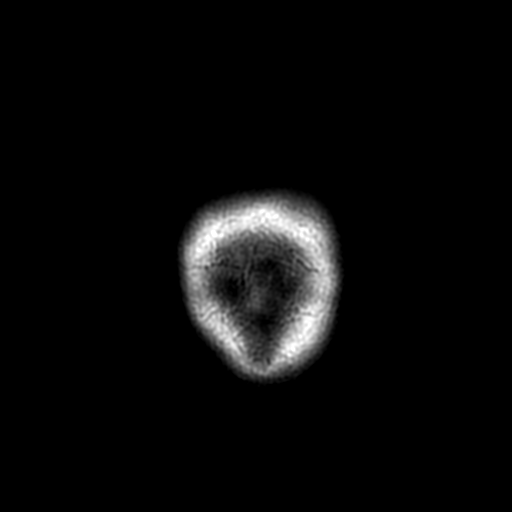

[Series 11: T1 post-contrast · coronal · 5.0mm · 0.43mm/px · 2 of 31 slices shown]
[im 1/31]
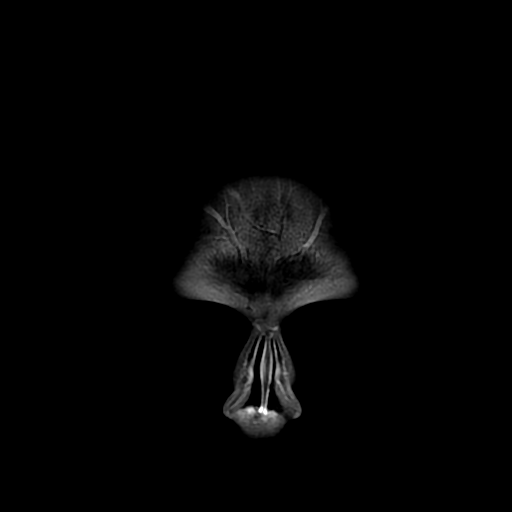
[im 31/31]
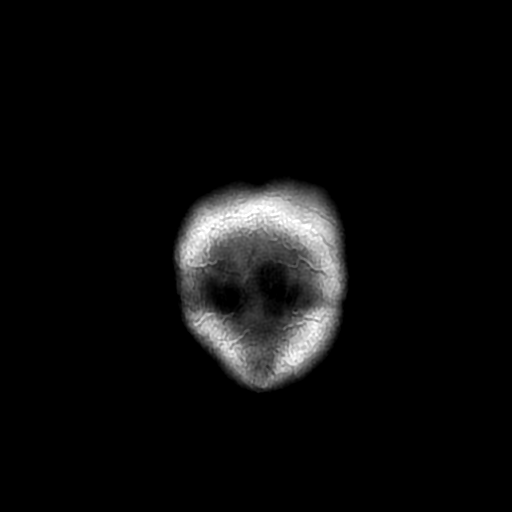

[Series 12: FLAIR post-contrast · sagittal · 5.0mm · 0.47mm/px · 1 of 25 slices shown]
[im 1/25]
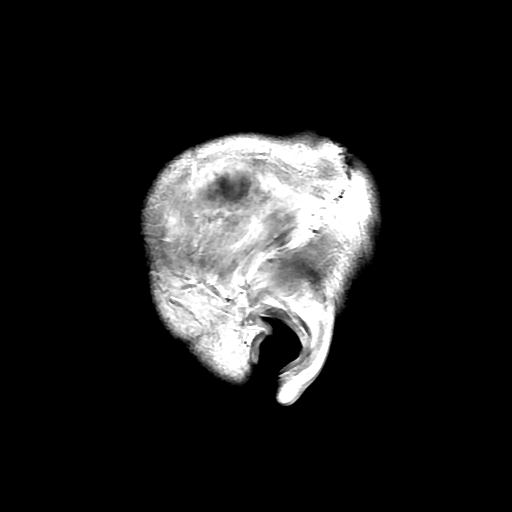

[Series 250: ADC · axial · 3.0mm · 0.94mm/px · z∈[-95,+54]mm · 3 of 53 slices shown (1 of 2)]
[im 1/53]
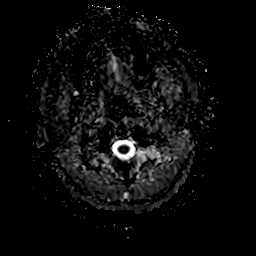
[im 27/53]
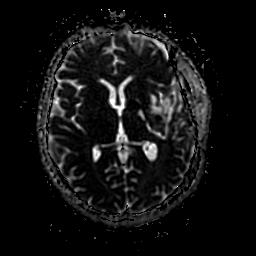
[im 53/53]
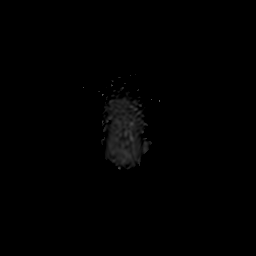

[Series 550: ADC · coronal · 4.0mm · 0.94mm/px · 2 of 37 slices shown (2 of 2)]
[im 1/37]
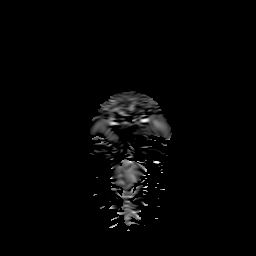
[im 37/37]
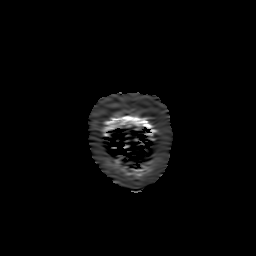

[27 of 48 positions shown; findings below may reference images not displayed]

FINDINGS: Brain:

There has been continued interval evolution of postoperative changes
related to prior left temporal hematoma decompression and tumor
resection. Persistent nonacute blood products within and along the
resection/hematoma cavity. Parenchymal edema surrounding the
resection cavity has significantly decreased. There are persistent
areas of diffusion weighted signal abnormality along portions of the
resection cavity which may reflect evolving
postoperative/post-ischemic change, although attention is
recommended on follow-up imaging to exclude nonenhancing
infiltrative tumor. These areas are present along the anteromedial
aspect of the resection cavity within the inferior left basal
ganglia, anteroinferior left frontal lobe and left subinsular
region. These areas are also within the medial left thalamus and
within the left temporal stem.

There is a persistent small focus of restricted diffusion within the
splenium of the corpus callosum which is favored post ischemic.

There is nonspecific curvilinear and slightly irregular enhancement
surrounding the hematoma/resection cavity. There is parenchymal
edema and gyriform enhancement within portions of the left temporal
and occipital lobes. This largely corresponds with regions of
previously demonstrated ischemia. Some of the areas of edema and
gyriform enhancement within the posterior left temporal lobe and
occipital lobe do not correspond with sites of previously
demonstrated ischemia, although are suspicious for interval ischemia
(now nonacute).

Encephalomalacia within the left midbrain. Small chronic infarcts
within the paramedian left parietal lobe and within the left fornix.

Significant interval decrease in mass effect. No midline shift
persists.

An extra-axial collection deep to the cranioplasty has not
significantly changed, again measuring 8 mm in width. Persistent
small fluid collection overlying the cranioplasty.

Vascular: Flow voids maintained within the proximal large arterial
vessels.

Skull and upper cervical spine: No focal marrow lesion. Left-sided
cranioplasty

Sinuses/Orbits: Visualized orbits demonstrate no acute abnormality.
Trace ethmoid sinus mucosal thickening. No significant mastoid
effusion
IMPRESSION: 1. Interval evolution of postoperative changes related to previous
left temporal hematoma decompression and tumor resection as
detailed.
2. Regions of persistent diffusion-weighted signal abnormality along
portions of the resection cavity which may reflect evolving
postoperative/post-ischemic change, although close imaging follow-up
is recommended to exclude nonenhancing infiltrative tumor.
3. Nonspecific curvilinear and slightly irregular enhancement
surrounding the resection/hematoma cavity. Attention recommended on
follow-up.
4. Redemonstrated multifocal post ischemic changes. Areas of edema
and gyriform enhancement within portions of the posterior left
temporal lobe and occipital lobe do not correspond with sites of
previously demonstrated ischemia, although are suspicious for
interval ischemia (now non-acute).
5. Significant interval decrease in mass effect. No midline shift
persists.
6. Unchanged extra-axial collection deep to the cranioplasty
measuring 8 mm in thickness.
